# Patient Record
Sex: Male | Born: 1970
Health system: Southern US, Community
[De-identification: ages and names within clinical notes are randomized; demographics above are authoritative.]

## PROBLEM LIST (undated history)

## (undated) ENCOUNTER — Ambulatory Visit: Admission: RE | Discharge status: Absent without leave | Payer: BLUE CROSS/BLUE SHIELD | Source: Ambulatory Visit

## (undated) DIAGNOSIS — I10 Essential (primary) hypertension: Secondary | ICD-10-CM

## (undated) DIAGNOSIS — J189 Pneumonia, unspecified organism: Secondary | ICD-10-CM

## (undated) DIAGNOSIS — I219 Acute myocardial infarction, unspecified: Secondary | ICD-10-CM

## (undated) DIAGNOSIS — I2510 Atherosclerotic heart disease of native coronary artery without angina pectoris: Secondary | ICD-10-CM

## (undated) DIAGNOSIS — I639 Cerebral infarction, unspecified: Secondary | ICD-10-CM

## (undated) HISTORY — DX: Cerebral infarction, unspecified: I63.9

## (undated) HISTORY — DX: Atherosclerotic heart disease of native coronary artery without angina pectoris: I25.10

---

## 2006-01-30 ENCOUNTER — Emergency Department (HOSPITAL_COMMUNITY): Admission: EM | Admit: 2006-01-30 | Discharge: 2006-01-30 | Payer: Self-pay | Admitting: Emergency Medicine

## 2006-12-23 ENCOUNTER — Emergency Department (HOSPITAL_COMMUNITY): Admission: EM | Admit: 2006-12-23 | Discharge: 2006-12-23 | Payer: Self-pay | Admitting: Emergency Medicine

## 2007-04-04 ENCOUNTER — Emergency Department (HOSPITAL_COMMUNITY): Admission: EM | Admit: 2007-04-04 | Discharge: 2007-04-04 | Payer: Self-pay | Admitting: Emergency Medicine

## 2018-03-30 ENCOUNTER — Inpatient Hospital Stay (HOSPITAL_COMMUNITY)
Admission: EM | Admit: 2018-03-30 | Discharge: 2018-04-02 | DRG: 194 | Disposition: A | Discharge status: Home / Self Care | Payer: BLUE CROSS/BLUE SHIELD | Attending: Family Medicine | Admitting: Family Medicine

## 2018-03-30 ENCOUNTER — Encounter (HOSPITAL_COMMUNITY): Payer: Self-pay | Admitting: Emergency Medicine

## 2018-03-30 ENCOUNTER — Other Ambulatory Visit: Payer: Self-pay

## 2018-03-30 ENCOUNTER — Emergency Department (HOSPITAL_COMMUNITY): Payer: BLUE CROSS/BLUE SHIELD

## 2018-03-30 DIAGNOSIS — D649 Anemia, unspecified: Secondary | ICD-10-CM | POA: Diagnosis not present

## 2018-03-30 DIAGNOSIS — N183 Chronic kidney disease, stage 3 unspecified: Secondary | ICD-10-CM | POA: Diagnosis present

## 2018-03-30 DIAGNOSIS — E559 Vitamin D deficiency, unspecified: Secondary | ICD-10-CM | POA: Diagnosis not present

## 2018-03-30 DIAGNOSIS — N179 Acute kidney failure, unspecified: Secondary | ICD-10-CM | POA: Diagnosis present

## 2018-03-30 DIAGNOSIS — E872 Acidosis, unspecified: Secondary | ICD-10-CM | POA: Diagnosis present

## 2018-03-30 DIAGNOSIS — R809 Proteinuria, unspecified: Secondary | ICD-10-CM | POA: Diagnosis not present

## 2018-03-30 DIAGNOSIS — E860 Dehydration: Secondary | ICD-10-CM | POA: Diagnosis present

## 2018-03-30 DIAGNOSIS — R112 Nausea with vomiting, unspecified: Secondary | ICD-10-CM | POA: Diagnosis present

## 2018-03-30 DIAGNOSIS — I129 Hypertensive chronic kidney disease with stage 1 through stage 4 chronic kidney disease, or unspecified chronic kidney disease: Secondary | ICD-10-CM | POA: Diagnosis not present

## 2018-03-30 DIAGNOSIS — N184 Chronic kidney disease, stage 4 (severe): Secondary | ICD-10-CM | POA: Diagnosis present

## 2018-03-30 DIAGNOSIS — J189 Pneumonia, unspecified organism: Secondary | ICD-10-CM

## 2018-03-30 DIAGNOSIS — E871 Hypo-osmolality and hyponatremia: Secondary | ICD-10-CM | POA: Diagnosis not present

## 2018-03-30 DIAGNOSIS — J181 Lobar pneumonia, unspecified organism: Secondary | ICD-10-CM | POA: Diagnosis not present

## 2018-03-30 DIAGNOSIS — F17210 Nicotine dependence, cigarettes, uncomplicated: Secondary | ICD-10-CM | POA: Diagnosis present

## 2018-03-30 DIAGNOSIS — I10 Essential (primary) hypertension: Secondary | ICD-10-CM | POA: Diagnosis present

## 2018-03-30 LAB — COMPREHENSIVE METABOLIC PANEL
ALT: 51 U/L (ref 17–63)
ANION GAP: 18 — AB (ref 5–15)
AST: 75 U/L — ABNORMAL HIGH (ref 15–41)
Albumin: 3.5 g/dL (ref 3.5–5.0)
Alkaline Phosphatase: 81 U/L (ref 38–126)
BILIRUBIN TOTAL: 0.6 mg/dL (ref 0.3–1.2)
BUN: 66 mg/dL — ABNORMAL HIGH (ref 6–20)
CO2: 15 mmol/L — ABNORMAL LOW (ref 22–32)
Calcium: 9.2 mg/dL (ref 8.9–10.3)
Chloride: 97 mmol/L — ABNORMAL LOW (ref 101–111)
Creatinine, Ser: 5.87 mg/dL — ABNORMAL HIGH (ref 0.61–1.24)
GFR calc Af Amer: 12 mL/min — ABNORMAL LOW (ref >60)
GFR calc non Af Amer: 10 mL/min — ABNORMAL LOW (ref >60)
Glucose, Bld: 118 mg/dL — ABNORMAL HIGH (ref 65–99)
POTASSIUM: 3.7 mmol/L (ref 3.5–5.1)
SODIUM: 130 mmol/L — AB (ref 135–145)
TOTAL PROTEIN: 9.7 g/dL — AB (ref 6.5–8.1)

## 2018-03-30 LAB — LACTIC ACID, PLASMA
Lactic Acid, Venous: 1.6 mmol/L (ref 0.5–1.9)
Lactic Acid, Venous: 0.9 mmol/L (ref 0.5–1.9)

## 2018-03-30 LAB — CBC WITH DIFFERENTIAL/PLATELET
BASOS ABS: 0 10*3/uL (ref 0.0–0.1)
BASOS PCT: 0 %
EOS ABS: 0 10*3/uL (ref 0.0–0.7)
EOS PCT: 0 %
HEMATOCRIT: 43.7 % (ref 39.0–52.0)
Hemoglobin: 15.6 g/dL (ref 13.0–17.0)
Lymphocytes Relative: 16 %
Lymphs Abs: 2.6 10*3/uL (ref 0.7–4.0)
MCH: 32.5 pg (ref 26.0–34.0)
MCHC: 35.7 g/dL (ref 30.0–36.0)
MCV: 91 fL (ref 78.0–100.0)
Monocytes Absolute: 0.9 10*3/uL (ref 0.1–1.0)
Monocytes Relative: 5 %
Neutro Abs: 12.6 10*3/uL (ref 1.7–7.7)
Neutrophils Relative %: 79 %
PLATELETS: 133 10*3/uL — AB (ref 150–400)
RBC: 4.8 MIL/uL (ref 4.22–5.81)
RDW: 13.6 % (ref 11.5–15.5)
WBC: 16.1 10*3/uL — ABNORMAL HIGH (ref 4.0–10.5)

## 2018-03-30 LAB — BASIC METABOLIC PANEL
Anion gap: 14 (ref 5–15)
BUN: 68 mg/dL — ABNORMAL HIGH (ref 6–20)
CHLORIDE: 102 mmol/L (ref 101–111)
CO2: 14 mmol/L — ABNORMAL LOW (ref 22–32)
Calcium: 8.5 mg/dL — ABNORMAL LOW (ref 8.9–10.3)
Creatinine, Ser: 5.34 mg/dL — ABNORMAL HIGH (ref 0.61–1.24)
GFR calc Af Amer: 13 mL/min — ABNORMAL LOW (ref >60)
GFR calc non Af Amer: 12 mL/min — ABNORMAL LOW (ref >60)
GLUCOSE: 117 mg/dL — AB (ref 65–99)
POTASSIUM: 3.7 mmol/L (ref 3.5–5.1)
SODIUM: 130 mmol/L — AB (ref 135–145)

## 2018-03-30 LAB — BETA-HYDROXYBUTYRIC ACID: BETA-HYDROXYBUTYRIC ACID: 0.21 mmol/L (ref 0.05–0.27)

## 2018-03-30 MED ORDER — AZITHROMYCIN 250 MG PO TABS
500.0000 mg | ORAL_TABLET | Freq: Once | ORAL | Status: AC
  Administered 2018-03-30: 500 mg via ORAL
  Filled 2018-03-30: qty 2

## 2018-03-30 MED ORDER — ENOXAPARIN SODIUM 60 MG/0.6ML ~~LOC~~ SOLN
60.0000 mg | SUBCUTANEOUS | Status: DC
  Administered 2018-03-30: 60 mg via SUBCUTANEOUS
  Filled 2018-03-30: qty 0.6

## 2018-03-30 MED ORDER — METOPROLOL TARTRATE 25 MG PO TABS
25.0000 mg | ORAL_TABLET | Freq: Two times a day (BID) | ORAL | Status: DC
  Administered 2018-03-30 – 2018-03-31 (×3): 25 mg via ORAL
  Filled 2018-03-30 (×3): qty 1

## 2018-03-30 MED ORDER — PNEUMOCOCCAL VAC POLYVALENT 25 MCG/0.5ML IJ INJ
0.5000 mL | INJECTION | INTRAMUSCULAR | Status: DC
  Filled 2018-03-30: qty 0.5

## 2018-03-30 MED ORDER — SODIUM CHLORIDE 0.9 % IV BOLUS
1000.0000 mL | Freq: Once | INTRAVENOUS | Status: AC
  Administered 2018-03-30: 1000 mL via INTRAVENOUS

## 2018-03-30 MED ORDER — SODIUM CHLORIDE 0.9 % IV SOLN
1.0000 g | Freq: Once | INTRAVENOUS | Status: AC
  Administered 2018-03-30: 1 g via INTRAVENOUS
  Filled 2018-03-30: qty 10

## 2018-03-30 MED ORDER — SODIUM CHLORIDE 0.9 % IV SOLN
1.0000 g | INTRAVENOUS | Status: DC
  Administered 2018-03-31: 1 g via INTRAVENOUS
  Filled 2018-03-30 (×2): qty 10
  Filled 2018-03-30: qty 1

## 2018-03-30 MED ORDER — SODIUM CHLORIDE 0.9 % IV SOLN
500.0000 mg | INTRAVENOUS | Status: DC
  Administered 2018-03-31: 500 mg via INTRAVENOUS
  Filled 2018-03-30 (×3): qty 500

## 2018-03-30 MED ORDER — SODIUM CHLORIDE 0.9 % IV SOLN
INTRAVENOUS | Status: DC
  Administered 2018-03-30 – 2018-04-02 (×6): via INTRAVENOUS

## 2018-03-30 MED ORDER — METOPROLOL TARTRATE 25 MG PO TABS: 25.0000 mg | ORAL_TABLET | Freq: Two times a day (BID) | ORAL | Status: DC

## 2018-03-30 NOTE — H&P (Signed)
History and Physical  Zachary Mccann:[id number] DOB: August 20, 1971 DOA: 03/30/2018  Referring physician: Dr Aileen Pilot, ED physician PCP: Patient, No Pcp Per  Outpatient Specialists: none  Patient Coming From: home  Chief Complaint: cough, fever  HPI: Zachary Mccann is a 47 y.o. male with a history of hypertension, although he has not been to see a physician and has not been treated for this.  Patient presents with 4 days of worsening cough, fever, with intermittent nausea vomiting and diarrhea.  He has been able to keep very little down.  Eating has been exacerbating his nausea and vomiting.  He has been able to tolerate some sips of fluids earlier today.  No other palliating or provoking factors.  Symptoms are worsening.  Emergency Department Course: Patient received IV antibiotics: Ceftriaxone and azithromycin for pneumonia evident on chest x-ray: Left upper and left lower lobe pneumonias.  White count was 16,000.  Creatinine 5.87 bicarb 15 anion gap 18.  Review of Systems:   Pt denies any constipation, abdominal pain, shortness of breath, dyspnea on exertion, orthopnea, wheezing, palpitations, headache, vision changes, lightheadedness, dizziness, melena, rectal bleeding.  Review of systems are otherwise negative  History reviewed. No pertinent past medical history. History reviewed. No pertinent surgical history. Social History:  reports that he has been smoking cigarettes.  He has been smoking about 0.50 packs per day. He has never used smokeless tobacco. He reports that he drinks alcohol. He reports that he has current or past drug history. Patient lives at home  No Known Allergies  History reviewed. No pertinent family history.  Patient is unaware of any family history  Prior to Admission medications   Medication Sig Start Date End Date Taking? Authorizing Provider  ibuprofen (ADVIL,MOTRIN) 200 MG tablet Take 400 mg by mouth daily as needed for moderate pain.   Yes [provider]    Physical Exam: BP (!) 167/113   Pulse 100   Temp 99.5 F (37.5 C) (Oral)   Resp (!) 25   Ht 6' (1.829 m)   Wt 127 kg (280 lb)   SpO2 95%   BMI 37.97 kg/m   . General: Middle-aged black male. Awake and alert and oriented x3. No acute cardiopulmonary distress.  [name] HEENT: Normocephalic atraumatic.  Right and left ears normal in appearance.  Pupils equal, round, reactive to light. Extraocular muscles are intact. Sclerae anicteric and noninjected.  Moist mucosal membranes. No mucosal lesions.  . Neck: Neck supple without lymphadenopathy. No carotid bruits. No masses palpated.  . Cardiovascular: Regular rate with normal S1-S2 sounds. No murmurs, rubs, gallops auscultated. No JVD.  [name] Respiratory: Rales with egophony in the left upper and lower lobes.  No accessory muscle use. . Abdomen: Soft, nontender, nondistended. Active bowel sounds. No masses or hepatosplenomegaly  . Skin: No rashes, lesions, or ulcerations.  Dry, warm to touch. 2+ dorsalis pedis and radial pulses. . Musculoskeletal: No calf or leg pain. All major joints not erythematous nontender.  No upper or lower joint deformation.  Good ROM.  No contractures  . Psychiatric: Intact judgment and insight. Pleasant and cooperative. . Neurologic: No focal neurological deficits. Strength is 5/5 and symmetric in upper and lower extremities.  Cranial nerves II through XII are grossly intact.           Labs on Admission: I have personally reviewed following labs and imaging studies  CBC: Recent Labs  Lab 03/30/18 1346  WBC 16.1*  NEUTROABS 12.6  HGB 15.6  HCT 43.7  MCV 91.0  PLT 133*   Basic Metabolic Panel: Recent Labs  Lab 03/30/18 1346  NA 130*  K 3.7  CL 97*  CO2 15*  GLUCOSE 118*  BUN 66*  CREATININE 5.87*  CALCIUM 9.2   GFR: Estimated Creatinine Clearance: 21.4 mL/min (A) (by C-G formula based on SCr of 5.87 mg/dL (H)). Liver Function Tests: Recent Labs  Lab 03/30/18 1346  AST 75*  ALT 51   ALKPHOS 81  BILITOT 0.6  PROT 9.7*  ALBUMIN 3.5   No results for input(s): LIPASE, AMYLASE in the last 168 hours. No results for input(s): AMMONIA in the last 168 hours. Coagulation Profile: No results for input(s): INR, PROTIME in the last 168 hours. Cardiac Enzymes: No results for input(s): CKTOTAL, CKMB, CKMBINDEX, TROPONINI in the last 168 hours. BNP (last 3 results) No results for input(s): PROBNP in the last 8760 hours. HbA1C: No results for input(s): HGBA1C in the last 72 hours. CBG: No results for input(s): GLUCAP in the last 168 hours. Lipid Profile: No results for input(s): CHOL, HDL, LDLCALC, TRIG, CHOLHDL, LDLDIRECT in the last 72 hours. Thyroid Function Tests: No results for input(s): TSH, T4TOTAL, FREET4, T3FREE, THYROIDAB in the last 72 hours. Anemia Panel: No results for input(s): VITAMINB12, FOLATE, FERRITIN, TIBC, IRON, RETICCTPCT in the last 72 hours. Urine analysis: No results found for: COLORURINE, APPEARANCEUR, LABSPEC, PHURINE, GLUCOSEU, HGBUR, BILIRUBINUR, KETONESUR, PROTEINUR, UROBILINOGEN, NITRITE, LEUKOCYTESUR Sepsis Labs: @LABRCNTIP (procalcitonin:4,lacticidven:4) )No results found for this or any previous visit (from the past 240 hour(s)).   Radiological Exams on Admission: Dg Chest 2 View  Result Date: 03/30/2018 CLINICAL DATA:  Shortness of breath, generalized body aches, nausea, vomiting, diarrhea and RIGHT arm pain since Wednesday, syncopal episode this morning, smoker EXAM: CHEST - 2 VIEW COMPARISON:  None FINDINGS: Enlargement of cardiac silhouette. Mediastinal contours and pulmonary vascularity normal. LEFT upper lobe and LEFT lower lobe infiltrates consistent with pneumonia. RIGHT lung clear. No pleural effusion or pneumothorax. Osseous structures unremarkable. IMPRESSION: LEFT upper and LEFT lower lobe infiltrate consistent with pneumonia. Due to the focal dense nature of the LEFT infrahilar infiltrate, follow-up radiographs recommended in 2-3  weeks following treatment to ensure resolution and exclude underlying abnormalities including tumor. Electronically Signed   By: Ulyses Southward M.D.   On: 03/30/2018 13:24    EKG: Independently reviewed.  Sinus tachycardia.  Biatrial enlargement.  Left axis deviation.  Assessment/Plan: Principal Problem:   Pneumonia Active Problems:   Metabolic acidosis   AKI (acute kidney injury) (HCC)   N&V (nausea and vomiting)   Essential hypertension    This patient was discussed with the ED physician, including pertinent vitals, physical exam findings, labs, and imaging.  We also discussed care given by the ED provider.  1. Community-acquired pneumonia Antibiotics: Ceftriaxone and azithromycin Robitussin Blood cultures drawn in the emergency department Sputum cultures CBC tomorrow Strep antigen by urine 2. Metabolic acidosis (Gapped) a. Uncertain etiology at this time. b. Check lactic acid and beta hydroxybutyric acid c. Check urine for ketones d. IV hydration: We will give patient second IV bolus (patient received first bolus in the ED) e. Continue maintenance fluids 3. Acute kidney injury a. Question acute versus acute on chronic b. Recheck creatinine in the morning after hydration as above 4. Nausea and vomiting a. Antiemetics available 5. Hypertension a. Metoprolol  DVT prophylaxis: Lovenox Consultants: None Code Status: Full code Family Communication: Interview and exam done in presence of brother Disposition Plan: Patient to return home following improvement of acute kidney injury  and control of symptoms   Noralee Chars [facility] Pager [phone]  If 7PM-7AM, please contact night-coverage [email or web address] Password [email or web address]

## 2018-03-30 NOTE — ED Notes (Signed)
EDP at bedside  

## 2018-03-30 NOTE — ED Triage Notes (Signed)
Patient c/o shortness of breath, generalized body aches, nausea, vomiting, diarrhea, and pain in right arm. Patient states started Wednesday when he woke up. Patient also states that he had a syncopal episode this morning at 3 am while trying to have a BM.

## 2018-03-30 NOTE — ED Notes (Signed)
EDP at bedside updating patient and family. 

## 2018-03-30 NOTE — ED Provider Notes (Signed)
[facility] EMERGENCY DEPARTMENT Provider Note   CSN: [id number] Arrival date & time: 03/30/18  1253     History   Chief Complaint Chief Complaint  Patient presents with  . Shortness of Breath    HPI Zachary Mccann is a 47 y.o. male.  Patient complains of cough and shortness of breath.  This been going on for a few days.  The history is provided by the patient. No language interpreter was used.  Shortness of Breath  This is a new problem. The problem occurs continuously.The current episode started more than 2 days ago. The problem has not changed since onset.Associated symptoms include a fever. Pertinent negatives include no headaches, no cough, no chest pain, no abdominal pain and no rash. It is unknown what precipitated the problem. Risk factors: None.    History reviewed. No pertinent past medical history.  There are no active problems to display for this patient.   History reviewed. No pertinent surgical history.      Home Medications    Prior to Admission medications   Not on File    Family History History reviewed. No pertinent family history.  Social History Social History   Tobacco Use  . Smoking status: Current Every Day Smoker    Packs/day: 0.50    Types: Cigarettes  . Smokeless tobacco: Never Used  Substance Use Topics  . Alcohol use: Yes  . Drug use: Yes     Allergies   Patient has no known allergies.   Review of Systems Review of Systems  Constitutional: Positive for fever. Negative for appetite change and fatigue.  HENT: Negative for congestion, ear discharge and sinus pressure.   Eyes: Negative for discharge.  Respiratory: Positive for shortness of breath. Negative for cough.   Cardiovascular: Negative for chest pain.  Gastrointestinal: Negative for abdominal pain and diarrhea.  Genitourinary: Negative for frequency and hematuria.  Musculoskeletal: Negative for back pain.  Skin: Negative for rash.  Neurological: Negative for  seizures and headaches.  Psychiatric/Behavioral: Negative for hallucinations.     Physical Exam Updated Vital Signs BP (!) 167/113   Pulse 100   Temp 99.5 F (37.5 C) (Oral)   Resp (!) 25   Ht 6' (1.829 m)   Wt 127 kg (280 lb)   SpO2 95%   BMI 37.97 kg/m   Physical Exam  Constitutional: He is oriented to person, place, and time. He appears well-developed.  HENT:  Head: Normocephalic.  Eyes: Conjunctivae and EOM are normal. No scleral icterus.  Neck: Neck supple. No thyromegaly present.  Cardiovascular: Normal rate and regular rhythm. Exam reveals no gallop and no friction rub.  No murmur heard. Pulmonary/Chest: No stridor. He has no wheezes. He has no rales. He exhibits no tenderness.  Abdominal: He exhibits no distension. There is no tenderness. There is no rebound.  Musculoskeletal: Normal range of motion. He exhibits no edema.  Lymphadenopathy:    He has no cervical adenopathy.  Neurological: He is oriented to person, place, and time. He exhibits normal muscle tone. Coordination normal.  Skin: No rash noted. No erythema.  Psychiatric: He has a normal mood and affect. His behavior is normal.     ED Treatments / Results  Labs (all labs ordered are listed, but only abnormal results are displayed) Labs Reviewed  CBC WITH DIFFERENTIAL/PLATELET - Abnormal; Notable for the following components:      Result Value   WBC 16.1 (*)    Platelets 133 (*)    All other  components within normal limits  COMPREHENSIVE METABOLIC PANEL - Abnormal; Notable for the following components:   Sodium 130 (*)    Chloride 97 (*)    CO2 15 (*)    Glucose, Bld 118 (*)    BUN 66 (*)    Creatinine, Ser 5.87 (*)    Total Protein 9.7 (*)    AST 75 (*)    GFR calc non Af Amer 10 (*)    GFR calc Af Amer 12 (*)    Anion gap 18 (*)    All other components within normal limits  LACTIC ACID, PLASMA  LACTIC ACID, PLASMA  BETA-HYDROXYBUTYRIC ACID    EKG EKG  Interpretation  Date/Time:  Sunday March 30 2018 12:58:45 EDT Ventricular Rate:  102 PR Interval:  156 QRS Duration: 102 QT Interval:  346 QTC Calculation: 450 R Axis:   -35 Text Interpretation:  Sinus tachycardia Biatrial enlargement Left axis deviation Abnormal ECG Confirmed by Bethann Berkshire [phone]) on 03/30/2018 6:27:52 PM   Radiology Dg Chest 2 View  Result Date: 03/30/2018 CLINICAL DATA:  Shortness of breath, generalized body aches, nausea, vomiting, diarrhea and RIGHT arm pain since Wednesday, syncopal episode this morning, smoker EXAM: CHEST - 2 VIEW COMPARISON:  None FINDINGS: Enlargement of cardiac silhouette. Mediastinal contours and pulmonary vascularity normal. LEFT upper lobe and LEFT lower lobe infiltrates consistent with pneumonia. RIGHT lung clear. No pleural effusion or pneumothorax. Osseous structures unremarkable. IMPRESSION: LEFT upper and LEFT lower lobe infiltrate consistent with pneumonia. Due to the focal dense nature of the LEFT infrahilar infiltrate, follow-up radiographs recommended in 2-3 weeks following treatment to ensure resolution and exclude underlying abnormalities including tumor. Electronically Signed   By: Ulyses Southward M.D.   On: 03/30/2018 13:24    Procedures Procedures (including critical care time)  Medications Ordered in ED Medications  sodium chloride 0.9 % bolus 1,000 mL (has no administration in time range)  cefTRIAXone (ROCEPHIN) 1 g in sodium chloride 0.9 % 100 mL IVPB (0 g Intravenous Stopped 03/30/18 1442)  azithromycin (ZITHROMAX) tablet 500 mg (500 mg Oral Given 03/30/18 1350)  sodium chloride 0.9 % bolus 1,000 mL (0 mLs Intravenous Stopped 03/30/18 1536)     Initial Impression / Assessment and Plan / ED Course  I have reviewed the triage vital signs and the nursing notes.  Pertinent labs & imaging results that were available during my care of the patient were reviewed by me and considered in my medical decision making (see chart for  details).     Patient has pneumonia along with renal failure and high blood pressure.  He will be admitted to medicine  Final Clinical Impressions(s) / ED Diagnoses   Final diagnoses:  Community acquired pneumonia of left lower lobe of lung [facility])    ED Discharge Orders    None       Bethann Berkshire, MD 03/30/18 [phone]

## 2018-03-31 ENCOUNTER — Inpatient Hospital Stay (HOSPITAL_COMMUNITY): Payer: BLUE CROSS/BLUE SHIELD

## 2018-03-31 LAB — URINALYSIS, ROUTINE W REFLEX MICROSCOPIC
Bilirubin Urine: NEGATIVE
GLUCOSE, UA: NEGATIVE mg/dL
Ketones, ur: NEGATIVE mg/dL
Leukocytes, UA: NEGATIVE
NITRITE: NEGATIVE
Protein, ur: 30 mg/dL — AB
SPECIFIC GRAVITY, URINE: 1.01 (ref 1.005–1.030)
pH: 5 (ref 5.0–8.0)

## 2018-03-31 LAB — BASIC METABOLIC PANEL
Anion gap: 14 (ref 5–15)
BUN: 69 mg/dL — AB (ref 6–20)
CO2: 14 mmol/L — ABNORMAL LOW (ref 22–32)
Calcium: 8.4 mg/dL — ABNORMAL LOW (ref 8.9–10.3)
Chloride: 103 mmol/L (ref 101–111)
Creatinine, Ser: 5.23 mg/dL — ABNORMAL HIGH (ref 0.61–1.24)
GFR calc Af Amer: 14 mL/min — ABNORMAL LOW (ref >60)
GFR, EST NON AFRICAN AMERICAN: 12 mL/min — AB (ref >60)
GLUCOSE: 110 mg/dL — AB (ref 65–99)
Potassium: 3.7 mmol/L (ref 3.5–5.1)
Sodium: 131 mmol/L — ABNORMAL LOW (ref 135–145)

## 2018-03-31 LAB — CBC
HEMATOCRIT: 38.3 % — AB (ref 39.0–52.0)
Hemoglobin: 13.3 g/dL (ref 13.0–17.0)
MCH: 31.2 pg (ref 26.0–34.0)
MCHC: 34.7 g/dL (ref 30.0–36.0)
MCV: 89.9 fL (ref 78.0–100.0)
Platelets: 121 10*3/uL — ABNORMAL LOW (ref 150–400)
RBC: 4.26 MIL/uL (ref 4.22–5.81)
RDW: 13.9 % (ref 11.5–15.5)
WBC: 11.8 10*3/uL — ABNORMAL HIGH (ref 4.0–10.5)

## 2018-03-31 MED ORDER — GUAIFENESIN ER 600 MG PO TB12
1200.0000 mg | ORAL_TABLET | Freq: Two times a day (BID) | ORAL | Status: DC
  Administered 2018-03-31 – 2018-04-02 (×3): 1200 mg via ORAL
  Filled 2018-03-31 (×4): qty 2

## 2018-03-31 MED ORDER — ENOXAPARIN SODIUM 30 MG/0.3ML ~~LOC~~ SOLN
30.0000 mg | SUBCUTANEOUS | Status: DC
  Administered 2018-03-31: 30 mg via SUBCUTANEOUS
  Filled 2018-03-31: qty 0.3

## 2018-03-31 NOTE — Progress Notes (Signed)
PROGRESS NOTE    Zachary Mccann  JXB:[id number] DOB: 17-Sep-1971 DOA: 03/30/2018 PCP: Patient, No Pcp Per    Brief Narrative:  47 year old male who does not have any prior records in our system, presents to the hospital with cough and fever.  Found to have continued acquired pneumonia and elevated creatinine.  Unclear if he has chronic kidney disease.  Admitted to the hospital for treatment of pneumonia and renal failure.  Overall pneumonia is better with antibiotics.  Nephrology consulted to assist with management of renal failure.   Assessment & Plan:   Principal Problem:   Pneumonia Active Problems:   Metabolic acidosis   AKI (acute kidney injury) (HCC)   N&V (nausea and vomiting)   Essential hypertension   1. Community-acquired pneumonia.  On ceftriaxone and azithromycin.  Blood cultures are showing no growth today.  Overall respiratory status appears to be improving.  He is on room air without any shortness of breath.  Continue current treatments. 2. Acute kidney injury.  Unclear if he has an element of chronic kidney disease.  Renal ultrasound has been ordered.  He reports that he is not had any blood work done in several years.  Will consult nephrology.  He has had mild improvement of creatinine since admission with fluids.  Recheck labs in a.m. 3. Hypertension.  Continue on metoprolol. 4. Metabolic acidosis.  Possibly related to renal failure.  May need to start bicarb supplementation.  Continue with hydration for now.  Await nephrology input.   DVT prophylaxis: Lovenox Code Status: Full code Family Communication: No family present Disposition Plan: Discharge home once renal function is better.   Consultants:     Procedures:     Antimicrobials:   Ceftriaxone 4/14 >  Azithromycin 4/14 >   Subjective: Feeling better today.  Breathing is improving.  Minimal cough.  Objective: Vitals:   03/30/18 2036 03/31/18 0533 03/31/18 0933 03/31/18 1133  BP:  (!) 148/90  (!) 154/93   Pulse:  75    Resp:  20    Temp:  98.7 F (37.1 C)    TempSrc:  Oral    SpO2: 95% 100%  97%  Weight:      Height:        Intake/Output Summary (Last 24 hours) at 03/31/2018 1952 Last data filed at 03/31/2018 1700 Gross per 24 hour  Intake 1512.08 ml  Output 300 ml  Net 1212.08 ml   Filed Weights   03/30/18 1302 03/30/18 2026  Weight: 127 kg (280 lb) 120.1 kg (264 lb 12.4 oz)    Examination:  General exam: Appears calm and comfortable  Respiratory system: Clear to auscultation. Respiratory effort normal. Cardiovascular system: S1 & S2 heard, RRR. No JVD, murmurs, rubs, gallops or clicks. No pedal edema. Gastrointestinal system: Abdomen is nondistended, soft and nontender. No organomegaly or masses felt. Normal bowel sounds heard. Central nervous system: Alert and oriented. No focal neurological deficits. Extremities: Symmetric 5 x 5 power. Skin: No rashes, lesions or ulcers Psychiatry: Judgement and insight appear normal. Mood & affect appropriate.     Data Reviewed: I have personally reviewed following labs and imaging studies  CBC: Recent Labs  Lab 03/30/18 1346 03/31/18 0629  WBC 16.1* 11.8*  NEUTROABS 12.6  --   HGB 15.6 13.3  HCT 43.7 38.3*  MCV 91.0 89.9  PLT 133* 121*   Basic Metabolic Panel: Recent Labs  Lab 03/30/18 1346 03/30/18 2301 03/31/18 0629  NA 130* 130* 131*  K 3.7 3.7 3.7  CL 97* 102 103  CO2 15* 14* 14*  GLUCOSE 118* 117* 110*  BUN 66* 68* 69*  CREATININE 5.87* 5.34* 5.23*  CALCIUM 9.2 8.5* 8.4*   GFR: Estimated Creatinine Clearance: 23.4 mL/min (A) (by C-G formula based on SCr of 5.23 mg/dL (H)). Liver Function Tests: Recent Labs  Lab 03/30/18 1346  AST 75*  ALT 51  ALKPHOS 81  BILITOT 0.6  PROT 9.7*  ALBUMIN 3.5   No results for input(s): LIPASE, AMYLASE in the last 168 hours. No results for input(s): AMMONIA in the last 168 hours. Coagulation Profile: No results for input(s): INR, PROTIME in the last 168  hours. Cardiac Enzymes: No results for input(s): CKTOTAL, CKMB, CKMBINDEX, TROPONINI in the last 168 hours. BNP (last 3 results) No results for input(s): PROBNP in the last 8760 hours. HbA1C: No results for input(s): HGBA1C in the last 72 hours. CBG: No results for input(s): GLUCAP in the last 168 hours. Lipid Profile: No results for input(s): CHOL, HDL, LDLCALC, TRIG, CHOLHDL, LDLDIRECT in the last 72 hours. Thyroid Function Tests: No results for input(s): TSH, T4TOTAL, FREET4, T3FREE, THYROIDAB in the last 72 hours. Anemia Panel: No results for input(s): VITAMINB12, FOLATE, FERRITIN, TIBC, IRON, RETICCTPCT in the last 72 hours. Sepsis Labs: Recent Labs  Lab 03/30/18 1925 03/30/18 2301  LATICACIDVEN 1.6 0.9    Recent Results (from the past 240 hour(s))  Culture, blood (routine x 2) Call MD if unable to obtain prior to antibiotics being given     Status: None (Preliminary result)   Collection Time: 03/30/18 11:01 PM  Result Value Ref Range Status   Specimen Description BLOOD LEFT ARM  Final   Special Requests   Final    BOTTLES DRAWN AEROBIC AND ANAEROBIC Blood Culture adequate volume   Culture   Final    NO GROWTH < 12 HOURS Performed at [facility] [place], [address], [place], [place] [address]    Report Status PENDING  Incomplete  Culture, blood (routine x 2) Call MD if unable to obtain prior to antibiotics being given     Status: None (Preliminary result)   Collection Time: 03/30/18 11:03 PM  Result Value Ref Range Status   Specimen Description BLOOD RIGHT FOREARM  Final   Special Requests   Final    BOTTLES DRAWN AEROBIC AND ANAEROBIC Blood Culture adequate volume   Culture   Final    NO GROWTH < 12 HOURS Performed at [facility], [address] [place], [place] [address]    Report Status PENDING  Incomplete         Radiology Studies: Dg Chest 2 View  Result Date: 03/30/2018 CLINICAL DATA:  Shortness of breath, generalized body aches, nausea, vomiting,  diarrhea and RIGHT arm pain since Wednesday, syncopal episode this morning, smoker EXAM: CHEST - 2 VIEW COMPARISON:  None FINDINGS: Enlargement of cardiac silhouette. Mediastinal contours and pulmonary vascularity normal. LEFT upper lobe and LEFT lower lobe infiltrates consistent with pneumonia. RIGHT lung clear. No pleural effusion or pneumothorax. Osseous structures unremarkable. IMPRESSION: LEFT upper and LEFT lower lobe infiltrate consistent with pneumonia. Due to the focal dense nature of the LEFT infrahilar infiltrate, follow-up radiographs recommended in 2-3 weeks following treatment to ensure resolution and exclude underlying abnormalities including tumor. Electronically Signed   By: Ulyses Southward M.D.   On: 03/30/2018 13:24   US Renal  Result Date: 03/31/2018 CLINICAL DATA:  47 year old hypertensive male with acute kidney insufficiency. Initial encounter. EXAM: RENAL / URINARY TRACT ULTRASOUND COMPLETE COMPARISON:  None. FINDINGS: Right Kidney: Length: 11.5 cm. Minimal increased echogenicity. No mass or hydronephrosis visualized. Left Kidney: Length: 11.1 cm. Minimal increased echogenicity. No mass or hydronephrosis visualized. Bladder: Appears normal for degree of bladder distention. IMPRESSION: Minimal increased echogenicity of the kidneys possibly representing changes of medical renal disease. No hydronephrosis. Electronically Signed   By: Lacy Duverney M.D.   On: 03/31/2018 13:25        Scheduled Meds: . enoxaparin (LOVENOX) injection  30 mg Subcutaneous Q24H  . guaiFENesin  1,200 mg Oral BID  . metoprolol tartrate  25 mg Oral BID  . pneumococcal 23 valent vaccine  0.5 mL Intramuscular Tomorrow-1000   Continuous Infusions: . sodium chloride 125 mL/hr at 03/31/18 0929  . azithromycin Stopped (03/31/18 1643)  . cefTRIAXone (ROCEPHIN)  IV Stopped (03/31/18 1753)     LOS: 1 day    Time spent:    Erick Blinks, MD [facility] Pager [phone]  If 7PM-7AM,  please contact night-coverage [email or web address] Password [facility] 03/31/2018, 7:52 PM

## 2018-04-01 LAB — RENAL FUNCTION PANEL
ALBUMIN: 2.5 g/dL — AB (ref 3.5–5.0)
ANION GAP: 14 (ref 5–15)
BUN: 60 mg/dL — ABNORMAL HIGH (ref 6–20)
CALCIUM: 8.3 mg/dL — AB (ref 8.9–10.3)
CO2: 14 mmol/L — ABNORMAL LOW (ref 22–32)
Chloride: 105 mmol/L (ref 101–111)
Creatinine, Ser: 3.96 mg/dL — ABNORMAL HIGH (ref 0.61–1.24)
GFR calc non Af Amer: 17 mL/min — ABNORMAL LOW (ref >60)
GFR, EST AFRICAN AMERICAN: 19 mL/min — AB (ref >60)
Glucose, Bld: 110 mg/dL — ABNORMAL HIGH (ref 65–99)
PHOSPHORUS: 4.3 mg/dL (ref 2.5–4.6)
Potassium: 3.5 mmol/L (ref 3.5–5.1)
SODIUM: 133 mmol/L — AB (ref 135–145)

## 2018-04-01 LAB — CBC
HCT: 35.7 % — ABNORMAL LOW (ref 39.0–52.0)
Hemoglobin: 12.2 g/dL — ABNORMAL LOW (ref 13.0–17.0)
MCH: 31 pg (ref 26.0–34.0)
MCHC: 34.2 g/dL (ref 30.0–36.0)
MCV: 90.8 fL (ref 78.0–100.0)
Platelets: 127 K/uL — ABNORMAL LOW (ref 150–400)
RBC: 3.93 MIL/uL — ABNORMAL LOW (ref 4.22–5.81)
RDW: 13.9 % (ref 11.5–15.5)
WBC: 9.2 K/uL (ref 4.0–10.5)

## 2018-04-01 LAB — HEMOGLOBIN A1C
HEMOGLOBIN A1C: 5.5 % (ref 4.8–5.6)
MEAN PLASMA GLUCOSE: 111.15 mg/dL

## 2018-04-01 LAB — HIV ANTIBODY (ROUTINE TESTING W REFLEX): HIV Screen 4th Generation wRfx: NONREACTIVE

## 2018-04-01 LAB — TSH: TSH: 2.024 u[IU]/mL (ref 0.350–4.500)

## 2018-04-01 MED ORDER — PANTOPRAZOLE SODIUM 40 MG PO TBEC
40.0000 mg | DELAYED_RELEASE_TABLET | Freq: Every day | ORAL | Status: DC
  Administered 2018-04-01 – 2018-04-02 (×2): 40 mg via ORAL
  Filled 2018-04-01 (×2): qty 1

## 2018-04-01 MED ORDER — AMLODIPINE BESYLATE 5 MG PO TABS
5.0000 mg | ORAL_TABLET | Freq: Every day | ORAL | Status: DC
Start: 2018-04-01 — End: 2018-04-02
  Administered 2018-04-01 – 2018-04-02 (×2): 5 mg via ORAL
  Filled 2018-04-01 (×2): qty 1

## 2018-04-01 MED ORDER — METOPROLOL TARTRATE 50 MG PO TABS
50.0000 mg | ORAL_TABLET | Freq: Two times a day (BID) | ORAL | Status: DC
  Administered 2018-04-01 – 2018-04-02 (×3): 50 mg via ORAL
  Filled 2018-04-01 (×3): qty 1

## 2018-04-01 MED ORDER — HYDRALAZINE HCL 10 MG PO TABS
10.0000 mg | ORAL_TABLET | Freq: Three times a day (TID) | ORAL | Status: DC
  Administered 2018-04-01 – 2018-04-02 (×3): 10 mg via ORAL
  Filled 2018-04-01 (×3): qty 1

## 2018-04-01 MED ORDER — DOXYCYCLINE HYCLATE 100 MG PO TABS
100.0000 mg | ORAL_TABLET | Freq: Two times a day (BID) | ORAL | Status: DC
  Administered 2018-04-01 – 2018-04-02 (×3): 100 mg via ORAL
  Filled 2018-04-01 (×3): qty 1

## 2018-04-01 MED ORDER — ENOXAPARIN SODIUM 40 MG/0.4ML ~~LOC~~ SOLN
40.0000 mg | SUBCUTANEOUS | Status: DC
  Administered 2018-04-01: 40 mg via SUBCUTANEOUS
  Filled 2018-04-01: qty 0.4

## 2018-04-01 NOTE — Consult Note (Signed)
Reason for Consult: Renal failure Referring Physician: Dr. Beverley Fiedler is an 47 y.o. male.  HPI: He is a patient who has no significant past medical history presently came with history of cough, fever and some nausea.  When he was evaluated he was found to have elevated BUN and creatinine.  Presently patient denies any previous history of hypertension, renal failure, kidney stone, urinary tract infection.  Patient also denies taking any NSAIDS except occasionally 1 or 2 tablets a week.  He denies also any leg swelling.  Patient does not have any primary care physician and no recent blood work.  Presently he is feeling much better.  He denies any cough, fever, chills or sweating.  Denies also any nausea or vomiting.  History reviewed. No pertinent past medical history.  History reviewed. No pertinent surgical history.  History reviewed. No pertinent family history.  Social History:  reports that he has been smoking cigarettes.  He has been smoking about 0.50 packs per day. He has never used smokeless tobacco. He reports that he drinks alcohol. He reports that he has current or past drug history.  Allergies: No Known Allergies  Medications: I have reviewed the patient's current medications.  Results for orders placed or performed during the hospital encounter of 03/30/18 (from the past 48 hour(s))  CBC with Differential     Status: Abnormal   Collection Time: 03/30/18  1:46 PM  Result Value Ref Range   WBC 16.1 (H) 4.0 - 10.5 K/uL   RBC 4.80 4.22 - 5.81 MIL/uL   Hemoglobin 15.6 13.0 - 17.0 g/dL   HCT 43.7 39.0 - 52.0 %   MCV 91.0 78.0 - 100.0 fL   MCH 32.5 26.0 - 34.0 pg   MCHC 35.7 30.0 - 36.0 g/dL   RDW 13.6 11.5 - 15.5 %   Platelets 133 (L) 150 - 400 K/uL   Neutrophils Relative % 79 %   Neutro Abs 12.6 1.7 - 7.7 K/uL   Lymphocytes Relative 16 %   Lymphs Abs 2.6 0.7 - 4.0 K/uL   Monocytes Relative 5 %   Monocytes Absolute 0.9 0.1 - 1.0 K/uL   Eosinophils Relative 0  %   Eosinophils Absolute 0.0 0.0 - 0.7 K/uL   Basophils Relative 0 %   Basophils Absolute 0.0 0.0 - 0.1 K/uL   WBC Morphology ATYPICAL LYMPHOCYTES     Comment: Performed at [facility], [address], [place], [place] [address]  Comprehensive metabolic panel     Status: Abnormal   Collection Time: 03/30/18  1:46 PM  Result Value Ref Range   Sodium 130 (L) 135 - 145 mmol/L   Potassium 3.7 3.5 - 5.1 mmol/L   Chloride 97 (L) 101 - 111 mmol/L   CO2 15 (L) 22 - 32 mmol/L   Glucose, Bld 118 (H) 65 - 99 mg/dL   BUN 66 (H) 6 - 20 mg/dL   Creatinine, Ser 5.87 (H) 0.61 - 1.24 mg/dL   Calcium 9.2 8.9 - 10.3 mg/dL   Total Protein 9.7 (H) 6.5 - 8.1 g/dL   Albumin 3.5 3.5 - 5.0 g/dL   AST 75 (H) 15 - 41 U/L   ALT 51 17 - 63 U/L   Alkaline Phosphatase 81 38 - 126 U/L   Total Bilirubin 0.6 0.3 - 1.2 mg/dL   GFR calc non Af Amer 10 (L) >60 mL/min   GFR calc Af Amer 12 (L) >60 mL/min    Comment: (NOTE) The eGFR has  been calculated using the CKD EPI equation. This calculation has not been validated in all clinical situations. eGFR's persistently <60 mL/min signify possible Chronic Kidney Disease.    Anion gap 18 (H) 5 - 15    Comment: Performed at [facility], [address], [place], [place] [address]  Lactic acid, plasma     Status: None   Collection Time: 03/30/18  7:25 PM  Result Value Ref Range   Lactic Acid, Venous 1.6 0.5 - 1.9 mmol/L    Comment: Performed at [facility], [address], [place], [place] [address]  Beta-hydroxybutyric acid     Status: None   Collection Time: 03/30/18  7:25 PM  Result Value Ref Range   Beta-Hydroxybutyric Acid 0.21 0.05 - 0.27 mmol/L    Comment: Performed at [facility], [address], [place], [place] [address]  Lactic acid, plasma     Status: None   Collection Time: 03/30/18 11:01 PM  Result Value Ref Range   Lactic Acid, Venous 0.9 0.5 - 1.9 mmol/L    Comment: Performed at [facility], [address] [place], [place] [address]  Culture, blood  (routine x 2) Call MD if unable to obtain prior to antibiotics being given     Status: None (Preliminary result)   Collection Time: 03/30/18 11:01 PM  Result Value Ref Range   Specimen Description BLOOD LEFT ARM    Special Requests      BOTTLES DRAWN AEROBIC AND ANAEROBIC Blood Culture adequate volume   Culture      NO GROWTH 2 DAYS Performed at [facility], [address] [place], [place] [address]    Report Status PENDING   HIV antibody (Routine Screening)     Status: None   Collection Time: 03/30/18 11:01 PM  Result Value Ref Range   HIV Screen 4th Generation wRfx Non Reactive Non Reactive    Comment: (NOTE) Performed At: [facility] [place], [place] [id number] Rush Farmer MD [id number] Performed at [facility], [address] [place], [place] [address]   Basic metabolic panel     Status: Abnormal   Collection Time: 03/30/18 11:01 PM  Result Value Ref Range   Sodium 130 (L) 135 - 145 mmol/L   Potassium 3.7 3.5 - 5.1 mmol/L   Chloride 102 101 - 111 mmol/L   CO2 14 (L) 22 - 32 mmol/L   Glucose, Bld 117 (H) 65 - 99 mg/dL   BUN 68 (H) 6 - 20 mg/dL   Creatinine, Ser 5.34 (H) 0.61 - 1.24 mg/dL   Calcium 8.5 (L) 8.9 - 10.3 mg/dL   GFR calc non Af Amer 12 (L) >60 mL/min   GFR calc Af Amer 13 (L) >60 mL/min    Comment: (NOTE) The eGFR has been calculated using the CKD EPI equation. This calculation has not been validated in all clinical situations. eGFR's persistently <60 mL/min signify possible Chronic Kidney Disease.    Anion gap 14 5 - 15    Comment: Performed at [facility], [address], [place], [place] [address]  Culture, blood (routine x 2) Call MD if unable to obtain prior to antibiotics being given     Status: None (Preliminary result)   Collection Time: 03/30/18 11:03 PM  Result Value Ref Range   Specimen Description BLOOD RIGHT FOREARM    Special Requests      BOTTLES DRAWN AEROBIC AND ANAEROBIC Blood Culture adequate volume    Culture      NO GROWTH 2  DAYS Performed at [facility], [address], [place], [place] [address]    Report Status PENDING   Urinalysis, Routine w reflex microscopic     Status: Abnormal   Collection Time: 03/31/18  2:11 AM  Result Value Ref Range   Color, Urine YELLOW YELLOW   APPearance CLOUDY (A) CLEAR   Specific Gravity, Urine 1.010 1.005 - 1.030   pH 5.0 5.0 - 8.0   Glucose, UA NEGATIVE NEGATIVE mg/dL   Hgb urine dipstick MODERATE (A) NEGATIVE   Bilirubin Urine NEGATIVE NEGATIVE   Ketones, ur NEGATIVE NEGATIVE mg/dL   Protein, ur 30 (A) NEGATIVE mg/dL   Nitrite NEGATIVE NEGATIVE   Leukocytes, UA NEGATIVE NEGATIVE   RBC / HPF 6-30 0 - 5 RBC/hpf   WBC, UA 6-30 0 - 5 WBC/hpf   Bacteria, UA RARE (A) NONE SEEN   Squamous Epithelial / LPF 0-5 (A) NONE SEEN   Mucus PRESENT     Comment: Performed at [facility], [address] [place], [place] [address]  Basic metabolic panel     Status: Abnormal   Collection Time: 03/31/18  6:29 AM  Result Value Ref Range   Sodium 131 (L) 135 - 145 mmol/L   Potassium 3.7 3.5 - 5.1 mmol/L   Chloride 103 101 - 111 mmol/L   CO2 14 (L) 22 - 32 mmol/L   Glucose, Bld 110 (H) 65 - 99 mg/dL   BUN 69 (H) 6 - 20 mg/dL   Creatinine, Ser 5.23 (H) 0.61 - 1.24 mg/dL   Calcium 8.4 (L) 8.9 - 10.3 mg/dL   GFR calc non Af Amer 12 (L) >60 mL/min   GFR calc Af Amer 14 (L) >60 mL/min    Comment: (NOTE) The eGFR has been calculated using the CKD EPI equation. This calculation has not been validated in all clinical situations. eGFR's persistently <60 mL/min signify possible Chronic Kidney Disease.    Anion gap 14 5 - 15    Comment: Performed at [facility], [address] [place], [place] [address]  CBC     Status: Abnormal   Collection Time: 03/31/18  6:29 AM  Result Value Ref Range   WBC 11.8 (H) 4.0 - 10.5 K/uL   RBC 4.26 4.22 - 5.81 MIL/uL   Hemoglobin 13.3 13.0 - 17.0 g/dL   HCT 38.3 (L) 39.0 - 52.0 %   MCV 89.9 78.0 - 100.0 fL   MCH 31.2 26.0 -  34.0 pg   MCHC 34.7 30.0 - 36.0 g/dL   RDW 13.9 11.5 - 15.5 %   Platelets 121 (L) 150 - 400 K/uL    Comment: SPECIMEN CHECKED FOR CLOTS PLATELET COUNT CONFIRMED BY SMEAR Performed at [facility], [address] [place], [place] [address]   CBC     Status: Abnormal   Collection Time: 04/01/18  7:05 AM  Result Value Ref Range   WBC 9.2 4.0 - 10.5 K/uL   RBC 3.93 (L) 4.22 - 5.81 MIL/uL   Hemoglobin 12.2 (L) 13.0 - 17.0 g/dL   HCT 35.7 (L) 39.0 - 52.0 %   MCV 90.8 78.0 - 100.0 fL   MCH 31.0 26.0 - 34.0 pg   MCHC 34.2 30.0 - 36.0 g/dL   RDW 13.9 11.5 - 15.5 %   Platelets 127 (L) 150 - 400 K/uL    Comment: Performed at [facility], [address], [place], [place] [address]    Dg Chest 2 View  Result Date: 03/30/2018 CLINICAL DATA:  Shortness of  breath, generalized body aches, nausea, vomiting, diarrhea and RIGHT arm pain since Wednesday, syncopal episode this morning, smoker EXAM: CHEST - 2 VIEW COMPARISON:  None FINDINGS: Enlargement of cardiac silhouette. Mediastinal contours and pulmonary vascularity normal. LEFT upper lobe and LEFT lower lobe infiltrates consistent with pneumonia. RIGHT lung clear. No pleural effusion or pneumothorax. Osseous structures unremarkable. IMPRESSION: LEFT upper and LEFT lower lobe infiltrate consistent with pneumonia. Due to the focal dense nature of the LEFT infrahilar infiltrate, follow-up radiographs recommended in 2-3 weeks following treatment to ensure resolution and exclude underlying abnormalities including tumor. Electronically Signed   By: Lavonia Dana M.D.   On: 03/30/2018 13:24   US Renal  Result Date: 03/31/2018 CLINICAL DATA:  47 year old hypertensive male with acute kidney insufficiency. Initial encounter. EXAM: RENAL / URINARY TRACT ULTRASOUND COMPLETE COMPARISON:  None. FINDINGS: Right Kidney: Length: 11.5 cm. Minimal increased echogenicity. No mass or hydronephrosis visualized. Left Kidney: Length: 11.1 cm. Minimal increased echogenicity. No  mass or hydronephrosis visualized. Bladder: Appears normal for degree of bladder distention. IMPRESSION: Minimal increased echogenicity of the kidneys possibly representing changes of medical renal disease. No hydronephrosis. Electronically Signed   By: Genia Del M.D.   On: 03/31/2018 13:25    Review of Systems  Constitutional: Positive for fever. Negative for malaise/fatigue and weight loss.  Respiratory: Positive for cough. Negative for sputum production and shortness of breath.   Gastrointestinal: Positive for nausea. Negative for abdominal pain and vomiting.   Blood pressure (!) 153/84, pulse 69, temperature 98.4 F (36.9 C), temperature source Oral, resp. rate 16, height 6' (1.829 m), weight 120.1 kg (264 lb 12.4 oz), SpO2 99 %. Physical Exam  Constitutional: He is oriented to person, place, and time. No distress.  Eyes: No scleral icterus.  Neck: No JVD present.  Cardiovascular: Normal rate and regular rhythm.  No murmur heard. Respiratory: No respiratory distress. He has no wheezes. He has no rales.  GI: He exhibits no distension. There is no tenderness.  Musculoskeletal: He exhibits no edema.  Neurological: He is alert and oriented to person, place, and time.    Assessment/Plan: 1] renal failure: Acute on chronic versus chronic.  Presently his creatinine is improving hence he might have an acute component..  Etiology for his renal failure is not also clear.  We may be dealing with prerenal syndrome versus ATN.  Other primary and secondary cause of renal failure at this moment cannot be ruled out.  Ultrasound of the kidney showed right kidney to be 11.5 and left kidney 11.1.  Patient presently his creatinine seems to be coming down slightly and he is a symptomatic. 2] proteinuria: Seems to be non-nephrotic range 3] low CO2: Possibly metabolic acidosis 4] hypertension: Patient was not taking any antihypertensive medication before.  Presently his blood pressure is reasonably  controlled 5] pneumonia: Patient with left upper and left lower lobe infiltrate.  He is on antibiotics.  Is a febrile.  His white blood cell count is normal. 6] anemia Plan: 1] agree with hydration 2] we will check ANA, complement, hepatitis B surface antigen, hepatitis C antibody 3] since patient want to go home we will check urine protein to creatinine ratio 4] we will do 24-hour urine for protein and creatinine clearance it is going to stay 5] we will check his renal panel in the morning.  Grace Haggart S 04/01/2018, 7:45 AM

## 2018-04-01 NOTE — Progress Notes (Addendum)
PROGRESS NOTE    Zachary Mccann  WUJ:[id number] DOB: 02-19-1971 DOA: 03/30/2018 PCP: Patient, No Pcp Per    Brief Narrative:  47 year old male who does not have any prior records in our system, presents to the hospital with cough and fever.  Found to have continued acquired pneumonia and elevated creatinine.  Unclear if he has chronic kidney disease.  Admitted to the hospital for treatment of pneumonia and renal failure.  Overall pneumonia is better with antibiotics.  Nephrology consulted to assist with management of renal failure.  Assessment & Plan:   Principal Problem:   Pneumonia Active Problems:   Metabolic acidosis   AKI (acute kidney injury) (HCC)   N&V (nausea and vomiting)   Essential hypertension   1. Community-acquired pneumonia.  On ceftriaxone and azithromycin.  Blood cultures are showing no growth to date.  Overall respiratory status appears to be improving.  He is on room air without any shortness of breath.  Transition to oral medication today.  Doxycycline 100 mg BID.  2. Acute kidney injury.  Unclear if he has an element of chronic kidney disease.  Renal ultrasound suggesting medical renal disease.  He reports that he is not had any blood work done in several years.  Nephrology ordered work up including a 24 hour urine.  He has had  improvement of creatinine since admission with fluids.  Recheck labs in a.m. 3. Hypertension.  Continue on metoprolol. Titrate doses and add amlodipine.  Follow.  4. Metabolic acidosis.  Likely related to renal failure.  Nephrology following.   Continue with hydration for now. 5. Hyponatremia - likely from dehydration, treating with IVFs.    DVT prophylaxis: Lovenox Code Status: Full code Family Communication: No family present Disposition Plan: Discharge home once renal function is better.   Consultants:   nephrology  Procedures:     Antimicrobials:   Ceftriaxone 4/14 >  Azithromycin 4/14 >   Subjective: Feeling better  today.  He really wants to go home.  Objective: Vitals:   03/31/18 1133 03/31/18 2020 03/31/18 2137 04/01/18 0554  BP:  (!) 151/56 (!) 162/90 (!) 153/84  Pulse:  61 75 69  Resp:   18 16  Temp:   98.8 F (37.1 C) 98.4 F (36.9 C)  TempSrc:   Oral Oral  SpO2: 97%  94% 99%  Weight:      Height:        Intake/Output Summary (Last 24 hours) at 04/01/2018 0919 Last data filed at 04/01/2018 0500 Gross per 24 hour  Intake 2090 ml  Output -  Net 2090 ml   Filed Weights   03/30/18 1302 03/30/18 2026  Weight: 127 kg (280 lb) 120.1 kg (264 lb 12.4 oz)    Examination:  General exam: Appears calm and comfortable  Respiratory system: Clear to auscultation. Respiratory effort normal. Cardiovascular system: S1 & S2 heard, RRR. No JVD, murmurs, rubs, gallops or clicks. No pedal edema. Gastrointestinal system: Abdomen is nondistended, soft and nontender. No organomegaly or masses felt. Normal bowel sounds heard. Central nervous system: Alert and oriented. No focal neurological deficits. Extremities: Symmetric 5 x 5 power. Skin: No rashes, lesions or ulcers Psychiatry: Judgement and insight appear normal. Mood & affect appropriate.     Data Reviewed: I have personally reviewed following labs and imaging studies  CBC: Recent Labs  Lab 03/30/18 1346 03/31/18 0629 04/01/18 0705  WBC 16.1* 11.8* 9.2  NEUTROABS 12.6  --   --   HGB 15.6 13.3 12.2*  HCT  43.7 38.3* 35.7*  MCV 91.0 89.9 90.8  PLT 133* 121* 127*   Basic Metabolic Panel: Recent Labs  Lab 03/30/18 1346 03/30/18 2301 03/31/18 0629 04/01/18 0705  NA 130* 130* 131* 133*  K 3.7 3.7 3.7 3.5  CL 97* 102 103 105  CO2 15* 14* 14* 14*  GLUCOSE 118* 117* 110* 110*  BUN 66* 68* 69* 60*  CREATININE 5.87* 5.34* 5.23* 3.96*  CALCIUM 9.2 8.5* 8.4* 8.3*  PHOS  --   --   --  4.3   GFR: Estimated Creatinine Clearance: 30.9 mL/min (A) (by C-G formula based on SCr of 3.96 mg/dL (H)). Liver Function Tests: Recent Labs  Lab  03/30/18 1346 04/01/18 0705  AST 75*  --   ALT 51  --   ALKPHOS 81  --   BILITOT 0.6  --   PROT 9.7*  --   ALBUMIN 3.5 2.5*   No results for input(s): LIPASE, AMYLASE in the last 168 hours. No results for input(s): AMMONIA in the last 168 hours. Coagulation Profile: No results for input(s): INR, PROTIME in the last 168 hours. Cardiac Enzymes: No results for input(s): CKTOTAL, CKMB, CKMBINDEX, TROPONINI in the last 168 hours. BNP (last 3 results) No results for input(s): PROBNP in the last 8760 hours. HbA1C: No results for input(s): HGBA1C in the last 72 hours. CBG: No results for input(s): GLUCAP in the last 168 hours. Lipid Profile: No results for input(s): CHOL, HDL, LDLCALC, TRIG, CHOLHDL, LDLDIRECT in the last 72 hours. Thyroid Function Tests: No results for input(s): TSH, T4TOTAL, FREET4, T3FREE, THYROIDAB in the last 72 hours. Anemia Panel: No results for input(s): VITAMINB12, FOLATE, FERRITIN, TIBC, IRON, RETICCTPCT in the last 72 hours. Sepsis Labs: Recent Labs  Lab 03/30/18 1925 03/30/18 2301  LATICACIDVEN 1.6 0.9    Recent Results (from the past 240 hour(s))  Culture, blood (routine x 2) Call MD if unable to obtain prior to antibiotics being given     Status: None (Preliminary result)   Collection Time: 03/30/18 11:01 PM  Result Value Ref Range Status   Specimen Description BLOOD LEFT ARM  Final   Special Requests   Final    BOTTLES DRAWN AEROBIC AND ANAEROBIC Blood Culture adequate volume   Culture   Final    NO GROWTH 2 DAYS Performed at [facility], [address], [place], [place] [address]    Report Status PENDING  Incomplete  Culture, blood (routine x 2) Call MD if unable to obtain prior to antibiotics being given     Status: None (Preliminary result)   Collection Time: 03/30/18 11:03 PM  Result Value Ref Range Status   Specimen Description BLOOD RIGHT FOREARM  Final   Special Requests   Final    BOTTLES DRAWN AEROBIC AND ANAEROBIC Blood Culture  adequate volume   Culture   Final    NO GROWTH 2 DAYS Performed at [facility], [address], [place], [place] [address]    Report Status PENDING  Incomplete   Radiology Studies: Dg Chest 2 View  Result Date: 03/30/2018 CLINICAL DATA:  Shortness of breath, generalized body aches, nausea, vomiting, diarrhea and RIGHT arm pain since Wednesday, syncopal episode this morning, smoker EXAM: CHEST - 2 VIEW COMPARISON:  None FINDINGS: Enlargement of cardiac silhouette. Mediastinal contours and pulmonary vascularity normal. LEFT upper lobe and LEFT lower lobe infiltrates consistent with pneumonia. RIGHT lung clear. No pleural effusion or pneumothorax. Osseous structures unremarkable. IMPRESSION: LEFT upper and LEFT lower lobe infiltrate consistent with  pneumonia. Due to the focal dense nature of the LEFT infrahilar infiltrate, follow-up radiographs recommended in 2-3 weeks following treatment to ensure resolution and exclude underlying abnormalities including tumor. Electronically Signed   By: Ulyses Southward M.D.   On: 03/30/2018 13:24   US Renal  Result Date: 03/31/2018 CLINICAL DATA:  47 year old hypertensive male with acute kidney insufficiency. Initial encounter. EXAM: RENAL / URINARY TRACT ULTRASOUND COMPLETE COMPARISON:  None. FINDINGS: Right Kidney: Length: 11.5 cm. Minimal increased echogenicity. No mass or hydronephrosis visualized. Left Kidney: Length: 11.1 cm. Minimal increased echogenicity. No mass or hydronephrosis visualized. Bladder: Appears normal for degree of bladder distention. IMPRESSION: Minimal increased echogenicity of the kidneys possibly representing changes of medical renal disease. No hydronephrosis. Electronically Signed   By: Lacy Duverney M.D.   On: 03/31/2018 13:25   Scheduled Meds: . amLODipine  5 mg Oral Daily  . enoxaparin (LOVENOX) injection  40 mg Subcutaneous Q24H  . guaiFENesin  1,200 mg Oral BID  . metoprolol tartrate  50 mg Oral BID  . pneumococcal 23 valent vaccine   0.5 mL Intramuscular Tomorrow-1000   Continuous Infusions: . sodium chloride 100 mL/hr at 04/01/18 0800  . azithromycin Stopped (03/31/18 1643)  . cefTRIAXone (ROCEPHIN)  IV Stopped (03/31/18 1753)     LOS: 2 days    Time spent: 25 mins  Standley Dakins, MD [facility] Pager [phone]  If 7PM-7AM, please contact night-coverage [email or web address] Password [facility] 04/01/2018, 9:19 AM

## 2018-04-02 ENCOUNTER — Encounter (HOSPITAL_COMMUNITY): Payer: Self-pay

## 2018-04-02 DIAGNOSIS — N183 Chronic kidney disease, stage 3 unspecified: Secondary | ICD-10-CM | POA: Diagnosis present

## 2018-04-02 DIAGNOSIS — J189 Pneumonia, unspecified organism: Secondary | ICD-10-CM | POA: Diagnosis present

## 2018-04-02 DIAGNOSIS — E559 Vitamin D deficiency, unspecified: Secondary | ICD-10-CM | POA: Diagnosis present

## 2018-04-02 DIAGNOSIS — J181 Lobar pneumonia, unspecified organism: Secondary | ICD-10-CM

## 2018-04-02 DIAGNOSIS — I129 Hypertensive chronic kidney disease with stage 1 through stage 4 chronic kidney disease, or unspecified chronic kidney disease: Secondary | ICD-10-CM

## 2018-04-02 DIAGNOSIS — N184 Chronic kidney disease, stage 4 (severe): Secondary | ICD-10-CM

## 2018-04-02 LAB — CBC
HCT: 34.9 % — ABNORMAL LOW (ref 39.0–52.0)
Hemoglobin: 12.1 g/dL — ABNORMAL LOW (ref 13.0–17.0)
MCH: 31.6 pg (ref 26.0–34.0)
MCHC: 34.7 g/dL (ref 30.0–36.0)
MCV: 91.1 fL (ref 78.0–100.0)
PLATELETS: 176 10*3/uL (ref 150–400)
RBC: 3.83 MIL/uL — AB (ref 4.22–5.81)
RDW: 14.3 % (ref 11.5–15.5)
WBC: 11.1 10*3/uL — ABNORMAL HIGH (ref 4.0–10.5)

## 2018-04-02 LAB — RENAL FUNCTION PANEL
ALBUMIN: 2.5 g/dL — AB (ref 3.5–5.0)
ANION GAP: 12 (ref 5–15)
BUN: 45 mg/dL — AB (ref 6–20)
CALCIUM: 8.4 mg/dL — AB (ref 8.9–10.3)
CO2: 16 mmol/L — ABNORMAL LOW (ref 22–32)
CREATININE: 3.09 mg/dL — AB (ref 0.61–1.24)
Chloride: 107 mmol/L (ref 101–111)
GFR calc Af Amer: 26 mL/min — ABNORMAL LOW (ref >60)
GFR calc non Af Amer: 22 mL/min — ABNORMAL LOW (ref >60)
GLUCOSE: 105 mg/dL — AB (ref 65–99)
PHOSPHORUS: 3.3 mg/dL (ref 2.5–4.6)
Potassium: 3.3 mmol/L — ABNORMAL LOW (ref 3.5–5.1)
SODIUM: 135 mmol/L (ref 135–145)

## 2018-04-02 LAB — HEPATITIS B SURFACE ANTIGEN: Hepatitis B Surface Ag: NEGATIVE

## 2018-04-02 LAB — HEPATITIS C ANTIBODY

## 2018-04-02 LAB — LIPID PANEL
Cholesterol: 144 mg/dL (ref 0–200)
HDL: <10 mg/dL — ABNORMAL LOW (ref >40)
Triglycerides: 334 mg/dL — ABNORMAL HIGH (ref <150)
VLDL: 67 mg/dL — ABNORMAL HIGH (ref 0–40)

## 2018-04-02 LAB — C3 COMPLEMENT: C3 COMPLEMENT: 149 mg/dL (ref 82–167)

## 2018-04-02 LAB — COMPLEMENT, TOTAL: Compl, Total (CH50): >60 U/mL (ref >41)

## 2018-04-02 LAB — ANTINUCLEAR ANTIBODIES, IFA: ANTINUCLEAR ANTIBODIES, IFA: NEGATIVE

## 2018-04-02 LAB — C4 COMPLEMENT: Complement C4, Body Fluid: 37 mg/dL (ref 14–44)

## 2018-04-02 LAB — VITAMIN D 25 HYDROXY (VIT D DEFICIENCY, FRACTURES): Vit D, 25-Hydroxy: 5.5 ng/mL — ABNORMAL LOW (ref 30.0–100.0)

## 2018-04-02 LAB — ANTISTREPTOLYSIN O TITER: ASO: 26 [IU]/mL (ref 0.0–200.0)

## 2018-04-02 MED ORDER — HYDRALAZINE HCL 10 MG PO TABS: 10.0000 mg | ORAL_TABLET | Freq: Three times a day (TID) | ORAL | 0 refills | Status: DC

## 2018-04-02 MED ORDER — VITAMIN D (ERGOCALCIFEROL) 1.25 MG (50000 UNIT) PO CAPS: 50000.0000 [IU] | ORAL_CAPSULE | ORAL | 0 refills | Status: AC

## 2018-04-02 MED ORDER — VITAMIN D (ERGOCALCIFEROL) 1.25 MG (50000 UNIT) PO CAPS
50000.0000 [IU] | ORAL_CAPSULE | ORAL | Status: DC
  Administered 2018-04-02: 50000 [IU] via ORAL
  Filled 2018-04-02: qty 1

## 2018-04-02 MED ORDER — POTASSIUM CHLORIDE CRYS ER 20 MEQ PO TBCR
40.0000 meq | EXTENDED_RELEASE_TABLET | Freq: Once | ORAL | Status: AC
  Administered 2018-04-02: 40 meq via ORAL
  Filled 2018-04-02: qty 2

## 2018-04-02 MED ORDER — METOPROLOL TARTRATE 50 MG PO TABS: 50.0000 mg | ORAL_TABLET | Freq: Two times a day (BID) | ORAL | 0 refills | Status: DC

## 2018-04-02 MED ORDER — DOXYCYCLINE HYCLATE 100 MG PO TABS: 100.0000 mg | ORAL_TABLET | Freq: Two times a day (BID) | ORAL | 0 refills | Status: AC

## 2018-04-02 MED ORDER — AMLODIPINE BESYLATE 5 MG PO TABS: 5.0000 mg | ORAL_TABLET | Freq: Every day | ORAL | 0 refills | Status: DC

## 2018-04-02 NOTE — Discharge Instructions (Signed)
PLEASE AVOID ALL NSAIDS MEDICATIONS BECAUSE THEY WILL DAMAGE YOUR KIDNEYS!  PLEASE MONITOR YOUR BLOOD PRESSURE AND TAKE YOUR BLOOD PRESSURE MEDICATIONS.  PLEASE SEE A PRIMARY CARE PHYSICIAN AND PLEASE FOLLOW UP WITH THE NEPHROLOGIST AS SOON AS POSSIBLE.     Follow with Primary MD  Patient, No Pcp Per  and other consultants as instructed your Hospitalist MD  Please get a complete blood count and chemistry panel checked by your Primary MD at your next visit, and again as instructed by your Primary MD.  Get Medicines reviewed and adjusted: Please take all your medications with you for your next visit with your Primary MD  Laboratory/radiological data: Please request your Primary MD to go over all hospital tests and procedure/radiological results at the follow up, please ask your Primary MD to get all Hospital records sent to his/her office.  In some cases, they will be blood work, cultures and biopsy results pending at the time of your discharge. Please request that your primary care M.D. follows up on these results.  Also Note the following: If you experience worsening of your admission symptoms, develop shortness of breath, life threatening emergency, suicidal or homicidal thoughts you must seek medical attention immediately by calling 911 or calling your MD immediately  if symptoms less severe.  You must read complete instructions/literature along with all the possible adverse reactions/side effects for all the Medicines you take and that have been prescribed to you. Take any new Medicines after you have completely understood and accpet all the possible adverse reactions/side effects.   Do not drive when taking Pain medications or sleeping medications (Benzodaizepines)  Do not take more than prescribed Pain, Sleep and Anxiety Medications. It is not advisable to combine anxiety,sleep and pain medications without talking with your primary care practitioner  Special Instructions: If you have  smoked or chewed Tobacco  in the last 2 yrs please stop smoking, stop any regular Alcohol  and or any Recreational drug use.  Wear Seat belts while driving.  Please note: You were cared for by a hospitalist during your hospital stay. Once you are discharged, your primary care physician will handle any further medical issues. Please note that NO REFILLS for any discharge medications will be authorized once you are discharged, as it is imperative that you return to your primary care physician (or establish a relationship with a primary care physician if you do not have one) for your post hospital discharge needs so that they can reassess your need for medications and monitor your lab values.      Chronic Kidney Disease, Adult Chronic kidney disease (CKD) happens when the kidneys are damaged during a time of 3 or more months. The kidneys are two organs that do many important jobs in the body. These jobs include:  Removing wastes and extra fluids from the blood.  Making hormones that maintain the amount of fluid in your tissues and blood vessels.  Making sure that the body has the right amount of fluids and chemicals.  Most of the time, this condition does not go away, but it can usually be controlled. Steps must be taken to slow down the kidney damage or stop it from getting worse. Otherwise, the kidneys may stop working. Follow these instructions at home:  Follow your diet as told by your doctor. You may need to avoid alcohol, salty foods (sodium), and foods that are high in potassium, calcium, and protein.  Take over-the-counter and prescription medicines only as told by your doctor.  Do not take any new medicines unless your doctor says you can do that. These include vitamins and minerals. ? Medicines and nutritional supplements can make kidney damage worse. ? Your doctor may need to change how much medicine you take.  Do not use any tobacco products. These include cigarettes, chewing  tobacco, and e-cigarettes. If you need help quitting, ask your doctor.  Keep all follow-up visits as told by your doctor. This is important.  Check your blood pressure. Tell your doctor if there are changes to your blood pressure.  Get to a healthy weight. Stay at that weight. If you need help with this, ask your doctor.  Start or continue an exercise plan. Try to exercise at least 30 minutes a day, 5 days a week.  Stay up-to-date with your shots (immunizations) as told by your doctor. Contact a doctor if:  Your symptoms get worse.  You have new symptoms. Get help right away if:  You have symptoms of end-stage kidney disease. These include: ? Headaches. ? Skin that is darker or lighter than normal. ? Numbness in your hands or feet. ? Easy bruising. ? Having hiccups often. ? Chest pain. ? Shortness of breath. ? Stopping of menstrual periods in women.  You have a fever.  You are making very little pee (urine).  You have pain or bleeding when you pee (urinate). This information is not intended to replace advice given to you by your health care provider. Make sure you discuss any questions you have with your health care provider. Document Released: 02/27/2010 Document Revised: 05/10/2016 Document Reviewed: 08/01/2012 Elsevier Interactive Patient Education  2017 [employer]   Community-Acquired Pneumonia, Adult Pneumonia is an infection of the lungs. There are different types of pneumonia. One type can develop while a person is in a hospital. A different type, called community-acquired pneumonia, develops in people who are not, or have not recently been, in the hospital or other health care facility. What are the causes? Pneumonia may be caused by bacteria, viruses, or funguses. Community-acquired pneumonia is often caused by Streptococcus pneumonia bacteria. These bacteria are often passed from one person to another by breathing in droplets from the cough or sneeze of an  infected person. What increases the risk? The condition is more likely to develop in:  People who havechronic diseases, such as chronic obstructive pulmonary disease (COPD), asthma, congestive heart failure, cystic fibrosis, diabetes, or kidney disease.  People who haveearly-stage or late-stage HIV.  People who havesickle cell disease.  People who havehad their spleen removed (splenectomy).  People who havepoor Administrator.  People who havemedical conditions that increase the risk of breathing in (aspirating) secretions their own mouth and nose.  People who havea weakened immune system (immunocompromised).  People who smoke.  People whotravel to areas where pneumonia-causing germs commonly exist.  People whoare around animal habitats or animals that have pneumonia-causing germs, including birds, bats, rabbits, cats, and farm animals.  What are the signs or symptoms? Symptoms of this condition include:  Adry cough.  A wet (productive) cough.  Fever.  Sweating.  Chest pain, especially when breathing deeply or coughing.  Rapid breathing or difficulty breathing.  Shortness of breath.  Shaking chills.  Fatigue.  Muscle aches.  How is this diagnosed? Your health care provider will take a medical history and perform a physical exam. You may also have other tests, including:  Imaging studies of your chest, including X-rays.  Tests to check your blood oxygen level and other blood gases.  Other tests on blood, mucus (sputum), fluid around your lungs (pleural fluid), and urine.  If your pneumonia is severe, other tests may be done to identify the specific cause of your illness. How is this treated? The type of treatment that you receive depends on many factors, such as the cause of your pneumonia, the medicines you take, and other medical conditions that you have. For most adults, treatment and recovery from pneumonia may occur at home. In some cases,  treatment must happen in a hospital. Treatment may include:  Antibiotic medicines, if the pneumonia was caused by bacteria.  Antiviral medicines, if the pneumonia was caused by a virus.  Medicines that are given by mouth or through an IV tube.  Oxygen.  Respiratory therapy.  Although rare, treating severe pneumonia may include:  Mechanical ventilation. This is done if you are not breathing well on your own and you cannot maintain a safe blood oxygen level.  Thoracentesis. This procedureremoves fluid around one lung or both lungs to help you breathe better.  Follow these instructions at home:  Take over-the-counter and prescription medicines only as told by your health care provider. ? Only takecough medicine if you are losing sleep. Understand that cough medicine can prevent your bodys natural ability to remove mucus from your lungs. ? If you were prescribed an antibiotic medicine, take it as told by your health care provider. Do not stop taking the antibiotic even if you start to feel better.  Sleep in a semi-upright position at night. Try sleeping in a reclining chair, or place a few pillows under your head.  Do not use tobacco products, including cigarettes, chewing tobacco, and e-cigarettes. If you need help quitting, ask your health care provider.  Drink enough water to keep your urine clear or pale yellow. This will help to thin out mucus secretions in your lungs. How is this prevented? There are ways that you can decrease your risk of developing community-acquired pneumonia. Consider getting a pneumococcal vaccine if:  You are older than 47 years of age.  You are older than 47 years of age and are undergoing cancer treatment, have chronic lung disease, or have other medical conditions that affect your immune system. Ask your health care provider if this applies to you.  There are different types and schedules of pneumococcal vaccines. Ask your health care provider which  vaccination option is best for you. You may also prevent community-acquired pneumonia if you take these actions:  Get an influenza vaccine every year. Ask your health care provider which type of influenza vaccine is best for you.  Go to the dentist on a regular basis.  Wash your hands often. Use hand sanitizer if soap and water are not available.  Contact a health care provider if:  You have a fever.  You are losing sleep because you cannot control your cough with cough medicine. Get help right away if:  You have worsening shortness of breath.  You have increased chest pain.  Your sickness becomes worse, especially if you are an older adult or have a weakened immune system.  You cough up blood. This information is not intended to replace advice given to you by your health care provider. Make sure you discuss any questions you have with your health care provider. Document Released: 12/03/2005 Document Revised: 04/12/2016 Document Reviewed: 03/30/2015 Elsevier Interactive Patient Education  2018 [employer].  Food Basics for Chronic Kidney Disease When your kidneys are not working well, they cannot remove waste and  excess substances from your blood as effectively as they did before. This can lead to a buildup and imbalance of these substances, which can worsen kidney damage and affect how your body functions. Certain foods lead to a buildup of these substances in the body. By changing your diet as recommended by your diet and nutrition specialist (dietitian) or health care provider, you could help prevent further kidney damage and delay or prevent the need for dialysis. What are tips for following this plan? General instructions  Work with your health care provider and dietitian to develop a meal plan that is right for you. Foods you can eat, limit, or avoid will be different for each person depending on the stage of kidney disease and any other existing health conditions.  Talk  with your health care provider about whether you should take a vitamin and mineral supplement.  Use standard measuring cups and spoons to measure servings of foods. Use a kitchen scale to measure portions of protein foods.  If directed by your health care provider, avoid drinking too much fluid. Measure and count all liquids, including water, ice, soups, flavored gelatin, and frozen desserts such as popsicles or ice cream. Reading food labels  Check the amount of sodium in foods. Choose foods that have less than 300 milligrams (mg) per serving.  Check the ingredient list for phosphorus or potassium-based additives or preservatives.  Check the amount of saturated and trans fat. Limit or avoid these fats as told by your dietitian. Shopping  Avoid buying foods that are: ? Processed, frozen, or prepackaged. ? Calcium-enriched or fortified.  Do not buy foods that have salt or sodium listed among the first five ingredients.  Do not buy canned vegetables. Cooking  Replace animal proteins, such as meat, fish, eggs, or dairy, with plant proteins from beans, nuts, and soy. ? Use soy milk instead of cow's milk. ? Add beans or tofu to soups, casseroles, or pasta dishes instead of meat.  Soak vegetables, such as potatoes, before cooking to reduce potassium. To do this: ? Peel and cut into small pieces. ? Soak in warm water for at least 2 hours. For every 1 cup of vegetables, use 10 cups of water. ? Drain and rinse with warm water. ? Boil for at least 5 minutes. Meal planning  Limit the amount of protein from plant and animal sources you eat each day.  Do not add salt to food when cooking or before eating.  Eat meals and snacks at around the same time each day. If you have diabetes:  If you have diabetes (diabetes mellitus) and chronic kidney disease, it is important to keep your blood glucose in the target range recommended by your health care provider. Follow your diabetes management  plan. This may include: ? Checking your blood glucose regularly. ? Taking oral medicines, insulin, or both. ? Exercising for at least 30 minutes on 5 or more days each week, or as told by your health care provider. ? Tracking how many servings of carbohydrates you eat at each meal.  You may be given specific guidelines on how much of certain foods and nutrients you may eat, depending on your stage of kidney disease and whether you have high blood pressure (hypertension). Follow your meal plan as told by your dietitian. What nutrients should be limited? The items listed are not a complete list. Talk with your dietitian about what dietary choices are best for you. Potassium Potassium affects how steadily your heart beats. If too  much potassium builds up in your blood, it can cause an irregular heartbeat or even a heart attack. You may need to eat less potassium, depending on your blood potassium levels and the stage of kidney disease. Talk to your dietitian about how much potassium you may have each day. You may need to limit or avoid foods that are high in potassium, such as:  Milk and soy milk.  Fruits, such as bananas, papaya, apricots, nectarines, melon, prunes, raisins, kiwi, and oranges.  Vegetables, such as potatoes, sweet potatoes, yams, tomatoes, leafy greens, beets, okra, avocado, pumpkin, and winter squash.  White and lima beans.  Phosphorus Phosphorus is a mineral found in your bones. A balance between calcium and phosphorous is needed to build and maintain healthy bones. Too much phosphorus pulls calcium from your bones. This can make your bones weak and more likely to break. Too much phosphorus can also make your skin itch. You may need to eat less phosphorus depending on your blood phosphorus levels and the stage of kidney disease. Talk to your dietitian about how much potassium you may have each day. You may need to take medicine to lower your blood phosphorus levels if diet  changes do not help. You may need to limit or avoid foods that are high in phosphorus, such as:  Milk and dairy products.  Dried beans and peas.  Tofu, soy milk, and other soy-based meat replacements.  Colas.  Nuts and peanut butter.  Meat, poultry, and fish.  Bran cereals and oatmeals.  Protein Protein helps you to make and keep muscle. It also helps in the repair of your bodys cells and tissues. One of the natural breakdown products of protein is a waste product called urea. When your kidneys are not working properly, they cannot remove wastes, such as urea, like they did before you developed chronic kidney disease. Reducing how much protein you eat can help prevent a buildup of urea in your blood. Depending on your stage of kidney disease, you may need to limit foods that are high in protein. Sources of animal protein include:  Meat (all types).  Fish and seafood.  Poultry.  Eggs.  Dairy.  Other protein foods include:  Beans and legumes.  Nuts and nut butter.  Soy and tofu.  Sodium Sodium, which is found in salt, helps maintain a healthy balance of fluids in your body. Too much sodium can increase your blood pressure and have a negative effect on the function of your heart and lungs. Too much sodium can also cause your body to retain too much fluid, making your kidneys work harder. Most people should have less than 2,300 milligrams (mg) of sodium each day. If you have hypertension, you may need to limit your sodium to 1,500 mg each day. Talk to your dietitian about how much sodium you may have each day. You may need to limit or avoid foods that are high in sodium, such as:  Salt seasonings.  Soy sauce.  Cured and processed meats.  Salted crackers and snack foods.  Fast food.  Canned soups and most canned foods.  Pickled foods.  Vegetable juice.  Boxed mixes or ready-to-eat boxed meals and side dishes.  Bottled dressings, sauces, and  marinades.  Summary  Chronic kidney disease can lead to a buildup and imbalance of waste and excess substances in the body. Certain foods lead to a buildup of these substances. By adjusting your intake of these foods, you could help prevent more kidney damage and  delay or prevent the need for dialysis.  Food adjustments are different for each person with chronic kidney disease. Work with a dietitian to set up nutrient goals and a meal plan that is right for you.  If you have diabetes and chronic kidney disease, it is important to keep your blood glucose in the target range recommended by your health care provider.  This information is not intended to replace advice given to you by your health care provider. Make sure you discuss any questions you have with your health care provider.  Document Released: 02/23/2003 Document Revised: 11/28/2016 Document Reviewed: 11/28/2016 Elsevier Interactive Patient Education  [employer].

## 2018-04-02 NOTE — Discharge Summary (Signed)
Physician Discharge Summary  Zachary Mccann JWJ:[id number] DOB: 12-22-1970 DOA: 03/30/2018  PCP: Patient, No Pcp Per Nephrologist: Dr. Gerilyn Pilgrim  Admit date: 03/30/2018 Discharge date: 04/02/2018  Admitted From: Home  Disposition: Home  Recommendations for Outpatient Follow-up:  1. Follow up with PCP in 1 weeks 2. Follow up with nephrologist in 2 weeks 3. Please obtain BMP/CBC in one week 4. Please follow up on the following pending results: 24 hour urine studies   Discharge Condition: STABLE   CODE STATUS: FULL    Brief Hospitalization Summary: Please see all hospital notes, images, labs for full details of the hospitalization. From HPI: Zachary Mccann is a 47 y.o. male with a history of hypertension, although he has not been to see a physician and has not been treated for this.  Patient presents with 4 days of worsening cough, fever, with intermittent nausea vomiting and diarrhea.  He has been able to keep very little down.  Eating has been exacerbating his nausea and vomiting.  He has been able to tolerate some sips of fluids earlier today.  No other palliating or provoking factors.  Symptoms are worsening.  Emergency Department Course: Patient received IV antibiotics: Ceftriaxone and azithromycin for pneumonia evident on chest x-ray: Left upper and left lower lobe pneumonias.  White count was 16,000.  Creatinine 5.87 bicarb 15 anion gap 18.  Brief Narrative:  47 year old male who does not have any prior records in our system, presents to the hospital with cough and fever.  Found to have continued acquired pneumonia and elevated creatinine.  Unclear if he has chronic kidney disease.  Admitted to the hospital for treatment of pneumonia and renal failure.  Overall pneumonia is better with antibiotics.  Nephrology consulted to assist with management of renal failure.  Assessment & Plan:   Principal Problem:   Pneumonia Active Problems:   Metabolic acidosis   AKI (acute kidney  injury)   N&V (nausea and vomiting)   Essential hypertension  1. Community-acquired pneumonia.  Initially started on ceftriaxone and azithromycin.  Blood cultures are showing no growth to date.  Overall respiratory status appears to be improving.  He is on room air without any shortness of breath.  Transitioned to oral Doxycycline 100 mg BID.  2. Acute kidney injury on CKD stage 4.  Renal ultrasound suggesting medical renal disease.  He reports that he is not had any blood work done in several years.  Nephrology ordered work up including a 24 hour urine.  He has had  improvement of creatinine since admission with fluids.  He will follow up with nephrology outpatient to follow up on test results and to receive further evaluation.  3. Essential poorly controlled Hypertension.  Continue on metoprolol. Added amlodipine and hydralazine.  BPs are much better in the 140/90 range.  Follow up with PCP and nephrologist outpatient.   4. Metabolic acidosis.  resolved now.  Likely related to renal failure and dehydration.  Follow up with Nephrology outpatient and PCP.  He feels better and wants to go home.   5. Hyponatremia - likely from dehydration, treating with IVFs.  Resolved now.   DVT prophylaxis: Lovenox Code Status: Full code Family Communication: No family present Disposition Plan: Discharge home    Consultants:   nephrology   Antimicrobials:   Ceftriaxone 4/14 >  Azithromycin 4/14 >  Discharge Diagnoses:  Principal Problem:   Pneumonia Active Problems:   Metabolic acidosis   AKI (acute kidney injury) (HCC)   N&V (nausea and vomiting)  Essential hypertension   Severe Vitamin D deficiency   Community acquired pneumonia of left lower lobe of lung (HCC)   CKD stage 4 secondary to hypertension [facility])  Discharge Instructions: Discharge Instructions    Call MD for:  difficulty breathing, headache or visual disturbances   Complete by:  As directed    Call MD for:  extreme  fatigue   Complete by:  As directed    Call MD for:  persistant dizziness or light-headedness   Complete by:  As directed    Call MD for:  persistant nausea and vomiting   Complete by:  As directed    Diet - low sodium heart healthy   Complete by:  As directed    Increase activity slowly   Complete by:  As directed      Allergies as of 04/02/2018   No Known Allergies     Medication List    STOP taking these medications   ibuprofen 200 MG tablet Commonly known as:  ADVIL,MOTRIN     TAKE these medications   amLODipine 5 MG tablet Commonly known as:  NORVASC Take 1 tablet (5 mg total) by mouth daily. Start taking on:  04/03/2018   doxycycline 100 MG tablet Commonly known as:  VIBRA-TABS Take 1 tablet (100 mg total) by mouth every 12 (twelve) hours for 5 days.   hydrALAZINE 10 MG tablet Commonly known as:  APRESOLINE Take 1 tablet (10 mg total) by mouth every 8 (eight) hours.   metoprolol tartrate 50 MG tablet Commonly known as:  LOPRESSOR Take 1 tablet (50 mg total) by mouth 2 (two) times daily.   Vitamin D (Ergocalciferol) 50000 units Caps capsule Commonly known as:  DRISDOL Take 1 capsule (50,000 Units total) by mouth 2 (two) times a week. Start taking on:  04/03/2018      Follow-up Information    Salomon Mast, MD. Schedule an appointment as soon as possible for a visit in 2 week(s).   Specialty:  Nephrology Why:  Hospital Follow Up  Contact information: [address] [place] [address] [phone]        Aliene Beams, MD. Schedule an appointment as soon as possible for a visit in 1 week(s).   Specialty:  Family Medicine Why:  Hospital Follow Up: Establish Care New Patient Contact information: [address] [place] [address] [place] [address] [phone]          No Known Allergies Allergies as of 04/02/2018   No Known Allergies     Medication List    STOP taking these medications   ibuprofen 200 MG tablet Commonly known as:   ADVIL,MOTRIN     TAKE these medications   amLODipine 5 MG tablet Commonly known as:  NORVASC Take 1 tablet (5 mg total) by mouth daily. Start taking on:  04/03/2018   doxycycline 100 MG tablet Commonly known as:  VIBRA-TABS Take 1 tablet (100 mg total) by mouth every 12 (twelve) hours for 5 days.   hydrALAZINE 10 MG tablet Commonly known as:  APRESOLINE Take 1 tablet (10 mg total) by mouth every 8 (eight) hours.   metoprolol tartrate 50 MG tablet Commonly known as:  LOPRESSOR Take 1 tablet (50 mg total) by mouth 2 (two) times daily.   Vitamin D (Ergocalciferol) 50000 units Caps capsule Commonly known as:  DRISDOL Take 1 capsule (50,000 Units total) by mouth 2 (two) times a week. Start taking on:  04/03/2018       Procedures/Studies: Dg Chest  2 View  Result Date: 03/30/2018 CLINICAL DATA:  Shortness of breath, generalized body aches, nausea, vomiting, diarrhea and RIGHT arm pain since Wednesday, syncopal episode this morning, smoker EXAM: CHEST - 2 VIEW COMPARISON:  None FINDINGS: Enlargement of cardiac silhouette. Mediastinal contours and pulmonary vascularity normal. LEFT upper lobe and LEFT lower lobe infiltrates consistent with pneumonia. RIGHT lung clear. No pleural effusion or pneumothorax. Osseous structures unremarkable. IMPRESSION: LEFT upper and LEFT lower lobe infiltrate consistent with pneumonia. Due to the focal dense nature of the LEFT infrahilar infiltrate, follow-up radiographs recommended in 2-3 weeks following treatment to ensure resolution and exclude underlying abnormalities including tumor. Electronically Signed   By: Ulyses Southward M.D.   On: 03/30/2018 13:24   US Renal  Result Date: 03/31/2018 CLINICAL DATA:  47 year old hypertensive male with acute kidney insufficiency. Initial encounter. EXAM: RENAL / URINARY TRACT ULTRASOUND COMPLETE COMPARISON:  None. FINDINGS: Right Kidney: Length: 11.5 cm. Minimal increased echogenicity. No mass or hydronephrosis visualized.  Left Kidney: Length: 11.1 cm. Minimal increased echogenicity. No mass or hydronephrosis visualized. Bladder: Appears normal for degree of bladder distention. IMPRESSION: Minimal increased echogenicity of the kidneys possibly representing changes of medical renal disease. No hydronephrosis. Electronically Signed   By: Lacy Duverney M.D.   On: 03/31/2018 13:25      Subjective: Pt says he feels much better and wants to go home.    Discharge Exam: Vitals:   04/02/18 0549 04/02/18 0558  BP: (!) 146/82 (!) 146/82  Pulse: 66   Resp: 18   Temp: 98.4 F (36.9 C)   SpO2: 99%    Vitals:   04/01/18 2100 04/01/18 2124 04/02/18 0549 04/02/18 0558  BP: (!) 167/92 (!) 167/92 (!) 146/82 (!) 146/82  Pulse: 69 69 66   Resp: 18  18   Temp: 98.7 F (37.1 C)  98.4 F (36.9 C)   TempSrc: Oral  Oral   SpO2: 100%  99%   Weight:      Height:       General: Pt is alert, awake, not in acute distress Cardiovascular: RRR, S1/S2 +, no rubs, no gallops Respiratory: CTA bilaterally, no wheezing, no rhonchi Abdominal: Soft, NT, ND, bowel sounds + Extremities: no edema, no cyanosis   The results of significant diagnostics from this hospitalization (including imaging, microbiology, ancillary and laboratory) are listed below for reference.     Microbiology: Recent Results (from the past 240 hour(s))  Culture, blood (routine x 2) Call MD if unable to obtain prior to antibiotics being given     Status: None (Preliminary result)   Collection Time: 03/30/18 11:01 PM  Result Value Ref Range Status   Specimen Description BLOOD LEFT ARM  Final   Special Requests   Final    BOTTLES DRAWN AEROBIC AND ANAEROBIC Blood Culture adequate volume   Culture   Final    NO GROWTH 3 DAYS Performed at [facility], [address], [place], [place] [address]    Report Status PENDING  Incomplete  Culture, blood (routine x 2) Call MD if unable to obtain prior to antibiotics being given     Status: None (Preliminary result)    Collection Time: 03/30/18 11:03 PM  Result Value Ref Range Status   Specimen Description BLOOD RIGHT FOREARM  Final   Special Requests   Final    BOTTLES DRAWN AEROBIC AND ANAEROBIC Blood Culture adequate volume   Culture   Final    NO GROWTH 3 DAYS Performed at [facility], [address] [place], [place] [address]    Report Status PENDING  Incomplete     Labs: BNP (last 3 results) No results for input(s): BNP in the last 8760 hours. Basic Metabolic Panel: Recent Labs  Lab 03/30/18 1346 03/30/18 2301 03/31/18 0629 04/01/18 0705 04/02/18 0516  NA 130* 130* 131* 133* 135  K 3.7 3.7 3.7 3.5 3.3*  CL 97* 102 103 105 107  CO2 15* 14* 14* 14* 16*  GLUCOSE 118* 117* 110* 110* 105*  BUN 66* 68* 69* 60* 45*  CREATININE 5.87* 5.34* 5.23* 3.96* 3.09*  CALCIUM 9.2 8.5* 8.4* 8.3* 8.4*  PHOS  --   --   --  4.3 3.3   Liver Function Tests: Recent Labs  Lab 03/30/18 1346 04/01/18 0705 04/02/18 0516  AST 75*  --   --   ALT 51  --   --   ALKPHOS 81  --   --   BILITOT 0.6  --   --   PROT 9.7*  --   --   ALBUMIN 3.5 2.5* 2.5*   No results for input(s): LIPASE, AMYLASE in the last 168 hours. No results for input(s): AMMONIA in the last 168 hours. CBC: Recent Labs  Lab 03/30/18 1346 03/31/18 0629 04/01/18 0705 04/02/18 0516  WBC 16.1* 11.8* 9.2 11.1*  NEUTROABS 12.6  --   --   --   HGB 15.6 13.3 12.2* 12.1*  HCT 43.7 38.3* 35.7* 34.9*  MCV 91.0 89.9 90.8 91.1  PLT 133* 121* 127* 176   Cardiac Enzymes: No results for input(s): CKTOTAL, CKMB, CKMBINDEX, TROPONINI in the last 168 hours. BNP: Invalid input(s): POCBNP CBG: No results for input(s): GLUCAP in the last 168 hours. D-Dimer No results for input(s): DDIMER in the last 72 hours. Hgb A1c Recent Labs    04/01/18 0933  HGBA1C 5.5   Lipid Profile Recent Labs    04/02/18 0516  CHOL 144  HDL <10*  LDLCALC NOT CALCULATED  TRIG 334*  CHOLHDL NOT CALCULATED   Thyroid function studies Recent Labs     04/01/18 0933  TSH 2.024   Anemia work up No results for input(s): VITAMINB12, FOLATE, FERRITIN, TIBC, IRON, RETICCTPCT in the last 72 hours. Urinalysis    Component Value Date/Time   COLORURINE YELLOW 03/31/2018 0211   APPEARANCEUR CLOUDY (A) 03/31/2018 0211   LABSPEC 1.010 03/31/2018 0211   PHURINE 5.0 03/31/2018 0211   GLUCOSEU NEGATIVE 03/31/2018 0211   HGBUR MODERATE (A) 03/31/2018 0211   BILIRUBINUR NEGATIVE 03/31/2018 0211   KETONESUR NEGATIVE 03/31/2018 0211   PROTEINUR 30 (A) 03/31/2018 0211   NITRITE NEGATIVE 03/31/2018 0211   LEUKOCYTESUR NEGATIVE 03/31/2018 0211   Sepsis Labs Invalid input(s): PROCALCITONIN,  WBC,  LACTICIDVEN Microbiology Recent Results (from the past 240 hour(s))  Culture, blood (routine x 2) Call MD if unable to obtain prior to antibiotics being given     Status: None (Preliminary result)   Collection Time: 03/30/18 11:01 PM  Result Value Ref Range Status   Specimen Description BLOOD LEFT ARM  Final   Special Requests   Final    BOTTLES DRAWN AEROBIC AND ANAEROBIC Blood Culture adequate volume   Culture   Final    NO GROWTH 3 DAYS Performed at [facility], [address], [place], [place] [address]    Report Status PENDING  Incomplete  Culture, blood (routine x 2) Call MD if unable to obtain prior to antibiotics being given     Status: None (Preliminary result)   Collection  Time: 03/30/18 11:03 PM  Result Value Ref Range Status   Specimen Description BLOOD RIGHT FOREARM  Final   Special Requests   Final    BOTTLES DRAWN AEROBIC AND ANAEROBIC Blood Culture adequate volume   Culture   Final    NO GROWTH 3 DAYS Performed at [facility], [address] [place], [place] [address]    Report Status PENDING  Incomplete   Time coordinating discharge: 33 minutes  SIGNED:  Standley Dakins, MD  [facility] 04/02/2018, 11:07 AM Pager [phone]  If 7PM-7AM, please contact night-coverage [email or web address] Password [email or web address]

## 2018-04-02 NOTE — Care Management Note (Signed)
Case Management Note  Patient Details  Name: Zachary Mccann MRN: [id number] Date of Birth: 29-Mar-1971  Subjective/Objective:   Adm with Pneumonia/AKI. From home, works FT. Reports he has BCBS but we are unable to verify. Patient discharging today and plans to follow up on insurance information. Gave a list of PCP's for patient to establish care. He will be following up with Nephrology as well.                  Action/Plan: DC home with self care. Stable on room air.  Expected Discharge Date:  04/02/18               Expected Discharge Plan:  Home/Self Care  In-House Referral:     Discharge planning Services  CM Consult  Post Acute Care Choice:  NA Choice offered to:  NA  DME Arranged:    DME Agency:     HH Arranged:    HH Agency:     Status of Service:  Completed, signed off  If discussed at [employer] of Stay Meetings, dates discussed:    Additional Comments:  Solace Manwarren, Chrystine Oiler, RN 04/02/2018, 11:44 AM

## 2018-04-02 NOTE — Progress Notes (Signed)
Zachary Mccann  MRN: [id number]  DOB/AGE: Dec 29, 1970 47 y.o.  Primary Care Physician:Patient, No Pcp Per  Admit date: 03/30/2018  Chief Complaint:  Chief Complaint  Patient presents with  . Shortness of Breath    S-Pt presented on  03/30/2018 with  Chief Complaint  Patient presents with  . Shortness of Breath  .    Pt today feels better.    Meds . amLODipine  5 mg Oral Daily  . doxycycline  100 mg Oral Q12H  . enoxaparin (LOVENOX) injection  40 mg Subcutaneous Q24H  . guaiFENesin  1,200 mg Oral BID  . hydrALAZINE  10 mg Oral Q8H  . metoprolol tartrate  50 mg Oral BID  . pantoprazole  40 mg Oral Q0600  . pneumococcal 23 valent vaccine  0.5 mL Intramuscular Tomorrow-1000  . potassium chloride  40 mEq Oral Once  . Vitamin D (Ergocalciferol)  50,000 Units Oral Q7 days      Physical Exam: Vital signs in last 24 hours: Temp:  [98.3 F (36.8 C)-98.7 F (37.1 C)] 98.4 F (36.9 C) (04/17 0549) Pulse Rate:  [65-69] 66 (04/17 0549) Resp:  [16-18] 18 (04/17 0549) BP: (146-167)/(82-92) 146/82 (04/17 0558) SpO2:  [99 %-100 %] 99 % (04/17 0549) Weight change:  Last BM Date: 03/30/18  Intake/Output from previous day: 04/16 0701 - 04/17 0700 In: 4255 [P.O.:1680; I.V.:2575] Out: 1500 [Urine:1500] No intake/output data recorded.   Physical Exam: General- pt is awake,alert, oriented to time place and person Resp- No acute REsp distress, CTA B/L NO Rhonchi CVS- S1S2 regular ij rate and rhythm GIT- BS+, soft, NT, ND EXT- NO LE Edema, Cyanosis   Lab Results: CBC Recent Labs    04/01/18 0705 04/02/18 0516  WBC 9.2 11.1*  HGB 12.2* 12.1*  HCT 35.7* 34.9*  PLT 127* 176    BMET Recent Labs    04/01/18 0705 04/02/18 0516  NA 133* 135  K 3.5 3.3*  CL 105 107  CO2 14* 16*  GLUCOSE 110* 105*  BUN 60* 45*  CREATININE 3.96* 3.09*  CALCIUM 8.3* 8.4*   Creat trend 2019  5.8=> 3.96=>3.0  MICRO Recent Results (from the past 240 hour(s))  Culture, blood  (routine x 2) Call MD if unable to obtain prior to antibiotics being given     Status: None (Preliminary result)   Collection Time: 03/30/18 11:01 PM  Result Value Ref Range Status   Specimen Description BLOOD LEFT ARM  Final   Special Requests   Final    BOTTLES DRAWN AEROBIC AND ANAEROBIC Blood Culture adequate volume   Culture   Final    NO GROWTH 3 DAYS Performed at [facility], [address], [place], [place] [address]    Report Status PENDING  Incomplete  Culture, blood (routine x 2) Call MD if unable to obtain prior to antibiotics being given     Status: None (Preliminary result)   Collection Time: 03/30/18 11:03 PM  Result Value Ref Range Status   Specimen Description BLOOD RIGHT FOREARM  Final   Special Requests   Final    BOTTLES DRAWN AEROBIC AND ANAEROBIC Blood Culture adequate volume   Culture   Final    NO GROWTH 3 DAYS Performed at [facility], [address], [place], [place] [address]    Report Status PENDING  Incomplete      Lab Results  Component Value Date   CALCIUM 8.4 (L) 04/02/2018   PHOS 3.3 04/02/2018  Impression: 1)Renal  AKI secondary to Prerenal/ATN               AKI on CKD?               There is lack of data to know whne his kidney function strted deteriorating as pt has no previous labs.               CKD stage ? .               CKD since ?               CKD secondary to HTN?                Progression of CKD marked with AKI                Proteinura will check.                 Nephrolithiasis Hx Absent   2)HTN  Medication- On Calcium Channel Blockers On Beta blockers On Vasodilators-  3)Anemia HGb stable   4)CKD Mineral-Bone Disorder Phosphorus at goal. Calcium when corrected for low albumin is at goal.  5)ID-admitted with pneumonia PMD following  6)Electrolytes  hypokalemic  being replte   Hypoonatremic Now better   7)Acid base Co2 at goal     Plan:  Agree with KCL replacement. Will  continue current care. I educated pt about his kidney related issues and need to see nephrologist.      Randa Lynn 04/02/2018, 9:26 AM

## 2018-04-02 NOTE — Progress Notes (Signed)
Removed IV-clean, dry, intact. Reviewed d/c paperwork with pt. Reviewed new medications and where to pick up. Answered all questions. Stable pt walked to main entrance where he was picked up by his friend.

## 2018-04-02 NOTE — Plan of Care (Signed)
progressing 

## 2018-04-04 LAB — CULTURE, BLOOD (ROUTINE X 2)
Culture: NO GROWTH
Culture: NO GROWTH
Special Requests: ADEQUATE
Special Requests: ADEQUATE

## 2018-05-05 ENCOUNTER — Inpatient Hospital Stay (HOSPITAL_COMMUNITY)
Admission: EM | Admit: 2018-05-05 | Discharge: 2018-05-07 | DRG: 247 | Disposition: A | Discharge status: Home / Self Care | Payer: BLUE CROSS/BLUE SHIELD | Attending: Cardiology | Admitting: Cardiology

## 2018-05-05 ENCOUNTER — Emergency Department (HOSPITAL_COMMUNITY): Payer: BLUE CROSS/BLUE SHIELD

## 2018-05-05 ENCOUNTER — Inpatient Hospital Stay (HOSPITAL_COMMUNITY)
Admission: EM | Disposition: A | Discharge status: Home / Self Care | Payer: Self-pay | Source: Home / Self Care | Attending: Cardiology

## 2018-05-05 ENCOUNTER — Other Ambulatory Visit: Payer: Self-pay

## 2018-05-05 ENCOUNTER — Encounter (HOSPITAL_COMMUNITY): Payer: Self-pay

## 2018-05-05 DIAGNOSIS — I214 Non-ST elevation (NSTEMI) myocardial infarction: Secondary | ICD-10-CM | POA: Diagnosis not present

## 2018-05-05 DIAGNOSIS — I130 Hypertensive heart and chronic kidney disease with heart failure and stage 1 through stage 4 chronic kidney disease, or unspecified chronic kidney disease: Secondary | ICD-10-CM | POA: Diagnosis present

## 2018-05-05 DIAGNOSIS — I255 Ischemic cardiomyopathy: Secondary | ICD-10-CM | POA: Diagnosis not present

## 2018-05-05 DIAGNOSIS — Z955 Presence of coronary angioplasty implant and graft: Secondary | ICD-10-CM

## 2018-05-05 DIAGNOSIS — I10 Essential (primary) hypertension: Secondary | ICD-10-CM

## 2018-05-05 DIAGNOSIS — F17210 Nicotine dependence, cigarettes, uncomplicated: Secondary | ICD-10-CM | POA: Diagnosis not present

## 2018-05-05 DIAGNOSIS — Z886 Allergy status to analgesic agent status: Secondary | ICD-10-CM | POA: Diagnosis not present

## 2018-05-05 DIAGNOSIS — I5032 Chronic diastolic (congestive) heart failure: Secondary | ICD-10-CM | POA: Diagnosis present

## 2018-05-05 DIAGNOSIS — N183 Chronic kidney disease, stage 3 unspecified: Secondary | ICD-10-CM | POA: Diagnosis present

## 2018-05-05 DIAGNOSIS — I2510 Atherosclerotic heart disease of native coronary artery without angina pectoris: Secondary | ICD-10-CM | POA: Diagnosis present

## 2018-05-05 DIAGNOSIS — I252 Old myocardial infarction: Secondary | ICD-10-CM | POA: Diagnosis present

## 2018-05-05 DIAGNOSIS — R509 Fever, unspecified: Secondary | ICD-10-CM

## 2018-05-05 HISTORY — PX: CORONARY STENT INTERVENTION: CATH118234

## 2018-05-05 HISTORY — PX: LEFT HEART CATH AND CORONARY ANGIOGRAPHY: CATH118249

## 2018-05-05 HISTORY — DX: Pneumonia, unspecified organism: J18.9

## 2018-05-05 HISTORY — DX: Essential (primary) hypertension: I10

## 2018-05-05 LAB — CBC WITH DIFFERENTIAL/PLATELET
BASOS ABS: 0 10*3/uL (ref 0.0–0.1)
Basophils Relative: 0 %
Eosinophils Absolute: 0 10*3/uL (ref 0.0–0.7)
Eosinophils Relative: 0 %
HEMATOCRIT: 42.7 % (ref 39.0–52.0)
Hemoglobin: 14.7 g/dL (ref 13.0–17.0)
Lymphocytes Relative: 4 %
Lymphs Abs: 0.6 10*3/uL — ABNORMAL LOW (ref 0.7–4.0)
MCH: 32.5 pg (ref 26.0–34.0)
MCHC: 34.4 g/dL (ref 30.0–36.0)
MCV: 94.5 fL (ref 78.0–100.0)
MONO ABS: 1.1 10*3/uL — AB (ref 0.1–1.0)
Monocytes Relative: 7 %
Neutro Abs: 15.3 10*3/uL — ABNORMAL HIGH (ref 1.7–7.7)
Neutrophils Relative %: 90 %
Platelets: 227 10*3/uL (ref 150–400)
RBC: 4.52 MIL/uL (ref 4.22–5.81)
RDW: 13.4 % (ref 11.5–15.5)
WBC: 17 10*3/uL — ABNORMAL HIGH (ref 4.0–10.5)

## 2018-05-05 LAB — MRSA PCR SCREENING: MRSA BY PCR: NEGATIVE

## 2018-05-05 LAB — LACTIC ACID, PLASMA: LACTIC ACID, VENOUS: 1.6 mmol/L (ref 0.5–1.9)

## 2018-05-05 LAB — POCT ACTIVATED CLOTTING TIME: ACTIVATED CLOTTING TIME: 301 s

## 2018-05-05 LAB — COMPREHENSIVE METABOLIC PANEL
ALK PHOS: 84 U/L (ref 38–126)
ALT: 52 U/L (ref 17–63)
AST: 287 U/L — AB (ref 15–41)
Albumin: 3.8 g/dL (ref 3.5–5.0)
Anion gap: 11 (ref 5–15)
BUN: 20 mg/dL (ref 6–20)
CALCIUM: 9.2 mg/dL (ref 8.9–10.3)
CO2: 24 mmol/L (ref 22–32)
CREATININE: 1.77 mg/dL — AB (ref 0.61–1.24)
Chloride: 99 mmol/L — ABNORMAL LOW (ref 101–111)
GFR calc Af Amer: 51 mL/min — ABNORMAL LOW (ref >60)
GFR, EST NON AFRICAN AMERICAN: 44 mL/min — AB (ref >60)
Glucose, Bld: 124 mg/dL — ABNORMAL HIGH (ref 65–99)
Potassium: 3.7 mmol/L (ref 3.5–5.1)
Sodium: 134 mmol/L — ABNORMAL LOW (ref 135–145)
TOTAL PROTEIN: 8.6 g/dL — AB (ref 6.5–8.1)
Total Bilirubin: 1.2 mg/dL (ref 0.3–1.2)

## 2018-05-05 LAB — INFLUENZA PANEL BY PCR (TYPE A & B)
Influenza A By PCR: NEGATIVE
Influenza B By PCR: NEGATIVE

## 2018-05-05 LAB — D-DIMER, QUANTITATIVE (NOT AT ARMC): D DIMER QUANT: 0.36 ug{FEU}/mL (ref 0.00–0.50)

## 2018-05-05 LAB — I-STAT TROPONIN, ED: Troponin i, poc: >30 ng/mL (ref 0.00–0.08)

## 2018-05-05 LAB — TROPONIN I: Troponin I: 80.76 ng/mL (ref <0.03)

## 2018-05-05 SURGERY — LEFT HEART CATH AND CORONARY ANGIOGRAPHY
Anesthesia: LOCAL

## 2018-05-05 MED ORDER — NITROGLYCERIN 1 MG/10 ML FOR IR/CATH LAB
INTRA_ARTERIAL | Status: AC
  Filled 2018-05-05: qty 10

## 2018-05-05 MED ORDER — SODIUM CHLORIDE 0.9 % IV BOLUS
1000.0000 mL | Freq: Once | INTRAVENOUS | Status: AC
  Administered 2018-05-05: 1000 mL via INTRAVENOUS

## 2018-05-05 MED ORDER — HEPARIN BOLUS VIA INFUSION
4000.0000 [IU] | Freq: Once | INTRAVENOUS | Status: AC
  Administered 2018-05-05: 4000 [IU] via INTRAVENOUS

## 2018-05-05 MED ORDER — LABETALOL HCL 5 MG/ML IV SOLN
10.0000 mg | INTRAVENOUS | Status: AC | PRN
  Administered 2018-05-05: 10 mg via INTRAVENOUS
  Filled 2018-05-05: qty 4

## 2018-05-05 MED ORDER — FENTANYL CITRATE (PF) 100 MCG/2ML IJ SOLN
INTRAMUSCULAR | Status: DC | PRN
  Administered 2018-05-05 (×2): 25 ug via INTRAVENOUS

## 2018-05-05 MED ORDER — HYDRALAZINE HCL 20 MG/ML IJ SOLN
INTRAMUSCULAR | Status: DC | PRN
  Administered 2018-05-05: 10 mg via INTRAVENOUS

## 2018-05-05 MED ORDER — IOPAMIDOL (ISOVUE-370) INJECTION 76%
INTRAVENOUS | Status: AC
  Filled 2018-05-05: qty 100

## 2018-05-05 MED ORDER — ACETAMINOPHEN 325 MG PO TABS
650.0000 mg | ORAL_TABLET | ORAL | Status: DC | PRN
  Administered 2018-05-05: 650 mg via ORAL
  Filled 2018-05-05: qty 2

## 2018-05-05 MED ORDER — LABETALOL HCL 5 MG/ML IV SOLN
INTRAVENOUS | Status: DC | PRN
  Administered 2018-05-05: 10 mg via INTRAVENOUS

## 2018-05-05 MED ORDER — ACETAMINOPHEN 500 MG PO TABS
1000.0000 mg | ORAL_TABLET | Freq: Once | ORAL | Status: AC
Start: 2018-05-05 — End: 2018-05-05
  Administered 2018-05-05: 1000 mg via ORAL
  Filled 2018-05-05: qty 2

## 2018-05-05 MED ORDER — VERAPAMIL HCL 2.5 MG/ML IV SOLN
INTRAVENOUS | Status: AC
  Filled 2018-05-05: qty 2

## 2018-05-05 MED ORDER — NITROGLYCERIN IN D5W 200-5 MCG/ML-% IV SOLN
0.0000 ug/min | INTRAVENOUS | Status: DC
  Administered 2018-05-05: 5 ug/min via INTRAVENOUS
  Filled 2018-05-05: qty 250

## 2018-05-05 MED ORDER — HEPARIN (PORCINE) IN NACL 100-0.45 UNIT/ML-% IJ SOLN
1400.0000 [IU]/h | INTRAMUSCULAR | Status: DC
  Administered 2018-05-05: 1400 [IU]/h via INTRAVENOUS
  Filled 2018-05-05: qty 250

## 2018-05-05 MED ORDER — MIDAZOLAM HCL 2 MG/2ML IJ SOLN
INTRAMUSCULAR | Status: AC
  Filled 2018-05-05: qty 2

## 2018-05-05 MED ORDER — IOPAMIDOL (ISOVUE-370) INJECTION 76%
INTRAVENOUS | Status: DC | PRN
  Administered 2018-05-05: 160 mL via INTRA_ARTERIAL

## 2018-05-05 MED ORDER — SODIUM CHLORIDE 0.9 % IV SOLN
Freq: Once | INTRAVENOUS | Status: AC
  Administered 2018-05-05: 09:00:00 via INTRAVENOUS

## 2018-05-05 MED ORDER — LABETALOL HCL 5 MG/ML IV SOLN
INTRAVENOUS | Status: AC
  Filled 2018-05-05: qty 4

## 2018-05-05 MED ORDER — HEPARIN SODIUM (PORCINE) 1000 UNIT/ML IJ SOLN
INTRAMUSCULAR | Status: DC | PRN
  Administered 2018-05-05: 4000 [IU] via INTRAVENOUS
  Administered 2018-05-05: 6000 [IU] via INTRAVENOUS

## 2018-05-05 MED ORDER — SODIUM CHLORIDE 0.9% FLUSH: 3.0000 mL | INTRAVENOUS | Status: DC | PRN

## 2018-05-05 MED ORDER — NITROGLYCERIN IN D5W 200-5 MCG/ML-% IV SOLN: 0.0000 ug/min | INTRAVENOUS | Status: DC

## 2018-05-05 MED ORDER — LIDOCAINE HCL (PF) 1 % IJ SOLN
INTRAMUSCULAR | Status: AC
  Filled 2018-05-05: qty 30

## 2018-05-05 MED ORDER — NITROGLYCERIN 1 MG/10 ML FOR IR/CATH LAB
INTRA_ARTERIAL | Status: DC | PRN
  Administered 2018-05-05: 200 ug via INTRACORONARY

## 2018-05-05 MED ORDER — ONDANSETRON HCL 4 MG/2ML IJ SOLN
4.0000 mg | Freq: Once | INTRAMUSCULAR | Status: AC
  Administered 2018-05-05: 4 mg via INTRAVENOUS
  Filled 2018-05-05: qty 2

## 2018-05-05 MED ORDER — CLOPIDOGREL BISULFATE 75 MG PO TABS
300.0000 mg | ORAL_TABLET | Freq: Once | ORAL | Status: AC
  Administered 2018-05-05: 300 mg via ORAL
  Filled 2018-05-05: qty 4

## 2018-05-05 MED ORDER — SODIUM CHLORIDE 0.9 % IV SOLN
250.0000 mL | INTRAVENOUS | Status: DC | PRN
  Administered 2018-05-05: 250 mL via INTRAVENOUS

## 2018-05-05 MED ORDER — ATORVASTATIN CALCIUM 80 MG PO TABS
80.0000 mg | ORAL_TABLET | Freq: Every day | ORAL | Status: DC
  Administered 2018-05-05 – 2018-05-07 (×3): 80 mg via ORAL
  Filled 2018-05-05 (×3): qty 1

## 2018-05-05 MED ORDER — AMLODIPINE BESYLATE 5 MG PO TABS
5.0000 mg | ORAL_TABLET | Freq: Every day | ORAL | Status: DC
  Administered 2018-05-05: 5 mg via ORAL
  Filled 2018-05-05 (×2): qty 1

## 2018-05-05 MED ORDER — CARVEDILOL 6.25 MG PO TABS
6.2500 mg | ORAL_TABLET | Freq: Two times a day (BID) | ORAL | Status: DC
  Administered 2018-05-05 – 2018-05-07 (×5): 6.25 mg via ORAL
  Filled 2018-05-05 (×5): qty 1

## 2018-05-05 MED ORDER — HYDRALAZINE HCL 20 MG/ML IJ SOLN: 5.0000 mg | INTRAMUSCULAR | Status: AC | PRN

## 2018-05-05 MED ORDER — MIDAZOLAM HCL 2 MG/2ML IJ SOLN
INTRAMUSCULAR | Status: DC | PRN
  Administered 2018-05-05: 2 mg via INTRAVENOUS
  Administered 2018-05-05: 1 mg via INTRAVENOUS

## 2018-05-05 MED ORDER — TIROFIBAN HCL IN NACL 5-0.9 MG/100ML-% IV SOLN
INTRAVENOUS | Status: AC
  Filled 2018-05-05: qty 100

## 2018-05-05 MED ORDER — HEPARIN (PORCINE) IN NACL 1000-0.9 UT/500ML-% IV SOLN
INTRAVENOUS | Status: AC
  Filled 2018-05-05: qty 1000

## 2018-05-05 MED ORDER — TIROFIBAN (AGGRASTAT) BOLUS VIA INFUSION
INTRAVENOUS | Status: DC | PRN
  Administered 2018-05-05: 3175 ug via INTRAVENOUS

## 2018-05-05 MED ORDER — ONDANSETRON HCL 4 MG/2ML IJ SOLN: 4.0000 mg | Freq: Four times a day (QID) | INTRAMUSCULAR | Status: DC | PRN

## 2018-05-05 MED ORDER — FUROSEMIDE 10 MG/ML IJ SOLN
40.0000 mg | Freq: Once | INTRAMUSCULAR | Status: AC
  Administered 2018-05-05: 40 mg via INTRAMUSCULAR
  Filled 2018-05-05: qty 4

## 2018-05-05 MED ORDER — HYDRALAZINE HCL 20 MG/ML IJ SOLN
INTRAMUSCULAR | Status: AC
  Filled 2018-05-05: qty 1

## 2018-05-05 MED ORDER — SODIUM CHLORIDE 0.9% FLUSH
3.0000 mL | Freq: Two times a day (BID) | INTRAVENOUS | Status: DC
  Administered 2018-05-05 – 2018-05-07 (×4): 3 mL via INTRAVENOUS

## 2018-05-05 MED ORDER — MORPHINE SULFATE (PF) 4 MG/ML IV SOLN
4.0000 mg | INTRAVENOUS | Status: DC | PRN
  Filled 2018-05-05: qty 1

## 2018-05-05 MED ORDER — HEPARIN (PORCINE) IN NACL 2-0.9 UNITS/ML
INTRAMUSCULAR | Status: AC | PRN
  Administered 2018-05-05 (×2): 500 mL

## 2018-05-05 MED ORDER — HEPARIN SODIUM (PORCINE) 1000 UNIT/ML IJ SOLN
INTRAMUSCULAR | Status: AC
  Filled 2018-05-05: qty 1

## 2018-05-05 MED ORDER — LIDOCAINE HCL (PF) 1 % IJ SOLN
INTRAMUSCULAR | Status: DC | PRN
  Administered 2018-05-05: 2 mL via SUBCUTANEOUS

## 2018-05-05 MED ORDER — TICAGRELOR 90 MG PO TABS
90.0000 mg | ORAL_TABLET | Freq: Two times a day (BID) | ORAL | Status: DC
  Administered 2018-05-05 – 2018-05-07 (×4): 90 mg via ORAL
  Filled 2018-05-05 (×4): qty 1

## 2018-05-05 MED ORDER — FENTANYL CITRATE (PF) 100 MCG/2ML IJ SOLN
INTRAMUSCULAR | Status: AC
  Filled 2018-05-05: qty 2

## 2018-05-05 MED ORDER — TICAGRELOR 90 MG PO TABS
ORAL_TABLET | ORAL | Status: DC | PRN
  Administered 2018-05-05: 180 mg via ORAL

## 2018-05-05 MED ORDER — TICAGRELOR 90 MG PO TABS
ORAL_TABLET | ORAL | Status: AC
  Filled 2018-05-05: qty 2

## 2018-05-05 MED ORDER — HEPARIN (PORCINE) IN NACL 2-0.9 UNITS/ML
INTRAMUSCULAR | Status: DC | PRN
  Administered 2018-05-05: 10 mL via INTRA_ARTERIAL

## 2018-05-05 MED ORDER — SODIUM CHLORIDE 0.9 % IV SOLN
INTRAVENOUS | Status: AC
  Administered 2018-05-05: 15:00:00 via INTRAVENOUS

## 2018-05-05 SURGICAL SUPPLY — 20 items
BALLN SAPPHIRE 2.5X15 (BALLOONS) ×2
BALLN SAPPHIRE ~~LOC~~ 3.25X15 (BALLOONS) ×1 IMPLANT
BALLOON SAPPHIRE 2.5X15 (BALLOONS) IMPLANT
CATH INFINITI 5FR ANG PIGTAIL (CATHETERS) ×1 IMPLANT
CATH INFINITI JR4 5F (CATHETERS) ×1 IMPLANT
CATH OPTITORQUE TIG 4.0 5F (CATHETERS) ×2 IMPLANT
CATH VISTA GUIDE 6FR XB3.5 (CATHETERS) ×1 IMPLANT
DEVICE RAD COMP TR BAND LRG (VASCULAR PRODUCTS) ×1 IMPLANT
ELECT DEFIB PAD ADLT CADENCE (PAD) ×1 IMPLANT
GUIDEWIRE INQWIRE 1.5J.035X260 (WIRE) IMPLANT
INQWIRE 1.5J .035X260CM (WIRE) ×2
KIT ENCORE 26 ADVANTAGE (KITS) ×1 IMPLANT
KIT HEART LEFT (KITS) ×2 IMPLANT
NDL PERC 21GX4CM (NEEDLE) IMPLANT
NEEDLE PERC 21GX4CM (NEEDLE) ×2 IMPLANT
PACK CARDIAC CATHETERIZATION (CUSTOM PROCEDURE TRAY) ×2 IMPLANT
SHEATH RAIN RADIAL 21G 6FR (SHEATH) ×2 IMPLANT
STENT SYNERGY DES 3X24 (Permanent Stent) ×1 IMPLANT
TRANSDUCER W/STOPCOCK (MISCELLANEOUS) ×2 IMPLANT
TUBING CIL FLEX 10 FLL-RA (TUBING) ×2 IMPLANT

## 2018-05-05 NOTE — Progress Notes (Signed)
ANTICOAGULATION CONSULT NOTE - Initial Consult  Pharmacy Consult for heparin Indication: chest pain/ACS/STEMI  Allergies  Allergen Reactions  . Aspirin     Swelling     Patient Measurements: Height: 6' (182.9 cm) Weight: 280 lb (127 kg) IBW/kg (Calculated) : 77.6 Heparin Dosing Weight: 106 kg  Vital Signs: Temp: 100.9 F (38.3 C) (05/20 0800) Temp Source: Oral (05/20 0800) BP: 178/130 (05/20 0800) Pulse Rate: 121 (05/20 0800)  Labs: Recent Labs    05/05/18 0903  HGB 14.7  HCT 42.7  PLT 227  CREATININE 1.77*  TROPONINI 80.76*    Estimated Creatinine Clearance: 71.1 mL/min (A) (by C-G formula based on SCr of 1.77 mg/dL (H)).   Medical History: Past Medical History:  Diagnosis Date  . Hypertension   . Pneumonia     Medications:   See patient home meds  Assessment: Pharmacy consulted to dose heparin in patient with ACS/STEMI. Patient troponin level listed at 80.76.  Goal of Therapy:  Heparin level 0.3-0.7 units/ml Monitor platelets by anticoagulation protocol: Yes   Plan:  Give 4000 units bolus x 1 Start heparin infusion at 1400 units/hr Check anti-Xa level in 6 hours and daily while on heparin Continue to monitor H&H and platelets  Viviann Spare C Yandel Zeiner 05/05/2018,10:54 AM

## 2018-05-05 NOTE — ED Provider Notes (Signed)
[facility] CARDIAC CATH LAB Provider Note   CSN: [id number] Arrival date & time: 05/05/18  3295     History   Chief Complaint Chief Complaint  Patient presents with  . Cough  . Dizziness    HPI Zachary Mccann is a 47 y.o. male.  Chief complaint is chest pain, dizziness, chills.  HPI: 47 year old male.  History of hypertension and a recent pneumonia.  Today is Monday.  He reports that on Thursday evening at work he started feeling discomfort in his anterior and posterior right chest.  Is described as "like thousand needles were poking me at the same time".  This is persisted since that time.  Does not have any anterior, left-sided, or neck back or jaw pressure.  No diaphoresis.  He had chills today.  He felt dizzy and short of breath and presents here.  Stated that he was concerned that this might be other pneumonia.  He was treated with antibiotics for a upper lobe pneumonia approximately 5 weeks ago.  He recovered and had several weeks of feeling well before his symptoms started a few nights ago.  No personal, or family history of heart disease.  He does smoke.  He has history of hypertension.  Past Medical History:  Diagnosis Date  . Hypertension   . Pneumonia     Patient Active Problem List   Diagnosis Date Noted  . Non-ST elevation (NSTEMI) myocardial infarction (HCC) 05/05/2018  . Severe Vitamin D deficiency 04/02/2018  . Community acquired pneumonia of left lower lobe of lung (HCC) 04/02/2018  . CKD (chronic kidney disease), stage III (HCC) = Secondary to Hypertension 04/02/2018  . Pneumonia 03/30/2018  . Metabolic acidosis 03/30/2018  . AKI (acute kidney injury) (HCC) 03/30/2018  . N&V (nausea and vomiting) 03/30/2018  . Essential hypertension 03/30/2018    History reviewed. No pertinent surgical history.      Home Medications    Prior to Admission medications   Medication Sig Start Date End Date Taking? Authorizing Provider    amLODipine (NORVASC) 5 MG tablet Take 1 tablet (5 mg total) by mouth daily. 04/03/18 05/03/18  Johnson, Clanford L, MD  hydrALAZINE (APRESOLINE) 10 MG tablet Take 1 tablet (10 mg total) by mouth every 8 (eight) hours. 04/02/18 05/02/18  Johnson, Clanford L, MD  metoprolol tartrate (LOPRESSOR) 50 MG tablet Take 1 tablet (50 mg total) by mouth 2 (two) times daily. 04/02/18 05/02/18  Cleora Fleet, MD    Family History No family history on file.  Social History Social History   Tobacco Use  . Smoking status: Current Every Day Smoker    Packs/day: 0.50    Types: Cigarettes  . Smokeless tobacco: Never Used  Substance Use Topics  . Alcohol use: Yes    Comment: occ  . Drug use: Yes    Types: Marijuana    Comment: occ     Allergies   Aspirin   Review of Systems Review of Systems  Constitutional: Positive for chills and fatigue. Negative for appetite change, diaphoresis and fever.  HENT: Negative for mouth sores, sore throat and trouble swallowing.   Eyes: Negative for visual disturbance.  Respiratory: Positive for shortness of breath. Negative for cough, chest tightness and wheezing.   Cardiovascular: Positive for chest pain.  Gastrointestinal: Negative for abdominal distention, abdominal pain, diarrhea, nausea and vomiting.  Endocrine: Negative for polydipsia, polyphagia and polyuria.  Genitourinary: Negative for dysuria, frequency and hematuria.  Musculoskeletal: Negative for gait problem.  Skin: Negative  for color change, pallor and rash.  Neurological: Positive for dizziness. Negative for syncope, light-headedness and headaches.  Hematological: Does not bruise/bleed easily.  Psychiatric/Behavioral: Negative for behavioral problems and confusion.     Physical Exam Updated Vital Signs BP (!) 157/112   Pulse (!) 102   Temp (!) 100.9 F (38.3 C) (Oral)   Resp 14   Ht 6' (1.829 m)   Wt 127 kg (280 lb)   SpO2 100%   BMI 37.97 kg/m   Physical Exam  Constitutional:  He is oriented to person, place, and time. He appears well-developed and well-nourished. No distress.  Awake and alert.  No distress.  Conversant. Oral temp 101.4.  HENT:  Head: Normocephalic.  Eyes: Pupils are equal, round, and reactive to light. Conjunctivae are normal. No scleral icterus.  Neck: Normal range of motion. Neck supple. No thyromegaly present.  Cardiovascular: Normal rate and regular rhythm. Exam reveals no gallop and no friction rub.  No murmur heard. Sinus tachycardia.  Pulmonary/Chest: Effort normal and breath sounds normal. No respiratory distress. He has no wheezes. He has no rales.  Clear breath sounds.  No wheezing, rales, rhonchi, or prolongation.  He is not hypoxemic.  Abdominal: Soft. Bowel sounds are normal. He exhibits no distension. There is no tenderness. There is no rebound.  Musculoskeletal: Normal range of motion.  No swelling or cording of his extremities.  Neurological: He is alert and oriented to person, place, and time.  Skin: Skin is warm and dry. No rash noted.  Psychiatric: He has a normal mood and affect. His behavior is normal.     ED Treatments / Results  Labs (all labs ordered are listed, but only abnormal results are displayed) Labs Reviewed  CBC WITH DIFFERENTIAL/PLATELET - Abnormal; Notable for the following components:      Result Value   WBC 17.0 (*)    Neutro Abs 15.3 (*)    Lymphs Abs 0.6 (*)    Monocytes Absolute 1.1 (*)    All other components within normal limits  COMPREHENSIVE METABOLIC PANEL - Abnormal; Notable for the following components:   Sodium 134 (*)    Chloride 99 (*)    Glucose, Bld 124 (*)    Creatinine, Ser 1.77 (*)    Total Protein 8.6 (*)    AST 287 (*)    GFR calc non Af Amer 44 (*)    GFR calc Af Amer 51 (*)    All other components within normal limits  TROPONIN I - Abnormal; Notable for the following components:   Troponin I 80.76 (*)    All other components within normal limits  I-STAT TROPONIN, ED -  Abnormal; Notable for the following components:   Troponin i, poc >30.00 (*)    All other components within normal limits  LACTIC ACID, PLASMA  INFLUENZA PANEL BY PCR (TYPE A & B)  D-DIMER, QUANTITATIVE (NOT AT [facility])  HEPARIN LEVEL (UNFRACTIONATED)    EKG EKG Interpretation  Date/Time:  Monday May 05 2018 10:33:32 EDT Ventricular Rate:  111 PR Interval:    QRS Duration: 117 QT Interval:  308 QTC Calculation: 419 R Axis:   39 Text Interpretation:  Sinus tachycardia Biatrial enlargement Nonspecific intraventricular conduction delay Nonspecific T abnormalities, inferior leads Subtle ST changes Inferior leads Baseline wander in lead(s) II Confirmed by Rolland Porter ([id number]) on 05/05/2018 12:41:27 PM   Radiology Dg Chest 2 View  Result Date: 05/05/2018 CLINICAL DATA:  Cough and chest pain EXAM: CHEST - 2 VIEW COMPARISON:  03/30/2018 FINDINGS: The heart size and mediastinal contours are within normal limits. Both lungs are clear. The visualized skeletal structures are unremarkable. IMPRESSION: No active cardiopulmonary disease. Electronically Signed   By: Alcide Clever M.D.   On: 05/05/2018 08:47    Procedures Procedures (including critical care time)  Medications Ordered in ED Medications  nitroGLYCERIN 50 mg in dextrose 5 % 250 mL (0.2 mg/mL) infusion ( Intravenous MAR Hold 05/05/18 1153)  morphine 4 MG/ML injection 4 mg ( Intravenous MAR Hold 05/05/18 1153)  heparin ADULT infusion 100 units/mL (25000 units/218mL sodium chloride 0.45%) (1,400 Units/hr Intravenous New Bag/Given 05/05/18 1114)  heparin infusion 2 units/mL in 0.9 % sodium chloride (500 mLs Other New Bag/Given 05/05/18 1209)  fentaNYL (SUBLIMAZE) injection (25 mcg Intravenous Given 05/05/18 1217)  midazolam (VERSED) injection (2 mg Intravenous Given 05/05/18 1217)  lidocaine (PF) (XYLOCAINE) 1 % injection (2 mLs Subcutaneous Given 05/05/18 1222)  Radial Cocktail/Verapamil only (10 mLs Intra-arterial Given 05/05/18 1222)  heparin  injection (4,000 Units Intravenous Given 05/05/18 1238)  labetalol (NORMODYNE,TRANDATE) injection (10 mg Intravenous Given 05/05/18 1230)  labetalol (NORMODYNE,TRANDATE) injection (10 mg Intravenous Given 05/05/18 1248)  sodium chloride 0.9 % bolus 1,000 mL (0 mLs Intravenous Stopped 05/05/18 0945)  0.9 %  sodium chloride infusion ( Intravenous New Bag/Given 05/05/18 0917)  acetaminophen (TYLENOL) tablet 1,000 mg (1,000 mg Oral Given 05/05/18 0914)  ondansetron (ZOFRAN) injection 4 mg (4 mg Intravenous Given 05/05/18 1104)  heparin bolus via infusion 4,000 Units (4,000 Units Intravenous Bolus from Bag 05/05/18 1122)  clopidogrel (PLAVIX) tablet 300 mg (300 mg Oral Given 05/05/18 1113)     Initial Impression / Assessment and Plan / ED Course  I have reviewed the triage vital signs and the nursing notes.  Pertinent labs & imaging results that were available during my care of the patient were reviewed by me and considered in my medical decision making (see chart for details).  Clinical Course as of May 06 1255  Mon May 05, 2018  1039 DG Chest 2 View [MJ]    Clinical Course User Index [MJ] Rolland Porter, MD     EKG Interpretation  Date/Time:  Monday May 05 2018 10:33:32 EDT Ventricular Rate:  111 PR Interval:    QRS Duration: 117 QT Interval:  308 QTC Calculation: 419 R Axis:   39 Text Interpretation:  Sinus tachycardia Biatrial enlargement Nonspecific intraventricular conduction delay Nonspecific T abnormalities, inferior leads Subtle ST changes Inferior leads Baseline wander in lead(s) II Confirmed by Rolland Porter ([id number]) on 05/05/2018 12:41:27 PM  Quite atypical for cardiac disease or ACS.  Febrile.  Tachycardic.  Right-sided sharp pain.  No positional change to his pain.  He does not have murmurs or rubs that would suggest pericarditis or myocarditis.  Chest x-ray shows no infiltrates or effusions.  Consideration given for possible PE or radiographically occult pneumonia.  However EKG shows  inverted T waves.  No ST elevations or obvious changes noted.  Troponin returns quite elevated.  This is repeated and confirmed.  Started on heparin bolus and infusion.  Started on nitroglycerin infusion.  He is allergic to aspirin with mild swelling and was given 300 of p.o. Plavix.  I discussed the case with cardiology, Dr.Koneswaren, and Dr. Chilton Si at [facility].   Ranges made for immediate emergent transfer to [facility] for catheterization as patient has ongoing pain.  Repeat EKG shows subtle/minimal ST elevation in inferior leads one lead only.  No reciprocal changes.  Ongoing pain.  CRITICAL CARE Performed  by: Claudean Kinds   Total critical care time: 30 minutes  Critical care time was exclusive of separately billable procedures and treating other patients.  Critical care was necessary to treat or prevent imminent or life-threatening deterioration.  Critical care was time spent personally by me on the following activities: development of treatment plan with patient and/or surrogate as well as nursing, discussions with consultants, evaluation of patient's response to treatment, examination of patient, obtaining history from patient or surrogate, ordering and performing treatments and interventions, ordering and review of laboratory studies, ordering and review of radiographic studies, pulse oximetry and re-evaluation of patient's condition.    Final Clinical Impressions(s) / ED Diagnoses   Final diagnoses:  NSTEMI (non-ST elevated myocardial infarction) (HCC)  Fever, unspecified    ED Discharge Orders    None       Rolland Porter, MD 05/05/18 1256

## 2018-05-05 NOTE — ED Notes (Addendum)
Called [facility] about transfer of Pt to Cath Lab.

## 2018-05-05 NOTE — Progress Notes (Signed)
Addendum to H&P  ROS: Review of Systems  Constitutional: Positive for diaphoresis, fever and malaise/fatigue.  HENT: Negative.   Eyes: Negative.   Cardiovascular: Positive for chest pain. Negative for palpitations, orthopnea, leg swelling and PND.  Gastrointestinal: Positive for heartburn.  Genitourinary: Negative.   Musculoskeletal: Negative.   Skin: Negative.   Neurological: Positive for dizziness.  Endo/Heme/Allergies: Negative.   Psychiatric/Behavioral: Negative.    Family History:  Mother and father both deceased, no FHx of CAD, mother died of cancer, both parents had HTN  Social History:  Smoking 1/2 ppd for the past 10-20 years Occasional marijuana, occasional alcohol  Surgical History:  Per patient, other than a few stitches when he was young, otherwise no prior surgical history prior to today's cath and stent   Physical exam: General: A&O x3, no acute distress Cardiac: RRR, mildly tachycardic, no murmur Pulm: no wheezing, rhonchi or crackle, clear to auscultate bilaterally Psych: normal Abdomen: soft, nontender, nondistended Extremities: no swelling, normal bone formation HEENT: normal Neurology: normal    Ramond Dial PA Pager: [phone]

## 2018-05-05 NOTE — Progress Notes (Signed)
   05/05/18 1800  Note  Observations pt TR band is off. Swolleness has significantly gone down compared to when patient initailly arrived. Pt BP still a bit on the elevated side. HR in the 100's but stable. Pt is currently in bed, appears asleep. Nitro and NS infusing as ordered. Pt hand still elevated on pillows and radial site has a transparent occlusive dressing. no new changes or complaints at this time. Will continue to monitor.

## 2018-05-05 NOTE — ED Notes (Signed)
Dr. Fayrene Fearing notified of results. Repeat troponin ordered.

## 2018-05-05 NOTE — ED Notes (Signed)
Asked to call [facility] for [facility] to transfer Pt to [facility] Cath Lab emergency traffic.

## 2018-05-05 NOTE — Plan of Care (Signed)
Patient BP improving, titrating Nitroglycerin gtt as charted.  Given first dose of Brilinta as charted.  Radial site continues to be a grade 0, no signs of bleeding noted.  Monitoring.

## 2018-05-05 NOTE — H&P (Addendum)
Cardiology Admission History and Physical:   Patient ID: Zachary Mccann; MRN: [id number]; DOB: 05-07-1971   Admission date: 05/05/2018  Primary Care Provider: Patient, No Pcp Per Primary Cardiologist: No primary care provider on file.  Reportedly seen by Dr. Purvis Sheffield Primary Electrophysiologist: None  Chief Complaint:  Chest pain with shortness of breath, non-STEMI  Patient Profile:   Zachary Mccann is a 47 y.o. male with a history of difficult to control hypertension with hypertensive kidney disease, baseline creatinine roughly 1.5-2.0. -Transferred from [facility] emergency room for urgent cardiac catheterization with a diagnosis of non-STEMI.  History of Present Illness:   [name] recently was in the hospital for pneumonia (roughly 3 weeks ago).  He was recovering from this pneumonia when this past Thursday he started noticing feeling dizzy and short of breath with significant chest pain.  The symptoms became worse over the weekend and this morning he presented to any pain emergency room with significant chest pain.  He is noted to have an abnormal EKG and elevated troponin levels of roughly 80.76.  He is now brought urgently to [facility] cardiac catheterization lab for urgent cardiac catheterization with non-STEMI.  At the independent emergency room he was given 4000 units of IV heparin and placed on heparin drip.  He was also given 300 mg Plavix (no aspirin due to presumed allergy  Past Medical History:  Diagnosis Date  . Hypertension   . Pneumonia     History reviewed. No pertinent surgical history.  Per patient, other than a few stitches when he was young, otherwise no prior surgical history prior to today's cath and stent  Medications Prior to Admission: Prior to Admission medications   Medication Sig Start Date End Date Taking? Authorizing Provider  amLODipine (NORVASC) 5 MG tablet Take 1 tablet (5 mg total) by mouth daily. 04/03/18 05/03/18  Johnson, Clanford L, MD   hydrALAZINE (APRESOLINE) 10 MG tablet Take 1 tablet (10 mg total) by mouth every 8 (eight) hours. 04/02/18 05/02/18  Johnson, Clanford L, MD  metoprolol tartrate (LOPRESSOR) 50 MG tablet Take 1 tablet (50 mg total) by mouth 2 (two) times daily. 04/02/18 05/02/18  Cleora Fleet, MD     Allergies:    Allergies  Allergen Reactions  . Aspirin     Swelling     Social History:   Social History   Tobacco Use  . Smoking status: Current Every Day Smoker    Packs/day: 0.50    Types: Cigarettes  . Smokeless tobacco: Never Used  Substance Use Topics  . Alcohol use: Yes    Comment: occ  . Drug use: Yes    Types: Marijuana    Comment: occ   Social History   Social History Narrative  . Not on file  Smoking 1/2 ppd for the past 10-20 years  Occasional marijuana, occasional alcohol   Family History:    Mother and father both deceased, no FHx of CAD, mother died of cancer, both parents had HTN   ROS:  Constitutional: Positive for diaphoresis, fever and malaise/fatigue.  HENT: Negative.  Eyes: Negative.  Cardiovascular: Positive for chest pain. Negative for palpitations, orthopnea, leg swelling and PND.  Gastrointestinal: Positive for heartburn.  Genitourinary: Negative.  Musculoskeletal: Negative.  Skin: Negative.  Neurological: Positive for dizziness.  Endo/Heme/Allergies: Negative.  Psychiatric/Behavioral: Negative.   Please see the history of present illness.  All other ROS reviewed and negative.     Physical Exam/Data:   Vitals:   05/05/18 0801 05/05/18  1034 05/05/18 1045 05/05/18 1100  BP:  (!) 181/119  (!) 157/112  Pulse:  (!) 109 (!) 111 (!) 102  Resp:  (!) 34 (!) 23 14  Temp:      TempSrc:      SpO2:  94% 98% 98%  Weight: 280 lb (127 kg)     Height: 6' (1.829 m)       Intake/Output Summary (Last 24 hours) at 05/05/2018 1206 Last data filed at 05/05/2018 0945 Gross per 24 hour  Intake 999.75 ml  Output -  Net 999.75 ml   Filed Weights   05/05/18 0801   Weight: 280 lb (127 kg)   Body mass index is 37.97 kg/m.  Physical exam:  General: A&O x3, no acute distress  Cardiac: RRR, mildly tachycardic, no murmur  Pulm: no wheezing, rhonchi or crackle, clear to auscultate bilaterally  Psych: normal  Abdomen: soft, nontender, nondistended  Extremities: no swelling, normal bone formation  HEENT: normal  Neurology: normal     EKG:  The ECG that was done at any of him emergency room was personally reviewed and demonstrates sinus rhythm, rate 109 bpm.  Borderline and complete right bundle branch block noted.  T wave inversions in inferior and lateral leads with a subtle ST changes concerning for possible evolution of MI.  Relevant CV Studies: None Non-STEMI: Taken emergently to cardiac catheterization lab Laboratory Data:  Chemistry Recent Labs  Lab 05/05/18 0903  NA 134*  K 3.7  CL 99*  CO2 24  GLUCOSE 124*  BUN 20  CREATININE 1.77*  CALCIUM 9.2  GFRNONAA 44*  GFRAA 51*  ANIONGAP 11    Recent Labs  Lab 05/05/18 0903  PROT 8.6*  ALBUMIN 3.8  AST 287*  ALT 52  ALKPHOS 84  BILITOT 1.2   Hematology Recent Labs  Lab 05/05/18 0903  WBC 17.0*  RBC 4.52  HGB 14.7  HCT 42.7  MCV 94.5  MCH 32.5  MCHC 34.4  RDW 13.4  PLT 227   Cardiac Enzymes Recent Labs  Lab 05/05/18 0903  TROPONINI 80.76*    Recent Labs  Lab 05/05/18 0922  TROPIPOC >30.00*    BNPNo results for input(s): BNP, PROBNP in the last 168 hours.  DDimer  Recent Labs  Lab 05/05/18 1040  DDIMER 0.36    Radiology/Studies:  Dg Chest 2 View  Result Date: 05/05/2018 CLINICAL DATA:  Cough and chest pain EXAM: CHEST - 2 VIEW COMPARISON:  03/30/2018 FINDINGS: The heart size and mediastinal contours are within normal limits. Both lungs are clear. The visualized skeletal structures are unremarkable. IMPRESSION: No active cardiopulmonary disease. Electronically Signed   By: Alcide Clever M.D.   On: 05/05/2018 08:47    Assessment and Plan:     Principal Problem:   Non-ST elevation (NSTEMI) myocardial infarction [facility]) Active Problems:   Essential hypertension   CKD (chronic kidney disease), stage III (HCC) = Secondary to Hypertension  He was transferred urgently to [facility] for urgent cardiac catheterization and possible PCI.  He will need to aggressive risk factor medication with blood pressure control and lipid/glycemic control.  Further plans per cardiac cath note  Severity of Illness: The appropriate patient status for this patient is OBSERVATION. Observation status is judged to be reasonable and necessary in order to provide the required intensity of service to ensure the patient's safety. The patient's presenting symptoms, physical exam findings, and initial radiographic and laboratory data in the context of their medical condition is felt to place them  at decreased risk for further clinical deterioration. Furthermore, it is anticipated that the patient will be medically stable for discharge from the hospital within 2 midnights of admission. The following factors support the patient status of observation.   " The patient's presenting symptoms include significant chest pain and dyspnea. " The physical exam findings include tachycardia. " The initial radiographic and laboratory data are concerning EKG changes for ischemia as well as elevated troponin level.     For questions or updates, please contact [facility] Please consult [email or web address] for contact info under Cardiology/STEMI.    Signed, Bryan Lemma, MD  05/05/2018 12:06 PM

## 2018-05-05 NOTE — ED Notes (Signed)
Date and time results received: 05/05/18 10:32 AM  (use smartphrase ".now" to insert current time)  Test: Troponin Critical Value: 80.76  Name of Provider Notified: Fayrene Fearing  Orders Received? Or Actions Taken?: Orders Received - See Orders for details

## 2018-05-05 NOTE — ED Notes (Signed)
Pt returned from xray

## 2018-05-05 NOTE — ED Triage Notes (Signed)
Pt reports recently was admitted with pneumonia approx 3 weeks ago.  Reports since Friday has been feeling dizzy, coughing, chest pain, sob, and r arm pain.

## 2018-05-05 NOTE — ED Notes (Signed)
Patient transported to X-ray 

## 2018-05-06 ENCOUNTER — Inpatient Hospital Stay (HOSPITAL_COMMUNITY): Payer: BLUE CROSS/BLUE SHIELD

## 2018-05-06 DIAGNOSIS — I2510 Atherosclerotic heart disease of native coronary artery without angina pectoris: Secondary | ICD-10-CM

## 2018-05-06 LAB — CBC
HEMATOCRIT: 43.2 % (ref 39.0–52.0)
HEMOGLOBIN: 14.4 g/dL (ref 13.0–17.0)
MCH: 32 pg (ref 26.0–34.0)
MCHC: 33.3 g/dL (ref 30.0–36.0)
MCV: 96 fL (ref 78.0–100.0)
Platelets: 198 10*3/uL (ref 150–400)
RBC: 4.5 MIL/uL (ref 4.22–5.81)
RDW: 13.6 % (ref 11.5–15.5)
WBC: 17.6 10*3/uL — AB (ref 4.0–10.5)

## 2018-05-06 LAB — BASIC METABOLIC PANEL
ANION GAP: 10 (ref 5–15)
BUN: 21 mg/dL — ABNORMAL HIGH (ref 6–20)
CALCIUM: 8.4 mg/dL — AB (ref 8.9–10.3)
CO2: 23 mmol/L (ref 22–32)
Chloride: 100 mmol/L — ABNORMAL LOW (ref 101–111)
Creatinine, Ser: 2.17 mg/dL — ABNORMAL HIGH (ref 0.61–1.24)
GFR, EST AFRICAN AMERICAN: 40 mL/min — AB (ref >60)
GFR, EST NON AFRICAN AMERICAN: 34 mL/min — AB (ref >60)
GLUCOSE: 121 mg/dL — AB (ref 65–99)
Potassium: 3.9 mmol/L (ref 3.5–5.1)
SODIUM: 133 mmol/L — AB (ref 135–145)

## 2018-05-06 LAB — ECHOCARDIOGRAM COMPLETE
HEIGHTINCHES: 72 in
WEIGHTICAEL: 4480 [oz_av]

## 2018-05-06 MED ORDER — ISOSORB DINITRATE-HYDRALAZINE 20-37.5 MG PO TABS
1.0000 | ORAL_TABLET | Freq: Three times a day (TID) | ORAL | Status: DC
  Administered 2018-05-06 – 2018-05-07 (×5): 1 via ORAL
  Filled 2018-05-06 (×6): qty 1

## 2018-05-06 MED FILL — Heparin Sod (Porcine)-NaCl IV Soln 1000 Unit/500ML-0.9%: INTRAVENOUS | Qty: 1000 | Status: AC

## 2018-05-06 MED FILL — Tirofiban HCl in NaCl 0.9% IV Soln 5 MG/100ML (Base Equiv): INTRAVENOUS | Qty: 100 | Status: AC

## 2018-05-06 NOTE — Progress Notes (Signed)
Progress Note  Patient Name: Zachary Mccann Date of Encounter: 05/06/2018  Primary Cardiologist:Harding  Subjective   47 year old African-American male who lives in [place] and has no primary care doctor postop day 1 circumflex STEMI.  He currently is pain-free.  Inpatient Medications    Scheduled Meds: . atorvastatin  80 mg Oral q1800  . carvedilol  6.25 mg Oral BID WC  . isosorbide-hydrALAZINE  1 tablet Oral TID  . sodium chloride flush  3 mL Intravenous Q12H  . ticagrelor  90 mg Oral BID   Continuous Infusions: . sodium chloride 10 mL/hr at 05/06/18 0700   PRN Meds: sodium chloride, acetaminophen, morphine injection, ondansetron (ZOFRAN) IV, sodium chloride flush   Vital Signs    Vitals:   05/06/18 0500 05/06/18 0600 05/06/18 0700 05/06/18 0802  BP: 121/89 (!) 130/94 (!) 133/104   Pulse: 88 83 86   Resp: 20 20 17    Temp:    98.2 F (36.8 C)  TempSrc:    Oral  SpO2: (!) 88% 95% 93%   Weight:      Height:        Intake/Output Summary (Last 24 hours) at 05/06/2018 0918 Last data filed at 05/06/2018 0700 Gross per 24 hour  Intake 1665.04 ml  Output 2540 ml  Net -874.96 ml   Filed Weights   05/05/18 0801  Weight: 280 lb (127 kg)    Telemetry    Sinus rhythm- Personally Reviewed  ECG    Normal sinus rhythm at 78 with inferolateral T wave inversion.- Personally Reviewed  Physical Exam   GEN: No acute distress.   Neck: No JVD Cardiac: RRR, no murmurs, rubs, or gallops.  Respiratory: Clear to auscultation bilaterally. GI: Soft, nontender, non-distended  MS: No edema; No deformity. Neuro:  Nonfocal  Psych: Normal affect  Extremity: Right radial puncture site intact  Labs    Chemistry Recent Labs  Lab 05/05/18 0903 05/06/18 0323  NA 134* 133*  K 3.7 3.9  CL 99* 100*  CO2 24 23  GLUCOSE 124* 121*  BUN 20 21*  CREATININE 1.77* 2.17*  CALCIUM 9.2 8.4*  PROT 8.6*  --   ALBUMIN 3.8  --   AST 287*  --   ALT 52  --   ALKPHOS 84  --     BILITOT 1.2  --   GFRNONAA 44* 34*  GFRAA 51* 40*  ANIONGAP 11 10     Hematology Recent Labs  Lab 05/05/18 0903 05/06/18 0323  WBC 17.0* 17.6*  RBC 4.52 4.50  HGB 14.7 14.4  HCT 42.7 43.2  MCV 94.5 96.0  MCH 32.5 32.0  MCHC 34.4 33.3  RDW 13.4 13.6  PLT 227 198    Cardiac Enzymes Recent Labs  Lab 05/05/18 0903  TROPONINI 80.76*    Recent Labs  Lab 05/05/18 0922  TROPIPOC >30.00*     BNPNo results for input(s): BNP, PROBNP in the last 168 hours.   DDimer  Recent Labs  Lab 05/05/18 1040  DDIMER 0.36     Radiology    Dg Chest 2 View  Result Date: 05/05/2018 CLINICAL DATA:  Cough and chest pain EXAM: CHEST - 2 VIEW COMPARISON:  03/30/2018 FINDINGS: The heart size and mediastinal contours are within normal limits. Both lungs are clear. The visualized skeletal structures are unremarkable. IMPRESSION: No active cardiopulmonary disease. Electronically Signed   By: Alcide Clever M.D.   On: 05/05/2018 08:47    Cardiac Studies   Cardiac catheterization (05/05/2018)  Conclusion  Mid Cx to Dist Cx lesion is 100% stenosed.  A drug-eluting stent was successfully placed using a STENT SYNERGY DES 3X24. -Postdilated to 3.3 mm  Post intervention, there is a 0% residual stenosis.  LAV Groove lesion is 65% stenosed - In a small branch beyond the stent.  There is moderate left ventricular systolic dysfunction. The left ventricular ejection fraction is 35-45% by visual estimate.   Severe single-vessel disease with occlusion of the distal Cx  treated with single DES stent. Moderately reduced LVEF with moderately elevated LVEDP.     Plan: Admit to CCU.  Continue IV nitroglycerin overnight.  With aspirin allergy, have changed from Plavix to Brilinta for antiplatelet agent.  Elevated LVEDP, but CKD-3, will hydrate and give 1 dose of IV Lasix. -Consider diuretic for part of antihypertensive therapy (consider chlorthalidone).  Aggressive blood pressure control,  have converted from metoprolol to carvedilol along with amlodipine.-Hold off on ARB/ACE inhibitor until renal function stable post cath  High-dose statin   Check 2D echocardiogram to better assess EF      Patient Profile     48 y.o. male without prior cardiac history on no medications postop day 1 inferolateral STEMI treated with PCI and drug-eluting stenting of the mid to distal AV groove circumflex.  There is no residual CAD in his remaining vessels.  His LV appeared with reduced on LV gram and there was elevated LV EDP.  He currently is pain-free.  Assessment & Plan    1: Coronary artery disease- postop day 1 inferior STEMI.  The patient has a true aspirin allergy and is on Brilinta.  His troponin was greater than 80.  He had no continued chest pain and his EKG shows inferolateral T wave inversion.  Continue guideline directed optimal post MI pharmacology including antiplatelet agents and high-dose statin therapy.  2: Hypertension- his blood pressure diastolic is 100.  He was on amlodipine apparently as an outpatient although he is not taking his medications as directed.  He has chronic renal insufficiency with serum creatinine now of 2.17 although it is been higher recently.  He is not a candidate be on an ACE and/or ARB .  We will begin him on BiDil and carvedilol.  3: Ischemic cardiomyopathy- EF was moderately depressed at the time of cath.  2D echo is pending.  He is on carvedilol.  Normal progression, transfer to telemetry, cardiac rehab.  Anticipate discharge 48 hours   For questions or updates, please contact [facility] Please consult [email or web address] for contact info under Cardiology/STEMI.      Signed, Nanetta Batty, MD  05/06/2018, 9:18 AM

## 2018-05-06 NOTE — Progress Notes (Signed)
Per insurance check on Brilinta and Bidil  # 10.  S/W [facility] @ [employer] # [phone]    1. BRILINTA  90 MG BID  COVER- YES  CO-PAY- $ 80.00  TIER- NO  PRIOR APPROVAL- NO     2. BIDIL 20-37.5  TID  COVER- YES  CO-PAY- $ 23.20  TIER- NO  PRIOR APPROVAL- NO   NO DEDUCTIBLE   PREFERRED PHARMACY : [facility]

## 2018-05-06 NOTE — Progress Notes (Signed)
CARDIAC REHAB PHASE I   Pt walked from 2H to 6E with RN, no problems, sts he felt much better than PTA. Sts he has been SOB with exertion and having reflux sx since January. Now he is not feeling those sx. Began ed including Brilinta, MI, stent, restrictions, and smoking cessation. Pt is contemplating quitting smoking. He took the fake cigarette but admitted that it made him want a real one. Encouraged him to think through a cessation plan. Also left diet sheets. Encouraged more walking.  Will f/u tomorrow. [phone] Harriet Masson CES, ACSM 05/06/2018 2:39 PM

## 2018-05-06 NOTE — Care Management (Addendum)
1017 05-07-18 Tomi Bamberger, RN, BSN [phone] CM did speak with pt in regards to Brilinta/ Bidil [employer] pay. Pt has discount card for Brilinta. Pt wanted to utilize [employer]- (medication not in stock). CM did call [facility] and medication in stock. Please send pt with paper Rx for Brilinta. CM did make pt aware to call the [place] number on the back of the savings card. No further needs from CM at this time.     1523 05-06-18 CM received referral to check the cost of Bidil: Benefits Check completed:    S/[facility]  @ [employer] # [phone]    ISOSORBIDE - HYDRALAZINE ( BIDIL ) 20-37.5 MG TAB TID  COVER- YES  CO-PAY- $ 23.20  TIER- NO  PRIOR APPROVAL- NO  NO DEDUCTIBLE   PREFERRED PHARMACY : YES- [facility]

## 2018-05-06 NOTE — Progress Notes (Signed)
*  PRELIMINARY RESULTS* Echocardiogram 2D Echocardiogram has been performed.  Jeryl Columbia 05/06/2018, 3:11 PM

## 2018-05-06 NOTE — Progress Notes (Signed)
CARDIAC REHAB PHASE I   PRE:  Rate/Rhythm: 90 SR with PVCs    BP: sitting 104/66    SaO2: 91-94 1L  MODE:  Ambulation: 470 ft   POST:  Rate/Rhythm: 110 ST    BP: sitting 132/58     SaO2: 96-97 4L  Pt able to stand and walk with RW, 4L. Tolerated well, steady. Rest x1 for SOB but sAO2 96 4L. Returned to 1L in room. Pt practiced IS but only 500 and it makes him cough. Encouraged more use and heart pillow. Encouraged another walk this pm. [phone]   Harriet Masson CES, ACSM 05/06/2018 3:28 PM

## 2018-05-07 LAB — LIPID PANEL
CHOL/HDL RATIO: 4.3 ratio
Cholesterol: 115 mg/dL (ref 0–200)
HDL: 27 mg/dL — AB (ref >40)
LDL Cholesterol: 56 mg/dL (ref 0–99)
Triglycerides: 158 mg/dL — ABNORMAL HIGH (ref <150)
VLDL: 32 mg/dL (ref 0–40)

## 2018-05-07 LAB — BASIC METABOLIC PANEL
Anion gap: 11 (ref 5–15)
BUN: 29 mg/dL — AB (ref 6–20)
CALCIUM: 8.4 mg/dL — AB (ref 8.9–10.3)
CO2: 22 mmol/L (ref 22–32)
CREATININE: 1.94 mg/dL — AB (ref 0.61–1.24)
Chloride: 101 mmol/L (ref 101–111)
GFR calc non Af Amer: 39 mL/min — ABNORMAL LOW (ref >60)
GFR, EST AFRICAN AMERICAN: 46 mL/min — AB (ref >60)
Glucose, Bld: 124 mg/dL — ABNORMAL HIGH (ref 65–99)
Potassium: 3.4 mmol/L — ABNORMAL LOW (ref 3.5–5.1)
SODIUM: 134 mmol/L — AB (ref 135–145)

## 2018-05-07 MED ORDER — CARVEDILOL 6.25 MG PO TABS: 6.2500 mg | ORAL_TABLET | Freq: Two times a day (BID) | ORAL | 3 refills | Status: DC

## 2018-05-07 MED ORDER — TICAGRELOR 90 MG PO TABS: 90.0000 mg | ORAL_TABLET | Freq: Two times a day (BID) | ORAL | 3 refills | Status: DC

## 2018-05-07 MED ORDER — ISOSORB DINITRATE-HYDRALAZINE 20-37.5 MG PO TABS: 1.0000 | ORAL_TABLET | Freq: Three times a day (TID) | ORAL | 3 refills | Status: DC

## 2018-05-07 MED ORDER — ATORVASTATIN CALCIUM 80 MG PO TABS: 80.0000 mg | ORAL_TABLET | Freq: Every day | ORAL | 3 refills | Status: DC

## 2018-05-07 MED ORDER — TICAGRELOR 90 MG PO TABS: 90.0000 mg | ORAL_TABLET | Freq: Two times a day (BID) | ORAL | 0 refills | Status: DC

## 2018-05-07 NOTE — Progress Notes (Signed)
Nutrition Education Note  RD consulted by RN for nutrition education regarding a Heart Healthy diet.   Lipid Panel     Component Value Date/Time   CHOL 115 05/07/2018 0422   TRIG 158 (H) 05/07/2018 0422   HDL 27 (L) 05/07/2018 0422   CHOLHDL 4.3 05/07/2018 0422   VLDL 32 05/07/2018 0422   LDLCALC 56 05/07/2018 0422    RD provided "Heart Healthy Low Sodium Nutrition Therapy" handout from the Academy of Nutrition and Dietetics. Reviewed patient's dietary recall. Provided examples on ways to decrease sodium and fat intake in diet. Discouraged intake of processed foods and use of salt shaker. Encouraged fresh fruits and vegetables as well as whole grain sources of carbohydrates to maximize fiber intake. Teach back method used.  Expect fair compliance.  Body mass index is 38.38 kg/m. Pt meets criteria for obesity based on current BMI.  Current diet order is heart healthy, patient is consuming approximately 100% of meals at this time. Labs and medications reviewed. No further nutrition interventions warranted at this time. RD contact information provided. If additional nutrition issues arise, please re-consult RD.   Zachary Mccann, RD, LDN, CNSC Pager [phone] After Hours Pager [phone]

## 2018-05-07 NOTE — Progress Notes (Signed)
CARDIAC REHAB PHASE I   PRE:  Rate/Rhythm: 91 SR    BP: sitting 139/80    SaO2:   MODE:  Ambulation: 470 ft   POST:  Rate/Rhythm: 102 ST    BP: sitting 144/90     SaO2:   Pt c/o dizziness going to BR and HA. Did ok walking with slow pace. Ed completed/reviewed. He understands he needs change with his meds, smoking cessation, diet change and ex. Will refer to [facility].  [phone]   Harriet Masson CES, ACSM 05/07/2018 12:29 PM

## 2018-05-07 NOTE — Discharge Summary (Addendum)
Discharge Summary    Patient ID: CARLE LAQUE,  MRN: [id number], DOB/AGE: 1971-02-25 47 y.o.  Admit date: 2018-05-13 Discharge date: 05/07/2018  Primary Care Provider: Patient, No Pcp Per Primary Cardiologist: new to Dr. Herbie Baltimore in the hospital, but patient lives in [place], reportedly has seen Dr. Purvis Sheffield   Discharge Diagnoses    Principal Problem:   Non-ST elevation (NSTEMI) myocardial infarction [facility]) Active Problems:   Essential hypertension   CKD (chronic kidney disease), stage III (HCC) = Secondary to Hypertension   NSTEMI (non-ST elevated myocardial infarction) (HCC)   Allergies Allergies  Allergen Reactions  . Aspirin     Swelling     Diagnostic Studies/Procedures    Heart cath 2018-05-13  Mid Cx to Dist Cx lesion is 100% stenosed.  A drug-eluting stent was successfully placed using a STENT SYNERGY DES 3X24. -Postdilated to 3.3 mm  Post intervention, there is a 0% residual stenosis.  LAV Groove lesion is 65% stenosed - In a small branch beyond the stent.  There is moderate left ventricular systolic dysfunction. The left ventricular ejection fraction is 35-45% by visual estimate.   Severe single-vessel disease with occlusion of the distal Cx  treated with single DES stent. Moderately reduced LVEF with moderately elevated LVEDP.     Plan: Admit to CCU.  Continue IV nitroglycerin overnight.  With aspirin allergy, have changed from Plavix to Brilinta for antiplatelet agent.  Elevated LVEDP, but CKD-3, will hydrate and give 1 dose of IV Lasix. -Consider diuretic for part of antihypertensive therapy (consider chlorthalidone).  Aggressive blood pressure control, have converted from metoprolol to carvedilol along with amlodipine.-Hold off on ARB/ACE inhibitor until renal function stable post cath  High-dose statin   Check 2D echocardiogram to better assess EF   Echo 05/06/18: Study Conclusions - Left ventricle: The cavity size was normal. There  was moderate   concentric hypertrophy. Systolic function was normal. The   estimated ejection fraction was in the range of 50% to 55%. Wall   motion was normal; there were no regional wall motion   abnormalities. Doppler parameters are consistent with abnormal   left ventricular relaxation (grade 1 diastolic dysfunction). - Pericardium, extracardiac: A trivial pericardial effusion was   identified.  History of Present Illness     Alam Bota Wesselmann is a 47 y.o. male with a history of difficult to control hypertension with hypertensive kidney disease, baseline creatinine roughly 1.5-2.0. -Transferred from [facility] emergency room for urgent cardiac catheterization with a diagnosis of non-STEMI.  Mr. Goldenstein recently was in the hospital for pneumonia (roughly 3 weeks ago).  He was recovering from this pneumonia when this past Thursday he started feeling dizzy and short of breath with significant chest pain.  The symptoms became worse over the weekend and this morning he presented to [facility] emergency room with significant chest pain.  He was noted to have an abnormal EKG and elevated troponin levels of roughly 80.76.  He is now brought urgently to [facility] cardiac catheterization lab for urgent cardiac catheterization with non-STEMI.  At the independent emergency room he was given 4000 units of IV heparin and placed on heparin drip.  He was also given 300 mg Plavix (no aspirin due to presumed allergy).   Hospital Course     Consultants: none  NSTEMI Heart catheterization revealed 100% stenoses in the mid-distal Cx, treated with a DES. Noted LAV groove lesion is 65% stenosed. LVEF was noted to be 35-45% by visual estimate. He  tolerated the procedure well. He has ambulated in the hall on room air. Right radial site C/D/I. He is chest pain free. He is discharged on brilinta, but no ASA for allergy.  Chronic diastolic heart failure Echocardiogram with preserved EF of 50-55%, but grade 1 DD.  Encouraged low salt diet. Will continue coreg and bidil.  HTN Medications as above. Encouraged BP log at home.   Medication noncompliance Compliance may be an issue. Stressed importance of medication adherence.     Pt is stable for discharge.  _____________  Discharge Vitals Blood pressure 134/85, pulse 86, temperature 98.7 F (37.1 C), temperature source Oral, resp. rate 17, height 6' (1.829 m), weight 283 lb (128.4 kg), SpO2 98 %.  Filed Weights   05/05/18 0801 05/07/18 0528  Weight: 280 lb (127 kg) 283 lb (128.4 kg)    Labs & Radiologic Studies    CBC Recent Labs    05/05/18 0903 05/06/18 0323  WBC 17.0* 17.6*  NEUTROABS 15.3*  --   HGB 14.7 14.4  HCT 42.7 43.2  MCV 94.5 96.0  PLT 227 198   Basic Metabolic Panel Recent Labs    44/01/02 0323 05/07/18 0422  NA 133* 134*  K 3.9 3.4*  CL 100* 101  CO2 23 22  GLUCOSE 121* 124*  BUN 21* 29*  CREATININE 2.17* 1.94*  CALCIUM 8.4* 8.4*   Liver Function Tests Recent Labs    05/05/18 0903  AST 287*  ALT 52  ALKPHOS 84  BILITOT 1.2  PROT 8.6*  ALBUMIN 3.8   No results for input(s): LIPASE, AMYLASE in the last 72 hours. Cardiac Enzymes Recent Labs    05/05/18 0903  TROPONINI 80.76*   BNP Invalid input(s): POCBNP D-Dimer Recent Labs    05/05/18 1040  DDIMER 0.36   Hemoglobin A1C No results for input(s): HGBA1C in the last 72 hours. Fasting Lipid Panel Recent Labs    05/07/18 0422  CHOL 115  HDL 27*  LDLCALC 56  TRIG 725*  CHOLHDL 4.3   Thyroid Function Tests No results for input(s): TSH, T4TOTAL, T3FREE, THYROIDAB in the last 72 hours.  Invalid input(s): FREET3 _____________  Dg Chest 2 View  Result Date: 05/05/2018 CLINICAL DATA:  Cough and chest pain EXAM: CHEST - 2 VIEW COMPARISON:  03/30/2018 FINDINGS: The heart size and mediastinal contours are within normal limits. Both lungs are clear. The visualized skeletal structures are unremarkable. IMPRESSION: No active cardiopulmonary  disease. Electronically Signed   By: Alcide Clever M.D.   On: 05/05/2018 08:47   Disposition   Pt is being discharged home today in good condition.  Follow-up Plans & Appointments    Follow-up Information    Ellsworth Lennox, PA-C Follow up on 05/28/2018.   Specialties:  Advice worker, Cardiology Why:  2:30 pm for hospital follow Contact information: [address] [place] [address] [phone]          Discharge Instructions    Amb Referral to Cardiac Rehabilitation   Complete by:  As directed    Diagnosis:   Coronary Stents PTCA STEMI        Discharge Medications   Allergies as of 05/07/2018      Reactions   Aspirin    Swelling      Medication List    STOP taking these medications   amLODipine 5 MG tablet Commonly known as:  NORVASC   ibuprofen 200 MG tablet Commonly known as:  ADVIL,MOTRIN   metoprolol tartrate 50 MG tablet Commonly  known as:  LOPRESSOR Replaced by:  carvedilol 6.25 MG tablet     TAKE these medications   atorvastatin 80 MG tablet Commonly known as:  LIPITOR Take 1 tablet (80 mg total) by mouth daily at 6 PM.   carvedilol 6.25 MG tablet Commonly known as:  COREG Take 1 tablet (6.25 mg total) by mouth 2 (two) times daily with a meal. Replaces:  metoprolol tartrate 50 MG tablet   isosorbide-hydrALAZINE 20-37.5 MG tablet Commonly known as:  BIDIL Take 1 tablet by mouth 3 (three) times daily.   ticagrelor 90 MG Tabs tablet Commonly known as:  BRILINTA Take 1 tablet (90 mg total) by mouth 2 (two) times daily.        Aspirin prescribed at discharge?  No: allergy High Intensity Statin Prescribed? (Lipitor 40-80mg  or Crestor 20-40mg ): Yes Beta Blocker Prescribed? Yes For EF <40%, was ACEI/ARB Prescribed? No: normal ef ADP Receptor Inhibitor Prescribed? (i.e. Plavix etc.-Includes Medically Managed Patients): Yes For EF <40%, Aldosterone Inhibitor Prescribed? No: normal ef Was EF assessed during THIS hospitalization?  Yes Was Cardiac Rehab II ordered? (Included Medically managed Patients): Yes   Outstanding Labs/Studies   Medication compliance, HTN  Duration of Discharge Encounter   Greater than 30 minutes including physician time.  Signed, Roe Rutherford [address] [place] 05/07/2018, 12:46 PM  Agree with note by Princella Pellegrini for discharge this morning.  He is postop day 2 circumflex stenting in the setting of an acute inferolateral myocardial infarction.  His EF was preserved by 2D echo although his troponins were high.  His blood pressure is better controlled on his current medications.  Talked about the importance of smoking cessation, dietary modification and medication compliance appears exam is benign.  He will follow-up as an outpatient.  Runell Gess, M.D., FACP, [facility], [facility] [address] [place], [place]  [address]  [phone] 05/07/2018 12:52 PM

## 2018-05-07 NOTE — Progress Notes (Signed)
Progress Note  Patient Name: Zachary Mccann Date of Encounter: 05/07/2018  Primary Cardiologist:Harding  Subjective   47 year old African-American male who lives in [place] and has no primary care doctor postop day 2 circumflex STEMI.  He currently is pain-free.  Inpatient Medications    Scheduled Meds: . atorvastatin  80 mg Oral q1800  . carvedilol  6.25 mg Oral BID WC  . isosorbide-hydrALAZINE  1 tablet Oral TID  . sodium chloride flush  3 mL Intravenous Q12H  . ticagrelor  90 mg Oral BID   Continuous Infusions: . sodium chloride Stopped (05/06/18 0900)   PRN Meds: sodium chloride, acetaminophen, morphine injection, ondansetron (ZOFRAN) IV, sodium chloride flush   Vital Signs    Vitals:   05/06/18 0802 05/06/18 0900 05/06/18 2105 05/07/18 0528  BP:  (!) 134/100 139/86 134/85  Pulse:  84 94 86  Resp:  15 18 17   Temp: 98.2 F (36.8 C)  99.4 F (37.4 C) 98.7 F (37.1 C)  TempSrc: Oral  Oral Oral  SpO2:  94% 100% 98%  Weight:    283 lb (128.4 kg)  Height:        Intake/Output Summary (Last 24 hours) at 05/07/2018 1116 Last data filed at 05/07/2018 0707 Gross per 24 hour  Intake 702 ml  Output 400 ml  Net 302 ml   Filed Weights   05/05/18 0801 05/07/18 0528  Weight: 280 lb (127 kg) 283 lb (128.4 kg)    Telemetry    Sinus rhythm- Personally Reviewed  ECG    Normal sinus rhythm at 78 with inferolateral T wave inversion.- Personally Reviewed  Physical Exam   GEN: No acute distress.   Neck: No JVD Cardiac: RRR, no murmurs, rubs, or gallops.  Respiratory: Clear to auscultation bilaterally. GI: Soft, nontender, non-distended  MS: No edema; No deformity. Neuro:  Nonfocal  Psych: Normal affect  Extremity: Right radial puncture site intact  No change in exam since yesterday  Labs    Chemistry Recent Labs  Lab 05/05/18 0903 05/06/18 0323 05/07/18 0422  NA 134* 133* 134*  K 3.7 3.9 3.4*  CL 99* 100* 101  CO2 24 23 22   GLUCOSE 124* 121*  124*  BUN 20 21* 29*  CREATININE 1.77* 2.17* 1.94*  CALCIUM 9.2 8.4* 8.4*  PROT 8.6*  --   --   ALBUMIN 3.8  --   --   AST 287*  --   --   ALT 52  --   --   ALKPHOS 84  --   --   BILITOT 1.2  --   --   GFRNONAA 44* 34* 39*  GFRAA 51* 40* 46*  ANIONGAP 11 10 11      Hematology Recent Labs  Lab 05/05/18 0903 05/06/18 0323  WBC 17.0* 17.6*  RBC 4.52 4.50  HGB 14.7 14.4  HCT 42.7 43.2  MCV 94.5 96.0  MCH 32.5 32.0  MCHC 34.4 33.3  RDW 13.4 13.6  PLT 227 198    Cardiac Enzymes Recent Labs  Lab 05/05/18 0903  TROPONINI 80.76*    Recent Labs  Lab 05/05/18 0922  TROPIPOC >30.00*     BNPNo results for input(s): BNP, PROBNP in the last 168 hours.   DDimer  Recent Labs  Lab 05/05/18 1040  DDIMER 0.36     Radiology    No results found.  Cardiac Studies   Cardiac catheterization (05/05/2018)  Conclusion     Mid Cx to Dist Cx lesion is 100% stenosed.  A drug-eluting stent was successfully placed using a STENT SYNERGY DES 3X24. -Postdilated to 3.3 mm  Post intervention, there is a 0% residual stenosis.  LAV Groove lesion is 65% stenosed - In a small branch beyond the stent.  There is moderate left ventricular systolic dysfunction. The left ventricular ejection fraction is 35-45% by visual estimate.   Severe single-vessel disease with occlusion of the distal Cx  treated with single DES stent. Moderately reduced LVEF with moderately elevated LVEDP.     Plan: Admit to CCU.  Continue IV nitroglycerin overnight.  With aspirin allergy, have changed from Plavix to Brilinta for antiplatelet agent.  Elevated LVEDP, but CKD-3, will hydrate and give 1 dose of IV Lasix. -Consider diuretic for part of antihypertensive therapy (consider chlorthalidone).  Aggressive blood pressure control, have converted from metoprolol to carvedilol along with amlodipine.-Hold off on ARB/ACE inhibitor until renal function stable post cath  High-dose statin   Check 2D  echocardiogram to better assess EF      Patient Profile     47 y.o. male without prior cardiac history on no medications postop day 2  inferolateral STEMI treated with PCI and drug-eluting stenting of the mid to distal AV groove circumflex.  There is no residual CAD in his remaining vessels.  His LV appeared with reduced on LV gram and there was elevated LV EDP.  His 2D echo however performed yesterday showed fairly preserved ejection fraction.  He currently is pain-free.  Assessment & Plan    1: Coronary artery disease- postop day 1 inferior STEMI.  The patient has a true aspirin allergy and is on Brilinta.  His troponin was greater than 80.  He had no continued chest pain and his EKG shows inferolateral T wave inversion.  Continue guideline directed optimal post MI pharmacology including antiplatelet agents and high-dose statin therapy.  2: Hypertension- his blood pressure diastolic is 100.  He was on amlodipine apparently as an outpatient although he is not taking his medications as directed.  He has chronic renal insufficiency with serum creatinine now of 2.17 although it is been higher recently.  He is not a candidate be on an ACE and/or ARB .  We will begin him on BiDil and carvedilol.  3: Ischemic cardiomyopathy- EF was moderately depressed at the time of cath.  2D echo revealed EF of 50 to 55%.  He is on carvedilol and BiDil.  Normal progression,  cardiac rehab.  Medication compliance will be an issue.  We talked about smoking cessation.  We have also talked about obtaining a primary care physician in [employer].  He stable for discharge this morning.  Follow-up with Dr. Herbie Baltimore.   For questions or updates, please contact [facility] Please consult [email or web address] for contact info under Cardiology/STEMI.      Signed, Nanetta Batty, MD  05/07/2018, 11:16 AM

## 2018-05-13 ENCOUNTER — Emergency Department (HOSPITAL_COMMUNITY): Payer: BLUE CROSS/BLUE SHIELD

## 2018-05-13 ENCOUNTER — Other Ambulatory Visit: Payer: Self-pay

## 2018-05-13 ENCOUNTER — Encounter (HOSPITAL_COMMUNITY): Payer: Self-pay | Admitting: Emergency Medicine

## 2018-05-13 ENCOUNTER — Emergency Department (HOSPITAL_COMMUNITY)
Admission: EM | Admit: 2018-05-13 | Discharge: 2018-05-13 | Disposition: A | Discharge status: Home / Self Care | Payer: BLUE CROSS/BLUE SHIELD | Attending: Emergency Medicine | Admitting: Emergency Medicine

## 2018-05-13 DIAGNOSIS — F17210 Nicotine dependence, cigarettes, uncomplicated: Secondary | ICD-10-CM | POA: Insufficient documentation

## 2018-05-13 DIAGNOSIS — Z7902 Long term (current) use of antithrombotics/antiplatelets: Secondary | ICD-10-CM | POA: Insufficient documentation

## 2018-05-13 DIAGNOSIS — Z955 Presence of coronary angioplasty implant and graft: Secondary | ICD-10-CM | POA: Diagnosis not present

## 2018-05-13 DIAGNOSIS — I214 Non-ST elevation (NSTEMI) myocardial infarction: Secondary | ICD-10-CM | POA: Insufficient documentation

## 2018-05-13 DIAGNOSIS — Z79899 Other long term (current) drug therapy: Secondary | ICD-10-CM | POA: Diagnosis not present

## 2018-05-13 DIAGNOSIS — M25531 Pain in right wrist: Secondary | ICD-10-CM

## 2018-05-13 DIAGNOSIS — G8918 Other acute postprocedural pain: Secondary | ICD-10-CM | POA: Insufficient documentation

## 2018-05-13 DIAGNOSIS — N183 Chronic kidney disease, stage 3 (moderate): Secondary | ICD-10-CM | POA: Diagnosis not present

## 2018-05-13 DIAGNOSIS — I129 Hypertensive chronic kidney disease with stage 1 through stage 4 chronic kidney disease, or unspecified chronic kidney disease: Secondary | ICD-10-CM | POA: Insufficient documentation

## 2018-05-13 DIAGNOSIS — R2231 Localized swelling, mass and lump, right upper limb: Secondary | ICD-10-CM | POA: Diagnosis present

## 2018-05-13 HISTORY — DX: Acute myocardial infarction, unspecified: I21.9

## 2018-05-13 MED ORDER — OXYCODONE-ACETAMINOPHEN 5-325 MG PO TABS
2.0000 | ORAL_TABLET | Freq: Once | ORAL | Status: AC
  Administered 2018-05-13: 2 via ORAL
  Filled 2018-05-13: qty 2

## 2018-05-13 MED ORDER — HYDROCODONE-ACETAMINOPHEN 5-325 MG PO TABS: ORAL_TABLET | ORAL | 0 refills | Status: DC

## 2018-05-13 NOTE — Discharge Instructions (Signed)
Take the prescription as directed. Elevate your forearm/wrist as much as possible.  Call your regular medical doctor and the Cardiologist today to schedule a follow up appointment within the next week.  Return to the Emergency Department immediately if worsening.

## 2018-05-13 NOTE — ED Triage Notes (Signed)
Pt had MI and cardiac cath with stents one week ago. States right hand/arm swelling since yesterday from cath insertion. Old bruising noted. Moderate swelling noted from fingers up to elbow. Pt c/o pain from wrist to shoulder. Radial pulses present. No warmth noted.

## 2018-05-13 NOTE — ED Provider Notes (Signed)
[facility] EMERGENCY DEPARTMENT Provider Note   CSN: [id number] Arrival date & time: 05/13/18  0944     History   Chief Complaint Chief Complaint  Patient presents with  . Arm Swelling    HPI Zachary Mccann is a 47 y.o. male.  HPI  Pt was seen at 1030. Per pt, c/o gradual onset and persistence of constant right wrist "pain" that began yesterday. Pain is located in his right wrist area, and radiates up his arm to his right shoulder. Pt s/p cardiac cath via right radial artery on 05/05/2018. Pt states his wrist area was swollen, "got a little better then swollen up again." Has been associated with bruising to his right wrist and hand areas. Pt is left handed. Denies neck or back pain, no CP/palpitations, no cough/SOB, no injury, no focal motor weakness, no tingling/numbness in extremities.     Past Medical History:  Diagnosis Date  . Hypertension   . Myocardial infarction (HCC)   . Pneumonia     Patient Active Problem List   Diagnosis Date Noted  . Non-ST elevation (NSTEMI) myocardial infarction (HCC) 05/05/2018  . NSTEMI (non-ST elevated myocardial infarction) (HCC) 05/05/2018  . Severe Vitamin D deficiency 04/02/2018  . Community acquired pneumonia of left lower lobe of lung (HCC) 04/02/2018  . CKD (chronic kidney disease), stage III (HCC) = Secondary to Hypertension 04/02/2018  . Pneumonia 03/30/2018  . Metabolic acidosis 03/30/2018  . AKI (acute kidney injury) (HCC) 03/30/2018  . N&V (nausea and vomiting) 03/30/2018  . Essential hypertension 03/30/2018    Past Surgical History:  Procedure Laterality Date  . CORONARY STENT INTERVENTION N/A 05/05/2018   Procedure: CORONARY STENT INTERVENTION;  Surgeon: Marykay Lex, MD;  Location: [facility] INVASIVE CV LAB;  Service: Cardiovascular;  Laterality: N/A;  . LEFT HEART CATH AND CORONARY ANGIOGRAPHY N/A 05/05/2018   Procedure: LEFT HEART CATH AND CORONARY ANGIOGRAPHY;  Surgeon: Marykay Lex, MD;  Location: [facility] INVASIVE CV  LAB;  Service: Cardiovascular;  Laterality: N/A;        Home Medications    Prior to Admission medications   Medication Sig Start Date End Date Taking? Authorizing Provider  atorvastatin (LIPITOR) 80 MG tablet Take 1 tablet (80 mg total) by mouth daily at 6 PM. 05/07/18  Yes Duke, Roe Rutherford, PA  carvedilol (COREG) 6.25 MG tablet Take 1 tablet (6.25 mg total) by mouth 2 (two) times daily with a meal. 05/07/18  Yes Duke, Roe Rutherford, PA  isosorbide-hydrALAZINE (BIDIL) 20-37.5 MG tablet Take 1 tablet by mouth 3 (three) times daily. 05/07/18  Yes Duke, Roe Rutherford, PA  ticagrelor (BRILINTA) 90 MG TABS tablet Take 1 tablet (90 mg total) by mouth 2 (two) times daily. 05/07/18  Yes Duke, Roe Rutherford, PA    Family History Family History  Problem Relation Age of Onset  . Hypertension Mother   . Cancer Mother   . Hypertension Father     Social History Social History   Tobacco Use  . Smoking status: Current Every Day Smoker    Packs/day: 0.50    Types: Cigarettes  . Smokeless tobacco: Never Used  . Tobacco comment: smoked for 10-20 years at 1/2 ppd as of 2019  Substance Use Topics  . Alcohol use: Yes    Comment: occ  . Drug use: Yes    Types: Marijuana    Comment: occ     Allergies   Aspirin   Review of Systems Review of Systems ROS: Statement: All systems negative except  as marked or noted in the HPI; Constitutional: Negative for fever and chills. ; ; Eyes: Negative for eye pain, redness and discharge. ; ; ENMT: Negative for ear pain, hoarseness, nasal congestion, sinus pressure and sore throat. ; ; Cardiovascular: Negative for chest pain, palpitations, diaphoresis, dyspnea and peripheral edema. ; ; Respiratory: Negative for cough, wheezing and stridor. ; ; Gastrointestinal: Negative for nausea, vomiting, diarrhea, abdominal pain, blood in stool, hematemesis, jaundice and rectal bleeding. . ; ; Genitourinary: Negative for dysuria, flank pain and hematuria. ; ;  Musculoskeletal: +right wrist pain. Negative for back pain and neck pain. Negative for deformity and trauma.; ; Skin: +bruising. Negative for pruritus, rash, abrasions, blisters, and skin lesion.; ; Neuro: Negative for headache, lightheadedness and neck stiffness. Negative for weakness, altered level of consciousness, altered mental status, extremity weakness, paresthesias, involuntary movement, seizure and syncope.       Physical Exam Updated Vital Signs BP 115/71 (BP Location: Left Arm)   Pulse 83   Temp 98.6 F (37 C) (Oral)   Resp 18   SpO2 100%   Physical Exam 1035: Physical examination:  Nursing notes reviewed; Vital signs and O2 SAT reviewed;  Constitutional: Well developed, Well nourished, Well hydrated, In no acute distress; Head:  Normocephalic, atraumatic; Eyes: EOMI, PERRL, No scleral icterus; ENMT: Mouth and pharynx normal, Mucous membranes moist; Neck: Supple, Full range of motion, No lymphadenopathy; Cardiovascular: Regular rate and rhythm, No gallop; Respiratory: Breath sounds clear & equal bilaterally, No wheezes.  Speaking full sentences with ease, Normal respiratory effort/excursion; Chest: Nontender, Movement normal; Abdomen: Soft, Nontender, Nondistended, Normal bowel sounds; Genitourinary: No CVA tenderness; Spine:  No midline CS, TS, LS tenderness.;; Extremities: Peripheral pulses normal, No deformity. No edema, NT right shoulder/elbow/hand. +generalized TTP right wrist with localized edema and fading ecchymosis from distal forearm to hand. No open wounds. No soft tissue crepitus. No erythema or warmth. Forearm compartments soft. Strong palp right brachial and radial pulse, no palp thrill. NMS intact right hand..; Neuro: AA&Ox3, Major CN grossly intact.  Speech clear. No gross focal motor or sensory deficits in extremities.; Skin: Color normal, Warm, Dry.   ED Treatments / Results  Labs (all labs ordered are listed, but only abnormal results are  displayed)   EKG None  Radiology   Procedures Procedures (including critical care time)  Medications Ordered in ED Medications  oxyCODONE-acetaminophen (PERCOCET/ROXICET) 5-325 MG per tablet 2 tablet (2 tablets Oral Given 05/13/18 1052)     Initial Impression / Assessment and Plan / ED Course  I have reviewed the triage vital signs and the nursing notes.  Pertinent labs & imaging results that were available during my care of the patient were reviewed by me and considered in my medical decision making (see chart for details).  MDM Reviewed: previous chart, nursing note and vitals Interpretation: ultrasound    Dg Chest 2 View Result Date: 05/05/2018 CLINICAL DATA:  Cough and chest pain EXAM: CHEST - 2 VIEW COMPARISON:  03/30/2018 FINDINGS: The heart size and mediastinal contours are within normal limits. Both lungs are clear. The visualized skeletal structures are unremarkable. IMPRESSION: No active cardiopulmonary disease. Electronically Signed   By: Alcide Clever M.D.   On: 05/05/2018 08:47   Dg Shoulder Right Result Date: 05/13/2018 CLINICAL DATA:  Right arm pain EXAM: RIGHT SHOULDER - 2+ VIEW COMPARISON:  None. FINDINGS: There is no evidence of fracture or dislocation. There is no evidence of arthropathy or other focal bone abnormality. Soft tissues are unremarkable. IMPRESSION: Negative. Electronically  Signed   By: Charlett Nose M.D.   On: 05/13/2018 11:19   Dg Elbow Complete Right Result Date: 05/13/2018 CLINICAL DATA:  Right arm pain EXAM: RIGHT ELBOW - COMPLETE 3+ VIEW COMPARISON:  None. FINDINGS: There is no evidence of fracture, dislocation, or joint effusion. There is no evidence of arthropathy or other focal bone abnormality. Soft tissues are unremarkable. IMPRESSION: Negative. Electronically Signed   By: Charlett Nose M.D.   On: 05/13/2018 11:20   Dg Wrist Complete Right Result Date: 05/13/2018 CLINICAL DATA:  47 year old male with pain, heaviness and swelling of the  right arm. EXAM: RIGHT WRIST - COMPLETE 3+ VIEW COMPARISON:  None. FINDINGS: No evidence of fracture, malalignment or osseous lesion. There is diffuse reticulation of the subcutaneous fat of the visualized distal forearm and wrist consistent with the clinical history of edema. No subcutaneous gas or radiopaque foreign body. IMPRESSION: 1. Soft tissue edema versus cellulitis involving the visualized distal forearm and wrist. 2. No evidence of osseous abnormality. Electronically Signed   By: Malachy Moan M.D.   On: 05/13/2018 11:22   US Venous Img Upper Right (dvt Study) Result Date: 05/13/2018 CLINICAL DATA:  Right upper extremity pain and edema for the past week. Evaluate for DVT. EXAM: RIGHT UPPER EXTREMITY VENOUS DOPPLER ULTRASOUND TECHNIQUE: Gray-scale sonography with graded compression, as well as color Doppler and duplex ultrasound were performed to evaluate the upper extremity deep venous system from the level of the subclavian vein and including the jugular, axillary, basilic, radial, ulnar and upper cephalic vein. Spectral Doppler was utilized to evaluate flow at rest and with distal augmentation maneuvers. COMPARISON:  Right shoulder, elbow and wrist radiographs - earlier same day FINDINGS: Contralateral Subclavian Vein: Respiratory phasicity is normal and symmetric with the symptomatic side. No evidence of thrombus. Normal compressibility. Internal Jugular Vein: No evidence of thrombus. Normal compressibility, respiratory phasicity and response to augmentation. Subclavian Vein: No evidence of thrombus. Normal compressibility, respiratory phasicity and response to augmentation. Axillary Vein: No evidence of thrombus. Normal compressibility, respiratory phasicity and response to augmentation. Cephalic Vein: No evidence of thrombus. Normal compressibility, respiratory phasicity and response to augmentation. Basilic Vein: No evidence of thrombus. Normal compressibility, respiratory phasicity and  response to augmentation. Brachial Veins: No evidence of thrombus. Normal compressibility, respiratory phasicity and response to augmentation. Radial Veins: No evidence of thrombus. Normal compressibility, respiratory phasicity and response to augmentation. Ulnar Veins: No evidence of thrombus. Normal compressibility, respiratory phasicity and response to augmentation. Venous Reflux:  None visualized. Other Findings: A minimal amount of subcutaneous edema is noted at the level of the right forearm and wrist. IMPRESSION: No evidence of DVT within the right upper extremity. Electronically Signed   By: Simonne Come M.D.   On: 05/13/2018 12:23   [place] Upper Ext Art Right Ltd Result Date: 05/13/2018 CLINICAL DATA:  47 year old male with right wrist pain and swelling status post heart catheterization. EXAM: RIGHT UPPER EXTREMITY ARTERIAL DUPLEX SCAN TECHNIQUE: Gray-scale sonography as well as color Doppler and duplex ultrasound was performed to evaluate the arteries of the upper extremity. COMPARISON:  None. FINDINGS: The radial artery is patent and unremarkable in appearance. Normal triphasic arterial waveforms. No evidence of thrombosis or dissection. The radial vein is also within normal limits. There is mild hypoechogenicity interdigitating between the adipose tissue consistent with edema. No evidence of hematoma. IMPRESSION: 1. No evidence of radial artery occlusion, dissection or pseudoaneurysm. 2. The radial veins are patent at the wrist. 3. Superficial subcutaneous edema is present. Signed,  Sterling Big, MD [facility] Radiology Electronically Signed   By: Malachy Moan M.D.   On: 05/13/2018 14:06    1415:  XR and [place] reassuring. No erythema or warmth to suggest cellulitis. Muscles compartments remain soft. Pt feels better after pain meds. Continues to deny CP. T/C returned from Cards Dr. Diona Browner, case discussed, including:  HPI, pertinent PM/SHx,  VS/PE, dx testing, ED course and treatment:  Agrees with ED workup, recommends to elevate extremity, f/u office. Dx and testing, as well as d/w Cards MD, d/w pt.  Questions answered.  Verb understanding, agreeable to d/c home with outpt f/u.      Final Clinical Impressions(s) / ED Diagnoses   Final diagnoses:  None    ED Discharge Orders    None       Samuel Jester, DO 05/17/18 1428

## 2018-05-28 ENCOUNTER — Other Ambulatory Visit: Payer: Self-pay

## 2018-05-28 ENCOUNTER — Encounter: Payer: Self-pay | Admitting: Student

## 2018-05-28 ENCOUNTER — Ambulatory Visit (INDEPENDENT_AMBULATORY_CARE_PROVIDER_SITE_OTHER): Payer: BLUE CROSS/BLUE SHIELD | Admitting: Student

## 2018-05-28 VITALS — BP 148/84 | HR 73 | Ht 72.0 in | Wt 295.4 lb

## 2018-05-28 DIAGNOSIS — N183 Chronic kidney disease, stage 3 unspecified: Secondary | ICD-10-CM

## 2018-05-28 DIAGNOSIS — E785 Hyperlipidemia, unspecified: Secondary | ICD-10-CM

## 2018-05-28 DIAGNOSIS — I2510 Atherosclerotic heart disease of native coronary artery without angina pectoris: Secondary | ICD-10-CM

## 2018-05-28 DIAGNOSIS — Z720 Tobacco use: Secondary | ICD-10-CM

## 2018-05-28 DIAGNOSIS — Z79899 Other long term (current) drug therapy: Secondary | ICD-10-CM

## 2018-05-28 DIAGNOSIS — I10 Essential (primary) hypertension: Secondary | ICD-10-CM

## 2018-05-28 DIAGNOSIS — I214 Non-ST elevation (NSTEMI) myocardial infarction: Secondary | ICD-10-CM | POA: Diagnosis not present

## 2018-05-28 MED ORDER — CARVEDILOL 3.125 MG PO TABS: 3.1250 mg | ORAL_TABLET | Freq: Two times a day (BID) | ORAL | 3 refills | Status: DC

## 2018-05-28 NOTE — Patient Instructions (Addendum)
Medication Instructions:  Your physician has recommended you make the following change in your medication:  1. DECREASE Carvedilol to 3.125 mg twice daily  Labwork: Your physician recommends that you return for lab work in: 2 months for Lipid profile and LFTs.  You will need to be fasting for this blood work.  Testing/Procedures: None ordered  Follow-Up: Your physician recommends that you schedule a new patient appointment in: 3 months with Dr. Wyline Mood.   * If you need a refill on your cardiac medications before your next appointment, please call your pharmacy.   *Please note that any paperwork needing to be filled out by the provider will need to be addressed at the front desk prior to seeing the provider. Please note that any FMLA, disability or other documents regarding health condition is subject to a $25.00 charge that must be received prior to completion of paperwork in the form of a money order or check.  Thank you for choosing [facility]!!

## 2018-05-28 NOTE — Progress Notes (Signed)
Cardiology Office Note    Date:  05/28/2018   ID:  [name], DOB [birth year], MRN [id number]  PCP:  Patient, No Pcp Per  Cardiologist: New to  --> will follow-up with Dr. Wyline Mood  Chief Complaint  Patient presents with  . Follow-up    follow up after cath    History of Present Illness:    Zachary Mccann is a 47 y.o. male with past medical history of HTN, Stage 3 CKD, and tobacco use who presents to the office today for hospital follow-up.  He had previously been admitted for pneumonia in 03/2018 numbers presented back to [facility] on 05/05/2018 for evaluation of new onset dyspnea with associated chest discomfort. His initial troponin value was found to be elevated to 80.76, therefore he was transferred to [facility] for a cardiac catheterization. His procedure showed 100% stenosis of the mid to distal LCx which was treated with placement of a DES. He did have a residual LV groove 65% stenosis beyond the stent which was overall small in diameter and medical therapy was recommended. EF was found to be reduced to 35 to 45% by catheterization but echo imaging the following day reported his EF was at 50 to 55%. He was started on Brilinta during admission but was not placed on ASA given his reported allergy. Lopressor was also switched to Coreg 6.25mg  BID and he was started on BiDil 20-37.5mg  TID.   In the interim, he presented to the ED on 05/13/2018 for evaluation of swelling along his right radial cath site. An arterial US was performed which showed no evidence of an occlusion or pseudoaneurysm and he was discharged home.   In talking with the patient today, he reports overall doing well from a cardiac perspective since his recent hospitalization. Denies any repeat episodes of exertional chest discomfort or dyspnea on exertion. No recent orthopnea, PND, or lower extremity edema. The swelling along his radial cath site has resolved by his report.  His main concern is that he  has been experiencing episodes of erectile dysfunction since being started on several medications during his hospitalization.  He has reduced his smoking use from 1 pack/day to less than 2 to 3 cigarettes/day.   Past Medical History:  Diagnosis Date  . CAD (coronary artery disease)    a. s/p NSTEMI in 04/2018 with DES to LCx.   [name] Hypertension   . Myocardial infarction (HCC)   . Pneumonia     Past Surgical History:  Procedure Laterality Date  . CORONARY STENT INTERVENTION N/A 05/05/2018   Procedure: CORONARY STENT INTERVENTION;  Surgeon: Marykay Lex, MD;  Location: [facility] INVASIVE CV LAB;  Service: Cardiovascular;  Laterality: N/A;  . LEFT HEART CATH AND CORONARY ANGIOGRAPHY N/A 05/05/2018   Procedure: LEFT HEART CATH AND CORONARY ANGIOGRAPHY;  Surgeon: Marykay Lex, MD;  Location: [facility] INVASIVE CV LAB;  Service: Cardiovascular;  Laterality: N/A;    Current Medications: Outpatient Medications Prior to Visit  Medication Sig Dispense Refill  . atorvastatin (LIPITOR) 80 MG tablet Take 1 tablet (80 mg total) by mouth daily at 6 PM. 90 tablet 3  . HYDROcodone-acetaminophen (NORCO/VICODIN) 5-325 MG tablet 1 or 2 tabs PO q6 hours prn pain 15 tablet 0  . isosorbide-hydrALAZINE (BIDIL) 20-37.5 MG tablet Take 1 tablet by mouth 3 (three) times daily. 270 tablet 3  . ticagrelor (BRILINTA) 90 MG TABS tablet Take 1 tablet (90 mg total) by mouth 2 (two) times daily. 180 tablet 3  .  carvedilol (COREG) 6.25 MG tablet Take 1 tablet (6.25 mg total) by mouth 2 (two) times daily with a meal. 180 tablet 3   No facility-administered medications prior to visit.      Allergies:   Aspirin   Social History   Socioeconomic History  . Marital status: Single    Spouse name: Not on file  . Number of children: Not on file  . Years of education: Not on file  . Highest education level: Not on file  Occupational History  . Not on file  Social Needs  . Financial resource strain: Not on file  . Food  insecurity:    Worry: Not on file    Inability: Not on file  . Transportation needs:    Medical: Not on file    Non-medical: Not on file  Tobacco Use  . Smoking status: Current Every Day Smoker    Packs/day: 0.50    Types: Cigarettes  . Smokeless tobacco: Never Used  . Tobacco comment: smoked for 10-20 years at 1/2 ppd as of 2019  Substance and Sexual Activity  . Alcohol use: Yes    Comment: occ  . Drug use: Yes    Types: Marijuana    Comment: occ  . Sexual activity: Not on file  Lifestyle  . Physical activity:    Days per week: Not on file    Minutes per session: Not on file  . Stress: Not on file  Relationships  . Social connections:    Talks on phone: Not on file    Gets together: Not on file    Attends religious service: Not on file    Active member of club or organization: Not on file    Attends meetings of clubs or organizations: Not on file    Relationship status: Not on file  Other Topics Concern  . Not on file  Social History Narrative  . Not on file     Family History:  The patient's family history includes Cancer in his mother; Hypertension in his father and mother.   Review of Systems:   Please see the history of present illness.     General:  No chills, fever, night sweats or weight changes.  Cardiovascular:  No chest pain, dyspnea on exertion, edema, orthopnea, palpitations, paroxysmal nocturnal dyspnea. Positive for erectile dysfunction.  Dermatological: No rash, lesions/masses Respiratory: No cough, dyspnea Urologic: No hematuria, dysuria Abdominal:   No nausea, vomiting, diarrhea, bright red blood per rectum, melena, or hematemesis Neurologic:  No visual changes, wkns, changes in mental status. All other systems reviewed and are otherwise negative except as noted above.   Physical Exam:    VS:  BP (!) 148/84   Pulse 73   Ht 6' (1.829 m)   Wt 295 lb 6.4 oz (134 kg)   BMI 40.06 kg/m    General: Well developed, well nourished [place]  male appearing in no acute distress. Head: Normocephalic, atraumatic, sclera non-icteric, no xanthomas, nares are without discharge.  Neck: No carotid bruits. JVD not elevated.  Lungs: Respirations regular and unlabored, without wheezes or rales.  Heart: Regular rate and rhythm. No S3 or S4.  No murmur, no rubs, or gallops appreciated. Abdomen: Soft, non-tender, non-distended with normoactive bowel sounds. No hepatomegaly. No rebound/guarding. No obvious abdominal masses. Msk:  Strength and tone appear normal for age. No joint deformities or effusions. Extremities: No clubbing or cyanosis. No edema.  Distal pedal pulses are 2+ bilaterally. Radial site stable without ecchymosis or  evidence of a hematoma.  Neuro: Alert and oriented X 3. Moves all extremities spontaneously. No focal deficits noted. Psych:  Responds to questions appropriately with a normal affect. Skin: No rashes or lesions noted  Wt Readings from Last 3 Encounters:  05/28/18 295 lb 6.4 oz (134 kg)  05/07/18 283 lb (128.4 kg)  03/30/18 264 lb 12.4 oz (120.1 kg)     Studies/Labs Reviewed:   EKG:  EKG is not ordered today.   Recent Labs: 04/01/2018: TSH 2.024 05/05/2018: ALT 52 05/06/2018: Hemoglobin 14.4; Platelets 198 05/07/2018: BUN 29; Creatinine, Ser 1.94; Potassium 3.4; Sodium 134   Lipid Panel    Component Value Date/Time   CHOL 115 05/07/2018 0422   TRIG 158 (H) 05/07/2018 0422   HDL 27 (L) 05/07/2018 0422   CHOLHDL 4.3 05/07/2018 0422   VLDL 32 05/07/2018 0422   LDLCALC 56 05/07/2018 0422    Additional studies/ records that were reviewed today include:   Cardiac Catheterization: 05/05/2018  Mid Cx to Dist Cx lesion is 100% stenosed.  A drug-eluting stent was successfully placed using a STENT SYNERGY DES 3X24. -Postdilated to 3.3 mm  Post intervention, there is a 0% residual stenosis.  LAV Groove lesion is 65% stenosed - In a small branch beyond the stent.  There is moderate left ventricular systolic  dysfunction. The left ventricular ejection fraction is 35-45% by visual estimate.   Severe single-vessel disease with occlusion of the distal Cx  treated with single DES stent. Moderately reduced LVEF with moderately elevated LVEDP.     Plan: Admit to CCU.  Continue IV nitroglycerin overnight.  With aspirin allergy, have changed from Plavix to Brilinta for antiplatelet agent.  Elevated LVEDP, but CKD-3, will hydrate and give 1 dose of IV Lasix. -Consider diuretic for part of antihypertensive therapy (consider chlorthalidone).  Aggressive blood pressure control, have converted from metoprolol to carvedilol along with amlodipine.-Hold off on ARB/ACE inhibitor until renal function stable post cath  High-dose statin   Check 2D echocardiogram to better assess EF  Echocardiogram: 05/06/2018 Study Conclusions  - Left ventricle: The cavity size was normal. There was moderate   concentric hypertrophy. Systolic function was normal. The   estimated ejection fraction was in the range of 50% to 55%. Wall   motion was normal; there were no regional wall motion   abnormalities. Doppler parameters are consistent with abnormal   left ventricular relaxation (grade 1 diastolic dysfunction). - Pericardium, extracardiac: A trivial pericardial effusion was   identified.   Upper Extremity Arterial US: 05/13/2018 IMPRESSION: 1. No evidence of radial artery occlusion, dissection or pseudoaneurysm. 2. The radial veins are patent at the wrist. 3. Superficial subcutaneous edema is present.  Assessment:    1. Coronary artery disease involving native coronary artery of native heart without angina pectoris   2. Non-ST elevation myocardial infarction (NSTEMI), subsequent episode of care (HCC)   3. Essential hypertension   4. Hyperlipidemia LDL goal <70   5. Medication management   6. CKD (chronic kidney disease) stage 3, GFR 30-59 ml/min (HCC)   7. Tobacco use      Plan:   In order of problems  listed above:  1. CAD/ Subsequent Episode of Care for NSTEMI - recently admitted for an NSTEMI with peak troponin of 80.76. Catheterization showed 100% stenosis of the mid to distal LCx which was treated with placement of a DES. EF was reduced to 35 to 45% by catheterization but echo imaging the following day reported his EF was at  50 to 55%.  - He denies any episodes of exertional chest discomfort since hospital discharge. Does report some tenderness along his left pectoral region which occurs when sleeping on his chest at night.   - He reports good compliance with Brilinta and denies any evidence of active bleeding. Will continue on Brilinta at current dosing (allergic to ASA). He has experienced episodes of erectile dysfunction which is new since his recent hospitalization and is likely due to the initiation of beta-blocker therapy. Will reduce Coreg to 3.125mg  BID. If symptoms do not improve, could consider switching to Bystolic. If intolerant to all BB's, may need to switch to Amlodipine for additional BP reduction.   2. HTN - BP is slightly elevated at 148/84 during today's visit. I have asked him to follow this in the ambulatory setting with the reduction of Coreg as outlined above. Continue BiDil at current dosing.  3. HLD - FLP in 04/2018 showed total cholesterol 115, triglycerides 158, HDL 27, and LDL 56.  At goal of LDL less than 70. Continue Atorvastatin 80mg  daily. Will recheck FLP and LFT's in 8 weeks.   4. Stage 3 CKD - Creatinine was stable at 1.94 when checked on 05/07/2018.  Was evaluated by Dr. Kristian Covey while admitted in 03/2018. Has not yet established outpatient follow-up.   5. Tobacco Use - he was previously smoking 1 ppd but has reduced this to 2-3 cigarettes per day. Congratulated on this with complete cessation advised.    Medication Adjustments/Labs and Tests Ordered: Current medicines are reviewed at length with the patient today.  Concerns regarding medicines are  outlined above.  Medication changes, Labs and Tests ordered today are listed in the Patient Instructions below. Patient Instructions  Medication Instructions:  Your physician has recommended you make the following change in your medication:  1. DECREASE Carvedilol to 3.125 mg twice daily  Labwork: Your physician recommends that you return for lab work in: 2 months for Lipid profile and LFTs.  You will need to be fasting for this blood work.  Testing/Procedures: None ordered  Follow-Up: Your physician recommends that you schedule a new patient appointment in: 3 months with Dr. Wyline Mood.   * If you need a refill on your cardiac medications before your next appointment, please call your pharmacy.   *Please note that any paperwork needing to be filled out by the provider will need to be addressed at the front desk prior to seeing the provider. Please note that any FMLA, disability or other documents regarding health condition is subject to a $25.00 charge that must be received prior to completion of paperwork in the form of a money order or check.  Thank you for choosing [facility]!!      Signed, Ellsworth Lennox, PA-C  05/28/2018 5:17 PM    [facility] [address] [place], [place] [address] Phone: [phone]

## 2018-09-03 ENCOUNTER — Ambulatory Visit (INDEPENDENT_AMBULATORY_CARE_PROVIDER_SITE_OTHER): Payer: BLUE CROSS/BLUE SHIELD | Admitting: Cardiology

## 2018-09-03 ENCOUNTER — Encounter: Payer: Self-pay | Admitting: Cardiology

## 2018-09-03 VITALS — BP 150/86 | HR 87 | Ht 72.0 in | Wt 291.0 lb

## 2018-09-03 DIAGNOSIS — N183 Chronic kidney disease, stage 3 unspecified: Secondary | ICD-10-CM

## 2018-09-03 DIAGNOSIS — Z79899 Other long term (current) drug therapy: Secondary | ICD-10-CM | POA: Diagnosis not present

## 2018-09-03 DIAGNOSIS — E782 Mixed hyperlipidemia: Secondary | ICD-10-CM

## 2018-09-03 DIAGNOSIS — I2510 Atherosclerotic heart disease of native coronary artery without angina pectoris: Secondary | ICD-10-CM

## 2018-09-03 DIAGNOSIS — I10 Essential (primary) hypertension: Secondary | ICD-10-CM

## 2018-09-03 MED ORDER — PRASUGREL HCL 10 MG PO TABS: ORAL_TABLET | ORAL | 3 refills | Status: DC

## 2018-09-03 MED ORDER — AMLODIPINE BESYLATE 5 MG PO TABS: 5.0000 mg | ORAL_TABLET | Freq: Every day | ORAL | 3 refills | Status: DC

## 2018-09-03 NOTE — Progress Notes (Signed)
Clinical Summary Zachary Mccann is a 47 y.o.male seen today for follow up of the following medical problems.    1. CAD - NSTEMI 04/2018, cath showed occluded LCX. Received DES. LVgram 35-45% - 04/2018 echo LVEF 50-55%, no WMAs, grade I diastolic dysfunction - ASA allergy, has been on brillinta alone. - coreg lowered due to erectile dysfunction. No improvement since lowering meds. Also significant fatigue that is ongoing.   - no recent chest pain. Has had some wheezing, +tobacco history x 20 years. Dry cough.  -  Can have some DOE.   2. HTN - mixed compliance with meds  3. Hyperlipidemia - mixed compliance with statin.    4. CKD 3 - has not established with pcp   5. Tobacco abuse - not ready to quit   -  Past Medical History:  Diagnosis Date  . CAD (coronary artery disease)    a. s/p NSTEMI in 04/2018 with DES to LCx.   [name] Hypertension   . Myocardial infarction (HCC)   . Pneumonia      Allergies  Allergen Reactions  . Aspirin     Swelling      Current Outpatient Medications  Medication Sig Dispense Refill  . atorvastatin (LIPITOR) 80 MG tablet Take 1 tablet (80 mg total) by mouth daily at 6 PM. 90 tablet 3  . carvedilol (COREG) 3.125 MG tablet Take 1 tablet (3.125 mg total) by mouth 2 (two) times daily. 180 tablet 3  . HYDROcodone-acetaminophen (NORCO/VICODIN) 5-325 MG tablet 1 or 2 tabs PO q6 hours prn pain 15 tablet 0  . isosorbide-hydrALAZINE (BIDIL) 20-37.5 MG tablet Take 1 tablet by mouth 3 (three) times daily. 270 tablet 3  . ticagrelor (BRILINTA) 90 MG TABS tablet Take 1 tablet (90 mg total) by mouth 2 (two) times daily. 180 tablet 3   No current facility-administered medications for this visit.      Past Surgical History:  Procedure Laterality Date  . CORONARY STENT INTERVENTION N/A 05/05/2018   Procedure: CORONARY STENT INTERVENTION;  Surgeon: Marykay Lex, MD;  Location: [facility] INVASIVE CV LAB;  Service: Cardiovascular;  Laterality: N/A;  .  LEFT HEART CATH AND CORONARY ANGIOGRAPHY N/A 05/05/2018   Procedure: LEFT HEART CATH AND CORONARY ANGIOGRAPHY;  Surgeon: Marykay Lex, MD;  Location: [facility] INVASIVE CV LAB;  Service: Cardiovascular;  Laterality: N/A;     Allergies  Allergen Reactions  . Aspirin     Swelling       Family History  Problem Relation Age of Onset  . Hypertension Mother   . Cancer Mother   . Hypertension Father      Social History Mr. Massena reports that he has been smoking cigarettes. He has been smoking about 0.50 packs per day. He has never used smokeless tobacco. Mr. Garica reports that he drinks alcohol.   Review of Systems CONSTITUTIONAL: +fatigue  HEENT: Eyes: No visual loss, blurred vision, double vision or yellow sclerae.No hearing loss, sneezing, congestion, runny nose or sore throat.  SKIN: No rash or itching.  CARDIOVASCULAR: per hpi RESPIRATORY: per hpi GASTROINTESTINAL: No anorexia, nausea, vomiting or diarrhea. No abdominal pain or blood.  GENITOURINARY: No burning on urination, no polyuria NEUROLOGICAL: No headache, dizziness, syncope, paralysis, ataxia, numbness or tingling in the extremities. No change in bowel or bladder control.  MUSCULOSKELETAL: No muscle, back pain, joint pain or stiffness.  LYMPHATICS: No enlarged nodes. No history of splenectomy.  PSYCHIATRIC: No history of depression or anxiety.  ENDOCRINOLOGIC: No reports  of sweating, cold or heat intolerance. No polyuria or polydipsia.  [name]   Physical Examination Vitals:   09/03/18 1310  BP: (!) 150/86  Pulse: 87  SpO2: 97%   Vitals:   09/03/18 1310  Weight: 291 lb (132 kg)  Height: 6' (1.829 m)    Gen: resting comfortably, no acute distress HEENT: no scleral icterus, pupils equal round and reactive, no palptable cervical adenopathy,  CV: RRR, no m/r/g, no jvd Resp: Clear to auscultation bilaterally GI: abdomen is soft, non-tender, non-distended, normal bowel sounds, no hepatosplenomegaly MSK:  extremities are warm, no edema.  Skin: warm, no rash Neuro:  no focal deficits Psych: appropriate affect   Diagnostic Studies  04/2018 cath  Mid Cx to Dist Cx lesion is 100% stenosed.  A drug-eluting stent was successfully placed using a STENT SYNERGY DES 3X24. -Postdilated to 3.3 mm  Post intervention, there is a 0% residual stenosis.  LAV Groove lesion is 65% stenosed - In a small Marasia Newhall beyond the stent.  There is moderate left ventricular systolic dysfunction. The left ventricular ejection fraction is 35-45% by visual estimate.   Severe single-vessel disease with occlusion of the distal Cx  treated with single DES stent. Moderately reduced LVEF with moderately elevated LVEDP.    POST CATH PLAN Plan: Admit to CCU.  Continue IV nitroglycerin overnight.  With aspirin allergy, have changed from Plavix to Brilinta for antiplatelet agent.  Elevated LVEDP, but CKD-3, will hydrate and give 1 dose of IV Lasix. -Consider diuretic for part of antihypertensive therapy (consider chlorthalidone).  Aggressive blood pressure control, have converted from metoprolol to carvedilol along with amlodipine.-Hold off on ARB/ACE inhibitor until renal function stable post cath  High-dose statin   Check 2D echocardiogram to better assess EF   04/2018 echo Study Conclusions  - Left ventricle: The cavity size was normal. There was moderate   concentric hypertrophy. Systolic function was normal. The   estimated ejection fraction was in the range of 50% to 55%. Wall   motion was normal; there were no regional wall motion   abnormalities. Doppler parameters are consistent with abnormal   left ventricular relaxation (grade 1 diastolic dysfunction). - Pericardium, extracardiac: A trivial pericardial effusion was   identified.   Assessment and Plan  1. CAD - recent stenting as reported above.  - management complicated by medication side effects. Appears to have fatigue and erectile dysfunction  on even low dose beta blocker. We will d/c. Have not been able to use ACE-I due to poor renal function - suspect SOB/wheezing due to brillinta. We will d/c, start effient 60mg  x1 then 10mg  daily. He has aspirin allergy and has only been on single agent since discharge  2. Hyperlipidemia - continue statin, repeat lipid panel  3. HTN - above goal,stop coreg due to side effects. Start norvasc 5mg  daily  4. CKD 3 - repeat labs, limit neprhotoxic meds.    F/u 6 weeks. Given list of local pcps     Antoine Poche, M.D.

## 2018-09-03 NOTE — Patient Instructions (Signed)
,  Medication Instructions:  STOP BRILINTA STOP COREG  START NORVASC 5 MG DAILY  START EFFIENT 10 MG DAILY ( ON DAY 1- TAKE 60 MG )   Labwork: CMET FASTING LIPID   Testing/Procedures: NONE  Follow-Up: Your physician recommends that you schedule a follow-up appointment in: 3 MONTHS   Any Other Special Instructions Will Be Listed Below (If Applicable).     If you need a refill on your cardiac medications before your next appointment, please call your pharmacy.

## 2018-09-06 ENCOUNTER — Encounter (HOSPITAL_COMMUNITY): Payer: Self-pay | Admitting: *Deleted

## 2018-09-06 ENCOUNTER — Emergency Department (HOSPITAL_COMMUNITY): Payer: BLUE CROSS/BLUE SHIELD

## 2018-09-06 ENCOUNTER — Inpatient Hospital Stay (HOSPITAL_COMMUNITY): Payer: BLUE CROSS/BLUE SHIELD

## 2018-09-06 ENCOUNTER — Inpatient Hospital Stay (HOSPITAL_COMMUNITY)
Admission: EM | Admit: 2018-09-06 | Discharge: 2018-09-08 | DRG: 065 | Disposition: A | Discharge status: Home / Self Care | Payer: BLUE CROSS/BLUE SHIELD | Attending: Family Medicine | Admitting: Family Medicine

## 2018-09-06 ENCOUNTER — Other Ambulatory Visit: Payer: Self-pay

## 2018-09-06 DIAGNOSIS — Z720 Tobacco use: Secondary | ICD-10-CM

## 2018-09-06 DIAGNOSIS — F17210 Nicotine dependence, cigarettes, uncomplicated: Secondary | ICD-10-CM | POA: Diagnosis present

## 2018-09-06 DIAGNOSIS — Z9114 Patient's other noncompliance with medication regimen: Secondary | ICD-10-CM

## 2018-09-06 DIAGNOSIS — I252 Old myocardial infarction: Secondary | ICD-10-CM

## 2018-09-06 DIAGNOSIS — I10 Essential (primary) hypertension: Secondary | ICD-10-CM | POA: Diagnosis not present

## 2018-09-06 DIAGNOSIS — R200 Anesthesia of skin: Secondary | ICD-10-CM | POA: Diagnosis not present

## 2018-09-06 DIAGNOSIS — Z7902 Long term (current) use of antithrombotics/antiplatelets: Secondary | ICD-10-CM | POA: Diagnosis not present

## 2018-09-06 DIAGNOSIS — Z955 Presence of coronary angioplasty implant and graft: Secondary | ICD-10-CM

## 2018-09-06 DIAGNOSIS — I6389 Other cerebral infarction: Secondary | ICD-10-CM | POA: Diagnosis not present

## 2018-09-06 DIAGNOSIS — R42 Dizziness and giddiness: Secondary | ICD-10-CM | POA: Diagnosis present

## 2018-09-06 DIAGNOSIS — R29704 NIHSS score 4: Secondary | ICD-10-CM | POA: Diagnosis present

## 2018-09-06 DIAGNOSIS — I5022 Chronic systolic (congestive) heart failure: Secondary | ICD-10-CM | POA: Diagnosis not present

## 2018-09-06 DIAGNOSIS — R471 Dysarthria and anarthria: Secondary | ICD-10-CM | POA: Diagnosis present

## 2018-09-06 DIAGNOSIS — Z886 Allergy status to analgesic agent status: Secondary | ICD-10-CM | POA: Diagnosis not present

## 2018-09-06 DIAGNOSIS — I161 Hypertensive emergency: Secondary | ICD-10-CM | POA: Diagnosis present

## 2018-09-06 DIAGNOSIS — Z8249 Family history of ischemic heart disease and other diseases of the circulatory system: Secondary | ICD-10-CM | POA: Diagnosis not present

## 2018-09-06 DIAGNOSIS — N183 Chronic kidney disease, stage 3 unspecified: Secondary | ICD-10-CM | POA: Diagnosis present

## 2018-09-06 DIAGNOSIS — I214 Non-ST elevation (NSTEMI) myocardial infarction: Secondary | ICD-10-CM | POA: Diagnosis not present

## 2018-09-06 DIAGNOSIS — I2510 Atherosclerotic heart disease of native coronary artery without angina pectoris: Secondary | ICD-10-CM | POA: Diagnosis present

## 2018-09-06 DIAGNOSIS — I081 Rheumatic disorders of both mitral and tricuspid valves: Secondary | ICD-10-CM | POA: Diagnosis not present

## 2018-09-06 DIAGNOSIS — I639 Cerebral infarction, unspecified: Secondary | ICD-10-CM | POA: Diagnosis not present

## 2018-09-06 DIAGNOSIS — I6340 Cerebral infarction due to embolism of unspecified cerebral artery: Secondary | ICD-10-CM | POA: Diagnosis not present

## 2018-09-06 DIAGNOSIS — R278 Other lack of coordination: Secondary | ICD-10-CM | POA: Diagnosis not present

## 2018-09-06 DIAGNOSIS — R531 Weakness: Secondary | ICD-10-CM | POA: Diagnosis not present

## 2018-09-06 DIAGNOSIS — Z8673 Personal history of transient ischemic attack (TIA), and cerebral infarction without residual deficits: Secondary | ICD-10-CM | POA: Diagnosis present

## 2018-09-06 DIAGNOSIS — I130 Hypertensive heart and chronic kidney disease with heart failure and stage 1 through stage 4 chronic kidney disease, or unspecified chronic kidney disease: Secondary | ICD-10-CM | POA: Diagnosis present

## 2018-09-06 DIAGNOSIS — I63512 Cerebral infarction due to unspecified occlusion or stenosis of left middle cerebral artery: Secondary | ICD-10-CM | POA: Diagnosis not present

## 2018-09-06 LAB — I-STAT CHEM 8, ED
BUN: 19 mg/dL (ref 6–20)
Calcium, Ion: 1.13 mmol/L — ABNORMAL LOW (ref 1.15–1.40)
Chloride: 107 mmol/L (ref 98–111)
Creatinine, Ser: 1.9 mg/dL — ABNORMAL HIGH (ref 0.61–1.24)
Glucose, Bld: 101 mg/dL — ABNORMAL HIGH (ref 70–99)
HEMATOCRIT: 44 % (ref 39.0–52.0)
HEMOGLOBIN: 15 g/dL (ref 13.0–17.0)
POTASSIUM: 3.5 mmol/L (ref 3.5–5.1)
SODIUM: 141 mmol/L (ref 135–145)
TCO2: 23 mmol/L (ref 22–32)

## 2018-09-06 LAB — DIFFERENTIAL
Basophils Absolute: 0 10*3/uL (ref 0.0–0.1)
Basophils Relative: 0 %
EOS ABS: 0.1 10*3/uL (ref 0.0–0.7)
EOS PCT: 1 %
LYMPHS PCT: 24 %
Lymphs Abs: 1.6 10*3/uL (ref 0.7–4.0)
MONO ABS: 0.4 10*3/uL (ref 0.1–1.0)
Monocytes Relative: 6 %
NEUTROS PCT: 69 %
Neutro Abs: 4.4 10*3/uL (ref 1.7–7.7)

## 2018-09-06 LAB — CBC
HCT: 42.2 % (ref 39.0–52.0)
Hemoglobin: 14.4 g/dL (ref 13.0–17.0)
MCH: 32.1 pg (ref 26.0–34.0)
MCHC: 34.1 g/dL (ref 30.0–36.0)
MCV: 94.2 fL (ref 78.0–100.0)
PLATELETS: 213 10*3/uL (ref 150–400)
RBC: 4.48 MIL/uL (ref 4.22–5.81)
RDW: 13.2 % (ref 11.5–15.5)
WBC: 6.5 10*3/uL (ref 4.0–10.5)

## 2018-09-06 LAB — URINALYSIS, ROUTINE W REFLEX MICROSCOPIC
BACTERIA UA: NONE SEEN
BILIRUBIN URINE: NEGATIVE
Glucose, UA: NEGATIVE mg/dL
KETONES UR: NEGATIVE mg/dL
LEUKOCYTES UA: NEGATIVE
Nitrite: NEGATIVE
Protein, ur: NEGATIVE mg/dL
Specific Gravity, Urine: 1.008 (ref 1.005–1.030)
pH: 6 (ref 5.0–8.0)

## 2018-09-06 LAB — COMPREHENSIVE METABOLIC PANEL
ALBUMIN: 4 g/dL (ref 3.5–5.0)
ALT: 32 U/L (ref 0–44)
ANION GAP: 9 (ref 5–15)
AST: 23 U/L (ref 15–41)
Alkaline Phosphatase: 118 U/L (ref 38–126)
BUN: 19 mg/dL (ref 6–20)
CO2: 24 mmol/L (ref 22–32)
CREATININE: 1.78 mg/dL — AB (ref 0.61–1.24)
Calcium: 9.3 mg/dL (ref 8.9–10.3)
Chloride: 107 mmol/L (ref 98–111)
GFR calc non Af Amer: 44 mL/min — ABNORMAL LOW (ref >60)
GFR, EST AFRICAN AMERICAN: 51 mL/min — AB (ref >60)
Glucose, Bld: 105 mg/dL — ABNORMAL HIGH (ref 70–99)
POTASSIUM: 3.6 mmol/L (ref 3.5–5.1)
SODIUM: 140 mmol/L (ref 135–145)
Total Bilirubin: 1.1 mg/dL (ref 0.3–1.2)
Total Protein: 8.2 g/dL — ABNORMAL HIGH (ref 6.5–8.1)

## 2018-09-06 LAB — PROTIME-INR
INR: 1.05
Prothrombin Time: 13.6 seconds (ref 11.4–15.2)

## 2018-09-06 LAB — RAPID URINE DRUG SCREEN, HOSP PERFORMED
Amphetamines: NOT DETECTED
BENZODIAZEPINES: NOT DETECTED
Barbiturates: NOT DETECTED
COCAINE: NOT DETECTED
Opiates: NOT DETECTED
Tetrahydrocannabinol: POSITIVE — AB

## 2018-09-06 LAB — APTT: aPTT: 31 seconds (ref 24–36)

## 2018-09-06 LAB — I-STAT TROPONIN, ED: Troponin i, poc: 0.01 ng/mL (ref 0.00–0.08)

## 2018-09-06 LAB — ETHANOL

## 2018-09-06 LAB — CBG MONITORING, ED: GLUCOSE-CAPILLARY: 107 mg/dL — AB (ref 70–99)

## 2018-09-06 MED ORDER — LABETALOL HCL 5 MG/ML IV SOLN
10.0000 mg | Freq: Once | INTRAVENOUS | Status: AC
  Administered 2018-09-06: 10 mg via INTRAVENOUS

## 2018-09-06 MED ORDER — LORAZEPAM 2 MG/ML IJ SOLN: 0.5000 mg | Freq: Once | INTRAMUSCULAR | Status: DC | PRN

## 2018-09-06 MED ORDER — HYDRALAZINE HCL 20 MG/ML IJ SOLN
5.0000 mg | INTRAMUSCULAR | Status: DC | PRN
  Administered 2018-09-06 – 2018-09-07 (×2): 5 mg via INTRAVENOUS
  Administered 2018-09-08: 20 mg via INTRAVENOUS
  Filled 2018-09-06 (×2): qty 1

## 2018-09-06 MED ORDER — HYDROCODONE-ACETAMINOPHEN 5-325 MG PO TABS: 1.0000 | ORAL_TABLET | Freq: Four times a day (QID) | ORAL | Status: DC | PRN

## 2018-09-06 MED ORDER — PRASUGREL HCL 10 MG PO TABS
10.0000 mg | ORAL_TABLET | Freq: Every day | ORAL | Status: DC
  Filled 2018-09-06: qty 1

## 2018-09-06 MED ORDER — ATORVASTATIN CALCIUM 80 MG PO TABS
80.0000 mg | ORAL_TABLET | Freq: Every day | ORAL | Status: DC
  Administered 2018-09-06 – 2018-09-07 (×2): 80 mg via ORAL
  Filled 2018-09-06 (×2): qty 2

## 2018-09-06 MED ORDER — ONDANSETRON HCL 4 MG PO TABS: 4.0000 mg | ORAL_TABLET | Freq: Four times a day (QID) | ORAL | Status: DC | PRN

## 2018-09-06 MED ORDER — SODIUM CHLORIDE 0.45 % IV SOLN
INTRAVENOUS | Status: DC
  Administered 2018-09-06 – 2018-09-07 (×2): via INTRAVENOUS

## 2018-09-06 MED ORDER — STROKE: EARLY STAGES OF RECOVERY BOOK
Freq: Once | Status: AC
  Administered 2018-09-06: 20:00:00
  Filled 2018-09-06: qty 1

## 2018-09-06 MED ORDER — LABETALOL HCL 5 MG/ML IV SOLN
INTRAVENOUS | Status: AC
  Filled 2018-09-06: qty 4

## 2018-09-06 MED ORDER — ONDANSETRON HCL 4 MG/2ML IJ SOLN
4.0000 mg | Freq: Four times a day (QID) | INTRAMUSCULAR | Status: DC | PRN
  Administered 2018-09-08: 4 mg via INTRAVENOUS

## 2018-09-06 MED ORDER — NICOTINE 14 MG/24HR TD PT24
14.0000 mg | MEDICATED_PATCH | Freq: Every day | TRANSDERMAL | Status: DC
  Administered 2018-09-06 – 2018-09-08 (×3): 14 mg via TRANSDERMAL
  Filled 2018-09-06 (×3): qty 1

## 2018-09-06 MED ORDER — HEPARIN SODIUM (PORCINE) 5000 UNIT/ML IJ SOLN
5000.0000 [IU] | Freq: Three times a day (TID) | INTRAMUSCULAR | Status: DC
  Administered 2018-09-06 – 2018-09-08 (×6): 5000 [IU] via SUBCUTANEOUS
  Filled 2018-09-06 (×6): qty 1

## 2018-09-06 NOTE — ED Triage Notes (Signed)
Pt with left sided weakness and slurred speech since 1630 yesterday, pt states that he didn't come then was because he thought he was tired.

## 2018-09-06 NOTE — H&P (Addendum)
History and Physical    Zachary Mccann WJX:[id number] DOB: 21-Jul-1971 DOA: 09/06/2018  PCP: Patient, No Pcp Per Patient coming from: home   Chief Complaint: Gait instability and weakness  HPI: Zachary Mccann is a 47 y.o. male with medical history significant of CAD/MI s/p NSTEMI w/ DES placed on 04/2018, HTN, tobacco use.   After acute onset neurological deficit symptoms at approximately 17:30 on 09/05/2018. Initial sx of  Difficulty getting out of his car. Symptoms were described as left sided weakness and feeling off balance. Patient endorses several similar episodes over the course of the night when he has to get up to urinate. At times felt better buthas consistently had symptoms of a foggy headed feeling and worsening headache. Currently symptoms are constant and getting worse.family members were with patient also endorses slurred speech intermittently. Denies any loss of consciousness, blurred vision.  Of note patient had an and STEMI on 05/05/2018 and underwent cardia catheterization and placement of DES. Patient endorses intermittent compliancewith his medical regimen since that time including stopping his Derinda Late to on his own secondary to erectile dysfunction symptoms.   ED Course: objective findings outlined below. Patient given single dose of Lopressor secondary to markedly elevated blood pressure. Stroke protocol initiated. Neurology team consult.  Review of Systems: As per HPI otherwise all other systems reviewed and are negative  Ambulatory Status:no restrictions  Past Medical History:  Diagnosis Date  . CAD (coronary artery disease)    a. s/p NSTEMI in 04/2018 with DES to LCx.   [name] Hypertension   . Myocardial infarction (HCC)   . Pneumonia     Past Surgical History:  Procedure Laterality Date  . CORONARY STENT INTERVENTION N/A 05/05/2018   Procedure: CORONARY STENT INTERVENTION;  Surgeon: Marykay Lex, MD;  Location: [facility] INVASIVE CV LAB;  Service: Cardiovascular;   Laterality: N/A;  . LEFT HEART CATH AND CORONARY ANGIOGRAPHY N/A 05/05/2018   Procedure: LEFT HEART CATH AND CORONARY ANGIOGRAPHY;  Surgeon: Marykay Lex, MD;  Location: [facility] INVASIVE CV LAB;  Service: Cardiovascular;  Laterality: N/A;    Social History   Socioeconomic History  . Marital status: Single    Spouse name: Not on file  . Number of children: Not on file  . Years of education: Not on file  . Highest education level: Not on file  Occupational History  . Not on file  Social Needs  . Financial resource strain: Not on file  . Food insecurity:    Worry: Not on file    Inability: Not on file  . Transportation needs:    Medical: Not on file    Non-medical: Not on file  Tobacco Use  . Smoking status: Current Every Day Smoker    Packs/day: 0.50    Types: Cigarettes  . Smokeless tobacco: Never Used  . Tobacco comment: smoked for 10-20 years at 1/2 ppd as of 2019  Substance and Sexual Activity  . Alcohol use: Yes    Comment: occ  . Drug use: Yes    Types: Marijuana    Comment: occ  . Sexual activity: Not on file  Lifestyle  . Physical activity:    Days per week: Not on file    Minutes per session: Not on file  . Stress: Not on file  Relationships  . Social connections:    Talks on phone: Not on file    Gets together: Not on file    Attends religious service: Not on file    Active  member of club or organization: Not on file    Attends meetings of clubs or organizations: Not on file    Relationship status: Not on file  . Intimate partner violence:    Fear of current or ex partner: Not on file    Emotionally abused: Not on file    Physically abused: Not on file    Forced sexual activity: Not on file  Other Topics Concern  . Not on file  Social History Narrative  . Not on file    Allergies  Allergen Reactions  . Aspirin     Swelling     Family History  Problem Relation Age of Onset  . Hypertension Mother   . Cancer Mother   . Hypertension Father        Prior to Admission medications   Medication Sig Start Date End Date Taking? Authorizing Provider  acetaminophen (TYLENOL) 325 MG tablet Take 650 mg by mouth every 6 (six) hours as needed for mild pain.   Yes [provider]  amLODipine (NORVASC) 5 MG tablet Take 1 tablet (5 mg total) by mouth daily. 09/03/18 12/02/18 Yes BranchDorothe Pea, MD  atorvastatin (LIPITOR) 80 MG tablet Take 1 tablet (80 mg total) by mouth daily at 6 PM. 05/07/18  Yes Duke, Roe Rutherford, PA  prasugrel (EFFIENT) 10 MG TABS tablet On day 1 take 60 mg (6 tablets) , then take 10 mg daily. 09/03/18  Yes BranchDorothe Pea, MD  HYDROcodone-acetaminophen (NORCO/VICODIN) 5-325 MG tablet 1 or 2 tabs PO q6 hours prn pain 05/13/18   Samuel Jester, DO  isosorbide-hydrALAZINE (BIDIL) 20-37.5 MG tablet Take 1 tablet by mouth 3 (three) times daily. 05/07/18   Marcelino Duster, PA    Physical Exam: Vitals:   09/06/18 1519 09/06/18 1523 09/06/18 1532 09/06/18 1552  BP: (!) 180/114  (!) 179/118 (!) 176/11  Pulse:   76 75  Resp:   12 18  Temp:  97.6 F (36.4 C)    TempSrc:      SpO2:   99% 99%  Weight:      Height:         General:  Appears calm and comfortable Eyes:  PERRL, EOMI, normal lids, iris ENT:  grossly normal hearing, lips & tongue, mmm Neck:  no LAD, masses or thyromegaly Cardiovascular:  RRR, no m/r/g. No LE edema.  Respiratory:  CTA bilaterally, no w/r/r. Normal respiratory effort. Abdomen:  soft, ntnd, NABS Skin:  no rash or induration seen on limited exam Musculoskeletal:  grossly normal tone BUE/BLE, good ROM, no bony abnormality Psychiatric:  grossly normal mood and affect, speech fluent and appropriate, AOx3 Neurologic: cranial nerves II through XII grossly intact, significant dysmetria on the left, left leg raise 4 out of 5 with right leg raise 5 out of 5, left grip strength 4 out of 5 and right grip strength 5 out of 5.  I did not have pt stand but per EDP report pt had difficult  standing due to balance w/ ataxia on ambulation.  Labs on Admission: I have personally reviewed following labs and imaging studies  CBC: Recent Labs  Lab 09/06/18 1511 09/06/18 1526  WBC 6.5  --   NEUTROABS 4.4  --   HGB 14.4 15.0  HCT 42.2 44.0  MCV 94.2  --   PLT 213  --    Basic Metabolic Panel: Recent Labs  Lab 09/06/18 1511 09/06/18 1526  NA 140 141  K 3.6 3.5  CL 107 107  CO2 24  --   GLUCOSE 105* 101*  BUN 19 19  CREATININE 1.78* 1.90*  CALCIUM 9.3  --    GFR: Estimated Creatinine Clearance: 66.4 mL/min (A) (by C-G formula based on SCr of 1.9 mg/dL (H)). Liver Function Tests: Recent Labs  Lab 09/06/18 1511  AST 23  ALT 32  ALKPHOS 118  BILITOT 1.1  PROT 8.2*  ALBUMIN 4.0   No results for input(s): LIPASE, AMYLASE in the last 168 hours. No results for input(s): AMMONIA in the last 168 hours. Coagulation Profile: Recent Labs  Lab 09/06/18 1511  INR 1.05   Cardiac Enzymes: No results for input(s): CKTOTAL, CKMB, CKMBINDEX, TROPONINI in the last 168 hours. BNP (last 3 results) No results for input(s): PROBNP in the last 8760 hours. HbA1C: No results for input(s): HGBA1C in the last 72 hours. CBG: Recent Labs  Lab 09/06/18 1528  GLUCAP 107*   Lipid Profile: No results for input(s): CHOL, HDL, LDLCALC, TRIG, CHOLHDL, LDLDIRECT in the last 72 hours. Thyroid Function Tests: No results for input(s): TSH, T4TOTAL, FREET4, T3FREE, THYROIDAB in the last 72 hours. Anemia Panel: No results for input(s): VITAMINB12, FOLATE, FERRITIN, TIBC, IRON, RETICCTPCT in the last 72 hours. Urine analysis:    Component Value Date/Time   COLORURINE YELLOW 03/31/2018 0211   APPEARANCEUR CLOUDY (A) 03/31/2018 0211   LABSPEC 1.010 03/31/2018 0211   PHURINE 5.0 03/31/2018 0211   GLUCOSEU NEGATIVE 03/31/2018 0211   HGBUR MODERATE (A) 03/31/2018 0211   BILIRUBINUR NEGATIVE 03/31/2018 0211   KETONESUR NEGATIVE 03/31/2018 0211   PROTEINUR 30 (A) 03/31/2018 0211    NITRITE NEGATIVE 03/31/2018 0211   LEUKOCYTESUR NEGATIVE 03/31/2018 0211    Creatinine Clearance: Estimated Creatinine Clearance: 66.4 mL/min (A) (by C-G formula based on SCr of 1.9 mg/dL (H)).  Sepsis Labs: @LABRCNTIP (procalcitonin:4,lacticidven:4) )No results found for this or any previous visit (from the past 240 hour(s)).   Radiological Exams on Admission: Ct Head Code Stroke Wo Contrast  Result Date: 09/06/2018 CLINICAL DATA:  Code stroke. 47 year old male with left side weakness since 1730 hours yesterday. EXAM: CT HEAD WITHOUT CONTRAST TECHNIQUE: Contiguous axial images were obtained from the base of the skull through the vertex without intravenous contrast. COMPARISON:  None. FINDINGS: Brain: Partially empty sella. Curvilinear hypodensity in the right basal ganglia best demonstrated on coronal image 34 most resembles a chronic lacunar infarct. Minimal cerebral white matter hypodensity elsewhere. Cerebral cortical gray matter appears normal throughout. However, there is a 2 centimeter wedge-shaped area of hypodensity in the left superior cerebellum (series 2, image 11 and sagittal image 39). Mild regional mass effect but no hemorrhagic transformation. Basilar cisterns in the 4th ventricle remain patent. Other posterior fossa gray-white matter differentiation is normal. No midline shift, ventriculomegaly, mass effect, evidence of mass lesion, intracranial hemorrhage. Vascular: No suspicious intracranial vascular hyperdensity. Skull: Negative. Sinuses/Orbits: Left maxillary mucoperiosteal thickening. Other visualized paranasal sinuses and mastoids are well pneumatized. Other: Visualized orbits and scalp soft tissues are within normal limits. ASPECTS [facility] Stroke Program Early CT Score) Total score (0-10 with 10 being normal): 10, although there is cytotoxic edema in the left superior cerebellar artery territory. IMPRESSION: 1. Acute to subacute left cerebellar, SCA territory infarct. Mild mass  effect, no associated hemorrhage. 2. Chronic appearing right basal ganglia lacunar infarct. No other No acute intracranial abnormality. 3. ASPECTS is 10, although there is cytotoxic edema in the left superior cerebellar artery territory. 4. Study discussed by telephone with Dr. Eber Hong on 09/06/2018 at 15:11 . Electronically  Signed   By: Odessa Fleming M.D.   On: 09/06/2018 15:12    EKG: Independently reviewed. Sinus. No ACS  Assessment/Plan Active Problems:   Essential hypertension   CKD (chronic kidney disease), stage III (HCC) = Secondary to Hypertension   Non-ST elevation (NSTEMI) myocardial infarction (HCC)   Cerebellar stroke (HCC)   Tobacco use   Cerebellar stroke: onset 17:30 09/05/18. Presented to ED on 09/06/18 w/ progressive sx. CT as above. Neuro consulted and appreciate their assistance once [facility] arrives [facility].  - Stroke order set - Permissive HTN - Notify Neuro when pt arrives at [facility] - Continue statin,  - No Effient as below. (allergic to Aspirin)  Recent MI: s/p DES placement on 04/2018. Pt has been non-compliant w/ regimen secondary to ED complaints. Pt unsure of which medications he is taking. See Dr Verna Czech clinic note from 09/03/18. - Pt education - Hold Effient due to increase bleed risk in stroke pts and since pt wasn't even taking it at time of stroke. Pt refusing Aspirin due to allergy and Brillinta due to ED and making him feel "funny".  - Cards consult in am  HTN: pt non-compliant w/ home BP regimen - As above - Pt education prior to DC to ensure compliance  CKD3: Cr 1.8. At baseline. Suspect secondary to HTN - BMP in am  Tobacco: - nicotine patch  DVT prophylaxis: Hep  Code Status: FUll  Family Communication: sister   Disposition Plan: pending transfer to [facility]  Consults called: Neuro  Admission status: Inpt    Ozella Rocks MD [facility]  If 7PM-7AM, please contact night-coverage [email or web address] Password [facility]  09/06/2018, 5:41 PM

## 2018-09-06 NOTE — Progress Notes (Signed)
D/w Dr. Konrad Dolores about the effient since it's contraindicated in CVA patients due to increase risk of bleeding. He will further discuss with cards and neurology for alternative.   Ulyses Southward, PharmD, [place], AAHIVP, CPP Infectious Disease Pharmacist Pager: [phone] 09/06/2018 7:07 PM

## 2018-09-06 NOTE — ED Notes (Signed)
Report given to [facility]  

## 2018-09-06 NOTE — ED Provider Notes (Signed)
[facility] EMERGENCY DEPARTMENT Provider Note   CSN: [id number] Arrival date & time: 09/06/18  1439     History   Chief Complaint Chief Complaint  Patient presents with  . Cerebrovascular Accident    HPI LADARRYL WRAGE is a 47 y.o. male.  HPI  The patient is a 47 year old male, he has a known history of coronary disease status post stenting, he has high blood pressure, he is admitting to using marijuana but denies any other drug use.  He states he has not had his medications yet today but does take them regularly including his blood pressure medications.   According to the medical record which I just reviewed, he was recently seen by cardiology, referring to Dr. Verna Czech notes he had a history of a non-ST elevation MI in May 2019, had an occluded left circumflex and had a drug-eluting stent.  His ejection fraction by echocardiogram was 50 to 55% and grade 1 diastolic dysfunction.  He has had an aspirin allergy and has been on Brilinta alone.  Effient was started in price of the Brilinta given side effects.  Beta-blocker was stopped because of erectile dysfunction and fatigue, Norvasc was started at 5 mg daily.  The patient states that yesterday he got into the car to go to the restaurant, when he tried to get out of the car at 530 a short time later he was unable to walk because of significant left-sided weakness, he reports that he felt like he was totally off balance and since that time he has been unable to walk feeling like he has to hold onto things.  He has no changes in his vision, he does not have any vertigo, he feels like his left arm and left leg are not working right.  He also felt like he possibly had some speech issues, he feels like symptoms are worsened today.  He does not have a headache, he does not have any blurred vision, he feels like he might be seeing some sparkles in his vision occasionally.  He denies injury.  Symptoms are persistent, gradually worsening, nothing  makes this better or worse.  Past Medical History:  Diagnosis Date  . CAD (coronary artery disease)    a. s/p NSTEMI in 04/2018 with DES to LCx.   [name] Hypertension   . Myocardial infarction (HCC)   . Pneumonia     Patient Active Problem List   Diagnosis Date Noted  . Non-ST elevation (NSTEMI) myocardial infarction (HCC) 05/05/2018  . NSTEMI (non-ST elevated myocardial infarction) (HCC) 05/05/2018  . Severe Vitamin D deficiency 04/02/2018  . Community acquired pneumonia of left lower lobe of lung (HCC) 04/02/2018  . CKD (chronic kidney disease), stage III (HCC) = Secondary to Hypertension 04/02/2018  . Pneumonia 03/30/2018  . Metabolic acidosis 03/30/2018  . AKI (acute kidney injury) (HCC) 03/30/2018  . N&V (nausea and vomiting) 03/30/2018  . Essential hypertension 03/30/2018    Past Surgical History:  Procedure Laterality Date  . CORONARY STENT INTERVENTION N/A 05/05/2018   Procedure: CORONARY STENT INTERVENTION;  Surgeon: Marykay Lex, MD;  Location: [facility] INVASIVE CV LAB;  Service: Cardiovascular;  Laterality: N/A;  . LEFT HEART CATH AND CORONARY ANGIOGRAPHY N/A 05/05/2018   Procedure: LEFT HEART CATH AND CORONARY ANGIOGRAPHY;  Surgeon: Marykay Lex, MD;  Location: [facility] INVASIVE CV LAB;  Service: Cardiovascular;  Laterality: N/A;        Home Medications    Prior to Admission medications   Medication Sig Start Date End  Date Taking? Authorizing Provider  acetaminophen (TYLENOL) 325 MG tablet Take 650 mg by mouth every 6 (six) hours as needed for mild pain.   Yes [provider]  amLODipine (NORVASC) 5 MG tablet Take 1 tablet (5 mg total) by mouth daily. 09/03/18 12/02/18 Yes BranchDorothe Pea, MD  atorvastatin (LIPITOR) 80 MG tablet Take 1 tablet (80 mg total) by mouth daily at 6 PM. 05/07/18  Yes Duke, Roe Rutherford, PA  prasugrel (EFFIENT) 10 MG TABS tablet On day 1 take 60 mg (6 tablets) , then take 10 mg daily. 09/03/18  Yes BranchDorothe Pea, MD    HYDROcodone-acetaminophen (NORCO/VICODIN) 5-325 MG tablet 1 or 2 tabs PO q6 hours prn pain 05/13/18   Samuel Jester, DO  isosorbide-hydrALAZINE (BIDIL) 20-37.5 MG tablet Take 1 tablet by mouth 3 (three) times daily. 05/07/18   Duke, Roe Rutherford, PA    Family History Family History  Problem Relation Age of Onset  . Hypertension Mother   . Cancer Mother   . Hypertension Father     Social History Social History   Tobacco Use  . Smoking status: Current Every Day Smoker    Packs/day: 0.50    Types: Cigarettes  . Smokeless tobacco: Never Used  . Tobacco comment: smoked for 10-20 years at 1/2 ppd as of 2019  Substance Use Topics  . Alcohol use: Yes    Comment: occ  . Drug use: Yes    Types: Marijuana    Comment: occ     Allergies   Aspirin   Review of Systems Review of Systems  All other systems reviewed and are negative.    Physical Exam Updated Vital Signs BP (!) 179/118 (BP Location: Right Arm)   Pulse 76   Temp 97.6 F (36.4 C)   Resp 12   Ht 1.829 m (6')   Wt 127.5 kg   SpO2 99%   BMI 38.11 kg/m   Physical Exam  Constitutional: He appears well-developed and well-nourished. No distress.  HENT:  Head: Normocephalic and atraumatic.  Mouth/Throat: Oropharynx is clear and moist. No oropharyngeal exudate.  Eyes: Pupils are equal, round, and reactive to light. Conjunctivae and EOM are normal. Right eye exhibits no discharge. Left eye exhibits no discharge. No scleral icterus.  Neck: Normal range of motion. Neck supple. No JVD present. No thyromegaly present.  Cardiovascular: Normal rate, regular rhythm, normal heart sounds and intact distal pulses. Exam reveals no gallop and no friction rub.  No murmur heard. Pulmonary/Chest: Effort normal and breath sounds normal. No respiratory distress. He has no wheezes. He has no rales.  Abdominal: Soft. Bowel sounds are normal. He exhibits no distension and no mass. There is no tenderness.  Musculoskeletal: Normal  range of motion. He exhibits no edema or tenderness.  Lymphadenopathy:    He has no cervical adenopathy.  Neurological: He is alert. Coordination normal.  Normal strength in all 4 extremities, he is able to straight leg raise bilaterally, he has normal grips bilaterally, he has no pronator drift however he has dysmetria of the left hand and is unable to perform finger-nose-finger secondary to significant dysmetria.  There is a possible very subtle flattening of the left nasolabial fold.  There is difficulty performing heel shin on the left as well, when I stand the patient up he feels off balance and ataxic and has a difficult time walking.  Peripheral visual fields are normal, his extraocular movements are normal, his gross visual acuity is normal, cranial nerves III through  XII appear normal except for the slight flattening of the left nasolabial fold.  Skin: Skin is warm and dry. No rash noted. No erythema.  Psychiatric: He has a normal mood and affect. His behavior is normal.  Nursing note and vitals reviewed.    ED Treatments / Results  Labs (all labs ordered are listed, but only abnormal results are displayed) Labs Reviewed  COMPREHENSIVE METABOLIC PANEL - Abnormal; Notable for the following components:      Result Value   Glucose, Bld 105 (*)    Creatinine, Ser 1.78 (*)    Total Protein 8.2 (*)    GFR calc non Af Amer 44 (*)    GFR calc Af Amer 51 (*)    All other components within normal limits  CBG MONITORING, ED - Abnormal; Notable for the following components:   Glucose-Capillary 107 (*)    All other components within normal limits  I-STAT CHEM 8, ED - Abnormal; Notable for the following components:   Creatinine, Ser 1.90 (*)    Glucose, Bld 101 (*)    Calcium, Ion 1.13 (*)    All other components within normal limits  PROTIME-INR  APTT  CBC  DIFFERENTIAL  ETHANOL  RAPID URINE DRUG SCREEN, HOSP PERFORMED  URINALYSIS, ROUTINE W REFLEX MICROSCOPIC  I-STAT TROPONIN, ED    I-STAT CHEM 8, ED  I-STAT TROPONIN, ED    EKG EKG Interpretation  Date/Time:  Saturday September 06 2018 14:55:21 EDT Ventricular Rate:  99 PR Interval:    QRS Duration: 114 QT Interval:  370 QTC Calculation: 475 R Axis:   79 Text Interpretation:  Sinus rhythm Biatrial enlargement Borderline intraventricular conduction delay Low voltage with right axis deviation Confirmed by Eber Hong ([id number]) on 09/06/2018 2:58:38 PM   Radiology Ct Head Code Stroke Wo Contrast  Result Date: 09/06/2018 CLINICAL DATA:  Code stroke. 47 year old male with left side weakness since 1730 hours yesterday. EXAM: CT HEAD WITHOUT CONTRAST TECHNIQUE: Contiguous axial images were obtained from the base of the skull through the vertex without intravenous contrast. COMPARISON:  None. FINDINGS: Brain: Partially empty sella. Curvilinear hypodensity in the right basal ganglia best demonstrated on coronal image 34 most resembles a chronic lacunar infarct. Minimal cerebral white matter hypodensity elsewhere. Cerebral cortical gray matter appears normal throughout. However, there is a 2 centimeter wedge-shaped area of hypodensity in the left superior cerebellum (series 2, image 11 and sagittal image 39). Mild regional mass effect but no hemorrhagic transformation. Basilar cisterns in the 4th ventricle remain patent. Other posterior fossa gray-white matter differentiation is normal. No midline shift, ventriculomegaly, mass effect, evidence of mass lesion, intracranial hemorrhage. Vascular: No suspicious intracranial vascular hyperdensity. Skull: Negative. Sinuses/Orbits: Left maxillary mucoperiosteal thickening. Other visualized paranasal sinuses and mastoids are well pneumatized. Other: Visualized orbits and scalp soft tissues are within normal limits. ASPECTS [facility] Stroke Program Early CT Score) Total score (0-10 with 10 being normal): 10, although there is cytotoxic edema in the left superior cerebellar artery territory.  IMPRESSION: 1. Acute to subacute left cerebellar, SCA territory infarct. Mild mass effect, no associated hemorrhage. 2. Chronic appearing right basal ganglia lacunar infarct. No other No acute intracranial abnormality. 3. ASPECTS is 10, although there is cytotoxic edema in the left superior cerebellar artery territory. 4. Study discussed by telephone with Dr. Eber Hong on 09/06/2018 at 15:11 . Electronically Signed   By: Odessa Fleming M.D.   On: 09/06/2018 15:12    Procedures .Critical Care Performed by: Eber Hong, MD Authorized by: Hyacinth Meeker,  Arlys John, MD   Critical care provider statement:    Critical care time (minutes):  35   Critical care time was exclusive of:  Separately billable procedures and treating other patients and teaching time   Critical care was necessary to treat or prevent imminent or life-threatening deterioration of the following conditions:  CNS failure or compromise   Critical care was time spent personally by me on the following activities:  Blood draw for specimens, development of treatment plan with patient or surrogate, discussions with consultants, evaluation of patient's response to treatment, examination of patient, obtaining history from patient or surrogate, ordering and performing treatments and interventions, ordering and review of laboratory studies, ordering and review of radiographic studies, pulse oximetry, re-evaluation of patient's condition and review of old charts   (including critical care time)  Medications Ordered in ED Medications  labetalol (NORMODYNE,TRANDATE) injection 10 mg (10 mg Intravenous Given 09/06/18 1514)     Initial Impression / Assessment and Plan / ED Course  I have reviewed the triage vital signs and the nursing notes.  Pertinent labs & imaging results that were available during my care of the patient were reviewed by me and considered in my medical decision making (see chart for details).     On my exam the patient deftly has focal  abnormalities, I spoke with the neuro hospitalist Dr. Laurence Slate at 3:05 PM, he recommends local admission, blood pressure control to maintain a blood pressure around 220/120 but to try to not to go much lower than that however his blood pressure is currently 220/153, a single dose of antihypertensive has been given and the patient will go to CT scan.  He recommends against CT angiogram at this time given his chronic kidney dysfunction and the length of time since onset.  I discussed care with the admitting doctor, Dr. Margot Ables, he agrees that the patient likely needs to go to [facility] for further evaluation.  The patient's blood pressure which was severely elevated has improved with a single dose of labetalol.  The patient is critically ill with an acute cerebellar ischemic infarct.  He has been treated for his hypertensive emergency.  Final Clinical Impressions(s) / ED Diagnoses   Final diagnoses:  Acute ischemic stroke [facility])      Eber Hong, MD 09/06/18 [phone]

## 2018-09-06 NOTE — Progress Notes (Signed)
CODE STROKE 1451 CALL TIME 1451 BEEPER 1454 EXAM STARTED 1456 EXAM FINISHED 1456 IMAGES SENT TO SOC 1459 IMAGES COMPLETED IN EPIC 1500 [facility] CALLED.

## 2018-09-07 DIAGNOSIS — I6340 Cerebral infarction due to embolism of unspecified cerebral artery: Secondary | ICD-10-CM

## 2018-09-07 NOTE — Evaluation (Signed)
Physical Therapy Evaluation Patient Details Name: Zachary Mccann MRN: [id number] DOB: 10/01/1971 Today's Date: 09/07/2018   History of Present Illness  Zachary Mccann is a 47 y.o. male with medical history significant of CAD/MI s/p NSTEMI w/ DES placed on 04/2018, HTN, tobacco use who presents with gait instability and weakness; MRI showing Left SCA distribution acute/early subacute infarction.  Clinical Impression  Pt admitted with above diagnosis. Pt currently with functional limitations due to the deficits listed below (see PT Problem List). Independent prior to this stroke; Presents with  dominant L sided weakness, impaired coordination, decreased activity tolerance, impaired balance, decreased safety awareness, and impaired cognition (difficulty word finding, decreased problem solving).  Difficulty with x1 VOR exercise as well; Pt will benefit from skilled PT to increase their independence and safety with mobility to allow discharge to the venue listed below.       Follow Up Recommendations Outpatient PT    Equipment Recommendations  Rolling walker with 5" wheels    Recommendations for Other Services OT consult(as ordered)     Precautions / Restrictions Precautions Precautions: Fall Precaution Comments: watch BP Restrictions Weight Bearing Restrictions: No      Mobility  Bed Mobility Overal bed mobility: Needs Assistance Bed Mobility: Supine to Sit     Supine to sit: Supervision     General bed mobility comments: supervision for safety, no assist required   Transfers Overall transfer level: Needs assistance Equipment used: Rolling walker (2 wheeled) Transfers: Sit to/from Stand Sit to Stand: Min assist;+2 safety/equipment         General transfer comment: cueing for hand placement and safety, min assist for safety and balance  Ambulation/Gait Ambulation/Gait assistance: Min guard;+2 safety/equipment Gait Distance (Feet): 65 Feet Assistive device: Rolling  walker (2 wheeled) Gait Pattern/deviations: Step-through pattern     General Gait Details: Decr cadence and noted dependence on RW for UE support  Stairs            Wheelchair Mobility    Modified Rankin (Stroke Patients Only)       Balance Overall balance assessment: Needs assistance Sitting-balance support: No upper extremity supported;Feet supported Sitting balance-Leahy Scale: Fair     Standing balance support: No upper extremity supported;During functional activity Standing balance-Leahy Scale: Fair Standing balance comment: min guard for safety during grooming at sink, 0 hand support; preference for hand support                             Pertinent Vitals/Pain Pain Assessment: 0-10 Pain Score: 6  Pain Location: right side of head Pain Descriptors / Indicators: Aching;Headache Pain Intervention(s): Monitored during session    Home Living Family/patient expects to be discharged to:: Private residence Living Arrangements: Spouse/significant other(fiancee) Available Help at Discharge: Available 24 hours/day(fiancee) Type of Home: House Home Access: Level entry     Home Layout: One level Home Equipment: Production assistant, radio - single point      Prior Function Level of Independence: Independent         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   Dominant Hand: Left    Extremity/Trunk Assessment   Upper Extremity Assessment Upper Extremity Assessment: Defer to OT evaluation LUE Deficits / Details: grossly 4/5 MMT, functional use but noted dysmetria  LUE Sensation: WNL LUE Coordination: decreased gross motor    Lower Extremity Assessment Lower Extremity Assessment: LLE deficits/detail LLE Deficits / Details: Decr strength in comparison  to RLE, grossly 4/5; dysmetria with heel to shin task    Cervical / Trunk Assessment Cervical / Trunk Assessment: Normal  Communication   Communication: No difficulties   Cognition Arousal/Alertness: Awake/alert Behavior During Therapy: WFL for tasks assessed/performed Overall Cognitive Status: Impaired/Different from baseline Area of Impairment: Problem solving                             Problem Solving: Slow processing;Requires verbal cues General Comments: pt requires increased time for sequencing and problem sovling, reports word finding challenges       General Comments General comments (skin integrity, edema, etc.): watch BP, elevated to 175/134 after mobility; RN notifed and aware     Exercises     Assessment/Plan    PT Assessment Patient needs continued PT services  PT Problem List Decreased strength;Decreased range of motion;Decreased activity tolerance;Decreased mobility;Decreased balance;Decreased coordination;Decreased cognition;Decreased knowledge of use of DME;Decreased safety awareness;Decreased knowledge of precautions       PT Treatment Interventions DME instruction;Gait training;Stair training;Functional mobility training;Therapeutic activities;Therapeutic exercise;Balance training;Neuromuscular re-education;Cognitive remediation;Patient/family education    PT Goals (Current goals can be found in the Care Plan section)  Acute Rehab PT Goals Patient Stated Goal: to get home  PT Goal Formulation: With patient Time For Goal Achievement: 09/21/18 Potential to Achieve Goals: Good    Frequency Min 4X/week   Barriers to discharge        Co-evaluation PT/OT/SLP Co-Evaluation/Treatment: Yes Reason for Co-Treatment: For patient/therapist safety   OT goals addressed during session: ADL's and self-care;Other (comment)(transfers/mobility)       AM-PAC PT "6 Clicks" Daily Activity  Outcome Measure Difficulty turning over in bed (including adjusting bedclothes, sheets and blankets)?: A Little Difficulty moving from lying on back to sitting on the side of the bed? : A Little Difficulty sitting down on and standing up  from a chair with arms (e.g., wheelchair, bedside commode, etc,.)?: A Lot Help needed moving to and from a bed to chair (including a wheelchair)?: A Lot Help needed walking in hospital room?: A Little Help needed climbing 3-5 steps with a railing? : A Lot 6 Click Score: 15    End of Session Equipment Utilized During Treatment: Gait belt Activity Tolerance: Patient tolerated treatment well Patient left: in chair;with call bell/phone within reach;with chair alarm set Nurse Communication: Mobility status PT Visit Diagnosis: Unsteadiness on feet (R26.81);Other abnormalities of gait and mobility (R26.89);Hemiplegia and hemiparesis Hemiplegia - Right/Left: Left Hemiplegia - dominant/non-dominant: Dominant Hemiplegia - caused by: Cerebral infarction    Time: 8616-8372 PT Time Calculation (min) (ACUTE ONLY): 34 min   Charges:   PT Evaluation $PT Eval Moderate Complexity: 1 Mod          Van Clines, PT  Acute Rehabilitation Services Pager [phone] Office [phone]   [name] 09/07/2018, 4:20 PM

## 2018-09-07 NOTE — Progress Notes (Signed)
PROGRESS NOTE  Zachary Mccann  MRN:[id number] DOB: 06/10/1971 DOA: 09/06/2018 PCP: Patient, No Pcp Per   Brief Narrative: Zachary Mccann is a 47 y.o. male with a history of CAD s/p NSTEMI with DES to LCx May 2019, HTN, tobacco use, and medication nonadherence who presented to [facility] ED on 9/21 with dizziness, gait instability, and diffuse L > R weakness that began the previous evening. He was found to have an acute-subacute left cerebellar SCE territory infarct with mild mass effect in addition to old right basal ganglia lacunar infarct. He was given labetalol for hypertensive emergency and transferred to [facility].   Assessment & Plan: Active Problems:   Essential hypertension   CKD (chronic kidney disease), stage III (HCC) = Secondary to Hypertension   Non-ST elevation (NSTEMI) myocardial infarction (HCC)   Cerebellar stroke (HCC)   Tobacco use  Left cerebellar SCA territory CVA, acute-subacute: With persistent dysmetria, dizziness, gait instability.  - Not currently on antiplatelet due to allergy to aspirin, intolerance to brilinta, and contraindication to effient. Defer to neurology.  - Further neuroimaging per neurology - Neurology planning TEE 9/23. - HbA1c (previous was 5.5%), lipid panel (last LDL was 56). Continue high dose statin.  - PT/OT/SLP evaluation. Up w/assistance.  CAD s/p NSTEMI with DES to Mid-distal LCx 100% stenosis May 2019: No chest pain, dyspnea.  - Has had intolerance to antiplatelets and beta blockers.  - Continue statin  ICM, chronic HFrEF: EF 35-45% on LHC angiography in May.  - Monitor I/O, daily weights, DC IVF's  HTN with hypertensive emergency: Given some labetalol in ED.  - Continue permissive HTN pending neurology evaluation. Hydralazine prn to maintain BP < 220/120. - Holding home bidil and norvasc  Stage II CKD: Cr at baseline with CrCl 66ml/min.  - Monitor  Tobacco use:  - Cessation counseling provided - Nicotine patch  DVT prophylaxis:  Heparin Code Status: Full Family Communication: None at bedside Disposition Plan: Therapy evaluations pending  Consultants:   Neurology  Procedures:   None  Antimicrobials:  None   Subjective: Feels well, didn't sleep well. Hungry. Denies trouble speaking or swallowing, still feels very dizzy and clumsy with the left arm/hand.   Objective: Vitals:   09/07/18 0200 09/07/18 0400 09/07/18 0844 09/07/18 1202  BP: (!) 183/110 (!) 161/95 (!) 188/115 (!) 173/114  Pulse: 80 83 92 88  Resp: 16 18 20 18  Temp: 98.7 F (37.1 C) 98.3 F (36.8 C)  98.5 F (36.9 C)  TempSrc: Oral Oral  Oral  SpO2: 100% 98%  98%  Weight:      Height:        Intake/Output Summary (Last 24 hours) at 09/07/2018 1300 Last data filed at 09/07/2018 0413 Gross per 24 hour  Intake 947.8 ml  Output 1100 ml  Net -152.2 ml   Filed Weights   09/06/18 1452  Weight: 127.5 kg    Gen: 47 y.o. male in no distress Pulm: Non-labored breathing. Clear to auscultation bilaterally.  CV: Regular rate and rhythm. No murmur, rub, or gallop. No JVD, no pedal edema. GI: Abdomen soft, non-tender, non-distended, with normoactive bowel sounds. No organomegaly or masses felt. Ext: Warm, no deformities Skin: No rashes, lesions or ulcers Neuro: Alert and oriented. CNs intact grossly with no nystagmus, smooth pursuit EOMI. Left arm/hand strength 5/5 but significant dysmetria on FTN, mild clumsiness with heel to shin, though flexes hip off bed against resistance and no sensory deficits. Psych: Judgement and insight appear normal. Mood & affect appropriate.     Data Reviewed: I have personally reviewed following labs and imaging studies  CBC: Recent Labs  Lab 09/06/18 1511 09/06/18 1526  WBC 6.5  --   NEUTROABS 4.4  --   HGB 14.4 15.0  HCT 42.2 44.0  MCV 94.2  --   PLT 213  --    Basic Metabolic Panel: Recent Labs  Lab 09/06/18 1511 09/06/18 1526  NA 140 141  K 3.6 3.5  CL 107 107  CO2 24  --   GLUCOSE 105*  101*  BUN 19 19  CREATININE 1.78* 1.90*  CALCIUM 9.3  --    GFR: Estimated Creatinine Clearance: 66.4 mL/min (A) (by C-G formula based on SCr of 1.9 mg/dL (H)). Liver Function Tests: Recent Labs  Lab 09/06/18 1511  AST 23  ALT 32  ALKPHOS 118  BILITOT 1.1  PROT 8.2*  ALBUMIN 4.0   No results for input(s): LIPASE, AMYLASE in the last 168 hours. No results for input(s): AMMONIA in the last 168 hours. Coagulation Profile: Recent Labs  Lab 09/06/18 1511  INR 1.05   Cardiac Enzymes: No results for input(s): CKTOTAL, CKMB, CKMBINDEX, TROPONINI in the last 168 hours. BNP (last 3 results) No results for input(s): PROBNP in the last 8760 hours. HbA1C: No results for input(s): HGBA1C in the last 72 hours. CBG: Recent Labs  Lab 09/06/18 1528  GLUCAP 107*   Lipid Profile: No results for input(s): CHOL, HDL, LDLCALC, TRIG, CHOLHDL, LDLDIRECT in the last 72 hours. Thyroid Function Tests: No results for input(s): TSH, T4TOTAL, FREET4, T3FREE, THYROIDAB in the last 72 hours. Anemia Panel: No results for input(s): VITAMINB12, FOLATE, FERRITIN, TIBC, IRON, RETICCTPCT in the last 72 hours. Urine analysis:    Component Value Date/Time   COLORURINE STRAW (A) 09/06/2018 2222   APPEARANCEUR CLEAR 09/06/2018 2222   LABSPEC 1.008 09/06/2018 2222   PHURINE 6.0 09/06/2018 2222   GLUCOSEU NEGATIVE 09/06/2018 2222   HGBUR SMALL (A) 09/06/2018 2222   BILIRUBINUR NEGATIVE 09/06/2018 2222   KETONESUR NEGATIVE 09/06/2018 2222   PROTEINUR NEGATIVE 09/06/2018 2222   NITRITE NEGATIVE 09/06/2018 2222   LEUKOCYTESUR NEGATIVE 09/06/2018 2222   No results found for this or any previous visit (from the past 240 hour(s)).    Radiology Studies: [name]  Result Date: 09/07/2018 CLINICAL DATA:  47 y/o M; left-sided weakness. Stroke for follow-up. EXAM: MRI HEAD WITHOUT CONTRAST MRA HEAD WITHOUT CONTRAST TECHNIQUE: Multiplanar, multiecho pulse sequences of the brain and surrounding  structures were obtained without intravenous contrast. Angiographic images of the head were obtained using MRA technique without contrast. COMPARISON:  None. FINDINGS: MRI HEAD FINDINGS Brain: Left SCA distribution reduced diffusion compatible with acute/early subacute infarction. Small chronic infarction involving the right lentiform nucleus extending into the corona radiata. Very small chronic infarctions of the left caudate head and left pons. No associated hemorrhage or mass effect. Several nonspecific T2 FLAIR hyperintensities in subcortical and periventricular white matter are compatible with moderate chronic microvascular ischemic changes for age. Mild volume loss of the brain. Vascular: As below. Skull and upper cervical spine: Normal marrow signal. Sinuses/Orbits: Mild ethmoid sinus mucosal thickening and partial opacification of the left maxillary sinus. No abnormal signal of the mastoid air cells. Orbits are unremarkable. Other: None. MRA HEAD FINDINGS Internal carotid arteries:  Patent. Anterior cerebral arteries:  Patent. Middle cerebral arteries: Patent. Posterior cerebral arteries:  Patent. Basilar artery:  Patent. Vertebral arteries:  Patent. Extensive motion artifact, suboptimal assessment for stenosis or small aneurysm. IMPRESSION: MRI head: 1.   Left SCA distribution acute/early subacute infarction is stable in distribution. No associated hemorrhage or mass effect. 2. Small chronic infarction involving the right lentiform nucleus extending into the corona radiata. Very small chronic infarcts of the left caudate head and left pons. 3. Moderate for age chronic microvascular ischemic changes and mild volume loss of the brain. MRA head: 1. Extensive motion artifact, suboptimal assessment for stenosis or small aneurysm. 2. No large vessel intracranial occlusion identified. Electronically Signed   By: Lance  Furusawa-Stratton M.D.   On: 09/07/2018 00:09   Mr Mra Head Wo Contrast  Result Date:  09/07/2018 CLINICAL DATA:  47 y/o M; left-sided weakness. Stroke for follow-up. EXAM: MRI HEAD WITHOUT CONTRAST MRA HEAD WITHOUT CONTRAST TECHNIQUE: Multiplanar, multiecho pulse sequences of the brain and surrounding structures were obtained without intravenous contrast. Angiographic images of the head were obtained using MRA technique without contrast. COMPARISON:  None. FINDINGS: MRI HEAD FINDINGS Brain: Left SCA distribution reduced diffusion compatible with acute/early subacute infarction. Small chronic infarction involving the right lentiform nucleus extending into the corona radiata. Very small chronic infarctions of the left caudate head and left pons. No associated hemorrhage or mass effect. Several nonspecific T2 FLAIR hyperintensities in subcortical and periventricular white matter are compatible with moderate chronic microvascular ischemic changes for age. Mild volume loss of the brain. Vascular: As below. Skull and upper cervical spine: Normal marrow signal. Sinuses/Orbits: Mild ethmoid sinus mucosal thickening and partial opacification of the left maxillary sinus. No abnormal signal of the mastoid air cells. Orbits are unremarkable. Other: None. MRA HEAD FINDINGS Internal carotid arteries:  Patent. Anterior cerebral arteries:  Patent. Middle cerebral arteries: Patent. Posterior cerebral arteries:  Patent. Basilar artery:  Patent. Vertebral arteries:  Patent. Extensive motion artifact, suboptimal assessment for stenosis or small aneurysm. IMPRESSION: MRI head: 1. Left SCA distribution acute/early subacute infarction is stable in distribution. No associated hemorrhage or mass effect. 2. Small chronic infarction involving the right lentiform nucleus extending into the corona radiata. Very small chronic infarcts of the left caudate head and left pons. 3. Moderate for age chronic microvascular ischemic changes and mild volume loss of the brain. MRA head: 1. Extensive motion artifact, suboptimal assessment  for stenosis or small aneurysm. 2. No large vessel intracranial occlusion identified. Electronically Signed   By: Lance  Furusawa-Stratton M.D.   On: 09/07/2018 00:09   Ct Head Code Stroke Wo Contrast  Result Date: 09/06/2018 CLINICAL DATA:  Code stroke. 47-year-old male with left side weakness since 1730 hours yesterday. EXAM: CT HEAD WITHOUT CONTRAST TECHNIQUE: Contiguous axial images were obtained from the base of the skull through the vertex without intravenous contrast. COMPARISON:  None. FINDINGS: Brain: Partially empty sella. Curvilinear hypodensity in the right basal ganglia best demonstrated on coronal image 34 most resembles a chronic lacunar infarct. Minimal cerebral white matter hypodensity elsewhere. Cerebral cortical gray matter appears normal throughout. However, there is a 2 centimeter wedge-shaped area of hypodensity in the left superior cerebellum (series 2, image 11 and sagittal image 39). Mild regional mass effect but no hemorrhagic transformation. Basilar cisterns in the 4th ventricle remain patent. Other posterior fossa gray-white matter differentiation is normal. No midline shift, ventriculomegaly, mass effect, evidence of mass lesion, intracranial hemorrhage. Vascular: No suspicious intracranial vascular hyperdensity. Skull: Negative. Sinuses/Orbits: Left maxillary mucoperiosteal thickening. Other visualized paranasal sinuses and mastoids are well pneumatized. Other: Visualized orbits and scalp soft tissues are within normal limits. ASPECTS (Alberta Stroke Program Early CT Score) Total score (0-10 with 10 being normal): 10, although there   is cytotoxic edema in the left superior cerebellar artery territory. IMPRESSION: 1. Acute to subacute left cerebellar, SCA territory infarct. Mild mass effect, no associated hemorrhage. 2. Chronic appearing right basal ganglia lacunar infarct. No other No acute intracranial abnormality. 3. ASPECTS is 10, although there is cytotoxic edema in the left  superior cerebellar artery territory. 4. Study discussed by telephone with Dr. BRIAN MILLER on 09/06/2018 at 15:11 . Electronically Signed   By: H  Hall M.D.   On: 09/06/2018 15:12    Scheduled Meds: . atorvastatin  80 mg Oral q1800  . heparin  5,000 Units Subcutaneous Q8H  . nicotine  14 mg Transdermal Daily   Continuous Infusions: . sodium chloride 75 mL/hr at 09/07/18 1004     LOS: 1 day   Time spent: 25 minutes.  Ardith Lewman B Jazzlene Huot, MD [facility] [email or web address] Password [email or web address] 09/07/2018, 1:00 PM  

## 2018-09-07 NOTE — Evaluation (Addendum)
Occupational Therapy Evaluation Patient Details Name: Zachary Mccann MRN: [id number] DOB: 1971/03/16 Today's Date: 09/07/2018    History of Present Illness Zachary PLASKETT is a 47 y.o. male with medical history significant of CAD/MI s/p NSTEMI w/ DES placed on 04/2018, HTN, tobacco use who presents with gait instability and weakness; MRI showing Left SCA distribution acute/early subacute infarction.   Clinical Impression   PTA patient reports independent and working. He was admitted for above and limited by dominant L sided weakness, impaired coordination, decreased activity tolerance, impaired balance, decreased safety awareness, and impaired cognition (difficulty word finding, decreased problem solving).  He currently requires min assist for UB ADL, mod assist for LB ADLs, min guard for standing grooming, min A +2 safety for toilet transfers and supervision for bed mobility.  He will have support from fiancee at home, 24/7. He will benefit from continued OT services while admitted in order to maximize safety and independence with ADLs while admitted, and recommend continued Outpatient OT services after dc.  Will continue to follow and updated recommendations as needed.     Follow Up Recommendations  Outpatient OT;Supervision/Assistance - 24 hour    Equipment Recommendations  3 in 1 bedside commode    Recommendations for Other Services       Precautions / Restrictions Precautions Precautions: Fall Precaution Comments: watch BP Restrictions Weight Bearing Restrictions: No      Mobility Bed Mobility Overal bed mobility: Needs Assistance Bed Mobility: Supine to Sit     Supine to sit: Supervision     General bed mobility comments: supervision for safety, no assist required   Transfers Overall transfer level: Needs assistance Equipment used: Rolling walker (2 wheeled) Transfers: Sit to/from Stand Sit to Stand: Min assist;+2 safety/equipment         General transfer  comment: cueing for hand placement and safety, min assist for safety and balance    Balance Overall balance assessment: Needs assistance Sitting-balance support: No upper extremity supported;Feet supported Sitting balance-Leahy Scale: Fair     Standing balance support: No upper extremity supported;During functional activity Standing balance-Leahy Scale: Fair Standing balance comment: min guard for safety during grooming at sink, 0 hand support; preference for hand support                           ADL either performed or assessed with clinical judgement   ADL Overall ADL's : Needs assistance/impaired Eating/Feeding: Supervision/ safety;Sitting   Grooming: Min guard;Standing   Upper Body Bathing: Sitting;Minimal assistance   Lower Body Bathing: Sit to/from stand;Moderate assistance   Upper Body Dressing : Minimal assistance;Sitting   Lower Body Dressing: Sit to/from stand;Moderate assistance   Toilet Transfer: Minimal assistance;+2 for safety/equipment;Ambulation;RW(simulated to recliner )   Toileting- Clothing Manipulation and Hygiene: Minimal assistance;Sit to/from stand       Functional mobility during ADLs: Minimal assistance;+2 for safety/equipment;Rolling walker General ADL Comments: Patient with decreased balance, dysmetric dominant L UE, and decreased activity tolerance.     Vision Baseline Vision/History: No visual deficits Patient Visual Report: Blurring of vision Vision Assessment?: Yes Eye Alignment: Within Functional Limits Ocular Range of Motion: Within Functional Limits Alignment/Gaze Preference: Within Defined Limits Tracking/Visual Pursuits: (decreased smoothness to track towards L side ) Saccades: Within functional limits Convergence: Within functional limits Visual Fields: No apparent deficits Depth Perception: Undershoots     Perception     Praxis      Pertinent Vitals/Pain Pain Assessment: 0-10 Pain Score: 6  Pain Location:  right side of head Pain Descriptors / Indicators: (worse when he closes his eyes) Pain Intervention(s): Monitored during session(Pt not requesting any pain meds  )     Hand Dominance Left   Extremity/Trunk Assessment Upper Extremity Assessment Upper Extremity Assessment: LUE deficits/detail LUE Deficits / Details: grossly 4/5 MMT, functional use but noted dysmetria  LUE Sensation: WNL LUE Coordination: decreased gross motor   Lower Extremity Assessment Lower Extremity Assessment: Defer to PT evaluation   Cervical / Trunk Assessment Cervical / Trunk Assessment: Normal   Communication Communication Communication: No difficulties   Cognition Arousal/Alertness: Awake/alert Behavior During Therapy: WFL for tasks assessed/performed Overall Cognitive Status: Impaired/Different from baseline Area of Impairment: Problem solving                             Problem Solving: Slow processing;Requires verbal cues General Comments: pt requires increased time for sequencing and problem sovling, reports word finding challenges    General Comments  watch BP, elevated to 175/134 after mobility; RN notifed and aware     Exercises Exercises: (encouraged patient to utilize L UE for ADL activities )   Shoulder Instructions      Home Living Family/patient expects to be discharged to:: Private residence Living Arrangements: Spouse/significant other(fiancee) Available Help at Discharge: Available 24 hours/day(fiancee) Type of Home: House Home Access: Level entry     Home Layout: One level     Bathroom Shower/Tub: Chief Strategy Officer: Standard     Home Equipment: Production assistant, radio - single point          Prior Functioning/Environment Level of Independence: Independent        Comments: driving and working Physiological scientist)         OT Problem List: Decreased strength;Decreased activity tolerance;Impaired balance (sitting and/or standing);Impaired  vision/perception;Decreased coordination;Decreased safety awareness;Decreased knowledge of use of DME or AE;Decreased knowledge of precautions;Impaired UE functional use      OT Treatment/Interventions: Self-care/ADL training;Therapeutic exercise;Neuromuscular education;Energy conservation;DME and/or AE instruction;Therapeutic activities;Visual/perceptual remediation/compensation;Patient/family education;Balance training;Cognitive remediation/compensation    OT Goals(Current goals can be found in the care plan section) Acute Rehab OT Goals Patient Stated Goal: to get home  OT Goal Formulation: With patient Time For Goal Achievement: 09/21/18 Potential to Achieve Goals: Good  OT Frequency: Min 2X/week   Barriers to D/C:            Co-evaluation PT/OT/SLP Co-Evaluation/Treatment: Yes Reason for Co-Treatment: For patient/therapist safety   OT goals addressed during session: ADL's and self-care;Other (comment)(transfers/mobility)      AM-PAC PT "6 Clicks" Daily Activity     Outcome Measure Help from another person eating meals?: None Help from another person taking care of personal grooming?: A Little Help from another person toileting, which includes using toliet, bedpan, or urinal?: A Little Help from another person bathing (including washing, rinsing, drying)?: A Little Help from another person to put on and taking off regular upper body clothing?: A Little Help from another person to put on and taking off regular lower body clothing?: A Little 6 Click Score: 19   End of Session Equipment Utilized During Treatment: Gait belt;Rolling walker Nurse Communication: Mobility status;Precautions;Other (comment)(BP)  Activity Tolerance: Patient tolerated treatment well Patient left: in chair;with call bell/phone within reach;with chair alarm set  OT Visit Diagnosis: Other abnormalities of gait and mobility (R26.89);Muscle weakness (generalized) (M62.81);Other symptoms and signs  involving the nervous system (R29.898);Hemiplegia and hemiparesis Hemiplegia - Right/Left: Left  Hemiplegia - dominant/non-dominant: Dominant Hemiplegia - caused by: Cerebral infarction                Time: 8144-8185 OT Time Calculation (min): 34 min Charges:  OT General Charges $OT Visit: 1 Visit OT Evaluation $OT Eval Moderate Complexity: 1 Mod  Chancy Milroy, OT Acute Rehabilitation Services Pager [phone] Office [phone]   Chancy Milroy 09/07/2018, 2:44 PM

## 2018-09-07 NOTE — Evaluation (Signed)
Speech Language Pathology Evaluation Patient Details Name: [facility] MRN: [id number] DOB: 30-Jul-1971 Today's Date: 09/07/2018 Time: 5809-9833 SLP Time Calculation (min) (ACUTE ONLY): 22 min  Problem List:  Patient Active Problem List   Diagnosis Date Noted  . Cerebellar stroke (HCC) 09/06/2018  . Tobacco use 09/06/2018  . Non-ST elevation (NSTEMI) myocardial infarction (HCC) 05/05/2018  . NSTEMI (non-ST elevated myocardial infarction) (HCC) 05/05/2018  . Severe Vitamin D deficiency 04/02/2018  . Community acquired pneumonia of left lower lobe of lung (HCC) 04/02/2018  . CKD (chronic kidney disease), stage III (HCC) = Secondary to Hypertension 04/02/2018  . Pneumonia 03/30/2018  . Metabolic acidosis 03/30/2018  . AKI (acute kidney injury) (HCC) 03/30/2018  . N&V (nausea and vomiting) 03/30/2018  . Essential hypertension 03/30/2018   Past Medical History:  Past Medical History:  Diagnosis Date  . CAD (coronary artery disease)    a. s/p NSTEMI in 04/2018 with DES to LCx.   [name] Hypertension   . Myocardial infarction (HCC)   . Pneumonia    Past Surgical History:  Past Surgical History:  Procedure Laterality Date  . CORONARY STENT INTERVENTION N/A 05/05/2018   Procedure: CORONARY STENT INTERVENTION;  Surgeon: Marykay Lex, MD;  Location: [facility] INVASIVE CV LAB;  Service: Cardiovascular;  Laterality: N/A;  . LEFT HEART CATH AND CORONARY ANGIOGRAPHY N/A 05/05/2018   Procedure: LEFT HEART CATH AND CORONARY ANGIOGRAPHY;  Surgeon: Marykay Lex, MD;  Location: [facility] INVASIVE CV LAB;  Service: Cardiovascular;  Laterality: N/A;   HPI:  Zachary Mccann is a 47 y.o. male with medical history significant of CAD/MI s/p NSTEMI w/ DES placed on 04/2018, HTN, tobacco use.  After acute onset neurological deficit symptoms at approximately 17:30 on 09/05/2018. Initial sx of  Difficulty getting out of his car. Symptoms were described as left sided weakness and feeling off balance. Patient endorses  several similar episodes over the course of the night when he has to get up to urinate. At times felt better buthas consistently had symptoms of a foggy headed feeling and worsening headache. Currently symptoms are constant and getting worse.family members were with patient also endorses slurred speech intermittently. Denies any loss of consciousness, blurred vision.  Of note patient had an and STEMI on 05/05/2018 and underwent cardia catheterization and placement of DES. Patient endorses intermittent compliancewith his medical regimen since that time including stopping his Derinda Late to on his own secondary to erectile dysfunction symptoms.  MRI is showing left SCA distribution acute/early subacute infarction is stable in distribution. No associated hemorrhage or mass effect as well as small chronic infarction involving the right lentiform nucleus extending into the corona radiata and very small chronic infarcts of   Assessment / Plan / Recommendation Clinical Impression  Cognitive/linguistic and motor speech screen was completed..  Oral mechanism exam was completed and unremarkable.  Lingual/labial and facial range of motion and strength appeared to be adequate.  In addition, sensation appeared to be intact.  Speech was clear and easy to understand.  However, the patient did complain of issues saying certain words.  He felt that at times there were words that were slightly slurred.  Cognitive/linguistic skills appeared to be funcitonal.  He achieved a score of 29/30 on the Mini Mental State Exam.  He was oriented to person, place, time and situation.  He had great immediate recall and his attention to task was good.  He was able to name objects, repeat a short sentences, follow a 3 step command, read/comprehend a  short sentence, write a sentence (ie although legibility was fair due to motoric issues) and copy a design.  Mild issues noted with delayed recall. He was only able to recall 2/3 independently and  required a choice of two to name third novel word.  He was able to partially complete a clock drawing task.  He was able to draw the clock face and place all the numbers.  He set the time to 10:10 instead of the requested time of 11:10.  He was aware of his deficits and that he will require therapy to address.  Given results of this assessment acute ST needs are not identified. He was encouraged to follow up with his primary care MD if he continued to feel that his speech was slurred and desired therapy to address.      SLP Assessment  SLP Recommendation/Assessment: Patient does not need any further Speech Lanaguage Pathology Services SLP Visit Diagnosis: Cognitive communication deficit (R41.841)    Follow Up Recommendations  None          SLP Evaluation Cognition  Overall Cognitive Status: Within Functional Limits for tasks assessed Arousal/Alertness: Awake/alert Orientation Level: Oriented X4 Attention: Sustained Sustained Attention: Appears intact Memory: Appears intact Awareness: Appears intact Safety/Judgment: Appears intact       Comprehension  Auditory Comprehension Overall Auditory Comprehension: Appears within functional limits for tasks assessed Commands: Within Functional Limits Conversation: Simple Reading Comprehension Reading Status: Within funtional limits    Expression Expression Primary Mode of Expression: Verbal Verbal Expression Overall Verbal Expression: Appears within functional limits for tasks assessed Initiation: No impairment Automatic Speech: Name;Social Response Level of Generative/Spontaneous Verbalization: Conversation Repetition: No impairment Naming: No impairment Pragmatics: No impairment Non-Verbal Means of Communication: Not applicable Written Expression Dominant Hand: Left Written Expression: Within Functional Limits   Oral / Motor  Oral Motor/Sensory Function Overall Oral Motor/Sensory Function: Within functional limits Motor  Speech Overall Motor Speech: Appears within functional limits for tasks assessed Respiration: Within functional limits Phonation: Normal Resonance: Within functional limits Articulation: Within functional limitis Intelligibility: Intelligible Motor Planning: Witnin functional limits Motor Speech Errors: Not applicable   GO                   Dimas Aguas, MA, CCC-SLP Acute Rehab SLP [phone] [place] 09/07/2018, 2:30 PM

## 2018-09-07 NOTE — H&P (View-Only) (Signed)
PROGRESS NOTE  Z[facility]  JYN:[id number] DOB: 10/05/1971 DOA: 09/06/2018 PCP: Patient, No Pcp Per   Brief Narrative: [facility]ary Mccann is a 47 y.o. male with a history of CAD s/p NSTEMI with DES to LCx May 2019, HTN, tobacco use, and medication nonadherence who presented to [facility] on 9/21 with dizziness, gait instability, and diffuse L > R weakness that began the previous evening. He was found to have an acute-subacute left cerebellar SCE territory infarct with mild mass effect in addition to old right basal ganglia lacunar infarct. He was given labetalol for hypertensive emergency and transferred to [facility]   Assessment & Plan: Active Problems:   Essential hypertension   CKD (chronic kidney disease), stage III (HCC) = Secondary to Hypertension   Non-ST elevation (NSTEMI) myocardial infarction (HCC)   Cerebellar stroke (HCC)   Tobacco use  Left cerebellar SCA territory CVA, acute-subacute: With persistent dysmetria, dizziness, gait instability.  - Not currently on antiplatelet due to allergy to aspirin, intolerance to brilinta, and contraindication to effient. Defer to neurology.  - Further neuroimaging per neurology - Neurology planning TEE 9/23. - HbA1c (previous was 5.5%), lipid panel (last LDL was 56). Continue high dose statin.  - PT/OT/SLP evaluation. Up w/assistance.  CAD s/p NSTEMI with DES to Mid-distal LCx 100% stenosis May 2019: No chest pain, dyspnea.  - Has had intolerance to antiplatelets and beta blockers.  - Continue statin  ICM, chronic HFrEF: EF 35-45% on LHC angiography in May.  - Monitor I/O, daily weights, DC IVF's  HTN with hypertensive emergency: Given some labetalol in ED.  - Continue permissive HTN pending neurology evaluation. Hydralazine prn to maintain BP < 220/120. - Holding home bidil and norvasc  Stage II CKD: Cr at baseline with CrCl 55ml/min.  - Monitor  Tobacco use:  - Cessation counseling provided - Nicotine patch  DVT prophylaxis:  Heparin Code Status: Full Family Communication: None at bedside Disposition Plan: Therapy evaluations pending  Consultants:   Neurology  Procedures:   None  Antimicrobials:  None   Subjective: Feels well, didn't sleep well. Hungry. Denies trouble speaking or swallowing, still feels very dizzy and clumsy with the left arm/hand.   Objective: Vitals:   09/07/18 0200 09/07/18 0400 09/07/18 0844 09/07/18 1202  BP: (!) 183/110 (!) 161/95 (!) 188/115 (!) 173/114  Pulse: 80 83 92 88  Resp: 16 18 20 18   Temp: 98.7 F (37.1 C) 98.3 F (36.8 C)  98.5 F (36.9 C)  TempSrc: Oral Oral  Oral  SpO2: 100% 98%  98%  Weight:      Height:        Intake/Output Summary (Last 24 hours) at 09/07/2018 1300 Last data filed at 09/07/2018 0413 Gross per 24 hour  Intake 947.8 ml  Output 1100 ml  Net -152.2 ml   Filed Weights   09/06/18 1452  Weight: 127.5 kg    Gen: 47 y.o. male in no distress Pulm: Non-labored breathing. Clear to auscultation bilaterally.  CV: Regular rate and rhythm. No murmur, rub, or gallop. No JVD, no pedal edema. GI: Abdomen soft, non-tender, non-distended, with normoactive bowel sounds. No organomegaly or masses felt. Ext: Warm, no deformities Skin: No rashes, lesions or ulcers Neuro: Alert and oriented. CNs intact grossly with no nystagmus, smooth pursuit EOMI. Left arm/hand strength 5/5 but significant dysmetria on FTN, mild clumsiness with heel to shin, though flexes hip off bed against resistance and no sensory deficits. Psych: Judgement and insight appear normal. Mood & affect appropriate.  Data Reviewed: I have personally reviewed following labs and imaging studies  CBC: Recent Labs  Lab 09/06/18 1511 09/06/18 1526  WBC 6.5  --   NEUTROABS 4.4  --   HGB 14.4 15.0  HCT 42.2 44.0  MCV 94.2  --   PLT 213  --    Basic Metabolic Panel: Recent Labs  Lab 09/06/18 1511 09/06/18 1526  NA 140 141  K 3.6 3.5  CL 107 107  CO2 24  --   GLUCOSE 105*  101*  BUN 19 19  CREATININE 1.78* 1.90*  CALCIUM 9.3  --    GFR: Estimated Creatinine Clearance: 66.4 mL/min (A) (by C-G formula based on SCr of 1.9 mg/dL (H)). Liver Function Tests: Recent Labs  Lab 09/06/18 1511  AST 23  ALT 32  ALKPHOS 118  BILITOT 1.1  PROT 8.2*  ALBUMIN 4.0   No results for input(s): LIPASE, AMYLASE in the last 168 hours. No results for input(s): AMMONIA in the last 168 hours. Coagulation Profile: Recent Labs  Lab 09/06/18 1511  INR 1.05   Cardiac Enzymes: No results for input(s): CKTOTAL, CKMB, CKMBINDEX, TROPONINI in the last 168 hours. BNP (last 3 results) No results for input(s): PROBNP in the last 8760 hours. HbA1C: No results for input(s): HGBA1C in the last 72 hours. CBG: Recent Labs  Lab 09/06/18 1528  GLUCAP 107*   Lipid Profile: No results for input(s): CHOL, HDL, LDLCALC, TRIG, CHOLHDL, LDLDIRECT in the last 72 hours. Thyroid Function Tests: No results for input(s): TSH, T4TOTAL, FREET4, T3FREE, THYROIDAB in the last 72 hours. Anemia Panel: No results for input(s): VITAMINB12, FOLATE, FERRITIN, TIBC, IRON, RETICCTPCT in the last 72 hours. Urine analysis:    Component Value Date/Time   COLORURINE STRAW (A) 09/06/2018 2222   APPEARANCEUR CLEAR 09/06/2018 2222   LABSPEC 1.008 09/06/2018 2222   PHURINE 6.0 09/06/2018 2222   GLUCOSEU NEGATIVE 09/06/2018 2222   HGBUR SMALL (A) 09/06/2018 2222   BILIRUBINUR NEGATIVE 09/06/2018 2222   KETONESUR NEGATIVE 09/06/2018 2222   PROTEINUR NEGATIVE 09/06/2018 2222   NITRITE NEGATIVE 09/06/2018 2222   LEUKOCYTESUR NEGATIVE 09/06/2018 2222   No results found for this or any previous visit (from the past 240 hour(s)).    Radiology Studies: Mr Brain 92 Contrast  Result Date: 09/07/2018 CLINICAL DATA:  47 y/o M; left-sided weakness. Stroke for follow-up. EXAM: MRI HEAD WITHOUT CONTRAST MRA HEAD WITHOUT CONTRAST TECHNIQUE: Multiplanar, multiecho pulse sequences of the brain and surrounding  structures were obtained without intravenous contrast. Angiographic images of the head were obtained using MRA technique without contrast. COMPARISON:  None. FINDINGS: MRI HEAD FINDINGS Brain: Left SCA distribution reduced diffusion compatible with acute/early subacute infarction. Small chronic infarction involving the right lentiform nucleus extending into the corona radiata. Very small chronic infarctions of the left caudate head and left pons. No associated hemorrhage or mass effect. Several nonspecific T2 FLAIR hyperintensities in subcortical and periventricular white matter are compatible with moderate chronic microvascular ischemic changes for age. Mild volume loss of the brain. Vascular: As below. Skull and upper cervical spine: Normal marrow signal. Sinuses/Orbits: Mild ethmoid sinus mucosal thickening and partial opacification of the left maxillary sinus. No abnormal signal of the mastoid air cells. Orbits are unremarkable. Other: None. MRA HEAD FINDINGS Internal carotid arteries:  Patent. Anterior cerebral arteries:  Patent. Middle cerebral arteries: Patent. Posterior cerebral arteries:  Patent. Basilar artery:  Patent. Vertebral arteries:  Patent. Extensive motion artifact, suboptimal assessment for stenosis or small aneurysm. IMPRESSION: MRI head: 1.  Left SCA distribution acute/early subacute infarction is stable in distribution. No associated hemorrhage or mass effect. 2. Small chronic infarction involving the right lentiform nucleus extending into the corona radiata. Very small chronic infarcts of the left caudate head and left pons. 3. Moderate for age chronic microvascular ischemic changes and mild volume loss of the brain. MRA head: 1. Extensive motion artifact, suboptimal assessment for stenosis or small aneurysm. 2. No large vessel intracranial occlusion identified. Electronically Signed   By: Mitzi Hansen M.D.   On: 09/07/2018 00:09   Mr Maxine Glenn Head Wo Contrast  Result Date:  09/07/2018 CLINICAL DATA:  47 y/o M; left-sided weakness. Stroke for follow-up. EXAM: MRI HEAD WITHOUT CONTRAST MRA HEAD WITHOUT CONTRAST TECHNIQUE: Multiplanar, multiecho pulse sequences of the brain and surrounding structures were obtained without intravenous contrast. Angiographic images of the head were obtained using MRA technique without contrast. COMPARISON:  None. FINDINGS: MRI HEAD FINDINGS Brain: Left SCA distribution reduced diffusion compatible with acute/early subacute infarction. Small chronic infarction involving the right lentiform nucleus extending into the corona radiata. Very small chronic infarctions of the left caudate head and left pons. No associated hemorrhage or mass effect. Several nonspecific T2 FLAIR hyperintensities in subcortical and periventricular white matter are compatible with moderate chronic microvascular ischemic changes for age. Mild volume loss of the brain. Vascular: As below. Skull and upper cervical spine: Normal marrow signal. Sinuses/Orbits: Mild ethmoid sinus mucosal thickening and partial opacification of the left maxillary sinus. No abnormal signal of the mastoid air cells. Orbits are unremarkable. Other: None. MRA HEAD FINDINGS Internal carotid arteries:  Patent. Anterior cerebral arteries:  Patent. Middle cerebral arteries: Patent. Posterior cerebral arteries:  Patent. Basilar artery:  Patent. Vertebral arteries:  Patent. Extensive motion artifact, suboptimal assessment for stenosis or small aneurysm. IMPRESSION: MRI head: 1. Left SCA distribution acute/early subacute infarction is stable in distribution. No associated hemorrhage or mass effect. 2. Small chronic infarction involving the right lentiform nucleus extending into the corona radiata. Very small chronic infarcts of the left caudate head and left pons. 3. Moderate for age chronic microvascular ischemic changes and mild volume loss of the brain. MRA head: 1. Extensive motion artifact, suboptimal assessment  for stenosis or small aneurysm. 2. No large vessel intracranial occlusion identified. Electronically Signed   By: Mitzi Hansen M.D.   On: 09/07/2018 00:09   Ct Head Code Stroke Wo Contrast  Result Date: 09/06/2018 CLINICAL DATA:  Code stroke. 47 year old male with left side weakness since 1730 hours yesterday. EXAM: CT HEAD WITHOUT CONTRAST TECHNIQUE: Contiguous axial images were obtained from the base of the skull through the vertex without intravenous contrast. COMPARISON:  None. FINDINGS: Brain: Partially empty sella. Curvilinear hypodensity in the right basal ganglia best demonstrated on coronal image 34 most resembles a chronic lacunar infarct. Minimal cerebral white matter hypodensity elsewhere. Cerebral cortical gray matter appears normal throughout. However, there is a 2 centimeter wedge-shaped area of hypodensity in the left superior cerebellum (series 2, image 11 and sagittal image 39). Mild regional mass effect but no hemorrhagic transformation. Basilar cisterns in the 4th ventricle remain patent. Other posterior fossa gray-white matter differentiation is normal. No midline shift, ventriculomegaly, mass effect, evidence of mass lesion, intracranial hemorrhage. Vascular: No suspicious intracranial vascular hyperdensity. Skull: Negative. Sinuses/Orbits: Left maxillary mucoperiosteal thickening. Other visualized paranasal sinuses and mastoids are well pneumatized. Other: Visualized orbits and scalp soft tissues are within normal limits. ASPECTS [facility] Stroke Program Early CT Score) Total score (0-10 with 10 being normal): 10, although there  is cytotoxic edema in the left superior cerebellar artery territory. IMPRESSION: 1. Acute to subacute left cerebellar, SCA territory infarct. Mild mass effect, no associated hemorrhage. 2. Chronic appearing right basal ganglia lacunar infarct. No other No acute intracranial abnormality. 3. ASPECTS is 10, although there is cytotoxic edema in the left  superior cerebellar artery territory. 4. Study discussed by telephone with Dr. Eber Hong on 09/06/2018 at 15:11 . Electronically Signed   By: Odessa Fleming M.D.   On: 09/06/2018 15:12    Scheduled Meds: . atorvastatin  80 mg Oral q1800  . heparin  5,000 Units Subcutaneous Q8H  . nicotine  14 mg Transdermal Daily   Continuous Infusions: . sodium chloride 75 mL/hr at 09/07/18 1004     LOS: 1 day   Time spent: 25 minutes.  Tyrone Nine, MD [facility] [email or web address] Password [email or web address] 09/07/2018, 1:00 PM

## 2018-09-07 NOTE — Progress Notes (Signed)
OT Cancellation Note  Patient Details Name: [facility] MRN: [id number] DOB: [birth year]   Cancelled Treatment:    Reason Eval/Treat Not Completed: Active bedrest order. Will follow and initiate OT evaluation once bedrest is lifted.   Chancy Milroy, OT Acute Rehabilitation Services Pager [phone] Office [phone]  Chancy Milroy 09/07/2018, 10:44 AM

## 2018-09-07 NOTE — Consult Note (Addendum)
NEURO HOSPITALIST  CONSULT     Requesting Physician: Dr. Jarvis Newcomer    Chief Complaint: gait instability/ weakness  History obtained from:  Patient    HPI:                                                                                                                                         Zachary Mccann is an 47 y.o. male  With PMH HTN, NSTEMI w/ DES( placed on 5/19) CAD presented to [facility] ED with a 1 day history of left side weakness and unsteady gait. Patient was transferred to [facility] and neurology consulted for cerebellar stroke seen on MRI.   Patient stated that on 9/20 he was at work about 1330 and had a sudden onset of not feeling right and that he could not control his left leg. He stopped took a pause and proceeded to go to the smoking area for break. When break was over ( after he smoked) he still did not feel right so he let everyone else go back in, and then he proceeded back in. ( he states he didn't want his co - workers to see him fall). At the end of the day about 1500 he still was not feeling right, but at this point he noticed the room began to spin and he had some "fuzzy vision". Then about 1730 while going to a restaurant and when he tried to get out of the car about 51700 he was unable to walk because of left-sided weakness. He felt unbalanced, and since that time has been unable to walk without holding onto things. 09/06/18 he woke up about 2 am and his fiancee noticed some slurred speech, and he proceeded to hold onto walls for assistance with ambulation to and from the bathroom. He thought he was just tired from working a lot of overtime at work, plus he felt better after sleeping. So he went back to bed. When he woke up later that morning and he still had weakness and numbness in his arm and leg he got dressed and went to [employer]. Denies any diplopia, CP, SOB, falls, LOC, head trauma. He did endorse that at home he never  fell, but he did lose his balance and hit his head against that wall. He also endorses drinking 1 pint of liquor every Saturday, marijuana usage, not taking brilinta 3 times per day as prescribed, and he has also been with out anti-hypertensive medication since wednesday 09-03-18 ( has not had a chance to pick-up the new prescription). Does not take a daily ASA. Does not have a PCP. He smokes about  1 pack of cigarettes per week since his MI, but has a > 10 year smoking history.   Hospital course:  CT Head, MRI and MRA performed BP: 188/115 BG:107 UDS: + for THC   No prior stroke history noted.    Date last known well: Date: 09/05/2018 Time last known well: Time: 13:30 tPA Given: No: outside of window  Modified Rankin: Rankin Score=0  NIHSS:4 1a Level of Conscious:0 1b LOC Questions: 0 1c LOC Commands: 0 2 Best Gaze: 0 3 Visual: 0 4 Facial Palsy: 1 5a Motor Arm - left: 1 5b Motor Arm - Right: 0 6a Motor Leg - Left: 0 6b Motor Leg - Right:0 7 Limb Ataxia: 0 8 Sensory: 1 9 Best Language: 0 10 Dysarthria:1 11 Extinct. and Inattention:0 TOTAL: 4      Past Medical History:  Diagnosis Date  . CAD (coronary artery disease)    a. s/p NSTEMI in 04/2018 with DES to LCx.   [name] Hypertension   . Myocardial infarction (HCC)   . Pneumonia     Past Surgical History:  Procedure Laterality Date  . CORONARY STENT INTERVENTION N/A 05/05/2018   Procedure: CORONARY STENT INTERVENTION;  Surgeon: Marykay Lex, MD;  Location: [facility] INVASIVE CV LAB;  Service: Cardiovascular;  Laterality: N/A;  . LEFT HEART CATH AND CORONARY ANGIOGRAPHY N/A 05/05/2018   Procedure: LEFT HEART CATH AND CORONARY ANGIOGRAPHY;  Surgeon: Marykay Lex, MD;  Location: [facility] INVASIVE CV LAB;  Service: Cardiovascular;  Laterality: N/A;    Family History  Problem Relation Age of Onset  . Hypertension Mother   . Cancer Mother   . Hypertension Father          Social History:  reports that he has been smoking  cigarettes. He has been smoking about 0.50 packs per day. He has never used smokeless tobacco. He reports that he drinks alcohol. He reports that he has current or past drug history. Drug: Marijuana.  Allergies:  Allergies  Allergen Reactions  . Aspirin     Swelling     Medications:                                                                                                                           Scheduled: . atorvastatin  80 mg Oral q1800  . heparin  5,000 Units Subcutaneous Q8H  . nicotine  14 mg Transdermal Daily   Continuous: . sodium chloride 75 mL/hr at 09/07/18 1004   ZOX:WRUEAVWUJWJ, HYDROcodone-acetaminophen, LORazepam, ondansetron **OR** ondansetron (ZOFRAN) IV   ROS:  ROS was performed and is negative except as noted in HPI    General Examination:                                                                                                      Blood pressure (!) 188/115, pulse 92, temperature 98.3 F (36.8 C), temperature source Oral, resp. rate 20, height 6' (1.829 m), weight 127.5 kg, SpO2 98 %.  HEENT-  Normocephalic, no lesions, without obvious abnormality.  Normal external eye and conjunctiva.  Cardiovascular- S1-S2 audible, pulses palpable throughout   Lungs-no rhonchi or wheezing noted, no excessive working breathing.  Saturations within normal limits on RA Abdomen- BS present x 4 quadrants Extremities- Warm, dry and intact Musculoskeletal-no joint tenderness, deformity or swelling Skin-warm and dry, no hyperpigmentation, vitiligo, or suspicious lesions  Neurological Examination Mental Status: Alert, oriented,person/place/month/city/year/president thought content appropriate.  Speech fluent without evidence of aphasia. Slight dysarthria noted. Able to follow  commands without difficulty. Cranial Nerves: II:  Visual  fields grossly normal,  III,IV, VI: ptosis not present, extra-ocular motions intact bilaterally, pupils equal, round, reactive to light  V,VII: smile asymmetric, left facial droop noted, facial light touch sensation< left side VIII: hearing normal bilaterally IX,X: uvula rises symmetrically XI: bilateral shoulder shrug XII: midline tongue extension Motor: Right : Upper extremity   5/5  Left:     Upper extremity   5/5  Lower extremity   5/5   Lower extremity   4/5 Tone and bulk:normal tone throughout; no atrophy noted Sensory: Pinprick and light touch intact throughout, bilaterally Deep Tendon Reflexes: 2+ and symmetric biceps and patella Plantars: Right: downgoing   Left: downgoing Cerebellar: normal finger-to-nose right side, dysmetria noted on left FNF left rapid alternating movements is slower than right,  and normal heel-to-shin test Gait: deferred   Lab Results: Basic Metabolic Panel: Recent Labs  Lab 09/06/18 1511 09/06/18 1526  NA 140 141  K 3.6 3.5  CL 107 107  CO2 24  --   GLUCOSE 105* 101*  BUN 19 19  CREATININE 1.78* 1.90*  CALCIUM 9.3  --     CBC: Recent Labs  Lab 09/06/18 1511 09/06/18 1526  WBC 6.5  --   NEUTROABS 4.4  --   HGB 14.4 15.0  HCT 42.2 44.0  MCV 94.2  --   PLT 213  --     Lipid Panel: No results for input(s): CHOL, TRIG, HDL, CHOLHDL, VLDL, LDLCALC in the last 168 hours.  CBG: Recent Labs  Lab 09/06/18 1528  GLUCAP 107*    Imaging: [name]  Result Date: 09/07/2018 CLINICAL DATA:  47 y/o M; left-sided weakness. Stroke for follow-up. EXAM: MRI HEAD WITHOUT CONTRAST MRA HEAD WITHOUT CONTRAST TECHNIQUE: Multiplanar, multiecho pulse sequences of the brain and surrounding structures were obtained without intravenous contrast. Angiographic images of the head were obtained using MRA technique without contrast. COMPARISON:  None. FINDINGS: MRI HEAD FINDINGS Brain: Left SCA distribution reduced diffusion compatible with  acute/early subacute infarction. Small chronic infarction involving the right lentiform nucleus extending into the corona radiata. Very  small chronic infarctions of the left caudate head and left pons. No associated hemorrhage or mass effect. Several nonspecific T2 FLAIR hyperintensities in subcortical and periventricular white matter are compatible with moderate chronic microvascular ischemic changes for age. Mild volume loss of the brain. Vascular: As below. Skull and upper cervical spine: Normal marrow signal. Sinuses/Orbits: Mild ethmoid sinus mucosal thickening and partial opacification of the left maxillary sinus. No abnormal signal of the mastoid air cells. Orbits are unremarkable. Other: None. MRA HEAD FINDINGS Internal carotid arteries:  Patent. Anterior cerebral arteries:  Patent. Middle cerebral arteries: Patent. Posterior cerebral arteries:  Patent. Basilar artery:  Patent. Vertebral arteries:  Patent. Extensive motion artifact, suboptimal assessment for stenosis or small aneurysm. IMPRESSION: MRI head: 1. Left SCA distribution acute/early subacute infarction is stable in distribution. No associated hemorrhage or mass effect. 2. Small chronic infarction involving the right lentiform nucleus extending into the corona radiata. Very small chronic infarcts of the left caudate head and left pons. 3. Moderate for age chronic microvascular ischemic changes and mild volume loss of the brain. MRA head: 1. Extensive motion artifact, suboptimal assessment for stenosis or small aneurysm. 2. No large vessel intracranial occlusion identified. Electronically Signed   By: Mitzi Hansen M.D.   On: 09/07/2018 00:09   Mr Maxine Glenn Head Wo Contrast  Result Date: 09/07/2018 CLINICAL DATA:  47 y/o M; left-sided weakness. Stroke for follow-up. EXAM: MRI HEAD WITHOUT CONTRAST MRA HEAD WITHOUT CONTRAST TECHNIQUE: Multiplanar, multiecho pulse sequences of the brain and surrounding structures were obtained without  intravenous contrast. Angiographic images of the head were obtained using MRA technique without contrast. COMPARISON:  None. FINDINGS: MRI HEAD FINDINGS Brain: Left SCA distribution reduced diffusion compatible with acute/early subacute infarction. Small chronic infarction involving the right lentiform nucleus extending into the corona radiata. Very small chronic infarctions of the left caudate head and left pons. No associated hemorrhage or mass effect. Several nonspecific T2 FLAIR hyperintensities in subcortical and periventricular white matter are compatible with moderate chronic microvascular ischemic changes for age. Mild volume loss of the brain. Vascular: As below. Skull and upper cervical spine: Normal marrow signal. Sinuses/Orbits: Mild ethmoid sinus mucosal thickening and partial opacification of the left maxillary sinus. No abnormal signal of the mastoid air cells. Orbits are unremarkable. Other: None. MRA HEAD FINDINGS Internal carotid arteries:  Patent. Anterior cerebral arteries:  Patent. Middle cerebral arteries: Patent. Posterior cerebral arteries:  Patent. Basilar artery:  Patent. Vertebral arteries:  Patent. Extensive motion artifact, suboptimal assessment for stenosis or small aneurysm. IMPRESSION: MRI head: 1. Left SCA distribution acute/early subacute infarction is stable in distribution. No associated hemorrhage or mass effect. 2. Small chronic infarction involving the right lentiform nucleus extending into the corona radiata. Very small chronic infarcts of the left caudate head and left pons. 3. Moderate for age chronic microvascular ischemic changes and mild volume loss of the brain. MRA head: 1. Extensive motion artifact, suboptimal assessment for stenosis or small aneurysm. 2. No large vessel intracranial occlusion identified. Electronically Signed   By: Mitzi Hansen M.D.   On: 09/07/2018 00:09   Ct Head Code Stroke Wo Contrast  Result Date: 09/06/2018 CLINICAL DATA:  Code  stroke. 47 year old male with left side weakness since 1730 hours yesterday. EXAM: CT HEAD WITHOUT CONTRAST TECHNIQUE: Contiguous axial images were obtained from the base of the skull through the vertex without intravenous contrast. COMPARISON:  None. FINDINGS: Brain: Partially empty sella. Curvilinear hypodensity in the right basal ganglia best demonstrated on coronal image 34 most resembles  a chronic lacunar infarct. Minimal cerebral white matter hypodensity elsewhere. Cerebral cortical gray matter appears normal throughout. However, there is a 2 centimeter wedge-shaped area of hypodensity in the left superior cerebellum (series 2, image 11 and sagittal image 39). Mild regional mass effect but no hemorrhagic transformation. Basilar cisterns in the 4th ventricle remain patent. Other posterior fossa gray-white matter differentiation is normal. No midline shift, ventriculomegaly, mass effect, evidence of mass lesion, intracranial hemorrhage. Vascular: No suspicious intracranial vascular hyperdensity. Skull: Negative. Sinuses/Orbits: Left maxillary mucoperiosteal thickening. Other visualized paranasal sinuses and mastoids are well pneumatized. Other: Visualized orbits and scalp soft tissues are within normal limits. ASPECTS [facility] Stroke Program Early CT Score) Total score (0-10 with 10 being normal): 10, although there is cytotoxic edema in the left superior cerebellar artery territory. IMPRESSION: 1. Acute to subacute left cerebellar, SCA territory infarct. Mild mass effect, no associated hemorrhage. 2. Chronic appearing right basal ganglia lacunar infarct. No other No acute intracranial abnormality. 3. ASPECTS is 10, although there is cytotoxic edema in the left superior cerebellar artery territory. 4. Study discussed by telephone with Dr. Eber Hong on 09/06/2018 at 15:11 . Electronically Signed   By: Odessa Fleming M.D.   On: 09/06/2018 15:12    Valentina Lucks, MSN, NP-C [facility] [phone]   09/07/2018, 9:46 AM   Attending physician note to follow with Assessment and plan .   Assessment: 47 y.o. male With PMH HTN, NSTEMI w/ DES( placed on 5/19) CAD presented to [facility] ED with a 1 day history of left side weakness and unsteady gait. Patient was transferred to [facility] and neurology consulted for L SCA infarction seen on MRI.   Impression: Stroke Risk Factors - hypertension # cerebellar stroke- further stroke work-up needed   Recommendations: -- BP goal : Permissive HTN upto 220/110 mmHg   --MRI Brain  --MRA of the head w/o and neck with contrast  --Echocardiogram -- Plavix, patient allergic to ASA  --smoking cessation -- High intensity Statin if LDL > 70 -- HgbA1c, fasting lipid panel -- PT consult, OT consult, Speech consult --Telemetry monitoring --Frequent neuro checks --Stroke swallow screen   --please page stroke NP  Or  PA  Or MD from 8am -4 pm  as this patient from this time will be  followed by the stroke.   You can look them up on [email or web address]  Password [email or web address]   NEUROHOSPITALIST ADDENDUM Performed a face to face diagnostic evaluation.   I have reviewed the contents of history and physical exam as documented by PA/ARNP/Resident and agree with above documentation.  I have discussed and formulated the above plan as documented. Edits to the note have been made as needed.  Patient transferred from [facility] to [facility] for work-up of cerebellar infarct.  No mass-effect is seen I think the patient will do fairly well.  Etiology of stroke is unclear at this point.  So far work-up has been negative including MRA and echo.  I think it is reasonable to do a TEE given his young age.  He is a vasculopath and noncompliant on medications.  Stroke team will continue to follow .    Georgiana Spinner Aroor MD [facility] [phone]   If 7pm to 7am, please call on call as listed on AMION.

## 2018-09-07 NOTE — Progress Notes (Signed)
PT Cancellation Note  Patient Details Name: Zachary Mccann MRN: [id number] DOB: Feb 03, 1971   Cancelled Treatment:    Reason Eval/Treat Not Completed: Active bedrest order   Will follow for PT evaluation once Bedrest activity order is lifted;   Van Clines, PT  Acute Rehabilitation Services Pager [phone] Office [phone]    [name] 09/07/2018, 7:54 AM

## 2018-09-08 ENCOUNTER — Encounter (HOSPITAL_COMMUNITY)
Admission: EM | Disposition: A | Discharge status: Home / Self Care | Payer: Self-pay | Source: Home / Self Care | Attending: Family Medicine

## 2018-09-08 ENCOUNTER — Inpatient Hospital Stay (HOSPITAL_COMMUNITY): Payer: BLUE CROSS/BLUE SHIELD

## 2018-09-08 ENCOUNTER — Inpatient Hospital Stay (HOSPITAL_COMMUNITY): Payer: BLUE CROSS/BLUE SHIELD | Admitting: Anesthesiology

## 2018-09-08 ENCOUNTER — Encounter (HOSPITAL_COMMUNITY): Payer: Self-pay

## 2018-09-08 DIAGNOSIS — I214 Non-ST elevation (NSTEMI) myocardial infarction: Secondary | ICD-10-CM

## 2018-09-08 DIAGNOSIS — R200 Anesthesia of skin: Secondary | ICD-10-CM

## 2018-09-08 DIAGNOSIS — I10 Essential (primary) hypertension: Secondary | ICD-10-CM

## 2018-09-08 DIAGNOSIS — R531 Weakness: Secondary | ICD-10-CM

## 2018-09-08 DIAGNOSIS — I639 Cerebral infarction, unspecified: Principal | ICD-10-CM

## 2018-09-08 DIAGNOSIS — N183 Chronic kidney disease, stage 3 (moderate): Secondary | ICD-10-CM

## 2018-09-08 DIAGNOSIS — Z720 Tobacco use: Secondary | ICD-10-CM

## 2018-09-08 DIAGNOSIS — I63342 Cerebral infarction due to thrombosis of left cerebellar artery: Secondary | ICD-10-CM

## 2018-09-08 DIAGNOSIS — I6389 Other cerebral infarction: Secondary | ICD-10-CM

## 2018-09-08 HISTORY — PX: TEE WITHOUT CARDIOVERSION: SHX5443

## 2018-09-08 LAB — BASIC METABOLIC PANEL
ANION GAP: 12 (ref 5–15)
BUN: 18 mg/dL (ref 6–20)
CALCIUM: 8.8 mg/dL — AB (ref 8.9–10.3)
CO2: 21 mmol/L — ABNORMAL LOW (ref 22–32)
Chloride: 104 mmol/L (ref 98–111)
Creatinine, Ser: 1.82 mg/dL — ABNORMAL HIGH (ref 0.61–1.24)
GFR, EST AFRICAN AMERICAN: 49 mL/min — AB (ref >60)
GFR, EST NON AFRICAN AMERICAN: 43 mL/min — AB (ref >60)
Glucose, Bld: 98 mg/dL (ref 70–99)
POTASSIUM: 3.8 mmol/L (ref 3.5–5.1)
SODIUM: 137 mmol/L (ref 135–145)

## 2018-09-08 LAB — LIPID PANEL
CHOL/HDL RATIO: 3.7 ratio
Cholesterol: 128 mg/dL (ref 0–200)
HDL: 35 mg/dL — AB (ref >40)
LDL Cholesterol: 81 mg/dL (ref 0–99)
TRIGLYCERIDES: 58 mg/dL (ref <150)
VLDL: 12 mg/dL (ref 0–40)

## 2018-09-08 LAB — CBC
HCT: 41.9 % (ref 39.0–52.0)
Hemoglobin: 13.9 g/dL (ref 13.0–17.0)
MCH: 31.4 pg (ref 26.0–34.0)
MCHC: 33.2 g/dL (ref 30.0–36.0)
MCV: 94.6 fL (ref 78.0–100.0)
PLATELETS: 187 10*3/uL (ref 150–400)
RBC: 4.43 MIL/uL (ref 4.22–5.81)
RDW: 12.9 % (ref 11.5–15.5)
WBC: 5.5 10*3/uL (ref 4.0–10.5)

## 2018-09-08 LAB — VITAMIN B12: VITAMIN B 12: 389 pg/mL (ref 180–914)

## 2018-09-08 LAB — TSH: TSH: 1.812 u[IU]/mL (ref 0.350–4.500)

## 2018-09-08 LAB — ECHOCARDIOGRAM LIMITED
HEIGHTINCHES: 72 in
Weight: 4496 oz

## 2018-09-08 LAB — HEMOGLOBIN A1C
Hgb A1c MFr Bld: 5.4 % (ref 4.8–5.6)
Mean Plasma Glucose: 108.28 mg/dL

## 2018-09-08 SURGERY — ECHOCARDIOGRAM, TRANSESOPHAGEAL
Anesthesia: Monitor Anesthesia Care

## 2018-09-08 MED ORDER — HYDRALAZINE HCL 20 MG/ML IJ SOLN
INTRAMUSCULAR | Status: AC
  Filled 2018-09-08: qty 1

## 2018-09-08 MED ORDER — AMLODIPINE BESYLATE 5 MG PO TABS
5.0000 mg | ORAL_TABLET | Freq: Every day | ORAL | Status: DC
  Administered 2018-09-08: 5 mg via ORAL
  Filled 2018-09-08: qty 1

## 2018-09-08 MED ORDER — HYDRALAZINE HCL 20 MG/ML IJ SOLN
20.0000 mg | Freq: Once | INTRAMUSCULAR | Status: AC
  Administered 2018-09-08: 20 mg via INTRAVENOUS

## 2018-09-08 MED ORDER — PROPOFOL 10 MG/ML IV BOLUS
INTRAVENOUS | Status: DC | PRN
  Administered 2018-09-08: 15 mg via INTRAVENOUS
  Administered 2018-09-08: 10 mg via INTRAVENOUS
  Administered 2018-09-08: 15 mg via INTRAVENOUS

## 2018-09-08 MED ORDER — ISOSORB DINITRATE-HYDRALAZINE 20-37.5 MG PO TABS
1.0000 | ORAL_TABLET | Freq: Three times a day (TID) | ORAL | Status: DC
  Administered 2018-09-08: 1 via ORAL
  Filled 2018-09-08: qty 1

## 2018-09-08 MED ORDER — PROPOFOL 500 MG/50ML IV EMUL
INTRAVENOUS | Status: DC | PRN
  Administered 2018-09-08: 150 ug/kg/min via INTRAVENOUS

## 2018-09-08 MED ORDER — IOPAMIDOL (ISOVUE-370) INJECTION 76%
INTRAVENOUS | Status: AC
  Filled 2018-09-08: qty 50

## 2018-09-08 MED ORDER — BUTAMBEN-TETRACAINE-BENZOCAINE 2-2-14 % EX AERO
INHALATION_SPRAY | CUTANEOUS | Status: DC | PRN
  Administered 2018-09-08: 2 via TOPICAL

## 2018-09-08 MED ORDER — CLOPIDOGREL BISULFATE 75 MG PO TABS
75.0000 mg | ORAL_TABLET | Freq: Every day | ORAL | Status: DC
  Administered 2018-09-08: 75 mg via ORAL
  Filled 2018-09-08: qty 1

## 2018-09-08 MED ORDER — CLOPIDOGREL BISULFATE 75 MG PO TABS: 75.0000 mg | ORAL_TABLET | Freq: Every day | ORAL | 0 refills | Status: DC

## 2018-09-08 MED ORDER — SODIUM CHLORIDE 0.9 % IV SOLN
INTRAVENOUS | Status: DC
  Administered 2018-09-08: 11:00:00 via INTRAVENOUS

## 2018-09-08 MED ORDER — IOPAMIDOL (ISOVUE-370) INJECTION 76%
100.0000 mL | Freq: Once | INTRAVENOUS | Status: AC | PRN
  Administered 2018-09-08: 50 mL via INTRAVENOUS

## 2018-09-08 NOTE — Anesthesia Preprocedure Evaluation (Signed)
Anesthesia Evaluation  Patient identified by MRN, date of birth, ID band Patient awake    Reviewed: Allergy & Precautions, H&P , NPO status , Patient's Chart, lab work & pertinent test results, reviewed documented beta blocker date and time   Airway Mallampati: III  TM Distance: >3 FB Neck ROM: Full    Dental no notable dental hx. (+) Teeth Intact, Dental Advisory Given   Pulmonary Current Smoker,    Pulmonary exam normal breath sounds clear to auscultation       Cardiovascular hypertension, Pt. on medications + CAD, + Past MI, + Cardiac Stents and + Peripheral Vascular Disease   Rhythm:Regular Rate:Normal     Neuro/Psych CVA, Residual Symptoms negative psych ROS   GI/Hepatic negative GI ROS, Neg liver ROS,   Endo/Other  negative endocrine ROS  Renal/GU negative Renal ROS  negative genitourinary   Musculoskeletal   Abdominal   Peds  Hematology negative hematology ROS (+)   Anesthesia Other Findings   Reproductive/Obstetrics negative OB ROS                             Anesthesia Physical Anesthesia Plan  ASA: III  Anesthesia Plan: MAC   Post-op Pain Management:    Induction: Intravenous  PONV Risk Score and Plan: 0 and Propofol infusion  Airway Management Planned: Nasal Cannula  Additional Equipment:   Intra-op Plan:   Post-operative Plan:   Informed Consent: I have reviewed the patients History and Physical, chart, labs and discussed the procedure including the risks, benefits and alternatives for the proposed anesthesia with the patient or authorized representative who has indicated his/her understanding and acceptance.   Dental advisory given  Plan Discussed with: CRNA  Anesthesia Plan Comments:         Anesthesia Quick Evaluation

## 2018-09-08 NOTE — Anesthesia Procedure Notes (Signed)
Procedure Name: MAC Date/Time: 09/08/2018 11:24 AM Performed by: White, Amedeo Plenty, CRNA Pre-anesthesia Checklist: Patient identified, Emergency Drugs available, Suction available and Patient being monitored Patient Re-evaluated:Patient Re-evaluated prior to induction Oxygen Delivery Method: Nasal cannula

## 2018-09-08 NOTE — Progress Notes (Signed)
Patient discharged in stable condition with all belongings and fiance at bedside. He verbalized understanding of all discharge instructions and the importance of follow up visits.

## 2018-09-08 NOTE — Progress Notes (Signed)
Patient admitted to endoscopy.  BP 196/143, anesthesia and cardiologist contacted and assessed patient.   Received vo from Dr. Shirlee Latch for Hydralazine 20 mg IV once may repeat x 1 if needed for elevated BP pre procedure (TEE)

## 2018-09-08 NOTE — Progress Notes (Signed)
  Echocardiogram 2D Echocardiogram has been performed.  Zachary Mccann 09/08/2018, 3:06 PM

## 2018-09-08 NOTE — Progress Notes (Signed)
Notified Dr. Jarvis Newcomer and Dr. Roda Shutters of patient BP 185/115 home medication restarted.

## 2018-09-08 NOTE — Progress Notes (Signed)
OT Cancellation Note  Patient Details Name: Zachary Mccann MRN: [id number] DOB: April 16, 1971   Cancelled Treatment:    Reason Eval/Treat Not Completed: Patient at procedure or test/ unavailable(Endo). OT will check back for treatment as schedule allows  Emelda Fear 09/08/2018, 10:19 AM   Sherryl Manges OTR/L Acute Rehabilitation Services Pager: [phone] Office: [phone]

## 2018-09-08 NOTE — CV Procedure (Signed)
Procedure: TEE  Indication: CVA  Sedation: Per anesthesiology.   Findings: Please see echo section for full report.  Normal LV size with mild LV hypertrophy.  EF 45-50%, lateral hypokinesis.  Normal RV size and systolic function. There was trivial TR and trivial MR.  Trileaflet aortic valve with no stenosis or regurgitation. Normal left atrial size with no LA appendage thrombus.  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 Normal right atrial size. Negative bubble study, no evidence for PFO/ASD. Trileaflet aortic valve with no stenosis or regurgitation.  Normal caliber aorta with minimal plaque.   Impression: No source for embolism noted.   Zachary Mccann 09/08/2018 11:45 AM

## 2018-09-08 NOTE — Care Management Note (Signed)
Case Management Note  Patient Details  Name: Zachary Mccann MRN: [id number] Date of Birth: 1971-03-14  Subjective/Objective:   Pt admitted with a stroke. He is from home with his girlfriend. Pt without a PCP. Was IADL.   Denies issues with obtaining medications except for Brilinta and denies transportation issues.              Action/Plan: CM consulted for outpatient therapy. Pt interested in attending at [facility] Outpatient therapy. CM entered orders in Epic and information on the AVS. Pt requesting assistance in finding a PCP in [place]. CM was able to speak with Dr Balinda Quails office and they will call the patient to arrange an appointment.  Pt with orders for a walker. James with [facility] DME notified and will deliver the equipment to the room. Pt states he has transportation home today.  Expected Discharge Date:                  Expected Discharge Plan:  OP Rehab  In-House Referral:     Discharge planning Services  CM Consult, Other - See comment(PCP, outpatient therapy)  Post Acute Care Choice:  Durable Medical Equipment Choice offered to:  Patient  DME Arranged:  Walker rolling DME Agency:  [facility]  HH Arranged:    HH Agency:     Status of Service:  Completed, signed off  If discussed at Long Length of Stay Meetings, dates discussed:    Additional Comments:  Kermit Balo, RN 09/08/2018, 3:29 PM

## 2018-09-08 NOTE — Interval H&P Note (Signed)
History and Physical Interval Note:  09/08/2018 11:30 AM  Wing T Vandezande  has presented today for surgery, with the diagnosis of stroke  The various methods of treatment have been discussed with the patient and family. After consideration of risks, benefits and other options for treatment, the patient has consented to  Procedure(s): TRANSESOPHAGEAL ECHOCARDIOGRAM (TEE) (N/A) as a surgical intervention .  The patient's history has been reviewed, patient examined, no change in status, stable for surgery.  I have reviewed the patient's chart and labs.  Questions were answered to the patient's satisfaction.     Laina Guerrieri [employer]

## 2018-09-08 NOTE — Anesthesia Postprocedure Evaluation (Signed)
Anesthesia Post Note  Patient: Zachary Mccann  Procedure(s) Performed: TRANSESOPHAGEAL ECHOCARDIOGRAM (TEE) (N/A )     Anesthesia Post Evaluation  Last Vitals:  Vitals:   09/08/18 1146 09/08/18 1155  BP: (!) 152/102 (!) 162/94  Pulse: 100 90  Resp: (!) 23 20  Temp: 36.6 C   SpO2: 97% 97%    Last Pain:  Vitals:   09/08/18 1155  TempSrc:   PainSc: 0-No pain                 Andy Allende,W. EDMOND

## 2018-09-08 NOTE — Progress Notes (Signed)
PT Cancellation Note  Patient Details Name: Zachary Mccann MRN: [id number] DOB: 1971-07-29   Cancelled Treatment:    Reason Eval/Treat Not Completed: Medical issues which prohibited therapy(Pt in tests all day.  )On last attempt, nurse asked if pt can d/c from PT standpoint.  Verbalized to nurse that last PT recommend Outpt PT for vestibular rehab.  Pt is also using RW.  Verbalized to nurse that if pt and wife feel comfortable going home with pt using RW and getting Outpt PT f/u, that should be fine.  If they have hesitation and feel that another session of PT is warranted, then PT will return in am.  Thanks.   Zachary Mccann 09/08/2018, 4:15 PM Zachary Mccann,PT Acute Rehabilitation Services Pager:  [phone]  Office:  [phone]

## 2018-09-08 NOTE — Discharge Summary (Signed)
Physician Discharge Summary  Zachary Mccann ZOX:[id number] DOB: May 16, 1971 DOA: 09/06/2018  PCP: Patient, No Pcp Per  Admit date: 09/06/2018 Discharge date: 09/08/2018  Admitted From: Home Disposition: Home   Recommendations for Outpatient Follow-up:  1. Follow up with PCP in 1-2 weeks. Dr. Patty Sermons office to call for establish patient appointment.  2. Follow up with cardiology as scheduled 3. Follow up with neurology in 6 weeks  Home Health: Outpatient PT/OT arranged per CM Equipment/Devices: 3 in 1, rolling walker Discharge Condition: Stable CODE STATUS: Full Diet recommendation: Heart healthy  Brief/Interim Summary: Zachary Mccann is a 47 y.o. male with a history of CAD s/p NSTEMI with DES to LCx May 2019, HTN, tobacco use, and medication nonadherence who presented to [facility] ED on 9/21 with dizziness, gait instability, and diffuse L > R weakness that began the previous evening. He was found to have an acute-early subacute left cerebellar SCE territory infarct with mild mass effect in addition to old right basal ganglia lacunar infarct. He was given labetalol for hypertensive emergency and transferred to [facility]. Work up including transesophageal echocardiogram, CT angiography of the head and neck, and telemetry have found no cardioembolic source, large vessel occlusion or stenosis, or atrial fibrillation. Since the patient has had allergy to aspirin, shortness of breath suspected to be due to brilinta, and now has had a stroke (a black box warning for effient which was planned to be started per cardiology) neurology recommends plavix at discharge in addition to statin, smoking cessation, and 30 day cardiac monitoring. He is stable for discharge.  Discharge Diagnoses:  Active Problems:   Essential hypertension   CKD (chronic kidney disease), stage III (HCC) = Secondary to Hypertension   Non-ST elevation (NSTEMI) myocardial infarction (HCC)   Cerebellar stroke (HCC)   Tobacco use  Left  cerebellar SCA territory CVA, acute-subacute: With persistent dysmetria, dizziness, gait instability. No arrhythmia on telemetry, no CES on TEE. CTA head and neck unremarkable.  - Due to cardiac history and cryptogenic stroke, neurology recommends 30-day monitoring. Contacted cardiology to get precertification after which the patient will be contacted.  - Allergy to aspirin, intolerance to brilinta, and contraindication to effient. Discussed at length with neurology and will plan on discharging on plavix. - HbA1c (5.4%), LDL (81). Continue high dose statin.   - Continue vestibular rehab/outpatient PT/OT, and DME arranged per CM.  CAD s/p NSTEMI with DES to Mid-distal LCx 100% stenosis May 2019: No chest pain, dyspnea.  - Has had intolerance to antiplatelets and beta blockers.  - Continue statin  ICM, chronic HFrEF: EF 35-45% on LHC angiography in May.  - Euvolemic. Continue home medications  HTN with hypertensive emergency: Given labetalol in ED.  - Ok to restart home medications bidil and norvasc  Stage II CKD: Cr at baseline with CrCl 32ml/min.  - Monitor  Tobacco use:  - Cessation counseling provided - Nicotine patch  Marijuana use:  - Cessation counseling provided  Discharge Instructions Discharge Instructions    Ambulatory referral to Occupational Therapy   Complete by:  As directed    Ambulatory referral to Physical Therapy   Complete by:  As directed    Diet - low sodium heart healthy   Complete by:  As directed    Discharge instructions   Complete by:  As directed    You were admitted for a stroke with a work up that did not reveal a definite cause. You are stable for discharge, but will need to change medications and  follow up as below:  - Do NOT take effient because patients who have had a stroke are at increased risk of bleeding with this medication.  - START taking plavix. This is very important to protect your heart stent AND to lower your risk of stroke. -  Continue taking your lipitor and blood pressure medications. - You will be contacted by the cardiology office to arrange longer term heart monitoring, which will look for an abnormal heart rhythm that could have caused the stroke.  - Follow up with Dr. Wyline Mood to discuss your medications soon - Follow up with neurology as below in the next 6 weeks - You will be contacted to schedule an appointment with a new primary doctor - If your symptoms WORSEN seek medical attention right away.   Increase activity slowly   Complete by:  As directed      Allergies as of 09/08/2018      Reactions   Aspirin    Swelling      Medication List    STOP taking these medications   prasugrel 10 MG Tabs tablet Commonly known as:  EFFIENT     TAKE these medications   acetaminophen 325 MG tablet Commonly known as:  TYLENOL Take 650 mg by mouth every 6 (six) hours as needed for mild pain.   amLODipine 5 MG tablet Commonly known as:  NORVASC Take 1 tablet (5 mg total) by mouth daily.   atorvastatin 80 MG tablet Commonly known as:  LIPITOR Take 1 tablet (80 mg total) by mouth daily at 6 PM.   clopidogrel 75 MG tablet Commonly known as:  PLAVIX Take 1 tablet (75 mg total) by mouth daily. Start taking on:  09/09/2018   HYDROcodone-acetaminophen 5-325 MG tablet Commonly known as:  NORCO/VICODIN 1 or 2 tabs PO q6 hours prn pain   isosorbide-hydrALAZINE 20-37.5 MG tablet Commonly known as:  BIDIL Take 1 tablet by mouth 3 (three) times daily.            Durable Medical Equipment  (From admission, onward)         Start     Ordered   09/08/18 1358  For home use only DME Walker rolling  Once    Question:  Patient needs a walker to treat with the following condition  Answer:  Stroke [facility])   09/08/18 1357         Follow-up Information    [facility] Follow up.   Specialty:  Rehabilitation Why:  They will contact you for the first appointment. Contact  information: [address] [id number] [place] [address] [phone]       Wilson Singer, MD Follow up.   Specialty:  Internal Medicine Why:  they will contact you to schedule the first appointment. Contact information: [address] [place] [address] [phone]        Antoine Poche, MD. Schedule an appointment as soon as possible for a visit in 1 week(s).   Specialty:  Cardiology Contact information: [address] [place] [address] [phone]        [facility]. Schedule an appointment as soon as possible for a visit in 6 week(s).   Specialty:  Neurology Contact information: [address] [place] [address] [phone]         Allergies  Allergen Reactions  . Aspirin     Swelling     Consultations:  Neurology  Procedures/Studies: Ct Angio Head W Or Wo Contrast  Result Date: 09/08/2018 CLINICAL DATA:  Left-sided weakness. EXAM: CT ANGIOGRAPHY HEAD AND NECK TECHNIQUE: Multidetector CT imaging of the head and neck was performed using the standard protocol during bolus administration of intravenous contrast. Multiplanar CT image reconstructions and MIPs were obtained to evaluate the vascular anatomy. Carotid stenosis measurements (when applicable) are obtained utilizing NASCET criteria, using the distal internal carotid diameter as the denominator. CONTRAST:  50mL ISOVUE-370 IOPAMIDOL (ISOVUE-370) INJECTION 76% COMPARISON:  None. Head CT 09/06/2018 FINDINGS: CT HEAD FINDINGS Brain: There is no mass, hemorrhage or extra-axial collection. The size and configuration of the ventricles and extra-axial CSF spaces are normal. Unchanged area of hypoattenuation in the superior left cerebellum. Small focus of hypoattenuation in the right basal ganglia is also unchanged. The brain parenchyma is normal. Skull: The visualized skull base, calvarium and extracranial soft tissues are  normal. Sinuses/Orbits: No fluid levels or advanced mucosal thickening of the visualized paranasal sinuses. No mastoid or middle ear effusion. The orbits are normal. CTA NECK FINDINGS AORTIC ARCH: There is no calcific atherosclerosis of the aortic arch. There is no aneurysm, dissection or hemodynamically significant stenosis of the visualized ascending aorta and aortic arch. Normal variant aortic arch branching pattern with the left vertebral artery arising independently from the aortic arch. The visualized proximal subclavian arteries are widely patent. RIGHT CAROTID SYSTEM: --Common carotid artery: Widely patent origin without common carotid artery dissection or aneurysm. --Internal carotid artery: No dissection, occlusion or aneurysm. No hemodynamically significant stenosis. --External carotid artery: No acute abnormality. LEFT CAROTID SYSTEM: --Common carotid artery: Widely patent origin without common carotid artery dissection or aneurysm. --Internal carotid artery:No dissection, occlusion or aneurysm. No hemodynamically significant stenosis. --External carotid artery: No acute abnormality. VERTEBRAL ARTERIES: Codominant configuration. Both origins are normal. No dissection, occlusion or flow-limiting stenosis to the vertebrobasilar confluence. SKELETON: There is no bony spinal canal stenosis. No lytic or blastic lesion. OTHER NECK: Normal pharynx, larynx and major salivary glands. No cervical lymphadenopathy. Unremarkable thyroid gland. UPPER CHEST: No pneumothorax or pleural effusion. No nodules or masses. CTA HEAD FINDINGS ANTERIOR CIRCULATION: --Intracranial internal carotid arteries: Normal. --Anterior cerebral arteries: Normal. Both A1 segments are present. Patent anterior communicating artery. --Middle cerebral arteries: Normal. --Posterior communicating arteries: Present bilaterally. POSTERIOR CIRCULATION: --Basilar artery: Normal. --Posterior cerebral arteries: Normal. --Superior cerebellar arteries:  Normal. --Inferior cerebellar arteries: Normal anterior and posterior inferior cerebellar arteries. VENOUS SINUSES: As permitted by contrast timing, patent. ANATOMIC VARIANTS: None DELAYED PHASE: No parenchymal contrast enhancement. Review of the MIP images confirms the above findings. IMPRESSION: None 1. Unchanged appearance of left cerebellar acute or early subacute infarct. Unchanged appearance of right basal ganglia infarct. 2. No acute hemorrhage. 3. No emergent large vessel occlusion or hemodynamically significant stenosis of the head or neck. Electronically Signed   By: Deatra Robinson M.D.   On: 09/08/2018 15:59   Ct Angio Neck W Or Wo Contrast  Result Date: 09/08/2018 CLINICAL DATA:  Left-sided weakness. EXAM: CT ANGIOGRAPHY HEAD AND NECK TECHNIQUE: Multidetector CT imaging of the head and neck was performed using the standard protocol during bolus administration of intravenous contrast. Multiplanar CT image reconstructions and MIPs were obtained to evaluate the vascular anatomy. Carotid stenosis measurements (when applicable) are obtained utilizing NASCET criteria, using the distal internal carotid diameter as the denominator. CONTRAST:  50mL ISOVUE-370 IOPAMIDOL (ISOVUE-370) INJECTION 76% COMPARISON:  None. Head CT 09/06/2018 FINDINGS: CT HEAD FINDINGS Brain: There is no mass, hemorrhage or extra-axial collection. The size and  configuration of the ventricles and extra-axial CSF spaces are normal. Unchanged area of hypoattenuation in the superior left cerebellum. Small focus of hypoattenuation in the right basal ganglia is also unchanged. The brain parenchyma is normal. Skull: The visualized skull base, calvarium and extracranial soft tissues are normal. Sinuses/Orbits: No fluid levels or advanced mucosal thickening of the visualized paranasal sinuses. No mastoid or middle ear effusion. The orbits are normal. CTA NECK FINDINGS AORTIC ARCH: There is no calcific atherosclerosis of the aortic arch. There is  no aneurysm, dissection or hemodynamically significant stenosis of the visualized ascending aorta and aortic arch. Normal variant aortic arch branching pattern with the left vertebral artery arising independently from the aortic arch. The visualized proximal subclavian arteries are widely patent. RIGHT CAROTID SYSTEM: --Common carotid artery: Widely patent origin without common carotid artery dissection or aneurysm. --Internal carotid artery: No dissection, occlusion or aneurysm. No hemodynamically significant stenosis. --External carotid artery: No acute abnormality. LEFT CAROTID SYSTEM: --Common carotid artery: Widely patent origin without common carotid artery dissection or aneurysm. --Internal carotid artery:No dissection, occlusion or aneurysm. No hemodynamically significant stenosis. --External carotid artery: No acute abnormality. VERTEBRAL ARTERIES: Codominant configuration. Both origins are normal. No dissection, occlusion or flow-limiting stenosis to the vertebrobasilar confluence. SKELETON: There is no bony spinal canal stenosis. No lytic or blastic lesion. OTHER NECK: Normal pharynx, larynx and major salivary glands. No cervical lymphadenopathy. Unremarkable thyroid gland. UPPER CHEST: No pneumothorax or pleural effusion. No nodules or masses. CTA HEAD FINDINGS ANTERIOR CIRCULATION: --Intracranial internal carotid arteries: Normal. --Anterior cerebral arteries: Normal. Both A1 segments are present. Patent anterior communicating artery. --Middle cerebral arteries: Normal. --Posterior communicating arteries: Present bilaterally. POSTERIOR CIRCULATION: --Basilar artery: Normal. --Posterior cerebral arteries: Normal. --Superior cerebellar arteries: Normal. --Inferior cerebellar arteries: Normal anterior and posterior inferior cerebellar arteries. VENOUS SINUSES: As permitted by contrast timing, patent. ANATOMIC VARIANTS: None DELAYED PHASE: No parenchymal contrast enhancement. Review of the MIP images  confirms the above findings. IMPRESSION: None 1. Unchanged appearance of left cerebellar acute or early subacute infarct. Unchanged appearance of right basal ganglia infarct. 2. No acute hemorrhage. 3. No emergent large vessel occlusion or hemodynamically significant stenosis of the head or neck. Electronically Signed   By: Deatra Robinson M.D.   On: 09/08/2018 15:59   [name]  Result Date: 09/07/2018 CLINICAL DATA:  47 y/o M; left-sided weakness. Stroke for follow-up. EXAM: MRI HEAD WITHOUT CONTRAST MRA HEAD WITHOUT CONTRAST TECHNIQUE: Multiplanar, multiecho pulse sequences of the brain and surrounding structures were obtained without intravenous contrast. Angiographic images of the head were obtained using MRA technique without contrast. COMPARISON:  None. FINDINGS: MRI HEAD FINDINGS Brain: Left SCA distribution reduced diffusion compatible with acute/early subacute infarction. Small chronic infarction involving the right lentiform nucleus extending into the corona radiata. Very small chronic infarctions of the left caudate head and left pons. No associated hemorrhage or mass effect. Several nonspecific T2 FLAIR hyperintensities in subcortical and periventricular white matter are compatible with moderate chronic microvascular ischemic changes for age. Mild volume loss of the brain. Vascular: As below. Skull and upper cervical spine: Normal marrow signal. Sinuses/Orbits: Mild ethmoid sinus mucosal thickening and partial opacification of the left maxillary sinus. No abnormal signal of the mastoid air cells. Orbits are unremarkable. Other: None. MRA HEAD FINDINGS Internal carotid arteries:  Patent. Anterior cerebral arteries:  Patent. Middle cerebral arteries: Patent. Posterior cerebral arteries:  Patent. Basilar artery:  Patent. Vertebral arteries:  Patent. Extensive motion artifact, suboptimal assessment for stenosis or small aneurysm. IMPRESSION:  MRI head: 1. Left SCA distribution acute/early  subacute infarction is stable in distribution. No associated hemorrhage or mass effect. 2. Small chronic infarction involving the right lentiform nucleus extending into the corona radiata. Very small chronic infarcts of the left caudate head and left pons. 3. Moderate for age chronic microvascular ischemic changes and mild volume loss of the brain. MRA head: 1. Extensive motion artifact, suboptimal assessment for stenosis or small aneurysm. 2. No large vessel intracranial occlusion identified. Electronically Signed   By: Mitzi Hansen M.D.   On: 09/07/2018 00:09   Mr Maxine Glenn Head Wo Contrast  Result Date: 09/07/2018 CLINICAL DATA:  47 y/o M; left-sided weakness. Stroke for follow-up. EXAM: MRI HEAD WITHOUT CONTRAST MRA HEAD WITHOUT CONTRAST TECHNIQUE: Multiplanar, multiecho pulse sequences of the brain and surrounding structures were obtained without intravenous contrast. Angiographic images of the head were obtained using MRA technique without contrast. COMPARISON:  None. FINDINGS: MRI HEAD FINDINGS Brain: Left SCA distribution reduced diffusion compatible with acute/early subacute infarction. Small chronic infarction involving the right lentiform nucleus extending into the corona radiata. Very small chronic infarctions of the left caudate head and left pons. No associated hemorrhage or mass effect. Several nonspecific T2 FLAIR hyperintensities in subcortical and periventricular white matter are compatible with moderate chronic microvascular ischemic changes for age. Mild volume loss of the brain. Vascular: As below. Skull and upper cervical spine: Normal marrow signal. Sinuses/Orbits: Mild ethmoid sinus mucosal thickening and partial opacification of the left maxillary sinus. No abnormal signal of the mastoid air cells. Orbits are unremarkable. Other: None. MRA HEAD FINDINGS Internal carotid arteries:  Patent. Anterior cerebral arteries:  Patent. Middle cerebral arteries: Patent. Posterior cerebral  arteries:  Patent. Basilar artery:  Patent. Vertebral arteries:  Patent. Extensive motion artifact, suboptimal assessment for stenosis or small aneurysm. IMPRESSION: MRI head: 1. Left SCA distribution acute/early subacute infarction is stable in distribution. No associated hemorrhage or mass effect. 2. Small chronic infarction involving the right lentiform nucleus extending into the corona radiata. Very small chronic infarcts of the left caudate head and left pons. 3. Moderate for age chronic microvascular ischemic changes and mild volume loss of the brain. MRA head: 1. Extensive motion artifact, suboptimal assessment for stenosis or small aneurysm. 2. No large vessel intracranial occlusion identified. Electronically Signed   By: Mitzi Hansen M.D.   On: 09/07/2018 00:09   Ct Head Code Stroke Wo Contrast  Result Date: 09/06/2018 CLINICAL DATA:  Code stroke. 47 year old male with left side weakness since 1730 hours yesterday. EXAM: CT HEAD WITHOUT CONTRAST TECHNIQUE: Contiguous axial images were obtained from the base of the skull through the vertex without intravenous contrast. COMPARISON:  None. FINDINGS: Brain: Partially empty sella. Curvilinear hypodensity in the right basal ganglia best demonstrated on coronal image 34 most resembles a chronic lacunar infarct. Minimal cerebral white matter hypodensity elsewhere. Cerebral cortical gray matter appears normal throughout. However, there is a 2 centimeter wedge-shaped area of hypodensity in the left superior cerebellum (series 2, image 11 and sagittal image 39). Mild regional mass effect but no hemorrhagic transformation. Basilar cisterns in the 4th ventricle remain patent. Other posterior fossa gray-white matter differentiation is normal. No midline shift, ventriculomegaly, mass effect, evidence of mass lesion, intracranial hemorrhage. Vascular: No suspicious intracranial vascular hyperdensity. Skull: Negative. Sinuses/Orbits: Left maxillary  mucoperiosteal thickening. Other visualized paranasal sinuses and mastoids are well pneumatized. Other: Visualized orbits and scalp soft tissues are within normal limits. ASPECTS [facility] Stroke Program Early CT Score) Total score (0-10 with 10 being  normal): 10, although there is cytotoxic edema in the left superior cerebellar artery territory. IMPRESSION: 1. Acute to subacute left cerebellar, SCA territory infarct. Mild mass effect, no associated hemorrhage. 2. Chronic appearing right basal ganglia lacunar infarct. No other No acute intracranial abnormality. 3. ASPECTS is 10, although there is cytotoxic edema in the left superior cerebellar artery territory. 4. Study discussed by telephone with Dr. Eber Hong on 09/06/2018 at 15:11 . Electronically Signed   By: Odessa Fleming M.D.   On: 09/06/2018 15:12    Procedure: TEE  Indication: CVA  Sedation: Per anesthesiology.   Findings: Please see echo section for full report.  Normal LV size with mild LV hypertrophy.  EF 45-50%, lateral hypokinesis.  Normal RV size and systolic function. There was trivial TR and trivial MR.  Trileaflet aortic valve with no stenosis or regurgitation. Normal left atrial size with no LA appendage thrombus.  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 Normal right atrial size. Negative bubble study, no evidence for PFO/ASD. Trileaflet aortic valve with no stenosis or regurgitation.  Normal caliber aorta with minimal plaque.   Impression: No source for embolism noted.   Marca Ancona 09/08/2018 11:45 AM  Subjective: Wants to go home. Left hand clumsiness is stable, no changes in deficits or new deficits. Getting around with a walker safely. No chest pain, dyspnea, palpitations, bleeding.  Discharge Exam: Vitals:   09/08/18 1155 09/08/18 1242  BP: (!) 162/94 (!) 185/115  Pulse: 90 91  Resp: 20 20  Temp:  (!) 97.5 F (36.4  C)  SpO2: 97% 100%   General: Pt is alert, awake, not in acute distress Cardiovascular: RRR, S1/S2 +, no rubs, no gallops Respiratory: CTA bilaterally, no wheezing, no rhonchi Abdominal: Soft, NT, ND, bowel sounds + Extremities: No edema, no cyanosis Neuro: Alert, oriented. finger to nose on left (dominant hand) is slow and ataxic. rapid alternating movements significantly slowed on left. Both normal on right. No weakness or sensory deficit.   Labs: BNP (last 3 results) No results for input(s): BNP in the last 8760 hours. Basic Metabolic Panel: Recent Labs  Lab 09/06/18 1511 09/06/18 1526 09/08/18 0646  NA 140 141 137  K 3.6 3.5 3.8  CL 107 107 104  CO2 24  --  21*  GLUCOSE 105* 101* 98  BUN 19 19 18   CREATININE 1.78* 1.90* 1.82*  CALCIUM 9.3  --  8.8*   Liver Function Tests: Recent Labs  Lab 09/06/18 1511  AST 23  ALT 32  ALKPHOS 118  BILITOT 1.1  PROT 8.2*  ALBUMIN 4.0   No results for input(s): LIPASE, AMYLASE in the last 168 hours. No results for input(s): AMMONIA in the last 168 hours. CBC: Recent Labs  Lab 09/06/18 1511 09/06/18 1526 09/08/18 0646  WBC 6.5  --  5.5  NEUTROABS 4.4  --   --   HGB 14.4 15.0 13.9  HCT 42.2 44.0 41.9  MCV 94.2  --  94.6  PLT 213  --  187   Cardiac Enzymes: No results for input(s): CKTOTAL, CKMB, CKMBINDEX, TROPONINI in the last 168 hours. BNP: Invalid input(s): POCBNP CBG: Recent Labs  Lab 09/06/18 1528  GLUCAP 107*   D-Dimer No results for input(s): DDIMER in the last 72 hours. Hgb A1c Recent Labs    09/08/18 0646  HGBA1C 5.4   Lipid Profile Recent Labs    09/08/18 0646  CHOL  128  HDL 35*  LDLCALC 81  TRIG 58  CHOLHDL 3.7   Thyroid function studies Recent Labs    09/08/18 0646  TSH 1.812   Anemia work up Recent Labs    09/08/18 0646  VITAMINB12 389   Urinalysis    Component Value Date/Time   COLORURINE STRAW (A) 09/06/2018 2222   APPEARANCEUR CLEAR 09/06/2018 2222   LABSPEC 1.008  09/06/2018 2222   PHURINE 6.0 09/06/2018 2222   GLUCOSEU NEGATIVE 09/06/2018 2222   HGBUR SMALL (A) 09/06/2018 2222   BILIRUBINUR NEGATIVE 09/06/2018 2222   KETONESUR NEGATIVE 09/06/2018 2222   PROTEINUR NEGATIVE 09/06/2018 2222   NITRITE NEGATIVE 09/06/2018 2222   LEUKOCYTESUR NEGATIVE 09/06/2018 2222    Microbiology No results found for this or any previous visit (from the past 240 hour(s)).  Time coordinating discharge: Approximately 40 minutes  Tyrone Nine, MD  [facility] 09/08/2018, 4:41 PM Pager [phone]

## 2018-09-08 NOTE — Progress Notes (Signed)
    [facility] has been requested to perform a transesophageal echocardiogram on 09/23 for stroke.  After careful review of history and examination, the risks and benefits of transesophageal echocardiogram have been explained including risks of esophageal damage, perforation (1:10,000 risk), bleeding, pharyngeal hematoma as well as other potential complications associated with conscious sedation including aspiration, arrhythmia, respiratory failure and death. Alternatives to treatment were discussed, questions were answered. Patient is willing to proceed.   Theodore Demark, PA-C 09/08/2018 10:04 AM

## 2018-09-08 NOTE — Progress Notes (Signed)
  Echocardiogram Echocardiogram Transesophageal has been performed.  Zachary Mccann 09/08/2018, 11:59 AM

## 2018-09-08 NOTE — Progress Notes (Addendum)
STROKE TEAM PROGRESS NOTE   HISTORY OF PRESENT ILLNESS (per record) This is a 47yr old man who had an MI in May, s/p stents and was d/c on Brelinta (ASA allergy). On 9/21 he presented to [facility] ER with 1 day report of Lt side weakness/gait trouble. MRI showed Lt SCA and Rt thalamic infarcts.    SUBJECTIVE (INTERVAL HISTORY) Stable neuro exam, still with Lt side weakness and numbness. Just came back from TEE, results pending. He tells me he was compliant with Murray Hodgkins, but that just this past Wed his Cardiologist switched him to Effient. Here he has been on Plavix. He confirms ASA allergy. Stroke education started today as we discussed his risk factors.   ROS: preformed and is negative except for above   OBJECTIVE Vitals:   09/08/18 1115 09/08/18 1146 09/08/18 1155 09/08/18 1242  BP: (!) 176/104 (!) 152/102 (!) 162/94 (!) 185/115  Pulse:  100 90 91  Resp:  (!) 23 20 20   Temp:  97.8 F (36.6 C)  (!) 97.5 F (36.4 C)  TempSrc:  Oral  Oral  SpO2:  97% 97% 100%  Weight:      Height:        CBC:  Recent Labs  Lab 09/06/18 1511 09/06/18 1526 09/08/18 0646  WBC 6.5  --  5.5  NEUTROABS 4.4  --   --   HGB 14.4 15.0 13.9  HCT 42.2 44.0 41.9  MCV 94.2  --  94.6  PLT 213  --  187    Basic Metabolic Panel:  Recent Labs  Lab 09/06/18 1511 09/06/18 1526 09/08/18 0646  NA 140 141 137  K 3.6 3.5 3.8  CL 107 107 104  CO2 24  --  21*  GLUCOSE 105* 101* 98  BUN 19 19 18   CREATININE 1.78* 1.90* 1.82*  CALCIUM 9.3  --  8.8*    Lipid Panel:     Component Value Date/Time   CHOL 128 09/08/2018 0646   TRIG 58 09/08/2018 0646   HDL 35 (L) 09/08/2018 0646   CHOLHDL 3.7 09/08/2018 0646   VLDL 12 09/08/2018 0646   LDLCALC 81 09/08/2018 0646   HgbA1c:  Lab Results  Component Value Date   HGBA1C 5.4 09/08/2018   Urine Drug Screen:     Component Value Date/Time   LABOPIA NONE DETECTED 09/06/2018 2222   COCAINSCRNUR NONE DETECTED 09/06/2018 2222   LABBENZ NONE DETECTED  09/06/2018 2222   AMPHETMU NONE DETECTED 09/06/2018 2222   THCU POSITIVE (A) 09/06/2018 2222   LABBARB NONE DETECTED 09/06/2018 2222    Alcohol Level     Component Value Date/Time   ETH <10 09/06/2018 1511    IMAGING   [name]  Result Date: 09/07/2018 CLINICAL DATA:  47 y/o M; left-sided weakness. Stroke for follow-up. EXAM: MRI HEAD WITHOUT CONTRAST MRA HEAD WITHOUT CONTRAST TECHNIQUE: Multiplanar, multiecho pulse sequences of the brain and surrounding structures were obtained without intravenous contrast. Angiographic images of the head were obtained using MRA technique without contrast. COMPARISON:  None. FINDINGS: MRI HEAD FINDINGS Brain: Left SCA distribution reduced diffusion compatible with acute/early subacute infarction. Small chronic infarction involving the right lentiform nucleus extending into the corona radiata. Very small chronic infarctions of the left caudate head and left pons. No associated hemorrhage or mass effect. Several nonspecific T2 FLAIR hyperintensities in subcortical and periventricular white matter are compatible with moderate chronic microvascular ischemic changes for age. Mild volume loss of the brain. Vascular: As below. Skull  and upper cervical spine: Normal marrow signal. Sinuses/Orbits: Mild ethmoid sinus mucosal thickening and partial opacification of the left maxillary sinus. No abnormal signal of the mastoid air cells. Orbits are unremarkable. Other: None. MRA HEAD FINDINGS Internal carotid arteries:  Patent. Anterior cerebral arteries:  Patent. Middle cerebral arteries: Patent. Posterior cerebral arteries:  Patent. Basilar artery:  Patent. Vertebral arteries:  Patent. Extensive motion artifact, suboptimal assessment for stenosis or small aneurysm. IMPRESSION: MRI head: 1. Left SCA distribution acute/early subacute infarction is stable in distribution. No associated hemorrhage or mass effect. 2. Small chronic infarction involving the right lentiform  nucleus extending into the corona radiata. Very small chronic infarcts of the left caudate head and left pons. 3. Moderate for age chronic microvascular ischemic changes and mild volume loss of the brain. MRA head: 1. Extensive motion artifact, suboptimal assessment for stenosis or small aneurysm. 2. No large vessel intracranial occlusion identified. Electronically Signed   By: Mitzi Hansen M.D.   On: 09/07/2018 00:09   Mr Maxine Glenn Head Wo Contrast  Result Date: 09/07/2018 CLINICAL DATA:  47 y/o M; left-sided weakness. Stroke for follow-up. EXAM: MRI HEAD WITHOUT CONTRAST MRA HEAD WITHOUT CONTRAST TECHNIQUE: Multiplanar, multiecho pulse sequences of the brain and surrounding structures were obtained without intravenous contrast. Angiographic images of the head were obtained using MRA technique without contrast. COMPARISON:  None. FINDINGS: MRI HEAD FINDINGS Brain: Left SCA distribution reduced diffusion compatible with acute/early subacute infarction. Small chronic infarction involving the right lentiform nucleus extending into the corona radiata. Very small chronic infarctions of the left caudate head and left pons. No associated hemorrhage or mass effect. Several nonspecific T2 FLAIR hyperintensities in subcortical and periventricular white matter are compatible with moderate chronic microvascular ischemic changes for age. Mild volume loss of the brain. Vascular: As below. Skull and upper cervical spine: Normal marrow signal. Sinuses/Orbits: Mild ethmoid sinus mucosal thickening and partial opacification of the left maxillary sinus. No abnormal signal of the mastoid air cells. Orbits are unremarkable. Other: None. MRA HEAD FINDINGS Internal carotid arteries:  Patent. Anterior cerebral arteries:  Patent. Middle cerebral arteries: Patent. Posterior cerebral arteries:  Patent. Basilar artery:  Patent. Vertebral arteries:  Patent. Extensive motion artifact, suboptimal assessment for stenosis or small  aneurysm. IMPRESSION: MRI head: 1. Left SCA distribution acute/early subacute infarction is stable in distribution. No associated hemorrhage or mass effect. 2. Small chronic infarction involving the right lentiform nucleus extending into the corona radiata. Very small chronic infarcts of the left caudate head and left pons. 3. Moderate for age chronic microvascular ischemic changes and mild volume loss of the brain. MRA head: 1. Extensive motion artifact, suboptimal assessment for stenosis or small aneurysm. 2. No large vessel intracranial occlusion identified. Electronically Signed   By: Mitzi Hansen M.D.   On: 09/07/2018 00:09   Ct Head Code Stroke Wo Contrast  Result Date: 09/06/2018 CLINICAL DATA:  Code stroke. 47 year old male with left side weakness since 1730 hours yesterday. EXAM: CT HEAD WITHOUT CONTRAST TECHNIQUE: Contiguous axial images were obtained from the base of the skull through the vertex without intravenous contrast. COMPARISON:  None. FINDINGS: Brain: Partially empty sella. Curvilinear hypodensity in the right basal ganglia best demonstrated on coronal image 34 most resembles a chronic lacunar infarct. Minimal cerebral white matter hypodensity elsewhere. Cerebral cortical gray matter appears normal throughout. However, there is a 2 centimeter wedge-shaped area of hypodensity in the left superior cerebellum (series 2, image 11 and sagittal image 39). Mild regional mass effect but no hemorrhagic transformation.  Basilar cisterns in the 4th ventricle remain patent. Other posterior fossa gray-white matter differentiation is normal. No midline shift, ventriculomegaly, mass effect, evidence of mass lesion, intracranial hemorrhage. Vascular: No suspicious intracranial vascular hyperdensity. Skull: Negative. Sinuses/Orbits: Left maxillary mucoperiosteal thickening. Other visualized paranasal sinuses and mastoids are well pneumatized. Other: Visualized orbits and scalp soft tissues are  within normal limits. ASPECTS [facility] Stroke Program Early CT Score) Total score (0-10 with 10 being normal): 10, although there is cytotoxic edema in the left superior cerebellar artery territory. IMPRESSION: 1. Acute to subacute left cerebellar, SCA territory infarct. Mild mass effect, no associated hemorrhage. 2. Chronic appearing right basal ganglia lacunar infarct. No other No acute intracranial abnormality. 3. ASPECTS is 10, although there is cytotoxic edema in the left superior cerebellar artery territory. 4. Study discussed by telephone with Dr. Eber Hong on 09/06/2018 at 15:11 . Electronically Signed   By: Odessa Fleming M.D.   On: 09/06/2018 15:12     Transthoracic Echocardiogram - just returned from procedure at time of my exam; pending report 00/00/00  PHYSICAL EXAM Blood pressure (!) 185/115, pulse 91, temperature (!) 97.5 F (36.4 C), temperature source Oral, resp. rate 20, height 6' (1.829 m), weight 127.5 kg, SpO2 100 %.    HOME MEDICATIONS:  Medications Prior to Admission  Medication Sig Dispense Refill  . acetaminophen (TYLENOL) 325 MG tablet Take 650 mg by mouth every 6 (six) hours as needed for mild pain.    [name] amLODipine (NORVASC) 5 MG tablet Take 1 tablet (5 mg total) by mouth daily. 180 tablet 3  . atorvastatin (LIPITOR) 80 MG tablet Take 1 tablet (80 mg total) by mouth daily at 6 PM. 90 tablet 3  . prasugrel (EFFIENT) 10 MG TABS tablet On day 1 take 60 mg (6 tablets) , then take 10 mg daily. 35 tablet 3  . HYDROcodone-acetaminophen (NORCO/VICODIN) 5-325 MG tablet 1 or 2 tabs PO q6 hours prn pain 15 tablet 0  . isosorbide-hydrALAZINE (BIDIL) 20-37.5 MG tablet Take 1 tablet by mouth 3 (three) times daily. 270 tablet 3      HOSPITAL MEDICATIONS:  . amLODipine  5 mg Oral Daily  . atorvastatin  80 mg Oral q1800  . clopidogrel  75 mg Oral Daily  . heparin  5,000 Units Subcutaneous Q8H  . isosorbide-hydrALAZINE  1 tablet Oral TID  . nicotine  14 mg Transdermal Daily     ALLERGIES Allergies  Allergen Reactions  . Aspirin     Swelling     PAST MEDICAL HISTORY Past Medical History:  Diagnosis Date  . CAD (coronary artery disease)    a. s/p NSTEMI in 04/2018 with DES to LCx.   [name] Hypertension   . Myocardial infarction (HCC)   . Pneumonia     SURGICAL HISTORY Past Surgical History:  Procedure Laterality Date  . CORONARY STENT INTERVENTION N/A 05/05/2018   Procedure: CORONARY STENT INTERVENTION;  Surgeon: Marykay Lex, MD;  Location: [facility] INVASIVE CV LAB;  Service: Cardiovascular;  Laterality: N/A;  . LEFT HEART CATH AND CORONARY ANGIOGRAPHY N/A 05/05/2018   Procedure: LEFT HEART CATH AND CORONARY ANGIOGRAPHY;  Surgeon: Marykay Lex, MD;  Location[facility] INVAS[facility] CV LAB;  Service: Cardiovascular;  Laterality: N/A;    FAMILY HISTORY Family History  Problem Relation Age of Onset  . Hypertension Mother   . Cancer Mother   . Hypertension Father     SOCIAL HISTORY  reports that he has been smoking cigarettes. He has been smoking about 0.50  packs per day. He has never used smokeless tobacco. He reports that he drinks alcohol. He reports that he has current or past drug history. Drug: Marijuana.  ASSESSMENT/PLAN Mr. KAIMANI CLAYSON is a 47 y.o. male with recent MI, s/p stent. Now presenting with acute L SCA and R Thalamus Stroke. He did not receive IV t-PA due to presenting outside the treatment window.    Stroke etiology is unclear at this time, wk up underwy  CTA head/Neck: pending  MRI head - Lt SCA and Rt thalamic infarct  TEE - pending  LDL 81, on statin at home  HgbA1c - 5.4, no dm2 dx  VTE prophylaxis - Lovenox  Diet cleared by SLP: heart healthy  Brelinta prior to admission (but with new cardiology plan to switch to Effient just days PTA), now on Plavix here. ASA allergy noted  Ongoing aggressive stroke risk factor management  Therapy recommendations:  pending  Disposition:   Pending  Hypertension  Stable .  Permissive hypertension (OK if < 220/120) but gradually normalize in next 3-4 days .  Long-term BP goal normotensive  Hyperlipidemia  Lipid lowering medication PTA: Lipitor  LDL 81, goal < 70  Current lipid lowering medication:80mg  Lipitor  Continue statin at discharge   Other Stroke Risk Factors  Current Cigarette smoker; advised to stop smoking  Current ETOH use, advised to drink no more than 1-2 drink(s) a day  Obesity, Body mass index is 38.11 kg/m., recommend weight loss, diet and exercise as appropriate   Current Coronary artery disease, post recent MI and stent  [facility]   Hospital day # 2  Desiree Metzger-Cihelka, MSN, ARNP-C  ATTENDING NOTE: I reviewed above note and agree with the assessment and plan. Pt was seen and examined.   47 year old male with history of hypertension, non-STEMI status post stent in 04/2018, smoker admitted for left-sided weakness, vertigo and imbalance.  As per note, he continued to smoke although not as heavy as before since heart attack, continue using alcohol and THC.  Not able to tolerate aspirin due to allergy, not able to tolerating Brilinta due to erectile dysfunction, not taking effient due to financial difficulty.  CT showed left superior cerebellar infarct and old right BG infarct.  MRI confirmed left SCA infarct and old right CR/BG left pontine and left caudate head infarcts.  UDS showed THC positive.  CT head and neck unremarkable.  LDL 81 and A1c 5.4.  Creatinine 1.9.  TTE showed EF 35 to 40%.  TEE EF 45 to 50% with lateral hypokinesis, no PFO.  Hypercoagulable work-up pending.  Patient stroke concerning for large vessel atherosclerosis given multiple stroke risk factors.  However, given history of non-STEMI and decreased EF, PAF cannot be completely ruled out.  Recommend outpatient 30-day cardio event monitoring to rule out A. fib.    Given current stroke, Effient is contraindicated as per  black box warning.  Recommend Plavix for stroke and cardiac prevention.  Continue high-dose Lipitor.  Educated on medication compliance.  Aggressive risk factor modification.  Patient is willing to quit smoking.  Neurology will sign off. Please call with questions. Pt will follow up with stroke clinic NP at [facility] in about 4 weeks. Thanks for the consult.   Marvel Plan, MD PhD Stroke Neurology 09/08/2018 10:32 PM     To contact Stroke Continuity provider, please refer to [email or web address]. After hours, contact General Neurology

## 2018-09-08 NOTE — Transfer of Care (Signed)
Immediate Anesthesia Transfer of Care Note  Patient: Zachary Mccann  Procedure(s) Performed: TRANSESOPHAGEAL ECHOCARDIOGRAM (TEE) (N/A )  Patient Location: Endoscopy Unit  Anesthesia Type:MAC  Level of Consciousness: drowsy and patient cooperative  Airway & Oxygen Therapy: Patient Spontanous Breathing and Patient connected to nasal cannula oxygen  Post-op Assessment: Report given to RN and Post -op Vital signs reviewed and stable  Post vital signs: Reviewed and stable  Last Vitals:  Vitals Value Taken Time  BP    Temp    Pulse 104 09/08/2018 11:46 AM  Resp 29 09/08/2018 11:46 AM  SpO2 98 % 09/08/2018 11:46 AM  Vitals shown include unvalidated device data.  Last Pain:  Vitals:   09/08/18 1024  TempSrc: Oral  PainSc: 0-No pain         Complications: No apparent anesthesia complications

## 2018-09-09 ENCOUNTER — Other Ambulatory Visit: Payer: Self-pay | Admitting: Physician Assistant

## 2018-09-09 DIAGNOSIS — I63341 Cerebral infarction due to thrombosis of right cerebellar artery: Secondary | ICD-10-CM

## 2018-09-10 ENCOUNTER — Telehealth (HOSPITAL_COMMUNITY): Payer: Self-pay | Admitting: General Practice

## 2018-09-10 ENCOUNTER — Ambulatory Visit (HOSPITAL_COMMUNITY): Payer: BLUE CROSS/BLUE SHIELD | Admitting: Occupational Therapy

## 2018-09-10 ENCOUNTER — Ambulatory Visit (HOSPITAL_COMMUNITY): Payer: BLUE CROSS/BLUE SHIELD | Attending: Family Medicine | Admitting: Physical Therapy

## 2018-09-10 DIAGNOSIS — R29818 Other symptoms and signs involving the nervous system: Secondary | ICD-10-CM

## 2018-09-10 LAB — ANTIPHOSPHOLIPID SYNDROME EVAL, BLD
ANTICARDIOLIPIN IGA: 24 U/mL — AB (ref 0–11)
Anticardiolipin IgM: <9 MPL U/mL (ref 0–12)
DRVVT: 39.3 s (ref 0.0–47.0)
PHOSPHATYDALSERINE, IGG: 2 {GPS'U} (ref 0–11)
PTT Lupus Anticoagulant: 33.9 s (ref 0.0–51.9)
Phosphatydalserine, IgA: 32 APS IgA — ABNORMAL HIGH (ref 0–20)
Phosphatydalserine, IgM: 4 MPS IgM (ref 0–25)

## 2018-09-10 NOTE — Telephone Encounter (Signed)
09/10/18  pt has his PT eval but not OT because his blood pressure was elevated and the patient was going to call his dr.

## 2018-09-10 NOTE — Therapy (Signed)
[facility] [address] [place], [place], [address] Phone: [phone]   Fax:  [phone]  Patient Details  Name: Zachary Mccann MRN: [id number] Date of Birth: 1971-01-22 Referring Provider:  Tyrone Nine, MD  Encounter Date: 09/10/2018  Patient presented to physical therapy for evaluation at 9:15. Therapist took patient's blood pressure manually and it was found to be 180/108. Patient reported that his blood pressure has been high since seeing his cardiac physician last Wednesday. Therapist discussed with patient that he should call his physician to discuss altering his blood pressure medication and discuss his elevated blood pressure. He was instructed to go to the hospital if his blood pressure increased. Patient was informed that his evaluation appointments would be rescheduled for a later date once the patient had followed up with his physician.    Verne Carrow PT, DPT 9:38 AM, 09/10/18 [phone]    [facility] [address] [place], [place], [address] Phone: [phone]   Fax:  [phone]

## 2018-09-11 ENCOUNTER — Other Ambulatory Visit: Payer: Self-pay

## 2018-09-11 NOTE — Patient Outreach (Signed)
[facility]) Care Management  09/11/2018  LIONEL SUDA 02-01-1971 [id number]  EMMI: stroke red alert Referral date: 09/11/18 Referral reason: questions problems with medications: yes,  Problems with setting up rehab: yes Day # 1  Telephone call to patient regarding EMMI stroke red alert. Unable to reach patient. HIPAA compliant voice message left with call back phone number.   PLAN; RNCM will attempt 2nd telephone call to patient within 4 business days.   George Ina RN,BSN,CCM [facility] Telephonic  [phone]

## 2018-09-12 ENCOUNTER — Telehealth: Payer: Self-pay | Admitting: Cardiology

## 2018-09-12 ENCOUNTER — Other Ambulatory Visit: Payer: Self-pay

## 2018-09-12 NOTE — Patient Outreach (Addendum)
[facility]) Care Management  09/12/2018  Zachary Mccann [birth year] [id number]   EMMI: stroke red alert Referral date: 09/11/18 Referral reason: questions problems with medications: yes,  Problems with setting up rehab: yes Day # 1  Telephone call to patient regarding EMMi stroke red alert. HIPAA verified. Explained reason for call.  Patient states he is not having any problems with his medications. Patient states he has his medications and takes them as prescribed.  Patient reports he was unable to participate in his physical therapy on Tuesday 09/10/18.  Patient states his blood pressure was to high so they would not allow him to do therapy. Patient states the therapist told him to follow up with his doctor. Patient states he called and left a message.  Patient states he has an appointment scheduled with his new primary MD on 11/03/18.  Patient unable to recalls doctors name. He states he is not at home right now and states he has all of his information written down at his house. Patient states he will not be able to do his rehab until they hear from his doctor.  Patient states he is not at home now and verbalized agreement to follow up call with [facility]  ASSESSMENT: Per chart review of rehab visit on 09/10/18, patients blood pressure was documented at 180/108.   RNCM attempted call to patients cardiology office, Dr Dina Rich.  Message left with call back number  requesting return call.   PLAN; RNCM will follow up with patient within 4 business days.   George Ina RN,BSN,CCM [facility] Telephonic  [phone]

## 2018-09-12 NOTE — Telephone Encounter (Signed)
Pt had BP on 09/10/18 by PT and noted BP 180/108, pt was told to call us and he did not call us. [facility] wanted [place] to know

## 2018-09-12 NOTE — Patient Outreach (Signed)
[facility]) Care Management  09/12/2018  DORRIS MATON 02-22-1971 [id number]   Received call from [facility] with Dr. Reginia Naas office.  She states she will inform Dr. Wyline Mood of patients elevated blood pressure reading and office will follow up with patient.   PLAN; RNCM will follow up with patient at next scheduled outreach .  George Ina RN,BSN,CCM [facility] Telephonic  [phone]

## 2018-09-12 NOTE — Telephone Encounter (Signed)
Zachary Ina RN Case Manager w/ [facility] called concerning Zachary Mccann blood pressure, he had a physical therapy visit on 09/10/18 and his bp was elevated. Pt was supposed to call the office w/ those readings. He has recently been d/c from the hospital w/ a Stroke.   Please call Ms. Green @ [phone]

## 2018-09-15 ENCOUNTER — Other Ambulatory Visit: Payer: Self-pay

## 2018-09-15 ENCOUNTER — Ambulatory Visit: Payer: Self-pay

## 2018-09-15 MED ORDER — AMLODIPINE BESYLATE 10 MG PO TABS: 10.0000 mg | ORAL_TABLET | Freq: Every day | ORAL | 3 refills | Status: DC

## 2018-09-15 NOTE — Telephone Encounter (Signed)
Increase norvasc to 10mg  daily   J Peyten Weare MD

## 2018-09-15 NOTE — Patient Outreach (Signed)
[facility]) Care Management  09/15/2018  MATEUSZ ROLLS 05/29/1971 [id number]  EMMI:stroke red alert Referral date:09/11/18 Referral reason:Feeling worse overall: yes Day #3  Telephone call to patient regarding referral. Unable to reach patient. HIPAA compliant voice message left with call back phone number.   PLAN: RNCM will attempt 2nd telephone call to patient within 4 business days.  George Ina RN,BSN, CCM [facility] Telephonic  [phone]

## 2018-09-15 NOTE — Patient Outreach (Signed)
[facility]) Care Management  09/15/2018  Zachary Mccann 06-Oct-1971 [id number]  EMMI:stroke red alert Referral date:09/11/18 Referral reason:Feeling worse overall: yes Day #3  Phone message update received from [facility] with Dr. Reginia Naas office stating Dr. Wyline Mood is increasing patients Norvasc to 10 mg daily.  She states their office will call new prescription in for patient.  Also she will call and inform patient of this prescription change.   PLAN: RNCM will follow up with patient at next scheduled  Outreach  George Ina RN,BSN,CCM [facility] Telephonic  [phone]

## 2018-09-15 NOTE — Telephone Encounter (Signed)
Pt will take 2 of his Amlodipine 5 mg until bottle finished. His new rx is for 10 mg daily.I asked him to check is BP daily, he says he will.

## 2018-09-16 ENCOUNTER — Ambulatory Visit: Payer: BLUE CROSS/BLUE SHIELD

## 2018-09-16 ENCOUNTER — Other Ambulatory Visit: Payer: Self-pay

## 2018-09-16 ENCOUNTER — Telehealth: Payer: Self-pay

## 2018-09-16 NOTE — Patient Outreach (Addendum)
[facility]) Care Management  09/16/2018  Zachary Mccann 02/19/1971 [id number]  EMMI:stroke red alert Referral date:09/11/18 Referral reason:Feeling worse overall: yes Day #3  Telephone call to patient for follow up. HIPAA verified. Patient states he looked at his medication bottles and he is taking the Norvasc 10 mg 1 time per day and taking his Bidil 3 times per day as prescribed.  Patient states he has noticed that when he burps his breath smells like rotten eggs. Patient states this has just started over the last day or so. Patient denies any other symptoms. Patient states  He is not scheduled to see his new primary MD until 11/18./19.  RNCM offered to call new primary MD office to request earlier appointment. Patient verbally agreed.  RNCm contacted patients new primary MD provider office, Dr. Karilyn Cota.  Spoke with Nellie.  RNCM requested earlier appointment date for patient due to recent hospitalization discharge.  Nellie rescheduled patient to see Nurse practitioner, Renda Rolls October 08, 2018 at 1:45pm.  RNCM called patient and informed him of new primary MD appointment with nurse practitioner, Renda Rolls for October 08, 2018 at 1:45 pm.  Patient expressed his appreciation.   PLAN: RNCM will follow up with patient within  3 weeks.   George Ina RN,BSN,CCM [facility] Telephonic  [phone]

## 2018-09-16 NOTE — Patient Outreach (Signed)
[facility]) Care Management  09/16/2018  Zachary Mccann 02-Jul-1971 [id number]  EMMI:stroke red alert Referral date:09/11/18 Referral reason:Feeling worse overall: yes Day #3  Telephone call to patient regarding EMMI stroke follow up. HIPAA verified with patient.  Patient states he received a call from [facility] with Dr. Reginia Naas office on yesterday stating Dr. Wyline Mood increased his blood pressure medication Norvasc to 10 mg.  Patient states he already has the 5 mg so he is taking 2 tablets to equal the 10 as advised by Olegario Messier.  Patient states he feels a little woosy.  RNCM inquired how often patient is taking the Norvasc.  Patient reports he is taking the medication 3 times per day.  RNCM informed patient that she would call Dr. Reginia Naas office to verify how often he is to take the medication per day.    RNCM called and spoke with Misty Stanley at Dr. Reginia Naas office.   Misty Stanley verified patient is to take Norvasc 10 mg 1 time per day.  RNCM informed Misty Stanley that patient states he is taking the Norvasc 3 times per day.  Misty Stanley states if patient continues to be symptomatic return call to Dr. Reginia Naas office so that patient can be seen.  Increasing symptoms go to the emergency room.   RNCM contacted patient.  Informed patient he is to take Norvasc 10 mg 1 time per day as stated by his cardiologist, Dr. Wyline Mood.  Patient states he may have been confused. Patient states he takes one of his medications 1 time per day and another medication 3 times per day. Patient states he is unsure of the name at this time. Patient reports he is not home to look at the bottles.   RNCM advised patient to return call once he has returned home to further discuss medication appropriateness.  Patient  Verbalized understanding and agreement.   PLAN:  RNCM will follow up with patient within 2 business days.  George Ina RN,BSN,CCM [facility] Telephonic  [phone]

## 2018-09-16 NOTE — Telephone Encounter (Signed)
THN - RN called to inform Dr. Wyline Mood that the patient has taken his Norvasc incorrectly. He has taken in three times a day (she's thinking he has just started that yesterday) But she has called to verify it was to be taken once daily. I verified it and she will advise pt. I asked her to call me if he is symptomatic and we will advise him further. Will forward to Dr. Wyline Mood as an Lorain Childes.

## 2018-09-18 ENCOUNTER — Other Ambulatory Visit: Payer: Self-pay

## 2018-09-18 ENCOUNTER — Telehealth (HOSPITAL_COMMUNITY): Payer: Self-pay | Admitting: Physical Therapy

## 2018-09-18 NOTE — Telephone Encounter (Signed)
Please call George Ina -- [phone]  Pt is needing a post hosp f/u w/ Dr. Wyline Mood -scheduled him for 09/23/18. Also needing a 30 day monitor

## 2018-09-18 NOTE — Telephone Encounter (Signed)
Discussed with Britt Boozer, Mr. Roddey's case manager, that Mr. Camfield should follow-up with cardiologist, be cleared for physical activity, and also have blood pressure managed consistently before returning for a physical therapy evaluation. She stated that she had been in contact with the patient and would be following up.  Verne Carrow PT, DPT 5:03 PM, 09/18/18 [phone]

## 2018-09-18 NOTE — Telephone Encounter (Signed)
Pt has an appointment with Dr. Wyline Mood next week. He will discuss the event monitor.

## 2018-09-18 NOTE — Patient Outreach (Signed)
[facility]) Care Management  09/18/2018  TAVAROUS MAESE [birth year] [id number]  EMMI:stroke red alert Referral date:09/11/18  Telephone call to patients cardiology office. Message left with Olegario Messier or Misty Stanley requesting return call.   Telephone call to patient. HIPAA verified.  Patient states he does not have a follow up appointment scheduled with the cardiologist. He reports the only up coming appointment is with his new primary MD on 10/08/18.  Patient states he tried to walk to the corner store today.  Patient states, " the trip did not go well. " Patient reports he was off balance quite a bit going and coming back from the store. Patient states he used his cane to walk with.  Patient states he had to stop at 2 abandoned houses to sit down and rest before making back to his home. RNCM inquired if patient received walker at discharge from the hospital.  Patient states that he did.  Patient states he is aware that he may need to use the walker more.  RNCM discussed with patient importance of having follow up with doctor to assess blood pressure and overall condition. RNCM suggested to patient if he is going out with someone to stop by a [facility], [facility], or [facility] to check his blood pressure. Patient verbalized understanding.   Plan: RNCM will follow up with patient within 1 week  If no return call RNCM will attempt 2nd outreach call to cardiology office.   George Ina RN,BSN,CCM [facility] Telephonic  [phone]

## 2018-09-23 ENCOUNTER — Ambulatory Visit (INDEPENDENT_AMBULATORY_CARE_PROVIDER_SITE_OTHER): Payer: BLUE CROSS/BLUE SHIELD | Admitting: Cardiology

## 2018-09-23 ENCOUNTER — Encounter: Payer: Self-pay | Admitting: Cardiology

## 2018-09-23 VITALS — BP 154/76 | HR 86 | Ht 72.0 in | Wt 289.0 lb

## 2018-09-23 DIAGNOSIS — I5022 Chronic systolic (congestive) heart failure: Secondary | ICD-10-CM

## 2018-09-23 DIAGNOSIS — I63341 Cerebral infarction due to thrombosis of right cerebellar artery: Secondary | ICD-10-CM | POA: Diagnosis not present

## 2018-09-23 DIAGNOSIS — R002 Palpitations: Secondary | ICD-10-CM | POA: Diagnosis not present

## 2018-09-23 DIAGNOSIS — I10 Essential (primary) hypertension: Secondary | ICD-10-CM

## 2018-09-23 DIAGNOSIS — Z23 Encounter for immunization: Secondary | ICD-10-CM

## 2018-09-23 DIAGNOSIS — I2510 Atherosclerotic heart disease of native coronary artery without angina pectoris: Secondary | ICD-10-CM

## 2018-09-23 DIAGNOSIS — E782 Mixed hyperlipidemia: Secondary | ICD-10-CM

## 2018-09-23 MED ORDER — CARVEDILOL 3.125 MG PO TABS: 3.1250 mg | ORAL_TABLET | Freq: Two times a day (BID) | ORAL | 3 refills | Status: DC

## 2018-09-23 MED ORDER — HYDRALAZINE HCL 50 MG PO TABS: 50.0000 mg | ORAL_TABLET | Freq: Three times a day (TID) | ORAL | 3 refills | Status: DC

## 2018-09-23 MED ORDER — ISOSORBIDE MONONITRATE ER 30 MG PO TB24: 30.0000 mg | ORAL_TABLET | Freq: Every day | ORAL | 3 refills | Status: DC

## 2018-09-23 NOTE — Patient Instructions (Signed)
Medication Instructions:  STOP BIDIL   START COREG 3.125 MG- TWO TIMES DAILY  START HYDRALAZINE 50 MG- THREE TIMES DAILY  START IMDUR 30 MG DAILY    Labwork: NONE  Testing/Procedures: Your physician has recommended that you wear an event monitor. Event monitors are medical devices that record the heart's electrical activity. Doctors most often [place] these monitors to diagnose arrhythmias. Arrhythmias are problems with the speed or rhythm of the heartbeat. The monitor is a small, portable device. You can wear one while you do your normal daily activities. This is usually used to diagnose what is causing palpitations/syncope (passing out). (30 DAYS)     Follow-Up: Your physician recommends that you schedule a follow-up appointment in: 6 WEEKS    Any Other Special Instructions Will Be Listed Below (If Applicable).     If you need a refill on your cardiac medications before your next appointment, please call your pharmacy.

## 2018-09-23 NOTE — Progress Notes (Signed)
Clinical Summary Mr. Avitabile is a 47 y.o.male seen today for follow up of the following medical problems.    1. CAD - NSTEMI 04/2018, cath showed occluded LCX. Received DES. LVgram 35-45% - 04/2018 echo LVEF 50-55%, no WMAs, grade I diastolic dysfunction - ASA allergy, had been on brillinta alone. We tried stopping coreg due to fatigue and erectile dysfunction but no improvement in symptoms. No ACE/ARB/ARNI/aldactone due to poor renal function  - since last visit had CVA, he was changed from effient to plavix as there is contraindication for effient and CVA - during 08/2018 admission with CVA echo showed LVEF back down to 35-40%.  - no recent chest pain. Genralized fatigue since stroke.   2. HTN - compliant with meds  3. Hyperlipidemia - compliant with statin.    4. CKD 3   5. CVA - admit 08/2018 with left cerebellar stroke.  - no arrhtyhmia detected, TEE benign. 30 day monitor was asked to be arranged today - ongoing left sided weakness.    SH: works at [employer].   Past Medical History:  Diagnosis Date  . CAD (coronary artery disease)    a. s/p NSTEMI in 04/2018 with DES to LCx.   [name] Hypertension   . Myocardial infarction (HCC)   . Pneumonia      Allergies  Allergen Reactions  . Aspirin     Swelling      Current Outpatient Medications  Medication Sig Dispense Refill  . acetaminophen (TYLENOL) 325 MG tablet Take 650 mg by mouth every 6 (six) hours as needed for mild pain.    [name] amLODipine (NORVASC) 10 MG tablet Take 1 tablet (10 mg total) by mouth daily. 90 tablet 3  . atorvastatin (LIPITOR) 80 MG tablet Take 1 tablet (80 mg total) by mouth daily at 6 PM. 90 tablet 3  . clopidogrel (PLAVIX) 75 MG tablet Take 1 tablet (75 mg total) by mouth daily. 30 tablet 0  . HYDROcodone-acetaminophen (NORCO/VICODIN) 5-325 MG tablet 1 or 2 tabs PO q6 hours prn pain 15 tablet 0  . isosorbide-hydrALAZINE (BIDIL) 20-37.5 MG tablet Take 1 tablet by  mouth 3 (three) times daily. 270 tablet 3   No current facility-administered medications for this visit.      Past Surgical History:  Procedure Laterality Date  . CORONARY STENT INTERVENTION N/A 05/05/2018   Procedure: CORONARY STENT INTERVENTION;  Surgeon: Marykay Lex, MD;  Location: [facility] INVASIVE CV LAB;  Service: Cardiovascular;  Laterality: N/A;  . LEFT HEART CATH AND CORONARY ANGIOGRAPHY N/A 05/05/2018   Procedure: LEFT HEART CATH AND CORONARY ANGIOGRAPHY;  Surgeon: Marykay Lex, MD;  Location: [facility] INVASIVE CV LAB;  Service: Cardiovascular;  Laterality: N/A;  . TEE WITHOUT CARDIOVERSION N/A 09/08/2018   Procedure: TRANSESOPHAGEAL ECHOCARDIOGRAM (TEE);  Surgeon: Laurey Morale, MD;  Location: [facility] ENDOSCOPY;  Service: Cardiovascular;  Laterality: N/A;     Allergies  Allergen Reactions  . Aspirin     Swelling       Family History  Problem Relation Age of Onset  . Hypertension Mother   . Cancer Mother   . Hypertension Father      Social History Mr. Vanvoorhis reports that he has been smoking cigarettes. He has been smoking about 0.50 packs per day. He has never used smokeless tobacco. Mr. Gunawan reports that he drinks alcohol.   Review of Systems CONSTITUTIONAL: No weight loss, fever, chills, weakness or fatigue.  HEENT: Eyes: No visual loss, blurred vision, double  vision or yellow sclerae.No hearing loss, sneezing, congestion, runny nose or sore throat.  SKIN: No rash or itching.  CARDIOVASCULAR: per hpi RESPIRATORY: No shortness of breath, cough or sputum.  GASTROINTESTINAL: No anorexia, nausea, vomiting or diarrhea. No abdominal pain or blood.  GENITOURINARY: No burning on urination, no polyuria NEUROLOGICAL: left sided weakness.  MUSCULOSKELETAL: No muscle, back pain, joint pain or stiffness.  LYMPHATICS: No enlarged nodes. No history of splenectomy.  PSYCHIATRIC: No history of depression or anxiety.  ENDOCRINOLOGIC: No reports of sweating, cold or heat  intolerance. No polyuria or polydipsia.  [name]   Physical Examination Vitals:   09/23/18 1324  BP: (!) 154/76  Pulse: 86  SpO2: 98%   Vitals:   09/23/18 1324  Weight: 289 lb (131.1 kg)  Height: 6' (1.829 m)    Gen: resting comfortably, no acute distress HEENT: no scleral icterus, pupils equal round and reactive, no palptable cervical adenopathy,  CV: RRR, no m/r/g, no jvd Resp: Clear to auscultation bilaterally GI: abdomen is soft, non-tender, non-distended, normal bowel sounds, no hepatosplenomegaly MSK: extremities are warm, no edema.  Skin: warm, no rash Neuro:  no focal deficits Psych: appropriate affect   Diagnostic Studies 04/2018 cath  Mid Cx to Dist Cx lesion is 100% stenosed.  A drug-eluting stent was successfully placed using a STENT SYNERGY DES 3X24. -Postdilated to 3.3 mm  Post intervention, there is a 0% residual stenosis.  LAV Groove lesion is 65% stenosed - In a small branch beyond the stent.  There is moderate left ventricular systolic dysfunction. The left ventricular ejection fraction is 35-45% by visual estimate.  Severe single-vessel disease with occlusion of the distal Cx treated with single DES stent. Moderately reduced LVEF with moderately elevated LVEDP.   POST CATH PLAN Plan: Admit to CCU. Continue IV nitroglycerin overnight.  With aspirin allergy, have changed from Plavix to Brilinta for antiplatelet agent.  Elevated LVEDP, but CKD-3, will hydrate and give 1 dose of IV Lasix. -Consider diuretic for part of antihypertensive therapy (consider chlorthalidone).  Aggressive blood pressure control, have converted from metoprolol to carvedilol along with amlodipine.-Hold off on ARB/ACE inhibitor until renal function stable post cath  High-dose statin   Check 2D echocardiogram to better assess EF   04/2018 echo Study Conclusions  - Left ventricle: The cavity size was normal. There was moderate concentric hypertrophy. Systolic  function was normal. The estimated ejection fraction was in the range of 50% to 55%. Wall motion was normal; there were no regional wall motion abnormalities. Doppler parameters are consistent with abnormal left ventricular relaxation (grade 1 diastolic dysfunction). - Pericardium, extracardiac: A trivial pericardial effusion was identified.    Assessment and Plan  1. CAD/ICM/Chronic systolic heart failure - recent stenting as reported above.  - labile LVEF by echo, by most recent study back down to 35-40% - we had stopped coreg due to reported fatigue and erectile dysfunction and at the time his LVEF had normalized. Off coreg symptoms not improved, LVEF has decreased again - restart coreg 3.125mg  bid. With high bp's stop bidil, start hydral 50mg  tid and imdur 30mg  daily. No ACE/ARB/ARN/aldactone due to poor renal function.   2. Hyperlipidemia - he will continue statin  3. HTN - above goal - restart coreg 3.125mg  bid, stop bidil and start hydralazine 50mg  tid and imdur 30  4. CKD 3 - limit nephrotoxic drugs  5. CVA - we will obtain 30 day event monitor per neuro request. He does report symptoms of palpitations as well, follow  up monitor results.   Patient is to provider work absence papers. Certaintly with his MI in 04/2018 and stroke 08/2018 he will require leave from work to recover.    Antoine Poche, M.D.

## 2018-09-30 ENCOUNTER — Ambulatory Visit (INDEPENDENT_AMBULATORY_CARE_PROVIDER_SITE_OTHER): Payer: BLUE CROSS/BLUE SHIELD

## 2018-09-30 DIAGNOSIS — R002 Palpitations: Secondary | ICD-10-CM | POA: Diagnosis not present

## 2018-10-06 ENCOUNTER — Ambulatory Visit: Payer: BLUE CROSS/BLUE SHIELD

## 2018-10-08 ENCOUNTER — Telehealth: Payer: Self-pay | Admitting: Cardiology

## 2018-10-08 DIAGNOSIS — E785 Hyperlipidemia, unspecified: Secondary | ICD-10-CM | POA: Diagnosis not present

## 2018-10-08 DIAGNOSIS — I69952 Hemiplegia and hemiparesis following unspecified cerebrovascular disease affecting left dominant side: Secondary | ICD-10-CM | POA: Diagnosis not present

## 2018-10-08 DIAGNOSIS — E559 Vitamin D deficiency, unspecified: Secondary | ICD-10-CM | POA: Diagnosis not present

## 2018-10-08 DIAGNOSIS — R070 Pain in throat: Secondary | ICD-10-CM | POA: Diagnosis not present

## 2018-10-08 DIAGNOSIS — I10 Essential (primary) hypertension: Secondary | ICD-10-CM | POA: Diagnosis not present

## 2018-10-08 DIAGNOSIS — I2510 Atherosclerotic heart disease of native coronary artery without angina pectoris: Secondary | ICD-10-CM | POA: Diagnosis not present

## 2018-10-08 DIAGNOSIS — R5383 Other fatigue: Secondary | ICD-10-CM | POA: Diagnosis not present

## 2018-10-08 DIAGNOSIS — I6990 Unspecified sequelae of unspecified cerebrovascular disease: Secondary | ICD-10-CM | POA: Diagnosis not present

## 2018-10-08 NOTE — Telephone Encounter (Signed)
Zachary Prows, NP w/ Dr. Marian Sorrow called requesting to speak w/ Dr. Wyline Mood concerning the pt's medications. Please give her a call @ [phone]

## 2018-10-09 NOTE — Telephone Encounter (Signed)
Can we contact them and find out exactly what questions she has and forward through epic.    Dominga Ferry MD

## 2018-10-09 NOTE — Telephone Encounter (Signed)
Will forward to Dr. Branch. 

## 2018-10-10 ENCOUNTER — Other Ambulatory Visit: Payer: Self-pay

## 2018-10-10 NOTE — Patient Outreach (Signed)
[facility]) Care Management  10/10/2018  Zachary Mccann July 02, 1971 [id number]  EMMI:stroke follow up Referral date:09/11/18 Referral reason:Feeling worse overall: yes Day #3  Telephone call to patient regarding EMMI stroke follow up . HIPAA verified with patient. Patient states he saw his primary MD on on 10/08/18.  He states his blood pressure is lower now.  Patient states he had lab work done at his appointment.  Patient reports his lab work came back showing his kidney's are poor.  Patient reports he is scheduled to follow up with a urologist.  Patient states he has a follow up with the cardiologist next week and sees his primary MD again on 11/05/18.   Patient states he has transportation to his appointments.   Patient denies any further needs or concerns at this time.  RNCM advised patient to notify MD of any changes in condition prior to scheduled appointment. RNCM verified patient aware of 911 services for urgent/ emergent needs.  PLAN; RNCM will close patient due to patient being assessed and having no further needs.   George Ina RN,BSN,CCM [facility] Telephonic  [phone]

## 2018-10-14 ENCOUNTER — Telehealth: Payer: Self-pay

## 2018-10-14 NOTE — Telephone Encounter (Signed)
FYI: Pt states he threw Effient in trash, is not taking

## 2018-10-14 NOTE — Telephone Encounter (Signed)
Zachary Poche, MD  [name], RN  Cc: Nori Riis, RN        Absolutely, he is to be off the effient and only on plavix. Thanks for verifying that with him, I will have our office double verify. Lynden Ang can we make sure this patient is off his effient and only on plavix   J BrancH MD       1634 hrs: LMTCB-cc

## 2018-11-05 ENCOUNTER — Ambulatory Visit: Payer: BLUE CROSS/BLUE SHIELD | Admitting: Student

## 2018-11-05 NOTE — Progress Notes (Deleted)
Cardiology Office Note    Date:  11/05/2018   ID:  Zachary Mccann, DOB [birth year], MRN [id number]  PCP:  Wilson Singer, MD  Cardiologist: Dina Rich, MD    No chief complaint on file.   History of Present Illness:    Zachary Mccann is a 47 y.o. male with past medical history of CAD (s/p NSTEMI in 04/2018 with DES to LCx), ischemic cardiomyopathy, HTN, HLD, Stage 3 CKD and recent CVA in 08/2018 who presents to the office today for 6-week follow-up.   He was last examined by Dr. Wyline Mood in 09/2018 and had been admitted for a CVA in 08/2018.  He denied any recent chest discomfort but reported having generalized fatigue since his recent stroke.  He had been changed previously from Brilinta to Effient due to dyspnea but was later changed from Effient to Plavix following his CVA as Effient is contraindicated in the setting of a recent CVA. Carvedilol had been discontinued due to fatigue and erectile dysfunction but his EF was further reduced off of this, therefore Coreg was restarted at 3.125 mg twice daily and he was started on Hydralazine 50 mg TID and Imdur 30 mg daily in place of BiDil. A 30-day monitor was ordered but has not yet resulted.     Past Medical History:  Diagnosis Date  . CAD (coronary artery disease)    a. s/p NSTEMI in 04/2018 with DES to LCx.   [name] Hypertension   . Myocardial infarction (HCC)   . Pneumonia     Past Surgical History:  Procedure Laterality Date  . CORONARY STENT INTERVENTION N/A 05/05/2018   Procedure: CORONARY STENT INTERVENTION;  Surgeon: Marykay Lex, MD;  Location: [facility] INVASIVE CV LAB;  Service: Cardiovascular;  Laterality: N/A;  . LEFT HEART CATH AND CORONARY ANGIOGRAPHY N/A 05/05/2018   Procedure: LEFT HEART CATH AND CORONARY ANGIOGRAPHY;  Surgeon: Marykay Lex, MD;  Location: [facility] INVASIVE CV LAB;  Service: Cardiovascular;  Laterality: N/A;  . TEE WITHOUT CARDIOVERSION N/A 09/08/2018   Procedure: TRANSESOPHAGEAL ECHOCARDIOGRAM  (TEE);  Surgeon: Laurey Morale, MD;  Location: [facility] ENDOSCOPY;  Service: Cardiovascular;  Laterality: N/A;    Current Medications: Outpatient Medications Prior to Visit  Medication Sig Dispense Refill  . acetaminophen (TYLENOL) 325 MG tablet Take 650 mg by mouth every 6 (six) hours as needed for mild pain.    [name] amLODipine (NORVASC) 10 MG tablet Take 1 tablet (10 mg total) by mouth daily. 90 tablet 3  . atorvastatin (LIPITOR) 80 MG tablet Take 1 tablet (80 mg total) by mouth daily at 6 PM. 90 tablet 3  . carvedilol (COREG) 3.125 MG tablet Take 1 tablet (3.125 mg total) by mouth 2 (two) times daily. 180 tablet 3  . clopidogrel (PLAVIX) 75 MG tablet Take 1 tablet (75 mg total) by mouth daily. 30 tablet 0  . hydrALAZINE (APRESOLINE) 50 MG tablet Take 1 tablet (50 mg total) by mouth 3 (three) times daily. 270 tablet 3  . isosorbide mononitrate (IMDUR) 30 MG 24 hr tablet Take 1 tablet (30 mg total) by mouth daily. 90 tablet 3   No facility-administered medications prior to visit.      Allergies:   Aspirin   Social History   Socioeconomic History  . Marital status: Single    Spouse name: Not on file  . Number of children: Not on file  . Years of education: Not on file  . Highest education level: Not on file  Occupational History  .  Not on file  Social Needs  . Financial resource strain: Not on file  . Food insecurity:    Worry: Not on file    Inability: Not on file  . Transportation needs:    Medical: Not on file    Non-medical: Not on file  Tobacco Use  . Smoking status: Current Every Day Smoker    Packs/day: 0.50    Types: Cigarettes  . Smokeless tobacco: Never Used  . Tobacco comment: smoked for 10-20 years at 1/2 ppd as of 2019  Substance and Sexual Activity  . Alcohol use: Yes    Comment: occ  . Drug use: Yes    Types: Marijuana    Comment: occ  . Sexual activity: Yes  Lifestyle  . Physical activity:    Days per week: Not on file    Minutes per session: Not on file   . Stress: Not on file  Relationships  . Social connections:    Talks on phone: Not on file    Gets together: Not on file    Attends religious service: Not on file    Active member of club or organization: Not on file    Attends meetings of clubs or organizations: Not on file    Relationship status: Not on file  Other Topics Concern  . Not on file  Social History Narrative  . Not on file     Family History:  The patient's ***family history includes Cancer in his mother; Hypertension in his father and mother.   Review of Systems:   Please see the history of present illness.     General:  No chills, fever, night sweats or weight changes.  Cardiovascular:  No chest pain, dyspnea on exertion, edema, orthopnea, palpitations, paroxysmal nocturnal dyspnea. Dermatological: No rash, lesions/masses Respiratory: No cough, dyspnea Urologic: No hematuria, dysuria Abdominal:   No nausea, vomiting, diarrhea, bright red blood per rectum, melena, or hematemesis Neurologic:  No visual changes, wkns, changes in mental status. All other systems reviewed and are otherwise negative except as noted above.   Physical Exam:    VS:  There were no vitals taken for this visit.   General: Well developed, well nourished,male appearing in no acute distress. Head: Normocephalic, atraumatic, sclera non-icteric, no xanthomas, nares are without discharge.  Neck: No carotid bruits. JVD not elevated.  Lungs: Respirations regular and unlabored, without wheezes or rales.  Heart: ***Regular rate and rhythm. No S3 or S4.  No murmur, no rubs, or gallops appreciated. Abdomen: Soft, non-tender, non-distended with normoactive bowel sounds. No hepatomegaly. No rebound/guarding. No obvious abdominal masses. Msk:  Strength and tone appear normal for age. No joint deformities or effusions. Extremities: No clubbing or cyanosis. No edema.  Distal pedal pulses are 2+ bilaterally. Neuro: Alert and oriented X 3. Moves all  extremities spontaneously. No focal deficits noted. Psych:  Responds to questions appropriately with a normal affect. Skin: No rashes or lesions noted  Wt Readings from Last 3 Encounters:  09/23/18 289 lb (131.1 kg)  09/06/18 281 lb (127.5 kg)  09/06/18 281 lb (127.5 kg)        Studies/Labs Reviewed:   EKG:  EKG is*** ordered today.  The ekg ordered today demonstrates ***  Recent Labs: 09/06/2018: ALT 32 09/08/2018: BUN 18; Creatinine, Ser 1.82; Hemoglobin 13.9; Platelets 187; Potassium 3.8; Sodium 137; TSH 1.812   Lipid Panel    Component Value Date/Time   CHOL 128 09/08/2018 0646   TRIG 58 09/08/2018 0646   HDL  35 (L) 09/08/2018 0646   CHOLHDL 3.7 09/08/2018 0646   VLDL 12 09/08/2018 0646   LDLCALC 81 09/08/2018 0646    Additional studies/ records that were reviewed today include:   Limited Echo: 08/2018 Study Conclusions  - Left ventricle: Wall thickness was increased in a pattern of   moderate LVH. Systolic function was moderately reduced. The   estimated ejection fraction was in the range of 35% to 40%.   Severe hypokinesis of the mid-apicalinferior myocardium.  Cardiac Catheterization: 04/2018  Mid Cx to Dist Cx lesion is 100% stenosed.  A drug-eluting stent was successfully placed using a STENT SYNERGY DES 3X24. -Postdilated to 3.3 mm  Post intervention, there is a 0% residual stenosis.  LAV Groove lesion is 65% stenosed - In a small branch beyond the stent.  There is moderate left ventricular systolic dysfunction. The left ventricular ejection fraction is 35-45% by visual estimate.   Severe single-vessel disease with occlusion of the distal Cx  treated with single DES stent. Moderately reduced LVEF with moderately elevated LVEDP.     Plan: Admit to CCU.  Continue IV nitroglycerin overnight.  With aspirin allergy, have changed from Plavix to Brilinta for antiplatelet agent.  Elevated LVEDP, but CKD-3, will hydrate and give 1 dose of IV Lasix.  -Consider diuretic for part of antihypertensive therapy (consider chlorthalidone).  Aggressive blood pressure control, have converted from metoprolol to carvedilol along with amlodipine.-Hold off on ARB/ACE inhibitor until renal function stable post cath  High-dose statin   Check 2D echocardiogram to better assess EF   Assessment:    No diagnosis found.   Plan:   In order of problems listed above:  1. ***    Medication Adjustments/Labs and Tests Ordered: Current medicines are reviewed at length with the patient today.  Concerns regarding medicines are outlined above.  Medication changes, Labs and Tests ordered today are listed in the Patient Instructions below. There are no Patient Instructions on file for this visit.   Signed, Ellsworth Lennox, PA-C  11/05/2018 11:24 AM    [facility] [address] [place], [place] [address] Phone: [phone]

## 2018-11-20 ENCOUNTER — Encounter: Payer: Self-pay | Admitting: Cardiology

## 2018-11-20 ENCOUNTER — Ambulatory Visit (INDEPENDENT_AMBULATORY_CARE_PROVIDER_SITE_OTHER): Payer: BLUE CROSS/BLUE SHIELD | Admitting: Cardiology

## 2018-11-20 VITALS — BP 136/92 | HR 81 | Ht 72.0 in | Wt 304.8 lb

## 2018-11-20 DIAGNOSIS — I10 Essential (primary) hypertension: Secondary | ICD-10-CM

## 2018-11-20 DIAGNOSIS — N183 Chronic kidney disease, stage 3 unspecified: Secondary | ICD-10-CM

## 2018-11-20 DIAGNOSIS — I428 Other cardiomyopathies: Secondary | ICD-10-CM

## 2018-11-20 DIAGNOSIS — Z8673 Personal history of transient ischemic attack (TIA), and cerebral infarction without residual deficits: Secondary | ICD-10-CM

## 2018-11-20 DIAGNOSIS — I252 Old myocardial infarction: Secondary | ICD-10-CM | POA: Diagnosis not present

## 2018-11-20 DIAGNOSIS — I429 Cardiomyopathy, unspecified: Secondary | ICD-10-CM | POA: Insufficient documentation

## 2018-11-20 NOTE — Progress Notes (Signed)
11/20/2018 Zachary Mccann   02/22/1971  [id number]  Primary Physician Wilson Singer, MD Primary Cardiologist: Dr Wyline Mood  HPI: Zachary Mccann is a pleasant 47 year old overweight African-American male who is followed by Dr. Wyline Mood.  He is from [place]. He was actually scheduled to see [place] in [place] but was late for his appointment and he was rescheduled and given an appointment with me here in [place] today.    He had a non-ST elevation in May 2019.  Catheterization revealed occlusion of his circumflex and he underwent intervention with a drug-eluting stent.  His initial ejection fraction was 35 to 45%.  This improved on a follow-up study to 50 to 55%.  Other medical problems include hypertension and chronic renal insufficiency and aspirin allergy.  Patient was maintained on Effient but in September 2019 he had a stroke.  He has some residual left-sided weakness.  Echocardiogram in September showed a drop in his EF now down to to 35%.  He is unable to take an ACE inhibitor or an ARB because of his chronic renal insufficiency.  He has had some problems with erectile dysfunction, carvedilol was stopped in the past but he had no improvement so it was resumed.  He asked me today whether or not he would be a candidate for Viagra as he is starting to have issues between his fiance and himself about this.  From a cardiac standpoint otherwise he is doing well.  He walks with a cane.  He denies any chest pain or tachycardia or unusual dyspnea.  He forgot to bring his medications with him today in his best we can tell he is on the medications listed below including hydralazine and Imdur.     Current Outpatient Medications  Medication Sig Dispense Refill  . atorvastatin (LIPITOR) 80 MG tablet Take 1 tablet (80 mg total) by mouth daily at 6 PM. 90 tablet 3  . carvedilol (COREG) 3.125 MG tablet Take 1 tablet (3.125 mg total) by mouth 2 (two) times daily. 180 tablet 3  . hydrALAZINE  (APRESOLINE) 50 MG tablet Take 1 tablet (50 mg total) by mouth 3 (three) times daily. 270 tablet 3  . acetaminophen (TYLENOL) 325 MG tablet Take 650 mg by mouth every 6 (six) hours as needed for mild pain.    [name] amLODipine (NORVASC) 10 MG tablet Take 1 tablet (10 mg total) by mouth daily. 90 tablet 3  . clopidogrel (PLAVIX) 75 MG tablet Take 1 tablet (75 mg total) by mouth daily. 30 tablet 0  . isosorbide mononitrate (IMDUR) 30 MG 24 hr tablet Take 1 tablet (30 mg total) by mouth daily. 90 tablet 3   No current facility-administered medications for this visit.     Allergies  Allergen Reactions  . Aspirin     Swelling     Past Medical History:  Diagnosis Date  . CAD (coronary artery disease)    a. s/p NSTEMI in 04/2018 with DES to LCx.   [name] Hypertension   . Myocardial infarction (HCC)   . Pneumonia     Social History   Socioeconomic History  . Marital status: Single    Spouse name: Not on file  . Number of children: Not on file  . Years of education: Not on file  . Highest education level: Not on file  Occupational History  . Not on file  Social Needs  . Financial resource strain: Not on file  . Food insecurity:    Worry: Not on file  Inability: Not on file  . Transportation needs:    Medical: Not on file    Non-medical: Not on file  Tobacco Use  . Smoking status: Current Every Day Smoker    Packs/day: 0.50    Types: Cigarettes  . Smokeless tobacco: Never Used  . Tobacco comment: smoked for 10-20 years at 1/2 ppd as of 2019  Substance and Sexual Activity  . Alcohol use: Yes    Comment: occ  . Drug use: Yes    Types: Marijuana    Comment: occ  . Sexual activity: Yes  Lifestyle  . Physical activity:    Days per week: Not on file    Minutes per session: Not on file  . Stress: Not on file  Relationships  . Social connections:    Talks on phone: Not on file    Gets together: Not on file    Attends religious service: Not on file    Active member of club or  organization: Not on file    Attends meetings of clubs or organizations: Not on file    Relationship status: Not on file  . Intimate partner violence:    Fear of current or ex partner: Not on file    Emotionally abused: Not on file    Physically abused: Not on file    Forced sexual activity: Not on file  Other Topics Concern  . Not on file  Social History Narrative  . Not on file     Family History  Problem Relation Age of Onset  . Hypertension Mother   . Cancer Mother   . Hypertension Father      Review of Systems: General: negative for chills, fever, night sweats or weight changes.  Cardiovascular: negative for chest pain, dyspnea on exertion, edema, orthopnea, palpitations, paroxysmal nocturnal dyspnea or shortness of breath Dermatological: negative for rash Respiratory: negative for cough or wheezing Urologic: negative for hematuria Abdominal: negative for nausea, vomiting, diarrhea, bright red blood per rectum, melena, or hematemesis Neurologic: negative for visual changes, syncope, or dizziness All other systems reviewed and are otherwise negative except as noted above.    Blood pressure (!) 136/92, pulse 81, height 6' (1.829 m), weight (!) 304 lb 12.8 oz (138.3 kg).  General appearance: alert, cooperative, no distress and moderately obese Neck: no carotid bruit and no JVD Lungs: clear to auscultation bilaterally Heart: regular rate and rhythm Extremities: no edema Skin: warm and dry Neurologic: Grossly normal   ASSESSMENT AND PLAN:     NSTEMI 04/2018, cath showed occluded LCX. Received DES. LVgram 35-45% - 04/2018 echo LVEF 50-55%, no WMAs, grade I diastolic dysfunction - ASA allergy, had been on brillinta alone. We tried stopping coreg due to fatigue and erectile dysfunction but no improvement in symptoms. No ACE/ARB/ARNI/aldactone due to poor renal function    Ischemic cardiomyopathy - during 08/2018 admission with CVA echo showed LVEF back down to  35-40%-previously 50% - no recent chest pain. Genralized fatigue since stroke.     HTN - compliant with meds    Hyperlipidemia - compliant with statin.    CKD 3 - no ACE or ARB    History of CVA  - admit 08/2018 with left cerebellar stroke.  - no arrhtyhmia detected on OP 30 day Holter monitor. TEE benign.  - ongoing left sided weakness.     ED -  Post CVA. No change off Coreg in the past and not a candidate for Viagra secondary to concurrent BiDil use  PLAN  Will ask Dr Wyline Mood about options for ED. F/U in 3 months in [place], check labs then.   Corine Shelter PA-C 11/20/2018 4:01 PM

## 2018-11-20 NOTE — Patient Instructions (Addendum)
Medication Instructions:   Your physician recommends that you continue on your current medications as directed. Please refer to the Current Medication list given to you today.   If you need a refill on your cardiac medications before your next appointment, please call your pharmacy.   Lab work:  NONE ORDERED  TODAY   If you have labs (blood work) drawn today and your tests are completely normal, you will receive your results only by: [name] MyChart Message (if you have MyChart) OR . A paper copy in the mail If you have any lab test that is abnormal or we need to change your treatment, we will call you to review the results.  Testing/Procedures: NONE ORDERED  TODAY   Follow-Up:   IN MARCH WITH DR [facility]      Any Other Special Instructions Will Be Listed Below (If Applicable).

## 2018-11-21 ENCOUNTER — Telehealth: Payer: Self-pay

## 2018-11-21 NOTE — Telephone Encounter (Signed)
-----   Message from Albertine Patricia, CMA sent at 11/20/2018  4:46 PM EST -----   ----- Message ----- From: Antoine Poche, MD Sent: 11/20/2018   4:38 PM EST To: Albertine Patricia, CMA  Overall benign heart monitor   Dominga Ferry MD

## 2018-11-21 NOTE — Telephone Encounter (Signed)
Called pt. He was not at home, left message for pt to return call.

## 2018-11-25 NOTE — Telephone Encounter (Signed)
Returned pt call. He was confused after he heard voicemail, but I reminded him I spoke with him on 12/6 concerning his event monitor. Pt was making sure it was not another reason for call.

## 2018-11-25 NOTE — Telephone Encounter (Signed)
Follow up    Per after hours message patient is returning call.

## 2018-12-02 ENCOUNTER — Institutional Professional Consult (permissible substitution): Payer: Self-pay | Admitting: Neurology

## 2019-01-15 ENCOUNTER — Encounter: Payer: Self-pay | Admitting: Neurology

## 2019-01-19 ENCOUNTER — Ambulatory Visit: Payer: BLUE CROSS/BLUE SHIELD | Admitting: Neurology

## 2019-01-19 ENCOUNTER — Encounter: Payer: Self-pay | Admitting: Neurology

## 2019-01-19 VITALS — BP 132/84 | HR 81 | Ht 72.0 in | Wt 323.0 lb

## 2019-01-19 DIAGNOSIS — J157 Pneumonia due to Mycoplasma pneumoniae: Secondary | ICD-10-CM | POA: Diagnosis not present

## 2019-01-19 DIAGNOSIS — H532 Diplopia: Secondary | ICD-10-CM | POA: Diagnosis not present

## 2019-01-19 DIAGNOSIS — N183 Chronic kidney disease, stage 3 unspecified: Secondary | ICD-10-CM

## 2019-01-19 DIAGNOSIS — I252 Old myocardial infarction: Secondary | ICD-10-CM

## 2019-01-19 DIAGNOSIS — Z720 Tobacco use: Secondary | ICD-10-CM

## 2019-01-19 DIAGNOSIS — I63532 Cerebral infarction due to unspecified occlusion or stenosis of left posterior cerebral artery: Secondary | ICD-10-CM

## 2019-01-19 DIAGNOSIS — I428 Other cardiomyopathies: Secondary | ICD-10-CM

## 2019-01-19 DIAGNOSIS — Z8673 Personal history of transient ischemic attack (TIA), and cerebral infarction without residual deficits: Secondary | ICD-10-CM | POA: Diagnosis not present

## 2019-01-19 NOTE — Progress Notes (Signed)
SLEEP MEDICINE CLINIC   Provider:  Melvyn Novasarmen  [place], [place]  Primary Care Physician:  Wilson SingerGosrani, Nimish C, MD   Referring Provider: Wilson SingerGosrani, Nimish C, MD    Chief Complaint  Patient presents with  . New Patient (Initial Visit)    pt alone, rm 10. pt recently had a stroke in sept. and PCP wanted him to rule out sleep apnea to make sure not put at risk for further strokes. pt has been told he stops breathing in sleep and snores in sleep. pt never had a sleep study completed    HPI:  Zachary SkeensShawn T Mccann is a 48 y.o. male , seen here on 01-19-2019 in a referral   from Dr. Karilyn CotaGosrani for diplopia and suspected OSA.  Chief complaint according to patient :" The double vision is in my left side " and:  " I snore ".   At the pleasure of speaking with [name] today a 48 year old African-American  left-handed gentleman, who presents for a sleep apnea evaluation. He has extensive medical problems,and neurological deficits related to a stroke in September 2019. He had a medical myocardial infarction in July 2019 and had a cardiac stent placed. He uses a cane now, and is short of breath when walking to the mailbox and back.   Sleep habits are as follows: he goes to bed around midnight. The bedroom is cool, quiet and dark. He sleeps on 2 pillows. On a flat bed.  He wakes within 3-4 hours to go to bathroom. He dreams of needing to go to the bathroom. He can't go back to sleep many nights- " that's it". Other nights he will be sleeping another 2-3 hours-Sometimes he gasps or snores . Wakes up at 6.30 final rises and helps with breakfast. His stepchildren get ready for school. He may go back to bed, but doesn't sleep. He may nap after lunch. He falls asleep easily.    Medical history:[name] is a 48 y.o.male with the following diagnosis/ medical problems:  1. CAD- NSTEMI 04/2018, cath showed occluded LCX. Received DES. LVgram 35-45%- 04/2018 echo LVEF 50-55%, no WMAs, grade I diastolic dysfunction -  ASA allergy, had been on brillinta alone. We tried stopping coreg due to fatigue and erectile dysfunction but no improvement in symptoms. No ACE/ARB/ARNI/aldactone due to poor renal function - since last visit had CVA, he was changed from effient to Plavix as there is contraindication for effient and CVA - during 08/2018 admission with CVA echo showed LVEF back down to 35-40%.  - no recent chest pain. Genralized fatigue since stroke.  2. HTN- compliant with meds 3. Hyperlipidemia- compliant with statin.  4. CKD 3 5. CVA - admit 08/2018 with left cerebellar stroke.  - no arrhtyhmia detected, TEE benign. 30 day monitor was asked to be arranged today - ongoing left sided weakness.       Family sleep history: father had CVA, mother had heart disease, HTN, and Maternal GM died of CAD.   Social history: lives with his fiancee, she has 6 kids and they call him daddy.  He smokes- 5 cigarettes a day, cut down form 2 ppd.  Currently disabled- Community education officerautomotive part maker, Animal nutritionistrubber parts/SH: works at [employer].   Alcohol use; 2-3 drinks on holidays. Caffeine- Mountain dew, Dr. Reino KentPepper, Cheerwine and sweet tea.  No coffee/  Review of Systems: Out of a complete 14 system review, the patient complains of only the following symptoms, and all other reviewed systems are negative. He  reports diplopia with gaze to the left, coughing, often being short of breath, having swelling in the legs, some memory loss, walking difficulties with balance loss, neck stiffness depression anxiety and speech difficulties.      Epworth score 10/ 24 ,  Fatigue severity score 45/63  ,  depression score n/a    Social History   Socioeconomic History  . Marital status: Single    Spouse name: Not on file  . Number of children: Not on file  . Years of education: Not on file  . Highest education level: Not on file  Occupational History  . Not on file  Social Needs  . Financial resource strain: Not on file  .  Food insecurity:    Worry: Not on file    Inability: Not on file  . Transportation needs:    Medical: Not on file    Non-medical: Not on file  Tobacco Use  . Smoking status: Current Every Day Smoker    Packs/day: 0.50    Types: Cigarettes  . Smokeless tobacco: Never Used  . Tobacco comment: smoked for 10-20 years at 1/2 ppd as of 2019  Substance and Sexual Activity  . Alcohol use: Yes    Comment: occ  . Drug use: Yes    Types: Marijuana    Comment: occ  . Sexual activity: Yes  Lifestyle  . Physical activity:    Days per week: Not on file    Minutes per session: Not on file  . Stress: Not on file  Relationships  . Social connections:    Talks on phone: Not on file    Gets together: Not on file    Attends religious service: Not on file    Active member of club or organization: Not on file    Attends meetings of clubs or organizations: Not on file    Relationship status: Not on file  . Intimate partner violence:    Fear of current or ex partner: Not on file    Emotionally abused: Not on file    Physically abused: Not on file    Forced sexual activity: Not on file  Other Topics Concern  . Not on file  Social History Narrative  . Not on file    Family History  Problem Relation Age of Onset  . Hypertension Mother   . Stroke Mother   . Dementia Mother   . Hypertension Father   . Stroke Father   . Cancer Father   . Alcoholism Father   . Cancer Sister     Past Medical History:  Diagnosis Date  . CAD (coronary artery disease)    a. s/p NSTEMI in 04/2018 with DES to LCx.   [name] Hypertension   . Myocardial infarction (HCC)   . Pneumonia   . Stroke [facility])     Past Surgical History:  Procedure Laterality Date  . CORONARY STENT INTERVENTION N/A 05/05/2018   Procedure: CORONARY STENT INTERVENTION;  Surgeon: Marykay Lex, MD;  Location: [facility] INVASIVE CV LAB;  Service: Cardiovascular;  Laterality: N/A;  . LEFT HEART CATH AND CORONARY ANGIOGRAPHY N/A 05/05/2018    Procedure: LEFT HEART CATH AND CORONARY ANGIOGRAPHY;  Surgeon: Marykay Lex, MD;  Location: [facility] INVASIVE CV LAB;  Service: Cardiovascular;  Laterality: N/A;  . TEE WITHOUT CARDIOVERSION N/A 09/08/2018   Procedure: TRANSESOPHAGEAL ECHOCARDIOGRAM (TEE);  Surgeon: Laurey Morale, MD;  Location: [facility] ENDOSCOPY;  Service: Cardiovascular;  Laterality: N/A;    Current Outpatient Medications  Medication  Sig Dispense Refill  . amLODipine (NORVASC) 10 MG tablet Take 10 mg by mouth daily.    [name] atorvastatin (LIPITOR) 80 MG tablet Take 1 tablet (80 mg total) by mouth daily at 6 PM. 90 tablet 3  . amLODipine (NORVASC) 10 MG tablet Take 1 tablet (10 mg total) by mouth daily. 90 tablet 3  . carvedilol (COREG) 3.125 MG tablet Take 1 tablet (3.125 mg total) by mouth 2 (two) times daily. 180 tablet 3  . hydrALAZINE (APRESOLINE) 50 MG tablet Take 1 tablet (50 mg total) by mouth 3 (three) times daily. 270 tablet 3  . isosorbide mononitrate (IMDUR) 30 MG 24 hr tablet Take 1 tablet (30 mg total) by mouth daily. 90 tablet 3   No current facility-administered medications for this visit.     Allergies as of 01/19/2019 - Review Complete 01/19/2019  Allergen Reaction Noted  . Aspirin  05/05/2018    Vitals: There were no vitals taken for this visit. Last Weight:  Wt Readings from Last 1 Encounters:  11/20/18 (!) 304 lb 12.8 oz (138.3 kg)   YIR:SWNIO is no height or weight on file to calculate BMI.     Last Height:   Ht Readings from Last 1 Encounters:  11/20/18 6' (1.829 m)    Physical exam:  General: The patient is awake, alert and appears not in acute distress. The patient is well groomed. Head: Normocephalic, atraumatic. Neck is supple. Mallampati: 5 neck circumference:17" . Nasal airflow congested ,Retrognathia is seen. Very small lower jaw.   Cardiovascular:  Regular rate and rhythm , without  murmurs or carotid bruit, and without distended neck veins. Respiratory: Lungs are clear to  auscultation. Skin:  Without evidence of edema, or rash Trunk: BMI is 40. The patient's posture is erect.   Neurologic exam : The patient is awake and alert, oriented to place and time.   Memory subjective  described as impaired .  MOCA:No flowsheet data found. MMSE:No flowsheet data found.  Attention span & concentration ability appears normal.  Speech is fluent,  without dysarthria, dysphonia or aphasia.  Mood and affect are appropriate.  Cranial nerves: Reports intact sense of smell, but changes in taste- Pupils are equal and briskly reactive to light. Funduscopic exam without  evidence of pallor or edema. Extraocular movements  in vertical and horizontal planes intact and without nystagmus. Visual fields by finger perimetry are intact. Hearing to finger rub intact.   Facial sensation intact to fine touch.  Facial motor strength - ptosis on the left,  the tongue and uvula move midline. Shoulder shrug was symmetrical.   Motor exam:  Normal tone, muscle bulk and symmetric strength in all extremities.  Sensory:  Fine touch, pinprick and vibration were tested in all extremities. Proprioception tested in the upper extremities was normal.  Coordination: Rapid alternating movements in the fingers/hands was normal. Finger-to-nose maneuver  On the left with ataxia, dysmetria but not  Tremor. Pronator drift noted.   Gait and station: Patient walks without assistive device and is able unassisted to climb up to the exam table. Strength within normal limits.  Stance is stable .   Deep tendon reflexes: in the upper and lower extremities are  symmetric and intact. Babinski maneuver response is up-going on the right    Assessment:  After physical and neurologic examination, review of laboratory studies,  Personal review of imaging studies, reports of other /same  Imaging studies, results of polysomnography and / or neurophysiology testing and pre-existing records as far  as provided in visit., my  assessment is   1)since stroke 2019 he has  left dominant ataxia and dysmetria, worsened by diplopia. This speaks for a brain stem stroke.  Risk factors include smoking. CAD.  2) OSA risk factors of snoring, smoking witnessed apnea, BMI over 40 and retrognathia, macroglossia. High grade upper airway obstruction.   3) high degree of fatigue , EDS, and fatigability. Starts the day rested, but can't last - every walk, talk and activity is slowed and draining.    The patient was advised of the nature of the diagnosed disorder , the treatment options and the  risks for general health and wellness arising from not treating the condition.   I spent more than 50 minutes of face to face time with the patient. Greater than 50% of time was spent in counseling and coordination of care. We have discussed the diagnosis and differential and I answered the patient's questions.    Plan:  Treatment plan and additional workup:  Chantix may be helpful to help with smoking cessation. Dietary instructions, DASH diet. Sleep test for CVA -Diplopia stroke related,  OSA tested in attended sleep study:  upper airway affected, deviated soft palate, large tongue.    Melvyn NovasARMEN Joel Cowin, MD 01/19/2019, 3:29 PM  Certified in Neurology by ABPN Certified in Sleep Medicine by [facility]  [facility] [address] [place], [place] [id number]

## 2019-02-09 DIAGNOSIS — I10 Essential (primary) hypertension: Secondary | ICD-10-CM | POA: Diagnosis not present

## 2019-02-09 DIAGNOSIS — R809 Proteinuria, unspecified: Secondary | ICD-10-CM | POA: Diagnosis not present

## 2019-02-09 DIAGNOSIS — N183 Chronic kidney disease, stage 3 (moderate): Secondary | ICD-10-CM | POA: Diagnosis not present

## 2019-02-24 ENCOUNTER — Encounter: Payer: Self-pay | Admitting: Cardiology

## 2019-02-24 ENCOUNTER — Ambulatory Visit (INDEPENDENT_AMBULATORY_CARE_PROVIDER_SITE_OTHER): Payer: BLUE CROSS/BLUE SHIELD | Admitting: Cardiology

## 2019-02-24 VITALS — BP 140/84 | HR 93 | Ht 72.0 in | Wt 325.0 lb

## 2019-02-24 DIAGNOSIS — I2510 Atherosclerotic heart disease of native coronary artery without angina pectoris: Secondary | ICD-10-CM | POA: Diagnosis not present

## 2019-02-24 DIAGNOSIS — E782 Mixed hyperlipidemia: Secondary | ICD-10-CM

## 2019-02-24 DIAGNOSIS — I10 Essential (primary) hypertension: Secondary | ICD-10-CM | POA: Diagnosis not present

## 2019-02-24 DIAGNOSIS — I5022 Chronic systolic (congestive) heart failure: Secondary | ICD-10-CM | POA: Diagnosis not present

## 2019-02-24 MED ORDER — CARVEDILOL 6.25 MG PO TABS: 6.2500 mg | ORAL_TABLET | Freq: Two times a day (BID) | ORAL | 3 refills | Status: DC

## 2019-02-24 MED ORDER — CLOPIDOGREL BISULFATE 75 MG PO TABS: 75.0000 mg | ORAL_TABLET | Freq: Every day | ORAL | 3 refills | Status: DC

## 2019-02-24 NOTE — Progress Notes (Signed)
Clinical Summary [name] is a 48 y.o.male seen today for follow up of the following medical problems.   1. CAD/ICM  - NSTEMI 04/2018, cath showed occluded LCX. Received DES. LVgram 35-45% - 04/2018 echo LVEF 50-55%, no WMAs, grade I diastolic dysfunction - ASA allergy, had been on brillinta alone. We tried stopping coreg due to fatigue and erectile dysfunction but no improvement in symptoms. No ACE/ARB/ARNI/aldactone due to poor renal function  - since last visit had CVA, he was changed from effient to plavix as there is contraindication for effient and CVA - during 08/2018 admission with CVA echo showed LVEF back down to 35-40%.  - no recent chest pain. Genralized fatigue since stroke.    - no recent chest pain. Chronic SOB/DOE which is stable. No recent edema - compliant with meds, unsure what happened with his plavix.     2. HTN - he is compliant with meds  3. Hyperlipidemia - 08/2018 TC 128 TG 58 HDL 35 LDL 81 - compliant with statin  4. CKD 3   5. CVA - admit 08/2018 with left cerebellar stroke.  - no arrhtyhmia detected, TEE benign. 30 day monitor was asked to be arranged today - ongoing left sided weakness.    SH: works at [employer].    Past Medical History:  Diagnosis Date  . CAD (coronary artery disease)    a. s/p NSTEMI in 04/2018 with DES to LCx.   [name] Hypertension   . Myocardial infarction (HCC)   . Pneumonia   . Stroke [facility])      Allergies  Allergen Reactions  . Aspirin     Swelling      Current Outpatient Medications  Medication Sig Dispense Refill  . amLODipine (NORVASC) 10 MG tablet Take 1 tablet (10 mg total) by mouth daily. 90 tablet 3  . amLODipine (NORVASC) 10 MG tablet Take 10 mg by mouth daily.    [name] atorvastatin (LIPITOR) 80 MG tablet Take 1 tablet (80 mg total) by mouth daily at 6 PM. 90 tablet 3  . carvedilol (COREG) 3.125 MG tablet Take 1 tablet (3.125 mg total) by mouth 2 (two) times daily. 180  tablet 3  . hydrALAZINE (APRESOLINE) 50 MG tablet Take 1 tablet (50 mg total) by mouth 3 (three) times daily. 270 tablet 3  . isosorbide mononitrate (IMDUR) 30 MG 24 hr tablet Take 1 tablet (30 mg total) by mouth daily. 90 tablet 3   No current facility-administered medications for this visit.      Past Surgical History:  Procedure Laterality Date  . CORONARY STENT INTERVENTION N/A 05/05/2018   Procedure: CORONARY STENT INTERVENTION;  Surgeon: Marykay Lex, MD;  Location: [facility] INVASIVE CV LAB;  Service: Cardiovascular;  Laterality: N/A;  . LEFT HEART CATH AND CORONARY ANGIOGRAPHY N/A 05/05/2018   Procedure: LEFT HEART CATH AND CORONARY ANGIOGRAPHY;  Surgeon: Marykay Lex, MD;  Location: [facility] INVASIVE CV LAB;  Service: Cardiovascular;  Laterality: N/A;  . TEE WITHOUT CARDIOVERSION N/A 09/08/2018   Procedure: TRANSESOPHAGEAL ECHOCARDIOGRAM (TEE);  Surgeon: Laurey Morale, MD;  Location: [facility] ENDOSCOPY;  Service: Cardiovascular;  Laterality: N/A;     Allergies  Allergen Reactions  . Aspirin     Swelling       Family History  Problem Relation Age of Onset  . Hypertension Mother   . Stroke Mother   . Dementia Mother   . Hypertension Father   . Stroke Father   . Cancer Father   .  Alcoholism Father   . Cancer Sister      Social History Mr. Reusch reports that he has been smoking cigarettes. He has been smoking about 0.50 packs per day. He has never used smokeless tobacco. Mr. Pontrelli reports current alcohol use.   Review of Systems CONSTITUTIONAL: No weight loss, fever, chills, weakness or fatigue.  HEENT: Eyes: No visual loss, blurred vision, double vision or yellow sclerae.No hearing loss, sneezing, congestion, runny nose or sore throat.  SKIN: No rash or itching.  CARDIOVASCULAR: per hpi RESPIRATORY: No shortness of breath, cough or sputum.  GASTROINTESTINAL: No anorexia, nausea, vomiting or diarrhea. No abdominal pain or blood.  GENITOURINARY: No burning on  urination, no polyuria NEUROLOGICAL: left sided weakness MUSCULOSKELETAL: No muscle, back pain, joint pain or stiffness.  LYMPHATICS: No enlarged nodes. No history of splenectomy.  PSYCHIATRIC: No history of depression or anxiety.  ENDOCRINOLOGIC: No reports of sweating, cold or heat intolerance. No polyuria or polydipsia.  [name]   Physical Examination Today's Vitals   02/24/19 1422  BP: 140/84  Pulse: 93  SpO2: 95%  Weight: (!) 325 lb (147.4 kg)  Height: 6' (1.829 m)   Body mass index is 44.08 kg/m.  Gen: resting comfortably, no acute distress HEENT: no scleral icterus, pupils equal round and reactive, no palptable cervical adenopathy,  CV: RRR, no m/r/g, no jvd Resp: Clear to auscultation bilaterally GI: abdomen is soft, non-tender, non-distended, normal bowel sounds, no hepatosplenomegaly MSK: extremities are warm, no edema.  Skin: warm, no rash Neuro:  no focal deficits Psych: appropriate affect   Diagnostic Studies 04/2018 cath  Mid Cx to Dist Cx lesion is 100% stenosed.  A drug-eluting stent was successfully placed using a STENT SYNERGY DES 3X24. -Postdilated to 3.3 mm  Post intervention, there is a 0% residual stenosis.  LAV Groove lesion is 65% stenosed - In a small Athleen Feltner beyond the stent.  There is moderate left ventricular systolic dysfunction. The left ventricular ejection fraction is 35-45% by visual estimate.  Severe single-vessel disease with occlusion of the distal Cx treated with single DES stent. Moderately reduced LVEF with moderately elevated LVEDP.   POST CATH PLAN Plan: Admit to CCU. Continue IV nitroglycerin overnight.  With aspirin allergy, have changed from Plavix to Brilinta for antiplatelet agent.  Elevated LVEDP, but CKD-3, will hydrate and give 1 dose of IV Lasix. -Consider diuretic for part of antihypertensive therapy (consider chlorthalidone).  Aggressive blood pressure control, have converted from metoprolol to carvedilol along  with amlodipine.-Hold off on ARB/ACE inhibitor until renal function stable post cath  High-dose statin   Check 2D echocardiogram to better assess EF   04/2018 echo Study Conclusions  - Left ventricle: The cavity size was normal. There was moderate concentric hypertrophy. Systolic function was normal. The estimated ejection fraction was in the range of 50% to 55%. Wall motion was normal; there were no regional wall motion abnormalities. Doppler parameters are consistent with abnormal left ventricular relaxation (grade 1 diastolic dysfunction). - Pericardium, extracardiac: A trivial pericardial effusion was identified.    Assessment and Plan  1. CAD/ICM/Chronic systolic heart failure - recent stenting as reported above.  - labile LVEF by echo, by most recent study back down to 35-40% - we had stopped coreg due to reported fatigue and erectile dysfunction and at the time his LVEF had normalized. Off coreg symptoms not improved, LVEF has decreased again  - repeat echo. Increase coreg to 6.25mg  bid - unclear why he is off his plavix, he denies any  bleeding.  Restart 75mg  daily, with recent MI and CVA important to continue unless absolute contrainidication. He has ASA allergy.   2. Hyperlipidemia - continue statin  3. HTN - remains elevated, increase coreg to 6.25mg  bid   Note given to excuse from work at least until May 1. Asked to discuss disability with pcp, I think his biggest claim is related to his stroke which is outside of my territory. We are hoping that his LVEF has improved, if not then would also consider his HF as a possible qualifier for disability.       Antoine Poche, M.D.

## 2019-02-24 NOTE — Patient Instructions (Signed)
Medication Instructions:  Start plavix 75 mg - daily  Increase coreg 6.25- two times daily   Labwork: lipid  Testing/Procedures: Your physician has requested that you have an echocardiogram. Echocardiography is a painless test that uses sound waves to create images of your heart. It provides your doctor with information about the size and shape of your heart and how well your heart's chambers and valves are working. This procedure takes approximately one hour. There are no restrictions for this procedure.    Follow-Up: Your physician recommends that you schedule a follow-up appointment in:  3 months    Any Other Special Instructions Will Be Listed Below (If Applicable).     If you need a refill on your cardiac medications before your next appointment, please call your pharmacy.

## 2019-02-25 DIAGNOSIS — R0981 Nasal congestion: Secondary | ICD-10-CM | POA: Diagnosis not present

## 2019-02-25 DIAGNOSIS — H0014 Chalazion left upper eyelid: Secondary | ICD-10-CM | POA: Diagnosis not present

## 2019-02-25 DIAGNOSIS — R0683 Snoring: Secondary | ICD-10-CM | POA: Diagnosis not present

## 2019-02-25 DIAGNOSIS — J309 Allergic rhinitis, unspecified: Secondary | ICD-10-CM | POA: Diagnosis not present

## 2019-02-25 DIAGNOSIS — N183 Chronic kidney disease, stage 3 (moderate): Secondary | ICD-10-CM | POA: Diagnosis not present

## 2019-02-27 ENCOUNTER — Ambulatory Visit (HOSPITAL_COMMUNITY)
Admission: RE | Admit: 2019-02-27 | Discharge: 2019-02-27 | Disposition: A | Discharge status: Home / Self Care | Payer: BLUE CROSS/BLUE SHIELD | Source: Ambulatory Visit | Attending: Cardiology | Admitting: Cardiology

## 2019-02-27 ENCOUNTER — Other Ambulatory Visit: Payer: Self-pay

## 2019-02-27 DIAGNOSIS — I10 Essential (primary) hypertension: Secondary | ICD-10-CM

## 2019-02-27 NOTE — Progress Notes (Signed)
*  PRELIMINARY RESULTS* Echocardiogram 2D Echocardiogram has been performed.  Zachary Mccann 02/27/2019, 2:07 PM

## 2019-03-03 ENCOUNTER — Telehealth: Payer: Self-pay

## 2019-03-03 NOTE — Telephone Encounter (Signed)
-----   Message from Antoine Poche, MD sent at 03/03/2019 11:30 AM EDT ----- Echo shows heart function mildly improved, still remains mildly below normal. We will continue current meds. Can we seem him at 2 months instead of 3 months as previously scheduled.    Dominga Ferry MD

## 2019-03-03 NOTE — Telephone Encounter (Signed)
Called pt. No answer. Left message for pt to return call.  

## 2019-03-05 ENCOUNTER — Encounter (INDEPENDENT_AMBULATORY_CARE_PROVIDER_SITE_OTHER): Payer: Self-pay | Admitting: Nurse Practitioner

## 2019-03-09 DIAGNOSIS — R809 Proteinuria, unspecified: Secondary | ICD-10-CM | POA: Diagnosis not present

## 2019-03-09 DIAGNOSIS — I10 Essential (primary) hypertension: Secondary | ICD-10-CM | POA: Diagnosis not present

## 2019-03-09 DIAGNOSIS — Z79899 Other long term (current) drug therapy: Secondary | ICD-10-CM | POA: Diagnosis not present

## 2019-03-09 DIAGNOSIS — N183 Chronic kidney disease, stage 3 (moderate): Secondary | ICD-10-CM | POA: Diagnosis not present

## 2019-03-09 DIAGNOSIS — D509 Iron deficiency anemia, unspecified: Secondary | ICD-10-CM | POA: Diagnosis not present

## 2019-03-09 DIAGNOSIS — E559 Vitamin D deficiency, unspecified: Secondary | ICD-10-CM | POA: Diagnosis not present

## 2019-03-17 DIAGNOSIS — I10 Essential (primary) hypertension: Secondary | ICD-10-CM | POA: Diagnosis not present

## 2019-03-17 DIAGNOSIS — R809 Proteinuria, unspecified: Secondary | ICD-10-CM | POA: Diagnosis not present

## 2019-03-17 DIAGNOSIS — N183 Chronic kidney disease, stage 3 (moderate): Secondary | ICD-10-CM | POA: Diagnosis not present

## 2019-04-08 ENCOUNTER — Ambulatory Visit (INDEPENDENT_AMBULATORY_CARE_PROVIDER_SITE_OTHER): Payer: BLUE CROSS/BLUE SHIELD | Admitting: Internal Medicine

## 2019-04-08 ENCOUNTER — Telehealth: Payer: Self-pay | Admitting: Neurology

## 2019-04-08 DIAGNOSIS — E559 Vitamin D deficiency, unspecified: Secondary | ICD-10-CM | POA: Diagnosis not present

## 2019-04-08 DIAGNOSIS — Z713 Dietary counseling and surveillance: Secondary | ICD-10-CM | POA: Diagnosis not present

## 2019-04-08 DIAGNOSIS — I10 Essential (primary) hypertension: Secondary | ICD-10-CM | POA: Diagnosis not present

## 2019-04-08 DIAGNOSIS — N183 Chronic kidney disease, stage 3 (moderate): Secondary | ICD-10-CM | POA: Diagnosis not present

## 2019-04-08 NOTE — Telephone Encounter (Signed)
Due to current COVID 19 pandemic, our office is severely reducing in office visits until further notice, in order to minimize the risk to our patients and healthcare providers.  Called patient to offer him a sooner appointment via virtual visit. Attempted to set him up virtually but he did not know if he had an e-mail. I then offered a telephone visit and he stated that would be easier. I advised patient to be near his phone and waiting for a call from Dr. Vickey Huger. Patient understands that he will also receive a call from the RN to update his chart, as well as a call from front office staff 30 minutes prior to his appointment.   Pt understands that although there may be some limitations with this type of visit, we will take all precautions to reduce any security or privacy concerns.  Pt understands that this will be treated like an in office visit and we will file with pt's insurance, and there may be a patient responsible charge related to this service.

## 2019-04-16 ENCOUNTER — Encounter: Payer: Self-pay | Admitting: Neurology

## 2019-04-16 NOTE — Addendum Note (Signed)
Addended by: Judi Cong on: 04/16/2019 03:14 PM   Modules accepted: Orders

## 2019-04-16 NOTE — Telephone Encounter (Signed)
Called the patient to review their chart and made sure that everything was up to date. Pt doesn't have the capability for video visit and states will complete the phone visit. Instructed the patient that apx 30 min prior to the appointment the front staff will contact them to make sure they are ready to go for their appointment in case there is any need for troubleshooting it can be completed prior to the appointment time. Pt verbalized understanding.

## 2019-04-20 NOTE — Telephone Encounter (Signed)
Called the patient back today after reviewing his last office note. This apt was made back in feb office visit to allow the patient time to have completed the sleep study. Pt had cancelled the sleep study in march that he was scheduled for and had not been rescheduled. Advised the patient I will reach out to the sleep lab and let them know and see if he is a candidate for HST since we arent seeing patients at this time. If not then advised the patient he will be placed on the call list to get rescheduled for in lab study once we are up and running again. Advised the patient we will cancel tomorrow's apt for him and allow him time to complete sleep studies at which point I will reschedule after the sleep study. Pt verbalized understanding.

## 2019-04-21 ENCOUNTER — Ambulatory Visit: Payer: BLUE CROSS/BLUE SHIELD | Admitting: Neurology

## 2019-05-04 ENCOUNTER — Ambulatory Visit: Payer: BLUE CROSS/BLUE SHIELD | Admitting: Adult Health

## 2019-05-28 ENCOUNTER — Telehealth (INDEPENDENT_AMBULATORY_CARE_PROVIDER_SITE_OTHER): Payer: Self-pay | Admitting: Cardiology

## 2019-05-28 ENCOUNTER — Other Ambulatory Visit: Payer: Self-pay

## 2019-05-28 VITALS — Ht 72.0 in | Wt 325.0 lb

## 2019-05-28 DIAGNOSIS — I2510 Atherosclerotic heart disease of native coronary artery without angina pectoris: Secondary | ICD-10-CM

## 2019-05-28 DIAGNOSIS — I10 Essential (primary) hypertension: Secondary | ICD-10-CM

## 2019-05-28 DIAGNOSIS — I5022 Chronic systolic (congestive) heart failure: Secondary | ICD-10-CM

## 2019-05-28 DIAGNOSIS — E782 Mixed hyperlipidemia: Secondary | ICD-10-CM

## 2019-05-28 NOTE — Progress Notes (Signed)
Virtual Visit via Telephone Note   This visit type was conducted due to national recommendations for restrictions regarding the COVID-19 Pandemic (e.g. social distancing) in an effort to limit this patient's exposure and mitigate transmission in our community.  Due to his co-morbid illnesses, this patient is at least at moderate risk for complications without adequate follow up.  This format is felt to be most appropriate for this patient at this time.  The patient did not have access to video technology/had technical difficulties with video requiring transitioning to audio format only (telephone).  All issues noted in this document were discussed and addressed.  No physical exam could be performed with this format.  Please refer to the patient's chart for his  consent to telehealth for [facility].   Date:  05/28/2019   ID:  Zachary Mccann, DOB Feb 23, 1971, MRN [id number]  Patient Location: Home Provider Location: Home  PCP:  Ailene Ards, NP  Cardiologist:  Carlyle Dolly, MD  Electrophysiologist:  None   Evaluation Performed:  Follow-Up Visit  Chief Complaint:  3 month follow up  History of Present Illness:    Zachary Mccann is a 48 y.o. male seen today for follow up of the following medical problems.   1. CAD/ICM  - NSTEMI 04/2018, cath showed occluded LCX. Received DES. LVgram 35-45% - 04/2018 echo LVEF 50-55%, no WMAs, grade I diastolic dysfunction - ASA allergy, hadbeen on brillinta alone. We tried stopping coreg due to fatigue and erectile dysfunction but no improvement in symptoms. No ACE/ARB/ARNI/aldactone due to poor renal function  - 08/2018 had CVA, he was changed from effient to plavix as there is contraindication for effient and CVA - during 08/2018 admission with CVA echo showed LVEF back down to 35-40%.   02/2019 echo LVEF 40-45% - no recent chest pain. Has had some swelling left ankle, no swelling on right. Has residual weakness left sided. No  recent SOB or DOE   2. HTN -compliant with  meds - does not have bp cuff    3. Hyperlipidemia - 08/2018 TC 128 TG 58 HDL 35 LDL 81 - he remains compliant with statin  4. CKD 3 - followed by neprhology Dr Hinda Lenis Form 02/2019 note reports Cr mildly improved to 1.68  5. CVA - admit 08/2018 with left cerebellar stroke.  - no arrhtyhmia detected, TEE benign. 30 day monitor was asked to be arranged today - ongoing left sided weakness.    SH: works at [employer].Working on disbaility due to heart disease and stroke.   The patient does not have symptoms concerning for COVID-19 infection (fever, chills, cough, or new shortness of breath).    Past Medical History:  Diagnosis Date  . CAD (coronary artery disease)    a. s/p NSTEMI in 04/2018 with DES to LCx.   [name] Hypertension   . Myocardial infarction ([place])   . Pneumonia   . Stroke [facility])    Past Surgical History:  Procedure Laterality Date  . CORONARY STENT INTERVENTION N/A 05/05/2018   Procedure: CORONARY STENT INTERVENTION;  Surgeon: Leonie Man, MD;  Location: [place] CV LAB;  Service: Cardiovascular;  Laterality: N/A;  . LEFT HEART CATH AND CORONARY ANGIOGRAPHY N/A 05/05/2018   Procedure: LEFT HEART CATH AND CORONARY ANGIOGRAPHY;  Surgeon: Leonie Man, MD;  Location: [place] CV LAB;  Service: Cardiovascular;  Laterality: N/A;  . TEE WITHOUT CARDIOVERSION N/A 09/08/2018   Procedure: TRANSESOPHAGEAL ECHOCARDIOGRAM (TEE);  Surgeon: Larey Dresser, MD;  Location: [facility] ENDOSCOPY;  Service: Cardiovascular;  Laterality: N/A;     No outpatient medications have been marked as taking for the 05/28/19 encounter (Appointment) with Antoine Poche, MD.     Allergies:   Aspirin   Social History   Tobacco Use  . Smoking status: Current Every Day Smoker    Packs/day: 0.25    Types: Cigarettes  . Smokeless tobacco: Never Used  . Tobacco comment: smoked for 10-20 years at 1/2 ppd as of 2019   Substance Use Topics  . Alcohol use: Not Currently  . Drug use: Not Currently     Family Hx: The patient's family history includes Alcoholism in his father; Cancer in his father and sister; Dementia in his mother; Hypertension in his father and mother; Stroke in his father and mother.  ROS:   Please see the history of present illness.     All other systems reviewed and are negative.   Prior CV studies:   The following studies were reviewed today:  04/2018 cath  Mid Cx to Dist Cx lesion is 100% stenosed.  A drug-eluting stent was successfully placed using a STENT SYNERGY DES 3X24. -Postdilated to 3.3 mm  Post intervention, there is a 0% residual stenosis.  LAV Groove lesion is 65% stenosed - In a small Kendal Ghazarian beyond the stent.  There is moderate left ventricular systolic dysfunction. The left ventricular ejection fraction is 35-45% by visual estimate.  Severe single-vessel disease with occlusion of the distal Cx treated with single DES stent. Moderately reduced LVEF with moderately elevated LVEDP.   POST CATH PLAN Plan: Admit to CCU. Continue IV nitroglycerin overnight.  With aspirin allergy, have changed from Plavix to Brilinta for antiplatelet agent.  Elevated LVEDP, but CKD-3, will hydrate and give 1 dose of IV Lasix. -Consider diuretic for part of antihypertensive therapy (consider chlorthalidone).  Aggressive blood pressure control, have converted from metoprolol to carvedilol along with amlodipine.-Hold off on ARB/ACE inhibitor until renal function stable post cath  High-dose statin   Check 2D echocardiogram to better assess EF   04/2018 echo Study Conclusions  - Left ventricle: The cavity size was normal. There was moderate concentric hypertrophy. Systolic function was normal. The estimated ejection fraction was in the range of 50% to 55%. Wall motion was normal; there were no regional wall motion abnormalities. Doppler parameters are  consistent with abnormal left ventricular relaxation (grade 1 diastolic dysfunction). - Pericardium, extracardiac: A trivial pericardial effusion was identified.   02/2019 echo IMPRESSIONS    1. Stage 1: Stage: Mid and apical inferior wall, basal and mid inferolateral wall, mid anterolateral segment, and apex are abnormal.  2. The cavity size was mildly dilated. There is mild posterior wall and moderate septal left ventricular hypertrophy. LVEF 40-45%.  3. The aortic root is normal in size and structure.  4. The aortic valve was not well visualized.  5. The right ventricle has normal systolic function. The cavity was normal. There is no increase in right ventricular wall thickness.  6. The mitral valve is grossly normal.   Labs/Other Tests and Data Reviewed:    EKG:  No ECG reviewed.  Recent Labs: 09/06/2018: ALT 32 09/08/2018: BUN 18; Creatinine, Ser 1.82; Hemoglobin 13.9; Platelets 187; Potassium 3.8; Sodium 137; TSH 1.812   Recent Lipid Panel Lab Results  Component Value Date/Time   CHOL 128 09/08/2018 06:46 AM   TRIG 58 09/08/2018 06:46 AM   HDL 35 (L) 09/08/2018 06:46 AM   CHOLHDL 3.7 09/08/2018 06:46 AM  LDLCALC 81 09/08/2018 06:46 AM    Wt Readings from Last 3 Encounters:  02/24/19 (!) 325 lb (147.4 kg)  01/19/19 (!) 323 lb (146.5 kg)  11/20/18 (!) 304 lb 12.8 oz (138.3 kg)     Objective:    Vital Signs:  There were no vitals taken for this visit.   Normal affect. Normal speech pattern and tone. Comfortable, no apparent distress. No audible signs of SOB or wheezing.   ASSESSMENT & PLAN:    1. CAD/ICM/Chronic systolic heart failure - no recent symptoms. Medical therapy limited by renal disease - continue current meds  2. Hyperlipidemia -he will conitnue statin  3. HTN - continue current meds   Note given to excuse from work at least until May 1. Asked to discuss disability with pcp, I think his biggest claim is related to his stroke which is  outside of my territory. We are hoping that his LVEF has improved, if not then would also consider his HF as a possible qualifier for disability.    COVID-19 Education: The signs and symptoms of COVID-19 were discussed with the patient and how to seek care for testing (follow up with PCP or arrange E-visit).  The importance of social distancing was discussed today.  Time:   Today, I have spent 15 minutes with the patient with telehealth technology discussing the above problems.     Medication Adjustments/Labs and Tests Ordered: Current medicines are reviewed at length with the patient today.  Concerns regarding medicines are outlined above.   Tests Ordered: No orders of the defined types were placed in this encounter.   Medication Changes: No orders of the defined types were placed in this encounter.   Disposition:  Follow up 4 months  Signed, Dina RichBranch, Sharla Tankard, MD  05/28/2019 8:07 AM    [facility]

## 2019-05-28 NOTE — Patient Instructions (Signed)
Medication Instructions:  Your physician recommends that you continue on your current medications as directed. Please refer to the Current Medication list given to you today.   Labwork: none  Testing/Procedures: None  Follow-Up: Your physician recommends that you schedule a follow-up appointment in: 4 months    Any Other Special Instructions Will Be Listed Below (If Applicable).     If you need a refill on your cardiac medications before your next appointment, please call your pharmacy.   

## 2019-06-18 DIAGNOSIS — Z0271 Encounter for disability determination: Secondary | ICD-10-CM

## 2019-07-10 ENCOUNTER — Encounter (HOSPITAL_COMMUNITY): Payer: Self-pay

## 2019-07-10 ENCOUNTER — Inpatient Hospital Stay (HOSPITAL_COMMUNITY)
Admission: EM | Admit: 2019-07-10 | Discharge: 2019-07-17 | DRG: 064 | Disposition: A | Discharge status: Rehabilitation Facility | Payer: Medicaid Other | Attending: Internal Medicine | Admitting: Internal Medicine

## 2019-07-10 ENCOUNTER — Emergency Department (HOSPITAL_COMMUNITY): Payer: Medicaid Other

## 2019-07-10 ENCOUNTER — Other Ambulatory Visit: Payer: Self-pay

## 2019-07-10 DIAGNOSIS — I6340 Cerebral infarction due to embolism of unspecified cerebral artery: Secondary | ICD-10-CM | POA: Insufficient documentation

## 2019-07-10 DIAGNOSIS — Z9114 Patient's other noncompliance with medication regimen: Secondary | ICD-10-CM | POA: Diagnosis not present

## 2019-07-10 DIAGNOSIS — G9341 Metabolic encephalopathy: Secondary | ICD-10-CM | POA: Diagnosis present

## 2019-07-10 DIAGNOSIS — R29810 Facial weakness: Secondary | ICD-10-CM | POA: Diagnosis not present

## 2019-07-10 DIAGNOSIS — N183 Chronic kidney disease, stage 3 unspecified: Secondary | ICD-10-CM | POA: Diagnosis present

## 2019-07-10 DIAGNOSIS — R4702 Dysphasia: Secondary | ICD-10-CM | POA: Diagnosis present

## 2019-07-10 DIAGNOSIS — I429 Cardiomyopathy, unspecified: Secondary | ICD-10-CM | POA: Diagnosis present

## 2019-07-10 DIAGNOSIS — I2510 Atherosclerotic heart disease of native coronary artery without angina pectoris: Secondary | ICD-10-CM | POA: Diagnosis not present

## 2019-07-10 DIAGNOSIS — Z6841 Body Mass Index (BMI) 40.0 and over, adult: Secondary | ICD-10-CM | POA: Diagnosis not present

## 2019-07-10 DIAGNOSIS — Z20828 Contact with and (suspected) exposure to other viral communicable diseases: Secondary | ICD-10-CM | POA: Diagnosis present

## 2019-07-10 DIAGNOSIS — E669 Obesity, unspecified: Secondary | ICD-10-CM | POA: Diagnosis not present

## 2019-07-10 DIAGNOSIS — I130 Hypertensive heart and chronic kidney disease with heart failure and stage 1 through stage 4 chronic kidney disease, or unspecified chronic kidney disease: Secondary | ICD-10-CM | POA: Diagnosis present

## 2019-07-10 DIAGNOSIS — E785 Hyperlipidemia, unspecified: Secondary | ICD-10-CM | POA: Diagnosis present

## 2019-07-10 DIAGNOSIS — I63132 Cerebral infarction due to embolism of left carotid artery: Secondary | ICD-10-CM

## 2019-07-10 DIAGNOSIS — I63232 Cerebral infarction due to unspecified occlusion or stenosis of left carotid arteries: Principal | ICD-10-CM | POA: Diagnosis present

## 2019-07-10 DIAGNOSIS — Z7902 Long term (current) use of antithrombotics/antiplatelets: Secondary | ICD-10-CM | POA: Diagnosis not present

## 2019-07-10 DIAGNOSIS — I252 Old myocardial infarction: Secondary | ICD-10-CM | POA: Diagnosis not present

## 2019-07-10 DIAGNOSIS — I639 Cerebral infarction, unspecified: Secondary | ICD-10-CM

## 2019-07-10 DIAGNOSIS — I5022 Chronic systolic (congestive) heart failure: Secondary | ICD-10-CM | POA: Diagnosis not present

## 2019-07-10 DIAGNOSIS — G8191 Hemiplegia, unspecified affecting right dominant side: Secondary | ICD-10-CM | POA: Diagnosis present

## 2019-07-10 DIAGNOSIS — Z823 Family history of stroke: Secondary | ICD-10-CM

## 2019-07-10 DIAGNOSIS — Z955 Presence of coronary angioplasty implant and graft: Secondary | ICD-10-CM | POA: Diagnosis not present

## 2019-07-10 DIAGNOSIS — R4701 Aphasia: Secondary | ICD-10-CM | POA: Diagnosis present

## 2019-07-10 DIAGNOSIS — R4781 Slurred speech: Secondary | ICD-10-CM

## 2019-07-10 DIAGNOSIS — I10 Essential (primary) hypertension: Secondary | ICD-10-CM | POA: Diagnosis present

## 2019-07-10 DIAGNOSIS — F17210 Nicotine dependence, cigarettes, uncomplicated: Secondary | ICD-10-CM | POA: Diagnosis present

## 2019-07-10 DIAGNOSIS — R4182 Altered mental status, unspecified: Secondary | ICD-10-CM | POA: Diagnosis present

## 2019-07-10 DIAGNOSIS — Z79899 Other long term (current) drug therapy: Secondary | ICD-10-CM

## 2019-07-10 DIAGNOSIS — R29711 NIHSS score 11: Secondary | ICD-10-CM | POA: Diagnosis present

## 2019-07-10 DIAGNOSIS — N179 Acute kidney failure, unspecified: Secondary | ICD-10-CM | POA: Diagnosis present

## 2019-07-10 LAB — DIFFERENTIAL
Abs Immature Granulocytes: 0.04 10*3/uL (ref 0.00–0.07)
Basophils Absolute: 0 10*3/uL (ref 0.0–0.1)
Basophils Relative: 0 %
Eosinophils Absolute: 0.1 10*3/uL (ref 0.0–0.5)
Eosinophils Relative: 1 %
Immature Granulocytes: 1 %
Lymphocytes Relative: 20 %
Lymphs Abs: 1.7 10*3/uL (ref 0.7–4.0)
Monocytes Absolute: 1 10*3/uL (ref 0.1–1.0)
Monocytes Relative: 12 %
Neutro Abs: 5.9 10*3/uL (ref 1.7–7.7)
Neutrophils Relative %: 66 %

## 2019-07-10 LAB — URINALYSIS, ROUTINE W REFLEX MICROSCOPIC
Bacteria, UA: NONE SEEN
Bilirubin Urine: NEGATIVE
Glucose, UA: NEGATIVE mg/dL
Ketones, ur: NEGATIVE mg/dL
Leukocytes,Ua: NEGATIVE
Nitrite: NEGATIVE
Protein, ur: 100 mg/dL — AB
Specific Gravity, Urine: 1.025 (ref 1.005–1.030)
pH: 5 (ref 5.0–8.0)

## 2019-07-10 LAB — COMPREHENSIVE METABOLIC PANEL
ALT: 10 U/L (ref 0–44)
AST: 13 U/L — ABNORMAL LOW (ref 15–41)
Albumin: 3.9 g/dL (ref 3.5–5.0)
Alkaline Phosphatase: 71 U/L (ref 38–126)
Anion gap: 12 (ref 5–15)
BUN: 32 mg/dL — ABNORMAL HIGH (ref 6–20)
CO2: 22 mmol/L (ref 22–32)
Calcium: 9.1 mg/dL (ref 8.9–10.3)
Chloride: 102 mmol/L (ref 98–111)
Creatinine, Ser: 2.38 mg/dL — ABNORMAL HIGH (ref 0.61–1.24)
GFR calc Af Amer: 36 mL/min — ABNORMAL LOW (ref >60)
GFR calc non Af Amer: 31 mL/min — ABNORMAL LOW (ref >60)
Glucose, Bld: 116 mg/dL — ABNORMAL HIGH (ref 70–99)
Potassium: 3.8 mmol/L (ref 3.5–5.1)
Sodium: 136 mmol/L (ref 135–145)
Total Bilirubin: 0.8 mg/dL (ref 0.3–1.2)
Total Protein: 9.2 g/dL — ABNORMAL HIGH (ref 6.5–8.1)

## 2019-07-10 LAB — PROTIME-INR
INR: 1.1 (ref 0.8–1.2)
Prothrombin Time: 13.9 seconds (ref 11.4–15.2)

## 2019-07-10 LAB — RAPID URINE DRUG SCREEN, HOSP PERFORMED
Amphetamines: NOT DETECTED
Barbiturates: NOT DETECTED
Benzodiazepines: NOT DETECTED
Cocaine: NOT DETECTED
Opiates: NOT DETECTED
Tetrahydrocannabinol: POSITIVE — AB

## 2019-07-10 LAB — CBC
HCT: 42.5 % (ref 39.0–52.0)
Hemoglobin: 14.3 g/dL (ref 13.0–17.0)
MCH: 31.8 pg (ref 26.0–34.0)
MCHC: 33.6 g/dL (ref 30.0–36.0)
MCV: 94.4 fL (ref 80.0–100.0)
Platelets: 240 10*3/uL (ref 150–400)
RBC: 4.5 MIL/uL (ref 4.22–5.81)
RDW: 11.8 % (ref 11.5–15.5)
WBC: 8.8 10*3/uL (ref 4.0–10.5)
nRBC: 0 % (ref 0.0–0.2)

## 2019-07-10 LAB — APTT: aPTT: 34 seconds (ref 24–36)

## 2019-07-10 LAB — ETHANOL: Alcohol, Ethyl (B): <10 mg/dL (ref <10)

## 2019-07-10 LAB — SARS CORONAVIRUS 2 BY RT PCR (HOSPITAL ORDER, PERFORMED IN ~~LOC~~ HOSPITAL LAB): SARS Coronavirus 2: NEGATIVE

## 2019-07-10 MED ORDER — SODIUM CHLORIDE 0.9 % IV SOLN
INTRAVENOUS | Status: AC
  Administered 2019-07-10 (×2): via INTRAVENOUS

## 2019-07-10 MED ORDER — CLOPIDOGREL BISULFATE 75 MG PO TABS
75.0000 mg | ORAL_TABLET | Freq: Every day | ORAL | Status: DC
  Administered 2019-07-10 – 2019-07-17 (×6): 75 mg via ORAL
  Filled 2019-07-10 (×6): qty 1

## 2019-07-10 MED ORDER — ACETAMINOPHEN 160 MG/5ML PO SOLN: 650.0000 mg | ORAL | Status: DC | PRN

## 2019-07-10 MED ORDER — ATORVASTATIN CALCIUM 40 MG PO TABS
40.0000 mg | ORAL_TABLET | Freq: Every day | ORAL | Status: DC
  Administered 2019-07-10 – 2019-07-16 (×5): 40 mg via ORAL
  Filled 2019-07-10 (×5): qty 1

## 2019-07-10 MED ORDER — SENNOSIDES-DOCUSATE SODIUM 8.6-50 MG PO TABS: 1.0000 | ORAL_TABLET | Freq: Every evening | ORAL | Status: DC | PRN

## 2019-07-10 MED ORDER — STROKE: EARLY STAGES OF RECOVERY BOOK
Freq: Once | Status: AC
  Administered 2019-07-10
  Filled 2019-07-10: qty 1

## 2019-07-10 MED ORDER — ACETAMINOPHEN 650 MG RE SUPP: 650.0000 mg | RECTAL | Status: DC | PRN

## 2019-07-10 MED ORDER — ACETAMINOPHEN 325 MG PO TABS: 650.0000 mg | ORAL_TABLET | ORAL | Status: DC | PRN

## 2019-07-10 NOTE — ED Provider Notes (Signed)
[facility] EMERGENCY DEPARTMENT Provider Note   CSN: [id number] Arrival date & time: 07/10/19  1643    History   Chief Complaint Chief Complaint  Patient presents with   Altered Mental Status    HPI Zachary Mccann is a 48 y.o. male.     HPI Patient presents with niece.  Patient woke up yesterday morning with facial droop, slurred speech and difficulty ambulating.  Refused to come to the emergency department initially.  Patient symptoms have been persistent.  Has history of previous stroke.  Denies visual changes.  Patient is only been taking medication for his blood pressure.  Niece states that he has not been able to afford his other medications.  Patient has mild left-sided residual weakness. Past Medical History:  Diagnosis Date   CAD (coronary artery disease)    a. s/p NSTEMI in 04/2018 with DES to LCx.    Hypertension    Myocardial infarction (HCC)    Pneumonia    Stroke [facility](HCC)     Patient Active Problem List   Diagnosis Date Noted   Cardiomyopathy (HCC) 11/20/2018   History of stroke 09/06/2018   Tobacco use 09/06/2018   History of non-ST elevation myocardial infarction (NSTEMI) 05/05/2018   Severe Vitamin D deficiency 04/02/2018   Community acquired pneumonia of left lower lobe of lung (HCC) 04/02/2018   CKD (chronic kidney disease), stage III (HCC) = Secondary to Hypertension 04/02/2018   Pneumonia 03/30/2018   Metabolic acidosis 03/30/2018   AKI (acute kidney injury) (HCC) 03/30/2018   N&V (nausea and vomiting) 03/30/2018   Essential hypertension 03/30/2018    Past Surgical History:  Procedure Laterality Date   CORONARY STENT INTERVENTION N/A 05/05/2018   Procedure: CORONARY STENT INTERVENTION;  Surgeon: Marykay LexHarding, Nature Vogelsang W, MD;  [facility];  Service: Cardiovascular;  Laterality: N/A;   LEFT HEART CATH AND CORONARY ANGIOGRAPHY N/A 05/05/2018   Procedure: LEFT HEART CATH AND CORONARY ANGIOGRAPHY;  Surgeon: Marykay LexHarding, Herma Uballe  W, MD;  [facility];  Service: Cardiovascular;  Laterality: N/A;   TEE WITHOUT CARDIOVERSION N/A 09/08/2018   Procedure: TRANSESOPHAGEAL ECHOCARDIOGRAM (TEE);  Surgeon: Laurey MoraleMcLean, Dalton S, MD;  [facility];  Service: Cardiovascular;  Laterality: N/A;        Home Medications    Prior to Admission medications   Medication Sig Start Date End Date Taking? Authorizing Provider  carvedilol (COREG) 3.125 MG tablet Take 3.125 mg by mouth 2 (two) times daily with a meal.    Yes [provider]    Family History Family History  Problem Relation Age of Onset   Hypertension Mother    Stroke Mother    Dementia Mother    Hypertension Father    Stroke Father    Cancer Father    Alcoholism Father    Cancer Sister     Social History Social History   Tobacco Use   Smoking status: Current Every Day Smoker    Packs/day: 0.25    Types: Cigarettes   Smokeless tobacco: Never Used   Tobacco comment: smoked for 10-20 years at 1/2 ppd as of 2019  Substance Use Topics   Alcohol use: Not Currently   Drug use: Not Currently     Allergies   Aspirin   Review of Systems Review of Systems  Constitutional: Negative for chills and fever.  HENT: Negative for trouble swallowing.   Eyes: Positive for visual disturbance.  Respiratory: Negative for shortness of breath.   Cardiovascular: Negative for chest  pain.  Gastrointestinal: Negative for abdominal pain, diarrhea, nausea and vomiting.  Musculoskeletal: Positive for gait problem. Negative for back pain, myalgias and neck pain.  Skin: Negative for rash and wound.  Neurological: Positive for speech difficulty and weakness. Negative for dizziness, numbness and headaches.  All other systems reviewed and are negative.    Physical Exam Updated Vital Signs BP (!) 136/92    Pulse 80    Temp 98.2 F (36.8 C) (Oral)    Resp 17    Ht 6' (1.829 m)    Wt (!) 147 kg    SpO2 99%    BMI 43.95 kg/m    Physical Exam Vitals signs and nursing note reviewed.  Constitutional:      Appearance: Normal appearance. He is well-developed.  HENT:     Head: Normocephalic and atraumatic.     Comments: No definite facial asymmetry noted.  Patient does have garbled speech.  Protecting airway.    Mouth/Throat:     Mouth: Mucous membranes are moist.  Eyes:     Extraocular Movements: Extraocular movements intact.     Pupils: Pupils are equal, round, and reactive to light.  Neck:     Musculoskeletal: Normal range of motion and neck supple.  Cardiovascular:     Rate and Rhythm: Normal rate and regular rhythm.     Heart sounds: No murmur. No friction rub. No gallop.   Pulmonary:     Effort: Pulmonary effort is normal. No respiratory distress.     Breath sounds: Normal breath sounds. No stridor. No wheezing, rhonchi or rales.  Chest:     Chest wall: No tenderness.  Abdominal:     General: Bowel sounds are normal. There is no distension.     Palpations: Abdomen is soft.     Tenderness: There is no abdominal tenderness. There is no right CVA tenderness, left CVA tenderness, guarding or rebound.  Musculoskeletal: Normal range of motion.        General: No swelling, tenderness, deformity or signs of injury.     Right lower leg: No edema.     Left lower leg: No edema.  Skin:    General: Skin is warm and dry.     Capillary Refill: Capillary refill takes less than 2 seconds.     Findings: No erythema or rash.  Neurological:     Mental Status: He is alert and oriented to person, place, and time.     Comments: 4/5 right grip strength and 3/5 right lower extremity strength.  5/5 left grip strength.  4/5 left lower extremity strength.  Sensation intact.  Psychiatric:        Behavior: Behavior normal.      ED Treatments / Results  Labs (all labs ordered are listed, but only abnormal results are displayed) Labs Reviewed  COMPREHENSIVE METABOLIC PANEL - Abnormal; Notable for the following  components:      Result Value   Glucose, Bld 116 (*)    BUN 32 (*)    Creatinine, Ser 2.38 (*)    Total Protein 9.2 (*)    AST 13 (*)    GFR calc non Af Amer 31 (*)    GFR calc Af Amer 36 (*)    All other components within normal limits  SARS CORONAVIRUS 2 (HOSPITAL ORDER, [place] [facility])  ETHANOL  PROTIME-INR  APTT  CBC  DIFFERENTIAL  RAPID URINE DRUG SCREEN, HOSP PERFORMED  URINALYSIS, ROUTINE W REFLEX MICROSCOPIC    EKG  EKG Interpretation  Date/Time:  Friday July 10 2019 17:23:50 EDT Ventricular Rate:  89 PR Interval:    QRS Duration: 114 QT Interval:  391 QTC Calculation: 476 R Axis:   42 Text Interpretation:  Sinus rhythm Biatrial enlargement Borderline intraventricular conduction delay Borderline T abnormalities, lateral leads Borderline prolonged QT interval Confirmed by Loren Racer ([id number]) on 07/10/2019 7:14:42 PM   Radiology Ct Head Wo Contrast  Result Date: 07/10/2019 CLINICAL DATA:  Slurred speech and loss of balance EXAM: CT HEAD WITHOUT CONTRAST TECHNIQUE: Contiguous axial images were obtained from the base of the skull through the vertex without intravenous contrast. COMPARISON:  09/08/2018 FINDINGS: Brain: Wedge-shaped area decreased attenuation is noted in the superior aspect of the left cerebellum consistent with prior infarct. This corresponds to an area of prior ischemia on the previous exam. There are scattered areas of decreased attenuation in the periventricular white matter consistent with prior lacunar infarcts. These have increased in the interval from the prior exam. No findings to suggest acute infarct or acute hemorrhage are seen. Vascular: No hyperdense vessel or unexpected calcification. Skull: Normal. Negative for fracture or focal lesion. Sinuses/Orbits: No acute finding. Other: None. IMPRESSION: Area of prior infarction in the superior aspect of the left cerebellum. Areas of prior ischemia are noted. The changes on the  left are new from the prior exam but consistent with more subacute to chronic ischemia. No evidence of acute hemorrhage noted. Electronically Signed   By: Alcide Clever M.D.   On: 07/10/2019 17:26    Procedures Procedures (including critical care time)  Medications Ordered in ED Medications - No data to display   Initial Impression / Assessment and Plan / ED Course  I have reviewed the triage vital signs and the nursing notes.  Pertinent labs & imaging results that were available during my care of the patient were reviewed by me and considered in my medical decision making (see chart for details).       Subacute versus chronic stroke on CT scan.  Spoke with hospitalist who will arrange transfer to [facility].  Dr. Laurence Slate is aware and will anticipate patient.   Final Clinical Impressions(s) / ED Diagnoses   Final diagnoses:  Ischemic stroke [facility])  AKI (acute kidney injury) [facility])    ED Discharge Orders    None       Loren Racer, MD 07/10/19 1914

## 2019-07-10 NOTE — ED Notes (Signed)
Niece is leaving ED to go home. Requested to be updated if any unforseen changes occur in pt or plan of care: [phone]

## 2019-07-10 NOTE — H&P (Addendum)
History and Physical    Zachary SkeensShawn T Detore LKG:[id number] DOB: 06/05/1971 DOA: 07/10/2019  PCP: Elenore PaddyGray, Sarah E, NP   Patient coming from: Home  I have personally briefly reviewed patient's old medical [facility]  Chief Complaint: Slurred speech, right facial droop  HPI: Zachary Mccann is a 48 y.o. male with medical history significant for CVA, hypertension, CKD 3, coronary artery disease, who was brought to the ED by niece with complaints of right facial droop, and slurred speech that started yesterday morning.  Patients niece saw patient today at about 3-4pm, and reported patient was not able to walk, he was also a bit lethargic, with facial droop involving his right side, and muffled speech.  She brought patient to the ED. Patient lives with his fiance and 5 kids. Patient has history of CVA, 08/2018-left cerebellar SCA territory CVA, patient reports persistent left-sided deficits-weakness and numbness from the stroke. Patient over the past few weeks has not taking most of his medications-due to financial constraints and loosing insurance coverage, but he reports he has been compliant with his Plavix and carvedilol. Patient reports a dry cough that started in the ED, no difficulty breathing no fever or chills, no vomiting, no loose stools, no pain with urination.  ED Course: Blood pressure systolic 120s to 664Q130s, sats 99% on room air.  Creatinine elevated 2.38.  EKG with nonspecific T wave changes aVL V5 and V6.  Head CT -which changes on the left which are new from the prior exam but consistent with more subacute to chronic ischemia.  [facility], [facility] further stroke work[facility]up not [facility].  Hospitalist to admit for acute CVA.  Review of Systems: As per HPI all other systems reviewed and negative.  Past Medical History:  Diagnosis Date  . CAD (coronary artery disease)    a. s/p NSTEMI in  04/2018 with DES to LCx.   [name]. Hypertension   . Myocardial infarction (HCC)   . Pneumonia   . Stroke [facility](HCC)     Past Surgical History:  Procedure Laterality Date  . CORONARY STENT INTERVENTION N/A 05/05/2018   Procedure: CORONARY STENT INTERVENTION;  Surgeon: Marykay LexHarding, David W, MD;  [facility];  Service: Cardiovascular;  Laterality: N/A;  . LEFT HEART CATH AND CORONARY ANGIOGRAPHY N/A 05/05/2018   Procedure: LEFT HEART CATH AND CORONARY ANGIOGRAPHY;  Surgeon: Marykay LexHarding, David W, MD;  [facility];  Service: Cardiovascular;  Laterality: N/A;  . TEE WITHOUT CARDIOVERSION N/A 09/08/2018   Procedure: TRANSESOPHAGEAL ECHOCARDIOGRAM (TEE);  Surgeon: Laurey MoraleMcLean, Dalton S, MD;  [facility];  Service: Cardiovascular;  Laterality: N/A;     reports that he has been smoking cigarettes. He has been smoking about 0.25 packs per day. He has never used smokeless tobacco. He reports previous alcohol use. He reports previous drug use.  Allergies  Allergen Reactions  . Aspirin Swelling    Family History  Problem Relation Age of Onset  . Hypertension Mother   . Stroke Mother   . Dementia Mother   . Hypertension Father   . Stroke Father   . Cancer Father   . Alcoholism Father   . Cancer Sister     Prior to Admission medications   Medication Sig Start Date End Date Taking? Authorizing Provider  carvedilol (COREG) 3.125 MG tablet Take 3.125 mg by mouth 2 (two) times daily with a meal.  Yes [provider]    Physical Exam: Vitals:   07/10/19 1728 07/10/19 1745 07/10/19 1815 07/10/19 2030  BP: 131/86 (!) 136/92 (!) 129/99   Pulse: 80     Resp: 19 17 13 16   Temp:      TempSrc:      SpO2: 99%     Weight:      Height:        Constitutional: Appears mildly lethargic, calm, comfortable, obese Vitals:   07/10/19 1728 07/10/19 1745 07/10/19 1815 07/10/19 2030  BP: 131/86 (!) 136/92 (!) 129/99   Pulse: 80     Resp: 19 17 13 16   Temp:       TempSrc:      SpO2: 99%     Weight:      Height:       Eyes: PERRL, lids and conjunctivae normal ENMT: Mucous membranes are moist. Posterior pharynx clear of any exudate or lesions. Neck: normal, supple, no masses, no thyromegaly Respiratory: clear to auscultation bilaterally, no wheezing, no crackles. Normal respiratory effort. No accessory muscle use.  Cardiovascular: Regular rate and rhythm, no murmurs / rubs / gallops. No extremity edema. 2+ pedal pulses.   Abdomen: no tenderness, no masses palpated. No hepatosplenomegaly. Bowel sounds positive.  Musculoskeletal: no clubbing / cyanosis. No joint deformity upper and lower extremities. Good ROM, no contractures. Normal muscle tone.  Skin: no rashes, lesions, ulcers. No induration Neurologic: Reduced prominence of right nasolabial fold at rest, speech slurred, otherwise other cranial nerves intact, strength bilateral upper extremities 4+/5, LLE 4+/5, RLE- 4/5.  Sensation intact. Psychiatric: Normal judgment and insight. Mildly lethargic , but oriented x 3. Normal mood.   Labs on Admission: I have personally reviewed following labs and imaging studies  CBC: Recent Labs  Lab 07/10/19 1704  WBC 8.8  NEUTROABS 5.9  HGB 14.3  HCT 42.5  MCV 94.4  PLT 937   Basic Metabolic Panel: Recent Labs  Lab 07/10/19 1704  NA 136  K 3.8  CL 102  CO2 22  GLUCOSE 116*  BUN 32*  CREATININE 2.38*  CALCIUM 9.1   Liver Function Tests: Recent Labs  Lab 07/10/19 1704  AST 13*  ALT 10  ALKPHOS 71  BILITOT 0.8  PROT 9.2*  ALBUMIN 3.9   Coagulation Profile: Recent Labs  Lab 07/10/19 1704  INR 1.1    Radiological Exams on Admission: Ct Head Wo Contrast  Result Date: 07/10/2019 CLINICAL DATA:  Slurred speech and loss of balance EXAM: CT HEAD WITHOUT CONTRAST TECHNIQUE: Contiguous axial images were obtained from the base of the skull through the vertex without intravenous contrast. COMPARISON:  09/08/2018 FINDINGS: Brain: Wedge-shaped  area decreased attenuation is noted in the superior aspect of the left cerebellum consistent with prior infarct. This corresponds to an area of prior ischemia on the previous exam. There are scattered areas of decreased attenuation in the periventricular white matter consistent with prior lacunar infarcts. These have increased in the interval from the prior exam. No findings to suggest acute infarct or acute hemorrhage are seen. Vascular: No hyperdense vessel or unexpected calcification. Skull: Normal. Negative for fracture or focal lesion. Sinuses/Orbits: No acute finding. Other: None. IMPRESSION: Area of prior infarction in the superior aspect of the left cerebellum. Areas of prior ischemia are noted. The changes on the left are new from the prior exam but consistent with more subacute to chronic ischemia. No evidence of acute hemorrhage noted. Electronically Signed   By: Linus Mako.D.  On: 07/10/2019 17:26    EKG: Independently reviewed.  Sinus rhythm.  Nonspecific T wave changes aVL V5 V6.  Assessment/Plan Principal Problem:   Slurred speech Active Problems:   Essential hypertension   CKD (chronic kidney disease), stage III (HCC) = Secondary to Hypertension   History of non-ST elevation myocardial infarction (NSTEMI)   Cardiomyopathy (HCC)    Slurred speech, right facial droop- unable to ambulate.  On my examination right lower extremity slightly weaker than left. Recent CVA 2019, with residual left-sided deficits.  At that time work-up included TTE that was benign and plan for 30-day event monitoring on discharge-no arrhythmia was detected on Holter monitor.  Objectively speech is still slurred, with slight right facial droop.  Head CT shows changes on the left new from prior exam, consistent with subacute to chronic ischemia.  Patient reports compliance with Plavix.  EDP talked to neurology, agreed with admission to [facility]. -MRI, MRA brain -Echocardiogram -Carotid Dopplers - HgbA1c,  Lipid panel a.m. -PT, OT, speech therapy evaluation -Allow for permissive hypertension -Resume Plavix and 5 mg daily, (aspirin allergy noted) -Lipitor 40 mg daily  Metabolic encephalopathy-reported mild lethargy- Improving per niece, likely 2/2 acute kidney injury and intracranial event. -Hydrate  Acute kidney injury on CKD 3-creatinine 2.38, baseline 1.7-1.8.  Likely prerenal. Weight over the past 6 months stable, but has significantly increased over the past 1 year. - Trial of gentle fluids, N/s 75cc/hr x 15hrs - BMP a.m  Hx of coronary artery disease, cardiomyopathy- NSTEMI with DES to mid circumflex 100% stenosis May 2019.  - Echo 02/2019 -40 to 45%, impaired diastolic relaxation.  Follows with Dr. Wyline Mood. -Continue home Plavix, resume statins  DVT prophylaxis: SCDs-for now pending MRI results to show extent of infarct Code Status: Full Family Communication: Niece at bedside Disposition Plan: Per rounding team Consults called: Neurology Admission status: Obseveration, telemetry   Onnie Boer MD [facility]  07/10/2019, 9:14 PM

## 2019-07-10 NOTE — Progress Notes (Signed)
[facility], implemented orders, paged Neurology, monitoring continues, call light in reach

## 2019-07-10 NOTE — ED Notes (Signed)
Patient tried voiding but he was unable to at this time

## 2019-07-10 NOTE — ED Triage Notes (Addendum)
Patient has been experiencing AMS, left facial droop, right sided weakness, balance issues and slurred speech x2 days. Pt has niece with him. Pt unable to communicate with staff appropriately.

## 2019-07-11 ENCOUNTER — Observation Stay (HOSPITAL_BASED_OUTPATIENT_CLINIC_OR_DEPARTMENT_OTHER): Payer: Medicaid Other

## 2019-07-11 ENCOUNTER — Observation Stay (HOSPITAL_COMMUNITY): Payer: Medicaid Other

## 2019-07-11 DIAGNOSIS — F17210 Nicotine dependence, cigarettes, uncomplicated: Secondary | ICD-10-CM | POA: Diagnosis present

## 2019-07-11 DIAGNOSIS — Z6841 Body Mass Index (BMI) 40.0 and over, adult: Secondary | ICD-10-CM | POA: Diagnosis not present

## 2019-07-11 DIAGNOSIS — N183 Chronic kidney disease, stage 3 (moderate): Secondary | ICD-10-CM | POA: Diagnosis present

## 2019-07-11 DIAGNOSIS — I252 Old myocardial infarction: Secondary | ICD-10-CM

## 2019-07-11 DIAGNOSIS — I130 Hypertensive heart and chronic kidney disease with heart failure and stage 1 through stage 4 chronic kidney disease, or unspecified chronic kidney disease: Secondary | ICD-10-CM | POA: Diagnosis present

## 2019-07-11 DIAGNOSIS — Z79899 Other long term (current) drug therapy: Secondary | ICD-10-CM | POA: Diagnosis not present

## 2019-07-11 DIAGNOSIS — R29711 NIHSS score 11: Secondary | ICD-10-CM | POA: Diagnosis present

## 2019-07-11 DIAGNOSIS — Z20828 Contact with and (suspected) exposure to other viral communicable diseases: Secondary | ICD-10-CM | POA: Diagnosis present

## 2019-07-11 DIAGNOSIS — I63232 Cerebral infarction due to unspecified occlusion or stenosis of left carotid arteries: Secondary | ICD-10-CM | POA: Diagnosis not present

## 2019-07-11 DIAGNOSIS — I5022 Chronic systolic (congestive) heart failure: Secondary | ICD-10-CM | POA: Diagnosis present

## 2019-07-11 DIAGNOSIS — R29810 Facial weakness: Secondary | ICD-10-CM | POA: Diagnosis present

## 2019-07-11 DIAGNOSIS — E669 Obesity, unspecified: Secondary | ICD-10-CM | POA: Diagnosis present

## 2019-07-11 DIAGNOSIS — I63132 Cerebral infarction due to embolism of left carotid artery: Secondary | ICD-10-CM

## 2019-07-11 DIAGNOSIS — R4182 Altered mental status, unspecified: Secondary | ICD-10-CM | POA: Diagnosis not present

## 2019-07-11 DIAGNOSIS — Z7902 Long term (current) use of antithrombotics/antiplatelets: Secondary | ICD-10-CM | POA: Diagnosis not present

## 2019-07-11 DIAGNOSIS — N179 Acute kidney failure, unspecified: Secondary | ICD-10-CM | POA: Diagnosis present

## 2019-07-11 DIAGNOSIS — G9341 Metabolic encephalopathy: Secondary | ICD-10-CM | POA: Diagnosis present

## 2019-07-11 DIAGNOSIS — E785 Hyperlipidemia, unspecified: Secondary | ICD-10-CM | POA: Diagnosis present

## 2019-07-11 DIAGNOSIS — R4701 Aphasia: Secondary | ICD-10-CM | POA: Diagnosis present

## 2019-07-11 DIAGNOSIS — I639 Cerebral infarction, unspecified: Secondary | ICD-10-CM

## 2019-07-11 DIAGNOSIS — R4781 Slurred speech: Secondary | ICD-10-CM

## 2019-07-11 DIAGNOSIS — Z955 Presence of coronary angioplasty implant and graft: Secondary | ICD-10-CM | POA: Diagnosis not present

## 2019-07-11 DIAGNOSIS — I69351 Hemiplegia and hemiparesis following cerebral infarction affecting right dominant side: Secondary | ICD-10-CM | POA: Diagnosis not present

## 2019-07-11 DIAGNOSIS — I429 Cardiomyopathy, unspecified: Secondary | ICD-10-CM | POA: Diagnosis present

## 2019-07-11 DIAGNOSIS — I2510 Atherosclerotic heart disease of native coronary artery without angina pectoris: Secondary | ICD-10-CM | POA: Diagnosis present

## 2019-07-11 DIAGNOSIS — Z9114 Patient's other noncompliance with medication regimen: Secondary | ICD-10-CM | POA: Diagnosis not present

## 2019-07-11 DIAGNOSIS — I6340 Cerebral infarction due to embolism of unspecified cerebral artery: Secondary | ICD-10-CM | POA: Insufficient documentation

## 2019-07-11 DIAGNOSIS — G8191 Hemiplegia, unspecified affecting right dominant side: Secondary | ICD-10-CM | POA: Diagnosis present

## 2019-07-11 DIAGNOSIS — R4702 Dysphasia: Secondary | ICD-10-CM | POA: Diagnosis present

## 2019-07-11 DIAGNOSIS — Z823 Family history of stroke: Secondary | ICD-10-CM | POA: Diagnosis not present

## 2019-07-11 LAB — LIPID PANEL
Cholesterol: 141 mg/dL (ref 0–200)
HDL: 28 mg/dL — ABNORMAL LOW (ref >40)
LDL Cholesterol: 90 mg/dL (ref 0–99)
Total CHOL/HDL Ratio: 5 RATIO
Triglycerides: 117 mg/dL (ref <150)
VLDL: 23 mg/dL (ref 0–40)

## 2019-07-11 LAB — BASIC METABOLIC PANEL
Anion gap: 10 (ref 5–15)
BUN: 23 mg/dL — ABNORMAL HIGH (ref 6–20)
CO2: 22 mmol/L (ref 22–32)
Calcium: 9 mg/dL (ref 8.9–10.3)
Chloride: 106 mmol/L (ref 98–111)
Creatinine, Ser: 1.8 mg/dL — ABNORMAL HIGH (ref 0.61–1.24)
GFR calc Af Amer: 50 mL/min — ABNORMAL LOW (ref >60)
GFR calc non Af Amer: 44 mL/min — ABNORMAL LOW (ref >60)
Glucose, Bld: 116 mg/dL — ABNORMAL HIGH (ref 70–99)
Potassium: 3.8 mmol/L (ref 3.5–5.1)
Sodium: 138 mmol/L (ref 135–145)

## 2019-07-11 LAB — ECHOCARDIOGRAM COMPLETE
Height: 72 in
Weight: 5185.22 oz

## 2019-07-11 LAB — HEMOGLOBIN A1C
Hgb A1c MFr Bld: 5.6 % (ref 4.8–5.6)
Mean Plasma Glucose: 114.02 mg/dL

## 2019-07-11 LAB — HIV ANTIBODY (ROUTINE TESTING W REFLEX): HIV Screen 4th Generation wRfx: NONREACTIVE

## 2019-07-11 MED ORDER — PERFLUTREN LIPID MICROSPHERE
1.0000 mL | INTRAVENOUS | Status: AC | PRN
  Administered 2019-07-11: 2 mL via INTRAVENOUS
  Filled 2019-07-11: qty 10

## 2019-07-11 NOTE — Progress Notes (Addendum)
STROKE TEAM PROGRESS NOTE   SUBJECTIVE (INTERVAL HISTORY) He is lying in bed, severe expressive aphasia and right hemiparesis. CUS showed left ICA occlusion, new from last stroke. MRI showed left scattered MCA and ACA infarcts.   OBJECTIVE Vitals:   07/11/19 0356 07/11/19 0436 07/11/19 0605 07/11/19 0933  BP: (!) 142/86 (!) 155/86 (!) 135/91 (!) 142/90  Pulse: 70 68 (!) 55 (!) 59  Resp:  18 16 16   Temp: 98.5 F (36.9 C) 98.9 F (37.2 C) 97.9 F (36.6 C) 98 F (36.7 C)  TempSrc: Oral Oral Oral Oral  SpO2: 100% 97% 99% 100%  Weight:      Height:        CBC:  Recent Labs  Lab 07/10/19 1704  WBC 8.8  NEUTROABS 5.9  HGB 14.3  HCT 42.5  MCV 94.4  PLT 240    Basic Metabolic Panel:  Recent Labs  Lab 07/10/19 1704  NA 136  K 3.8  CL 102  CO2 22  GLUCOSE 116*  BUN 32*  CREATININE 2.38*  CALCIUM 9.1    Lipid Panel:     Component Value Date/Time   CHOL 141 07/11/2019 0732   TRIG 117 07/11/2019 0732   HDL 28 (L) 07/11/2019 0732   CHOLHDL 5.0 07/11/2019 0732   VLDL 23 07/11/2019 0732   LDLCALC 90 07/11/2019 0732   HgbA1c:  Lab Results  Component Value Date   HGBA1C 5.6 07/11/2019   Urine Drug Screen:     Component Value Date/Time   LABOPIA NONE DETECTED 07/10/2019 2250   COCAINSCRNUR NONE DETECTED 07/10/2019 2250   LABBENZ NONE DETECTED 07/10/2019 2250   AMPHETMU NONE DETECTED 07/10/2019 2250   THCU POSITIVE (A) 07/10/2019 2250   LABBARB NONE DETECTED 07/10/2019 2250    Alcohol Level     Component Value Date/Time   ETH <10 07/10/2019 1704    IMAGING  Ct Head Wo Contrast 07/10/2019 IMPRESSION:  Area of prior infarction in the superior aspect of the left cerebellum. Areas of prior ischemia are noted. The changes on the left are new from the prior exam but consistent with more subacute to chronic ischemia. No evidence of acute hemorrhage noted.  [name]  Result Date: 07/11/2019 CLINICAL DATA:  Altered mental status. Left facial  droop. Right-sided weakness. Symptoms for 2 days. EXAM: MRI HEAD WITHOUT CONTRAST MRA HEAD WITHOUT CONTRAST TECHNIQUE: Multiplanar, multiecho pulse sequences of the brain and surrounding structures were obtained without intravenous contrast. Angiographic images of the head were obtained using MRA technique without contrast. COMPARISON:  CT head without contrast 07/10/2019. CTA of the head and neck I/23/19. MRI brain and MRA head 09/07/2018. FINDINGS: MRI HEAD FINDINGS Brain: The diffusion-weighted images demonstrate scattered areas of acute nonhemorrhagic infarct within the right frontal and parietal white matter. There is involvement of the left centrum semi ovale and corona radiata extending anteriorly into border zone regions. Additional punctate cortical areas are present in the left parietal lobe and along the cingulate gyrus. There is no significant involvement of the right hemisphere. There is T2 signal changes associated with the areas of acute/subacute infarction. Remote lacunar infarct of the right centrum semi ovale is noted. Periventricular white matter changes are present on the right as well. The brainstem is normal. Remote left cerebellar infarct is again seen. Cerebellum is otherwise within normal limits. The internal auditory canals are within normal limits. Vascular: Abnormal signal is present within the vertebral arteries at the foramen magnum bilaterally. There is flow in  the distal basilar artery. There is no flow in the left internal carotid artery. Skull and upper cervical spine: Mild degenerative changes are present the upper cervical spine. Skull base is normal. Midline structures are otherwise within normal limits. Sinuses/Orbits: The paranasal sinuses and mastoid air cells are clear. The globes and orbits are within normal limits. MRA HEAD FINDINGS The right internal carotid artery is within normal limits from the high cervical segments through the ICA termini. A prominent posterior  communicating artery is patent. The left internal carotid artery is occluded proximally. Flow is reconstituted in the distal left ICA at the level of the posterior communicating artery. A1 and M1 segments are normal. There is reduced size of left MCA branch vessels compared to the right without a significant focal stenosis or occlusion. There is signal loss in the V4 segments of the vertebral arteries bilaterally, more proximally on the left. There is no flow signal within the proximal basilar artery. There is flow in the distal basilar artery. A prominent right posterior communicating artery is present. Moderate narrowing is present in the left P2 segment. There is mild irregularity of distal PCA branch vessels without a focal stenosis or occlusion. IMPRESSION: 1. Occluded left internal carotid artery with reconstitution of the level of the left posterior communicating artery. Of note, there is narrowing within the left posterior communicating artery. 2. No significant stenosis or occlusion of the right internal carotid artery with a prominent right posterior communicating artery that is feeding the posterior circulation. 3. High-grade stenosis or occlusion of distal vertebral arteries bilaterally and of the proximal basilar artery. Flow is reconstituted in the distal basilar artery, presumably through the right posterior communicating artery. 4. Asymmetric attenuation of left MCA branch vessels without a focal stenosis or protrusion. This likely to represents the proximal occlusion decreased perfusion pressure due. 5. Extensive left hemisphere acute/subacute white matter infarcts involving the left centrum semi ovale and corona radiata as well as border zone areas. This corresponds with the proximal left ICA occlusion. 6. There are scattered punctate foci of cortical infarct involving the anterior left frontal lobe and left parietal lobe. There is also some involvement of the cingulate gyrus. Electronically Signed    By: Marin Robertshristopher  Mattern M.D.   On: 07/11/2019 16:03   [name]  Result Date: 07/11/2019 CLINICAL DATA:  Altered mental status. Left facial droop. Right-sided weakness. Symptoms for 2 days. EXAM: MRI HEAD WITHOUT CONTRAST MRA HEAD WITHOUT CONTRAST TECHNIQUE: Multiplanar, multiecho pulse sequences of the brain and surrounding structures were obtained without intravenous contrast. Angiographic images of the head were obtained using MRA technique without contrast. COMPARISON:  CT head without contrast 07/10/2019. CTA of the head and neck I/23/19. MRI brain and MRA head 09/07/2018. FINDINGS: MRI HEAD FINDINGS Brain: The diffusion-weighted images demonstrate scattered areas of acute nonhemorrhagic infarct within the right frontal and parietal white matter. There is involvement of the left centrum semi ovale and corona radiata extending anteriorly into border zone regions. Additional punctate cortical areas are present in the left parietal lobe and along the cingulate gyrus. There is no significant involvement of the right hemisphere. There is T2 signal changes associated with the areas of acute/subacute infarction. Remote lacunar infarct of the right centrum semi ovale is noted. Periventricular white matter changes are present on the right as well. The brainstem is normal. Remote left cerebellar infarct is again seen. Cerebellum is otherwise within normal limits. The internal auditory canals are within normal limits. Vascular: Abnormal signal is present  within the vertebral arteries at the foramen magnum bilaterally. There is flow in the distal basilar artery. There is no flow in the left internal carotid artery. Skull and upper cervical spine: Mild degenerative changes are present the upper cervical spine. Skull base is normal. Midline structures are otherwise within normal limits. Sinuses/Orbits: The paranasal sinuses and mastoid air cells are clear. The globes and orbits are within normal limits. MRA  HEAD FINDINGS The right internal carotid artery is within normal limits from the high cervical segments through the ICA termini. A prominent posterior communicating artery is patent. The left internal carotid artery is occluded proximally. Flow is reconstituted in the distal left ICA at the level of the posterior communicating artery. A1 and M1 segments are normal. There is reduced size of left MCA branch vessels compared to the right without a significant focal stenosis or occlusion. There is signal loss in the V4 segments of the vertebral arteries bilaterally, more proximally on the left. There is no flow signal within the proximal basilar artery. There is flow in the distal basilar artery. A prominent right posterior communicating artery is present. Moderate narrowing is present in the left P2 segment. There is mild irregularity of distal PCA branch vessels without a focal stenosis or occlusion. IMPRESSION: 1. Occluded left internal carotid artery with reconstitution of the level of the left posterior communicating artery. Of note, there is narrowing within the left posterior communicating artery. 2. No significant stenosis or occlusion of the right internal carotid artery with a prominent right posterior communicating artery that is feeding the posterior circulation. 3. High-grade stenosis or occlusion of distal vertebral arteries bilaterally and of the proximal basilar artery. Flow is reconstituted in the distal basilar artery, presumably through the right posterior communicating artery. 4. Asymmetric attenuation of left MCA branch vessels without a focal stenosis or protrusion. This likely to represents the proximal occlusion decreased perfusion pressure due. 5. Extensive left hemisphere acute/subacute white matter infarcts involving the left centrum semi ovale and corona radiata as well as border zone areas. This corresponds with the proximal left ICA occlusion. 6. There are scattered punctate foci of  cortical infarct involving the anterior left frontal lobe and left parietal lobe. There is also some involvement of the cingulate gyrus. Electronically Signed   By: Marin Roberts M.D.   On: 07/11/2019 16:03   Vas US Carotid  Result Date: 07/11/2019 Carotid Arterial Duplex Study Indications:       CVA, Speech disturbance and Weakness. Risk Factors:      Hypertension, prior MI, coronary artery disease. Other Factors:     CVA 08/2018. Comparison Study:  No prior study on file for comparison. Performing Technologist: Sherren Kerns RVS  Examination Guidelines: A complete evaluation includes B-mode imaging, spectral Doppler, color Doppler, and power Doppler as needed of all accessible portions of each vessel. Bilateral testing is considered an integral part of a complete examination. Limited examinations for reoccurring indications may be performed as noted.  Right Carotid Findings: +----------+--------+--------+--------+-----------+------------------+           PSV cm/sEDV cm/sStenosisDescribe   Comments           +----------+--------+--------+--------+-----------+------------------+ CCA Prox  109     23                         intimal thickening +----------+--------+--------+--------+-----------+------------------+ CCA Distal101     26  intimal thickening +----------+--------+--------+--------+-----------+------------------+ ICA Prox  74      35              homogeneous                   +----------+--------+--------+--------+-----------+------------------+ ICA Distal73      33                                            +----------+--------+--------+--------+-----------+------------------+ ECA       75      13                                            +----------+--------+--------+--------+-----------+------------------+ +----------+--------+-------+--------+-------------------+           PSV cm/sEDV cmsDescribeArm Pressure (mmHG)  +----------+--------+-------+--------+-------------------+ [id number]                                         +----------+--------+-------+--------+-------------------+ +---------+--------+--+--------+-+ VertebralPSV cm/s26EDV cm/s9 +---------+--------+--+--------+-+  Left Carotid Findings: +----------+--------+--------+--------+--------+-----------------------+           PSV cm/sEDV cm/sStenosisDescribeComments                +----------+--------+--------+--------+--------+-----------------------+ CCA Prox  82      14                      intimal thickening      +----------+--------+--------+--------+--------+-----------------------+ CCA Distal62      17                      intimal thickening      +----------+--------+--------+--------+--------+-----------------------+ ICA Prox                  Occluded        soft plaque vs thrombus +----------+--------+--------+--------+--------+-----------------------+ ECA       139     26                                              +----------+--------+--------+--------+--------+-----------------------+ +----------+--------+--------+--------+-------------------+ SubclavianPSV cm/sEDV cm/sDescribeArm Pressure (mmHG) +----------+--------+--------+--------+-------------------+           121                                         +----------+--------+--------+--------+-------------------+ +---------+--------+--+--------+-+ VertebralPSV cm/s55EDV cm/s4 +---------+--------+--+--------+-+  Summary: Right Carotid: The extracranial vessels were near-normal with only minimal wall                thickening or plaque. Left Carotid: The CCA appears occluded. Vertebrals:  Bilateral vertebral arteries demonstrate antegrade flow. Subclavians: Normal flow hemodynamics were seen in bilateral subclavian              arteries. *See table(s) above for measurements and observations.     Preliminary     Transthoracic  Echocardiogram   1. The left ventricle has a visually estimated ejection fraction of 40%. The cavity size was normal. There is mildly increased left ventricular wall thickness.  Left ventricular diastolic Doppler parameters are consistent with impaired relaxation. No  obvious, formed LV thrombus seen with Definity contrast.  2. There is akinesis of the left ventricular, basal-mid inferolateral wall and inferior wall.  3. The right ventricle has normal systolic function. The cavity was normal. There is no increase in right ventricular wall thickness.  4. The mitral valve is grossly normal.  5. The tricuspid valve is grossly normal.  6. The aortic valve was not well visualized.  7. The aorta is normal in size and structure.  8. The interatrial septum was not well visualized.   ECG - SR rate 89 BPM. (See cardiology reading for complete details)    PHYSICAL EXAM  Temp:  [97.9 F (36.6 C)-99.4 F (37.4 C)] 98 F (36.7 C) (07/25 0933) Pulse Rate:  [55-80] 59 (07/25 0933) Resp:  [13-19] 16 (07/25 0933) BP: (120-155)/(76-99) 142/90 (07/25 0933) SpO2:  [20 %-100 %] 100 % (07/25 0933) Weight:  [509 kg] 147 kg (07/24 1654)  General - morbid obesity, well developed, in no apparent distress.  Ophthalmologic - fundi not visualized due to noncooperation.  Cardiovascular - Regular rate and rhythm.  Neuro - awake alert, eyes open, not able to answer orientation questions but following simple commands. Severe expressive aphasia, barely any words out, severe dysarthria with intangible words. Not able to name or repeat. No gaze preference, tracking bilaterally, visual field full, PERRL, right facial droop, tongue midline. Right UE 3-/5 and RLE 3/5, LUE and LLE at least 4/5. Sensation subjectively symmetrical. FTN bilaterally intact but right slow in action. Gait not tested.   ASSESSMENT/PLAN [name] is a 48 y.o. male with history of ongoing tobacco use, CAD w stent / Mi, hypertension,  previous stroke and non compliant with medications after losing his insurance coverage, who was found to be much more dysarthric with right facial droop more than typical . He did not receive IV t-PA due to late presentation (>4.5 hours from time of onset)  Stroke:  Left MCA and ACA scattered infarct due to left ICA occlusion, large vessel disease   Resultant  Expressive aphasia with right hemiparesis  CT head - Area of prior infarction in the superior aspect of the left cerebellum  MRI head - Extensive left hemisphere acute/subacute white matter infarcts involving the left centrum semi ovale and corona radiata as well as border zone areas.  MRA head - left ICA occlusion, b/l VA distal occlusion, BA proximal occlusion with distal retrograde recon from PCOMs  Carotid Doppler - left ICA/CCA occlusion  2D Echo - EF 40%  Sars Corona Virus 2 - negative  LDL - 90  HgbA1c - 5.6  UDS - THCU  VTE prophylaxis - SCDs  Diet - Heart healthy with thin liquids.  No antithrombotic prior to admission, now on clopidogrel 75 mg daily. Pt allergic to ASA. Continue plavix on discharge.  Patient counseled to be compliant with his antithrombotic medications  Ongoing aggressive stroke risk factor management  Therapy recommendations:  CIR  Disposition:  Pending  Extra and intracranial stenosis/occlusion  08/2018 CTA head and neck unremarkable  However, this admission, MRA and carotid Doppler showed left ICA/CCA occlusion, bilateral VA distal occlusion, BA proximal occlusion  Consistent with large vessel disease  On Plavix  History of stroke  08/2018 CT showed left superior cerebellar infarct and old right BG infarct.  MRI confirmed left SCA infarct and old right CR/BG left pontine and left caudate head infarcts.  UDS showed THC positive.  CT head and neck unremarkable.  LDL 81 and A1c 5.4.  Creatinine 1.9.  TTE showed EF 35 to 40%.  TEE EF 45 to 50% with lateral hypokinesis, no PFO.   Hypercoagulable work-up unremarkable.  Put on Plavix and high-dose Lipitor.  Outpatient 30-day CardioNet monitoring showed no A. Fib.  CAD with cardiomyopathy  non-STEMI status post stent in 04/2018. Not able to tolerate aspirin due to allergy, not able to tolerating Brilinta due to erectile dysfunction, not taking effient due to financial difficulty.  On coreg PTA  EF 40%  Likely noncompliant with medication  Tobacco abuse  Current heavy smoker  Continues to smoke after STEMI and stroke  Smoking cessation counseling provided  Pt is willing to quit  AKI on CKD  Creatinine 2.38, baseline 1.9  IV fluid  BMP monitoring  Hypertension  Stable . Permissive hypertension (OK if < 220/120) but gradually normalize in 5-7 days . Long-term BP goal normotensive  Hyperlipidemia  Lipid lowering medication PTA:  none  LDL 90, goal < 70  Current lipid lowering medication: Lipitor 40 mg daily  Continue statin at discharge  Other Stroke Risk Factors  Cigarette smoker - advised to stop smoking  Previous ETOH use - Previous drug use  Obesity, Body mass index is 43.95 kg/m., recommend weight loss, diet and exercise as appropriate   Family hx stroke (mother and father)  Medication noncompliance  Other Active Problems  Aspirin allergy "swelling"  Hospital day # 0  I spent  35 minutes in total face-to-face time with the patient, more than 50% of which was spent in counseling and coordination of care, reviewing test results, images and medication, and discussing the diagnosis of left MCA and ACA infarct, left ICA occlusion, basal artery occlusion, treatment plan and potential prognosis. This patient's care requiresreview of multiple databases, neurological assessment, discussion with family, other specialists and medical decision making of high complexity.  Marvel PlanJindong Geetika Laborde, MD PhD Stroke Neurology 07/11/2019 5:00 PM    To contact Stroke Continuity provider, please refer to  [email or web address]. After hours, contact General Neurology

## 2019-07-11 NOTE — Progress Notes (Signed)
SLP Cancellation Note  Patient Details Name: Zachary Mccann MRN: [id number] DOB: [birth year]   Cancelled treatment:         Pt going for MRI. Will see at available time.    Charlynne Cousins Ameerah Huffstetler 07/11/2019, 1:14 PM

## 2019-07-11 NOTE — Progress Notes (Signed)
  Echocardiogram 2D Echocardiogram has been performed.  Bev Drennen L Androw 07/11/2019, 11:26 AM

## 2019-07-11 NOTE — Progress Notes (Addendum)
PROGRESS NOTE    Zachary Mccann  EAV:[id number] DOB: 1971/01/26 DOA: 07/10/2019 PCP: [name], NP   Brief Narrative: Zachary Mccann is a 48 y.o. male with medical history significant for CVA, hypertension, CKD 3, coronary artery disease. Patient presented secondary to slurred speech, facial droop and lethargy and presents with symptoms likely related to acute stroke.   Assessment & Plan:   Principal Problem:   Slurred speech Active Problems:   Essential hypertension   CKD (chronic kidney disease), stage III (HCC) = Secondary to Hypertension   History of non-ST elevation myocardial infarction (NSTEMI)   Cardiomyopathy (HCC)   Slurred speech Right hemiplegia Likely stroke based on clinical exam. MRI pending. Neurology on board. LDL of 90, hemoglobin A1C of 5.6%. No thrombus seen on Transthoracic Echocardiogram. -MRI/MRA pending -PT/OT recommendations: CIR -SLP recommendations pending -Continue Lipitor, Plavix -Neurology recommendations  Metabolic encephalopathy Thought secondary to kidney injury vs intracerebral injury.   Acute kidney on CKD stage III Unknown etiology. Possibly prerenal. Given IV fluids. Baseline creatinine of 1.8-1.9. Creatinine of 2.38 on admission.  CAD Chronic systolic heart failure History of NSTEMI with DES placement. On Plavix -Continue Lipitor and Plavix   DVT prophylaxis: SCDs Code Status:   Code Status: Full Code Family Communication: None at bedside Disposition Plan: Discharge pending neuro workup   Consultants:   Neurology  Procedures:   Transthoracic Echocardiogram (07/11/2019) IMPRESSIONS    1. The left ventricle has a visually estimated ejection fraction of 40%. The cavity size was normal. There is mildly increased left ventricular wall thickness. Left ventricular diastolic Doppler parameters are consistent with impaired relaxation. No  obvious, formed LV thrombus seen with Definity contrast.  2. There is akinesis of  the left ventricular, basal-mid inferolateral wall and inferior wall.  3. The right ventricle has normal systolic function. The cavity was normal. There is no increase in right ventricular wall thickness.  4. The mitral valve is grossly normal.  5. The tricuspid valve is grossly normal.  6. The aortic valve was not well visualized.  7. The aorta is normal in size and structure.  8. The interatrial septum was not well visualized.  Antimicrobials:  None    Subjective: Patient unable to communicate well  Objective: Vitals:   07/11/19 0356 07/11/19 0436 07/11/19 0605 07/11/19 0933  BP: (!) 142/86 (!) 155/86 (!) 135/91 (!) 142/90  Pulse: 70 68 (!) 55 (!) 59  Resp:  Temp: 98.5 F (36.9 C) 98.9 F (37.2 C) 97.9 F (36.6 C) 98 F (36.7 C)  TempSrc: Oral Oral Oral Oral  SpO2: 100% 97% 99% 100%  Weight:      Height:        Intake/Output Summary (Last 24 hours) at 07/11/2019 1607 Last data filed at 07/11/2019 0500 Gross per 24 hour  Intake 558.62 ml  Output --  Net 558.62 ml   Filed Weights   07/10/19 1654  Weight: (!) 147 kg    Examination:  General exam: Appears calm and comfortable Respiratory system: Clear to auscultation. Respiratory effort normal. Cardiovascular system: S1 & S2 heard, RRR. No murmurs, rubs, gallops or clicks. Gastrointestinal system: Abdomen is nondistended, soft and nontender. No organomegaly or masses felt. Normal bowel sounds heard. Central nervous system: Alert and oriented. 3/5 upper/lower extremity strength. Aphasia. Mild right facial droop Extremities: No edema. No calf tenderness Skin: No cyanosis. No rashes Psychiatry: Judgement and insight appear normal. Mood & affect appropriate.     Data Reviewed: I  have personally reviewed following labs and imaging studies  CBC: Recent Labs  Lab 07/10/19 1704  WBC 8.8  NEUTROABS 5.9  HGB 14.3  HCT 42.5  MCV 94.4  PLT 240   Basic Metabolic Panel: Recent Labs  Lab 07/10/19 1704   NA 136  K 3.8  CL 102  CO2 22  GLUCOSE 116*  BUN 32*  CREATININE 2.38*  CALCIUM 9.1   GFR: Estimated Creatinine Clearance: 56.6 mL/min (A) (by C-G formula based on SCr of 2.38 mg/dL (H)). Liver Function Tests: Recent Labs  Lab 07/10/19 1704  AST 13*  ALT 10  ALKPHOS 71  BILITOT 0.8  PROT 9.2*  ALBUMIN 3.9   No results for input(s): LIPASE, AMYLASE in the last 168 hours. No results for input(s): AMMONIA in the last 168 hours. Coagulation Profile: Recent Labs  Lab 07/10/19 1704  INR 1.1   Cardiac Enzymes: No results for input(s): CKTOTAL, CKMB, CKMBINDEX, TROPONINI in the last 168 hours. BNP (last 3 results) No results for input(s): PROBNP in the last 8760 hours. HbA1C: Recent Labs    07/11/19 0732  HGBA1C 5.6   CBG: No results for input(s): GLUCAP in the last 168 hours. Lipid Profile: Recent Labs    07/11/19 0732  CHOL 141  HDL 28*  LDLCALC 90  TRIG 161117  CHOLHDL 5.0   Thyroid Function Tests: No results for input(s): TSH, T4TOTAL, FREET4, T3FREE, THYROIDAB in the last 72 hours. Anemia Panel: No results for input(s): VITAMINB12, FOLATE, FERRITIN, TIBC, IRON, RETICCTPCT in the last 72 hours. Sepsis Labs: No results for input(s): PROCALCITON, LATICACIDVEN in the last 168 hours.  Recent Results (from the past 240 hour(s))  SARS Coronavirus 2 (CEPHEID - [facility]formed [facility]), Hosp Order     Status: None   Collection Time: 07/10/19  5:25 PM   Specimen: Nasopharyngeal Swab  Result Value Ref Range Status   SARS Coronavirus 2 NEGATIVE NEGATIVE Final    Comment: (NOTE) If result is NEGATIVE SARS-CoV-2 target nucleic acids are NOT DETECTED. The SARS-CoV-2 RNA is generally detectable in upper and lower  respiratory specimens during the acute phase of infection. The lowest  concentration of SARS-CoV-2 viral copies this assay can detect is 250  copies / mL. A negative result does not preclude SARS-CoV-2 infection  and should not be used as  the sole basis for treatment or other  patient management decisions.  A negative result may occur with  improper specimen collection / handling, submission of specimen other  than nasopharyngeal swab, presence of viral mutation(s) within the  areas targeted by this assay, and inadequate number of viral copies  (<250 copies / mL). A negative result must be combined with clinical  observations, patient history, and epidemiological information. If result is POSITIVE SARS-CoV-2 target nucleic acids are DETECTED. The SARS-CoV-2 RNA is generally detectable in upper and lower  respiratory specimens dur ing the acute phase of infection.  Positive  results are indicative of active infection with SARS-CoV-2.  Clinical  correlation with patient history and other diagnostic information is  necessary to determine patient infection status.  Positive results do  not rule out bacterial infection or co-infection with other viruses. If result is PRESUMPTIVE POSTIVE SARS-CoV-2 nucleic acids MAY BE PRESENT.   A presumptive positive result was obtained on the submitted specimen  and confirmed on repeat testing.  While 2019 novel coronavirus  (SARS-CoV-2) nucleic acids may be present in the submitted sample  additional confirmatory testing may be necessary for  epidemiological  and / or clinical management purposes  to differentiate between  SARS-CoV-2 and other Sarbecovirus currently known to infect humans.  If clinically indicated additional testing with an alternate test  methodology [phone]) is advised. The SARS-CoV-2 RNA is generally  detectable in upper and lower respiratory sp ecimens during the acute  phase of infection. The expected result is Negative. Fact Sheet for Patients:  BoilerBrush.com.cy Fact Sheet for Healthcare Providers: https://pope.com/ This test is not yet approved or cleared by the [place] FDA and has been authorized for  detection and/or diagnosis of SARS-CoV-2 by FDA under an Emergency Use Authorization (EUA).  This EUA will remain in effect (meaning this test can be used) for the duration of the COVID-19 declaration under Section 564(b)(1) of the Act, 21 U.S.C. section 360bbb-3(b)(1), unless the authorization is terminated or revoked sooner. Performed at [facility], [address] [place], [place] [address]          Radiology Studies: Ct Head Wo Contrast  Result Date: 07/10/2019 CLINICAL DATA:  Slurred speech and loss of balance EXAM: CT HEAD WITHOUT CONTRAST TECHNIQUE: Contiguous axial images were obtained from the base of the skull through the vertex without intravenous contrast. COMPARISON:  09/08/2018 FINDINGS: Brain: Wedge-shaped area decreased attenuation is noted in the superior aspect of the left cerebellum consistent with prior infarct. This corresponds to an area of prior ischemia on the previous exam. There are scattered areas of decreased attenuation in the periventricular white matter consistent with prior lacunar infarcts. These have increased in the interval from the prior exam. No findings to suggest acute infarct or acute hemorrhage are seen. Vascular: No hyperdense vessel or unexpected calcification. Skull: Normal. Negative for fracture or focal lesion. Sinuses/Orbits: No acute finding. Other: None. IMPRESSION: Area of prior infarction in the superior aspect of the left cerebellum. Areas of prior ischemia are noted. The changes on the left are new from the prior exam but consistent with more subacute to chronic ischemia. No evidence of acute hemorrhage noted. Electronically Signed   By: Alcide Clever M.D.   On: 07/10/2019 17:26   [name]  Result Date: 07/11/2019 CLINICAL DATA:  Altered mental status. Left facial droop. Right-sided weakness. Symptoms for 2 days. EXAM: MRI HEAD WITHOUT CONTRAST MRA HEAD WITHOUT CONTRAST TECHNIQUE: Multiplanar, multiecho pulse sequences of the  brain and surrounding structures were obtained without intravenous contrast. Angiographic images of the head were obtained using MRA technique without contrast. COMPARISON:  CT head without contrast 07/10/2019. CTA of the head and neck I/23/19. MRI brain and MRA head 09/07/2018. FINDINGS: MRI HEAD FINDINGS Brain: The diffusion-weighted images demonstrate scattered areas of acute nonhemorrhagic infarct within the right frontal and parietal white matter. There is involvement of the left centrum semi ovale and corona radiata extending anteriorly into border zone regions. Additional punctate cortical areas are present in the left parietal lobe and along the cingulate gyrus. There is no significant involvement of the right hemisphere. There is T2 signal changes associated with the areas of acute/subacute infarction. Remote lacunar infarct of the right centrum semi ovale is noted. Periventricular white matter changes are present on the right as well. The brainstem is normal. Remote left cerebellar infarct is again seen. Cerebellum is otherwise within normal limits. The internal auditory canals are within normal limits. Vascular: Abnormal signal is present within the vertebral arteries at the foramen magnum bilaterally. There is flow in the distal basilar artery. There is no flow in the left internal carotid artery.  Skull and upper cervical spine: Mild degenerative changes are present the upper cervical spine. Skull base is normal. Midline structures are otherwise within normal limits. Sinuses/Orbits: The paranasal sinuses and mastoid air cells are clear. The globes and orbits are within normal limits. MRA HEAD FINDINGS The right internal carotid artery is within normal limits from the high cervical segments through the ICA termini. A prominent posterior communicating artery is patent. The left internal carotid artery is occluded proximally. Flow is reconstituted in the distal left ICA at the level of the posterior  communicating artery. A1 and M1 segments are normal. There is reduced size of left MCA branch vessels compared to the right without a significant focal stenosis or occlusion. There is signal loss in the V4 segments of the vertebral arteries bilaterally, more proximally on the left. There is no flow signal within the proximal basilar artery. There is flow in the distal basilar artery. A prominent right posterior communicating artery is present. Moderate narrowing is present in the left P2 segment. There is mild irregularity of distal PCA branch vessels without a focal stenosis or occlusion. IMPRESSION: 1. Occluded left internal carotid artery with reconstitution of the level of the left posterior communicating artery. Of note, there is narrowing within the left posterior communicating artery. 2. No significant stenosis or occlusion of the right internal carotid artery with a prominent right posterior communicating artery that is feeding the posterior circulation. 3. High-grade stenosis or occlusion of distal vertebral arteries bilaterally and of the proximal basilar artery. Flow is reconstituted in the distal basilar artery, presumably through the right posterior communicating artery. 4. Asymmetric attenuation of left MCA branch vessels without a focal stenosis or protrusion. This likely to represents the proximal occlusion decreased perfusion pressure due. 5. Extensive left hemisphere acute/subacute white matter infarcts involving the left centrum semi ovale and corona radiata as well as border zone areas. This corresponds with the proximal left ICA occlusion. 6. There are scattered punctate foci of cortical infarct involving the anterior left frontal lobe and left parietal lobe. There is also some involvement of the cingulate gyrus. Electronically Signed   By: Marin Robertshristopher  Mattern M.D.   On: 07/11/2019 16:03   [name]  Result Date: 07/11/2019 CLINICAL DATA:  Altered mental status. Left facial  droop. Right-sided weakness. Symptoms for 2 days. EXAM: MRI HEAD WITHOUT CONTRAST MRA HEAD WITHOUT CONTRAST TECHNIQUE: Multiplanar, multiecho pulse sequences of the brain and surrounding structures were obtained without intravenous contrast. Angiographic images of the head were obtained using MRA technique without contrast. COMPARISON:  CT head without contrast 07/10/2019. CTA of the head and neck I/23/19. MRI brain and MRA head 09/07/2018. FINDINGS: MRI HEAD FINDINGS Brain: The diffusion-weighted images demonstrate scattered areas of acute nonhemorrhagic infarct within the right frontal and parietal white matter. There is involvement of the left centrum semi ovale and corona radiata extending anteriorly into border zone regions. Additional punctate cortical areas are present in the left parietal lobe and along the cingulate gyrus. There is no significant involvement of the right hemisphere. There is T2 signal changes associated with the areas of acute/subacute infarction. Remote lacunar infarct of the right centrum semi ovale is noted. Periventricular white matter changes are present on the right as well. The brainstem is normal. Remote left cerebellar infarct is again seen. Cerebellum is otherwise within normal limits. The internal auditory canals are within normal limits. Vascular: Abnormal signal is present within the vertebral arteries at the foramen magnum bilaterally. There is flow in the  distal basilar artery. There is no flow in the left internal carotid artery. Skull and upper cervical spine: Mild degenerative changes are present the upper cervical spine. Skull base is normal. Midline structures are otherwise within normal limits. Sinuses/Orbits: The paranasal sinuses and mastoid air cells are clear. The globes and orbits are within normal limits. MRA HEAD FINDINGS The right internal carotid artery is within normal limits from the high cervical segments through the ICA termini. A prominent posterior  communicating artery is patent. The left internal carotid artery is occluded proximally. Flow is reconstituted in the distal left ICA at the level of the posterior communicating artery. A1 and M1 segments are normal. There is reduced size of left MCA branch vessels compared to the right without a significant focal stenosis or occlusion. There is signal loss in the V4 segments of the vertebral arteries bilaterally, more proximally on the left. There is no flow signal within the proximal basilar artery. There is flow in the distal basilar artery. A prominent right posterior communicating artery is present. Moderate narrowing is present in the left P2 segment. There is mild irregularity of distal PCA branch vessels without a focal stenosis or occlusion. IMPRESSION: 1. Occluded left internal carotid artery with reconstitution of the level of the left posterior communicating artery. Of note, there is narrowing within the left posterior communicating artery. 2. No significant stenosis or occlusion of the right internal carotid artery with a prominent right posterior communicating artery that is feeding the posterior circulation. 3. High-grade stenosis or occlusion of distal vertebral arteries bilaterally and of the proximal basilar artery. Flow is reconstituted in the distal basilar artery, presumably through the right posterior communicating artery. 4. Asymmetric attenuation of left MCA branch vessels without a focal stenosis or protrusion. This likely to represents the proximal occlusion decreased perfusion pressure due. 5. Extensive left hemisphere acute/subacute white matter infarcts involving the left centrum semi ovale and corona radiata as well as border zone areas. This corresponds with the proximal left ICA occlusion. 6. There are scattered punctate foci of cortical infarct involving the anterior left frontal lobe and left parietal lobe. There is also some involvement of the cingulate gyrus. Electronically Signed    By: Marin Roberts M.D.   On: 07/11/2019 16:03   Vas US Carotid  Result Date: 07/11/2019 Carotid Arterial Duplex Study Indications:       CVA, Speech disturbance and Weakness. Risk Factors:      Hypertension, prior MI, coronary artery disease. Other Factors:     CVA 08/2018. Comparison Study:  No prior study on file for comparison. Performing Technologist: Sherren Kerns RVS  Examination Guidelines: A complete evaluation includes B-mode imaging, spectral Doppler, color Doppler, and power Doppler as needed of all accessible portions of each vessel. Bilateral testing is considered an integral part of a complete examination. Limited examinations for reoccurring indications may be performed as noted.  Right Carotid Findings: +----------+--------+--------+--------+-----------+------------------+             PSV cm/s EDV cm/s Stenosis Describe    Comments            +----------+--------+--------+--------+-----------+------------------+  CCA Prox   109      23                            intimal thickening  +----------+--------+--------+--------+-----------+------------------+  CCA Distal 101      26  intimal thickening  +----------+--------+--------+--------+-----------+------------------+  ICA Prox   74       35                homogeneous                     +----------+--------+--------+--------+-----------+------------------+  ICA Distal 73       33                                                +----------+--------+--------+--------+-----------+------------------+  ECA        75       13                                                +----------+--------+--------+--------+-----------+------------------+ +----------+--------+-------+--------+-------------------+             PSV cm/s EDV cms Describe Arm Pressure (mmHG)  +----------+--------+-------+--------+-------------------+  Subclavian 63                                              +----------+--------+-------+--------+-------------------+ +---------+--------+--+--------+-+  Vertebral PSV cm/s 26 EDV cm/s 9  +---------+--------+--+--------+-+  Left Carotid Findings: +----------+--------+--------+--------+--------+-----------------------+             PSV cm/s EDV cm/s Stenosis Describe Comments                 +----------+--------+--------+--------+--------+-----------------------+  CCA Prox   82       14                         intimal thickening       +----------+--------+--------+--------+--------+-----------------------+  CCA Distal 62       17                         intimal thickening       +----------+--------+--------+--------+--------+-----------------------+  ICA Prox                     Occluded          soft plaque vs thrombus  +----------+--------+--------+--------+--------+-----------------------+  ECA        139      26                                                  +----------+--------+--------+--------+--------+-----------------------+ +----------+--------+--------+--------+-------------------+  Subclavian PSV cm/s EDV cm/s Describe Arm Pressure (mmHG)  +----------+--------+--------+--------+-------------------+             121                                             +----------+--------+--------+--------+-------------------+ +---------+--------+--+--------+-+  Vertebral PSV cm/s 55 EDV cm/s 4  +---------+--------+--+--------+-+  Summary: Right Carotid: The extracranial vessels were near-normal with only minimal wall  thickening or plaque. Left Carotid: The CCA appears occluded. Vertebrals:  Bilateral vertebral arteries demonstrate antegrade flow. Subclavians: Normal flow hemodynamics were seen in bilateral subclavian              arteries. *See table(s) above for measurements and observations.     Preliminary         Scheduled Meds:  atorvastatin  40 mg Oral q1800   clopidogrel  75 mg Oral Daily   Continuous Infusions:   LOS: 0 days      Zachary Hawkingalph Vinette Crites, MD [facility] 07/11/2019, 4:07 PM  If 7PM-7AM, please contact night-coverage [email or web address]

## 2019-07-11 NOTE — Progress Notes (Signed)
Patient's meds sent to pharmacy

## 2019-07-11 NOTE — Progress Notes (Signed)
VASCULAR LAB PRELIMINARY  PRELIMINARY  PRELIMINARY  PRELIMINARY  Carotid duplex completed.    Preliminary report:  See CV proc for preliminary report.   Called Dr. Erlinda Hong with results.   Zachary Mccann, RVT 07/11/2019, 12:57 PM

## 2019-07-11 NOTE — Evaluation (Signed)
Physical Therapy Evaluation Patient Details Name: Zachary Mccann MRN: [id number] DOB: Dec 29, 1970 Today's Date: 07/11/2019   History of Present Illness  Patient is a 48 y/o male presenting to the ED on 07/10/2019 with primary complaints of slurred speech, R facial droop. CT head-unremarkable. MRI pending. Past medical history significant for CVA, hypertension, CKD 3, coronary artery disease.     Clinical Impression  Patient admitted with the above. Patient reports he was ambulatory with use of RW prior to admission - family available to assist with ADLs when needed. Patient today received in supine - unaware of urine incontinence. Patient requiring Min A for bed level mobility with up to Mod A +2 for pivot transfer to recliner. Patient with reduced awareness to R UE/LE with cueing needed for safety and sequencing. Assist for balance with mobility. Due to current functional status, will recommend CIR level therapies at discharge. PT to follow acutely.      Follow Up Recommendations CIR    Equipment Recommendations  Other (comment)(TBD)    Recommendations for Other Services Rehab consult     Precautions / Restrictions Precautions Precautions: Fall Restrictions Weight Bearing Restrictions: No      Mobility  Bed Mobility Overal bed mobility: Needs Assistance Bed Mobility: Supine to Sit     Supine to sit: Min assist     General bed mobility comments: Min A to come to EOB  Transfers Overall transfer level: Needs assistance Equipment used: Rolling walker (2 wheeled) Transfers: Sit to/from [employer] Sit to Stand: Min assist;+2 physical assistance Stand pivot transfers: Mod assist;+2 physical assistance       General transfer comment: Min A +2 tos tand at bedside with cueing for safety and hand placement; Mod A +2 for pivot to recliner with reduced active mobility at R LE requiring increased verbal/tactile cueing  Ambulation/Gait             General  Gait Details: deferred  Stairs            Wheelchair Mobility    Modified Rankin (Stroke Patients Only) Modified Rankin (Stroke Patients Only) Pre-Morbid Rankin Score: No significant disability Modified Rankin: Moderately severe disability     Balance Overall balance assessment: Needs assistance Sitting-balance support: Single extremity supported;Feet supported Sitting balance-Leahy Scale: Fair     Standing balance support: Bilateral upper extremity supported;During functional activity Standing balance-Leahy Scale: Poor                               Pertinent Vitals/Pain Pain Assessment: No/denies pain    Home Living Family/patient expects to be discharged to:: Private residence Living Arrangements: Spouse/significant other Available Help at Discharge: Available 24 hours/day Type of Home: House Home Access: Stairs to enter Entrance Stairs-Rails: Right Entrance Stairs-Number of Steps: 2 Home Layout: Two level;Able to live on main level with bedroom/bathroom Home Equipment: Dan Humphreys - 2 wheels;Shower seat;Bedside commode      Prior Function Level of Independence: Needs assistance   Gait / Transfers Assistance Needed: reports he would walk with RW  ADL's / Homemaking Assistance Needed: family available to assist with ADLs and meals        Hand Dominance        Extremity/Trunk Assessment   Upper Extremity Assessment Upper Extremity Assessment: Defer to OT evaluation    Lower Extremity Assessment Lower Extremity Assessment: Generalized weakness;RLE deficits/detail RLE Deficits / Details: difficulty lifting LE against gravity - reduced weight shift to  this LE    Cervical / Trunk Assessment Cervical / Trunk Assessment: Normal  Communication   Communication: Expressive difficulties  Cognition Arousal/Alertness: Awake/alert Behavior During Therapy: WFL for tasks assessed/performed Overall Cognitive Status: No family/caregiver present to  determine baseline cognitive functioning                                 General Comments: make appropriate conversation - sometimes difficult to understand      General Comments General comments (skin integrity, edema, etc.): nursing assisting with mobility    Exercises     Assessment/Plan    PT Assessment Patient needs continued PT services  PT Problem List Decreased strength;Decreased activity tolerance;Decreased mobility;Decreased balance;Decreased coordination;Decreased knowledge of use of DME;Decreased knowledge of precautions;Decreased safety awareness       PT Treatment Interventions DME instruction;Gait training;Stair training;Functional mobility training;Therapeutic activities;Therapeutic exercise;Balance training;Neuromuscular re-education;Patient/family education    PT Goals (Current goals can be found in the Care Plan section)  Acute Rehab PT Goals Patient Stated Goal: "I need some rest" PT Goal Formulation: With patient Time For Goal Achievement: 07/25/19 Potential to Achieve Goals: Good    Frequency Min 4X/week   Barriers to discharge        Co-evaluation               AM-PAC PT "6 Clicks" Mobility  Outcome Measure Help needed turning from your back to your side while in a flat bed without using bedrails?: A Little Help needed moving from lying on your back to sitting on the side of a flat bed without using bedrails?: A Little Help needed moving to and from a bed to a chair (including a wheelchair)?: A Lot Help needed standing up from a chair using your arms (e.g., wheelchair or bedside chair)?: A Lot Help needed to walk in hospital room?: A Lot Help needed climbing 3-5 steps with a railing? : Total 6 Click Score: 13    End of Session Equipment Utilized During Treatment: Gait belt Activity Tolerance: Patient tolerated treatment well Patient left: in chair;with call bell/phone within reach;with chair alarm set;with nursing/sitter in  room Nurse Communication: Mobility status PT Visit Diagnosis: Unsteadiness on feet (R26.81);Other abnormalities of gait and mobility (R26.89);Muscle weakness (generalized) (M62.81)    Time: 5284-1324 PT Time Calculation (min) (ACUTE ONLY): 37 min   Charges:   PT Evaluation $PT Eval Moderate Complexity: 1 Mod PT Treatments $Therapeutic Activity: 8-22 mins         Lanney Gins, PT, DPT Supplemental Physical Therapist 07/11/19 10:29 AM Pager: [phone] Office: [phone]

## 2019-07-11 NOTE — Consult Note (Signed)
Neurology Consultation Reason for Consult: Suspected stroke Referring Physician: Mariea Clonts, E  CC: Slurred speech  History is obtained from: Patient  HPI: Zachary Mccann is a 48 y.o. male with a history of previous stroke who was found to be much more dysarthric with right facial droop more than typical and was therefore brought to the emergency department.  He states that it started morning of 7/23, when his niece visited him, she noticed it was significantly different than his baseline and therefore took him to the emergency department at [facility].  He has not been compliant with his medications after losing insurance coverage   LKW: 7/23 tpa given?: no, outside of window    ROS: A 14 point ROS was performed and is negative except as noted in the HPI.  Unable to obtain due to altered mental status.   Past Medical History:  Diagnosis Date  . CAD (coronary artery disease)    a. s/p NSTEMI in 04/2018 with DES to LCx.   [name] Hypertension   . Myocardial infarction (HCC)   . Pneumonia   . Stroke [facility])      Family History  Problem Relation Age of Onset  . Hypertension Mother   . Stroke Mother   . Dementia Mother   . Hypertension Father   . Stroke Father   . Cancer Father   . Alcoholism Father   . Cancer Sister      Social History:  reports that he has been smoking cigarettes. He has been smoking about 0.25 packs per day. He has never used smokeless tobacco. He reports previous alcohol use. He reports previous drug use.   Exam: Current vital signs: BP (!) 142/86 (BP Location: Left Arm)   Pulse 70   Temp 98.5 F (36.9 C) (Oral)   Resp 16   Ht 6' (1.829 m)   Wt (!) 147 kg   SpO2 100%   BMI 43.95 kg/m  Vital signs in last 24 hours: Temp:  [98.2 F (36.8 C)-99.4 F (37.4 C)] 98.5 F (36.9 C) (07/25 0356) Pulse Rate:  [65-80] 70 (07/25 0356) Resp:  [13-19] 16 (07/25 0200) BP: (120-143)/(76-99) 142/86 (07/25 0356) SpO2:  [20 %-100 %] 100 % (07/25 0356) Weight:   [147 kg] 147 kg (07/24 1654)   Physical Exam  Constitutional: Appears well-developed and well-nourished.  Psych: Affect appropriate to situation Eyes: No scleral injection HENT: No OP obstrucion Head: Normocephalic.  Cardiovascular: Normal rate and regular rhythm.  Respiratory: Effort normal, non-labored breathing GI: Soft.  No distension. There is no tenderness.  Skin: WDI  Neuro: Mental Status: Patient is awake, alert, oriented to person, place, month, year, and situation. Patient is able to give a clear and coherent history. No signs of aphasia or neglect Cranial Nerves: II: Visual Fields are full. Pupils are equal, round, and reactive to light.   III,IV, VI: When I initially awaken him, he has a slightly disconjugate gaze, but when he fixates he conjugate's and has full EOM V: Facial sensation is symmetric to temperature VII: Facial movement with significant right facial weakness VIII: hearing is intact to voice X: Uvula elevates symmetrically XI: Shoulder shrug is symmetric. XII: tongue is midline without atrophy or fasciculations.  Motor: Tone is normal. Bulk is normal. 5/5 strength was present in left arm, 4/5 on the right, 4-/5 right leg, 4/5 left leg Sensory: Sensation is symmetric to light touch and temperature in the arms and legs. Cerebellar: His exam is consistent with mild weakness  I have reviewed labs in epic and the results pertinent to this consultation are: Creatinine 2.3  I have reviewed the images obtained: CT head-unremarkable  Impression: 48 year old male with markedly worsened speech most concerning for an acute stroke, possibly brainstem.  He will need physical therapy, speech therapy and further evaluation.  Recommendations: - HgbA1c, fasting lipid panel - MRI, MRA  of the brain without contrast - Frequent neuro checks - Echocardiogram - Carotid dopplers - Prophylactic therapy-Antiplatelet med: Plavix 75 mg daily, would typically consider  dual antiplatelet therapy but he has documented allergy to aspirin. - Risk factor modification - Telemetry monitoring - PT consult, OT consult, Speech consult - Stroke team to follow   Roland Rack, MD [facility] [phone]  If 7pm- 7am, please page neurology on call as listed in [place].

## 2019-07-11 NOTE — Evaluation (Signed)
Occupational Therapy Evaluation Patient Details Name: Zachary Mccann MRN: [id number] DOB: 09/14/1971 Today's Date: 07/11/2019    History of Present Illness Patient is a 48 y/o male presenting to the ED on 07/10/2019 with primary complaints of slurred speech, R facial droop. CT head-unremarkable. MRI pending. Past medical history significant for CVA, hypertension, CKD 3, coronary artery disease.    Clinical Impression   Pt admitted with above. He demonstrates the below listed deficits and will benefit from continued OT to maximize safety and independence with BADLs.  OT eval limited by pt fatigue (he reports he has been awake since 2am).  He presents with significant dysarthria, delayed processing, follows commands inconsistently (likely due to fatigue), Rt hemiparesis, and impaired balance.  He currently requires min A - total A for ADLs.  He indicates he lives with his girlfriend, and ambulated with RW.  Feel he would benefit from CIR level therapies to allow him to maximize safety and independence with ADLs.  Will follow acutely.       Follow Up Recommendations  CIR    Equipment Recommendations  None recommended by OT    Recommendations for Other Services Rehab consult     Precautions / Restrictions Precautions Precautions: Fall      Mobility Bed Mobility Overal bed mobility: Needs Assistance Bed Mobility: Rolling Rolling: Min assist            Transfers                 General transfer comment: deferred due to fatigue     Balance                                           ADL either performed or assessed with clinical judgement   ADL Overall ADL's : Needs assistance/impaired Eating/Feeding: Set up;Bed level   Grooming: Wash/dry hands;Wash/dry face;Oral care;Set up;Bed level   Upper Body Bathing: Moderate assistance;Bed level   Lower Body Bathing: Maximal assistance;Bed level   Upper Body Dressing : Maximal assistance;Bed level    Lower Body Dressing: Total assistance;Bed level   Toilet Transfer: Total assistance Toilet Transfer Details (indicate cue type and reason): unable due to fatigue  Toileting- Clothing Manipulation and Hygiene: Total assistance;Bed level         General ADL Comments: limited by fatigue this session      Vision Patient Visual Report: No change from baseline Additional Comments: Pursuits grossly WFL.  He is able to read the clock on the wall.  He demonstrated difficulty with fully assessment due to fatigue      Perception     Praxis      Pertinent Vitals/Pain Pain Assessment: No/denies pain     Hand Dominance Left   Extremity/Trunk Assessment Upper Extremity Assessment Upper Extremity Assessment: RUE deficits/detail RUE Deficits / Details: PROM/AAROM WFL.  Pt is able to reach to ~90* shoulder flexion.  Full active flex/extension noted.  Movement is slow and effortful.   RUE Coordination: decreased gross motor;decreased fine motor   Lower Extremity Assessment Lower Extremity Assessment: Defer to PT evaluation       Communication Communication Communication: Expressive difficulties(dysarthria )   Cognition Arousal/Alertness: Lethargic Behavior During Therapy: Flat affect Overall Cognitive Status: Impaired/Different from baseline Area of Impairment: Attention;Following commands                   Current Attention Level:  Focused;Sustained   Following Commands: Follows one step commands inconsistently;Follows one step commands with increased time       General Comments: Pt very lethargic.  His responses are delayed and followed one step commands intermittently.  He did perk up for a few minutes and was able to follow commands more briskly    General Comments       Exercises     Shoulder Instructions      Home Living Family/patient expects to be discharged to:: Private residence Living Arrangements: Spouse/significant other Available Help at Discharge:  Available 24 hours/day Type of Home: House Home Access: Stairs to enter [employer] of Steps: 2 Entrance Stairs-Rails: Right Home Layout: Two level;Able to live on main level with bedroom/bathroom     Bathroom Shower/Tub: Producer, television/film/video: Standard     Home Equipment: Environmental consultant - 2 wheels;Shower seat;Bedside commode          Prior Functioning/Environment Level of Independence: Needs assistance  Gait / Transfers Assistance Needed: reports he would walk with RW ADL's / Homemaking Assistance Needed: family available to assist with ADLs and meals            OT Problem List: Decreased strength;Decreased range of motion;Decreased activity tolerance;Impaired balance (sitting and/or standing);Impaired vision/perception;Decreased coordination;Decreased cognition;Decreased safety awareness;Decreased knowledge of use of DME or AE;Impaired UE functional use      OT Treatment/Interventions: Self-care/ADL training;Neuromuscular education;DME and/or AE instruction;Therapeutic activities;Cognitive remediation/compensation;Visual/perceptual remediation/compensation;Balance training;Patient/family education    OT Goals(Current goals can be found in the care plan section) Acute Rehab OT Goals Patient Stated Goal: did not state  OT Goal Formulation: With patient Time For Goal Achievement: 07/25/19 Potential to Achieve Goals: Good ADL Goals Pt Will Perform Grooming: with set-up;sitting Pt Will Perform Upper Body Bathing: with set-up;with supervision;sitting Pt Will Perform Lower Body Bathing: with mod assist;sit to/from stand Pt Will Perform Upper Body Dressing: with min assist;sitting Pt Will Perform Lower Body Dressing: with mod assist;sit to/from stand Pt Will Transfer to Toilet: with mod assist;stand pivot transfer;bedside commode Pt Will Perform Toileting - Clothing Manipulation and hygiene: with mod assist;sit to/from stand Additional ADL Goal #1: Pt will reach  for objects at 120* shoulder flexion Rt uE  OT Frequency: Min 2X/week   Barriers to D/C:            Co-evaluation              AM-PAC OT "6 Clicks" Daily Activity     Outcome Measure Help from another person eating meals?: A Little Help from another person taking care of personal grooming?: A Little Help from another person toileting, which includes using toliet, bedpan, or urinal?: A Lot Help from another person bathing (including washing, rinsing, drying)?: A Lot Help from another person to put on and taking off regular upper body clothing?: A Lot Help from another person to put on and taking off regular lower body clothing?: Total 6 Click Score: 13   End of Session    Activity Tolerance: Patient limited by fatigue Patient left: in bed;with call bell/phone within reach  OT Visit Diagnosis: Unsteadiness on feet (R26.81);Hemiplegia and hemiparesis Hemiplegia - Right/Left: Right Hemiplegia - dominant/non-dominant: Non-Dominant Hemiplegia - caused by: Cerebral infarction                Time: 6438-3818 OT Time Calculation (min): 18 min Charges:  OT General Charges $OT Visit: 1 Visit OT Evaluation $OT Eval Moderate Complexity: 1 Mod  Adiana Smelcer, OTR/L Acute Rehabilitation Services  Pager [phone] Office [phone]   Lucille Passy M 07/11/2019, 6:08 PM

## 2019-07-12 DIAGNOSIS — Z8673 Personal history of transient ischemic attack (TIA), and cerebral infarction without residual deficits: Secondary | ICD-10-CM

## 2019-07-12 DIAGNOSIS — I679 Cerebrovascular disease, unspecified: Secondary | ICD-10-CM

## 2019-07-12 LAB — BASIC METABOLIC PANEL
Anion gap: 9 (ref 5–15)
BUN: 19 mg/dL (ref 6–20)
CO2: 21 mmol/L — ABNORMAL LOW (ref 22–32)
Calcium: 8.9 mg/dL (ref 8.9–10.3)
Chloride: 107 mmol/L (ref 98–111)
Creatinine, Ser: 1.62 mg/dL — ABNORMAL HIGH (ref 0.61–1.24)
GFR calc Af Amer: 57 mL/min — ABNORMAL LOW (ref >60)
GFR calc non Af Amer: 49 mL/min — ABNORMAL LOW (ref >60)
Glucose, Bld: 98 mg/dL (ref 70–99)
Potassium: 4 mmol/L (ref 3.5–5.1)
Sodium: 137 mmol/L (ref 135–145)

## 2019-07-12 MED ORDER — DEXTROSE-NACL 5-0.45 % IV SOLN: INTRAVENOUS | Status: DC

## 2019-07-12 MED ORDER — ORAL CARE MOUTH RINSE
15.0000 mL | Freq: Two times a day (BID) | OROMUCOSAL | Status: DC
  Administered 2019-07-12 – 2019-07-16 (×7): 15 mL via OROMUCOSAL

## 2019-07-12 MED ORDER — DEXTROSE-NACL 5-0.9 % IV SOLN
INTRAVENOUS | Status: DC
  Administered 2019-07-12 – 2019-07-16 (×5): via INTRAVENOUS

## 2019-07-12 MED ORDER — CHLORHEXIDINE GLUCONATE 0.12 % MT SOLN
15.0000 mL | Freq: Two times a day (BID) | OROMUCOSAL | Status: DC
  Administered 2019-07-12 – 2019-07-17 (×10): 15 mL via OROMUCOSAL
  Filled 2019-07-12 (×8): qty 15

## 2019-07-12 NOTE — Progress Notes (Signed)
STROKE TEAM PROGRESS NOTE   SUBJECTIVE (INTERVAL HISTORY) He is sitting in bed, more awake alert, still has severe expressive aphasia and right hemiparesis. PT/OT recommend CIR. Did not pass swallow, still NPO  OBJECTIVE Vitals:   07/11/19 1743 07/11/19 2004 07/12/19 0009 07/12/19 0410  BP: (!) 149/74 (!) 145/85 (!) 160/82 (!) 148/89  Pulse: 76 67 70 71  Resp: (!) 22  16   Temp: 98.8 F (37.1 C) 98.9 F (37.2 C) 98 F (36.7 C) 97.7 F (36.5 C)  TempSrc: Oral Oral Oral Oral  SpO2: 100% 100% 100% 100%  Weight:      Height:        CBC:  Recent Labs  Lab 07/10/19 1704  WBC 8.8  NEUTROABS 5.9  HGB 14.3  HCT 42.5  MCV 94.4  PLT 240    Basic Metabolic Panel:  Recent Labs  Lab 07/11/19 1649 07/12/19 0443  NA 138 137  K 3.8 4.0  CL 106 107  CO2 22 21*  GLUCOSE 116* 98  BUN 23* 19  CREATININE 1.80* 1.62*  CALCIUM 9.0 8.9    Lipid Panel:     Component Value Date/Time   CHOL 141 07/11/2019 0732   TRIG 117 07/11/2019 0732   HDL 28 (L) 07/11/2019 0732   CHOLHDL 5.0 07/11/2019 0732   VLDL 23 07/11/2019 0732   LDLCALC 90 07/11/2019 0732   HgbA1c:  Lab Results  Component Value Date   HGBA1C 5.6 07/11/2019   Urine Drug Screen:     Component Value Date/Time   LABOPIA NONE DETECTED 07/10/2019 2250   COCAINSCRNUR NONE DETECTED 07/10/2019 2250   LABBENZ NONE DETECTED 07/10/2019 2250   AMPHETMU NONE DETECTED 07/10/2019 2250   THCU POSITIVE (A) 07/10/2019 2250   LABBARB NONE DETECTED 07/10/2019 2250    Alcohol Level     Component Value Date/Time   ETH <10 07/10/2019 1704    IMAGING  Ct Head Wo Contrast 07/10/2019 IMPRESSION:  Area of prior infarction in the superior aspect of the left cerebellum. Areas of prior ischemia are noted. The changes on the left are new from the prior exam but consistent with more subacute to chronic ischemia. No evidence of acute hemorrhage noted.  [name]  Result Date: 07/11/2019 CLINICAL DATA:  Altered  mental status. Left facial droop. Right-sided weakness. Symptoms for 2 days. EXAM: MRI HEAD WITHOUT CONTRAST MRA HEAD WITHOUT CONTRAST TECHNIQUE: Multiplanar, multiecho pulse sequences of the brain and surrounding structures were obtained without intravenous contrast. Angiographic images of the head were obtained using MRA technique without contrast. COMPARISON:  CT head without contrast 07/10/2019. CTA of the head and neck I/23/19. MRI brain and MRA head 09/07/2018. FINDINGS: MRI HEAD FINDINGS Brain: The diffusion-weighted images demonstrate scattered areas of acute nonhemorrhagic infarct within the right frontal and parietal white matter. There is involvement of the left centrum semi ovale and corona radiata extending anteriorly into border zone regions. Additional punctate cortical areas are present in the left parietal lobe and along the cingulate gyrus. There is no significant involvement of the right hemisphere. There is T2 signal changes associated with the areas of acute/subacute infarction. Remote lacunar infarct of the right centrum semi ovale is noted. Periventricular white matter changes are present on the right as well. The brainstem is normal. Remote left cerebellar infarct is again seen. Cerebellum is otherwise within normal limits. The internal auditory canals are within normal limits. Vascular: Abnormal signal is present within the vertebral arteries at the foramen magnum  bilaterally. There is flow in the distal basilar artery. There is no flow in the left internal carotid artery. Skull and upper cervical spine: Mild degenerative changes are present the upper cervical spine. Skull base is normal. Midline structures are otherwise within normal limits. Sinuses/Orbits: The paranasal sinuses and mastoid air cells are clear. The globes and orbits are within normal limits. MRA HEAD FINDINGS The right internal carotid artery is within normal limits from the high cervical segments through the ICA termini. A  prominent posterior communicating artery is patent. The left internal carotid artery is occluded proximally. Flow is reconstituted in the distal left ICA at the level of the posterior communicating artery. A1 and M1 segments are normal. There is reduced size of left MCA branch vessels compared to the right without a significant focal stenosis or occlusion. There is signal loss in the V4 segments of the vertebral arteries bilaterally, more proximally on the left. There is no flow signal within the proximal basilar artery. There is flow in the distal basilar artery. A prominent right posterior communicating artery is present. Moderate narrowing is present in the left P2 segment. There is mild irregularity of distal PCA branch vessels without a focal stenosis or occlusion. IMPRESSION: 1. Occluded left internal carotid artery with reconstitution of the level of the left posterior communicating artery. Of note, there is narrowing within the left posterior communicating artery. 2. No significant stenosis or occlusion of the right internal carotid artery with a prominent right posterior communicating artery that is feeding the posterior circulation. 3. High-grade stenosis or occlusion of distal vertebral arteries bilaterally and of the proximal basilar artery. Flow is reconstituted in the distal basilar artery, presumably through the right posterior communicating artery. 4. Asymmetric attenuation of left MCA branch vessels without a focal stenosis or protrusion. This likely to represents the proximal occlusion decreased perfusion pressure due. 5. Extensive left hemisphere acute/subacute white matter infarcts involving the left centrum semi ovale and corona radiata as well as border zone areas. This corresponds with the proximal left ICA occlusion. 6. There are scattered punctate foci of cortical infarct involving the anterior left frontal lobe and left parietal lobe. There is also some involvement of the cingulate gyrus.  Electronically Signed   By: Marin Robertshristopher  Mattern M.D.   On: 07/11/2019 16:03   [name]  Result Date: 07/11/2019 CLINICAL DATA:  Altered mental status. Left facial droop. Right-sided weakness. Symptoms for 2 days. EXAM: MRI HEAD WITHOUT CONTRAST MRA HEAD WITHOUT CONTRAST TECHNIQUE: Multiplanar, multiecho pulse sequences of the brain and surrounding structures were obtained without intravenous contrast. Angiographic images of the head were obtained using MRA technique without contrast. COMPARISON:  CT head without contrast 07/10/2019. CTA of the head and neck I/23/19. MRI brain and MRA head 09/07/2018. FINDINGS: MRI HEAD FINDINGS Brain: The diffusion-weighted images demonstrate scattered areas of acute nonhemorrhagic infarct within the right frontal and parietal white matter. There is involvement of the left centrum semi ovale and corona radiata extending anteriorly into border zone regions. Additional punctate cortical areas are present in the left parietal lobe and along the cingulate gyrus. There is no significant involvement of the right hemisphere. There is T2 signal changes associated with the areas of acute/subacute infarction. Remote lacunar infarct of the right centrum semi ovale is noted. Periventricular white matter changes are present on the right as well. The brainstem is normal. Remote left cerebellar infarct is again seen. Cerebellum is otherwise within normal limits. The internal auditory canals are within normal limits.  Vascular: Abnormal signal is present within the vertebral arteries at the foramen magnum bilaterally. There is flow in the distal basilar artery. There is no flow in the left internal carotid artery. Skull and upper cervical spine: Mild degenerative changes are present the upper cervical spine. Skull base is normal. Midline structures are otherwise within normal limits. Sinuses/Orbits: The paranasal sinuses and mastoid air cells are clear. The globes and orbits are  within normal limits. MRA HEAD FINDINGS The right internal carotid artery is within normal limits from the high cervical segments through the ICA termini. A prominent posterior communicating artery is patent. The left internal carotid artery is occluded proximally. Flow is reconstituted in the distal left ICA at the level of the posterior communicating artery. A1 and M1 segments are normal. There is reduced size of left MCA branch vessels compared to the right without a significant focal stenosis or occlusion. There is signal loss in the V4 segments of the vertebral arteries bilaterally, more proximally on the left. There is no flow signal within the proximal basilar artery. There is flow in the distal basilar artery. A prominent right posterior communicating artery is present. Moderate narrowing is present in the left P2 segment. There is mild irregularity of distal PCA branch vessels without a focal stenosis or occlusion. IMPRESSION: 1. Occluded left internal carotid artery with reconstitution of the level of the left posterior communicating artery. Of note, there is narrowing within the left posterior communicating artery. 2. No significant stenosis or occlusion of the right internal carotid artery with a prominent right posterior communicating artery that is feeding the posterior circulation. 3. High-grade stenosis or occlusion of distal vertebral arteries bilaterally and of the proximal basilar artery. Flow is reconstituted in the distal basilar artery, presumably through the right posterior communicating artery. 4. Asymmetric attenuation of left MCA branch vessels without a focal stenosis or protrusion. This likely to represents the proximal occlusion decreased perfusion pressure due. 5. Extensive left hemisphere acute/subacute white matter infarcts involving the left centrum semi ovale and corona radiata as well as border zone areas. This corresponds with the proximal left ICA occlusion. 6. There are  scattered punctate foci of cortical infarct involving the anterior left frontal lobe and left parietal lobe. There is also some involvement of the cingulate gyrus. Electronically Signed   By: Marin Roberts M.D.   On: 07/11/2019 16:03   Vas US Carotid  Result Date: 07/11/2019 Carotid Arterial Duplex Study Indications:       CVA, Speech disturbance and Weakness. Risk Factors:      Hypertension, prior MI, coronary artery disease. Other Factors:     CVA 08/2018. Comparison Study:  No prior study on file for comparison. Performing Technologist: Sherren Kerns RVS  Examination Guidelines: A complete evaluation includes B-mode imaging, spectral Doppler, color Doppler, and power Doppler as needed of all accessible portions of each vessel. Bilateral testing is considered an integral part of a complete examination. Limited examinations for reoccurring indications may be performed as noted.  Right Carotid Findings: +----------+--------+--------+--------+-----------+------------------+           PSV cm/sEDV cm/sStenosisDescribe   Comments           +----------+--------+--------+--------+-----------+------------------+ CCA Prox  109     23                         intimal thickening +----------+--------+--------+--------+-----------+------------------+ CCA Distal101     26  intimal thickening +----------+--------+--------+--------+-----------+------------------+ ICA Prox  74      35              homogeneous                   +----------+--------+--------+--------+-----------+------------------+ ICA Distal73      33                                            +----------+--------+--------+--------+-----------+------------------+ ECA       75      13                                            +----------+--------+--------+--------+-----------+------------------+ +----------+--------+-------+--------+-------------------+           PSV cm/sEDV  cmsDescribeArm Pressure (mmHG) +----------+--------+-------+--------+-------------------+ [id number]                                         +----------+--------+-------+--------+-------------------+ +---------+--------+--+--------+-+ VertebralPSV cm/s26EDV cm/s9 +---------+--------+--+--------+-+  Left Carotid Findings: +----------+--------+--------+--------+--------+-----------------------+           PSV cm/sEDV cm/sStenosisDescribeComments                +----------+--------+--------+--------+--------+-----------------------+ CCA Prox  82      14                      intimal thickening      +----------+--------+--------+--------+--------+-----------------------+ CCA Distal62      17                      intimal thickening      +----------+--------+--------+--------+--------+-----------------------+ ICA Prox                  Occluded        soft plaque vs thrombus +----------+--------+--------+--------+--------+-----------------------+ ECA       139     26                                              +----------+--------+--------+--------+--------+-----------------------+ +----------+--------+--------+--------+-------------------+ SubclavianPSV cm/sEDV cm/sDescribeArm Pressure (mmHG) +----------+--------+--------+--------+-------------------+           121                                         +----------+--------+--------+--------+-------------------+ +---------+--------+--+--------+-+ VertebralPSV cm/s55EDV cm/s4 +---------+--------+--+--------+-+  Summary: Right Carotid: The extracranial vessels were near-normal with only minimal wall                thickening or plaque. Left Carotid: The CCA appears occluded. Vertebrals:  Bilateral vertebral arteries demonstrate antegrade flow. Subclavians: Normal flow hemodynamics were seen in bilateral subclavian              arteries. *See table(s) above for measurements and observations.      Preliminary     Transthoracic Echocardiogram   1. The left ventricle has a visually estimated ejection fraction of 40%. The cavity size was normal. There is mildly increased left ventricular wall thickness.  Left ventricular diastolic Doppler parameters are consistent with impaired relaxation. No  obvious, formed LV thrombus seen with Definity contrast.  2. There is akinesis of the left ventricular, basal-mid inferolateral wall and inferior wall.  3. The right ventricle has normal systolic function. The cavity was normal. There is no increase in right ventricular wall thickness.  4. The mitral valve is grossly normal.  5. The tricuspid valve is grossly normal.  6. The aortic valve was not well visualized.  7. The aorta is normal in size and structure.  8. The interatrial septum was not well visualized.   ECG - SR rate 89 BPM. (See cardiology reading for complete details)    PHYSICAL EXAM  Temp:  [97.7 F (36.5 C)-98.9 F (37.2 C)] 97.7 F (36.5 C) (07/26 0410) Pulse Rate:  [59-76] 71 (07/26 0410) Resp:  [16-22] 16 (07/26 0009) BP: (142-160)/(74-90) 148/89 (07/26 0410) SpO2:  [100 %] 100 % (07/26 0410)  General - morbid obesity, well developed, in no apparent distress.  Ophthalmologic - fundi not visualized due to noncooperation.  Cardiovascular - Regular rate and rhythm.  Neuro - awake alert, eyes open, not able to answer orientation questions but following simple commands. Severe expressive aphasia, barely any words out, severe dysarthria with intangible words. Not able to name or repeat. No gaze preference, tracking bilaterally, visual field full, PERRL, right facial droop, tongue midline. Right UE 3-/5 and RLE 3/5, LUE and LLE at least 4/5. Sensation subjectively symmetrical. FTN bilaterally intact but right slow in action. Gait not tested.   ASSESSMENT/PLAN [name] is a 48 y.o. male with history of ongoing tobacco use, CAD w stent / Mi, hypertension, previous  stroke and non compliant with medications after losing his insurance coverage, who was found to be much more dysarthric with right facial droop more than typical . He did not receive IV t-PA due to late presentation (>4.5 hours from time of onset)  Stroke:  Left MCA and ACA scattered infarct due to left ICA occlusion, large vessel disease   Resultant  Expressive aphasia with right hemiparesis  CT head - Area of prior infarction in the superior aspect of the left cerebellum  MRI head - Extensive left hemisphere acute/subacute white matter infarcts involving the left centrum semi ovale and corona radiata as well as border zone areas.  MRA head - left ICA occlusion, b/l VA distal occlusion, BA proximal occlusion with distal retrograde recon from PCOMs  Carotid Doppler - left ICA/CCA occlusion  2D Echo - EF 40%  Sars Corona Virus 2 - negative  LDL - 90  HgbA1c - 5.6  UDS - THCU  VTE prophylaxis - SCDs  Diet - Heart healthy with thin liquids.  No antithrombotic prior to admission, now on clopidogrel 75 mg daily. Pt allergic to ASA. Continue plavix on discharge.  Patient counseled to be compliant with his antithrombotic medications  Ongoing aggressive stroke risk factor management  Therapy recommendations:  CIR  Disposition:  Pending  Extra and intracranial stenosis/occlusion  08/2018 CTA head and neck unremarkable  However, this admission, MRA and carotid Doppler showed left ICA/CCA occlusion, bilateral VA distal occlusion, BA proximal occlusion  Consistent with large vessel disease  On Plavix  History of stroke  08/2018 CT showed left superior cerebellar infarct and old right BG infarct.  MRI confirmed left SCA infarct and old right CR/BG left pontine and left caudate head infarcts.  UDS showed THC positive.  CT head and neck unremarkable.  LDL 81  and A1c 5.4.  Creatinine 1.9.  TTE showed EF 35 to 40%.  TEE EF 45 to 50% with lateral hypokinesis, no PFO.  Hypercoagulable  work-up unremarkable.  Put on Plavix and high-dose Lipitor.  Outpatient 30-day CardioNet monitoring showed no A. Fib.  CAD with cardiomyopathy  non-STEMI status post stent in 04/2018. Not able to tolerate aspirin due to allergy, not able to tolerating Brilinta due to erectile dysfunction, not taking effient due to financial difficulty.  On coreg PTA  EF 40%  Likely noncompliant with medication  Tobacco abuse  Current heavy smoker  Continues to smoke after STEMI and stroke  Smoking cessation counseling provided  Pt is willing to quit  AKI on CKD  Creatinine 2.38->1.80->1.62  Continue IV fluid  BMP monitoring  Hypertension  Stable . Gradually normalize BP in 5-7 days . Long-term BP goal 130-150 due to left ICA occlusion  Hyperlipidemia  Lipid lowering medication PTA:  none  LDL 90, goal < 70  Current lipid lowering medication: Lipitor 40 mg daily  Continue statin at discharge  Dysphagia   Did not pass swallow  NPO for now  Speech to follow  Likely needs cortrak in am  Resume po meds after po access  Other Stroke Risk Factors  Previous ETOH use - Previous drug use  Obesity, Body mass index is 43.95 kg/m., recommend weight loss, diet and exercise as appropriate   Family hx stroke (mother and father)  Medication noncompliance  Other Active Problems  Aspirin allergy "swelling"  Hospital day # 1  Neurology will sign off. Please call with questions. [facility]. Thanks for the consult.  Marvel PlanJindong Bekah Igoe, MD PhD Stroke Neurology 07/12/2019 5:05 PM   To contact Stroke Continuity provider, please refer to [email or web address]. After hours, contact General Neurology

## 2019-07-12 NOTE — Progress Notes (Signed)
Rehab Admissions Coordinator Note:  Patient was screened by Cleatrice Burke for appropriateness for an Inpatient Acute Rehab Consult per PT and OT recs.  At this time, we are recommending Inpatient Rehab consult.  Cleatrice Burke RN MSN 07/12/2019, 9:46 AM  I can be reached at [phone].

## 2019-07-12 NOTE — Progress Notes (Signed)
Pt alert but nods to questions when asked, unable to verbally communicate, received from report that he developed verbal difficulty in the afternoon and not able to swallow, still has some coughing, hence been kept NPO. Monitoring continues

## 2019-07-12 NOTE — Progress Notes (Signed)
PROGRESS NOTE    Zachary Mccann  ATF:[id number] DOB: [birth year] DOA: 07/10/2019 PCP: Ailene Ards, NP   Brief Narrative: Zachary Mccann is a 48 y.o. male with medical history significant for CVA, hypertension, CKD 3, coronary artery disease. Patient presented secondary to slurred speech, facial droop and lethargy and presents with symptoms likely related to acute stroke.   Assessment & Plan:   Principal Problem:   Slurred speech Active Problems:   Essential hypertension   CKD (chronic kidney disease), stage III (HCC) = Secondary to Hypertension   History of non-ST elevation myocardial infarction (NSTEMI)   Cardiomyopathy ([place])   Cerebral embolism with cerebral infarction   Acute CVA (cerebrovascular accident) ([place])   Slurred speech Right hemiplegia Likely stroke based on clinical exam. MRI pending. Neurology on board. LDL of 90, hemoglobin A1C of 5.6%. No thrombus seen on Transthoracic Echocardiogram. -MRI/MRA pending -PT/OT recommendations: CIR -SLP recommendations: NPO for now, plan for MBS or FEES tomorrow -Continue Lipitor, Plavix when able (unable to give aspirin secondary to allergy) -Neurology recommendations pending today  Metabolic encephalopathy Thought secondary to kidney injury vs intracerebral injury. Appears to be oriented but difficult to assess  Acute kidney on CKD stage III Unknown etiology. Possibly prerenal. Given IV fluids. Baseline creatinine of 1.8-1.9. Creatinine of 2.38 on admission.  CAD Chronic systolic heart failure History of NSTEMI with DES placement. On Plavix -Continue Lipitor and Plavix   DVT prophylaxis: SCDs Code Status:   Code Status: Full Code Family Communication: None at bedside Disposition Plan: Discharge likely to CIR pending neuro workup   Consultants:   Neurology  Inpatient rehabilitation  Procedures:   Transthoracic Echocardiogram (07/11/2019) IMPRESSIONS    1. The left ventricle has a visually estimated  ejection fraction of 40%. The cavity size was normal. There is mildly increased left ventricular wall thickness. Left ventricular diastolic Doppler parameters are consistent with impaired relaxation. No  obvious, formed LV thrombus seen with Definity contrast.  2. There is akinesis of the left ventricular, basal-mid inferolateral wall and inferior wall.  3. The right ventricle has normal systolic function. The cavity was normal. There is no increase in right ventricular wall thickness.  4. The mitral valve is grossly normal.  5. The tricuspid valve is grossly normal.  6. The aortic valve was not well visualized.  7. The aorta is normal in size and structure.  8. The interatrial septum was not well visualized.  Antimicrobials:  None    Subjective: Unable to communicate  Objective: Vitals:   07/11/19 2004 07/12/19 0009 07/12/19 0410 07/12/19 0921  BP: (!) 145/85 (!) 160/82 (!) 148/89 137/89  Pulse: 67 70 71 62  Resp:  16  16  Temp: 98.9 F (37.2 C) 98 F (36.7 C) 97.7 F (36.5 C) 98.1 F (36.7 C)  TempSrc: Oral Oral Oral Oral  SpO2: 100% 100% 100% 99%  Weight:      Height:        Intake/Output Summary (Last 24 hours) at 07/12/2019 1442 Last data filed at 07/12/2019 0900 Gross per 24 hour  Intake 0 ml  Output 300 ml  Net -300 ml   Filed Weights   07/10/19 1654  Weight: (!) 147 kg    Examination:  General exam: Appears calm and comfortable Respiratory system: Clear to auscultation. Respiratory effort normal. Cardiovascular system: S1 & S2 heard, RRR. No murmurs, rubs, gallops or clicks. Gastrointestinal system: Abdomen is nondistended, soft and nontender. No organomegaly or masses felt. Normal bowel sounds heard. Central  nervous system: Alert. Apraxia, 3/5 RU/LE strength Extremities: No edema. No calf tenderness Skin: No cyanosis. No rashes Psychiatry: Mood & affect appropriate.     Data Reviewed: I have personally reviewed following labs and imaging  studies  CBC: Recent Labs  Lab 07/10/19 1704  WBC 8.8  NEUTROABS 5.9  HGB 14.3  HCT 42.5  MCV 94.4  PLT 240   Basic Metabolic Panel: Recent Labs  Lab 07/10/19 1704 07/11/19 1649 07/12/19 0443  NA 136 138 137  K 3.8 3.8 4.0  CL 102 106 107  CO2 22 22 21*  GLUCOSE 116* 116* 98  BUN 32* 23* 19  CREATININE 2.38* 1.80* 1.62*  CALCIUM 9.1 9.0 8.9   GFR: Estimated Creatinine Clearance: 83.1 mL/min (A) (by C-G formula based on SCr of 1.62 mg/dL (H)). Liver Function Tests: Recent Labs  Lab 07/10/19 1704  AST 13*  ALT 10  ALKPHOS 71  BILITOT 0.8  PROT 9.2*  ALBUMIN 3.9   No results for input(s): LIPASE, AMYLASE in the last 168 hours. No results for input(s): AMMONIA in the last 168 hours. Coagulation Profile: Recent Labs  Lab 07/10/19 1704  INR 1.1   Cardiac Enzymes: No results for input(s): CKTOTAL, CKMB, CKMBINDEX, TROPONINI in the last 168 hours. BNP (last 3 results) No results for input(s): PROBNP in the last 8760 hours. HbA1C: Recent Labs    07/11/19 0732  HGBA1C 5.6   CBG: No results for input(s): GLUCAP in the last 168 hours. Lipid Profile: Recent Labs    07/11/19 0732  CHOL 141  HDL 28*  LDLCALC 90  TRIG 161117  CHOLHDL 5.0   Thyroid Function Tests: No results for input(s): TSH, T4TOTAL, FREET4, T3FREE, THYROIDAB in the last 72 hours. Anemia Panel: No results for input(s): VITAMINB12, FOLATE, FERRITIN, TIBC, IRON, RETICCTPCT in the last 72 hours. Sepsis Labs: No results for input(s): PROCALCITON, LATICACIDVEN in the last 168 hours.  Recent Results (from the past 240 hour(s))  SARS Coronavirus 2 (CEPHEID - Performed in [facility] hospital lab), Hosp Order     Status: None   Collection Time: 07/10/19  5:25 PM   Specimen: Nasopharyngeal Swab  Result Value Ref Range Status   SARS Coronavirus 2 NEGATIVE NEGATIVE Final    Comment: (NOTE) If result is NEGATIVE SARS-CoV-2 target nucleic acids are NOT DETECTED. The SARS-CoV-2 RNA is generally  detectable in upper and lower  respiratory specimens during the acute phase of infection. The lowest  concentration of SARS-CoV-2 viral copies this assay can detect is 250  copies / mL. A negative result does not preclude SARS-CoV-2 infection  and should not be used as the sole basis for treatment or other  patient management decisions.  A negative result may occur with  improper specimen collection / handling, submission of specimen other  than nasopharyngeal swab, presence of viral mutation(s) within the  areas targeted by this assay, and inadequate number of viral copies  (<250 copies / mL). A negative result must be combined with clinical  observations, patient history, and epidemiological information. If result is POSITIVE SARS-CoV-2 target nucleic acids are DETECTED. The SARS-CoV-2 RNA is generally detectable in upper and lower  respiratory specimens dur ing the acute phase of infection.  Positive  results are indicative of active infection with SARS-CoV-2.  Clinical  correlation with patient history and other diagnostic information is  necessary to determine patient infection status.  Positive results do  not rule out bacterial infection or co-infection with other viruses. If result is  PRESUMPTIVE POSTIVE SARS-CoV-2 nucleic acids MAY BE PRESENT.   A presumptive positive result was obtained on the submitted specimen  and confirmed on repeat testing.  While 2019 novel coronavirus  (SARS-CoV-2) nucleic acids may be present in the submitted sample  additional confirmatory testing may be necessary for epidemiological  and / or clinical management purposes  to differentiate between  SARS-CoV-2 and other Sarbecovirus currently known to infect humans.  If clinically indicated additional testing with an alternate test  methodology [phone]) is advised. The SARS-CoV-2 RNA is generally  detectable in upper and lower respiratory sp ecimens during the acute  phase of infection. The  expected result is Negative. Fact Sheet for Patients:  BoilerBrush.com.cy Fact Sheet for Healthcare Providers: https://pope.com/ This test is not yet approved or cleared by the [place] FDA and has been authorized for detection and/or diagnosis of SARS-CoV-2 by FDA under an Emergency Use Authorization (EUA).  This EUA will remain in effect (meaning this test can be used) for the duration of the COVID-19 declaration under Section 564(b)(1) of the Act, 21 U.S.C. section 360bbb-3(b)(1), unless the authorization is terminated or revoked sooner. Performed at [facility], [address] [place], [place] [address]          Radiology Studies: Ct Head Wo Contrast  Result Date: 07/10/2019 CLINICAL DATA:  Slurred speech and loss of balance EXAM: CT HEAD WITHOUT CONTRAST TECHNIQUE: Contiguous axial images were obtained from the base of the skull through the vertex without intravenous contrast. COMPARISON:  09/08/2018 FINDINGS: Brain: Wedge-shaped area decreased attenuation is noted in the superior aspect of the left cerebellum consistent with prior infarct. This corresponds to an area of prior ischemia on the previous exam. There are scattered areas of decreased attenuation in the periventricular white matter consistent with prior lacunar infarcts. These have increased in the interval from the prior exam. No findings to suggest acute infarct or acute hemorrhage are seen. Vascular: No hyperdense vessel or unexpected calcification. Skull: Normal. Negative for fracture or focal lesion. Sinuses/Orbits: No acute finding. Other: None. IMPRESSION: Area of prior infarction in the superior aspect of the left cerebellum. Areas of prior ischemia are noted. The changes on the left are new from the prior exam but consistent with more subacute to chronic ischemia. No evidence of acute hemorrhage noted. Electronically Signed   By: Alcide Clever M.D.   On: 07/10/2019  17:26   [name]  Result Date: 07/11/2019 CLINICAL DATA:  Altered mental status. Left facial droop. Right-sided weakness. Symptoms for 2 days. EXAM: MRI HEAD WITHOUT CONTRAST MRA HEAD WITHOUT CONTRAST TECHNIQUE: Multiplanar, multiecho pulse sequences of the brain and surrounding structures were obtained without intravenous contrast. Angiographic images of the head were obtained using MRA technique without contrast. COMPARISON:  CT head without contrast 07/10/2019. CTA of the head and neck I/23/19. MRI brain and MRA head 09/07/2018. FINDINGS: MRI HEAD FINDINGS Brain: The diffusion-weighted images demonstrate scattered areas of acute nonhemorrhagic infarct within the right frontal and parietal white matter. There is involvement of the left centrum semi ovale and corona radiata extending anteriorly into border zone regions. Additional punctate cortical areas are present in the left parietal lobe and along the cingulate gyrus. There is no significant involvement of the right hemisphere. There is T2 signal changes associated with the areas of acute/subacute infarction. Remote lacunar infarct of the right centrum semi ovale is noted. Periventricular white matter changes are present on the right as well. The brainstem is normal. Remote left cerebellar  infarct is again seen. Cerebellum is otherwise within normal limits. The internal auditory canals are within normal limits. Vascular: Abnormal signal is present within the vertebral arteries at the foramen magnum bilaterally. There is flow in the distal basilar artery. There is no flow in the left internal carotid artery. Skull and upper cervical spine: Mild degenerative changes are present the upper cervical spine. Skull base is normal. Midline structures are otherwise within normal limits. Sinuses/Orbits: The paranasal sinuses and mastoid air cells are clear. The globes and orbits are within normal limits. MRA HEAD FINDINGS The right internal carotid  artery is within normal limits from the high cervical segments through the ICA termini. A prominent posterior communicating artery is patent. The left internal carotid artery is occluded proximally. Flow is reconstituted in the distal left ICA at the level of the posterior communicating artery. A1 and M1 segments are normal. There is reduced size of left MCA branch vessels compared to the right without a significant focal stenosis or occlusion. There is signal loss in the V4 segments of the vertebral arteries bilaterally, more proximally on the left. There is no flow signal within the proximal basilar artery. There is flow in the distal basilar artery. A prominent right posterior communicating artery is present. Moderate narrowing is present in the left P2 segment. There is mild irregularity of distal PCA branch vessels without a focal stenosis or occlusion. IMPRESSION: 1. Occluded left internal carotid artery with reconstitution of the level of the left posterior communicating artery. Of note, there is narrowing within the left posterior communicating artery. 2. No significant stenosis or occlusion of the right internal carotid artery with a prominent right posterior communicating artery that is feeding the posterior circulation. 3. High-grade stenosis or occlusion of distal vertebral arteries bilaterally and of the proximal basilar artery. Flow is reconstituted in the distal basilar artery, presumably through the right posterior communicating artery. 4. Asymmetric attenuation of left MCA branch vessels without a focal stenosis or protrusion. This likely to represents the proximal occlusion decreased perfusion pressure due. 5. Extensive left hemisphere acute/subacute white matter infarcts involving the left centrum semi ovale and corona radiata as well as border zone areas. This corresponds with the proximal left ICA occlusion. 6. There are scattered punctate foci of cortical infarct involving the anterior left  frontal lobe and left parietal lobe. There is also some involvement of the cingulate gyrus. Electronically Signed   By: Marin Robertshristopher  Mattern M.D.   On: 07/11/2019 16:03   [name]  Result Date: 07/11/2019 CLINICAL DATA:  Altered mental status. Left facial droop. Right-sided weakness. Symptoms for 2 days. EXAM: MRI HEAD WITHOUT CONTRAST MRA HEAD WITHOUT CONTRAST TECHNIQUE: Multiplanar, multiecho pulse sequences of the brain and surrounding structures were obtained without intravenous contrast. Angiographic images of the head were obtained using MRA technique without contrast. COMPARISON:  CT head without contrast 07/10/2019. CTA of the head and neck I/23/19. MRI brain and MRA head 09/07/2018. FINDINGS: MRI HEAD FINDINGS Brain: The diffusion-weighted images demonstrate scattered areas of acute nonhemorrhagic infarct within the right frontal and parietal white matter. There is involvement of the left centrum semi ovale and corona radiata extending anteriorly into border zone regions. Additional punctate cortical areas are present in the left parietal lobe and along the cingulate gyrus. There is no significant involvement of the right hemisphere. There is T2 signal changes associated with the areas of acute/subacute infarction. Remote lacunar infarct of the right centrum semi ovale is noted. Periventricular white matter changes are  present on the right as well. The brainstem is normal. Remote left cerebellar infarct is again seen. Cerebellum is otherwise within normal limits. The internal auditory canals are within normal limits. Vascular: Abnormal signal is present within the vertebral arteries at the foramen magnum bilaterally. There is flow in the distal basilar artery. There is no flow in the left internal carotid artery. Skull and upper cervical spine: Mild degenerative changes are present the upper cervical spine. Skull base is normal. Midline structures are otherwise within normal limits.  Sinuses/Orbits: The paranasal sinuses and mastoid air cells are clear. The globes and orbits are within normal limits. MRA HEAD FINDINGS The right internal carotid artery is within normal limits from the high cervical segments through the ICA termini. A prominent posterior communicating artery is patent. The left internal carotid artery is occluded proximally. Flow is reconstituted in the distal left ICA at the level of the posterior communicating artery. A1 and M1 segments are normal. There is reduced size of left MCA branch vessels compared to the right without a significant focal stenosis or occlusion. There is signal loss in the V4 segments of the vertebral arteries bilaterally, more proximally on the left. There is no flow signal within the proximal basilar artery. There is flow in the distal basilar artery. A prominent right posterior communicating artery is present. Moderate narrowing is present in the left P2 segment. There is mild irregularity of distal PCA branch vessels without a focal stenosis or occlusion. IMPRESSION: 1. Occluded left internal carotid artery with reconstitution of the level of the left posterior communicating artery. Of note, there is narrowing within the left posterior communicating artery. 2. No significant stenosis or occlusion of the right internal carotid artery with a prominent right posterior communicating artery that is feeding the posterior circulation. 3. High-grade stenosis or occlusion of distal vertebral arteries bilaterally and of the proximal basilar artery. Flow is reconstituted in the distal basilar artery, presumably through the right posterior communicating artery. 4. Asymmetric attenuation of left MCA branch vessels without a focal stenosis or protrusion. This likely to represents the proximal occlusion decreased perfusion pressure due. 5. Extensive left hemisphere acute/subacute white matter infarcts involving the left centrum semi ovale and corona radiata as well  as border zone areas. This corresponds with the proximal left ICA occlusion. 6. There are scattered punctate foci of cortical infarct involving the anterior left frontal lobe and left parietal lobe. There is also some involvement of the cingulate gyrus. Electronically Signed   By: Marin Robertshristopher  Mattern M.D.   On: 07/11/2019 16:03   [place] Carotid  Result Date: 07/11/2019 Carotid Arterial Duplex Study Indications:       CVA, Speech disturbance and Weakness. Risk Factors:      Hypertension, prior MI, coronary artery disease. Other Factors:     CVA 08/2018. Comparison Study:  No prior study on file for comparison. Performing Technologist: Sherren Kernsandace Kanady RVS  Examination Guidelines: A complete evaluation includes B-mode imaging, spectral Doppler, color Doppler, and power Doppler as needed of all accessible portions of each vessel. Bilateral testing is considered an integral part of a complete examination. Limited examinations for reoccurring indications may be performed as noted.  Right Carotid Findings: +----------+--------+--------+--------+-----------+------------------+             PSV cm/s EDV cm/s Stenosis Describe    Comments            +----------+--------+--------+--------+-----------+------------------+  CCA Prox   109      23  intimal thickening  +----------+--------+--------+--------+-----------+------------------+  CCA Distal 101      26                            intimal thickening  +----------+--------+--------+--------+-----------+------------------+  ICA Prox   74       35                homogeneous                     +----------+--------+--------+--------+-----------+------------------+  ICA Distal 73       33                                                +----------+--------+--------+--------+-----------+------------------+  ECA        75       13                                                +----------+--------+--------+--------+-----------+------------------+  +----------+--------+-------+--------+-------------------+             PSV cm/s EDV cms Describe Arm Pressure (mmHG)  +----------+--------+-------+--------+-------------------+  Subclavian 63                                             +----------+--------+-------+--------+-------------------+ +---------+--------+--+--------+-+  Vertebral PSV cm/s 26 EDV cm/s 9  +---------+--------+--+--------+-+  Left Carotid Findings: +----------+--------+--------+--------+--------+-----------------------+             PSV cm/s EDV cm/s Stenosis Describe Comments                 +----------+--------+--------+--------+--------+-----------------------+  CCA Prox   82       14                         intimal thickening       +----------+--------+--------+--------+--------+-----------------------+  CCA Distal 62       17                         intimal thickening       +----------+--------+--------+--------+--------+-----------------------+  ICA Prox                     Occluded          soft plaque vs thrombus  +----------+--------+--------+--------+--------+-----------------------+  ECA        139      26                                                  +----------+--------+--------+--------+--------+-----------------------+ +----------+--------+--------+--------+-------------------+  Subclavian PSV cm/s EDV cm/s Describe Arm Pressure (mmHG)  +----------+--------+--------+--------+-------------------+             121                                             +----------+--------+--------+--------+-------------------+ +---------+--------+--+--------+-+  Vertebral PSV cm/s 55 EDV cm/s 4  +---------+--------+--+--------+-+  Summary: Right Carotid: The extracranial vessels were near-normal with only minimal wall                thickening or plaque. Left Carotid: The CCA appears occluded. Vertebrals:  Bilateral vertebral arteries demonstrate antegrade flow. Subclavians: Normal flow hemodynamics were seen in bilateral subclavian               arteries. *See table(s) above for measurements and observations.     Preliminary         Scheduled Meds:  atorvastatin  40 mg Oral q1800   chlorhexidine  15 mL Mouth Rinse BID   clopidogrel  75 mg Oral Daily   mouth rinse  15 mL Mouth Rinse q12n4p   Continuous Infusions:  dextrose 5 % and 0.45% NaCl       LOS: 1 day     Jacquelin Hawking, MD [facility] 07/12/2019, 2:42 PM  If 7PM-7AM, please contact night-coverage [email or web address]

## 2019-07-12 NOTE — Evaluation (Signed)
Clinical/Bedside Swallow Evaluation Patient Details  Name: Zachary Mccann MRN: [id number] Date of Birth: 05/17/1971  Today's Date: 07/12/2019 Time: SLP Start Time (ACUTE ONLY): 1 SLP Stop Time (ACUTE ONLY): 1023 SLP Time Calculation (min) (ACUTE ONLY): 16 min  Past Medical History:  Past Medical History:  Diagnosis Date  . CAD (coronary artery disease)    a. s/p NSTEMI in 04/2018 with DES to LCx.   [name] Hypertension   . Myocardial infarction ([place])   . Pneumonia   . Stroke [name])    Past Surgical History:  Past Surgical History:  Procedure Laterality Date  . CORONARY STENT INTERVENTION N/A 05/05/2018   Procedure: CORONARY STENT INTERVENTION;  Surgeon: Leonie Man, MD;  Location: [place] [facility];  Service: Cardiovascular;  Laterality: N/A;  . LEFT HEART CATH AND CORONARY ANGIOGRAPHY N/A 05/05/2018   Procedure: LEFT HEART CATH AND CORONARY ANGIOGRAPHY;  Surgeon: Leonie Man, MD;  Location: [place] [facility];  Service: Cardiovascular;  Laterality: N/A;  . TEE WITHOUT CARDIOVERSION N/A 09/08/2018   Procedure: TRANSESOPHAGEAL ECHOCARDIOGRAM (TEE);  Surgeon: Larey Dresser, MD;  Location: [facility];  Service: Cardiovascular;  Laterality: N/A;   HPI:  Patient is a 48 y/o male presenting to the ED on 07/10/2019 with primary complaints of slurred speech, R facial droop. MRI extensive left hemisphere acute/subacute white matter infarcts Extensive left hemisphere acute/subacute white matter infarcts involving the left centrum semi ovale and corona radiata. Scattered punctate foci of cortical infarct involving the anterior left frontal lobe and left parietal lobe. Occluded left internal carotid artery with reconstitution of the level of the left posterior communicating artery.  Past medical history significant for CVA, hypertension, CKD 3, coronary artery disease.    Assessment / Plan / Recommendation Clinical Impression  Pt exhibits cranial nerve VII injury resulting in decreased  tone and ROM. Secretions pooled and spilling from right sulci without awareness; suspicion of penetration/aspiration indicated by sudden strong coughs not related to po's. He coughed immediately after ice chip and inconsistently with trials thin. Verbal apraxia present and he was unable to volitionally adduct vocal cords to clear airway. He swallowed 2-3 additional times with thin and puree. PO's are not recommended at present but ST to follow up for instrumental evaluation with MBS or FEES. Discussed with RN and MD.    SLP Visit Diagnosis: Dysphagia, unspecified (R13.10)    Aspiration Risk  Moderate aspiration risk    Diet Recommendation NPO   Postural Changes: Seated upright at 90 degrees    Other  Recommendations Oral Care Recommendations: Oral care QID   Follow up Recommendations Inpatient Rehab      Frequency and Duration min 2x/week  2 weeks       Prognosis Prognosis for Safe Diet Advancement: Good Barriers to Reach Goals: Severity of deficits      Swallow Study   General HPI: Patient is a 48 y/o male presenting to the ED on 07/10/2019 with primary complaints of slurred speech, R facial droop. MRI extensive left hemisphere acute/subacute white matter infarcts Extensive left hemisphere acute/subacute white matter infarcts involving the left centrum semi ovale and corona radiata. Scattered punctate foci of cortical infarct involving the anterior left frontal lobe and left parietal lobe. Occluded left internal carotid artery with reconstitution of the level of the left posterior communicating artery.  Past medical history significant for CVA, hypertension, CKD 3, coronary artery disease.  Type of Study: Bedside Swallow Evaluation Previous Swallow Assessment: (none) Diet Prior to this Study: NPO  Temperature Spikes Noted: No Respiratory Status: Room air History of Recent Intubation: No Behavior/Cognition: Alert;Cooperative;Pleasant mood;Requires cueing Oral Cavity Assessment:  Excessive secretions Oral Care Completed by SLP: Yes Oral Cavity - Dentition: (unable to view posterior) Vision: Functional for self-feeding Self-Feeding Abilities: Needs assist;Needs set up Patient Positioning: Upright in bed Volitional Cough: Other (Comment)(unable d/t apraxia) Volitional Swallow: Unable to elicit    Oral/Motor/Sensory Function Overall Oral Motor/Sensory Function: Moderate impairment Facial ROM: Reduced right;Suspected CN VII (facial) dysfunction Facial Symmetry: Abnormal symmetry right;Suspected CN VII (facial) dysfunction Facial Strength: Reduced right;Suspected CN VII (facial) dysfunction Lingual ROM: (no volitional movements)   Ice Chips Ice chips: Impaired Oral Phase Impairments: Reduced lingual movement/coordination Oral Phase Functional Implications: Oral residue Pharyngeal Phase Impairments: Suspected delayed Swallow;Cough - Immediate   Thin Liquid Thin Liquid: Impaired Presentation: Cup;Spoon Oral Phase Impairments: Reduced labial seal;Reduced lingual movement/coordination Oral Phase Functional Implications: Right anterior spillage;Oral residue Pharyngeal  Phase Impairments: Cough - Immediate;Cough - Delayed;Multiple swallows    Nectar Thick Nectar Thick Liquid: Not tested   Honey Thick Honey Thick Liquid: Not tested   Puree Puree: Impaired Presentation: Spoon Oral Phase Impairments: Reduced lingual movement/coordination Pharyngeal Phase Impairments: Multiple swallows   Solid     Solid: Not tested      Royce Macadamia 07/12/2019,10:55 AM   Breck Coons Lonell Face.Ed Nurse, children's [phone]; weekend [phone] Office [phone]

## 2019-07-12 NOTE — Plan of Care (Signed)
Patient stable, patient unable to participate in POC at this time d/t mentation.

## 2019-07-12 NOTE — Evaluation (Signed)
Speech Language Pathology Evaluation Patient Details Name: Zachary Mccann MRN: [id number] DOB: 1971/04/05 Today's Date: 07/12/2019 Time: 1027-2536 SLP Time Calculation (min) (ACUTE ONLY): 16 min  Problem List:  Patient Active Problem List   Diagnosis Date Noted  . Cerebral embolism with cerebral infarction 07/11/2019  . Acute CVA (cerebrovascular accident) ([place]) 07/11/2019  . Slurred speech 07/10/2019  . Cardiomyopathy ([place]) 11/20/2018  . History of stroke 09/06/2018  . Tobacco use 09/06/2018  . History of non-ST elevation myocardial infarction (NSTEMI) 05/05/2018  . Severe Vitamin D deficiency 04/02/2018  . Community acquired pneumonia of left lower lobe of lung ([place]) 04/02/2018  . CKD (chronic kidney disease), stage III ([place]) = Secondary to Hypertension 04/02/2018  . Pneumonia 03/30/2018  . Metabolic acidosis 64/40/3474  . AKI (acute kidney injury) ([place]) 03/30/2018  . N&V (nausea and vomiting) 03/30/2018  . Essential hypertension 03/30/2018   Past Medical History:  Past Medical History:  Diagnosis Date  . CAD (coronary artery disease)    a. s/p NSTEMI in 04/2018 with DES to LCx.   [name] Hypertension   . Myocardial infarction ([place])   . Pneumonia   . Stroke [facility])    Past Surgical History:  Past Surgical History:  Procedure Laterality Date  . CORONARY STENT INTERVENTION N/A 05/05/2018   Procedure: CORONARY STENT INTERVENTION;  Surgeon: Leonie Man, MD;  Location: [place] [facility];  Service: Cardiovascular;  Laterality: N/A;  . LEFT HEART CATH AND CORONARY ANGIOGRAPHY N/A 05/05/2018   Procedure: LEFT HEART CATH AND CORONARY ANGIOGRAPHY;  Surgeon: Leonie Man, MD;  Location: [place] [facility];  Service: Cardiovascular;  Laterality: N/A;  . TEE WITHOUT CARDIOVERSION N/A 09/08/2018   Procedure: TRANSESOPHAGEAL ECHOCARDIOGRAM (TEE);  Surgeon: Larey Dresser, MD;  Location[facility] END[facility]COPY;  Service: Cardiovascular;  Laterality: N/A;   HPI:  Patient is a 48 y/o male  presenting to the ED on 07/10/2019 with primary complaints of slurred speech, R facial droop. MRI extensive left hemisphere acute/subacute white matter infarcts Extensive left hemisphere acute/subacute white matter infarcts involving the left centrum semi ovale and corona radiata. Scattered punctate foci of cortical infarct involving the anterior left frontal lobe and left parietal lobe. Occluded left internal carotid artery with reconstitution of the level of the left posterior communicating artery.  Past medical history significant for CVA, hypertension, CKD 3, coronary artery disease.    Assessment / Plan / Recommendation Clinical Impression  Pt demonstrates severe oral and verbal apraxia, mild-mod aphasia and cognitive deficits. Spontaneous vocalizations x 2  only during throat clears and occasional sighs; responded to simple yes/no questions He followed 5/6 basic one step directions and required assist  for 2 step. SLP attempted vocal initiation using melodic intonation, imitation, use of familiar songs and automatics without pt response. Yes/no responses are through head gestures with intermittent cues needed to respond. Inattention to right side of environment but is easily stimulable to look right. ST intervention will continue targeting reading and writing abilities (left handed) maximizing ability to have basic needs met while increasing verbal expression and comprehension.         SLP Assessment  SLP Recommendation/Assessment: Patient needs continued Speech Lanaguage Pathology Services SLP Visit Diagnosis: Apraxia (R48.2);Cognitive communication deficit (R41.841);Aphasia (R47.01)    Follow Up Recommendations  Inpatient Rehab    Frequency and Duration min 2x/week  2 weeks      SLP Evaluation Cognition  Overall Cognitive Status: Impaired/Different from baseline Arousal/Alertness: Awake/alert Orientation Level: Oriented to place;Oriented to person(via head gestures)  Attention:  Sustained Sustained Attention: Impaired Sustained Attention Impairment: Functional basic Memory: (continue to address ) Awareness: Impaired Awareness Impairment: Emergent impairment Problem Solving: (continue to assess in diagnostic tx) Safety/Judgment: Impaired       Comprehension  Auditory Comprehension Overall Auditory Comprehension: Impaired Yes/No Questions: (4/4 basic) Commands: Impaired Two Step Basic Commands: 25-49% accurate Interfering Components: Attention;Processing speed Visual Recognition/Discrimination Discrimination: Not tested Reading Comprehension Reading Status: (will evaluate)    Expression Expression Primary Mode of Expression: Nonverbal - gestures Verbal Expression Overall Verbal Expression: Impaired Initiation: Impaired Level of Generative/Spontaneous Verbalization: (spontaneous vocalization during throat clear, sighs ) Repetition: Impaired Level of Impairment: (unable for phoneme) Naming: Impairment Confrontation: Impaired Pragmatics: Impairment Impairments: Abnormal affect;Eye contact Interfering Components: Attention Non-Verbal Means of Communication: Gestures Written Expression Dominant Hand: Left Written Expression: (TBA)   Oral / Motor  Oral Motor/Sensory Function Overall Oral Motor/Sensory Function: Moderate impairment Facial ROM: Reduced right;Suspected CN VII (facial) dysfunction Facial Symmetry: Abnormal symmetry right;Suspected CN VII (facial) dysfunction Facial Strength: Reduced right;Suspected CN VII (facial) dysfunction Lingual ROM: (no volitional movements) Motor Speech Overall Motor Speech: Impaired Phonation: (will continue to assess as verbalizations increase) Intelligibility: Unable to assess (comment) Motor Planning: Impaired Level of Impairment: (phoneme)   GO                    Houston Siren 07/12/2019, 11:26 AM   Orbie Pyo Colvin Caroli.Ed Risk analyst [phone] Office  [phone]

## 2019-07-13 LAB — BASIC METABOLIC PANEL
Anion gap: 10 (ref 5–15)
BUN: 17 mg/dL (ref 6–20)
CO2: 20 mmol/L — ABNORMAL LOW (ref 22–32)
Calcium: 8.9 mg/dL (ref 8.9–10.3)
Chloride: 109 mmol/L (ref 98–111)
Creatinine, Ser: 1.56 mg/dL — ABNORMAL HIGH (ref 0.61–1.24)
GFR calc Af Amer: 60 mL/min — ABNORMAL LOW (ref >60)
GFR calc non Af Amer: 52 mL/min — ABNORMAL LOW (ref >60)
Glucose, Bld: 106 mg/dL — ABNORMAL HIGH (ref 70–99)
Potassium: 4 mmol/L (ref 3.5–5.1)
Sodium: 139 mmol/L (ref 135–145)

## 2019-07-13 LAB — GLUCOSE, CAPILLARY: Glucose-Capillary: 97 mg/dL (ref 70–99)

## 2019-07-13 MED ORDER — CARVEDILOL 3.125 MG PO TABS
3.1250 mg | ORAL_TABLET | Freq: Two times a day (BID) | ORAL | Status: DC
  Administered 2019-07-14 – 2019-07-17 (×7): 3.125 mg via ORAL
  Filled 2019-07-13 (×7): qty 1

## 2019-07-13 NOTE — Plan of Care (Signed)
  Problem: Health Behavior/Discharge Planning: Goal: Ability to manage health-related needs will improve Outcome: Progressing   Problem: Clinical Measurements: Goal: Ability to maintain clinical measurements within normal limits will improve Outcome: Progressing Goal: Will remain free from infection Outcome: Progressing Goal: Diagnostic test results will improve Outcome: Progressing Goal: Respiratory complications will improve Outcome: Progressing   Problem: Elimination: Goal: Will not experience complications related to bowel motility Outcome: Progressing   Problem: Safety: Goal: Ability to remain free from injury will improve Outcome: Progressing   Problem: Skin Integrity: Goal: Risk for impaired skin integrity will decrease Outcome: Progressing

## 2019-07-13 NOTE — Progress Notes (Signed)
  Speech Language Pathology Treatment: Dysphagia;Cognitive-Linquistic  Patient Details Name: Zachary Mccann MRN: [id number] DOB: 1971/09/28 Today's Date: 07/13/2019 Time: 7989-2119 SLP Time Calculation (min) (ACUTE ONLY): 41 min  Assessment / Plan / Recommendation Clinical Impression  Pt sitting in bed, generally responds to questions with thumbs-up signal for yes, less likely to use head nod/shake. He continues to demonstrate a severe oral/speech apraxia with no preserved ability to follow oral-motor directions.  He demonstrates preserved limb praxis, demonstrating correct actions for hitchhiking, using a screwdriver, saluting, for example, but unable to engage the oral mechanism in equitable tasks. Despite maximal visual/verbal/tactile cues, no voluntary nor involuntary voicing or speech could be elicited.  Pt was able to write simple words to dictation, as well as answer simple questions through printed response.  Use of a letter board to construct words was more difficult, but achievable, and pt may be able to use a letter board or use his phone for texting as a viable communication tool with practice/time.    Swallowing continues to be quite impaired - pt showed minimal oral response/manipulation of ice chips.  He generated intermittent spontaneous swallow; this appeared to be delayed, and there was explosive coughing that followed accumulated boluses of ice chips and teaspoons of thin liquids.  Pt will benefit from an MBS to determine nature of deficits; he will likely need alternative enteral feeding (cortrak) given that return of functional swallow may take some time.  Discussed this recommendation with the pt, who frowned but gave thumbs up when I explained the time that will be needed for swallow recovery.   White Psychologist, clinical with marker were left at bedside and within reach.  SLP will continue to follow for communication and swallowing.     HPI HPI: Patient is a 48 y/o  male presenting to the ED on 07/10/2019 with primary complaints of slurred speech, R facial droop. MRI extensive left hemisphere acute/subacute white matter infarcts Extensive left hemisphere acute/subacute white matter infarcts involving the left centrum semi ovale and corona radiata. Scattered punctate foci of cortical infarct involving the anterior left frontal lobe and left parietal lobe. Occluded left internal carotid artery with reconstitution of the level of the left posterior communicating artery.  Past medical history significant for CVA, hypertension, CKD 3, coronary artery disease.       SLP Plan  Continue with current plan of care;MBS       Recommendations  Diet recommendations: NPO                Oral Care Recommendations: Oral care QID Follow up Recommendations: Inpatient Rehab SLP Visit Diagnosis: Apraxia (R48.2);Dysphagia, unspecified (R13.10) Plan: Continue with current plan of care;MBS       Brigido Mera L. Tivis Ringer, [place] Office number [phone] Pager [phone]                 Zachary Mccann 07/13/2019, 11:57 AM

## 2019-07-13 NOTE — Progress Notes (Signed)
Physical Therapy Treatment Patient Details Name: Zachary Mccann MRN: [id number] DOB: Jun 20, 1971 Today's Date: 07/13/2019    History of Present Illness Patient is a 48 y/o male presenting to the ED on 07/10/2019 with primary complaints of slurred speech, R facial droop. CT head-unremarkable. MRI pending. Past medical history significant for CVA, hypertension, CKD 3, coronary artery disease.     PT Comments    Patient seen to progress OOB mobility. Patient agreeable to mobility with patient writing "going outside" - PT redirecting patient. Patient non-verbal in session with multimodal cueing in session to complete each task successfully. Increased level of assist required today for functional mobility (Max A +2 for sit to stand and pivot towards recliner). Limited weight shifting/stepping of R LE with reduced awareness of R UE/LE.  PT to continue to follow.    Follow Up Recommendations  CIR     Equipment Recommendations  Other (comment)(TBD)    Recommendations for Other Services Rehab consult     Precautions / Restrictions Precautions Precautions: Fall Restrictions Weight Bearing Restrictions: No    Mobility  Bed Mobility Overal bed mobility: Needs Assistance Bed Mobility: Supine to Sit     Supine to sit: Mod assist     General bed mobility comments: Mod A to come to EOB; PT assist to bring R LE towards EOB as well as for trunk support to come into sitting; increased time for problem solving  Transfers Overall transfer level: Needs assistance Equipment used: Rolling walker (2 wheeled) Transfers: Sit to/from [employer] Transfers Sit to Stand: Max assist;+2 physical assistance Stand pivot transfers: Max assist;+2 physical assistance       General transfer comment: Max A +2 to stand from bedside and take small steps for pivot transfer; reduced standing balance; poor awareness of R UE/LE; heavy tactile/verbal cueing for sequencing  Ambulation/Gait              General Gait Details: patient taking small shuffle steps towards recliner; requires heavy verbal/tactile cueing for sequencing; limited ability to lift R LE off of ground for gait training; overall poor awareness of R LE   Stairs             Wheelchair Mobility    Modified Rankin (Stroke Patients Only) Modified Rankin (Stroke Patients Only) Pre-Morbid Rankin Score: No significant disability Modified Rankin: Severe disability     Balance Overall balance assessment: Needs assistance Sitting-balance support: Single extremity supported;Feet supported Sitting balance-Leahy Scale: Fair     Standing balance support: Bilateral upper extremity supported;During functional activity Standing balance-Leahy Scale: Poor Standing balance comment: up to Max A for standing balance                            Cognition Arousal/Alertness: Awake/alert Behavior During Therapy: Flat affect Overall Cognitive Status: Impaired/Different from baseline Area of Impairment: Following commands;Safety/judgement;Problem solving                       Following Commands: Follows one step commands inconsistently;Follows one step commands with increased time Safety/Judgement: Decreased awareness of safety;Decreased awareness of deficits   Problem Solving: Slow processing;Decreased initiation;Difficulty sequencing;Requires verbal cues        Exercises      General Comments General comments (skin integrity, edema, etc.): nurse tech assisting with mobility      Pertinent Vitals/Pain Pain Assessment: Faces Faces Pain Scale: No hurt    Home Living  Prior Function            PT Goals (current goals can now be found in the care plan section) Acute Rehab PT Goals Patient Stated Goal: did not state  PT Goal Formulation: With patient Time For Goal Achievement: 07/25/19 Potential to Achieve Goals: Good Progress towards PT goals: Progressing toward  goals    Frequency    Min 4X/week      PT Plan Current plan remains appropriate    Co-evaluation              AM-PAC PT "6 Clicks" Mobility   Outcome Measure  Help needed turning from your back to your side while in a flat bed without using bedrails?: A Little Help needed moving from lying on your back to sitting on the side of a flat bed without using bedrails?: A Lot Help needed moving to and from a bed to a chair (including a wheelchair)?: A Lot Help needed standing up from a chair using your arms (e.g., wheelchair or bedside chair)?: A Lot Help needed to walk in hospital room?: Total Help needed climbing 3-5 steps with a railing? : Total 6 Click Score: 11    End of Session Equipment Utilized During Treatment: Gait belt Activity Tolerance: Patient tolerated treatment well Patient left: in chair;with call bell/phone within reach;with chair alarm set;with nursing/sitter in room Nurse Communication: Mobility status PT Visit Diagnosis: Unsteadiness on feet (R26.81);Other abnormalities of gait and mobility (R26.89);Muscle weakness (generalized) (M62.81)     Time: 4268-3419 PT Time Calculation (min) (ACUTE ONLY): 26 min  Charges:  $Therapeutic Activity: 23-37 mins                      Lanney Gins, PT, DPT Supplemental Physical Therapist 07/13/19 2:08 PM Pager: [phone] Office: [phone]

## 2019-07-13 NOTE — Progress Notes (Signed)
Patient's oldest brother Caspian Deleonardis called at [phone] and said that  him and his sister will make decision on his brother's care. He also stated that nobody can be there for him 24/7 after discharge so his brother will have to go to a SNF. Claiborne Billings can call Laveda Abbe anytime tomorrow at: [employer] [phone], home [phone]. He said he works night shift so he will be able to answer your call.

## 2019-07-13 NOTE — Progress Notes (Signed)
PROGRESS NOTE  Zachary Mccann RCB:[id number] DOB: [birth year] DOA: 07/10/2019 PCP: [name], NP  Brief History   Zachary Mccann is a 48 y.o. male with medical history significant forCVA, hypertension, CKD 3, coronary artery disease. Patient presented secondary to slurred speech, facial droop and lethargy and presents with symptoms likely related to acute stroke.  Consultants  . Inpatient rehab . Neurology  Procedures  . TTE  Subjective  The patient is resting comfortably.   Objective   Vitals:  Vitals:   07/13/19 0843 07/13/19 1136  BP: 138/88 139/82  Pulse: 77 84  Resp: 20 20  Temp: 98.8 F (37.1 C) 97.9 F (36.6 C)  SpO2: 99% 100%    Exam:  Constitutional:  . The patient is awake and alert. He is non-verbal. No acute distress. Respiratory:  . No increased work of breathing . No wheezes, rales, or rhonchi. . No tactile fremitus. Cardiovascular:  . Regular rate and rhythm . No murmurs, ectopy, or gallups. . No lateral PMI. No thrills. Abdomen:  . Abdomen is soft, non-tender, non-distended . No hernias, masses, or organomegaly . Normoactive bowel sounds. Musculoskeletal:  . No cyanosis, clubbing, or edema Skin:  . No rashes, lesions, ulcers . palpation of skin: no induration or nodules Neurologic:  . CN 2-12 intact . Left upper extremity MS is 1/5. LLE MS is 1/5. Psychiatric: Unable to evaluate due to the patient's inability to cooperate with exam.   I have personally reviewed the following:   Today's Data  . Vitals, BMP, lipids  Imaging  . MRI/MRA, or Carotid US  Cardiology Data  . Echocardiogram  Scheduled Meds: . atorvastatin  40 mg Oral q1800  . chlorhexidine  15 mL Mouth Rinse BID  . clopidogrel  75 mg Oral Daily  . mouth rinse  15 mL Mouth Rinse q12n4p   Continuous Infusions: . dextrose 5 % and 0.9% NaCl 75 mL/hr at 07/13/19 8032    A & P   Principal Problem:   Slurred speech Active Problems:   Essential hypertension    CKD (chronic kidney disease), stage III (HCC) = Secondary to Hypertension   History of non-ST elevation myocardial infarction (NSTEMI)   Cardiomyopathy (HCC)   Cerebral embolism with cerebral infarction   Acute CVA (cerebrovascular accident) (HCC)   LOS: 2 days   CVA: Left MCA and ACA scattered infarct due to left ICA occlusion, large vessel disease. The patient has dense expressive aphasia and right hemiparesis. Confirmed on MRI. MRA demonstrated a left ICA occlusion with bilateral vertebral artery distal occlusion, and basilar artery proximal occlusion with distal retrograde recon from PCOM. Carotid Doppler demonstrates a left ICA/CCA occlusion. Echocardiogram demonstrated a 40% LVEF and no  Intracardiac source of embolus. He is COVID-19 negative. LDL is 90. HgbA1c is 5.6. He is receiving oral plavix and ASA. UDS was positive for THCU.  He has been evaluated by PT/OT who has recommended CIR for rehab. They have been consulted.  Extra and intracranial stenosis and occlusion: Consistent with large vessel disease. He is on Plavix.  Expressive aphasia/Dysphagia: SLP has been consulted and is working with the patient. He remains NPO. Cortrak has been ordered for provision of nutrition. Dietician has been consulted for tube feed recommendations.  Metabolic encephalopathy: Secondary to acute CVA and AKI. Difficult to ascertain due to patient's non-verbal state, although he does appear to be able to follow commands and to understand speech.  AKI on CKD III: Slowly improving. Likely at baseline creatinine.  CAD:  Continue lipitor, plavix, ASAA, and crestor. Stable.  CHF: ACE I avoided due to CKDIII.  I have seen and examined this patient myself. I have spent 35 minutes in his evaluation and care.  DVT prophylaxis: SCDs Code Status:   Code Status: Full Code Family Communication: None at bedside Disposition Plan: Discharge likely to CIR pending neuro workup  Davidjames Blansett, DO [facility]  Direct contact: see [email or web address]  7PM-7AM contact night coverage as above 07/13/2019, 3:38 PM  LOS: 2 days

## 2019-07-13 NOTE — Progress Notes (Signed)
Inpatient Rehab Admissions:  Inpatient Rehab Consult received.  I met with pt at the bedside for rehabilitation assessment. Due to pt condition, it was difficult to assess understanding of program details and preference for rehab venue. AC reached out to pt's significant other, [name] to discuss program details, as pt clearly indicated (via pointing) who he wanted me to call. [name] appeared interested in CIR but had questions about Disability and cost of care. AC will Bank[employer] for assistance with this case. Per [name], pt has applied for disability/Medicaid a few months back but never heard anything. Will need assistance from [facility] for follow up.   AC will also need to confirm caregiver support available at DC. Will follow up with pt/family tomorrow.   Jhonnie Garner, OTR/L  Rehab Admissions Coordinator  [phone] 07/13/2019 6:24 PM

## 2019-07-14 ENCOUNTER — Inpatient Hospital Stay (HOSPITAL_COMMUNITY): Payer: Medicaid Other

## 2019-07-14 LAB — BASIC METABOLIC PANEL
Anion gap: 10 (ref 5–15)
BUN: 13 mg/dL (ref 6–20)
CO2: 19 mmol/L — ABNORMAL LOW (ref 22–32)
Calcium: 9 mg/dL (ref 8.9–10.3)
Chloride: 109 mmol/L (ref 98–111)
Creatinine, Ser: 1.53 mg/dL — ABNORMAL HIGH (ref 0.61–1.24)
GFR calc Af Amer: >60 mL/min (ref >60)
GFR calc non Af Amer: 53 mL/min — ABNORMAL LOW (ref >60)
Glucose, Bld: 109 mg/dL — ABNORMAL HIGH (ref 70–99)
Potassium: 3.7 mmol/L (ref 3.5–5.1)
Sodium: 138 mmol/L (ref 135–145)

## 2019-07-14 MED ORDER — ENSURE ENLIVE PO LIQD
237.0000 mL | Freq: Three times a day (TID) | ORAL | Status: DC
  Administered 2019-07-16 – 2019-07-17 (×3): 237 mL via ORAL

## 2019-07-14 MED ORDER — RESOURCE THICKENUP CLEAR PO POWD
Freq: Once | ORAL | Status: AC
  Administered 2019-07-14: 13:00:00 via ORAL
  Filled 2019-07-14: qty 125

## 2019-07-14 NOTE — Progress Notes (Signed)
OT Treatment Note  Pt with significant deficits as listed below. Excellent participation. Appears apraxic., but demonstrates improved ability to complete tasks with tactile cues and repetition. Facilitation used to mobilize to EOB and progress to recliner using lateral scoot transfer technique toward strong L side. Excellent CIR candidate. Continue to recommend CIR for rehab.     07/14/19 1134  OT Visit Information  Last OT Received On 07/14/19  Assistance Needed +2  PT/OT/SLP Co-Evaluation/Treatment Yes  Reason for Co-Treatment Complexity of the patient's impairments (multi-system involvement);For patient/therapist safety;To address functional/ADL transfers  OT goals addressed during session ADL's and self-care;Strengthening/ROM  History of Present Illness Patient is a 48 y/o male presenting to the ED on 07/10/2019 with primary complaints of slurred speech, R facial droop. CT head-unremarkable. MRI pending. Past medical history significant for CVA, hypertension, CKD 3, coronary artery disease.   Precautions  Precautions Fall  Pain Assessment  Pain Assessment No/denies pain  Cognition  Arousal/Alertness Awake/alert  Behavior During Therapy Flat affect  Overall Cognitive Status Impaired/Different from baseline  Area of Impairment Following commands;Safety/judgement;Problem solving  Current Attention Level Sustained  Following Commands Follows one step commands inconsistently;Follows one step commands with increased time  Safety/Judgement Decreased awareness of safety;Decreased awareness of deficits  Problem Solving Slow processing;Decreased initiation;Difficulty sequencing;Requires verbal cues;Requires tactile cues  General Comments increaed time for processing; tends to use "thumbs up/down" - redirected to message board; apparent motor planning deficits  Difficult to assess due to Impaired communication  Upper Extremity Assessment  Upper Extremity Assessment RUE deficits/detail  RUE  Deficits / Details initiation ofmovement, greater promially; apparent sensory motor deficits; not using functionally at this time; cues to attempt to use  RUE Sensation  (most lkely impaired)  RUE Coordination decreased fine motor;decreased gross motor  Lower Extremity Assessment  Lower Extremity Assessment Defer to PT evaluation  ADL  Overall ADL's  Needs assistance/impaired  Eating/Feeding NPO  Grooming Oral care;Minimal assistance;Sitting  Grooming Details (indicate cue type and reason) hand over hand to initiate; required assistnace to place toothbrush in mouth  Lower Body Bathing Maximal assistance;Sit to/from stand  Lower Body Bathing Details (indicate cue type and reason) Able to stand while theraist assisted with pericare  Functional mobility during ADLs Moderate assistance;+2 for physical assistance;Rolling walker  Bed Mobility  Overal bed mobility Needs Assistance  Bed Mobility Supine to Sit  Supine to sit Mod assist;+2 for physical assistance  General bed mobility comments education on hooking L LE under R LE to aide with EOB sitting; Mod A at trunk for sitting balance  Balance  Overall balance assessment Needs assistance  Sitting-balance support Single extremity supported;Feet supported  Sitting balance-Leahy Scale Fair  Standing balance support Bilateral upper extremity supported;During functional activity  Standing balance-Leahy Scale Poor  Standing balance comment up to Max A for standing balance  Restrictions  Weight Bearing Restrictions No  Transfers  Overall transfer level Needs assistance  Equipment used Rolling walker (2 wheeled)  Transfers Sit to/from Stand;Lateral/Scoot Transfers  Sit to Stand Mod assist;Max assist;+2 physical assistance   Lateral/Scoot Transfers Mod assist;Max assist;+2 physical assistance (block RLE due to extensor pattern)  General transfer comment patient initiating scoot transfer towards L (strong side); requires use of gait belt, R knee  blocking and positioning with cueing for sequencing; sit to stand at reclienr for pericare with Mod A +2 for standing balance  OT - End of Session  Equipment Utilized During Treatment Gait belt;Rolling walker  Activity Tolerance Patient tolerated treatment well  Patient left  in chair;with call bell/phone within reach;with chair alarm set  Nurse Communication Mobility status  OT Assessment/Plan  OT Plan Discharge plan remains appropriate  OT Visit Diagnosis Unsteadiness on feet (R26.81);Hemiplegia and hemiparesis  Hemiplegia - Right/Left Right  Hemiplegia - dominant/non-dominant Non-Dominant  Hemiplegia - caused by Cerebral infarction  OT Frequency (ACUTE ONLY) Min 2X/week  Recommendations for Other Services Rehab consult  Follow Up Recommendations CIR  OT Equipment None recommended by OT  AM-PAC OT "6 Clicks" Daily Activity Outcome Measure (Version 2)  Help from another person eating meals? 3  Help from another person taking care of personal grooming? 3  Help from another person toileting, which includes using toliet, bedpan, or urinal? 2  Help from another person bathing (including washing, rinsing, drying)? 2  Help from another person to put on and taking off regular upper body clothing? 2  Help from another person to put on and taking off regular lower body clothing? 1  6 Click Score 13  OT Goal Progression  Progress towards OT goals Progressing toward goals  Acute Rehab OT Goals  Patient Stated Goal did not state   OT Goal Formulation With patient  Time For Goal Achievement 07/25/19  Potential to Achieve Goals Good  ADL Goals  Pt Will Perform Grooming with set-up;sitting  Pt Will Perform Upper Body Bathing with set-up;with supervision;sitting  Pt Will Perform Lower Body Bathing with mod assist;sit to/from stand  Pt Will Perform Upper Body Dressing with min assist;sitting  Pt Will Perform Lower Body Dressing with mod assist;sit to/from stand  Pt Will Transfer to Toilet with  mod assist;stand pivot transfer;bedside commode  Pt Will Perform Toileting - Clothing Manipulation and hygiene with mod assist;sit to/from stand  Additional ADL Goal #1 Pt will reach for objects at 120* shoulder flexion Rt uE  OT Time Calculation  OT Start Time (ACUTE ONLY) 1010  OT Stop Time (ACUTE ONLY) 1045  OT Time Calculation (min) 35 min  OT General Charges  $OT Visit 1 Visit  OT Treatments  $Self Care/Home Management  8-22 mins  Maurie Boettcher, OT/L   Acute OT Clinical Specialist [place] Pager [phone] Office [phone]

## 2019-07-14 NOTE — Progress Notes (Signed)
Modified Barium Swallow Progress Note  Patient Details  Name: Zachary Mccann MRN: [id number] Date of Birth: July 28, 1971  Today's Date: 07/14/2019  Modified Barium Swallow completed.  Full report located under Chart Review in the Imaging Section.  Brief recommendations include the following:  Clinical Impression  Pt presents with a primary severe oral dysphagia secondary to apraxia and focal CN weakness.  There are significant deficits in oral control and sequencing of motor movements, as well as sensory loss on right side.  Pt unable to coordinate use of a straw; boluses were offered from cup edge and spoon.  Liquid barium tends to spill immediately from right side of mouth.  There is limited lingual movement, requiring pt to extend neck in order to allow gravity to help propel material into pharynx.  Liquids tend to move passively into throat; purees are moved with weak tongue pumping.  However, once material reaches sub-vallecular space, pt achieves effective laryngeal vestibule closure and transitions POs through UES with no residue post-swallow.  Due to deficits in timing, there were two occasions when thin liquids entered the larynx before the onset of the swallow, leading to trace aspiration with a delayed cough response.  At this time, recommend initiating a full liquid diet, thickened to a nectar consistency.  Pt will need 1:1 assistance to eat safely, with cues to tilt head back and assistance to prevent oral leakage.  He will benefit from a calorie count, as primary issue will be consumption of adequate calories.  I have spoken with Sidonie Dickens, RD, who is following Mr. Rhew today.  SLP will follow for safety, therapeutic exercise, and diet advancement as warranted.    Swallow Evaluation Recommendations       SLP Diet Recommendations: Nectar thick liquid   Liquid Administration via: Cup;Spoon   Medication Administration: Crushed with puree       Compensations: Minimize  environmental distractions;Small sips/bites;Monitor for anterior loss       Oral Care Recommendations: Oral care BID   Other Recommendations: Order thickener from [employer]. Tivis Ringer, [place] Office number [phone] Pager [phone]   [name] 07/14/2019,11:23 AM

## 2019-07-14 NOTE — Progress Notes (Signed)
Physical Therapy Treatment Patient Details Name: Zachary Mccann MRN: [id number] DOB: [birth year] Today's Date: 07/14/2019    History of Present Illness Patient is a 48 y/o male presenting to the ED on 07/10/2019 with primary complaints of slurred speech, R facial droop. CT head-unremarkable. MRI pending. Past medical history significant for CVA, hypertension, CKD 3, coronary artery disease.     PT Comments    Patient seen for mobility progression. Use of L LE to assist with R LE for bed level mobility as well as Mod A at trunk to come into sitting. Patient initiating lateral scoot transfer to L side, however does require assist at trunk and for blocking of R LE and general set-up placement. Cueing throughout session to bring attention to R side. Sit to stand at recliner with Mod/Max A +2 to power up and for standing balance with peri-care. Will continue to follow.    Follow Up Recommendations  CIR     Equipment Recommendations  Other (comment)(TBD)    Recommendations for Other Services Rehab consult     Precautions / Restrictions Precautions Precautions: Fall Restrictions Weight Bearing Restrictions: No    Mobility  Bed Mobility Overal bed mobility: Needs Assistance Bed Mobility: Supine to Sit     Supine to sit: Mod assist;+2 for physical assistance     General bed mobility comments: education on hooking L LE under R LE to aide with EOB sitting; Mod A at trunk for sitting balance  Transfers Overall transfer level: Needs assistance Equipment used: Rolling walker (2 wheeled) Transfers: Sit to/from Stand;Lateral/Scoot Transfers Sit to Stand: Mod assist;Max assist;+2 physical assistance        Lateral/Scoot Transfers: Mod assist;Max assist;+2 physical assistance General transfer comment: patient initiating scoot transfer towards L (strong side); requires use of gait belt, R knee blocking and positioning with cueing for sequencing; sit to stand at reclienr for pericare with  Mod A +2 for standing balance  Ambulation/Gait             General Gait Details: deferred   Stairs             Wheelchair Mobility    Modified Rankin (Stroke Patients Only) Modified Rankin (Stroke Patients Only) Pre-Morbid Rankin Score: No significant disability Modified Rankin: Severe disability     Balance Overall balance assessment: Needs assistance Sitting-balance support: Single extremity supported;Feet supported Sitting balance-Leahy Scale: Fair     Standing balance support: Bilateral upper extremity supported;During functional activity Standing balance-Leahy Scale: Poor Standing balance comment: up to Max A for standing balance                            Cognition Arousal/Alertness: Awake/alert Behavior During Therapy: Flat affect Overall Cognitive Status: Impaired/Different from baseline Area of Impairment: Following commands;Safety/judgement;Problem solving                       Following Commands: Follows one step commands inconsistently;Follows one step commands with increased time Safety/Judgement: Decreased awareness of safety;Decreased awareness of deficits   Problem Solving: Slow processing;Decreased initiation;Difficulty sequencing;Requires verbal cues;Requires tactile cues General Comments: increaed time for processing; tends to use "thumbs up/down" - redirected to message board      Exercises      General Comments General comments (skin integrity, edema, etc.): education on suction       Pertinent Vitals/Pain Pain Assessment: No/denies pain    Home Living  Prior Function            PT Goals (current goals can now be found in the care plan section) Acute Rehab PT Goals Patient Stated Goal: did not state  PT Goal Formulation: With patient Time For Goal Achievement: 07/25/19 Potential to Achieve Goals: Good Progress towards PT goals: Progressing toward goals     Frequency    Min 4X/week      PT Plan Current plan remains appropriate    Co-evaluation PT/OT/SLP Co-Evaluation/Treatment: Yes Reason for Co-Treatment: Complexity of the patient's impairments (multi-system involvement);For patient/therapist safety;Necessary to address cognition/behavior during functional activity;To address functional/ADL transfers PT goals addressed during session: Mobility/safety with mobility;Balance;Proper use of DME        AM-PAC PT "6 Clicks" Mobility   Outcome Measure  Help needed turning from your back to your side while in a flat bed without using bedrails?: A Lot Help needed moving from lying on your back to sitting on the side of a flat bed without using bedrails?: A Lot Help needed moving to and from a bed to a chair (including a wheelchair)?: A Lot Help needed standing up from a chair using your arms (e.g., wheelchair or bedside chair)?: A Lot Help needed to walk in hospital room?: Total Help needed climbing 3-5 steps with a railing? : Total 6 Click Score: 10    End of Session Equipment Utilized During Treatment: Gait belt Activity Tolerance: Patient tolerated treatment well Patient left: in chair;with call bell/phone within reach;with chair alarm set Nurse Communication: Mobility status PT Visit Diagnosis: Unsteadiness on feet (R26.81);Other abnormalities of gait and mobility (R26.89);Muscle weakness (generalized) (M62.81)     Time: 1010-1046 PT Time Calculation (min) (ACUTE ONLY): 36 min  Charges:  $Therapeutic Activity: 8-22 mins                     Lanney Gins, PT, DPT Supplemental Physical Therapist 07/14/19 11:28 AM Pager: [phone] Office: [phone]

## 2019-07-14 NOTE — Progress Notes (Signed)
CSW acknowledging consult for patient positive for THC. Currently, patient nonverbal with severe expressive aphasia, unable to appropriately engage in counseling discussion. Should patient become appropriate for counseling discussion, please consult CSW again.  Laveda Abbe, [place] Clinical Social Worker [phone]

## 2019-07-14 NOTE — Progress Notes (Signed)
Inpatient Rehabilitation-Admissions Coordinator   [facility] has spoken to pt's sister, cousin, and fiance regarding rehab preference. After multiple discussions today, they have agreed to pursue CIR-with all in agreement. However, when [facility] met with pt at the bedside this afternoon, he indicates he wants to go home and does not want to go to rehab. AC presented the option of rehab multiple ways using written and verbal presentation with pt indicaiting with gestures that he does not want to go to rehab. AC will return tomorrow to continue discussion with pt. Question the extent of his understanding of information presented due to aphasia/pt condition.   Will follow up tomorrow.   Jhonnie Garner, OTR/L  Rehab Admissions Coordinator  [phone] 07/14/2019 5:00 PM

## 2019-07-14 NOTE — Progress Notes (Signed)
Initial Nutrition Assessment   RD working remotely.  DOCUMENTATION CODES:   Morbid obesity  INTERVENTION:  Provide Ensure Enlive po TID (thickened to nectar thick consistency), each supplement provides 350 kcal and 20 grams of protein.  Provide Magic cup TID with meals, each supplement provides 290 kcal and 9 grams of protein.  Encourage adequate PO intake.   NUTRITION DIAGNOSIS:   Inadequate oral intake related to dysphagia as evidenced by (liquid diet).  GOAL:   Patient will meet greater than or equal to 90% of their needs  MONITOR:   PO intake, Supplement acceptance, Diet advancement, Skin, Weight trends, Labs, I & O's  REASON FOR ASSESSMENT:   Other (Comment)(nutritional supplements)    ASSESSMENT:   48 y.o. male with medical history significant for CVA, hypertension, CKD 3, coronary artery disease. Patient presented secondary to slurred speech, facial droop and lethargy and presents with symptoms likely related to acute stroke.  Diet has been advanced to a full liquid diet with nectar thick consistency. Cortrak consult for potential tube feeding has been discontinued. RD to order nutritional supplements to aid in caloric and protein needs and will monitor for tolerance and adequacy of nutrition status.   Unable to complete Nutrition-Focused physical exam at this time.   Labs and medications reviewed.   Diet Order:   Diet Order            Diet full liquid Room service appropriate? Yes; Fluid consistency: Nectar Thick  Diet effective now              EDUCATION NEEDS:   Not appropriate for education at this time  Skin:  Skin Assessment: Reviewed RN Assessment  Last BM:  7/27  Height:   Ht Readings from Last 1 Encounters:  07/10/19 6' (1.829 m)    Weight:   Wt Readings from Last 1 Encounters:  07/10/19 (!) 147 kg    Ideal Body Weight:  80.9 kg  BMI:  Body mass index is 43.95 kg/m.  Estimated Nutritional Needs:   Kcal:   2050-2250  Protein:  100-115 grams  Fluid:  >/= 2 L/day    Corrin Parker, MS, RD, LDN Pager # [phone] After hours/ weekend pager # [phone]

## 2019-07-14 NOTE — Consult Note (Signed)
   [facility] CM Inpatient Consult   07/14/2019  Zachary Mccann [birth year] [id number]  Patient screened for potential need for restart of services. 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 Spoke with [name]   Has applied for Medicaid.  She states he lost his insurance when he lost his job and has applied to [employer] and disability. Explained that he was listed with [employer] commercial and she confirms no longer has this insurance.  No longer eligible for [facility] Care Management services.   Plan:  Patient is not eligible for [facility] Care Management at this time.   For questions contact:   Natividad Brood, RN BSN [facility] Liaison  [phone] business mobile phone Toll free office [phone]  Fax number: [phone] [email or web address]

## 2019-07-14 NOTE — Progress Notes (Signed)
PROGRESS NOTE  Zachary Mccann JXB:[id number] DOB: 09/21/1971 DOA: 07/10/2019 PCP: [name], NP  Brief History   Zachary Mccann is a 48 y.o. male with medical history significant forCVA, hypertension, CKD 3, coronary artery disease. Patient presented secondary to slurred speech, facial droop and lethargy and presents with symptoms likely related to acute stroke.  The patient has been re-evaluated by SLP and they want to advance his diet to nectar thick liquid via spoon and medications crushed with puree.  Consultants  . Inpatient rehab . Neurology  Procedures  . TTE  Subjective  The patient is resting comfortably.   Objective   Vitals:  Vitals:   07/14/19 0404 07/14/19 1100  BP: (!) 155/94 (!) 152/93  Pulse: 91 79  Resp: 19 19  Temp: 98.9 F (37.2 C) 98.5 F (36.9 C)  SpO2: 100% 97%    Exam:  Constitutional:  . The patient is awake and alert. He is non-verbal, but communicative. No acute distress. Respiratory:  . No increased work of breathing . No wheezes, rales, or rhonchi. . No tactile fremitus. Cardiovascular:  . Regular rate and rhythm . No murmurs, ectopy, or gallups. . No lateral PMI. No thrills. Abdomen:  . Abdomen is soft, non-tender, non-distended . No hernias, masses, or organomegaly . Normoactive bowel sounds. Musculoskeletal:  . No cyanosis, clubbing, or edema Skin:  . No rashes, lesions, ulcers . palpation of skin: no induration or nodules Neurologic:  . CN 2-12 intact . Left upper extremity MS is 1/5. LLE MS is 1/5. Psychiatric: Unable to evaluate due to the patient's inability to cooperate with exam.   I have personally reviewed the following:   Today's Data  . Vitals, BMP, lipids  Imaging  . MRI/MRA, or Carotid US  Cardiology Data  . Echocardiogram  Scheduled Meds: . atorvastatin  40 mg Oral q1800  . carvedilol  3.125 mg Oral BID WC  . chlorhexidine  15 mL Mouth Rinse BID  . clopidogrel  75 mg Oral Daily  . mouth  rinse  15 mL Mouth Rinse q12n4p   Continuous Infusions: . dextrose 5 % and 0.9% NaCl 75 mL/hr at 07/13/19 1916    A & P   Principal Problem:   Slurred speech Active Problems:   Essential hypertension   CKD (chronic kidney disease), stage III (HCC) = Secondary to Hypertension   History of non-ST elevation myocardial infarction (NSTEMI)   Cardiomyopathy (HCC)   Cerebral embolism with cerebral infarction   Acute CVA (cerebrovascular accident) (HCC)   LOS: 3 days   CVA: Left MCA and ACA scattered infarct due to left ICA occlusion, large vessel disease. The patient has dense expressive aphasia and right hemiparesis. Confirmed on MRI. MRA demonstrated a left ICA occlusion with bilateral vertebral artery distal occlusion, and basilar artery proximal occlusion with distal retrograde recon from PCOM. Carotid Doppler demonstrates a left ICA/CCA occlusion. Echocardiogram demonstrated a 40% LVEF and no  Intracardiac source of embolus. He is COVID-19 negative. LDL is 90. HgbA1c is 5.6. He is receiving oral plavix and ASA. UDS was positive for THCU.  He has been evaluated by PT/OT who has recommended CIR for rehab. They have been consulted.  Extra and intracranial stenosis and occlusion: Consistent with large vessel disease. He is on Plavix.  Expressive aphasia/Dysphagia: SLP has been consulted and is working with the patient. He remains NPO. Cortrak has been ordered for provision of nutrition, but today SLP has advanced his diet to nectar thick liquids by spoon.[name]  Dietician has been consulted for tube feed recommendations to meet the patient's nutritional needs.  Metabolic encephalopathy: Resolved. Today the patient responded to my question if he wanted me to speak to anyone for him by choosing the word "sister" out of a list of relationship words. I will give her a call.  He also does appear to be able to follow commands and to understand speech.  AKI on CKD III: Slowly improving. Likely at baseline  creatinine.  CAD: Continue lipitor, plavix, ASAA, and crestor. Stable.  CHF: ACE I avoided due to CKDIII.  I have seen and examined this patient myself. I have spent 32 minutes in his evaluation and care.  DVT prophylaxis: SCDs Code Status:   Code Status: Full Code Family Communication: Sister. Disposition Plan: Discharge likely to CIR pending neuro workup  Chrystine Frogge, DO [facility] Direct contact: see [email or web address]  7PM-7AM contact night coverage as above 07/14/2019, 2:02 PM  LOS: 2 days

## 2019-07-15 NOTE — Progress Notes (Addendum)
Inpatient Rehabilitation-Admissions Coordinator   [facility] met with pt at the bedside with SLP present to help facilitate communication regarding rehab disposition. Pt indicated preference for rehab in [place], however, he indicates his choice is based on proximity to his wife. AC communicated that his wife prefers CIR. Due to need for coordination of care and communication issues, pt's wife will come in and speak with pt and AC later today for continued discussion. Cabin crew and bedside RN aware).   AC will follow up.   Zachary Mccann, OTR/L  Rehab Admissions Coordinator  [phone] 07/15/2019 12:47 PM  Addendum: 4:25PM: [name] is unable to get to the hospital for meeting today before [name] will now meet with fiance and pt tomorrow at [place]. (Floor RN and secretary notified of change).   Zachary Mccann, OTR/L  Rehab Admissions Coordinator  [phone] 07/15/2019 4:33 PM

## 2019-07-15 NOTE — Progress Notes (Signed)
PROGRESS NOTE  TOUA DROGE FYB:[id number] DOB: Jul 24, 1971 DOA: 07/10/2019 PCP: [name], NP  Brief History   Zachary Mccann is a 48 y.o. male with medical history significant forCVA, hypertension, CKD 3, coronary artery disease. Patient presented secondary to slurred speech, facial droop and lethargy and presents with symptoms likely related to acute stroke.  The patient has been re-evaluated by SLP and they want to advance his diet to nectar thick liquid via spoon and medications crushed with puree.  Consultants  . Inpatient rehab . Neurology  Procedures  . TTE  Subjective  The patient is resting comfortably.   Objective   Vitals:  Vitals:   07/15/19 1230 07/15/19 1600  BP: (!) 149/92 (!) 152/97  Pulse: 63 90  Resp: 20 16  Temp: 98 F (36.7 C) 99.4 F (37.4 C)  SpO2: 100% 100%    Exam:  Constitutional:  . The patient is awake and alert. He is non-verbal, but communicative. No acute distress. Respiratory:  . No increased work of breathing . No wheezes, rales, or rhonchi. . No tactile fremitus. Cardiovascular:  . Regular rate and rhythm . No murmurs, ectopy, or gallups. . No lateral PMI. No thrills. Abdomen:  . Abdomen is soft, non-tender, non-distended . No hernias, masses, or organomegaly . Normoactive bowel sounds. Musculoskeletal:  . No cyanosis, clubbing, or edema Skin:  . No rashes, lesions, ulcers . palpation of skin: no induration or nodules Neurologic:  . CN 2-12 intact . Left upper extremity MS is 1/5. LLE MS is 1/5. Psychiatric: Unable to evaluate due to the patient's inability to cooperate with exam.   I have personally reviewed the following:   Today's Data  . Vitals, BMP, lipids  Imaging  . MRI/MRA, or Carotid US  Cardiology Data  . Echocardiogram  Scheduled Meds: . atorvastatin  40 mg Oral q1800  . carvedilol  3.125 mg Oral BID WC  . chlorhexidine  15 mL Mouth Rinse BID  . clopidogrel  75 mg Oral Daily  . feeding  supplement (ENSURE ENLIVE)  237 mL Oral TID BM  . mouth rinse  15 mL Mouth Rinse q12n4p   Continuous Infusions: . dextrose 5 % and 0.9% NaCl 75 mL/hr at 07/15/19 5277    A & P   Principal Problem:   Slurred speech Active Problems:   Essential hypertension   CKD (chronic kidney disease), stage III (HCC) = Secondary to Hypertension   History of non-ST elevation myocardial infarction (NSTEMI)   Cardiomyopathy (HCC)   Cerebral embolism with cerebral infarction   Acute CVA (cerebrovascular accident) (HCC)   LOS: 4 days   CVA: Left MCA and ACA scattered infarct due to left ICA occlusion, large vessel disease. The patient has dense expressive aphasia and right hemiparesis. Confirmed on MRI. MRA demonstrated a left ICA occlusion with bilateral vertebral artery distal occlusion, and basilar artery proximal occlusion with distal retrograde recon from PCOM. Carotid Doppler demonstrates a left ICA/CCA occlusion. Echocardiogram demonstrated a 40% LVEF and no  Intracardiac source of embolus. He is COVID-19 negative. LDL is 90. HgbA1c is 5.6. He is receiving oral plavix and ASA. UDS was positive for THCU.  He has been evaluated by PT/OT who has recommended CIR for rehab. They have been consulted.  Extra and intracranial stenosis and occlusion: Consistent with large vessel disease. He is on Plavix.  Expressive aphasia/Dysphagia: SLP has been consulted and is working with the patient. He remains NPO. Cortrak has been ordered for provision of nutrition, but  today SLP has advanced his diet to nectar thick liquids by spoon.. Dietician has been consulted for tube feed recommendations to meet the patient's nutritional needs.  Metabolic encephalopathy: Resolved. Today the patient responded to my question if he wanted me to speak to anyone for him by choosing the word "sister" out of a list of relationship words. I will give her a call.  He also does appear to be able to follow commands and to understand speech.   AKI on CKD III: Slowly improving. Likely at baseline creatinine.  CAD: Continue lipitor, plavix, ASAA, and crestor. Stable.  CHF: ACE I avoided due to CKDIII.  I have seen and examined this patient myself. I have spent 45 minutes in his evaluation and care.  DVT prophylaxis: SCDs Code Status:   Code Status: Full Code Family Communication: I discussed the patient in detail with his brother Laveda Abbe this afternoon. I have attempted to reach the patient's sister Kenney Houseman at the number given to me by Laveda Abbe, but got no answer. Disposition Plan: Discharge likely to CIR pending neuro workup  Zoella Roberti, DO [facility] Direct contact: see [email or web address]  7PM-7AM contact night coverage as above 07/15/2019, 5:26 PM  LOS: 2 days

## 2019-07-15 NOTE — Progress Notes (Signed)
Physical Therapy Treatment Patient Details Name: Zachary Mccann MRN: [id number] DOB: June 05, 1971 Today's Date: 07/15/2019    History of Present Illness Patient is a 48 y/o male presenting to the ED on 07/10/2019 with primary complaints of slurred speech, R facial droop. CT head-unremarkable. MRI pending. Past medical history significant for CVA, hypertension, CKD 3, coronary artery disease.     PT Comments    Patient seen for mobility progression. This session focused on functional transfer training utilizing Stedy standing frame. Continue to recommend CIR for further skilled PT services.    Follow Up Recommendations  CIR     Equipment Recommendations  Other (comment)(TBD)    Recommendations for Other Services       Precautions / Restrictions Precautions Precautions: Fall Restrictions Weight Bearing Restrictions: No    Mobility  Bed Mobility Overal bed mobility: Needs Assistance Bed Mobility: Supine to Sit     Supine to sit: Mod assist;+2 for physical assistance     General bed mobility comments: assist to bring R LE/hips to EOB and to elevate trunk into sitting; cues for sequencing   Transfers Overall transfer level: Needs assistance   Transfers: Sit to/from Stand Sit to Stand: Mod assist;+2 physical assistance;Min assist;+2 safety/equipment         General transfer comment: pt stood several times during session utilizing Stedy standing frame; less assist required to power up from higher surface; assist to support R UE   Ambulation/Gait                 Stairs             Wheelchair Mobility    Modified Rankin (Stroke Patients Only) Modified Rankin (Stroke Patients Only) Pre-Morbid Rankin Score: No significant disability Modified Rankin: Severe disability     Balance Overall balance assessment: Needs assistance Sitting-balance support: Single extremity supported;Feet supported Sitting balance-Leahy Scale: Fair     Standing balance  support: Bilateral upper extremity supported;During functional activity Standing balance-Leahy Scale: Poor                              Cognition Arousal/Alertness: Awake/alert Behavior During Therapy: Flat affect Overall Cognitive Status: Impaired/Different from baseline Area of Impairment: Following commands;Safety/judgement;Problem solving                   Current Attention Level: Sustained   Following Commands: Follows one step commands inconsistently;Follows one step commands with increased time Safety/Judgement: Decreased awareness of safety;Decreased awareness of deficits   Problem Solving: Difficulty sequencing;Requires tactile cues;Requires verbal cues;Slow processing General Comments: increaed time for processing; tends to use "thumbs up/down"      Exercises      General Comments        Pertinent Vitals/Pain Pain Assessment: No/denies pain    Home Living                      Prior Function            PT Goals (current goals can now be found in the care plan section) Acute Rehab PT Goals Patient Stated Goal: did not state  Progress towards PT goals: Progressing toward goals    Frequency    Min 4X/week      PT Plan Current plan remains appropriate    Co-evaluation              AM-PAC PT "6 Clicks" Mobility   Outcome Measure  Help needed turning from your back to your side while in a flat bed without using bedrails?: A Lot Help needed moving from lying on your back to sitting on the side of a flat bed without using bedrails?: A Lot Help needed moving to and from a bed to a chair (including a wheelchair)?: A Lot Help needed standing up from a chair using your arms (e.g., wheelchair or bedside chair)?: A Lot Help needed to walk in hospital room?: Total Help needed climbing 3-5 steps with a railing? : Total 6 Click Score: 10    End of Session Equipment Utilized During Treatment: Gait belt Activity Tolerance:  Patient tolerated treatment well Patient left: in chair;with call bell/phone within reach;with chair alarm set Nurse Communication: Mobility status PT Visit Diagnosis: Unsteadiness on feet (R26.81);Other abnormalities of gait and mobility (R26.89);Muscle weakness (generalized) (M62.81)     Time: 7116-5790 PT Time Calculation (min) (ACUTE ONLY): 22 min  Charges:  $Gait Training: 8-22 mins                     Erline Levine, PTA Acute Rehabilitation Services Pager: [phone] Office: [phone]     Carolynne Edouard 07/15/2019, 4:30 PM

## 2019-07-15 NOTE — Progress Notes (Signed)
  Speech Language Pathology Treatment: Cognitive-Linquistic  Patient Details Name: Zachary Mccann MRN: [id number] DOB: 1971-01-02 Today's Date: 07/15/2019 Time: 0272-5366 SLP Time Calculation (min) (ACUTE ONLY): 53 min  Assessment / Plan / Recommendation Clinical Impression  Pt was seen for treatment and was cooperative throughout the session with intermittent smiling in response to some of the SLP's comments or jokes. Spontaneous vocalization was noted during the session but pt was unable to imitate vocalization or demonstrate humming during the session despite max support. Pt was able imitate articulatory placement of bilabial phonemes with 25% accuracy but was unable to imitate production of bilabial plosive phonemes despite cues. He demonstrated 100% accuracy with response to complex yes/no questions and yes/no questions related to simple and complex paragraphs. It is also noteworthy that pt was able to respond to open-ended questions related to complex paragraphs with 100% accuracy when he used writing to respond to questions. SLP discussed rehab's recommendation for CIR with pt and he was educated regarding the benefits of intensive rehab. He indicated comprehension; however, he communicated (via writing) that he would like to go to rehab in [place]. SLP also facilitated an additional discussion with the pt and CIR's admission's coordinator, Zachary Mccann. Pt was able to communicate his desires via writing and it was ultimately agreed that Zachary Mccann would check with charge nurse to see if the pt's fiancee can come in to discuss this matter further. Please see her note for further detail. SLP will continue to follow pt.    HPI HPI: Patient is a 48 y/o male presenting to the ED on 07/10/2019 with primary complaints of slurred speech, R facial droop. MRI extensive left hemisphere acute/subacute white matter infarcts Extensive left hemisphere acute/subacute white matter infarcts involving the left centrum semi  ovale and corona radiata. Scattered punctate foci of cortical infarct involving the anterior left frontal lobe and left parietal lobe. Occluded left internal carotid artery with reconstitution of the level of the left posterior communicating artery.  Past medical history significant for CVA, hypertension, CKD 3, coronary artery disease.       SLP Plan  Continue with current plan of care;MBS       Recommendations  Diet recommendations: Dysphagia 1 (puree);Nectar-thick liquid Liquids provided via: Teaspoon;Cup Medication Administration: Crushed with puree Supervision: Staff to assist with self feeding;Full supervision/cueing for compensatory strategies Compensations: Minimize environmental distractions;Small sips/bites;Monitor for anterior loss(slight head tilt to facilitate transfer into throat) Postural Changes and/or Swallow Maneuvers: Seated upright 90 degrees                Oral Care Recommendations: Oral care BID Follow up Recommendations: Inpatient Rehab SLP Visit Diagnosis: Apraxia (R48.2) Plan: Continue with current plan of care;MBS       Briunna Leicht I. Hardin Negus, [place], [place] Office number [phone] Pager [place] 07/15/2019, 12:37 PM

## 2019-07-15 NOTE — Progress Notes (Addendum)
  Speech Language Pathology Treatment: Dysphagia  Patient Details Name: Zachary Mccann MRN: [id number] DOB: [birth year] Today's Date: 07/15/2019 Time: 1030-1100 SLP Time Calculation (min) (ACUTE ONLY): 30 min  Assessment / Plan / Recommendation Clinical Impression  F/u after yesteday's MBS.  Pt amenable to drinking several sips of nectar-thick liquid.  Attempts to drink from edge of cup led to copious spillage from right side of mouth.  This was better controlled when nectar liquids were delivered by spoon to left side of mouth, mid tongue.  Pt required verbal/tactile cues for slight head extension to facilitate transfer into pharynx.  He had difficulty carrying-over task instructions. When limiting quantity to teaspoon, there was no coughing post-swallow; larger boluses from cup edge led to increased coughing, concerning for aspiration based on yesterday's MBS results. Pt declined further POs.    Given extent of dysphagia and the intensive help required for pt to eat safely, there are concerns that he will be able to meet his nutritional needs by PO alone.  SLP will follow closely for safety, therapeutic instruction.  Discussed swallowing/communication by phone with his fiance, Zachary Mccann, who verbalizes understanding.      HPI HPI: Patient is a 48 y/o male presenting to the ED on 07/10/2019 with primary complaints of slurred speech, R facial droop. MRI extensive left hemisphere acute/subacute white matter infarcts Extensive left hemisphere acute/subacute white matter infarcts involving the left centrum semi ovale and corona radiata. Scattered punctate foci of cortical infarct involving the anterior left frontal lobe and left parietal lobe. Occluded left internal carotid artery with reconstitution of the level of the left posterior communicating artery.  Past medical history significant for CVA, hypertension, CKD 3, coronary artery disease.       SLP Plan  Continue with current plan of care        Recommendations  Diet recommendations: Dysphagia 1 (puree);Nectar-thick liquid Liquids provided via: Teaspoon;Cup Medication Administration: Crushed with puree Supervision: Staff to assist with self feeding;Full supervision/cueing for compensatory strategies Compensations: Minimize environmental distractions;Small sips/bites;Monitor for anterior loss(slight head tilt to facilitate transfer into throat) Postural Changes and/or Swallow Maneuvers: Seated upright 90 degrees                Oral Care Recommendations: Oral care BID Follow up Recommendations: Inpatient Rehab SLP Visit Diagnosis: Dysphagia, oropharyngeal phase (R13.12) Plan: Continue with current plan of care       GO                [name] 07/15/2019, 11:27 AM  Estill Bamberg L. Tivis Ringer, [place] Office number [phone] Pager [phone]

## 2019-07-16 DIAGNOSIS — I10 Essential (primary) hypertension: Secondary | ICD-10-CM

## 2019-07-16 LAB — BASIC METABOLIC PANEL
Anion gap: 9 (ref 5–15)
BUN: 14 mg/dL (ref 6–20)
CO2: 22 mmol/L (ref 22–32)
Calcium: 9.3 mg/dL (ref 8.9–10.3)
Chloride: 111 mmol/L (ref 98–111)
Creatinine, Ser: 1.63 mg/dL — ABNORMAL HIGH (ref 0.61–1.24)
GFR calc Af Amer: 57 mL/min — ABNORMAL LOW (ref >60)
GFR calc non Af Amer: 49 mL/min — ABNORMAL LOW (ref >60)
Glucose, Bld: 102 mg/dL — ABNORMAL HIGH (ref 70–99)
Potassium: 4 mmol/L (ref 3.5–5.1)
Sodium: 142 mmol/L (ref 135–145)

## 2019-07-16 LAB — CBC WITH DIFFERENTIAL/PLATELET
Abs Immature Granulocytes: 0.03 10*3/uL (ref 0.00–0.07)
Basophils Absolute: 0 10*3/uL (ref 0.0–0.1)
Basophils Relative: 0 %
Eosinophils Absolute: 0.3 10*3/uL (ref 0.0–0.5)
Eosinophils Relative: 3 %
HCT: 39.9 % (ref 39.0–52.0)
Hemoglobin: 13.2 g/dL (ref 13.0–17.0)
Immature Granulocytes: 0 %
Lymphocytes Relative: 18 %
Lymphs Abs: 1.6 10*3/uL (ref 0.7–4.0)
MCH: 31.8 pg (ref 26.0–34.0)
MCHC: 33.1 g/dL (ref 30.0–36.0)
MCV: 96.1 fL (ref 80.0–100.0)
Monocytes Absolute: 0.9 10*3/uL (ref 0.1–1.0)
Monocytes Relative: 10 %
Neutro Abs: 6.2 10*3/uL (ref 1.7–7.7)
Neutrophils Relative %: 69 %
Platelets: 225 10*3/uL (ref 150–400)
RBC: 4.15 MIL/uL — ABNORMAL LOW (ref 4.22–5.81)
RDW: 11.6 % (ref 11.5–15.5)
WBC: 9.1 10*3/uL (ref 4.0–10.5)
nRBC: 0 % (ref 0.0–0.2)

## 2019-07-16 MED ORDER — WHITE PETROLATUM EX OINT
TOPICAL_OINTMENT | CUTANEOUS | Status: AC
  Administered 2019-07-16: 03:00:00
  Filled 2019-07-16: qty 28.35

## 2019-07-16 NOTE — Progress Notes (Signed)
PROGRESS NOTE  LITTLETON HAUB QQV:[id number] DOB: [birth year] DOA: 07/10/2019 PCP: Ailene Ards, NP  Brief History   Zachary Mccann is a 48 y.o. male with medical history significant forCVA, hypertension, CKD 3, coronary artery disease. Patient presented secondary to slurred speech, facial droop and lethargy and presents with symptoms likely related to acute stroke.  Consultants  . Inpatient rehab . Neurology  Procedures  . TTE  Subjective  The patient is resting comfortably.   Objective   Vitals:  Vitals:   07/16/19 1044 07/16/19 1100  BP:  (!) 141/121  Pulse: 92 82  Resp:  16  Temp:  98.6 F (37 C)  SpO2:  99%    Exam:  Constitutional:  . The patient is awake and alert. He is non-verbal, but communicative. No acute distress. Respiratory:  . No increased work of breathing . No wheezes, rales, or rhonchi. . No tactile fremitus. Cardiovascular:  . Regular rate and rhythm . No murmurs, ectopy, or gallups. . No lateral PMI. No thrills. Abdomen:  . Abdomen is soft, non-tender, non-distended . No hernias, masses, or organomegaly . Normoactive bowel sounds. Musculoskeletal:  . No cyanosis, clubbing, or edema Skin:  . No rashes, lesions, ulcers . palpation of skin: no induration or nodules Neurologic:  . CN 2-12 intact . Left upper extremity MS is 1/5. LLE MS is 1/5.  I have personally reviewed the following:   Today's Data  . Vitals, BMP, lipids  Imaging  . MRI/MRA, or Carotid US  Cardiology Data  . Echocardiogram  Scheduled Meds: . atorvastatin  40 mg Oral q1800  . carvedilol  3.125 mg Oral BID WC  . chlorhexidine  15 mL Mouth Rinse BID  . clopidogrel  75 mg Oral Daily  . feeding supplement (ENSURE ENLIVE)  237 mL Oral TID BM  . mouth rinse  15 mL Mouth Rinse q12n4p   Continuous Infusions: . dextrose 5 % and 0.9% NaCl 75 mL/hr at 07/15/19 3329    A & P   Principal Problem:   Slurred speech Active Problems:   Essential hypertension    CKD (chronic kidney disease), stage III (HCC) = Secondary to Hypertension   History of non-ST elevation myocardial infarction (NSTEMI)   Cardiomyopathy ([place])   Cerebral embolism with cerebral infarction   Acute CVA (cerebrovascular accident) ([place])   LOS: 5 days   Acute CVA: Left MCA and ACA scattered infarct due to left ICA occlusion, large vessel disease. POA Concurrent expressive aphasia and right hemiparesis.  Confirmed on MRI.  MRA demonstrated a left ICA occlusion with bilateral vertebral artery distal occlusion, and basilar artery proximal occlusion with distal retrograde recon from PCOM.  Carotid Doppler demonstrates a left ICA/CCA occlusion.  Echocardiogram demonstrated a 40% LVEF and no intracardiac source of embolus.  LDL is 90.  HgbA1c is 5.6.  Continue plavix and ASA.  UDS was positive for THCU.   He has been evaluated by PT/OT who has recommended CIR for rehab.  Extra and intracranial stenosis and occlusion:  Consistent with large vessel disease. He is on Plavix/ASA.  Expressive aphasia/Dysphagia 2/2 above:  SLP has been consulted and is working with the patient.  SLP has advanced his diet to nectar thick liquids by spoon. Dietician has been consulted for nutritional needs.  Metabolic encephalopathy, POA, likely secondary to above: Resolved.  Patient remains poorly verbal but able to respond appropriately with hand signals and communicate via written form with speech  AKI on CKD III:  Slowly improving.  Likely at baseline creatinine.  CAD:  Continue lipitor, plavix, ASAA, and crestor. Stable.  CHF, EF 40%, diastolic:  ACE I avoided due to CKDIII.  DVT prophylaxis: SCDs Code Status:   Code Status: Full Code Disposition Plan: Discharge likely to CIR pending bed availability  Carma Leaven DO [facility] Direct contact: see [email or web address]  7PM-7AM contact night coverage as above

## 2019-07-16 NOTE — PMR Pre-admission (Signed)
PMR Admission Coordinator Pre-Admission Assessment  Patient: Zachary Mccann is an 48 y.o., male MRN: [id number] DOB: Sep 10, 1971 Height: 6' (182.9 cm) Weight: (!) 147 kg  Insurance Information HMO:     PPO:      PCP:      IPA:      80/20:      OTHER:  PRIMARY: Uninsured (self pay)      Policy#:       Subscriber:  CM Name:       Phone#:      Fax#:  Pre-Cert#:       Employer:  Benefits:  Phone #:      Name:  Eff. Date:      Deduct:       Out of Pocket Max:       Life Max:  CIR: Pt is aware of estimated daily cost of care ($3,500)      SNF:  Outpatient:      Co-Pay:  Home Health:       Co-Pay:  DME:      Co-Pay:  Providers:  *pt's assigned financial counselor is Toniann Ket [phone]) she is following the case. She will follow up Wednesday 8/5 for pt's signature and plans to submit MAD application at that time.   SECONDARY:       Policy#:       Subscriber:  CM Name:       Phone#:      Fax#:  Pre-Cert#:       Employer: Benefits:  Phone #:      Name:  Eff. Date:      Deduct:       Out of Pocket Max:       Life Max:  CIR:      SNF:  Outpatient:      Co-Pay:  Home Health:       Co-Pay:  DME:      Co-Pay:   Medicaid Application Date:       Case Manager:  Disability Application Date:       Case Worker:   The "Data Collection Information Summary" for patients in Inpatient Rehabilitation Facilities with attached "Privacy Act [place] Records" was provided and verbally reviewed with: N/A  Emergency Contact Information Contact Information    Name Relation Home Work Mobile   [place] Niece   [phone]   jessy, cybulski Brother [phone]  [phone]   Nori Riis Significant other   [phone]      Current Medical History  Patient Admitting Diagnosis: Left MCA and ACA scattered infarct  History of Present Illness: Zachary Mccann is a 48 year old male with history of left cerebellar CVA September 2019, CAD non-STEMI status post stenting  May 2019 followed by Dr. Carlyle Dolly maintained on Effient in the past, hypertension, CKD stage III with creatinine 1.82-2.38 tobacco abuse as well as medical noncompliance.Pt presented 07/10/2019 with aphasia, right facial droop and right side weakness.  Urine drug screen positive marijuana, COVID negative, creatinine 2.38.  Cranial CT scan showed areas of prior infarction in the superior aspect of the left cerebellum.  No evidence of acute hemorrhage noted.  Patient did not receive TPA.  MRI/MRA showed occluded left internal carotid artery with reconstitution of the level of the left posterior communicating artery.  Scattered punctate foci of cortical infarct involving the anterior left frontal lobe and left parietal lobe.  Extensive left hemisphere acute subacute white matter infarcts involving the left centrum semiovale and corona radiata as well as  border zone areas.  Echocardiogram with ejection fraction of 40%.  Neurology consulted presently on Plavix for CVA prophylaxis.  Patient does have an allergy to aspirin.  Full liquid diet with nectar thick consistency.  Therapy evaluations completed and patient is to be admitted for a comprehensive rehab program on 07/17/2019.  Complete NIHSS TOTAL: 12  Patient's medical record from [facility] has been reviewed by the rehabilitation admission coordinator and physician.  Past Medical History  Past Medical History:  Diagnosis Date  . CAD (coronary artery disease)    a. s/p NSTEMI in 04/2018 with DES to LCx.   [name] Hypertension   . Myocardial infarction ([place])   . Pneumonia   . Stroke [facility])     Family History   family history includes Alcoholism in his father; Cancer in his father and sister; Dementia in his mother; Hypertension in his father and mother; Stroke in his father and mother.  Prior Rehab/Hospitalizations Has the patient had prior rehab or hospitalizations prior to admission? No  Has the patient had major surgery during  100 days prior to admission? No   Current Medications  Current Facility-Administered Medications:  .  acetaminophen (TYLENOL) tablet 650 mg, 650 mg, Oral, Q4H PRN **OR** acetaminophen (TYLENOL) solution 650 mg, 650 mg, Per Tube, Q4H PRN **OR** acetaminophen (TYLENOL) suppository 650 mg, 650 mg, Rectal, Q4H PRN, Emokpae, Ejiroghene E, MD .  atorvastatin (LIPITOR) tablet 40 mg, 40 mg, Oral, q1800, Emokpae, Ejiroghene E, MD, 40 mg at 07/16/19 1738 .  carvedilol (COREG) tablet 3.125 mg, 3.125 mg, Oral, BID WC, Swayze, Ava, DO, 3.125 mg at 07/17/19 0848 .  chlorhexidine (PERIDEX) 0.12 % solution 15 mL, 15 mL, Mouth Rinse, BID, Mariel Aloe, MD, 15 mL at 07/17/19 0848 .  clopidogrel (PLAVIX) tablet 75 mg, 75 mg, Oral, Daily, Emokpae, Ejiroghene E, MD, 75 mg at 07/17/19 0848 .  dextrose 5 %-0.9 % sodium chloride infusion, , Intravenous, Continuous, Rosalin Hawking, MD, Last Rate: 75 mL/hr at 07/16/19 1840 .  feeding supplement (ENSURE ENLIVE) (ENSURE ENLIVE) liquid 237 mL, 237 mL, Oral, TID BM, Swayze, Ava, DO, 237 mL at 07/17/19 0848 .  MEDLINE mouth rinse, 15 mL, Mouth Rinse, q12n4p, Mariel Aloe, MD, 15 mL at 07/16/19 1739 .  senna-docusate (Senokot-S) tablet 1 tablet, 1 tablet, Oral, QHS PRN, Emokpae, Ejiroghene E, MD  Patients Current Diet:  Diet Order            Diet full liquid Room service appropriate? Yes; Fluid consistency: Nectar Thick  Diet effective now              Precautions / Restrictions Precautions Precautions: Fall Restrictions Weight Bearing Restrictions: No   Has the patient had 2 or more falls or a fall with injury in the past year? Yes  Prior Activity Level Community (5-7x/wk): very active PTA; did not work or drive due to vision issues; was Independent PTA with use of SPC.   Prior Functional Level Self Care: Did the patient need help bathing, dressing, using the toilet or eating? Independent  Indoor Mobility: Did the patient need assistance with walking from  room to room (with or without device)? Independent  Stairs: Did the patient need assistance with internal or external stairs (with or without device)? Independent  Functional Cognition: Did the patient need help planning regular tasks such as shopping or remembering to take medications? [place] / [place] Devices/Equipment: Gilford Rile (specify type) Home Equipment: Walker - 2 wheels,  Shower seat, Bedside commode  Prior Device Use: Indicate devices/aids used by the patient prior to current illness, exacerbation or injury? single point cane (also has RW)  Current Functional Level Cognition  Arousal/Alertness: Awake/alert Overall Cognitive Status: Impaired/Different from baseline Difficult to assess due to: Impaired communication Current Attention Level: Sustained Orientation Level: Oriented X4 Following Commands: Follows one step commands inconsistently, Follows one step commands with increased time Safety/Judgement: Decreased awareness of safety, Decreased awareness of deficits General Comments: increaed time for processing; tends to use "thumbs up/down" Attention: Sustained Sustained Attention: Impaired Sustained Attention Impairment: Functional basic Memory: (continue to address ) Awareness: Impaired Awareness Impairment: Emergent impairment Problem Solving: (continue to assess in diagnostic tx) Safety/Judgment: Impaired    Extremity Assessment (includes Sensation/Coordination)  Upper Extremity Assessment: Generalized weakness RUE Deficits / Details: initiation of movement- seen proximal movement; swelling; not functional  RUE Sensation: (most lkely impaired) RUE Coordination: decreased fine motor, decreased gross motor  Lower Extremity Assessment: Defer to PT evaluation RLE Deficits / Details: difficulty lifting LE against gravity - reduced weight shift to this LE    ADLs  Overall ADL's : Needs assistance/impaired Eating/Feeding: Set  up, Minimal assistance, Sitting, Cueing for safety, Cueing for sequencing Grooming: Minimal assistance, Sitting Grooming Details (indicate cue type and reason): uses LUE Upper Body Bathing: Moderate assistance, Bed level Lower Body Bathing: Maximal assistance, Sit to/from stand Lower Body Bathing Details (indicate cue type and reason): Able to stand while theraist assisted with pericare Upper Body Dressing : Maximal assistance, Bed level Lower Body Dressing: Total assistance, Bed level Toilet Transfer: Total assistance Toilet Transfer Details (indicate cue type and reason): unable due to fatigue  Toileting- Clothing Manipulation and Hygiene: Total assistance, Bed level Functional mobility during ADLs: Moderate assistance, +2 for physical assistance, Cueing for safety, Cueing for sequencing General ADL Comments: drinking from cup, using suction and writing on board with minguardA to set-upA    Mobility  Overal bed mobility: Needs Assistance Bed Mobility: Supine to Sit Rolling: Min assist Supine to sit: Mod assist, +2 for physical assistance General bed mobility comments: assist to bring R LE/hips to EOB and to elevate trunk into sitting; cues for sequencing     Transfers  Overall transfer level: Needs assistance Equipment used: 2 person hand held assist Transfer via Lift Equipment: Stedy Transfers: Sit to/from Stand Sit to Stand: Mod assist, +2 physical assistance, Min assist, +2 safety/equipment Stand pivot transfers: Max assist, +2 physical assistance  Lateral/Scoot Transfers: Mod assist, Max assist, +2 physical assistance(block RLE due to extensor pattern) General transfer comment: pt stood X 3 trials with use of standing frame with R knee blocked for safety and R UE supported; use of gait belt to assist with power up into standing; Stedy standing frame used for transfer bed to recliner for safety    Ambulation / Gait / Stairs / Wheelchair Mobility  Ambulation/Gait General Gait  Details: worked on pre gait activiites standing at [employer] with +2 assist; pt able to step forward/backward with L LE with R LE blocked for safety    Posture / Balance Balance Overall balance assessment: Needs assistance Sitting-balance support: Single extremity supported, Feet supported Sitting balance-Leahy Scale: Fair Standing balance support: Bilateral upper extremity supported, During functional activity Standing balance-Leahy Scale: Poor Standing balance comment: up to Max A for standing balance    Special needs/care consideration BiPAP/CPAP : no CPM : no Continuous Drip IV : dextrose 5%-.9% sodium chloride infusion  Dialysis : no  Days : no Life Vest : no Oxygen : no on RA Special Bed : no Trach Size : no Wound Vac (area) : no      Location : no Skin : no areas of concern                     Bowel mgmt: incontinent, last BM: 7/28 Bladder mgmt: incontinent, external catheter in place Diabetic mgmt: no Behavioral consideration : communicates best through writing due to aphasia  Chemo/radiation : no   Previous Home Environment (from acute therapy documentation) Living Arrangements: Spouse/significant other Available Help at Discharge: Available 24 hours/day Type of Home: House Home Layout: Two level, Able to live on main level with bedroom/bathroom Home Access: Stairs to enter Entrance Stairs-Rails: Right Entrance Stairs-Number of Steps: 2 Bathroom Shower/Tub: Multimedia programmer: [place]: No  Discharge Living Setting Plans for Discharge Living Setting: Patient's home, Lives with (comment)(fiance [name]) Type of Home at Discharge: House Discharge Home Layout: Two level Alternate Level Stairs-Rails: None Alternate Level Stairs-Number of Steps: NA pt does not need to go upstairs; can live on first floor Discharge Home Access: Stairs to enter Entrance Stairs-Rails: None Entrance Stairs-Number of Steps: 2-3 steps plus threshold;  fiance aware of need for ramp  Discharge Bathroom Shower/Tub: Tub/shower unit Discharge Bathroom Toilet: Standard Discharge Bathroom Accessibility: Yes How Accessible: Accessible via walker Does the patient have any problems obtaining your medications?: Yes (Describe)(pt uninsured)  Social/Family/Support Systems Patient Roles: Partner, Caregiver(assists with [name]'s 6 kids (4/6 with disabilities)) Contact Information: fiance [name]: [phone]; Niece Delana Meyer: [phone]; Trish Fountain [phone]) Anticipated Caregiver: [name] Anticipated Caregiver's Contact Information: see above Ability/Limitations of Caregiver: Mod A Caregiver Availability: 24/7 Discharge Plan Discussed with Primary Caregiver: Yes Is Caregiver In Agreement with Plan?: Yes Does Caregiver/Family have Issues with Lodging/Transportation while Pt is in Rehab?: Yes  Goals/Additional Needs Patient/Family Goal for Rehab: PT/OT: Mod A; SLP: Min A Expected length of stay: 16-20 days Cultural Considerations: NA Dietary Needs: full liquid diet, nectar thick Equipment Needs: TBD Pt/Family Agrees to Admission and willing to participate: Yes Program Orientation Provided & Reviewed with Pt/Caregiver Including Roles  & Responsibilities: Yes(reviewed with [name], sister Margorie John)  Barriers to Discharge: Home environment access/layout, Incontinence, Lack of/limited family support, Insurance for SNF coverage, Nutrition means  Barriers to Discharge Comments: steps to enter; assist level available; on tickened liquids currently (may be difficult to afford)  Decrease burden of Care through IP rehab admission: NA  Possible need for SNF placement upon discharge: Not anticipated; pt is uninsured and the plan (as understood by pt, fiance, and family at the time of admit to [facility]) is for pt to return home at end of CIR stay. Fiance is able to care for pt with 24/7 A if pt able to transfer with one person. She is willing to be  trained and come in for education as needed.   Patient Condition: I have reviewed medical records from [facility], spoken with CM, and patient, his fiance and his niece and sister. I met with patient at the bedside for inpatient rehabilitation assessment.  Patient will benefit from ongoing PT, OT and SLP, can actively participate in 3 hours of therapy a day 5 days of the week, and can make measurable gains during the admission.  Patient will also benefit from the coordinated team approach during an Inpatient Acute Rehabilitation admission.  The patient will receive intensive therapy as well as Rehabilitation physician, nursing,  Education officer, museum, and care management interventions.  Due to bladder management, bowel management, safety, skin/wound care, disease management, medication administration, pain management and patient education the patient requires 24 hour a day rehabilitation nursing.  The patient is currently Min/Mod A x2 for sit to stand transfers and Max A x2 for stand pivot transfers and Min A for feeding and grooming basic ADLs.  Discharge setting and therapy post discharge at home with home health is anticipated.  Patient has agreed to participate in the Acute Inpatient Rehabilitation Program and will admit today.  Preadmission Screen Completed By:  Jhonnie Garner, 07/17/2019 10:33 AM ______________________________________________________________________   Discussed status with Dr. Naaman Plummer on 07/17/2019 at 10:33AM and received approval for admission today.  Admission Coordinator:  Jhonnie Garner, OT, time 10:33AM/Date 07/17/2019   Assessment/Plan: Diagnosis: left MCA and ACA infarct 1. Does the need for close, 24 hr/day Medical supervision in concert with the patient's rehab needs make it unreasonable for this patient to be served in a less intensive setting? Yes 2. Co-Morbidities requiring supervision/potential complications: CAD, CKD 3. Due to bladder management, bowel management,  safety, skin/wound care, disease management, medication administration, pain management and patient education, does the patient require 24 hr/day rehab nursing? Yes 4. Does the patient require coordinated care of a physician, rehab nurse, PT (1-2 hrs/day, 5 days/week), OT (1-2 hrs/day, 5 days/week) and SLP (1-2 hrs/day, 5 days/week) to address physical and functional deficits in the context of the above medical diagnosis(es)? Yes Addressing deficits in the following areas: balance, endurance, locomotion, strength, transferring, bowel/bladder control, bathing, dressing, feeding, grooming, toileting, cognition, speech and psychosocial support 5. Can the patient actively participate in an intensive therapy program of at least 3 hrs of therapy 5 days a week? Yes 6. The potential for patient to make measurable gains while on inpatient rehab is excellent 7. Anticipated functional outcomes upon discharge from inpatients are: mod assist PT, mod assist OT, min assist SLP 8. Estimated rehab length of stay to reach the above functional goals is: 16-20 days 9. Anticipated D/C setting: Home 10. Anticipated post D/C treatments: [place] therapy 11. Overall Rehab/Functional Prognosis: excellent  MD Signature: Meredith Staggers, MD, [place] [facility] 07/17/2019

## 2019-07-16 NOTE — Progress Notes (Addendum)
Inpatient Rehabilitation-Admissions Coordinator   [facility] met with pt and his fiance [name] at the bedside. After lengthy discussion, pt is on board with pursing CIR. [name] confirmed caregiver support and intent for pt to return home with her at the end of the program. Consent forms signed and financial letter explained.   At this time I do not have a CIR bed for this patient today. Will follow up for bed availability daily.   Jhonnie Garner, OTR/L  Rehab Admissions Coordinator  [phone] 07/16/2019 1:26 PM

## 2019-07-16 NOTE — Progress Notes (Signed)
Physical Therapy Treatment Patient Details Name: Zachary Mccann MRN: [id number] DOB: 1971/06/10 Today's Date: 07/16/2019    History of Present Illness Patient is a 48 y/o male presenting to the ED on 07/10/2019 with primary complaints of slurred speech, R facial droop. CT head-unremarkable. MRI pending. Past medical history significant for CVA, hypertension, CKD 3, coronary artery disease.     PT Comments    Patient seen for mobility progression. This session focused on functional transfer training and pre gait activities. Pt requires mod A +2 for sit to stands without use of standing frame. Current plan remains appropriate.    Follow Up Recommendations  CIR     Equipment Recommendations  Other (comment)(TBD)    Recommendations for Other Services       Precautions / Restrictions Precautions Precautions: Fall Restrictions Weight Bearing Restrictions: No    Mobility  Bed Mobility Overal bed mobility: Needs Assistance Bed Mobility: Supine to Sit Rolling: Min assist   Supine to sit: Mod assist;+2 for physical assistance     General bed mobility comments: assist to bring R LE/hips to EOB and to elevate trunk into sitting; cues for sequencing   Transfers Overall transfer level: Needs assistance Equipment used: 2 person hand held assist Transfers: Sit to/from Stand Sit to Stand: Mod assist;+2 physical assistance;Min assist;+2 safety/equipment Stand pivot transfers: Max assist;+2 physical assistance       General transfer comment: pt stood X 3 trials with use of standing frame with R knee blocked for safety and R UE supported; use of gait belt to assist with power up into standing; Stedy standing frame used for transfer bed to recliner for safety  Ambulation/Gait             General Gait Details: worked on pre gait activiites standing at [employer] with +2 assist; pt able to step forward/backward with L LE with R LE blocked for safety   Stairs              Wheelchair Mobility    Modified Rankin (Stroke Patients Only) Modified Rankin (Stroke Patients Only) Pre-Morbid Rankin Score: No significant disability Modified Rankin: Severe disability     Balance Overall balance assessment: Needs assistance Sitting-balance support: Single extremity supported;Feet supported Sitting balance-Leahy Scale: Fair     Standing balance support: Bilateral upper extremity supported;During functional activity Standing balance-Leahy Scale: Poor Standing balance comment: up to Max A for standing balance                            Cognition Arousal/Alertness: Awake/alert Behavior During Therapy: Flat affect Overall Cognitive Status: Impaired/Different from baseline Area of Impairment: Following commands;Safety/judgement;Problem solving                   Current Attention Level: Sustained   Following Commands: Follows one step commands inconsistently;Follows one step commands with increased time Safety/Judgement: Decreased awareness of safety;Decreased awareness of deficits   Problem Solving: Difficulty sequencing;Requires tactile cues;Requires verbal cues;Slow processing General Comments: increaed time for processing; tends to use "thumbs up/down"      Exercises      General Comments        Pertinent Vitals/Pain Pain Assessment: No/denies pain    Home Living                      Prior Function            PT Goals (current goals can now be  found in the care plan section) Acute Rehab PT Goals Patient Stated Goal: did not state  Progress towards PT goals: Progressing toward goals    Frequency    Min 4X/week      PT Plan Current plan remains appropriate    Co-evaluation PT/OT/SLP Co-Evaluation/Treatment: Yes Reason for Co-Treatment: For patient/therapist safety;To address functional/ADL transfers;Complexity of the patient's impairments (multi-system involvement) PT goals addressed during session:  Mobility/safety with mobility;Balance OT goals addressed during session: ADL's and self-care      AM-PAC PT "6 Clicks" Mobility   Outcome Measure  Help needed turning from your back to your side while in a flat bed without using bedrails?: A Lot Help needed moving from lying on your back to sitting on the side of a flat bed without using bedrails?: A Lot Help needed moving to and from a bed to a chair (including a wheelchair)?: A Lot Help needed standing up from a chair using your arms (e.g., wheelchair or bedside chair)?: A Lot Help needed to walk in hospital room?: Total Help needed climbing 3-5 steps with a railing? : Total 6 Click Score: 10    End of Session Equipment Utilized During Treatment: Gait belt Activity Tolerance: Patient tolerated treatment well Patient left: in chair;with call bell/phone within reach;with chair alarm set Nurse Communication: Mobility status PT Visit Diagnosis: Unsteadiness on feet (R26.81);Other abnormalities of gait and mobility (R26.89);Muscle weakness (generalized) (M62.81)     Time: 8469-6295 PT Time Calculation (min) (ACUTE ONLY): 40 min  Charges:  $Gait Training: 23-37 mins                     Zachary Mccann, PTA Acute Rehabilitation Services Pager: [phone] Office: [phone]     Zachary Mccann 07/16/2019, 3:01 PM

## 2019-07-16 NOTE — Progress Notes (Signed)
Occupational Therapy Treatment Patient Details Name: Zachary Mccann MRN: [id number] DOB: [birth year] Today's Date: 07/16/2019    History of present illness Patient is a 48 y/o male presenting to the ED on 07/10/2019 with primary complaints of slurred speech, R facial droop. CT head-unremarkable. MRI pending. Past medical history significant for CVA, hypertension, CKD 3, coronary artery disease.    OT comments  Pt performing a 3 sit to stands with modA +2 and RUE supported and leaning on bed. Pt able to advance LLE 1 time forward and backwards before wanting to rest. Pt performing drinking task  With set-upA to minA to hold cup and for cueing. Pt requires cues for tilting head back via SLP directions written in room. Pt tolerating transfers wtjh stedy and improved confidence in standing.  Pt used white board and dry erase marker for thoughts and thumbs up or down to communicate. Sessions to come focusing on ADL. OT following acutely.    Follow Up Recommendations  CIR    Equipment Recommendations  None recommended by OT    Recommendations for Other Services      Precautions / Restrictions Precautions Precautions: Fall Restrictions Weight Bearing Restrictions: No       Mobility Bed Mobility Overal bed mobility: Needs Assistance Bed Mobility: Supine to Sit Rolling: Min assist   Supine to sit: Mod assist;+2 for physical assistance     General bed mobility comments: assist to bring R LE/hips to EOB and to elevate trunk into sitting; cues for sequencing   Transfers Overall transfer level: Needs assistance Equipment used: Rolling walker (2 wheeled) Transfers: Sit to/from Stand Sit to Stand: Mod assist;+2 physical assistance;Min assist;+2 safety/equipment Stand pivot transfers: Max assist;+2 physical assistance       General transfer comment: pt stood several times during session utilizing Stedy standing frame; less assist required to power up from higher surface; assist to  support R UE     Balance Overall balance assessment: Needs assistance Sitting-balance support: Single extremity supported;Feet supported Sitting balance-Leahy Scale: Fair     Standing balance support: Bilateral upper extremity supported;During functional activity Standing balance-Leahy Scale: Poor Standing balance comment: up to Max A for standing balance                           ADL either performed or assessed with clinical judgement   ADL Overall ADL's : Needs assistance/impaired Eating/Feeding: Set up;Minimal assistance;Sitting;Cueing for safety;Cueing for sequencing   Grooming: Minimal assistance;Sitting Grooming Details (indicate cue type and reason): uses LUE                             Functional mobility during ADLs: Moderate assistance;+2 for physical assistance;Cueing for safety;Cueing for sequencing General ADL Comments: drinking from cup, using suction and writing on board with minguardA to set-upA     Vision   Vision Assessment?: No apparent visual deficits   Perception     Praxis      Cognition Arousal/Alertness: Awake/alert Behavior During Therapy: Flat affect Overall Cognitive Status: Impaired/Different from baseline Area of Impairment: Following commands;Safety/judgement;Problem solving                   Current Attention Level: Sustained   Following Commands: Follows one step commands inconsistently;Follows one step commands with increased time Safety/Judgement: Decreased awareness of safety;Decreased awareness of deficits   Problem Solving: Difficulty sequencing;Requires tactile cues;Requires verbal cues;Slow processing General Comments: increaed  time for processing; tends to use "thumbs up/down"        Exercises     Shoulder Instructions       General Comments      Pertinent Vitals/ Pain       Pain Assessment: No/denies pain  Home Living                                          Prior  Functioning/Environment              Frequency  Min 2X/week        Progress Toward Goals  OT Goals(current goals can now be found in the care plan section)  Progress towards OT goals: Progressing toward goals  Acute Rehab OT Goals Patient Stated Goal: did not state  OT Goal Formulation: With patient Time For Goal Achievement: 07/25/19 Potential to Achieve Goals: Good ADL Goals Pt Will Perform Grooming: with set-up;sitting Pt Will Perform Upper Body Bathing: with set-up;with supervision;sitting Pt Will Perform Lower Body Bathing: with mod assist;sit to/from stand Pt Will Perform Upper Body Dressing: with min assist;sitting Pt Will Perform Lower Body Dressing: with mod assist;sit to/from stand Pt Will Transfer to Toilet: with mod assist;stand pivot transfer;bedside commode Pt Will Perform Toileting - Clothing Manipulation and hygiene: with mod assist;sit to/from stand Additional ADL Goal #1: Pt will reach for objects at 120* shoulder flexion Rt uE  Plan Discharge plan remains appropriate    Co-evaluation    PT/OT/SLP Co-Evaluation/Treatment: Yes Reason for Co-Treatment: To address functional/ADL transfers;Complexity of the patient's impairments (multi-system involvement)   OT goals addressed during session: ADL's and self-care      AM-PAC OT "6 Clicks" Daily Activity     Outcome Measure   Help from another person eating meals?: A Little Help from another person taking care of personal grooming?: A Little Help from another person toileting, which includes using toliet, bedpan, or urinal?: A Lot Help from another person bathing (including washing, rinsing, drying)?: A Lot Help from another person to put on and taking off regular upper body clothing?: A Lot Help from another person to put on and taking off regular lower body clothing?: Total 6 Click Score: 13    End of Session Equipment Utilized During Treatment: Gait belt  OT Visit Diagnosis: Unsteadiness on feet  (R26.81);Hemiplegia and hemiparesis Hemiplegia - Right/Left: Right Hemiplegia - dominant/non-dominant: Non-Dominant Hemiplegia - caused by: Cerebral infarction   Activity Tolerance Patient tolerated treatment well   Patient Left in chair;with call bell/phone within reach;with chair alarm set   Nurse Communication Mobility status;Need for lift equipment        Time: 4008-6761 OT Time Calculation (min): 42 min  Charges: OT General Charges $OT Visit: 1 Visit OT Treatments $Self Care/Home Management : 8-22 mins  Cristi Loron) Glendell Docker OTR/L Acute Rehabilitation Services Pager: [phone] Office: [phone]    Lonzo Cloud 07/16/2019, 2:54 PM

## 2019-07-16 NOTE — Progress Notes (Signed)
  Speech Language Pathology Treatment: Cognitive-Linquistic(Apraxia)  Patient Details Name: Zachary Mccann MRN: [id number] DOB: Jan 24, 1971 Today's Date: 07/16/2019 Time: 3748-2707 SLP Time Calculation (min) (ACUTE ONLY): 25 min  Assessment / Plan / Recommendation Clinical Impression  Pt was seen for treatment and cooperative throughout the sesison but reported (via written communication) that he was fatigued and did not sleep well last night. Pt and SLP were able to participate in a brief conversation regarding whether or not his phone charger had been brought to the hospital and where it was. Pt was reliable with his use of short written phrases and gestures to communicate. Pt was able to imitate articulatory placement for bilabial phonemes with 75% accuracy increasing to 100% with repetition and tactile cues. He approximate vocalization of sustained vowels with 50% accuracy and continues to demonstrate some spontaneous vocalization. Pt's imitation of lingua-dental articulatory placement was attempted but pt reported again that he was fagitued and it was agreed that the session be terminated. SLP will continue to follow pt.    HPI HPI: Patient is a 48 y/o male presenting to the ED on 07/10/2019 with primary complaints of slurred speech, R facial droop. MRI extensive left hemisphere acute/subacute white matter infarcts Extensive left hemisphere acute/subacute white matter infarcts involving the left centrum semi ovale and corona radiata. Scattered punctate foci of cortical infarct involving the anterior left frontal lobe and left parietal lobe. Occluded left internal carotid artery with reconstitution of the level of the left posterior communicating artery.  Past medical history significant for CVA, hypertension, CKD 3, coronary artery disease.       SLP Plan  Continue with current plan of care       Recommendations                   Oral Care Recommendations: Oral care BID Follow up  Recommendations: Inpatient Rehab SLP Visit Diagnosis: Apraxia (R48.2) Plan: Continue with current plan of care       Dexter Signor I. Hardin Negus, [place], [place] Office number [phone] Pager [place] 07/16/2019, 10:31 AM

## 2019-07-16 NOTE — H&P (Signed)
Physical Medicine and Rehabilitation Admission H&P    Chief Complaint  Patient presents with  . Altered Mental Status  : HPI: Zachary Mccann is a 48 year old right-handed male with history of left cerebellar CVA September 2019, CAD non-STEMI status post stenting May 2019 followed by Dr. Carlyle Dolly maintained on Effient in the past, hypertension, CKD stage III with creatinine 1.82-2.38 tobacco abuse as well as medical noncompliance.  Per chart review patient lives with fianc.  2 level home with bedroom on Main level and 2 steps to entry.  Patient uses a cane and a walker prior to admission.  Presented 07/10/2019 with aphasia, right facial droop and right side weakness.  Urine drug screen positive marijuana, COVID negative, creatinine 2.38.  Cranial CT scan showed areas of prior infarction in the superior aspect of the left cerebellum.  No evidence of acute hemorrhage noted.  Patient did not receive TPA.  MRI/MRA showed occluded left internal carotid artery with reconstitution of the level of the left posterior communicating artery.  Scattered punctate foci of cortical infarct involving the anterior left frontal lobe and left parietal lobe.  Extensive left hemisphere acute subacute white matter infarcts involving the left centrum semiovale and corona radiata as well as border zone areas.  Echocardiogram with ejection fraction of 40%.  Neurology consulted presently on Plavix for CVA prophylaxis.  Patient does have an allergy to aspirin.  Full liquid diet with nectar thick consistency.  Therapy evaluation completed and patient was admitted for a comprehensive rehab program  Review of Systems  Constitutional: Negative for chills and fever.  HENT: Negative for hearing loss.   Eyes: Negative for blurred vision and double vision.  Respiratory: Negative for shortness of breath.   Cardiovascular: Positive for leg swelling. Negative for chest pain and palpitations.  Gastrointestinal: Positive for  constipation. Negative for heartburn, nausea and vomiting.  Genitourinary: Negative for dysuria, flank pain and hematuria.  Musculoskeletal: Positive for myalgias.  Skin: Negative for rash.  Neurological: Positive for speech change and weakness.  All other systems reviewed and are negative.  Past Medical History:  Diagnosis Date  . CAD (coronary artery disease)    a. s/p NSTEMI in 04/2018 with DES to LCx.   [name] Hypertension   . Myocardial infarction ([facility])   . Pneumonia   . Stroke [facility])    Past Surgical History:  Procedure Laterality Date  . CORONARY STENT INTERVENTION N/A 05/05/2018   Procedure: CORONARY STENT INTERVENTION;  Surgeon: Leonie Man, MD;  Location: [facility];  Service: Cardiovascular;  Laterality: N/A;  . LEFT HEART CATH AND CORONARY ANGIOGRAPHY N/A 05/05/2018   Procedure: LEFT HEART CATH AND CORONARY ANGIOGRAPHY;  Surgeon: Leonie Man, MD;  Location: [facility];  Service: Cardiovascular;  Laterality: N/A;  . TEE WITHOUT CARDIOVERSION N/A 09/08/2018   Procedure: TRANSESOPHAGEAL ECHOCARDIOGRAM (TEE);  Surgeon: Larey Dresser, MD;  Location: [facility] ENDOSCOPY;  Service: Cardiovascular;  Laterality: N/A;   Family History  Problem Relation Age of Onset  . Hypertension Mother   . Stroke Mother   . Dementia Mother   . Hypertension Father   . Stroke Father   . Cancer Father   . Alcoholism Father   . Cancer Sister    Social History:  reports that he has been smoking cigarettes. He has been smoking about 0.25 packs per day. He has never used smokeless tobacco. He reports previous alcohol use. He reports previous drug use. Allergies:  Allergies  Allergen Reactions  .  Aspirin Swelling   Medications Prior to Admission  Medication Sig Dispense Refill  . carvedilol (COREG) 3.125 MG tablet Take 3.125 mg by mouth 2 (two) times daily with a meal.       Drug Regimen Review Drug regimen was reviewed and remains appropriate with no significant issues  identified  Home: Home Living Family/patient expects to be discharged to:: Private residence Living Arrangements: Spouse/significant other Available Help at Discharge: Available 24 hours/day Type of Home: House Home Access: Stairs to enter [employer] of Steps: 2 Entrance Stairs-Rails: Right Home Layout: Two level, Able to live on main level with bedroom/bathroom Bathroom Shower/Tub: Health visitor: Standard Home Equipment: Environmental consultant - 2 wheels, Shower seat, Bedside commode   Functional History: Prior Function Level of Independence: Needs assistance Gait / Transfers Assistance Needed: reports he would walk with RW ADL's / Homemaking Assistance Needed: family available to assist with ADLs and meals  Functional Status:  Mobility: Bed Mobility Overal bed mobility: Needs Assistance Bed Mobility: Supine to Sit Rolling: Min assist Supine to sit: Mod assist, +2 for physical assistance General bed mobility comments: assist to bring R LE/hips to EOB and to elevate trunk into sitting; cues for sequencing  Transfers Overall transfer level: Needs assistance Equipment used: 2 person hand held assist Transfer via Lift Equipment: Stedy Transfers: Sit to/from Stand Sit to Stand: Mod assist, +2 physical assistance, Min assist, +2 safety/equipment Stand pivot transfers: Max assist, +2 physical assistance  Lateral/Scoot Transfers: Mod assist, Max assist, +2 physical assistance(block RLE due to extensor pattern) General transfer comment: pt stood X 3 trials with use of standing frame with R knee blocked for safety and R UE supported; use of gait belt to assist with power up into standing; Stedy standing frame used for transfer bed to recliner for safety Ambulation/Gait General Gait Details: worked on pre gait activiites standing at [employer] with +2 assist; pt able to step forward/backward with L LE with R LE blocked for safety    ADL: ADL Overall ADL's : Needs  assistance/impaired Eating/Feeding: Set up, Minimal assistance, Sitting, Cueing for safety, Cueing for sequencing Grooming: Minimal assistance, Sitting Grooming Details (indicate cue type and reason): uses LUE Upper Body Bathing: Moderate assistance, Bed level Lower Body Bathing: Maximal assistance, Sit to/from stand Lower Body Bathing Details (indicate cue type and reason): Able to stand while theraist assisted with pericare Upper Body Dressing : Maximal assistance, Bed level Lower Body Dressing: Total assistance, Bed level Toilet Transfer: Total assistance Toilet Transfer Details (indicate cue type and reason): unable due to fatigue  Toileting- Clothing Manipulation and Hygiene: Total assistance, Bed level Functional mobility during ADLs: Moderate assistance, +2 for physical assistance, Cueing for safety, Cueing for sequencing General ADL Comments: drinking from cup, using suction and writing on board with minguardA to set-upA  Cognition: Cognition Overall Cognitive Status: Impaired/Different from baseline Arousal/Alertness: Awake/alert Orientation Level: Oriented X4 Attention: Sustained Sustained Attention: Impaired Sustained Attention Impairment: Functional basic Memory: (continue to address ) Awareness: Impaired Awareness Impairment: Emergent impairment Problem Solving: (continue to assess in diagnostic tx) Safety/Judgment: Impaired Cognition Arousal/Alertness: Awake/alert Behavior During Therapy: Flat affect Overall Cognitive Status: Impaired/Different from baseline Area of Impairment: Following commands, Safety/judgement, Problem solving Current Attention Level: Sustained Following Commands: Follows one step commands inconsistently, Follows one step commands with increased time Safety/Judgement: Decreased awareness of safety, Decreased awareness of deficits Problem Solving: Difficulty sequencing, Requires tactile cues, Requires verbal cues, Slow processing General  Comments: increaed time for processing; tends to use "thumbs up/down" Difficult  to assess due to: Impaired communication  Physical Exam: Blood pressure 129/82, pulse 82, temperature 98.6 F (37 C), temperature source Oral, resp. rate 17, height 6' (1.829 m), weight (!) 147 kg, SpO2 97 %. Physical Exam  Constitutional: No distress.  obese  HENT:  Head: Normocephalic and atraumatic.  Eyes: Pupils are equal, round, and reactive to light. EOM are normal.  Neck: No thyromegaly present.  Cardiovascular: Normal rate and regular rhythm. Exam reveals no friction rub.  No murmur heard. Respiratory: Effort normal. No respiratory distress. He has no wheezes.  GI: Soft. He exhibits no distension. There is no abdominal tenderness. There is no rebound.  Musculoskeletal:        General: Edema (1++RUE and 1+ RLE) present. No deformity.  Neurological:  Alert, makes good eye contact to right. LUE and LLE 3-4/5. RUE 0/5. RLE tr/5 HF, KE and APF, 0/5 ADF. Decreased LT and pain in right arm and leg. Right central 7. Uses thumbs up/down to communicate, non-verbal. Followed most simple one step commands.   Skin: Skin is warm and dry.  Psychiatric:  Flat but cooperative    Results for orders placed or performed during the hospital encounter of 07/10/19 (from the past 48 hour(s))  Basic metabolic panel     Status: Abnormal   Collection Time: 07/16/19  4:12 AM  Result Value Ref Range   Sodium 142 135 - 145 mmol/L   Potassium 4.0 3.5 - 5.1 mmol/L   Chloride 111 98 - 111 mmol/L   CO2 22 22 - 32 mmol/L   Glucose, Bld 102 (H) 70 - 99 mg/dL   BUN 14 6 - 20 mg/dL   Creatinine, Ser 1.611.63 (H) 0.61 - 1.24 mg/dL   Calcium 9.3 8.9 - 09.610.3 mg/dL   GFR calc non Af Amer 49 (L) >60 mL/min   GFR calc Af Amer 57 (L) >60 mL/min   Anion gap 9 5 - 15    Comment: Performed at [facility], [address] [place], [place] [address]  CBC with Differential/Platelet     Status: Abnormal   Collection Time: 07/16/19  4:12  AM  Result Value Ref Range   WBC 9.1 4.0 - 10.5 K/uL   RBC 4.15 (L) 4.22 - 5.81 MIL/uL   Hemoglobin 13.2 13.0 - 17.0 g/dL   HCT 98.139.9 19.139.0 - 47.852.0 %   MCV 96.1 80.0 - 100.0 fL   MCH 31.8 26.0 - 34.0 pg   MCHC 33.1 30.0 - 36.0 g/dL   RDW 29.511.6 62.111.5 - 30.815.5 %   Platelets 225 150 - 400 K/uL   nRBC 0.0 0.0 - 0.2 %   Neutrophils Relative % 69 %   Neutro Abs 6.2 1.7 - 7.7 K/uL   Lymphocytes Relative 18 %   Lymphs Abs 1.6 0.7 - 4.0 K/uL   Monocytes Relative 10 %   Monocytes Absolute 0.9 0.1 - 1.0 K/uL   Eosinophils Relative 3 %   Eosinophils Absolute 0.3 0.0 - 0.5 K/uL   Basophils Relative 0 %   Basophils Absolute 0.0 0.0 - 0.1 K/uL   Immature Granulocytes 0 %   Abs Immature Granulocytes 0.03 0.00 - 0.07 K/uL    Comment: Performed at [facility], [address] [place], [place] [address]   No results found.     Medical Problem List and Plan: 1.  Right side weakness, aphasia, dysphasia secondary to left MCA and ACA scattered infarct due to left ICA occlusion as well as history  of previous stroke in the past and medical noncompliance  -admit to inpatient rehab  -PRAFO and WHO for RLE and RUE 2.  Antithrombotics: -DVT/anticoagulation: SCDs  -antiplatelet therapy: plavix 75mg  daily 3. Pain Management: Tylenol as needed 4. Mood: Provide emotional support  -antipsychotic agents: N/A 5. Neuropsych: This patient is capable of making decisions on his own behalf. 6. Skin/Wound Care: Routine skin checks 7. Fluids/Electrolytes/Nutrition: Routine in and outs with follow-up chemistries 8.  Dysphagia.  Full liquid diet nectar thick consistency.  Follow-up speech therapy  -aspiration precautions 9.  History of CAD with myocardial infarction status post stenting.  Continue Plavix.  Patient is followed by Dr. Dina RichJonathan Branch 10.  Hypertension.  Coreg 3.125 mg twice daily.  Monitor with increased mobility 11.  Hyperlipidemia.  Lipitor 12.  History of tobacco as well as marijuana use.   Counseling       Charlton AmorDaniel J Angiulli, PA-C 07/17/2019

## 2019-07-16 NOTE — Progress Notes (Signed)
  Speech Language Pathology Treatment: Dysphagia  Patient Details Name: Zachary Mccann MRN: [id number] DOB: 08-12-1971 Today's Date: 07/16/2019 Time: 7939-0300 SLP Time Calculation (min) (ACUTE ONLY): 22 min  Assessment / Plan / Recommendation Clinical Impression  Pt was seen for dysphagia treatment.  Sitting in chair, getting sleepy but willing to participate.  Demonstrates improved attention to secretion management and spillage from right side.  Consumed ten tspns of nectar thick liquid (50% of trials with pt self-feeding) with verbal/tactile cues needed for delivery of PO to left lateral tongue, head tilt followed by hard swallow.  Coughing notable on only first presentation with no further episodes.  Pt with minimal anterior projection of tongue, demonstrating ability to extend tip just beyond central incisors - pt able to achieve limited anterior projection then posterior retraction in succession.  Unable to cough or vocalize on command.    Primary concern re: swallowing is pt's ability to meet nutritional needs given extent of oral dysphagia.  He is being followed by RD.  SLP will continue to follow for therapeutic exercises.     HPI HPI: Patient is a 48 y/o male presenting to the ED on 07/10/2019 with primary complaints of slurred speech, R facial droop. MRI extensive left hemisphere acute/subacute white matter infarcts Extensive left hemisphere acute/subacute white matter infarcts involving the left centrum semi ovale and corona radiata. Scattered punctate foci of cortical infarct involving the anterior left frontal lobe and left parietal lobe. Occluded left internal carotid artery with reconstitution of the level of the left posterior communicating artery.  Past medical history significant for CVA, hypertension, CKD 3, coronary artery disease.       SLP Plan  Continue with current plan of care       Recommendations  Diet recommendations: Dysphagia 1 (puree);Nectar-thick liquid Liquids  provided via: Teaspoon;Cup Medication Administration: Crushed with puree Supervision: Patient able to self feed;Staff to assist with self feeding Compensations: Small sips/bites;Other (Comment)(head tilt backwards, spoon delivery left side of mouth) Postural Changes and/or Swallow Maneuvers: Seated upright 90 degrees                Oral Care Recommendations: Oral care BID Follow up Recommendations: Inpatient Rehab SLP Visit Diagnosis: Dysphagia, oropharyngeal phase (R13.12) Plan: Continue with current plan of care       GO              Zachary Mccann, [place] CCC/SLP Acute Rehabilitation Services Office number [phone] Pager [phone]   Zachary Mccann 07/16/2019, 2:33 PM

## 2019-07-17 ENCOUNTER — Other Ambulatory Visit: Payer: Self-pay

## 2019-07-17 ENCOUNTER — Inpatient Hospital Stay (HOSPITAL_COMMUNITY): Payer: Medicaid Other

## 2019-07-17 ENCOUNTER — Inpatient Hospital Stay (HOSPITAL_COMMUNITY)
Admission: RE | Admit: 2019-07-17 | Discharge: 2019-08-12 | DRG: 057 | Disposition: A | Discharge status: Home Health | Payer: Medicaid Other | Source: Intra-hospital | Attending: Physical Medicine & Rehabilitation | Admitting: Physical Medicine & Rehabilitation

## 2019-07-17 ENCOUNTER — Encounter (HOSPITAL_COMMUNITY): Payer: Self-pay

## 2019-07-17 DIAGNOSIS — R4701 Aphasia: Secondary | ICD-10-CM

## 2019-07-17 DIAGNOSIS — Z6841 Body Mass Index (BMI) 40.0 and over, adult: Secondary | ICD-10-CM

## 2019-07-17 DIAGNOSIS — I43 Cardiomyopathy in diseases classified elsewhere: Secondary | ICD-10-CM | POA: Diagnosis present

## 2019-07-17 DIAGNOSIS — I5022 Chronic systolic (congestive) heart failure: Secondary | ICD-10-CM | POA: Diagnosis present

## 2019-07-17 DIAGNOSIS — G479 Sleep disorder, unspecified: Secondary | ICD-10-CM | POA: Diagnosis present

## 2019-07-17 DIAGNOSIS — F17210 Nicotine dependence, cigarettes, uncomplicated: Secondary | ICD-10-CM | POA: Diagnosis present

## 2019-07-17 DIAGNOSIS — I252 Old myocardial infarction: Secondary | ICD-10-CM | POA: Diagnosis not present

## 2019-07-17 DIAGNOSIS — M7661 Achilles tendinitis, right leg: Secondary | ICD-10-CM | POA: Diagnosis not present

## 2019-07-17 DIAGNOSIS — R05 Cough: Secondary | ICD-10-CM

## 2019-07-17 DIAGNOSIS — N390 Urinary tract infection, site not specified: Secondary | ICD-10-CM | POA: Diagnosis present

## 2019-07-17 DIAGNOSIS — I6522 Occlusion and stenosis of left carotid artery: Secondary | ICD-10-CM | POA: Diagnosis present

## 2019-07-17 DIAGNOSIS — I69391 Dysphagia following cerebral infarction: Secondary | ICD-10-CM

## 2019-07-17 DIAGNOSIS — I5030 Unspecified diastolic (congestive) heart failure: Secondary | ICD-10-CM

## 2019-07-17 DIAGNOSIS — I63512 Cerebral infarction due to unspecified occlusion or stenosis of left middle cerebral artery: Secondary | ICD-10-CM | POA: Diagnosis present

## 2019-07-17 DIAGNOSIS — Z823 Family history of stroke: Secondary | ICD-10-CM

## 2019-07-17 DIAGNOSIS — I517 Cardiomegaly: Secondary | ICD-10-CM

## 2019-07-17 DIAGNOSIS — I69321 Dysphasia following cerebral infarction: Secondary | ICD-10-CM

## 2019-07-17 DIAGNOSIS — I5021 Acute systolic (congestive) heart failure: Secondary | ICD-10-CM

## 2019-07-17 DIAGNOSIS — N183 Chronic kidney disease, stage 3 unspecified: Secondary | ICD-10-CM

## 2019-07-17 DIAGNOSIS — I2510 Atherosclerotic heart disease of native coronary artery without angina pectoris: Secondary | ICD-10-CM | POA: Diagnosis present

## 2019-07-17 DIAGNOSIS — Z8249 Family history of ischemic heart disease and other diseases of the circulatory system: Secondary | ICD-10-CM

## 2019-07-17 DIAGNOSIS — I130 Hypertensive heart and chronic kidney disease with heart failure and stage 1 through stage 4 chronic kidney disease, or unspecified chronic kidney disease: Secondary | ICD-10-CM | POA: Diagnosis present

## 2019-07-17 DIAGNOSIS — I69359 Hemiplegia and hemiparesis following cerebral infarction affecting unspecified side: Secondary | ICD-10-CM

## 2019-07-17 DIAGNOSIS — R482 Apraxia: Secondary | ICD-10-CM | POA: Diagnosis present

## 2019-07-17 DIAGNOSIS — E785 Hyperlipidemia, unspecified: Secondary | ICD-10-CM | POA: Diagnosis present

## 2019-07-17 DIAGNOSIS — R1310 Dysphagia, unspecified: Secondary | ICD-10-CM | POA: Diagnosis present

## 2019-07-17 DIAGNOSIS — Z886 Allergy status to analgesic agent status: Secondary | ICD-10-CM | POA: Diagnosis not present

## 2019-07-17 DIAGNOSIS — I10 Essential (primary) hypertension: Secondary | ICD-10-CM

## 2019-07-17 DIAGNOSIS — I69392 Facial weakness following cerebral infarction: Secondary | ICD-10-CM

## 2019-07-17 DIAGNOSIS — I69320 Aphasia following cerebral infarction: Secondary | ICD-10-CM

## 2019-07-17 DIAGNOSIS — R059 Cough, unspecified: Secondary | ICD-10-CM

## 2019-07-17 DIAGNOSIS — B9689 Other specified bacterial agents as the cause of diseases classified elsewhere: Secondary | ICD-10-CM | POA: Diagnosis present

## 2019-07-17 DIAGNOSIS — F1290 Cannabis use, unspecified, uncomplicated: Secondary | ICD-10-CM | POA: Diagnosis present

## 2019-07-17 DIAGNOSIS — R0989 Other specified symptoms and signs involving the circulatory and respiratory systems: Secondary | ICD-10-CM

## 2019-07-17 DIAGNOSIS — R1312 Dysphagia, oropharyngeal phase: Secondary | ICD-10-CM

## 2019-07-17 DIAGNOSIS — I69351 Hemiplegia and hemiparesis following cerebral infarction affecting right dominant side: Principal | ICD-10-CM

## 2019-07-17 DIAGNOSIS — D72829 Elevated white blood cell count, unspecified: Secondary | ICD-10-CM

## 2019-07-17 DIAGNOSIS — R52 Pain, unspecified: Secondary | ICD-10-CM

## 2019-07-17 MED ORDER — CLOPIDOGREL BISULFATE 75 MG PO TABS
75.0000 mg | ORAL_TABLET | Freq: Every day | ORAL | Status: DC
  Administered 2019-07-17 – 2019-08-12 (×27): 75 mg via ORAL
  Filled 2019-07-17 (×27): qty 1

## 2019-07-17 MED ORDER — HYDRALAZINE HCL 10 MG PO TABS
10.0000 mg | ORAL_TABLET | Freq: Three times a day (TID) | ORAL | Status: DC
  Administered 2019-07-17 – 2019-07-20 (×8): 10 mg via ORAL
  Filled 2019-07-17 (×9): qty 1

## 2019-07-17 MED ORDER — CARVEDILOL 3.125 MG PO TABS
3.1250 mg | ORAL_TABLET | Freq: Two times a day (BID) | ORAL | Status: DC
  Administered 2019-07-17 – 2019-07-29 (×24): 3.125 mg via ORAL
  Filled 2019-07-17 (×24): qty 1

## 2019-07-17 MED ORDER — SENNOSIDES-DOCUSATE SODIUM 8.6-50 MG PO TABS
1.0000 | ORAL_TABLET | Freq: Every evening | ORAL | Status: DC | PRN
  Administered 2019-07-22 – 2019-07-25 (×3): 1 via ORAL
  Filled 2019-07-17 (×2): qty 1

## 2019-07-17 MED ORDER — ACETAMINOPHEN 160 MG/5ML PO SOLN: 650.0000 mg | ORAL | Status: DC | PRN

## 2019-07-17 MED ORDER — ATORVASTATIN CALCIUM 40 MG PO TABS
40.0000 mg | ORAL_TABLET | Freq: Every day | ORAL | Status: DC
  Administered 2019-07-17 – 2019-08-11 (×25): 40 mg via ORAL
  Filled 2019-07-17 (×26): qty 1

## 2019-07-17 MED ORDER — CLOPIDOGREL BISULFATE 75 MG PO TABS: 75.0000 mg | ORAL_TABLET | Freq: Every day | ORAL | 0 refills | Status: DC

## 2019-07-17 MED ORDER — ACETAMINOPHEN 650 MG RE SUPP: 650.0000 mg | RECTAL | Status: DC | PRN

## 2019-07-17 MED ORDER — ATORVASTATIN CALCIUM 40 MG PO TABS: 40.0000 mg | ORAL_TABLET | Freq: Every day | ORAL | 0 refills | Status: DC

## 2019-07-17 MED ORDER — ENSURE ENLIVE PO LIQD: 237.0000 mL | Freq: Three times a day (TID) | ORAL | 12 refills | Status: DC

## 2019-07-17 MED ORDER — ACETAMINOPHEN 325 MG PO TABS
650.0000 mg | ORAL_TABLET | ORAL | Status: DC | PRN
  Administered 2019-07-23 – 2019-08-11 (×7): 650 mg via ORAL
  Filled 2019-07-17 (×10): qty 2

## 2019-07-17 MED ORDER — ENSURE ENLIVE PO LIQD
237.0000 mL | Freq: Three times a day (TID) | ORAL | Status: DC
  Administered 2019-07-17 – 2019-08-10 (×43): 237 mL via ORAL

## 2019-07-17 NOTE — H&P (Signed)
Physical Medicine and Rehabilitation Admission H&P        Chief Complaint  Patient presents with  . Altered Mental Status  : HPI: Zachary SkeensShawn T. Mccann is a 48 year old right-handed male with history of left cerebellar CVA September 2019, CAD non-STEMI status post stenting May 2019 followed by Dr. Dina RichJonathan Branch maintained on Effient in the past, hypertension, CKD stage III with creatinine 1.82-2.38 tobacco abuse as well as medical noncompliance.  Per chart review patient lives with fianc.  2 level home with bedroom on Main level and 2 steps to entry.  Patient uses a cane and a walker prior to admission.  Presented 07/10/2019 with aphasia, right facial droop and right side weakness.  Urine drug screen positive marijuana, COVID negative, creatinine 2.38.  Cranial CT scan showed areas of prior infarction in the superior aspect of the left cerebellum.  No evidence of acute hemorrhage noted.  Patient did not receive TPA.  MRI/MRA showed occluded left internal carotid artery with reconstitution of the level of the left posterior communicating artery.  Scattered punctate foci of cortical infarct involving the anterior left frontal lobe and left parietal lobe.  Extensive left hemisphere acute subacute white matter infarcts involving the left centrum semiovale and corona radiata as well as border zone areas.  Echocardiogram with ejection fraction of 40%.  Neurology consulted presently on Plavix for CVA prophylaxis.  Patient does have an allergy to aspirin.  Full liquid diet with nectar thick consistency.  Therapy evaluation completed and patient was admitted for a comprehensive rehab program   Review of Systems  Constitutional: Negative for chills and fever.  HENT: Negative for hearing loss.   Eyes: Negative for blurred vision and double vision.  Respiratory: Negative for shortness of breath.   Cardiovascular: Positive for leg swelling. Negative for chest pain and palpitations.  Gastrointestinal:  Positive for constipation. Negative for heartburn, nausea and vomiting.  Genitourinary: Negative for dysuria, flank pain and hematuria.  Musculoskeletal: Positive for myalgias.  Skin: Negative for rash.  Neurological: Positive for speech change and weakness.  All other systems reviewed and are negative.       Past Medical History:  Diagnosis Date  . CAD (coronary artery disease)      a. s/p NSTEMI in 04/2018 with DES to LCx.   Zachary Mccann. Hypertension    . Myocardial infarction (HCC)    . Pneumonia    . Stroke [facility](HCC)          Past Surgical History:  Procedure Laterality Date  . CORONARY STENT INTERVENTION N/A 05/05/2018    Procedure: CORONARY STENT INTERVENTION;  Surgeon: Marykay LexHarding, David W, MD;  [facility];  Service: Cardiovascular;  Laterality: N/A;  . LEFT HEART CATH AND CORONARY ANGIOGRAPHY N/A 05/05/2018    Procedure: LEFT HEART CATH AND CORONARY ANGIOGRAPHY;  Surgeon: Marykay LexHarding, David W, MD;  [facility];  Service: Cardiovascular;  Laterality: N/A;  . TEE WITHOUT CARDIOVERSION N/A 09/08/2018    Procedure: TRANSESOPHAGEAL ECHOCARDIOGRAM (TEE);  Surgeon: Laurey MoraleMcLean, Dalton S, MD;  [facility];  Service: Cardiovascular;  Laterality: N/A;        Family History  Problem Relation Age of Onset  . Hypertension Mother    . Stroke Mother    . Dementia Mother    . Hypertension Father    . Stroke Father    . Cancer Father    . Alcoholism Father    . Cancer Sister     Social History:  reports that he has been smoking cigarettes. He has been smoking about 0.25 packs per day. He has never used smokeless tobacco. He reports previous alcohol use. He reports previous drug use. Allergies:      Allergies  Allergen Reactions  . Aspirin Swelling         Medications Prior to Admission  Medication Sig Dispense Refill  . carvedilol (COREG) 3.125 MG tablet Take 3.125 mg by mouth 2 (two) times daily with a meal.          Drug Regimen Review Drug regimen was  reviewed and remains appropriate with no significant issues identified   Home: Home Living Family/patient expects to be discharged to:: Private residence Living Arrangements: Spouse/significant other Available Help at Discharge: Available 24 hours/day Type of Home: House Home Access: Stairs to enter [employer] of Steps: 2 Entrance Stairs-Rails: Right Home Layout: Two level, Able to live on main level with bedroom/bathroom Bathroom Shower/Tub: Health visitor: Standard Home Equipment: Environmental consultant - 2 wheels, Shower seat, Bedside commode   Functional History: Prior Function Level of Independence: Needs assistance Gait / Transfers Assistance Needed: reports he would walk with RW ADL's / Homemaking Assistance Needed: family available to assist with ADLs and meals   Functional Status:  Mobility: Bed Mobility Overal bed mobility: Needs Assistance Bed Mobility: Supine to Sit Rolling: Min assist Supine to sit: Mod assist, +2 for physical assistance General bed mobility comments: assist to bring R LE/hips to EOB and to elevate trunk into sitting; cues for sequencing  Transfers Overall transfer level: Needs assistance Equipment used: 2 person hand held assist Transfer via Lift Equipment: Stedy Transfers: Sit to/from Stand Sit to Stand: Mod assist, +2 physical assistance, Min assist, +2 safety/equipment Stand pivot transfers: Max assist, +2 physical assistance  Lateral/Scoot Transfers: Mod assist, Max assist, +2 physical assistance(block RLE due to extensor pattern) General transfer comment: pt stood X 3 trials with use of standing frame with R knee blocked for safety and R UE supported; use of gait belt to assist with power up into standing; Stedy standing frame used for transfer bed to recliner for safety Ambulation/Gait General Gait Details: worked on pre gait activiites standing at [employer] with +2 assist; pt able to step forward/backward with L LE with R LE  blocked for safety     ADL: ADL Overall ADL's : Needs assistance/impaired Eating/Feeding: Set up, Minimal assistance, Sitting, Cueing for safety, Cueing for sequencing Grooming: Minimal assistance, Sitting Grooming Details (indicate cue type and reason): uses LUE Upper Body Bathing: Moderate assistance, Bed level Lower Body Bathing: Maximal assistance, Sit to/from stand Lower Body Bathing Details (indicate cue type and reason): Able to stand while theraist assisted with pericare Upper Body Dressing : Maximal assistance, Bed level Lower Body Dressing: Total assistance, Bed level Toilet Transfer: Total assistance Toilet Transfer Details (indicate cue type and reason): unable due to fatigue  Toileting- Clothing Manipulation and Hygiene: Total assistance, Bed level Functional mobility during ADLs: Moderate assistance, +2 for physical assistance, Cueing for safety, Cueing for sequencing General ADL Comments: drinking from cup, using suction and writing on board with [email or web address] to set-upA   Cognition: Cognition Overall Cognitive Status: Impaired/Different from baseline Arousal/Alertness: Awake/alert Orientation Level: Oriented X4 Attention: Sustained Sustained Attention: Impaired Sustained Attention Impairment: Functional basic Memory: (continue to address ) Awareness: Impaired Awareness Impairment: Emergent impairment Problem Solving: (continue to assess in diagnostic tx) Safety/Judgment: Impaired Cognition Arousal/Alertness: Awake/alert Behavior During Therapy: Flat affect Overall Cognitive Status: Impaired/Different from baseline Area of Impairment:  Following commands, Safety/judgement, Problem solving Current Attention Level: Sustained Following Commands: Follows one step commands inconsistently, Follows one step commands with increased time Safety/Judgement: Decreased awareness of safety, Decreased awareness of deficits Problem Solving: Difficulty sequencing, Requires tactile  cues, Requires verbal cues, Slow processing General Comments: increaed time for processing; tends to use "thumbs up/down" Difficult to assess due to: Impaired communication   Physical Exam: Blood pressure 129/82, pulse 82, temperature 98.6 F (37 C), temperature source Oral, resp. rate 17, height 6' (1.829 m), weight (!) 147 kg, SpO2 97 %. Physical Exam  Constitutional: No distress.  obese  HENT:  Head: Normocephalic and atraumatic.  Eyes: Pupils are equal, round, and reactive to light. EOM are normal.  Neck: No thyromegaly present.  Cardiovascular: Normal rate and regular rhythm. Exam reveals no friction rub.  No murmur heard. Respiratory: Effort normal. No respiratory distress. He has no wheezes.  GI: Soft. He exhibits no distension. There is no abdominal tenderness. There is no rebound.  Musculoskeletal:        General: Edema (1++RUE and 1+ RLE) present. No deformity.  Neurological:  Alert, makes good eye contact to right. LUE and LLE 3-4/5. RUE 0/5. RLE tr/5 HF, KE and APF, 0/5 ADF. Decreased LT and pain in right arm and leg. Right central 7. Uses thumbs up/down to communicate, non-verbal. Followed most simple one step commands.   Skin: Skin is warm and dry.  Psychiatric:  Flat but cooperative      Lab Results Last 48 Hours        Results for orders placed or performed during the hospital encounter of 07/10/19 (from the past 48 hour(s))  Basic metabolic panel     Status: Abnormal    Collection Time: 07/16/19  4:12 AM  Result Value Ref Range    Sodium 142 135 - 145 mmol/L    Potassium 4.0 3.5 - 5.1 mmol/L    Chloride 111 98 - 111 mmol/L    CO2 22 22 - 32 mmol/L    Glucose, Bld 102 (H) 70 - 99 mg/dL    BUN 14 6 - 20 mg/dL    Creatinine, Ser 4.541.63 (H) 0.61 - 1.24 mg/dL    Calcium 9.3 8.9 - 09.810.3 mg/dL    GFR calc non Af Amer 49 (L) >60 mL/min    GFR calc Af Amer 57 (L) >60 mL/min    Anion gap 9 5 - 15      Comment: Performed at [facility], [address]  [place], [place] [address]  CBC with Differential/Platelet     Status: Abnormal    Collection Time: 07/16/19  4:12 AM  Result Value Ref Range    WBC 9.1 4.0 - 10.5 K/uL    RBC 4.15 (L) 4.22 - 5.81 MIL/uL    Hemoglobin 13.2 13.0 - 17.0 g/dL    HCT 78.239.9 95.639.0 - 21.352.0 %    MCV 96.1 80.0 - 100.0 fL    MCH 31.8 26.0 - 34.0 pg    MCHC 33.1 30.0 - 36.0 g/dL    RDW 08.611.6 57.811.5 - 46.915.5 %    Platelets 225 150 - 400 K/uL    nRBC 0.0 0.0 - 0.2 %    Neutrophils Relative % 69 %    Neutro Abs 6.2 1.7 - 7.7 K/uL    Lymphocytes Relative 18 %    Lymphs Abs 1.6 0.7 - 4.0 K/uL    Monocytes Relative 10 %    Monocytes Absolute 0.9 0.1 -  1.0 K/uL    Eosinophils Relative 3 %    Eosinophils Absolute 0.3 0.0 - 0.5 K/uL    Basophils Relative 0 %    Basophils Absolute 0.0 0.0 - 0.1 K/uL    Immature Granulocytes 0 %    Abs Immature Granulocytes 0.03 0.00 - 0.07 K/uL      Comment: Performed at [facility], [place] [address], [place], [place] [address]     Imaging Results (Last 48 hours)  No results found.           Medical Problem List and Plan: 1.  Right side weakness, aphasia, dysphasia secondary to left MCA and ACA scattered infarct due to left ICA occlusion as well as history of previous stroke in the past and medical noncompliance             -admit to inpatient rehab             -PRAFO and WHO for RLE and RUE 2.  Antithrombotics: -DVT/anticoagulation: SCDs             -antiplatelet therapy: plavix 75mg  daily 3. Pain Management: Tylenol as needed 4. Mood: Provide emotional support             -antipsychotic agents: N/A 5. Neuropsych: This patient is capable of making decisions on his own behalf. 6. Skin/Wound Care: Routine skin checks 7. Fluids/Electrolytes/Nutrition: Routine in and outs with follow-up chemistries 8.  Dysphagia.  Full liquid diet nectar thick consistency.  Follow-up speech therapy             -aspiration precautions 9.  History of CAD with myocardial infarction status post  stenting.  Continue Plavix.  Patient is followed by Dr. Carlyle Dolly 10.  Hypertension.  Coreg 3.125 mg twice daily.  Monitor with increased mobility 11.  Hyperlipidemia.  Lipitor 12.  History of tobacco as well as marijuana use.  Counseling    Post Admission Physician Evaluation: 1. Functional deficits secondary  to left MCA and ACA infarcts. 2. Patient is admitted to receive collaborative, interdisciplinary care between the physiatrist, rehab nursing staff, and therapy team. 3. Patient's level of medical complexity and substantial therapy needs in context of that medical necessity cannot be provided at a lesser intensity of care such as a SNF. 4. Patient has experienced substantial functional loss from his/her baseline which was documented above under the "Functional History" and "Functional Status" headings.  Judging by the patient's diagnosis, physical exam, and functional history, the patient has potential for functional progress which will result in measurable gains while on inpatient rehab.  These gains will be of substantial and practical use upon discharge  in facilitating mobility and self-care at the household level. 5. Physiatrist will provide 24 hour management of medical needs as well as oversight of the therapy plan/treatment and provide guidance as appropriate regarding the interaction of the two. 6. The Preadmission Screening has been reviewed and patient status is unchanged unless otherwise stated above. 7. 24 hour rehab nursing will assist with bladder management, bowel management, safety, skin/wound care, disease management, medication administration, pain management and patient education  and help integrate therapy concepts, techniques,education, etc. 8. PT will assess and treat for/with: Lower extremity strength, range of motion, stamina, balance, functional mobility, safety, adaptive techniques and equipment, NMR.   Goals are: mod assist. 9. OT will assess and treat for/with:  ADL's, functional mobility, safety, upper extremity strength, adaptive techniques and equipment, NMR.   Goals are: mod assist. Therapy may proceed with  showering this patient. 10. SLP will assess and treat for/with: speech, language, swallowing, family ed.  Goals are: min to mod assist. 11. Case Management and Social Worker will assess and treat for psychological issues and discharge planning. 12. Team conference will be held weekly to assess progress toward goals and to determine barriers to discharge. 13. Patient will receive at least 3 hours of therapy per day at least 5 days per week. 14. ELOS: 16-22 days      15. Prognosis:  excellent   I have personally performed a face to face diagnostic evaluation of this patient and formulated the key components of the plan.  Additionally, I have personally reviewed laboratory data, imaging studies, as well as relevant notes and concur with the physician assistant's documentation above.  Ranelle OysterZachary T. Swartz, MD, FAAPMR           Mcarthur Rossettianiel J Angiulli, PA-C 07/17/2019

## 2019-07-17 NOTE — Progress Notes (Signed)
Zachary Staggers, MD  Physician  Physical Medicine and Rehabilitation  PMR Pre-admission  Signed  Date of Service:  07/16/2019 1:47 PM      Related encounter: ED to Hosp-Admission (Discharged) from 07/10/2019 in [facility]      Signed         PMR Admission Coordinator Pre-Admission Assessment  Patient: Zachary Mccann is an 48 y.o., male MRN: [id number] DOB: [birth year] Height: 6' (182.9 cm) Weight: (!) 147 kg  Insurance Information HMO:     PPO:      PCP:      IPA:      80/20:      OTHER:  PRIMARY: Uninsured (self pay)      Policy#:       Subscriber:  CM Name:       Phone#:      Fax#:  Pre-Cert#:       Employer:  Benefits:  Phone #:      Name:  Eff. Date:      Deduct:       Out of Pocket Max:       Life Max:  CIR: Pt is aware of estimated daily cost of care ($3,500)      SNF:  Outpatient:      Co-Pay:  Home Health:       Co-Pay:  DME:      Co-Pay:  Providers:  *pt's assigned financial counselor is Toniann Ket [phone]) she is following the case. She will follow up Wednesday 8/5 for pt's signature and plans to submit MAD application at that time.   SECONDARY:       Policy#:       Subscriber:  CM Name:       Phone#:      Fax#:  Pre-Cert#:       Employer: Benefits:  Phone #:      Name:  Eff. Date:      Deduct:       Out of Pocket Max:       Life Max:  CIR:      SNF:  Outpatient:      Co-Pay:  Home Health:       Co-Pay:  DME:      Co-Pay:   Medicaid Application Date:       Case Manager:  Disability Application Date:       Case Worker:   The Data Collection Information Summary for patients in Inpatient Rehabilitation Facilities with attached Privacy Act Ray Records was provided and verbally reviewed with: N/A  Emergency Contact Information         Contact Information    Name Relation Home Work Mobile   [place] Niece   [phone]   jowel, waltner Brother [phone]  [phone]    Nori Riis Significant other   [phone]      Current Medical History  Patient Admitting Diagnosis: Left MCA and ACA scattered infarct  History of Present Illness: Zachary Mccann is a 48 year old male with history of left cerebellar CVA September 2019, CAD non-STEMI status post stenting May 2019 followed by Dr. Carolanne Grumbling on Effient in the past, hypertension, CKD stage III with creatinine 1.82-2.38 tobacco abuseas well as medical noncompliance.Pt presented 07/10/2019 with aphasia, right facial droop and right side weakness. Urine drug screen positive marijuana, COVID negative, creatinine 2.38. Cranial CT scan showed areas of prior infarction in the superior aspect of the left cerebellum. No evidence of acute hemorrhage noted.  Patient did not receive TPA. MRI/MRA showed occluded left internal carotid artery with reconstitution of the level of the left posterior communicating artery. Scattered punctate foci of cortical infarct involving the anterior left frontal lobe and left parietal lobe. Extensive left hemisphere acute subacute white matter infarcts involving the left centrum semiovale and corona radiata as well as border zone areas. Echocardiogram with ejection fraction of 40%. Neurology consulted presently on Plavix for CVA prophylaxis. Patient does have an allergy to aspirin. Full liquid diet with nectar thick consistency. Therapy evaluations completed and patient is to be admitted for a comprehensive rehab program on 07/17/2019.  Complete NIHSS TOTAL: 12  Patient's medical record from [facility] has been reviewed by the rehabilitation admission coordinator and physician.  Past Medical History      Past Medical History:  Diagnosis Date   CAD (coronary artery disease)    a. s/p NSTEMI in 04/2018 with DES to LCx.    Hypertension    Myocardial infarction ([place])    Pneumonia    Stroke [facility])     Family History    family history includes Alcoholism in his father; Cancer in his father and sister; Dementia in his mother; Hypertension in his father and mother; Stroke in his father and mother.  Prior Rehab/Hospitalizations Has the patient had prior rehab or hospitalizations prior to admission? No  Has the patient had major surgery during 100 days prior to admission? No              Current Medications  Current Facility-Administered Medications:    acetaminophen (TYLENOL) tablet 650 mg, 650 mg, Oral, Q4H PRN **OR** acetaminophen (TYLENOL) solution 650 mg, 650 mg, Per Tube, Q4H PRN **OR** acetaminophen (TYLENOL) suppository 650 mg, 650 mg, Rectal, Q4H PRN, Emokpae, Ejiroghene E, MD   atorvastatin (LIPITOR) tablet 40 mg, 40 mg, Oral, q1800, Emokpae, Ejiroghene E, MD, 40 mg at 07/16/19 1738   carvedilol (COREG) tablet 3.125 mg, 3.125 mg, Oral, BID WC, Swayze, Ava, DO, 3.125 mg at 07/17/19 0848   chlorhexidine (PERIDEX) 0.12 % solution 15 mL, 15 mL, Mouth Rinse, BID, Mariel Aloe, MD, 15 mL at 07/17/19 0848   clopidogrel (PLAVIX) tablet 75 mg, 75 mg, Oral, Daily, Emokpae, Ejiroghene E, MD, 75 mg at 07/17/19 0848   dextrose 5 %-0.9 % sodium chloride infusion, , Intravenous, Continuous, Rosalin Hawking, MD, Last Rate: 75 mL/hr at 07/16/19 1840   feeding supplement (ENSURE ENLIVE) (ENSURE ENLIVE) liquid 237 mL, 237 mL, Oral, TID BM, Swayze, Ava, DO, 237 mL at 07/17/19 0848   MEDLINE mouth rinse, 15 mL, Mouth Rinse, q12n4p, Mariel Aloe, MD, 15 mL at 07/16/19 1739   senna-docusate (Senokot-S) tablet 1 tablet, 1 tablet, Oral, QHS PRN, Emokpae, Ejiroghene E, MD  Patients Current Diet:     Diet Order                  Diet full liquid Room service appropriate? Yes; Fluid consistency: Nectar Thick  Diet effective now               Precautions / Restrictions Precautions Precautions: Fall Restrictions Weight Bearing Restrictions: No   Has the patient had 2 or more falls or a fall  with injury in the past year? Yes  Prior Activity Level Community (5-7x/wk): very active PTA; did not work or drive due to vision issues; was Independent PTA with use of SPC.   Prior Functional Level Self Care: Did the patient need help bathing, dressing, using the  toilet or eating? Independent  Indoor Mobility: Did the patient need assistance with walking from room to room (with or without device)? Independent  Stairs: Did the patient need assistance with internal or external stairs (with or without device)? Independent  Functional Cognition: Did the patient need help planning regular tasks such as shopping or remembering to take medications? Independent  Home Assistive Devices / Equipment Home Assistive Devices/Equipment: Environmental consultant (specify type) Home Equipment: Walker - 2 wheels, Shower seat, Bedside commode  Prior Device Use: Indicate devices/aids used by the patient prior to current illness, exacerbation or injury? single point cane (also has RW)  Current Functional Level Cognition  Arousal/Alertness: Awake/alert Overall Cognitive Status: Impaired/Different from baseline Difficult to assess due to: Impaired communication Current Attention Level: Sustained Orientation Level: Oriented X4 Following Commands: Follows one step commands inconsistently, Follows one step commands with increased time Safety/Judgement: Decreased awareness of safety, Decreased awareness of deficits General Comments: increaed time for processing; tends to use "thumbs up/down" Attention: Sustained Sustained Attention: Impaired Sustained Attention Impairment: Functional basic Memory: (continue to address ) Awareness: Impaired Awareness Impairment: Emergent impairment Problem Solving: (continue to assess in diagnostic tx) Safety/Judgment: Impaired    Extremity Assessment (includes Sensation/Coordination)  Upper Extremity Assessment: Generalized weakness RUE Deficits / Details: initiation of  movement- seen proximal movement; swelling; not functional  RUE Sensation: (most lkely impaired) RUE Coordination: decreased fine motor, decreased gross motor  Lower Extremity Assessment: Defer to PT evaluation RLE Deficits / Details: difficulty lifting LE against gravity - reduced weight shift to this LE    ADLs  Overall ADL's : Needs assistance/impaired Eating/Feeding: Set up, Minimal assistance, Sitting, Cueing for safety, Cueing for sequencing Grooming: Minimal assistance, Sitting Grooming Details (indicate cue type and reason): uses LUE Upper Body Bathing: Moderate assistance, Bed level Lower Body Bathing: Maximal assistance, Sit to/from stand Lower Body Bathing Details (indicate cue type and reason): Able to stand while theraist assisted with pericare Upper Body Dressing : Maximal assistance, Bed level Lower Body Dressing: Total assistance, Bed level Toilet Transfer: Total assistance Toilet Transfer Details (indicate cue type and reason): unable due to fatigue  Toileting- Clothing Manipulation and Hygiene: Total assistance, Bed level Functional mobility during ADLs: Moderate assistance, +2 for physical assistance, Cueing for safety, Cueing for sequencing General ADL Comments: drinking from cup, using suction and writing on board with minguardA to set-upA    Mobility  Overal bed mobility: Needs Assistance Bed Mobility: Supine to Sit Rolling: Min assist Supine to sit: Mod assist, +2 for physical assistance General bed mobility comments: assist to bring R LE/hips to EOB and to elevate trunk into sitting; cues for sequencing     Transfers  Overall transfer level: Needs assistance Equipment used: 2 person hand held assist Transfer via Lift Equipment: Stedy Transfers: Sit to/from Stand Sit to Stand: Mod assist, +2 physical assistance, Min assist, +2 safety/equipment Stand pivot transfers: Max assist, +2 physical assistance  Lateral/Scoot Transfers: Mod assist, Max assist, +2  physical assistance(block RLE due to extensor pattern) General transfer comment: pt stood X 3 trials with use of standing frame with R knee blocked for safety and R UE supported; use of gait belt to assist with power up into standing; Stedy standing frame used for transfer bed to recliner for safety    Ambulation / Gait / Stairs / Wheelchair Mobility  Ambulation/Gait General Gait Details: worked on pre gait activiites standing at [employer] with +2 assist; pt able to step forward/backward with L LE with R LE blocked for safety  Posture / Balance Balance Overall balance assessment: Needs assistance Sitting-balance support: Single extremity supported, Feet supported Sitting balance-Leahy Scale: Fair Standing balance support: Bilateral upper extremity supported, During functional activity Standing balance-Leahy Scale: Poor Standing balance comment: up to Max A for standing balance    Special needs/care consideration BiPAP/CPAP : no CPM : no Continuous Drip IV : dextrose 5%-.9% sodium chloride infusion  Dialysis : no        Days : no Life Vest : no Oxygen : no on RA Special Bed : no Trach Size : no Wound Vac (area) : no      Location : no Skin : no areas of concern                     Bowel mgmt: incontinent, last BM: 7/28 Bladder mgmt: incontinent, external catheter in place Diabetic mgmt: no Behavioral consideration : communicates best through writing due to aphasia  Chemo/radiation : no   Previous Home Environment (from acute therapy documentation) Living Arrangements: Spouse/significant other Available Help at Discharge: Available 24 hours/day Type of Home: House Home Layout: Two level, Able to live on main level with bedroom/bathroom Home Access: Stairs to enter Entrance Stairs-Rails: Right Entrance Stairs-Number of Steps: 2 Bathroom Shower/Tub: Multimedia programmer: Wise: No  Discharge Living Setting Plans for Discharge Living  Setting: Patient's home, Lives with (comment)(fiance [name]) Type of Home at Discharge: House Discharge Home Layout: Two level Alternate Level Stairs-Rails: None Alternate Level Stairs-Number of Steps: NA pt does not need to go upstairs; can live on first floor Discharge Home Access: Stairs to enter Entrance Stairs-Rails: None Entrance Stairs-Number of Steps: 2-3 steps plus threshold; fiance aware of need for ramp  Discharge Bathroom Shower/Tub: Tub/shower unit Discharge Bathroom Toilet: Standard Discharge Bathroom Accessibility: Yes How Accessible: Accessible via walker Does the patient have any problems obtaining your medications?: Yes (Describe)(pt uninsured)  Social/Family/Support Systems Patient Roles: Partner, Caregiver(assists with [name]'s 6 kids (4/6 with disabilities)) Contact Information: fiance [name]: [phone]; Niece Delana Meyer: [phone]; Trish Fountain [phone]) Anticipated Caregiver: [name] Anticipated Caregiver's Contact Information: see above Ability/Limitations of Caregiver: Mod A Caregiver Availability: 24/7 Discharge Plan Discussed with Primary Caregiver: Yes Is Caregiver In Agreement with Plan?: Yes Does Caregiver/Family have Issues with Lodging/Transportation while Pt is in Rehab?: Yes  Goals/Additional Needs Patient/Family Goal for Rehab: PT/OT: Mod A; SLP: Min A Expected length of stay: 16-20 days Cultural Considerations: NA Dietary Needs: full liquid diet, nectar thick Equipment Needs: TBD Pt/Family Agrees to Admission and willing to participate: Yes Program Orientation Provided & Reviewed with Pt/Caregiver Including Roles  & Responsibilities: Yes(reviewed with [name], sister Margorie John)  Barriers to Discharge: Home environment access/layout, Incontinence, Lack of/limited family support, Insurance for SNF coverage, Nutrition means  Barriers to Discharge Comments: steps to enter; assist level available; on tickened liquids currently (may  be difficult to afford)  Decrease burden of Care through IP rehab admission: NA  Possible need for SNF placement upon discharge: Not anticipated; pt is uninsured and the plan (as understood by pt, fiance, and family at the time of admit to [facility]) is for pt to return home at end of CIR stay. Fiance is able to care for pt with 24/7 A if pt able to transfer with one person. She is willing to be trained and come in for education as needed.   Patient Condition: I have reviewed medical records from [facility], spoken with CM, and patient, his fiance and his  niece and sister. I met with patient at the bedside for inpatient rehabilitation assessment.  Patient will benefit from ongoing PT, OT and SLP, can actively participate in 3 hours of therapy a day 5 days of the week, and can make measurable gains during the admission.  Patient will also benefit from the coordinated team approach during an Inpatient Acute Rehabilitation admission.  The patient will receive intensive therapy as well as Rehabilitation physician, nursing, social worker, and care management interventions.  Due to bladder management, bowel management, safety, skin/wound care, disease management, medication administration, pain management and patient education the patient requires 24 hour a day rehabilitation nursing.  The patient is currently Min/Mod A x2 for sit to stand transfers and Max A x2 for stand pivot transfers and Min A for feeding and grooming basic ADLs.  Discharge setting and therapy post discharge at home with home health is anticipated.  Patient has agreed to participate in the Acute Inpatient Rehabilitation Program and will admit today.  Preadmission Screen Completed By:  Jhonnie Garner, 07/17/2019 10:33 AM ______________________________________________________________________   Discussed status with Dr. Naaman Plummer on 07/17/2019 at 10:33AM and received approval for admission today.  Admission Coordinator:  Jhonnie Garner,  OT, time 10:33AM/Date 07/17/2019   Assessment/Plan: Diagnosis: left MCA and ACA infarct 1. Does the need for close, 24 hr/day Medical supervision in concert with the patient's rehab needs make it unreasonable for this patient to be served in a less intensive setting? Yes 2. Co-Morbidities requiring supervision/potential complications: CAD, CKD 3. Due to bladder management, bowel management, safety, skin/wound care, disease management, medication administration, pain management and patient education, does the patient require 24 hr/day rehab nursing? Yes 4. Does the patient require coordinated care of a physician, rehab nurse, PT (1-2 hrs/day, 5 days/week), OT (1-2 hrs/day, 5 days/week) and SLP (1-2 hrs/day, 5 days/week) to address physical and functional deficits in the context of the above medical diagnosis(es)? Yes Addressing deficits in the following areas: balance, endurance, locomotion, strength, transferring, bowel/bladder control, bathing, dressing, feeding, grooming, toileting, cognition, speech and psychosocial support 5. Can the patient actively participate in an intensive therapy program of at least 3 hrs of therapy 5 days a week? Yes 6. The potential for patient to make measurable gains while on inpatient rehab is excellent 7. Anticipated functional outcomes upon discharge from inpatients are: mod assist PT, mod assist OT, min assist SLP 8. Estimated rehab length of stay to reach the above functional goals is: 16-20 days 9. Anticipated D/C setting: Home 10. Anticipated post D/C treatments: [place] therapy 11. Overall Rehab/Functional Prognosis: excellent  MD Signature: Zachary Staggers, MD, [facility] 07/17/2019         Revision History Date/Time User Provider Type Action  07/17/2019 10:52 AM Zachary Staggers, MD Physician Sign  07/17/2019 10:42 AM Jhonnie Garner, OT Rehab Admission Coordinator Share  View Details Report

## 2019-07-17 NOTE — Progress Notes (Signed)
Inpatient Rehabilitation-Admissions Coordinator   [facility] met with pt at the bedside. He is still in agreement with pursuing CIR. [facility] has a bed available and can admit this patient today. [facility] has received medical clearance by Dr. Avon Gully to admit pt to CIR. AC has notified pt's niece and fiance of planned admission today.   RN, SW/CM will be updated on plan.   Jhonnie Garner, OTR/L  Rehab Admissions Coordinator  [phone] 07/17/2019 10:18 AM

## 2019-07-17 NOTE — Progress Notes (Signed)
Patient admitted to 4W04 from 3W. Patient oriented to rehab medications, rehab schedule and plan of care was reviewed with understanding, patient is mute but used hand gestures to communicate understanding.

## 2019-07-17 NOTE — Discharge Summary (Signed)
Physician Discharge Summary  Zachary Mccann ZOX:[id number] DOB: 1971/09/22 DOA: 07/10/2019  PCP: [name], NP  Admit date: 07/10/2019 Discharge date: 07/17/2019  Admitted From: Home Disposition: CIR  Recommendations for Outpatient Follow-up:  1. Follow up with PCP in 1-2 weeks 2. Please obtain BMP/CBC in one week  Discharge Condition: Stable CODE STATUS: Full Diet recommendation: As tolerated, speech currently recommending: Dysphagia 1 (puree);Nectar-thick liquid Liquids provided via: Teaspoon;Cup Medication Administration: Crushed with puree Supervision: Patient able to self feed;Staff to assist with self feeding Compensations: Small sips/bites;Other (Comment)(head tilt backwards, spoon delivery left side of mouth) Postural Changes and/or Swallow Maneuvers: Seated upright 90 degrees  Brief/Interim Summary: Zachary Mccann a 48 y.o.male with medical history significant forCVA, hypertension, CKD 3, coronary artery disease. Patient presented secondary to slurred speech, facial droop and lethargy and presents with symptoms likely related to acute stroke.  Patient admitted as above with slurred speech and facial droop lethargy noted to have left MCA and ACA scattered infarct with left ICA occlusion and large vessel disease.  Continue Plavix, statin, unfortunately patient allergic to aspirin per previous documentation.  She continues to have difficulty with dysphasia, on modified diet as above, continues to be evaluated and treated by PT, OT, speech with CIR.  Ultimate disposition thereafter is tentatively home ending clinical improvement with therapy.  At discharge patient will be continued on carvedilol, Plavix, atorvastatin.  Patient will need follow-up with neurology and PCP as scheduled likely within the next 3 to 4 weeks.   Discharge Diagnoses:  Principal Problem:   Acute CVA (cerebrovascular accident) (HCC) Active Problems:   Essential hypertension   CKD (chronic kidney  disease), stage III (HCC) = Secondary to Hypertension   History of non-ST elevation myocardial infarction (NSTEMI)   Cardiomyopathy (HCC)   Slurred speech   Cerebral embolism with cerebral infarction   Discharge Instructions    Ambulatory referral to Neurology   Complete by: As directed    Follow up with stroke clinic NP (Jessica Vanschaick or Darrol Angel, if both not available, consider Manson Allan, or Ahern) at [facility] in about 4 weeks. Thanks.   Diet - low sodium heart healthy   Complete by: As directed    Increase activity slowly   Complete by: As directed      Allergies as of 07/17/2019      Reactions   Aspirin Swelling      Medication List    TAKE these medications   atorvastatin 40 MG tablet Commonly known as: LIPITOR Take 1 tablet (40 mg total) by mouth daily at 6 PM.   carvedilol 3.125 MG tablet Commonly known as: COREG Take 3.125 mg by mouth 2 (two) times daily with a meal.   clopidogrel 75 MG tablet Commonly known as: PLAVIX Take 1 tablet (75 mg total) by mouth daily. Start taking on: July 18, 2019   feeding supplement (ENSURE ENLIVE) Liqd Take 237 mLs by mouth 3 (three) times daily between meals.      Follow-up Information    [facility]. Schedule an appointment as soon as possible for a visit in 4 week(s).   Specialty: Neurology Contact information: [address] [place] [address] [phone]         Allergies  Allergen Reactions  . Aspirin Swelling    Consultations:  Neurology  Procedures/Studies: Ct Head Wo Contrast  Result Date: 07/10/2019 CLINICAL DATA:  Slurred speech and loss of balance EXAM: CT HEAD WITHOUT CONTRAST TECHNIQUE: Contiguous axial images  were obtained from the base of the skull through the vertex without intravenous contrast. COMPARISON:  09/08/2018 FINDINGS: Brain: Wedge-shaped area decreased attenuation is noted in the superior aspect of the left cerebellum  consistent with prior infarct. This corresponds to an area of prior ischemia on the previous exam. There are scattered areas of decreased attenuation in the periventricular white matter consistent with prior lacunar infarcts. These have increased in the interval from the prior exam. No findings to suggest acute infarct or acute hemorrhage are seen. Vascular: No hyperdense vessel or unexpected calcification. Skull: Normal. Negative for fracture or focal lesion. Sinuses/Orbits: No acute finding. Other: None. IMPRESSION: Area of prior infarction in the superior aspect of the left cerebellum. Areas of prior ischemia are noted. The changes on the left are new from the prior exam but consistent with more subacute to chronic ischemia. No evidence of acute hemorrhage noted. Electronically Signed   By: Alcide CleverMark  Lukens M.D.   On: 07/10/2019 17:26   [name]  Result Date: 07/11/2019 CLINICAL DATA:  Altered mental status. Left facial droop. Right-sided weakness. Symptoms for 2 days. EXAM: MRI HEAD WITHOUT CONTRAST MRA HEAD WITHOUT CONTRAST TECHNIQUE: Multiplanar, multiecho pulse sequences of the brain and surrounding structures were obtained without intravenous contrast. Angiographic images of the head were obtained using MRA technique without contrast. COMPARISON:  CT head without contrast 07/10/2019. CTA of the head and neck I/23/19. MRI brain and MRA head 09/07/2018. FINDINGS: MRI HEAD FINDINGS Brain: The diffusion-weighted images demonstrate scattered areas of acute nonhemorrhagic infarct within the right frontal and parietal white matter. There is involvement of the left centrum semi ovale and corona radiata extending anteriorly into border zone regions. Additional punctate cortical areas are present in the left parietal lobe and along the cingulate gyrus. There is no significant involvement of the right hemisphere. There is T2 signal changes associated with the areas of acute/subacute infarction. Remote  lacunar infarct of the right centrum semi ovale is noted. Periventricular white matter changes are present on the right as well. The brainstem is normal. Remote left cerebellar infarct is again seen. Cerebellum is otherwise within normal limits. The internal auditory canals are within normal limits. Vascular: Abnormal signal is present within the vertebral arteries at the foramen magnum bilaterally. There is flow in the distal basilar artery. There is no flow in the left internal carotid artery. Skull and upper cervical spine: Mild degenerative changes are present the upper cervical spine. Skull base is normal. Midline structures are otherwise within normal limits. Sinuses/Orbits: The paranasal sinuses and mastoid air cells are clear. The globes and orbits are within normal limits. MRA HEAD FINDINGS The right internal carotid artery is within normal limits from the high cervical segments through the ICA termini. A prominent posterior communicating artery is patent. The left internal carotid artery is occluded proximally. Flow is reconstituted in the distal left ICA at the level of the posterior communicating artery. A1 and M1 segments are normal. There is reduced size of left MCA branch vessels compared to the right without a significant focal stenosis or occlusion. There is signal loss in the V4 segments of the vertebral arteries bilaterally, more proximally on the left. There is no flow signal within the proximal basilar artery. There is flow in the distal basilar artery. A prominent right posterior communicating artery is present. Moderate narrowing is present in the left P2 segment. There is mild irregularity of distal PCA branch vessels without a focal stenosis or occlusion. IMPRESSION: 1. Occluded  left internal carotid artery with reconstitution of the level of the left posterior communicating artery. Of note, there is narrowing within the left posterior communicating artery. 2. No significant stenosis or  occlusion of the right internal carotid artery with a prominent right posterior communicating artery that is feeding the posterior circulation. 3. High-grade stenosis or occlusion of distal vertebral arteries bilaterally and of the proximal basilar artery. Flow is reconstituted in the distal basilar artery, presumably through the right posterior communicating artery. 4. Asymmetric attenuation of left MCA branch vessels without a focal stenosis or protrusion. This likely to represents the proximal occlusion decreased perfusion pressure due. 5. Extensive left hemisphere acute/subacute white matter infarcts involving the left centrum semi ovale and corona radiata as well as border zone areas. This corresponds with the proximal left ICA occlusion. 6. There are scattered punctate foci of cortical infarct involving the anterior left frontal lobe and left parietal lobe. There is also some involvement of the cingulate gyrus. Electronically Signed   By: Marin Roberts M.D.   On: 07/11/2019 16:03   [name]  Result Date: 07/11/2019 CLINICAL DATA:  Altered mental status. Left facial droop. Right-sided weakness. Symptoms for 2 days. EXAM: MRI HEAD WITHOUT CONTRAST MRA HEAD WITHOUT CONTRAST TECHNIQUE: Multiplanar, multiecho pulse sequences of the brain and surrounding structures were obtained without intravenous contrast. Angiographic images of the head were obtained using MRA technique without contrast. COMPARISON:  CT head without contrast 07/10/2019. CTA of the head and neck I/23/19. MRI brain and MRA head 09/07/2018. FINDINGS: MRI HEAD FINDINGS Brain: The diffusion-weighted images demonstrate scattered areas of acute nonhemorrhagic infarct within the right frontal and parietal white matter. There is involvement of the left centrum semi ovale and corona radiata extending anteriorly into border zone regions. Additional punctate cortical areas are present in the left parietal lobe and along the cingulate  gyrus. There is no significant involvement of the right hemisphere. There is T2 signal changes associated with the areas of acute/subacute infarction. Remote lacunar infarct of the right centrum semi ovale is noted. Periventricular white matter changes are present on the right as well. The brainstem is normal. Remote left cerebellar infarct is again seen. Cerebellum is otherwise within normal limits. The internal auditory canals are within normal limits. Vascular: Abnormal signal is present within the vertebral arteries at the foramen magnum bilaterally. There is flow in the distal basilar artery. There is no flow in the left internal carotid artery. Skull and upper cervical spine: Mild degenerative changes are present the upper cervical spine. Skull base is normal. Midline structures are otherwise within normal limits. Sinuses/Orbits: The paranasal sinuses and mastoid air cells are clear. The globes and orbits are within normal limits. MRA HEAD FINDINGS The right internal carotid artery is within normal limits from the high cervical segments through the ICA termini. A prominent posterior communicating artery is patent. The left internal carotid artery is occluded proximally. Flow is reconstituted in the distal left ICA at the level of the posterior communicating artery. A1 and M1 segments are normal. There is reduced size of left MCA branch vessels compared to the right without a significant focal stenosis or occlusion. There is signal loss in the V4 segments of the vertebral arteries bilaterally, more proximally on the left. There is no flow signal within the proximal basilar artery. There is flow in the distal basilar artery. A prominent right posterior communicating artery is present. Moderate narrowing is present in the left P2 segment. There is mild irregularity of  distal PCA branch vessels without a focal stenosis or occlusion. IMPRESSION: 1. Occluded left internal carotid artery with reconstitution of the  level of the left posterior communicating artery. Of note, there is narrowing within the left posterior communicating artery. 2. No significant stenosis or occlusion of the right internal carotid artery with a prominent right posterior communicating artery that is feeding the posterior circulation. 3. High-grade stenosis or occlusion of distal vertebral arteries bilaterally and of the proximal basilar artery. Flow is reconstituted in the distal basilar artery, presumably through the right posterior communicating artery. 4. Asymmetric attenuation of left MCA branch vessels without a focal stenosis or protrusion. This likely to represents the proximal occlusion decreased perfusion pressure due. 5. Extensive left hemisphere acute/subacute white matter infarcts involving the left centrum semi ovale and corona radiata as well as border zone areas. This corresponds with the proximal left ICA occlusion. 6. There are scattered punctate foci of cortical infarct involving the anterior left frontal lobe and left parietal lobe. There is also some involvement of the cingulate gyrus. Electronically Signed   By: Marin Roberts M.D.   On: 07/11/2019 16:03   Dg Swallowing Func-speech Pathology  Result Date: 07/14/2019 Objective Swallowing Evaluation: Type of Study: MBS-Modified Barium Swallow Study  Patient Details Name: Zachary Mccann MRN: [id number] Date of Birth: [birth year] Today's Date: 07/14/2019 Time: SLP Start Time (ACUTE ONLY): 0915 -SLP Stop Time (ACUTE ONLY): 1000 SLP Time Calculation (min) (ACUTE ONLY): 45 min Past Medical History: Past Medical History: Diagnosis Date . CAD (coronary artery disease)   a. s/p NSTEMI in 04/2018 with DES to LCx.  [name] Hypertension  . Myocardial infarction (HCC)  . Pneumonia  . Stroke [facility])  Past Surgical History: Past Surgical History: Procedure Laterality Date . CORONARY STENT INTERVENTION N/A 05/05/2018  Procedure: CORONARY STENT INTERVENTION;  Surgeon: Marykay Lex, MD;  Location:  [facility] INVASIVE CV LAB;  Service: Cardiovascular;  Laterality: N/A; . LEFT HEART CATH AND CORONARY ANGIOGRAPHY N/A 05/05/2018  Procedure: LEFT HEART CATH AND CORONARY ANGIOGRAPHY;  Surgeon: Marykay Lex, MD;  Location: [facility] INVASIVE CV LAB;  Service: Cardiovascular;  Laterality: N/A; . TEE WITHOUT CARDIOVERSION N/A 09/08/2018  Procedure: TRANSESOPHAGEAL ECHOCARDIOGRAM (TEE);  Surgeon: Laurey Morale, MD;  Location: [facility] ENDOSCOPY;  Service: Cardiovascular;  Laterality: N/A; HPI: Patient is a 48 y/o male presenting to the ED on 07/10/2019 with primary complaints of slurred speech, R facial droop. MRI extensive left hemisphere acute/subacute white matter infarcts Extensive left hemisphere acute/subacute white matter infarcts involving the left centrum semi ovale and corona radiata. Scattered punctate foci of cortical infarct involving the anterior left frontal lobe and left parietal lobe. Occluded left internal carotid artery with reconstitution of the level of the left posterior communicating artery.  Past medical history significant for CVA, hypertension, CKD 3, coronary artery disease.  Subjective: alert, cooperative Assessment / Plan / Recommendation CHL IP CLINICAL IMPRESSIONS 07/14/2019 Clinical Impression Pt presents with a primary severe oral dysphagia secondary to apraxia and focal CN weakness.  There are significant deficits in oral control and sequencing of motor movements, as well as sensory loss on right side.  Pt unable to coordinate use of a straw; boluses were offered from cup edge and spoon.  Liquid barium tends to spill immediately from right side of mouth.  There is limited lingual movement, requiring pt to extend neck in order to allow gravity to help propel material into pharynx.  Liquids tend to move passively into throat; purees are moved with weak tongue  pumping.  However, once material reaches sub-vallecular space, pt achieves effective laryngeal vestibule closure and transitions POs through UES  with no residue post-swallow.  Due to deficits in timing, there were two occasions when thin liquids entered the larynx before the onset of the swallow, leading to trace aspiration with a delayed cough response.  At this time, recommend initiating a full liquid diet, thickened to a nectar consistency.  Pt will need 1:1 assistance to eat safely, with cues to tilt head back and assistance to prevent oral leakage.  He will benefit from a calorie count, as primary issue will be consumption of adequate calories.  I have spoken with Elijah Birk, RD, who is following Mr. Skalicky today.  SLP will follow for safety, therapeutic exercise, and diet advancement as warranted.  SLP Visit Diagnosis Dysphagia, oral phase (R13.11) Attention and concentration deficit following -- Frontal lobe and executive function deficit following -- Impact on safety and function Moderate aspiration risk   CHL IP TREATMENT RECOMMENDATION 07/14/2019 Treatment Recommendations Therapy as outlined in treatment plan below   Prognosis 07/14/2019 Prognosis for Safe Diet Advancement Good Barriers to Reach Goals Severity of deficits Barriers/Prognosis Comment -- CHL IP DIET RECOMMENDATION 07/14/2019 SLP Diet Recommendations Nectar thick liquid Liquid Administration via Cup;Spoon Medication Administration Crushed with puree Compensations Minimize environmental distractions;Small sips/bites;Monitor for anterior loss Postural Changes --   CHL IP OTHER RECOMMENDATIONS 07/14/2019 Recommended Consults -- Oral Care Recommendations Oral care BID Other Recommendations Order thickener from pharmacy   CHL IP FOLLOW UP RECOMMENDATIONS 07/14/2019 Follow up Recommendations Inpatient Rehab   CHL IP FREQUENCY AND DURATION 07/14/2019 Speech Therapy Frequency (ACUTE ONLY) min 3x week Treatment Duration --      CHL IP ORAL PHASE 07/14/2019 Oral Phase Impaired Oral - Pudding Teaspoon -- Oral - Pudding Cup -- Oral - Honey Teaspoon -- Oral - Honey Cup -- Oral - Nectar Teaspoon -- Oral -  Nectar Cup Right anterior bolus loss;Weak lingual manipulation;Lingual pumping;Incomplete tongue to palate contact;Reduced posterior propulsion;Right pocketing in lateral sulci;Lingual/palatal residue;Pocketing in anterior sulcus;Delayed oral transit;Decreased bolus cohesion;Left anterior bolus loss Oral - Nectar Straw -- Oral - Thin Teaspoon -- Oral - Thin Cup Right anterior bolus loss;Weak lingual manipulation;Lingual pumping;Incomplete tongue to palate contact;Reduced posterior propulsion;Right pocketing in lateral sulci;Lingual/palatal residue;Pocketing in anterior sulcus;Delayed oral transit;Decreased bolus cohesion;Left anterior bolus loss Oral - Thin Straw -- Oral - Puree Right anterior bolus loss;Weak lingual manipulation;Lingual pumping;Incomplete tongue to palate contact;Reduced posterior propulsion;Right pocketing in lateral sulci;Lingual/palatal residue;Pocketing in anterior sulcus;Delayed oral transit;Decreased bolus cohesion;Left anterior bolus loss Oral - Mech Soft -- Oral - Regular -- Oral - Multi-Consistency -- Oral - Pill -- Oral Phase - Comment --  CHL IP PHARYNGEAL PHASE 07/14/2019 Pharyngeal Phase Impaired Pharyngeal- Pudding Teaspoon -- Pharyngeal -- Pharyngeal- Pudding Cup -- Pharyngeal -- Pharyngeal- Honey Teaspoon -- Pharyngeal -- Pharyngeal- Honey Cup -- Pharyngeal -- Pharyngeal- Nectar Teaspoon -- Pharyngeal -- Pharyngeal- Nectar Cup Delayed swallow initiation-vallecula;Delayed swallow initiation-pyriform sinuses;Penetration/Aspiration before swallow;Trace aspiration Pharyngeal Material enters airway, passes BELOW cords and not ejected out despite cough attempt by patient Pharyngeal- Nectar Straw -- Pharyngeal -- Pharyngeal- Thin Teaspoon -- Pharyngeal -- Pharyngeal- Thin Cup Delayed swallow initiation-pyriform sinuses;Trace aspiration;Penetration/Aspiration before swallow Pharyngeal Material enters airway, passes BELOW cords and not ejected out despite cough attempt by patient Pharyngeal-  Thin Straw -- Pharyngeal -- Pharyngeal- Puree Delayed swallow initiation-vallecula;Delayed swallow initiation-pyriform sinuses Pharyngeal -- Pharyngeal- Mechanical Soft -- Pharyngeal -- Pharyngeal- Regular -- Pharyngeal -- Pharyngeal- Multi-consistency -- Pharyngeal -- Pharyngeal- Pill -- Pharyngeal --  Pharyngeal Comment --  No flowsheet data found. Blenda MountsCouture, Amanda Laurice 07/14/2019, 11:23 AM              [place] Carotid  Result Date: 07/13/2019 Carotid Arterial Duplex Study Indications:       CVA, Speech disturbance, Weakness and Patient had CTA of neck                    08/2018 that showed bilateral patent ICAs. Risk Factors:      Hypertension, prior MI, coronary artery disease. Other Factors:     CVA 08/2018. Comparison Study:  No prior study on file for comparison. Performing Technologist: Sherren Kernsandace Kanady RVS  Examination Guidelines: A complete evaluation includes B-mode imaging, spectral Doppler, color Doppler, and power Doppler as needed of all accessible portions of each vessel. Bilateral testing is considered an integral part of a complete examination. Limited examinations for reoccurring indications may be performed as noted.  Right Carotid Findings: +----------+--------+--------+--------+-----------+------------------+           PSV cm/sEDV cm/sStenosisDescribe   Comments           +----------+--------+--------+--------+-----------+------------------+ CCA Prox  109     23                         intimal thickening +----------+--------+--------+--------+-----------+------------------+ CCA Distal101     26                         intimal thickening +----------+--------+--------+--------+-----------+------------------+ ICA Prox  74      35              homogeneous                   +----------+--------+--------+--------+-----------+------------------+ ICA Distal73      33                                             +----------+--------+--------+--------+-----------+------------------+ ECA       75      13                                            +----------+--------+--------+--------+-----------+------------------+ +----------+--------+-------+--------+-------------------+           PSV cm/sEDV cmsDescribeArm Pressure (mmHG) +----------+--------+-------+--------+-------------------+ [id number]                                         +----------+--------+-------+--------+-------------------+ +---------+--------+--+--------+-+ VertebralPSV cm/s26EDV cm/s9 +---------+--------+--+--------+-+  Left Carotid Findings: +----------+--------+--------+--------+--------+-----------------------+           PSV cm/sEDV cm/sStenosisDescribeComments                +----------+--------+--------+--------+--------+-----------------------+ CCA Prox  82      14                      intimal thickening      +----------+--------+--------+--------+--------+-----------------------+ CCA Distal62      17                      intimal thickening      +----------+--------+--------+--------+--------+-----------------------+ ICA Prox  Occluded        soft plaque vs thrombus +----------+--------+--------+--------+--------+-----------------------+ ECA       139     26                                              +----------+--------+--------+--------+--------+-----------------------+ +----------+--------+--------+--------+-------------------+ SubclavianPSV cm/sEDV cm/sDescribeArm Pressure (mmHG) +----------+--------+--------+--------+-------------------+           121                                         +----------+--------+--------+--------+-------------------+ +---------+--------+--+--------+-+ VertebralPSV cm/s55EDV cm/s4 +---------+--------+--+--------+-+  Summary: Right Carotid: The extracranial vessels were near-normal with only minimal wall                 thickening or plaque. Left Carotid: The CCA appears occluded. Vertebrals:  Bilateral vertebral arteries demonstrate antegrade flow. Subclavians: Normal flow hemodynamics were seen in bilateral subclavian              arteries. *See table(s) above for measurements and observations.  Electronically signed by Delia Heady MD on 07/13/2019 at 8:25:55 AM.    Final     Subjective: Patient remains essentially nonverbal at this point using hand gestures for majority of our conversation.  Clines chest pain, shortness of breath, nausea, vomiting, diarrhea, constipation, headache, fevers, chills.  Indicates that he feels some improvement in symptoms but only slightly.  Discharge Exam: Vitals:   07/17/19 0356 07/17/19 0804  BP: 129/82 140/77  Pulse: 82 69  Resp: 17 12  Temp: 98.6 F (37 C) 98.9 F (37.2 C)  SpO2: 97% 98%   Vitals:   07/16/19 2308 07/17/19 0000 07/17/19 0356 07/17/19 0804  BP: (!) 181/99 (!) 146/94 129/82 140/77  Pulse: 72  82 69  Resp: 16  17 12   Temp: 99.1 F (37.3 C)  98.6 F (37 C) 98.9 F (37.2 C)  TempSrc: Oral  Oral Oral  SpO2: 100%  97% 98%  Weight:      Height:        General:  Pleasantly resting in bed, No acute distress. HEENT:  Normocephalic atraumatic.  Sclerae nonicteric, noninjected.  Extraocular movements intact bilaterally. Neck:  Without mass or deformity.  Trachea is midline. Lungs:  Clear to auscultate bilaterally without rhonchi, wheeze, or rales. Heart:  Regular rate and rhythm.  Without murmurs, rubs, or gallops. Abdomen:  Soft, nontender, nondistended.  Without guarding or rebound. Extremities: Without cyanosis, clubbing, edema, or obvious deformity. Vascular:  Dorsalis pedis and posterior tibial pulses palpable bilaterally. Skin:  Warm and dry, no erythema, no ulcerations.  The results of significant diagnostics from this hospitalization (including imaging, microbiology, ancillary and laboratory) are listed below for reference.     Microbiology: Recent Results (from the past 240 hour(s))  SARS Coronavirus 2 (CEPHEID - Performed in [facility] hospital lab), Hosp Order     Status: None   Collection Time: 07/10/19  5:25 PM   Specimen: Nasopharyngeal Swab  Result Value Ref Range Status   SARS Coronavirus 2 NEGATIVE NEGATIVE Final    Comment: (NOTE) If result is NEGATIVE SARS-CoV-2 target nucleic acids are NOT DETECTED. The SARS-CoV-2 RNA is generally detectable in upper and lower  respiratory specimens during the acute phase of infection. The lowest  concentration of SARS-CoV-2 viral copies this  assay can detect is 250  copies / mL. A negative result does not preclude SARS-CoV-2 infection  and should not be used as the sole basis for treatment or other  patient management decisions.  A negative result may occur with  improper specimen collection / handling, submission of specimen other  than nasopharyngeal swab, presence of viral mutation(s) within the  areas targeted by this assay, and inadequate number of viral copies  (<250 copies / mL). A negative result must be combined with clinical  observations, patient history, and epidemiological information. If result is POSITIVE SARS-CoV-2 target nucleic acids are DETECTED. The SARS-CoV-2 RNA is generally detectable in upper and lower  respiratory specimens dur ing the acute phase of infection.  Positive  results are indicative of active infection with SARS-CoV-2.  Clinical  correlation with patient history and other diagnostic information is  necessary to determine patient infection status.  Positive results do  not rule out bacterial infection or co-infection with other viruses. If result is PRESUMPTIVE POSTIVE SARS-CoV-2 nucleic acids MAY BE PRESENT.   A presumptive positive result was obtained on the submitted specimen  and confirmed on repeat testing.  While 2019 novel coronavirus  (SARS-CoV-2) nucleic acids may be present in the submitted sample  additional  confirmatory testing may be necessary for epidemiological  and / or clinical management purposes  to differentiate between  SARS-CoV-2 and other Sarbecovirus currently known to infect humans.  If clinically indicated additional testing with an alternate test  methodology [phone]) is advised. The SARS-CoV-2 RNA is generally  detectable in upper and lower respiratory sp ecimens during the acute  phase of infection. The expected result is Negative. Fact Sheet for Patients:  StrictlyIdeas.no Fact Sheet for Healthcare Providers: BankingDealers.co.za This test is not yet approved or cleared by the [place] FDA and has been authorized for detection and/or diagnosis of SARS-CoV-2 by FDA under an Emergency Use Authorization (EUA).  This EUA will remain in effect (meaning this test can be used) for the duration of the COVID-19 declaration under Section 564(b)(1) of the Act, 21 U.S.C. section 360bbb-3(b)(1), unless the authorization is terminated or revoked sooner. Performed at [facility], [address] [place], [place] [address]      Labs: Basic Metabolic Panel: Recent Labs  Lab 07/11/19 1649 07/12/19 0443 07/13/19 0537 07/14/19 0419 07/16/19 0412  NA 138 137 139 138 142  K 3.8 4.0 4.0 3.7 4.0  CL 106 107 109 109 111  CO2 22 21* 20* 19* 22  GLUCOSE 116* 98 106* 109* 102*  BUN 23* 19 17 13 14   CREATININE 1.80* 1.62* 1.56* 1.53* 1.63*  CALCIUM 9.0 8.9 8.9 9.0 9.3   Liver Function Tests: Recent Labs  Lab 07/10/19 1704  AST 13*  ALT 10  ALKPHOS 71  BILITOT 0.8  PROT 9.2*  ALBUMIN 3.9   No results for input(s): LIPASE, AMYLASE in the last 168 hours. No results for input(s): AMMONIA in the last 168 hours. CBC: Recent Labs  Lab 07/10/19 1704 07/16/19 0412  WBC 8.8 9.1  NEUTROABS 5.9 6.2  HGB 14.3 13.2  HCT 42.5 39.9  MCV 94.4 96.1  PLT 240 225   Cardiac Enzymes: No results for input(s): CKTOTAL, CKMB, CKMBINDEX,  TROPONINI in the last 168 hours. BNP: Invalid input(s): POCBNP CBG: Recent Labs  Lab 07/13/19 0505  GLUCAP 97   D-Dimer No results for input(s): DDIMER in the last 72 hours. Hgb A1c No results for input(s): HGBA1C in the last 72 hours. Lipid  Profile No results for input(s): CHOL, HDL, LDLCALC, TRIG, CHOLHDL, LDLDIRECT in the last 72 hours. Thyroid function studies No results for input(s): TSH, T4TOTAL, T3FREE, THYROIDAB in the last 72 hours.  Invalid input(s): FREET3 Anemia work up No results for input(s): VITAMINB12, FOLATE, FERRITIN, TIBC, IRON, RETICCTPCT in the last 72 hours. Urinalysis    Component Value Date/Time   COLORURINE YELLOW 07/10/2019 2250   APPEARANCEUR HAZY (A) 07/10/2019 2250   LABSPEC 1.025 07/10/2019 2250   PHURINE 5.0 07/10/2019 2250   GLUCOSEU NEGATIVE 07/10/2019 2250   HGBUR SMALL (A) 07/10/2019 2250   BILIRUBINUR NEGATIVE 07/10/2019 2250   KETONESUR NEGATIVE 07/10/2019 2250   PROTEINUR 100 (A) 07/10/2019 2250   NITRITE NEGATIVE 07/10/2019 2250   LEUKOCYTESUR NEGATIVE 07/10/2019 2250   Sepsis Labs Invalid input(s): PROCALCITONIN,  WBC,  LACTICIDVEN Microbiology Recent Results (from the past 240 hour(s))  SARS Coronavirus 2 (CEPHEID - [facility]), Hosp Order     Status: None   Collection Time: 07/10/19  5:25 PM   Specimen: Nasopharyngeal Swab  Result Value Ref Range Status   SARS Coronavirus 2 NEGATIVE NEGATIVE Final    Comment: (NOTE) If result is NEGATIVE SARS-CoV-2 target nucleic acids are NOT DETECTED. The SARS-CoV-2 RNA is generally detectable in upper and lower  respiratory specimens during the acute phase of infection. The lowest  concentration of SARS-CoV-2 viral copies this assay can detect is 250  copies / mL. A negative result does not preclude SARS-CoV-2 infection  and should not be used as the sole basis for treatment or other  patient management decisions.  A negative result may occur with  improper  specimen collection / handling, submission of specimen other  than nasopharyngeal swab, presence of viral mutation(s) within the  areas targeted by this assay, and inadequate number of viral copies  (<250 copies / mL). A negative result must be combined with clinical  observations, patient history, and epidemiological information. If result is POSITIVE SARS-CoV-2 target nucleic acids are DETECTED. The SARS-CoV-2 RNA is generally detectable in upper and lower  respiratory specimens dur ing the acute phase of infection.  Positive  results are indicative of active infection with SARS-CoV-2.  Clinical  correlation with patient history and other diagnostic information is  necessary to determine patient infection status.  Positive results do  not rule out bacterial infection or co-infection with other viruses. If result is PRESUMPTIVE POSTIVE SARS-CoV-2 nucleic acids MAY BE PRESENT.   A presumptive positive result was obtained on the submitted specimen  and confirmed on repeat testing.  While 2019 novel coronavirus  (SARS-CoV-2) nucleic acids may be present in the submitted sample  additional confirmatory testing may be necessary for epidemiological  and / or clinical management purposes  to differentiate between  SARS-CoV-2 and other Sarbecovirus currently known to infect humans.  If clinically indicated additional testing with an alternate test  methodology [phone]) is advised. The SARS-CoV-2 RNA is generally  detectable in upper and lower respiratory sp ecimens during the acute  phase of infection. The expected result is Negative. Fact Sheet for Patients:  BoilerBrush.com.cyhttps://www.fda.gov/media/136312/download Fact Sheet for Healthcare Providers: https://pope.com/https://www.fda.gov/media/136313/download This test is not yet approved or cleared by the [place] FDA and has been authorized for detection and/or diagnosis of SARS-CoV-2 by FDA under an Emergency Use Authorization (EUA).  This EUA will remain in  effect (meaning this test can be used) for the duration of the COVID-19 declaration under Section 564(b)(1) of the Act, 21 U.S.C. section 360bbb-3(b)(1),  unless the authorization is terminated or revoked sooner. Performed at [facility], [address] [place], [place] [address]    Time coordinating discharge: Over 30 minutes  SIGNED:  Azucena Fallen, DO [facility] 07/17/2019, 11:24 AM Pager   If 7PM-7AM, please contact night-coverage [email or web address] Password [email or web address]

## 2019-07-17 NOTE — TOC Transition Note (Signed)
Transition of Care [facility]) - CM/SW Discharge Note   Patient Details  Name: Zachary Mccann MRN: [id number] Date of Birth: January 19, 1971  [facility]) CM/SW Contact:  Pollie Friar, RN Phone Number: 07/17/2019, 11:53 AM   Clinical Narrative:    Pt is discharging to CIR today. CM signing off.    Final next level of care: IP Rehab Facility Barriers to Discharge: Inadequate or no insurance, Barriers Unresolved (comment)   Patient Goals and CMS Choice        Discharge Placement                       Discharge Plan and Services                                     Social Determinants of Health (SDOH) Interventions     Readmission Risk Interventions No flowsheet data found.

## 2019-07-18 ENCOUNTER — Inpatient Hospital Stay (HOSPITAL_COMMUNITY): Payer: Self-pay

## 2019-07-18 ENCOUNTER — Inpatient Hospital Stay (HOSPITAL_COMMUNITY): Payer: Self-pay | Admitting: Speech Pathology

## 2019-07-18 DIAGNOSIS — I517 Cardiomegaly: Secondary | ICD-10-CM

## 2019-07-18 DIAGNOSIS — I10 Essential (primary) hypertension: Secondary | ICD-10-CM

## 2019-07-18 DIAGNOSIS — I5021 Acute systolic (congestive) heart failure: Secondary | ICD-10-CM

## 2019-07-18 DIAGNOSIS — I69391 Dysphagia following cerebral infarction: Secondary | ICD-10-CM

## 2019-07-18 MED ORDER — AMLODIPINE BESYLATE 2.5 MG PO TABS
2.5000 mg | ORAL_TABLET | Freq: Every day | ORAL | Status: DC
  Administered 2019-07-18 – 2019-07-19 (×2): 2.5 mg via ORAL
  Filled 2019-07-18 (×2): qty 1

## 2019-07-18 NOTE — Evaluation (Addendum)
Speech Language Pathology Assessment and Plan  Patient Details  Name: Zachary Mccann MRN: [id number] Date of Birth: [birth year]  SLP Diagnosis: Apraxia;Dysphagia;Speech and Language deficits  Rehab Potential: Good ELOS: 16-20 days   Today's Date: 07/18/2019 SLP Individual Time: 9379-0240 SLP Individual Time Calculation (min): 60 min   Problem List:  Patient Active Problem List   Diagnosis Date Noted  . Left middle cerebral artery stroke ([place]) 07/17/2019  . Cerebral embolism with cerebral infarction 07/11/2019  . Acute CVA (cerebrovascular accident) ([place]) 07/11/2019  . Slurred speech 07/10/2019  . Cardiomyopathy ([place]) 11/20/2018  . History of stroke 09/06/2018  . Tobacco use 09/06/2018  . History of non-ST elevation myocardial infarction (NSTEMI) 05/05/2018  . Severe Vitamin D deficiency 04/02/2018  . Community acquired pneumonia of left lower lobe of lung ([place]) 04/02/2018  . CKD (chronic kidney disease), stage III ([place]) = Secondary to Hypertension 04/02/2018  . Pneumonia 03/30/2018  . Metabolic acidosis 97/35/3299  . AKI (acute kidney injury) ([place]) 03/30/2018  . N&V (nausea and vomiting) 03/30/2018  . Essential hypertension 03/30/2018   Past Medical History:  Past Medical History:  Diagnosis Date  . CAD (coronary artery disease)    a. s/p NSTEMI in 04/2018 with DES to LCx.   [name] Hypertension   . Myocardial infarction ([place])   . Pneumonia   . Stroke [facility])    Past Surgical History:  Past Surgical History:  Procedure Laterality Date  . CORONARY STENT INTERVENTION N/A 05/05/2018   Procedure: CORONARY STENT INTERVENTION;  Surgeon: Leonie Man, MD;  Location: [place] [facility];  Service: Cardiovascular;  Laterality: N/A;  . LEFT HEART CATH AND CORONARY ANGIOGRAPHY N/A 05/05/2018   Procedure: LEFT HEART CATH AND CORONARY ANGIOGRAPHY;  Surgeon: Leonie Man, MD;  Location: [place] [facility];  Service: Cardiovascular;  Laterality: N/A;  . TEE WITHOUT CARDIOVERSION N/A  09/08/2018   Procedure: TRANSESOPHAGEAL ECHOCARDIOGRAM (TEE);  Surgeon: Larey Dresser, MD;  Location: [facility] ENDOSCOPY;  Service: Cardiovascular;  Laterality: N/A;    Assessment / Plan / Recommendation Clinical Impression MEQ:ASTMH T. Hodapp is a 48 year old right-handed male with history of left cerebellar CVA September 2019, CAD non-STEMI status post stenting May 2019 followed by Dr. Carolanne Grumbling on Effient in the past, hypertension, CKD stage III with creatinine 1.82-2.38 tobacco abuseas well as medical noncompliance. Per chart review patient lives with fianc. 2 level home with bedroom on Main level and 2 steps to entry. Patient uses a cane and a walker prior to admission.Presented 07/10/2019 with aphasia, right facial droop and right side weakness. Urine drug screen positive marijuana, COVID negative, creatinine 2.38. Cranial CT scan showed areas of prior infarction in the superior aspect of the left cerebellum. No evidence of acute hemorrhage noted. Patient did not receive TPA. MRI/MRA showed occluded left internal carotid artery with reconstitution of the level of the left posterior communicating artery. Scattered punctate foci of cortical infarct involving the anterior left frontal lobe and left parietal lobe. Extensive left hemisphere acute subacute white matter infarcts involving the left centrum semiovale and corona radiata as well as border zone areas. Echocardiogram with ejection fraction of 40%. Neurology consulted presently on Plavix for CVA prophylaxis. Patient does have an allergy to aspirin. Full liquid diet with nectar thick consistency. Therapy evaluation completed and patient was admitted for a comprehensive rehab program  Clinical swallow evaluation was completed. A severe oral dysphagia persists. With trials of nectar thick liquids via cup moderate anterior loss was observed despite tactile cue for  head extension to facilitate transport of the bolus.  Patient was most successful with nectar thick liquids via teaspoon which reduced anterior loss to a minimal amount. With both puree and nectar thick liquids intermittent oral holding of the bolus was noted. With verbal cue patient was able to trigger a swallow. Mild lingual residues were observed with both puree and nectar thick liquids. S/sx of aspiration were observed with thin liquids via teaspoon including: immediate cough with 3/3 trials. No overt s/sx of aspiration were observed with nectar thick liquids or pureed consistency. Patient does require prolonged amounts of time to consume PO. Patient took approximately 30 minutes to consume 2oz of nectar thick liquids and 2oz of puree. Recommend continue full liquid nectar thick diet with medications crushed. Question if patient will be able to meet his nutritional needs by PO intake.   Speech and language evaluation was initiated. Patient was nonverbal making no attempts at vocalizations despite max cues. Patient communicates by utilizing thumbs up/down to respond to yes/no questions. Patient also is able to write short phrases to facilitate communication. Patient responded to basic and complex yes/no questions with 100% accuracy. Patient followed basic 1 and 2 step directions with 100% accuracy. Mild difficulty was noted with complex multi step directions. Patient was able to name objects with 100% accuracy by writing the names of the objects. While patient's cognitive skills are difficult to assess due to severity of communication impairments reduced attention to task was noted. Patient will need further assessment of speech and language skills.  Patient would benefit from skilled SLP services during CIR targeting dysphagia and communication deficits.   Skilled Therapeutic Interventions          Clinical swallow evaluation and initiation of speech/language evaluation.  SLP Assessment  Patient will need skilled Speech Lanaguage Pathology Services during  CIR admission    Recommendations  SLP Diet Recommendations: Nectar;Dysphagia 1 (Puree) Liquid Administration via: Spoon;Cup Medication Administration: Crushed with puree Supervision: Staff to assist with self feeding;Full supervision/cueing for compensatory strategies Compensations: Small sips/bites;Monitor for anterior loss Postural Changes and/or Swallow Maneuvers: Seated upright 90 degrees Oral Care Recommendations: Oral care BID Patient destination: Home Follow up Recommendations: Home Health SLP Equipment Recommended: To be determined    SLP Frequency 3 to 5 out of 7 days   SLP Duration  SLP Intensity  SLP Treatment/Interventions 16-20 days  Minumum of 1-2 x/day, 30 to 90 minutes  Speech/Language facilitation;Therapeutic Exercise;Patient/family education;Dysphagia/aspiration precaution training;Multimodal communication approach;Therapeutic Activities    Pain Pain Assessment Pain Scale: 0-10 Pain Score: 0-No pain  Prior Functioning Cognitive/Linguistic Baseline: Within functional limits Type of Home: House  Short Term Goals: Week 1: SLP Short Term Goal 1 (Week 1): Patient will consume current diet without overt s/sx of aspiration with mod A verbal cues for use of swallowing compensatory strategies. SLP Short Term Goal 2 (Week 1): Patient will consume trials of thin liquids with no overt s/sx of aspiration. SLP Short Term Goal 3 (Week 1): Patient will participate in further assessment of speech and language skills.  Refer to Care Plan for Long Term Goals  Recommendations for other services: None   Discharge Criteria: Patient will be discharged from SLP if patient refuses treatment 3 consecutive times without medical reason, if treatment goals not met, if there is a change in medical status, if patient makes no progress towards goals or if patient is discharged from hospital.  The above assessment, treatment plan, treatment alternatives and goals were discussed and  mutually agreed upon:  by patient  Cristy Folks 07/18/2019, 11:50 AM

## 2019-07-18 NOTE — Evaluation (Signed)
Occupational Therapy Assessment and Plan  Patient Details  Name: TOMOKI LUCKEN MRN: [id number] Date of Birth: 11-Oct-1971  OT Diagnosis: disturbance of vision, hemiplegia affecting dominant side and muscle weakness (generalized) Rehab Potential: Rehab Potential (ACUTE ONLY): Good ELOS: 3-4 weeks   Today's Date: 07/18/2019 OT Individual Time: 1300-1400 OT Individual Time Calculation (min): 60 min     Problem List:  Patient Active Problem List   Diagnosis Date Noted  . Uncontrolled hypertension   . Dysphagia, post-stroke   . Cardiomegaly   . Acute systolic congestive heart failure ([place])   . Left middle cerebral artery stroke ([place]) 07/17/2019  . Cerebral embolism with cerebral infarction 07/11/2019  . Acute CVA (cerebrovascular accident) ([place]) 07/11/2019  . Slurred speech 07/10/2019  . Cardiomyopathy ([place]) 11/20/2018  . History of stroke 09/06/2018  . Tobacco use 09/06/2018  . History of non-ST elevation myocardial infarction (NSTEMI) 05/05/2018  . Severe Vitamin D deficiency 04/02/2018  . Community acquired pneumonia of left lower lobe of lung ([place]) 04/02/2018  . CKD (chronic kidney disease), stage III ([place]) = Secondary to Hypertension 04/02/2018  . Pneumonia 03/30/2018  . Metabolic acidosis 32/99/2426  . AKI (acute kidney injury) ([place]) 03/30/2018  . N&V (nausea and vomiting) 03/30/2018  . Essential hypertension 03/30/2018    Past Medical History:  Past Medical History:  Diagnosis Date  . CAD (coronary artery disease)    a. s/p NSTEMI in 04/2018 with DES to LCx.   [name] Hypertension   . Myocardial infarction ([place])   . Pneumonia   . Stroke [facility])    Past Surgical History:  Past Surgical History:  Procedure Laterality Date  . CORONARY STENT INTERVENTION N/A 05/05/2018   Procedure: CORONARY STENT INTERVENTION;  Surgeon: Leonie Man, MD;  Location: [place] CV LAB;  Service: Cardiovascular;  Laterality: N/A;  . LEFT HEART CATH AND CORONARY ANGIOGRAPHY N/A 05/05/2018    Procedure: LEFT HEART CATH AND CORONARY ANGIOGRAPHY;  Surgeon: Leonie Man, MD;  Location: [place] [facility];  Service: Cardiovascular;  Laterality: N/A;  . TEE WITHOUT CARDIOVERSION N/A 09/08/2018   Procedure: TRANSESOPHAGEAL ECHOCARDIOGRAM (TEE);  Surgeon: Larey Dresser, MD;  Location: [facility] ENDOSCOPY;  Service: Cardiovascular;  Laterality: N/A;    Assessment & Plan Clinical Impression: Jarrette Dehner is a 48 year old right-handed male with history of left cerebellar CVA September 2019, CAD non-STEMI status post stenting May 2019 followed by Dr. Carolanne Grumbling on Effient in the past, hypertension, CKD stage III with creatinine 1.82-2.38 tobacco abuseas well as medical noncompliance. Per chart review patient lives with fianc. 2 level home with bedroom on Main level and 2 steps to entry. Patient uses a cane and a walker prior to admission.Presented 07/10/2019 with aphasia, right facial droop and right side weakness. Urine drug screen positive marijuana, COVID negative, creatinine 2.38. Cranial CT scan showed areas of prior infarction in the superior aspect of the left cerebellum. No evidence of acute hemorrhage noted. Patient did not receive TPA. MRI/MRA showed occluded left internal carotid artery with reconstitution of the level of the left posterior communicating artery. Scattered punctate foci of cortical infarct involving the anterior left frontal lobe and left parietal lobe. Extensive left hemisphere acute subacute white matter infarcts involving the left centrum semiovale and corona radiata as well as border zone areas. Echocardiogram with ejection fraction of 40%. Neurology consulted presently on Plavix for CVA prophylaxis. Patient does have an allergy to aspirin. Full liquid diet with nectar thick consistency. Therapy evaluation completed and patient was admitted  for a comprehensive rehab program  Patient transferred to CIR on 07/17/2019 .    Patient currently  requires max with basic self-care skills secondary to muscle weakness, decreased cardiorespiratoy endurance, impaired timing and sequencing, unbalanced muscle activation, decreased coordination and decreased motor planning, decreased visual acuity, decreased visual perceptual skills and decreased visual motor skills, decreased attention to right and right side neglect, decreased initiation, decreased attention, decreased problem solving and decreased safety awareness and decreased sitting balance, decreased standing balance, decreased postural control, hemiplegia and decreased balance strategies.  Prior to hospitalization, patient could complete ADLs with modified independent .  Patient will benefit from skilled intervention to decrease level of assist with basic self-care skills prior to discharge home with care partner.  Anticipate patient will require 24 hour supervision and minimal physical assistance and follow up home health.  OT - End of Session Activity Tolerance: Tolerates 10 - 20 min activity with multiple rests Endurance Deficit: Yes Endurance Deficit Description: generalized weakness OT Assessment Rehab Potential (ACUTE ONLY): Good OT Patient demonstrates impairments in the following area(s): Balance;Perception;Cognition;Safety;Sensory;Edema;Endurance;Motor;Vision;Nutrition OT Basic ADL's Functional Problem(s): Eating;Grooming;Bathing;Dressing;Toileting OT Transfers Functional Problem(s): Toilet;Tub/Shower OT Additional Impairment(s): Fuctional Use of Upper Extremity OT Plan OT Intensity: Minimum of 1-2 x/day, 45 to 90 minutes OT Frequency: 5 out of 7 days OT Duration/Estimated Length of Stay: 3-4 weeks OT Treatment/Interventions: Balance/vestibular training;Discharge planning;Functional electrical stimulation;Pain management;Self Care/advanced ADL retraining;Therapeutic Activities;UE/LE Coordination activities;Visual/perceptual remediation/compensation;Therapeutic  Exercise;Patient/family education;Functional mobility training;Cognitive remediation/compensation;Disease mangement/prevention;Community reintegration;DME/adaptive equipment instruction;Neuromuscular re-education;Psychosocial support;Splinting/orthotics;UE/LE Strength taining/ROM;Wheelchair propulsion/positioning OT Self Feeding Anticipated Outcome(s): set up assist OT Basic Self-Care Anticipated Outcome(s): min A OT Toileting Anticipated Outcome(s): min A OT Bathroom Transfers Anticipated Outcome(s): min A OT Recommendation Patient destination: Home Follow Up Recommendations: Home health OT Equipment Recommended: To be determined   Skilled Therapeutic Intervention Skilled OT evaluation completed. Pt edu on OT POC, ELOS, and condition insight/CVA recovery. Pt's R UE is presenting as a Brunnstrom level 2, with activation observed at all joints except the proximal shoulder. Pt globally aphasic and gives ~95% consistent thumbs up/down for communication. At rest pt has L gaze preference and demonstrates a mild R neglect during ADLs. Pt bathed UB with min A and max A for LB. Pt requiring max +2 to stand from EOB with RW. Pt would benefit from orthosis on the RW to increase R UE support. Pt was unable to weight shift R or L in standing so decided to not pursue stand pivot transfer for safety. Pt used slideboard and transferred to w/c with max A, toward strong side (L). Pt completed dynavision, with a reaction time difference of L: 3, R: 6.26. pt required no cueing for head turning toward the R side. Pt left sitting up with all needs met, chair alarm set. Nursing staff informed of transfer method.    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)    Vision Baseline Vision/History: No visual deficits Patient Visual Report: Diplopia Vision Assessment?: Yes Eye Alignment: Impaired (comment)(L gaze) Ocular Range of Motion: Within Functional Limits  Alignment/Gaze Preference: Gaze left Tracking/Visual Pursuits: Decreased smoothness of eye movement to RIGHT superior field;Decreased smoothness of eye movement to RIGHT inferior field;Requires cues, head turns, or add eye shifts to track Saccades: Additional eye shifts occurred during testing;Decreased speed of saccadic movement Convergence: Within functional limits Diplopia Assessment: Other (comment)(difficult to fully assess 2/2 aphasia, reports some diplopia in L eye) Perception  Perception: Impaired Inattention/Neglect: Does not attend to right visual field Praxis Praxis: Impaired Praxis Impairment Details: Perseveration;Initiation Cognition Overall Cognitive Status: Impaired/Different from baseline Arousal/Alertness: Awake/alert Orientation Level: Nonverbal/unable to assess Immediate Memory Recall: (grossly aphasic, did not time time for written BIMS) Attention: Sustained Sustained Attention: Impaired Sustained Attention Impairment: Functional basic Awareness: Impaired Awareness Impairment: Intellectual impairment Problem Solving: Impaired Problem Solving Impairment: Functional basic Executive Function: Self Monitoring Self Monitoring: Impaired Self Monitoring Impairment: Functional basic Behaviors: Perseveration Safety/Judgment: Impaired Sensation Sensation Light Touch: Impaired by gross  assessment Proprioception: Impaired by gross assessment Additional Comments: Pt gives a thumbs up for sensation being intact but is does not give 100% accurate localizations Coordination Gross Motor Movements are Fluid and Coordinated: No Fine Motor Movements are Fluid and Coordinated: No Coordination and Movement Description: R hemi Motor  Motor Motor: Hemiplegia Motor - Skilled Clinical Observations: generalized weakness, R hemi Mobility  Bed Mobility Bed Mobility: Rolling Right;Rolling Left;Left Sidelying to Sit Rolling Right: Moderate Assistance - Patient 50-74% Rolling Left: Moderate Assistance - Patient 50-74% Left Sidelying to Sit: Maximal Assistance - Patient 25-49% Transfers Sit to Stand: 2 Helpers Stand to Sit: 2 Helpers  Trunk/Postural Assessment  Cervical Assessment Cervical Assessment: Exceptions to WFL(forward head) Thoracic Assessment Thoracic Assessment: Exceptions to WFL(rounded shoulders, kyphotic posture) Lumbar Assessment Lumbar Assessment: Exceptions to WFL(posterior pelvic tilt) Postural Control Postural Control: Deficits on evaluation Trunk Control: delayed Protective Responses: delayed  Balance Balance Balance Assessed: Yes Static Sitting Balance Static Sitting - Balance Support: Feet supported Static Sitting - Level of Assistance: 5: Stand by assistance Dynamic Sitting Balance Dynamic Sitting - Balance Support: Feet supported Dynamic Sitting - Level of Assistance: 4: Min assist Sitting balance - Comments: Pt able to maintain balance while reaching forward to wash distal LE Static Standing Balance Static Standing - Balance Support: During functional activity Static Standing - Level of Assistance: 3: Mod assist Extremity/Trunk Assessment RUE Assessment RUE Assessment: Exceptions to [facility] RUE Body System: Neuro Brunstrum levels for arm and hand: Arm;Hand Brunstrum level for arm: Stage II Synergy is developing Brunstrum level for hand: Stage II  Synergy is developing LUE Assessment LUE Assessment: Exceptions to [facility] General Strength Comments: 4-/5 strength     Refer to Care Plan for Long Term Goals  Recommendations for other services: None    Discharge Criteria: Patient will be discharged from OT if patient refuses treatment 3 consecutive times without medical reason, if treatment goals not met, if there is a change in medical status, if patient makes no progress towards goals or if patient is discharged from hospital.  The above assessment, treatment plan, treatment alternatives and goals were discussed and mutually agreed upon: by patient  Curtis Sites 07/18/2019, 4:47 PM

## 2019-07-18 NOTE — Progress Notes (Addendum)
Physical Therapy Assessment and Plan  Patient Details  Name: Zachary Mccann MRN: [id number] Date of Birth: [birth year]  PT Diagnosis: Abnormal posture, Abnormality of gait, Cognitive deficits, Coordination disorder, Difficulty walking, Edema, Hemiparesis dominant, Hemiplegia dominant, Hypotonia, Impaired cognition, Impaired sensation and Muscle weakness Rehab Potential: Good ELOS: 3-4 weeks   Today's Date: 07/18/2019 PT Individual Time: 0930-1030 PT Individual Time Calculation (min): 60 min    Problem List:  Patient Active Problem List   Diagnosis Date Noted  . Uncontrolled hypertension   . Dysphagia, post-stroke   . Cardiomegaly   . Acute systolic congestive heart failure ([place])   . Left middle cerebral artery stroke ([place]) 07/17/2019  . Cerebral embolism with cerebral infarction 07/11/2019  . Acute CVA (cerebrovascular accident) ([place]) 07/11/2019  . Slurred speech 07/10/2019  . Cardiomyopathy ([place]) 11/20/2018  . History of stroke 09/06/2018  . Tobacco use 09/06/2018  . History of non-ST elevation myocardial infarction (NSTEMI) 05/05/2018  . Severe Vitamin D deficiency 04/02/2018  . Community acquired pneumonia of left lower lobe of lung ([place]) 04/02/2018  . CKD (chronic kidney disease), stage III ([place]) = Secondary to Hypertension 04/02/2018  . Pneumonia 03/30/2018  . Metabolic acidosis 95/63/8756  . AKI (acute kidney injury) ([place]) 03/30/2018  . N&V (nausea and vomiting) 03/30/2018  . Essential hypertension 03/30/2018    Past Medical History:  Past Medical History:  Diagnosis Date  . CAD (coronary artery disease)    a. s/p NSTEMI in 04/2018 with DES to LCx.   [name] Hypertension   . Myocardial infarction ([place])   . Pneumonia   . Stroke [facility])    Past Surgical History:  Past Surgical History:  Procedure Laterality Date  . CORONARY STENT INTERVENTION N/A 05/05/2018   Procedure: CORONARY STENT INTERVENTION;  Surgeon: Zachary Man, MD;  Location: [facility];  Service:  Cardiovascular;  Laterality: N/A;  . LEFT HEART CATH AND CORONARY ANGIOGRAPHY N/A 05/05/2018   Procedure: LEFT HEART CATH AND CORONARY ANGIOGRAPHY;  Surgeon: Zachary Man, MD;  Location: [place] [facility];  Service: Cardiovascular;  Laterality: N/A;  . TEE WITHOUT CARDIOVERSION N/A 09/08/2018   Procedure: TRANSESOPHAGEAL ECHOCARDIOGRAM (TEE);  Surgeon: Zachary Dresser, MD;  Location: [facility] ENDOSCOPY;  Service: Cardiovascular;  Laterality: N/A;    Assessment & Plan Clinical Impression: Patient is a 48 y.o. year old right-handed male with history of left cerebellar CVA September 2019, CAD non-STEMI status post stenting May 2019 followed by Zachary Mccann on Effient in the past, hypertension, CKD stage III with creatinine 1.82-2.38 tobacco abuseas well as medical noncompliance. Per chart review patient lives with fianc. 2 level home with bedroom on Main level and 2 steps to entry. Patient uses a cane and a walker prior to admission.Presented 07/10/2019 with aphasia, right facial droop and right side weakness. Urine drug screen positive marijuana, COVID negative, creatinine 2.38. Cranial CT scan showed areas of prior infarction in the superior aspect of the left cerebellum. No evidence of acute hemorrhage noted. Patient did not receive TPA. MRI/MRA showed occluded left internal carotid artery with reconstitution of the level of the left posterior communicating artery. Scattered punctate foci of cortical infarct involving the anterior left frontal lobe and left parietal lobe. Extensive left hemisphere acute subacute white matter infarcts involving the left centrum semiovale and corona radiata as well as border zone areas. Echocardiogram with ejection fraction of 40%. Neurology consulted presently on Plavix for CVA prophylaxis. Patient does have an allergy to aspirin. Full liquid diet with nectar  thick consistency. Therapy evaluation completed and patient was admitted for a  comprehensive rehab program  Patient transferred to CIR on 07/17/2019 .   Patient currently requires max with mobility secondary to muscle weakness, decreased cardiorespiratoy endurance, impaired timing and sequencing, abnormal tone, unbalanced muscle activation, decreased coordination and decreased motor planning, decreased visual motor skills, decreased attention to right and decreased motor planning, decreased initiation, decreased awareness, decreased problem solving and decreased safety awareness and decreased sitting balance, decreased standing balance, decreased postural control, hemiplegia and decreased balance strategies.  Prior to hospitalization, patient was modified independent  with mobility and lived with Spouse in a House home.  Home access is 2Stairs to enter.  Patient will benefit from skilled PT intervention to maximize safe functional mobility, minimize fall risk and decrease caregiver burden for planned discharge home with 24 hour assist.  Anticipate patient will benefit from follow up [facility] at discharge.  PT - End of Session Activity Tolerance: Tolerates 30+ min activity with multiple rests Endurance Deficit: Yes Endurance Deficit Description: decreased endurance with all activity secondary to generalize weakness PT Assessment Rehab Potential (ACUTE/IP ONLY): Good PT Barriers to Discharge: Inaccessible home environment;Home environment access/layout PT Barriers to Discharge Comments: 2 STE and 2 story home, patient can live on first level with bed and bathroom. PT Patient demonstrates impairments in the following area(s): Balance;Behavior;Edema;Endurance;Motor;Nutrition;Pain;Perception;Safety;Sensory;Skin Integrity PT Transfers Functional Problem(s): Bed Mobility;Bed to Chair;Car;Furniture;Floor PT Locomotion Functional Problem(s): Ambulation;Wheelchair Mobility;Stairs PT Plan PT Intensity: Minimum of 1-2 x/day ,45 to 90 minutes PT Frequency: 5 out of 7 days PT Duration  Estimated Length of Stay: 3-4 weeks PT Treatment/Interventions: Ambulation/gait training;Discharge planning;Functional mobility training;Psychosocial support;Therapeutic Activities;Visual/perceptual remediation/compensation;Wheelchair propulsion/positioning;Therapeutic Exercise;Skin care/wound management;Neuromuscular re-education;Disease management/prevention;Balance/vestibular training;Cognitive remediation/compensation;DME/adaptive equipment instruction;Pain management;Splinting/orthotics;UE/LE Strength taining/ROM;Community reintegration;Functional electrical stimulation;Patient/family education;Stair training;UE/LE Coordination activities PT Transfers Anticipated Outcome(s): min A using LRAD PT Locomotion Anticipated Outcome(s): min A using LRAD 20' PT Recommendation Follow Up Recommendations: Home health PT Patient destination: Home Equipment Recommended: To be determined;Other (comment) Equipment Details: Patient using RW and cane PTA.  Skilled Therapeutic Intervention In addition to the evaluation below, the patient performed the following skilled PT interventions:   Patient in bed upon PT arrival. Patient alert and agreeable to PT session. Patient presents with expressive aphasia and utilized thumbs up and down to indicate yes/no answers to questions throughout session.   Therapeutic Activity: Bed Mobility: Performed bed mobility as below with cues for proper technique. Patient sat EOB x10 min with min A initially and progressing to supervision with static sitting. He performed reaching in all directions with his L UE with slow movements and undershooting to reach therapists hand as a target. PT was called away during evaluation to discuss another patient with an MD. Patient sat EOB with supervision provided by NT during PT absence. Missed 5 min of skilled PT.  Transfers: Patient attempted sit to/from stand x1 with max A of 1 person with PT blocking R knee. Patient unable to clear his  bottocks off the bed. Provided cues for leaning far forward and shifting his weight to the L to stand. He attempted a slide board transfer to the w/c, however, he required increased time to follow cues for leaning R to his elbow to place the board and perseverated on wiping drool from his mouth despite therapists cues to attend to the task. He proceeded to scoot on slide board with max A of 1 person x1 scoot, then patient tapped therapist on the shoulder and pointed to his bottom. PT asked if  he needed to have a BM and patient signaled a thumbs up and was assisted back to bed, as above, and nursing staff was called to assist patient with toileting using the bed pan.   Provided patient with 22"x18" w/c and bariatric slide board during session.  Patient in bed with NT and RN at bedside at end of session with breaks locked and all needs within reach.  Instructed pt in results of PT evaluation as detailed below, PT POC, rehab potential, rehab goals, and discharge recommendations. Additionally discussed CIR's policies regarding fall safety and use of chair alarm and/or quick release belt. Pt verbalized understanding and in agreement. Will update pt's family members as they become available.    PT Evaluation Precautions/Restrictions Precautions Precautions: Fall Restrictions Weight Bearing Restrictions: No General   Vital Signs  BP: 156/105 HR: 75 O2: 100% on RA Pain Pain Assessment Pain Scale: 0-10 Pain Score: 0-No pain Home Living/Prior Functioning Home Living Available Help at Discharge: Available 24 hours/day Type of Home: House Home Access: Stairs to enter [employer] of Steps: 2 Entrance Stairs-Rails: Right Home Layout: Two level;Able to live on main level with bedroom/bathroom Bathroom Shower/Tub: Multimedia programmer: Standard  Lives With: Spouse Prior Function  Able to Take Stairs?: Yes Driving: No 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)   Vision/Perception  Vision - Assessment Eye Alignment: Impaired (comment)(L gaze) Ocular Range of Motion: Within Functional Limits Alignment/Gaze Preference: Gaze left Tracking/Visual Pursuits: Decreased smoothness of vertical tracking;Decreased smoothness of horizontal tracking;Decreased smoothness of eye movement to RIGHT superior field;Decreased smoothness of eye movement to RIGHT inferior field Saccades: Additional head turns occurred during testing;Additional eye shifts occurred during testing Convergence: Within functional limits Diplopia Assessment: Other (comment)(difficult to fully assess 2/2 aphasia, reports some diplopia in L eye) Perception Perception: Impaired Inattention/Neglect: Does not attend to right visual field Praxis Praxis: Intact Praxis Impairment Details: Perseveration;Initiation  Cognition Overall Cognitive Status: Impaired/Different from baseline Arousal/Alertness: Awake/alert Attention: Sustained Sustained Attention: Impaired Sustained Attention Impairment: Functional basic Awareness: Impaired Awareness Impairment: Intellectual impairment Problem Solving: Impaired Problem Solving Impairment: Functional basic Executive Function: Self Monitoring;Sequencing Sequencing: Impaired Sequencing Impairment: Functional basic Self Monitoring: Impaired Self Monitoring Impairment: Functional basic Behaviors: Perseveration Safety/Judgment: Impaired Sensation Sensation Light Touch: Impaired by gross assessment Proprioception: Impaired by gross assessment Additional Comments: Pt gives a thumbs up for sensation being intact but is does not give 100% accurate localizations Coordination Gross Motor Movements are Fluid and Coordinated: No Fine Motor Movements are Fluid and Coordinated: No Coordination and Movement Description: R hemi with residual L side weakness from previous CVA Motor  Motor Motor: Hemiplegia Motor - Skilled Clinical Observations: generalized  weakness, R hemi  Mobility Bed Mobility Bed Mobility: Rolling Right;Rolling Left;Left Sidelying to Sit Rolling Right: Moderate Assistance - Patient 50-74% Rolling Left: Moderate Assistance - Patient 50-74% Left Sidelying to Sit: Maximal Assistance - Patient 25-49% Transfers Transfers: Sit to Stand;Lateral/Scoot Transfers;Stand to Sit Sit to Stand: 2 Helpers Stand to Sit: 2 Helpers Lateral/Scoot Transfers: Maximal Assistance - Patient 25-49% Transfer (Assistive device): (Rw during sit <> stand and slideboard for lateral scoot) Locomotion  Gait Ambulation: No Gait Gait: No Stairs / Additional Locomotion Stairs: No Wheelchair Mobility Wheelchair Mobility: No  Trunk/Postural Assessment  Cervical Assessment Cervical Assessment: Exceptions to WFL(forward head) Thoracic Assessment Thoracic Assessment: Exceptions to WFL(rounded shoulders, kyphotic posture) Lumbar Assessment Lumbar Assessment: Exceptions to WFL(posterior pelvic tilt) Postural Control Postural Control: Deficits on evaluation Trunk Control: delayed Protective Responses: delayed  Balance Balance Balance Assessed: Yes Static  Sitting Balance Static Sitting - Balance Support: Feet supported Static Sitting - Level of Assistance: 5: Stand by assistance Dynamic Sitting Balance Dynamic Sitting - Balance Support: Feet supported Dynamic Sitting - Level of Assistance: 4: Min assist Dynamic Sitting - Balance Activities: Lateral lean/weight shifting;Forward lean/weight shifting;Reaching for objects;Reaching across midline Sitting balance - Comments: Pt able to maintain balance while reaching forward to wash distal LE Static Standing Balance Static Standing - Balance Support: During functional activity Static Standing - Level of Assistance: 3: Mod assist Extremity Assessment  RUE Assessment RUE Assessment: Exceptions to [facility] General Strength Comments: Grossly in sitting: 1-2/5 throughout RUE Body System: Neuro Brunstrum  levels for arm and hand: Arm;Hand Brunstrum level for arm: Stage II Synergy is developing Brunstrum level for hand: Stage II Synergy is developing LUE Assessment LUE Assessment: Exceptions to [facility] General Strength Comments: Grossly in sitting: 4-/5 throughout RLE Assessment RLE Assessment: Exceptions to [facility] Passive Range of Motion (PROM) Comments: tight hamstrings and heel cords General Strength Comments: Grossly in sitting: 1-2/5 throughout distal greater than proximal LLE Assessment LLE Assessment: Exceptions to [facility] General Strength Comments: Grossly in sitting: 4-/5 throughout    Refer to Care Plan for Long Term Goals  Recommendations for other services: None   Discharge Criteria: Patient will be discharged from PT if patient refuses treatment 3 consecutive times without medical reason, if treatment goals not met, if there is a change in medical status, if patient makes no progress towards goals or if patient is discharged from hospital.  The above assessment, treatment plan, treatment alternatives and goals were discussed and mutually agreed upon: by patient  Doreene Burke PT, DPT  07/18/2019, 5:41 PM

## 2019-07-18 NOTE — Progress Notes (Signed)
[place] PHYSICAL MEDICINE & REHABILITATION PROGRESS NOTE  Subjective/Complaints: Patient seen sitting up in bed this morning.  Indicates he slept fairly overnight.  Examination limited due to nonverbal position, but appears to give appropriate thumbs up/thumbs down.  Called by nursing overnight regarding elevated blood pressure as well as cough and stridor.  As well as discussed with nursing this morning regarding elevated blood pressures.  Respiratory symptoms appear to have resolved.  ROS: Denies CP, shortness of breath, nausea, vomiting, diarrhea.  Objective: Vital Signs: Blood pressure (!) 179/111, pulse 81, temperature 98 F (36.7 C), resp. rate 18, SpO2 98 %. Dg Chest Port 1 View  Result Date: 07/17/2019 CLINICAL DATA:  Cough with abnormal lung sounds on physical examination EXAM: PORTABLE CHEST 1 VIEW COMPARISON:  May 05, 2018 FINDINGS: There is no edema or consolidation. Heart is mildly enlarged with pulmonary vascularity normal. No adenopathy. No bone lesions. IMPRESSION: Heart mildly enlarged.  No edema or consolidation. Electronically Signed   By: Lowella Grip III M.D.   On: 07/17/2019 20:32   Recent Labs    07/16/19 0412  WBC 9.1  HGB 13.2  HCT 39.9  PLT 225   Recent Labs    07/16/19 0412  NA 142  K 4.0  CL 111  CO2 22  GLUCOSE 102*  BUN 14  CREATININE 1.63*  CALCIUM 9.3    Physical Exam: BP (!) 179/111   Pulse 81   Temp 98 F (36.7 C)   Resp 18   SpO2 98%  Constitutional: No distress . Vital signs reviewed.  HENT: Normocephalic.  Atraumatic. Eyes: EOMI. No discharge. Cardiovascular: No JVD. Respiratory: Normal effort.  No stridor.  Clear to auscultation. GI: Non-distended. Musc: No edema or tenderness in extremities. Musculoskeletal: Right-sided edema.  No tenderness Neurological:Alert Nonverbal. Motor: LUE/LLE: Grossly 4/5 Right upper extremity: 0/5 Right lower extremity: 2+/5  Skin: Skin iswarmand dry.  Psychiatric: Flat but  cooperative  Assessment/Plan: 1. Functional deficits secondary to left brain infarct with history of CVA which require 3+ hours per day of interdisciplinary therapy in a comprehensive inpatient rehab setting.  Physiatrist is providing close team supervision and 24 hour management of active medical problems listed below.  Physiatrist and rehab team continue to assess barriers to discharge/monitor patient progress toward functional and medical goals  Care Tool:  Bathing              Bathing assist       Upper Body Dressing/Undressing Upper body dressing        Upper body assist      Lower Body Dressing/Undressing Lower body dressing            Lower body assist       Toileting Toileting    Toileting assist       Transfers Chair/bed transfer  Transfers assist           Locomotion Ambulation   Ambulation assist              Walk 10 feet activity   Assist           Walk 50 feet activity   Assist           Walk 150 feet activity   Assist           Walk 10 feet on uneven surface  activity   Assist           Wheelchair     Assist  Wheelchair 50 feet with 2 turns activity    Assist            Wheelchair 150 feet activity     Assist            Medical Problem List and Plan: 1.Right side weakness, aphasia, dysphasiasecondary to left MCA and ACA scattered infarct due to left ICA occlusion as well as history of previous stroke in the past and medical noncompliance  Begin CIR evaluations   PRAFO and WHO for RLE and RUE2. Antithrombotics: -DVT/anticoagulation:SCDs -antiplatelet therapy: plavix 75mg daily 3. Pain Management:Tylenol as needed 4. Mood:Provide emotional support -antipsychotic agents: N/A 5. Neuropsych: This patientappears to be capable of making decisions on hisown behalf. 6. Skin/Wound Care:Routine skin checks 7.  Fluids/Electrolytes/Nutrition:Routine in and outs 8. Post stroke dysphagia. Full liquid diet nectar thick consistency. Follow-up speech therapy -aspiration precautions  Advance diet as tolerated 9. History of CAD with myocardial infarction status post stenting. Continue Plavix. Patient is followed by Dr. Dina Rich 10. Hypertension. Coreg 3.125 mg twice daily.   Hydralazine 10 3 times daily started on 7/31  Norvasc 2.5 started on 8/1  Monitor with increased mobility 11. Hyperlipidemia. Lipitor 12. History of tobacco as well as marijuana use. Counseling 13. Systolic CHF  Echo with EF of 97%  Chest x-ray personally reviewed, suggestive of cardiomegaly  LOS: 1 days A FACE TO FACE EVALUATION WAS PERFORMED  Khrystian Schauf Karis Juba 07/18/2019, 12:25 PM

## 2019-07-18 NOTE — Progress Notes (Signed)
Patient's blood pressure 173/106 HR 73. Hydralazine for 8 am given. MD made aware. Will monitor.

## 2019-07-19 ENCOUNTER — Inpatient Hospital Stay (HOSPITAL_COMMUNITY): Payer: Self-pay | Admitting: Occupational Therapy

## 2019-07-19 ENCOUNTER — Inpatient Hospital Stay (HOSPITAL_COMMUNITY): Payer: Self-pay | Admitting: Physical Therapy

## 2019-07-19 DIAGNOSIS — N183 Chronic kidney disease, stage 3 (moderate): Secondary | ICD-10-CM

## 2019-07-19 MED ORDER — AMLODIPINE BESYLATE 2.5 MG PO TABS
2.5000 mg | ORAL_TABLET | Freq: Once | ORAL | Status: AC
  Administered 2019-07-19: 2.5 mg via ORAL
  Filled 2019-07-19: qty 1

## 2019-07-19 MED ORDER — AMLODIPINE BESYLATE 5 MG PO TABS
5.0000 mg | ORAL_TABLET | Freq: Every day | ORAL | Status: DC
  Administered 2019-07-20: 5 mg via ORAL
  Filled 2019-07-19: qty 1

## 2019-07-19 MED ORDER — RESOURCE THICKENUP CLEAR PO POWD
ORAL | Status: DC | PRN
  Filled 2019-07-19: qty 125

## 2019-07-19 NOTE — Progress Notes (Addendum)
Physical Therapy Session Note  Patient Details  Name: Zachary Mccann MRN: [id number] Date of Birth: 1971-01-17  Today's Date: 07/19/2019 PT Individual Time: 0277-4128 PT Individual Time Calculation (min): 58 min   Short Term Goals: Week 1:  PT Short Term Goal 1 (Week 1): Patient will perform rolling in bed with min A with use of bed rails. PT Short Term Goal 2 (Week 1): Patient will perform supine to sit and sit to supine with mod A. PT Short Term Goal 3 (Week 1): Patient will perform sit<> stand with max A of 1 person using LRAD. PT Short Term Goal 3 - Progress (Week 1): Met PT Short Term Goal 4 (Week 1): Patient will perform basic transfers with max A of 1 person. PT Short Term Goal 4 - Progress (Week 1): Met PT Short Term Goal 5 (Week 1): Patient will initiate gait training. PT Short Term Goal 5 - Progress (Week 1): Met  Skilled Therapeutic Interventions/Progress Updates:   Pt continues to communicate through thumbs up and down.  Received in w/c.  No c/o pain.  Pt positioned in hallway to perform w/c mobility training as described below.  Therapist provided total A rest of way to gym.  Performed squat pivot from w/c > mat to L side with verbal cues for sequencing and to fully lift hips prior to pivoting to L; performed pivot to L to encourage pushing/extension through RLE.  Performed one stand with bilat UE support with pt demonstrating good trunk control and adequate R knee control to lift LLE off of floor.  Applied R hand orthosis to RW and performed gait training as documented below.  Performed negotiation of two stairs with L rail with therapist providing stabilization at R knee when ascending with LLE forwards.  Descended backwards leading with RLE with therapist's max A.  Returned to room and pt left with all items within reach and chair alarm set.  Therapy Documentation Precautions:  Precautions Precautions: Fall Restrictions Weight Bearing Restrictions: No Vital Signs: Therapy  Vitals BP: (!) 159/112 Pain: Pain Assessment Pain Scale: 0-10 Pain Score: 0-No pain Mobility: Transfers Transfers: Sit to Stand;Stand to Sit;Squat Pivot Transfers Sit to Stand: Moderate Assistance - Patient 50-74% Stand to Sit: Moderate Assistance - Patient 50-74% Squat Pivot Transfers: Moderate Assistance - Patient 50-74% Transfer (Assistive device): Rolling walker Locomotion : Gait Ambulation: Yes Gait Assistance: 2 Helpers Gait Distance (Feet): 40 Feet Assistive device: Rolling walker;Other (Comment)(R hand orthosis) Gait Assistance Details: verbal, visual and tactile cues for advancement of RW and stepping sequence.  Also provided cues for full R step length.  Required very light min A at R knee for stabilization during stance phase.  No assistance to advance RLE Gait Gait: Yes Gait Pattern: Impaired Gait Pattern: Step-to pattern;Step-through pattern;Decreased step length - right;Decreased stride length;Decreased hip/knee flexion - right;Decreased dorsiflexion - right;Right foot flat Stairs / Additional Locomotion Stairs: Yes Stairs Assistance: Maximal Assistance - Patient 25 - 49% Stair Management Technique: One rail Left;Step to pattern;Backwards;Forwards Number of Stairs: 2 Architect: Yes Wheelchair Assistance: Maximal Assistance - Patient 25 - 49% Wheelchair Propulsion: Left upper extremity;Left lower extremity Wheelchair Parts Management: Needs assistance Distance: 52'  With therapist providing verbal cues to attend to activity and for sequencing.  Provided cues for L hand placement and max cues at LLE to utilize foot on floor to steer chair.    Therapy/Group: Individual Therapy Rico Junker, PT, DPT 07/19/19    5:00 PM    07/19/2019,  5:00 PM

## 2019-07-19 NOTE — Progress Notes (Signed)
Occupational Therapy Session Note  Patient Details  Name: Zachary Mccann MRN: [id number] Date of Birth: 07-25-1971  Today's Date: 07/19/2019 OT Individual Time: 1100-1200 OT Individual Time Calculation (min): 60 min    Short Term Goals: Week 1:  OT Short Term Goal 1 (Week 1): Pt will complete transfer to [facility] with LRAD with mod +1 OT Short Term Goal 2 (Week 1): Pt will don shirt with min A OT Short Term Goal 3 (Week 1): Pt will require no more than min verbal cueing for use of hemi techniques during dressing OT Short Term Goal 4 (Week 1): Pt will complete peri hygiene in standing with max A  Skilled Therapeutic Interventions/Progress Updates:    patient in bed, alert, no vocalization, consistent yes/no responses with thumbs up/down and numerical responses with number of fingers.  Follows directions for adl tasks and mobility.  occ smiles t/o session.  LB bathing and dressing at bed level max A/dep, UB bathing/dressing seated in w/c with mod/max A.  Oral care min A.   Rolling in bed min a to right, max A to left, side lying to SSP max A.  Good seated balance edge of bed with CS.  Sit to stand at edge of bed max A of one with stand by of 2nd.  Sit pivot transfer mod A of one, cues for weight shift and directionality.  Completed right UE AAROM - noted volitional mobilty t/o right arm, does not attempt to use it functionally.  Provided right half lap tray.  Patient seated in w/c with seat belt alarm set and call bell in reach - he is able to demonstrate appropriate use of call bell.    Therapy Documentation Precautions:  Precautions Precautions: Fall Restrictions Weight Bearing Restrictions: No General:   Vital Signs:  Pain: Pain Assessment Pain Scale: CPOT Critical Care Pain Observation Tool (CPOT) Facial Expression: Relaxed, neutral Body Movements: Absence of movements Muscle Tension: Relaxed Compliance with ventilator (intubated pts.): N/A Vocalization (extubated pts.): Talking in  normal tone or no sound CPOT Total: 0   Other Treatments:     Therapy/Group: Individual Therapy  Carlos Levering 07/19/2019, 12:24 PM

## 2019-07-19 NOTE — Progress Notes (Signed)
MD was notified of patient's blood pressure 160/106 HR 75, 162/129 HR 80. Will monitor.

## 2019-07-19 NOTE — Progress Notes (Signed)
[place] PHYSICAL MEDICINE & REHABILITATION PROGRESS NOTE  Subjective/Complaints: Patient seen laying in bed this morning.  He indicates he slept fairly overnight.  He indicates he had a good first day of therapies yesterday.  ROS: Appears to deny CP, shortness of breath, nausea, vomiting, diarrhea.  Objective: Vital Signs: Blood pressure (!) 154/98, pulse 80, temperature 99.1 F (37.3 C), temperature source Oral, resp. rate 16, SpO2 100 %. Dg Chest Port 1 View  Result Date: 07/17/2019 CLINICAL DATA:  Cough with abnormal lung sounds on physical examination EXAM: PORTABLE CHEST 1 VIEW COMPARISON:  May 05, 2018 FINDINGS: There is no edema or consolidation. Heart is mildly enlarged with pulmonary vascularity normal. No adenopathy. No bone lesions. IMPRESSION: Heart mildly enlarged.  No edema or consolidation. Electronically Signed   By: Bretta Bang III M.D.   On: 07/17/2019 20:32   No results for input(s): WBC, HGB, HCT, PLT in the last 72 hours. No results for input(s): NA, K, CL, CO2, GLUCOSE, BUN, CREATININE, CALCIUM in the last 72 hours.  Physical Exam: BP (!) 154/98   Pulse 80   Temp 99.1 F (37.3 C) (Oral)   Resp 16   SpO2 100%  Constitutional: No distress . Vital signs reviewed.  HENT: Normocephalic.  Atraumatic. Eyes: EOMI.  No discharge. Cardiovascular: No JVD. Respiratory: Normal effort.  No stridor.   GI: Non-distended. Musc: No edema or tenderness in extremities. Musculoskeletal: Right-sided edema.  No tenderness Neurological:Alert Right facial weakness None verbal. Motor: LUE/LLE: Grossly 4/5 Right upper extremity: 2+/5 Right lower extremity: 3-/5  Skin: Skin iswarmand dry.  Psychiatric:Flat but cooperative  Assessment/Plan: 1. Functional deficits secondary to left brain infarct with history of CVA which require 3+ hours per day of interdisciplinary therapy in a comprehensive inpatient rehab setting.  Physiatrist is providing close team supervision  and 24 hour management of active medical problems listed below.  Physiatrist and rehab team continue to assess barriers to discharge/monitor patient progress toward functional and medical goals  Care Tool:  Bathing    Body parts bathed by patient: Chest, Left arm, Abdomen, Front perineal area, Right lower leg, Left upper leg, Left lower leg, Right upper leg, Face   Body parts bathed by helper: Right arm, Buttocks     Bathing assist Assist Level: Maximal Assistance - Patient 24 - 49%     Upper Body Dressing/Undressing Upper body dressing   What is the patient wearing?: Hospital gown only    Upper body assist Assist Level: Total Assistance - Patient < 25%    Lower Body Dressing/Undressing Lower body dressing      What is the patient wearing?: Incontinence brief     Lower body assist Assist for lower body dressing: Total Assistance - Patient < 25%     Toileting Toileting    Toileting assist Assist for toileting: 2 Helpers     Transfers Chair/bed transfer  Transfers assist  Chair/bed transfer activity did not occur: Safety/medical concerns(Decreased strength/balance)  Chair/bed transfer assist level: Maximal Assistance - Patient 25 - 49%     Locomotion Ambulation   Ambulation assist   Ambulation activity did not occur: Safety/medical concerns(decreased strength/balance/endurance)          Walk 10 feet activity   Assist  Walk 10 feet activity did not occur: Safety/medical concerns(decreased strength/balance/endurance)        Walk 50 feet activity   Assist Walk 50 feet with 2 turns activity did not occur: Safety/medical concerns(decreased strength/balance/endurance)  Walk 150 feet activity   Assist Walk 150 feet activity did not occur: Safety/medical concerns(decreased strength/balance/endurance)         Walk 10 feet on uneven surface  activity   Assist Walk 10 feet on uneven surfaces activity did not occur: Safety/medical  concerns(decreased strength/balance/endurance)         Programmer, multimedia activity did not occur: Safety/medical concerns(decreased strength/balance/endurance)         Wheelchair 50 feet with 2 turns activity    Assist    Wheelchair 50 feet with 2 turns activity did not occur: Safety/medical concerns(decreased strength/balance/endurance)       Wheelchair 150 feet activity     Assist Wheelchair 150 feet activity did not occur: Safety/medical concerns(decreased strength/balance/endurance)          Medical Problem List and Plan: 1.Right side weakness, aphasia, dysphasiasecondary to left MCA and ACA scattered infarct due to left ICA occlusion as well as history of previous stroke in the past and medical noncompliance  Continue CIR  PRAFO and WHO for RLE and RUE, awaiting 2. Antithrombotics: -DVT/anticoagulation:SCDs -antiplatelet therapy: plavix 75mg daily 3. Pain Management:Tylenol as needed 4. Mood:Provide emotional support -antipsychotic agents: N/A 5. Neuropsych: This patientappears to be capable of making decisions on hisown behalf. 6. Skin/Wound Care:Routine skin checks 7. Fluids/Electrolytes/Nutrition:Routine in and outs 8. Post stroke dysphagia. Full liquid diet nectar thick consistency. Follow-up speech therapy -aspiration precautions  Advance diet as tolerated 9. History of CAD with myocardial infarction status post stenting. Continue Plavix. Patient is followed by Dr. Carlyle Dolly 10. Hypertension. Coreg 3.125 mg twice daily.   Hydralazine 10 3 times daily started on 7/31  Norvasc 2.5 started on 8/1, increased to 5 on 8/2  Monitor with increased mobility 11. Hyperlipidemia. Lipitor 12. History of tobacco as well as marijuana use. Counseling 13. Systolic CHF  Echo with EF of 40%  Chest x-ray personally reviewed, suggestive of cardiomegaly  Daily weights  ordered There were no vitals filed for this visit.  14.  CKD stage III  Creatinine 1.63 on 7/30  Continue to monitor  LOS: 2 days A FACE TO FACE EVALUATION WAS PERFORMED  Ankit Lorie Phenix 07/19/2019, 10:52 AM

## 2019-07-20 ENCOUNTER — Inpatient Hospital Stay (HOSPITAL_COMMUNITY): Payer: Self-pay

## 2019-07-20 DIAGNOSIS — I5022 Chronic systolic (congestive) heart failure: Secondary | ICD-10-CM

## 2019-07-20 DIAGNOSIS — I5030 Unspecified diastolic (congestive) heart failure: Secondary | ICD-10-CM

## 2019-07-20 DIAGNOSIS — I130 Hypertensive heart and chronic kidney disease with heart failure and stage 1 through stage 4 chronic kidney disease, or unspecified chronic kidney disease: Secondary | ICD-10-CM

## 2019-07-20 DIAGNOSIS — N183 Chronic kidney disease, stage 3 unspecified: Secondary | ICD-10-CM

## 2019-07-20 LAB — COMPREHENSIVE METABOLIC PANEL
ALT: 10 U/L (ref 0–44)
AST: 14 U/L — ABNORMAL LOW (ref 15–41)
Albumin: 3.1 g/dL — ABNORMAL LOW (ref 3.5–5.0)
Alkaline Phosphatase: 67 U/L (ref 38–126)
Anion gap: 12 (ref 5–15)
BUN: 17 mg/dL (ref 6–20)
CO2: 20 mmol/L — ABNORMAL LOW (ref 22–32)
Calcium: 9.3 mg/dL (ref 8.9–10.3)
Chloride: 107 mmol/L (ref 98–111)
Creatinine, Ser: 1.6 mg/dL — ABNORMAL HIGH (ref 0.61–1.24)
GFR calc Af Amer: 58 mL/min — ABNORMAL LOW (ref >60)
GFR calc non Af Amer: 50 mL/min — ABNORMAL LOW (ref >60)
Glucose, Bld: 99 mg/dL (ref 70–99)
Potassium: 3.7 mmol/L (ref 3.5–5.1)
Sodium: 139 mmol/L (ref 135–145)
Total Bilirubin: 0.7 mg/dL (ref 0.3–1.2)
Total Protein: 8.2 g/dL — ABNORMAL HIGH (ref 6.5–8.1)

## 2019-07-20 LAB — CBC WITH DIFFERENTIAL/PLATELET
Abs Immature Granulocytes: 0.04 10*3/uL (ref 0.00–0.07)
Basophils Absolute: 0 10*3/uL (ref 0.0–0.1)
Basophils Relative: 0 %
Eosinophils Absolute: 0.4 10*3/uL (ref 0.0–0.5)
Eosinophils Relative: 4 %
HCT: 40.8 % (ref 39.0–52.0)
Hemoglobin: 13.7 g/dL (ref 13.0–17.0)
Immature Granulocytes: 0 %
Lymphocytes Relative: 19 %
Lymphs Abs: 1.8 10*3/uL (ref 0.7–4.0)
MCH: 31.4 pg (ref 26.0–34.0)
MCHC: 33.6 g/dL (ref 30.0–36.0)
MCV: 93.6 fL (ref 80.0–100.0)
Monocytes Absolute: 0.9 10*3/uL (ref 0.1–1.0)
Monocytes Relative: 10 %
Neutro Abs: 6.2 10*3/uL (ref 1.7–7.7)
Neutrophils Relative %: 67 %
Platelets: 258 10*3/uL (ref 150–400)
RBC: 4.36 MIL/uL (ref 4.22–5.81)
RDW: 11.3 % — ABNORMAL LOW (ref 11.5–15.5)
WBC: 9.3 10*3/uL (ref 4.0–10.5)
nRBC: 0 % (ref 0.0–0.2)

## 2019-07-20 MED ORDER — AMLODIPINE BESYLATE 5 MG PO TABS: 5.0000 mg | ORAL_TABLET | Freq: Every day | ORAL | Status: DC

## 2019-07-20 MED ORDER — AMLODIPINE BESYLATE 2.5 MG PO TABS
2.5000 mg | ORAL_TABLET | Freq: Once | ORAL | Status: AC
  Administered 2019-07-20: 13:00:00 2.5 mg via ORAL
  Filled 2019-07-20: qty 1

## 2019-07-20 MED ORDER — AMLODIPINE BESYLATE 10 MG PO TABS
10.0000 mg | ORAL_TABLET | Freq: Every day | ORAL | Status: DC
  Administered 2019-07-21 – 2019-08-12 (×23): 10 mg via ORAL
  Filled 2019-07-20 (×2): qty 1
  Filled 2019-07-20: qty 2
  Filled 2019-07-20 (×20): qty 1

## 2019-07-20 MED ORDER — HYDRALAZINE HCL 25 MG PO TABS
25.0000 mg | ORAL_TABLET | Freq: Three times a day (TID) | ORAL | Status: DC
  Administered 2019-07-20 – 2019-08-12 (×69): 25 mg via ORAL
  Filled 2019-07-20 (×69): qty 1

## 2019-07-20 NOTE — Progress Notes (Signed)
Occupational Therapy Session Note  Patient Details  Name: Zachary Mccann MRN: [id number] Date of Birth: [birth year]  Today's Date: 07/20/2019 OT Individual Time: 1300-1400 OT Individual Time Calculation (min): 60 min    Short Term Goals: Week 1:  OT Short Term Goal 1 (Week 1): Pt will complete transfer to [facility] with LRAD with mod +1 OT Short Term Goal 2 (Week 1): Pt will don shirt with min A OT Short Term Goal 3 (Week 1): Pt will require no more than min verbal cueing for use of hemi techniques during dressing OT Short Term Goal 4 (Week 1): Pt will complete peri hygiene in standing with max A  Skilled Therapeutic Interventions/Progress Updates:     Pt received sitting up in his w/c agreeable to shower. Pt completed stand pivot with +2 max A for steadying/safety onto bari BSC in walk in shower. Wash mitt was donned on pt's RUE and he was provided [facility] assist to wash abdomen and under LUE. Pt completed all other UB bathing with his LUE with no assistance. Pt able to wash peri areas and upper/lower legs, requiring assistance to wash buttocks and feet. Pt stood with +2 steadying/safety, however requiring only mod A to power up, as peri hygiene was completed. Pt transferred back to w/c with mod +2. Pt donned shirt with heavy cueing for hemi technique and max A overall. Pt able to thread LLE into pants once RLE was completed. Pt made attempts to pull up in standing, requiring +2 assistance. Pt indicated he needed to use the bathroom and he used urinal in standing. Pt left sitting up with all needs met.   Therapy Documentation Precautions:  Precautions Precautions: Fall Restrictions Weight Bearing Restrictions: No   Therapy/Group: Individual Therapy  Curtis Sites 07/20/2019, 6:57 AM

## 2019-07-20 NOTE — Progress Notes (Signed)
Speech Language Pathology Daily Session Note  Patient Details  Name: Zachary Mccann MRN: [id number] Date of Birth: 1971-03-25  Today's Date: 07/20/2019 SLP Individual Time: 1000-1058 SLP Individual Time Calculation (min): 58 min  Short Term Goals: Week 1: SLP Short Term Goal 1 (Week 1): Patient will consume current diet without overt s/sx of aspiration with mod A verbal cues for use of swallowing compensatory strategies. SLP Short Term Goal 2 (Week 1): Patient will consume trials of thin liquids with no overt s/sx of aspiration. SLP Short Term Goal 3 (Week 1): Pt will express wants/needs with multimodal communication (yes/no gestures and writting at phrase level) with Min A verbal cues. SLP Short Term Goal 4 (Week 1): Pt will vocalize a vocalic sound with max A verbal cues.  Skilled Therapeutic Interventions: Skilled ST services focused on swallow and communication skills. Pt was consuming NTL via cup with handle upon entering, nurse present. Pt demonstrated anterior spillage and increase oral holding with supervision A verbal cues for awareness/management of spillage.Pt demonstrated delayed moderate strength cough, appeared d/t to secretions or possible residue, however pt was unable to cough on command. SLP facilitated oral motor movements and vowel/consonant oral positioning approximations on command. Pt demonstrated labial lateralization when smiling with noted asymmetry, open mouth and placed tongue forward when attempting to stick out tongue, however unable to protrude/raise/lower/lateralize tongue and create a labial seal.  Pt imitated positioning for vowel /a/ and /e/, however unable to approximate /o/, /b/ and /m/ positioning. Pt was unable to vocalize vocalic sound, although noted vocal cords appeared to adduct during reflexive cough. SLP provided min A for oral care completed by pt. SLP provided notebook to aid in written communication, pt demonstrated ability to write at phrase level with  mod A verbal cues for spelling errors. Pt was left in room with call bell within reach and chair alarm set. ST recommends to continue skilled ST services.      Pain Pain Assessment Pain Score: 0-No pain  Therapy/Group: Individual Therapy  Ajayla Iglesias 07/20/2019, 1:40 PM

## 2019-07-20 NOTE — IPOC Note (Signed)
Individualized overall Plan of Care [facility]) Patient Details Name: ANOOP BISSETT MRN: [id number] DOB: 1971-11-15  Admitting Diagnosis: <principal problem not specified>  Hospital Problems: Active Problems:   Left middle cerebral artery stroke (HCC)   Uncontrolled hypertension   Dysphagia, post-stroke   Cardiomegaly   Acute systolic congestive heart failure (HCC)     Functional Problem List: Nursing Bladder, Bowel, Endurance, Skin Integrity, Motor  PT Balance, Behavior, Edema, Endurance, Motor, Nutrition, Pain, Perception, Safety, Sensory, Skin Integrity  OT Balance, Perception, Cognition, Safety, Sensory, Edema, Endurance, Motor, Vision, Nutrition  SLP Motor, Nutrition  TR         Basic ADL's: OT Eating, Grooming, Bathing, Dressing, Toileting     Advanced  ADL's: OT       Transfers: PT Bed Mobility, Bed to Chair, Car, Furniture, Civil Service fast streamer, Research scientist (life sciences): PT Ambulation, Psychologist, prison and probation services, Stairs     Additional Impairments: OT Fuctional Use of Upper Extremity  SLP Swallowing, Communication expression    TR      Anticipated Outcomes Item Anticipated Outcome  Self Feeding set up assist  Swallowing  Min A   Basic self-care  min A  Toileting  min A   Bathroom Transfers min A  Bowel/Bladder  Pt will manage bowel and bladder with min assist  Transfers  min A using LRAD  Locomotion  min A using LRAD 20'  Communication  Mod A  Cognition     Pain  Pt will manage pain at 3 or less on a scale of 0-10.  Safety/Judgment  Pt will remain free of falls with injury while in rehab with min assist   Therapy Plan: PT Intensity: Minimum of 1-2 x/day ,45 to 90 minutes PT Frequency: 5 out of 7 days PT Duration Estimated Length of Stay: 3-4 weeks OT Intensity: Minimum of 1-2 x/day, 45 to 90 minutes OT Frequency: 5 out of 7 days OT Duration/Estimated Length of Stay: 3-4 weeks SLP Intensity: Minumum of 1-2 x/day, 30 to 90 minutes SLP Frequency: 3  to 5 out of 7 days SLP Duration/Estimated Length of Stay: 16-20 days    Team Interventions: Nursing Interventions Bladder Management, Bowel Management, Patient/Family Education, Disease Management/Prevention, Discharge Planning  PT interventions Ambulation/gait training, Discharge planning, Functional mobility training, Psychosocial support, Therapeutic Activities, Visual/perceptual remediation/compensation, Wheelchair propulsion/positioning, Therapeutic Exercise, Skin care/wound management, Neuromuscular re-education, Disease management/prevention, Warden/ranger, Cognitive remediation/compensation, DME/adaptive equipment instruction, Pain management, Splinting/orthotics, UE/LE Strength taining/ROM, Community reintegration, Development worker, international aid stimulation, Patient/family education, Museum/gallery curator, UE/LE Coordination activities  OT Interventions Warden/ranger, Discharge planning, Functional electrical stimulation, Pain management, Self Care/advanced ADL retraining, Therapeutic Activities, UE/LE Coordination activities, Visual/perceptual remediation/compensation, Therapeutic Exercise, Patient/family education, Functional mobility training, Cognitive remediation/compensation, Disease mangement/prevention, Firefighter, Fish farm manager, Neuromuscular re-education, Psychosocial support, Splinting/orthotics, UE/LE Strength taining/ROM, Wheelchair propulsion/positioning  SLP Interventions Speech/Language facilitation, Therapeutic Exercise, Patient/family education, Dysphagia/aspiration precaution training, Multimodal communication approach, Therapeutic Activities  TR Interventions    SW/CM Interventions Discharge Planning, Psychosocial Support, Patient/Family Education   Barriers to Discharge MD  Medical stability, Weight and Nutritional means  Nursing      PT Inaccessible home environment, Home environment access/layout 2 STE and 2 story home,  patient can live on first level with bed and bathroom.  OT      SLP      SW       Team Discharge Planning: Destination: PT-Home ,OT- Home , SLP-Home Projected Follow-up: PT-Home health PT, OT-  Home health OT, SLP-Home Health SLP  Projected Equipment Needs: PT-To be determined, Other (comment), OT- To be determined, SLP-To be determined Equipment Details: PT-Patient using RW and cane PTA., OT-  Patient/family involved in discharge planning: PT- Patient,  OT-Patient, SLP-Patient  MD ELOS: 20-24 days. Medical Rehab Prognosis:  Good Assessment: 48 year old right-handed male with history of left cerebellar CVA September 2019, CAD non-STEMI status post stenting May 2019 followed by Dr. Carolanne Grumbling on Effient in the past, hypertension, CKD stage III with creatinine 1.82-2.38 tobacco abuseas well as medical noncompliance.  Presented 07/10/2019 with aphasia, right facial droop and right side weakness. Urine drug screen positive marijuana, COVID negative, creatinine 2.38. Cranial CT scan showed areas of prior infarction in the superior aspect of the left cerebellum. No evidence of acute hemorrhage noted. Patient did not receive TPA. MRI/MRA showed occluded left internal carotid artery with reconstitution of the level of the left posterior communicating artery. Scattered punctate foci of cortical infarct involving the anterior left frontal lobe and left parietal lobe. Extensive left hemisphere acute subacute white matter infarcts involving the left centrum semiovale and corona radiata as well as border zone areas. Echocardiogram with ejection fraction of 40%. Neurology consulted presently on Plavix for CVA prophylaxis. Patient does have an allergy to aspirin. Full liquid diet with nectar thick consistency. Patient with resulting functional deficits with mobility, transfers, dysphagia, endurance, weakness.  Will set goals for MIn A with PT/OT/SLP.  Due to the current state of  emergency, patients may not be receiving their 3-hours of Medicare-mandated therapy.  See Team Conference Notes for weekly updates to the plan of care

## 2019-07-20 NOTE — Progress Notes (Signed)
[place] PHYSICAL MEDICINE & REHABILITATION PROGRESS NOTE  Subjective/Complaints: Patient seen laying in bed this morning.  He indicates he slept fairly overnight.  When asked about wanting sleep aid, he gives a "thumbs down".  ROS: Appears to deny CP, shortness of breath, nausea, vomiting, diarrhea.  Objective: Vital Signs: Blood pressure (!) 162/96, pulse 78, temperature 99.1 F (37.3 C), temperature source Oral, resp. rate 16, weight (!) 146.2 kg, SpO2 100 %. No results found. Recent Labs    07/20/19 0715  WBC 9.3  HGB 13.7  HCT 40.8  PLT 258   Recent Labs    07/20/19 0715  NA 139  K 3.7  CL 107  CO2 20*  GLUCOSE 99  BUN 17  CREATININE 1.60*  CALCIUM 9.3    Physical Exam: BP (!) 162/96   Pulse 78   Temp 99.1 F (37.3 C) (Oral)   Resp 16   Wt (!) 146.2 kg   SpO2 100%   BMI 43.72 kg/m  Constitutional: No distress . Vital signs reviewed.  HENT: Normocephalic.  Atraumatic. Eyes: EOMI.  No discharge. Cardiovascular: No JVD. Respiratory: Normal effort.  No stridor.   GI: Non-distended. Musc: No edema or tenderness in extremities. Musculoskeletal: Right-sided edema.  No tenderness Neurological:Alert Right facial weakness None verbal. Motor: LUE/LLE: Grossly 4/5 Right upper extremity: Shoulder abduction 2/5, elbow flexion/extension 3-/5, handgrip 2/5 with apraxia  Right lower extremity: 4-/5 grossly throughout Skin: Skin iswarmand dry.  Psychiatric:Flat  Assessment/Plan: 1. Functional deficits secondary to left brain infarct with history of CVA which require 3+ hours per day of interdisciplinary therapy in a comprehensive inpatient rehab setting.  Physiatrist is providing close team supervision and 24 hour management of active medical problems listed below.  Physiatrist and rehab team continue to assess barriers to discharge/monitor patient progress toward functional and medical goals  Care Tool:  Bathing    Body parts bathed by patient: Chest,  Left arm, Abdomen, Front perineal area, Left upper leg, Right upper leg, Face   Body parts bathed by helper: Right arm, Buttocks, Right lower leg, Left lower leg     Bathing assist Assist Level: Maximal Assistance - Patient 24 - 49%     Upper Body Dressing/Undressing Upper body dressing   What is the patient wearing?: Pull over shirt    Upper body assist Assist Level: Maximal Assistance - Patient 25 - 49%    Lower Body Dressing/Undressing Lower body dressing      What is the patient wearing?: Pants     Lower body assist Assist for lower body dressing: Maximal Assistance - Patient 25 - 49%     Toileting Toileting    Toileting assist Assist for toileting: 2 Helpers     Transfers Chair/bed transfer  Transfers assist  Chair/bed transfer activity did not occur: Safety/medical concerns(Decreased strength/balance)  Chair/bed transfer assist level: 2 Helpers     Locomotion Ambulation   Ambulation assist   Ambulation activity did not occur: Safety/medical concerns(decreased strength/balance/endurance)  Assist level: Moderate Assistance - Patient 50 - 74% Assistive device: Walker-rolling Max distance: 20   Walk 10 feet activity   Assist  Walk 10 feet activity did not occur: Safety/medical concerns(decreased strength/balance/endurance)  Assist level: Moderate Assistance - Patient - 50 - 74% Assistive device: Walker-rolling   Walk 50 feet activity   Assist Walk 50 feet with 2 turns activity did not occur: Safety/medical concerns         Walk 150 feet activity   Assist Walk 150 feet activity  did not occur: Safety/medical concerns         Walk 10 feet on uneven surface  activity   Assist Walk 10 feet on uneven surfaces activity did not occur: Safety/medical concerns         Wheelchair     Assist   Type of Wheelchair: Manual Wheelchair activity did not occur: Safety/medical concerns(decreased strength/balance/endurance)  Wheelchair  assist level: Maximal Assistance - Patient 25 - 49% Max wheelchair distance: 100    Wheelchair 50 feet with 2 turns activity    Assist    Wheelchair 50 feet with 2 turns activity did not occur: Safety/medical concerns(decreased strength/balance/endurance)   Assist Level: Maximal Assistance - Patient 25 - 49%   Wheelchair 150 feet activity     Assist Wheelchair 150 feet activity did not occur: Safety/medical concerns(decreased strength/balance/endurance)          Medical Problem List and Plan: 1.Right side weakness, aphasia, dysphasiasecondary to left MCA and ACA scattered infarct due to left ICA occlusion as well as history of previous stroke in the past and medical noncompliance  Continue CIR  PRAFO/WHO for RLE and RUE 2. Antithrombotics: -DVT/anticoagulation:SCDs -antiplatelet therapy: plavix 75mg daily 3. Pain Management:Tylenol as needed 4. Mood:Provide emotional support -antipsychotic agents: N/A 5. Neuropsych: This patientappears to be capable of making decisions on hisown behalf. 6. Skin/Wound Care:Routine skin checks 7. Fluids/Electrolytes/Nutrition:Routine in and outs 8. Post stroke dysphagia. Full liquid diet nectar thick consistency. Follow-up speech therapy -aspiration precautions  Advance diet as tolerated 9. History of CAD with myocardial infarction status post stenting. Continue Plavix. Patient is followed by Dr. Carlyle Dolly 10. Hypertension. Coreg 3.125 mg twice daily.   Hydralazine 10 3 times daily started on 7/31, increased to 25 3 times daily on 8/3  Norvasc 2.5 started on 8/1, increased to 5 on 8/2, increased to 7.5 on 8/3  Monitor with increased mobility 11. Hyperlipidemia. Lipitor 12. History of tobacco as well as marijuana use. Counseling 13. Systolic CHF  Echo with EF of 40%  Chest x-ray personally reviewed, suggestive of cardiomegaly  Filed Weights   07/20/19 0410  Weight:  (!) 146.2 kg   Await further readings 14.  CKD stage III  Creatinine 1.60 on 8/3  Continue to monitor 15.  Morbid obesity: Encouraged weight loss  LOS: 3 days A FACE TO FACE EVALUATION WAS PERFORMED  Ankit Lorie Phenix 07/20/2019, 10:39 AM

## 2019-07-20 NOTE — Care Management Note (Signed)
Inpatient Rehabilitation Center Individual Statement of Services  Patient Name:  Zachary Mccann  Date:  07/20/2019  Welcome to the [facility].  Our goal is to provide you with an individualized program based on your diagnosis and situation, designed to meet your specific needs.  With this comprehensive rehabilitation program, you will be expected to participate in at least 3 hours of rehabilitation therapies Monday-Friday, with modified therapy programming on the weekends.  Your rehabilitation program will include the following services:  Physical Therapy (PT), Occupational Therapy (OT), Speech Therapy (ST), 24 hour per day rehabilitation nursing, Neuropsychology, Case Management (Social Worker), Rehabilitation Medicine, Nutrition Services and Pharmacy Services  Weekly team conferences will be held on Wednesday to discuss your progress.  Your Social Worker will talk with you frequently to get your input and to update you on team discussions.  Team conferences with you and your family in attendance may also be held.  Expected length of stay: 3-4 weeks  Overall anticipated outcome: min assist level  Depending on your progress and recovery, your program may change. Your Social Worker will coordinate services and will keep you informed of any changes. Your Social Worker's name and contact numbers are listed  below.  The following services may also be recommended but are not provided by the [facility]:   [place] will be made to provide these services after discharge if needed.  Arrangements include referral to agencies that provide these services Your insurance has been verified to be:  Pending Medicaid Your primary doctor is:  Jeralyn Ruths  Pertinent information will be shared with your doctor and your insurance company.  Social Worker:  Ovidio Kin, [place] or [phone]  Information discussed with and copy given to patient by: Elease Hashimoto, 07/20/2019, 1:56 PM

## 2019-07-20 NOTE — Progress Notes (Signed)
Social Work Assessment and Plan   Patient Details  Name: Zachary Mccann MRN: [id number] Date of Birth: 1971/03/25  Today's Date: 07/20/2019  Problem List:  Patient Active Problem List   Diagnosis Date Noted  . 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 Chronic systolic congestive heart failure ([place])   . Morbid obesity ([place])   . Uncontrolled hypertension   . Dysphagia, post-stroke   . Cardiomegaly   . Acute systolic congestive heart failure ([place])   . Left middle cerebral artery stroke ([place]) 07/17/2019  . Cerebral embolism with cerebral infarction 07/11/2019  . Acute CVA (cerebrovascular accident) ([place]) 07/11/2019  . Slurred speech 07/10/2019  . Cardiomyopathy ([place]) 11/20/2018  . History of stroke 09/06/2018  . Tobacco use 09/06/2018  . History of non-ST elevation myocardial infarction (NSTEMI) 05/05/2018  . Severe Vitamin D deficiency 04/02/2018  . Community acquired pneumonia of left lower lobe of lung ([place]) 04/02/2018  . CKD (chronic kidney disease), stage III ([place]) = Secondary to Hypertension 04/02/2018  . Pneumonia 03/30/2018  . Metabolic acidosis 54/00/8676  . AKI (acute kidney injury) ([place]) 03/30/2018  . N&V (nausea and vomiting) 03/30/2018  . Essential hypertension 03/30/2018   Past Medical History:  Past Medical History:  Diagnosis Date  . CAD (coronary artery disease)    a. s/p NSTEMI in 04/2018 with DES to LCx.   [name] Hypertension   . Myocardial infarction ([place])   . Pneumonia   . Stroke [facility])    Past Surgical History:  Past Surgical History:  Procedure Laterality Date  . CORONARY STENT INTERVENTION N/A 05/05/2018   Procedure: CORONARY STENT INTERVENTION;  Surgeon: Leonie Man, MD;  Location: [place] CV LAB;  Service: Cardiovascular;  Laterality: N/A;  . LEFT HEART CATH AND CORONARY ANGIOGRAPHY N/A 05/05/2018   Procedure: LEFT HEART CATH AND CORONARY ANGIOGRAPHY;  Surgeon: Leonie Man,  MD;  Location: [place] CV [facility];  Service: Cardiovascular;  Laterality: N/A;  . TEE WITHOUT CARDIOVERSION N/A 09/08/2018   Procedure: TRANSESOPHAGEAL ECHOCARDIOGRAM (TEE);  Surgeon: Larey Dresser, MD;  Location: [facility] ENDOSCOPY;  Service: Cardiovascular;  Laterality: N/A;   Social History:  reports that he has been smoking cigarettes. He has been smoking about 0.25 packs per day. He has never used smokeless tobacco. He reports previous alcohol use. He reports previous drug use.  Family / Support Systems Marital Status: Single Patient Roles: Partner, Parent, Caregiver Spouse/Significant Other: [name]  [phone] Other Supports: [name]-brother [phone]-cell  [name]-[phone] Anticipated Caregiver: [name] Ability/Limitations of Caregiver: She is in good health but does take care of their 6 adopted children-four of which are disabled/handicapped Caregiver Availability: 24/7 Family Dynamics: Close knit family-[name] is committed to pt and willing to assist him at DC from here. They have some extended family and friends who are supportive and involved.  Social History Preferred language: English Religion:  Cultural Background: No issues Education: HS Read: Yes Write: Yes Employment Status: Unemployed Date Retired/Disabled/Unemployed: 2019 Legal History/Current Legal Issues: No issues Guardian/Conservator: None-according to MD pt is capable but not able to communicate due to his stroke, will look toward his fiance-[name] to make any decisions along with his brother since he is next of kin   Abuse/Neglect Abuse/Neglect Assessment Can Be Completed: Yes Physical Abuse: Denies Verbal Abuse: Denies Sexual Abuse: Denies Exploitation of patient/patient's resources: Denies Self-Neglect: Denies  Emotional Status Pt's affect, behavior and adjustment status: Pt wants to do his best and  recover from this stroke. He had a stroke in 10/2018 and was doing well and  could take care of himself. He did have vision issues and was not driving prior to admission. [name] voiced he was doing well until this and the only reason he stopped taking his meds was due to the cost. Recent Psychosocial Issues: other health issues-hx-CVA, CAD etc Psychiatric History: No history deferred depression screen due to not able to communicate due to his stroke, will have neruo-psych see when appropriate and able to participate. He is young and would benefit from seeing while here. Substance Abuse History: History of ETOH and drug history-was smoking-tobacco prior to admission-plans to quit now.  Patient / Family Perceptions, Expectations & Goals Pt/Family understanding of illness & functional limitations: Pt is aware he has had another stroke and is aware of his deficits. [name] is able to explain his deficits and is so glad to be here and visit him. She plans to be here daily to see him in therapies and watch his progress while here Premorbid pt/family roles/activities: father, partner, brother, friend, etc Anticipated changes in roles/activities/participation: resume Pt/family expectations/goals: Pt gives the thumbs up sign when social worker talking and discussing the need for SSD and Medicaid. [name] states: " I hope he improves and can do some for himself, I will assist but need him moving some."  [employer]: Other (Comment)(Applied for SSD) Premorbid Home Care/DME Agencies: Other (Comment)(has rw, cane and bsc from past admit) Transportation available at discharge: [employer] referrals recommended: Neuropsychology, Support group (specify)  Discharge Planning Living Arrangements: Spouse/significant other, Children Support Systems: Spouse/significant other, Children, Other relatives, Friends/neighbors Type of Residence: Private residence Insurance Resources: Careers adviser for [employer]) Financial Resources: Family Support(applying for  [employer]) Financial Screen Referred: Yes Living Expenses: Rent Money Management: Significant Other Does the patient have any problems obtaining your medications?: Yes (Describe)(uninsured) Home Management: [name] Patient/Family Preliminary Plans: Return home with [name] and the children. [name] does not work but cares for the six adopted children-one she provides physical care, he is autistic. [name] aware he will need 24 hr care at discharge from here. She plans to be here daily to see him in therapies to watch his progress and learn his care. Social Work Anticipated Follow Up Needs: HH/OP, Support Group  Clinical Impression Pleasant gentleman who is motivated to be here and wanting to make progress while here and recover from this stroke. He recovered from his last stroke in 10/2018. His fiance-[name] is committed to him and will assist with his care at discharge. She plans to be here to see him in therapies. Pt would benefit from seeing neuro-psych while here when appropriate.  Lucy Chris 07/20/2019, 1:51 PM

## 2019-07-20 NOTE — Progress Notes (Signed)
Inpatient Rehabilitation  Patient information reviewed and entered into eRehab system by Kodee Drury M. Shaneca Orne, M.A., CCC/SLP, PPS Coordinator.  Information including medical coding, functional ability and quality indicators will be reviewed and updated through discharge.    

## 2019-07-20 NOTE — Progress Notes (Addendum)
Physical Therapy Session Note  Patient Details  Name: Zachary Mccann MRN: [id number] Date of Birth: 11-Feb-1971  Today's Date: 07/20/2019 PT Individual Time: 0900-1000 PT Individual Time Calculation (min): 60 min   Short Term Goals: Week 1:  PT Short Term Goal 1 (Week 1): Patient will perform rolling in bed with min A with use of bed rails. PT Short Term Goal 2 (Week 1): Patient will perform supine to sit and sit to supine with mod A. PT Short Term Goal 3 (Week 1): Patient will perform sit<> stand with max A of 1 person using LRAD. PT Short Term Goal 3 - Progress (Week 1): Met PT Short Term Goal 4 (Week 1): Patient will perform basic transfers with max A of 1 person. PT Short Term Goal 4 - Progress (Week 1): Met PT Short Term Goal 5 (Week 1): Patient will initiate gait training. PT Short Term Goal 5 - Progress (Week 1): Met  Skilled Therapeutic Interventions/Progress Updates:   Pt resting in bed.  He used thumbs up/down to communitcate; no c/o of pain.  Pt donned disposable pants in bed with mod assist, set up for bridging.  Rolling R with cues and supervision; to sitting with mod assist for trunk elevation from R side.  Pt sat EOB x 5 minutes with occasional cues for midline orientation.  Slide board transfer slightly downhill to L with +2 assistance.  neuromuscular re-education in supine : R hip flexion/extension; seated in w/c at [facility] at 60 cm/sec for alternating reciprocal movement x 25 cycles, x 35 cycles; with bright cone for visual target, R long arc quad knee extension with ankle pumps at end range for moving in/out of synergy.  Sit> stand from w/c with +2. Possible apraxia limiting transition.  Gait training iwht bari RW, R hand splint, x 20' wht wc follow for safety, max cues for R longer step length, R knee stability, upright stance, mod> min assist.  Pt needed cues for route finding to return to room, due to R inattention.  At end of session.  Pt resting in w/c  with needs at hand and seat belt alarm set.     Therapy Documentation Precautions:  Precautions Precautions: Fall Restrictions Weight Bearing Restrictions: No    Therapy/Group: Individual Therapy  Erric Machnik 07/20/2019, 10:07 AM

## 2019-07-21 ENCOUNTER — Inpatient Hospital Stay (HOSPITAL_COMMUNITY): Payer: Self-pay | Admitting: Physical Therapy

## 2019-07-21 ENCOUNTER — Inpatient Hospital Stay (HOSPITAL_COMMUNITY): Payer: Self-pay | Admitting: Occupational Therapy

## 2019-07-21 ENCOUNTER — Inpatient Hospital Stay (HOSPITAL_COMMUNITY): Payer: Self-pay

## 2019-07-21 DIAGNOSIS — G479 Sleep disorder, unspecified: Secondary | ICD-10-CM

## 2019-07-21 MED ORDER — MELATONIN 3 MG PO TABS
1.5000 mg | ORAL_TABLET | Freq: Every day | ORAL | Status: DC
  Administered 2019-07-21 – 2019-07-22 (×2): 1.5 mg via ORAL
  Filled 2019-07-21 (×3): qty 0.5

## 2019-07-21 MED ORDER — NON FORMULARY: 1.5000 mg | Freq: Every day | Status: DC

## 2019-07-21 NOTE — Progress Notes (Signed)
Occupational Therapy Session Note  Patient Details  Name: Zachary Mccann MRN: [id number] Date of Birth: 18-Jan-1971  Today's Date: 07/21/2019 OT Individual Time: 4818-5631 OT Individual Time Calculation (min): 72 min    Short Term Goals: Week 1:  OT Short Term Goal 1 (Week 1): Pt will complete transfer to [facility] with LRAD with mod +1 OT Short Term Goal 2 (Week 1): Pt will don shirt with min A OT Short Term Goal 3 (Week 1): Pt will require no more than min verbal cueing for use of hemi techniques during dressing OT Short Term Goal 4 (Week 1): Pt will complete peri hygiene in standing with max A  Skilled Therapeutic Interventions/Progress Updates:    Pt completed shower and dressing during session.  Mod assist for sidelying to sit EOB with max assist for stand pivot transfers to the wheelchair, shower, as well as back to the wheelchair.  He was able to integrate the wash mitt for the RUE to complete bathing of 75% of his UB and 25% of his LB with mod facilitation.  He was able to wash the rest with max assist for sit to stand and integration of the LUE to wash his peri area and RUE.  He needed assist to wash the buttocks and the feet.  For dressing he needed max assist level for donning all clothing following hemi dressing techniques.  Finished session with pt up in the wheelchair with mod assist for oral care using suction toothbrush.  Call button and phone in reach with safety alarm belt in place.    Therapy Documentation Precautions:  Precautions Precautions: Fall Restrictions Weight Bearing Restrictions: No  Pain: Pain Assessment Pain Scale: Faces Faces Pain Scale: No hurt ADL: See Care Tool for some details of ADL  Therapy/Group: Individual Therapy  Doron Shake OTR/L 07/21/2019, 12:38 PM

## 2019-07-21 NOTE — Progress Notes (Signed)
Physical Therapy Session Note  Patient Details  Name: Zachary Mccann MRN: [id number] Date of Birth: 04/08/1971  Today's Date: 07/21/2019 PT Individual Time: 2595-6387 and 5643-3295 PT Individual Time Calculation (min): 27 min and 75 min  Short Term Goals: Week 1:  PT Short Term Goal 1 (Week 1): Patient will perform rolling in bed with min A with use of bed rails. PT Short Term Goal 2 (Week 1): Patient will perform supine to sit and sit to supine with mod A. PT Short Term Goal 3 (Week 1): Patient will perform sit<> stand with max A of 1 person using LRAD. PT Short Term Goal 3 - Progress (Week 1): Met PT Short Term Goal 4 (Week 1): Patient will perform basic transfers with max A of 1 person. PT Short Term Goal 4 - Progress (Week 1): Met PT Short Term Goal 5 (Week 1): Patient will initiate gait training. PT Short Term Goal 5 - Progress (Week 1): Met   Skilled Therapeutic Interventions/Progress Updates:    Session 1: Pt received sitting in w/c and via thumbs up/down for yes/no pt agreeable to therapy session.  Transported to/from gym in w/c. Therapist applied R UE orthotic on RW. Sit>stand w/c>RW with +2 mod assist for lifting. Ambulated 43f using RW with mod assist of 1 person for balance, w/c follow for safety, max cuing throughout for increased R LE step length and foot clearance - no knee instability noticed during stance phase. Performed repeated sit<>stand w/c<>RW with mod assist of 1 for lifting/lowering with focus on anterior trunk lean follow by trunk/hip extension to maintain upright with manual facilitation and cuing as well as proper UE placement on/off RW - pt continues to demonstrates poor final eccentric control. Transported back to room and left sitting in w/c with needs in reach, seat belt alarm on, and R w/c arm tray donned.   Session 2: Pt received supine in bed and agreeable to therapy session via thumbs up/down for yes/no. Supine>sit, HOB partially elevated and heavily relying  on bedrails with mod assist for trunk upright. Sit>stand EOB>RW with max A of 1 and mod assist of 2nd for lifting as pt demonstrates poor carryover of AM session education on anterior trunk lean and hip/knee extension. Stand pivot EOB>w/c using RW with mod assist of 1 for balance and +2 present for safety - max cuing for sequencing. Transported to/from gym in w/c. Stand pivot w/c>EOM using RW with mod/max assist of 1 and 1x mod assist of 2nd person due to LOB. Pt continues to require max cuing for proper UE hand placement for safety with AD. Sit<>stands EOM<>RW varying from mod assist of 1 to mod/max assist of 1-2 people - continues to require max cuing for sequencing. Performed R LE NMR task of repeated R LE step up/down on 4" step, B UE support on RW, with min/mod assist for balance and max progressed to min manual facilitation for increased L lateral weight shift for improved R foot clearance. Performed repeated L LE step up/down on 4" step, B UE support on RW, with pt demonstrating forward and right trunk flexion when stepping up despite max manual facilitation for increased trunk/hip extension - min assist for balance. Standing balance task with R UE support on RW and min assist with occasional mod assist due to R lateral lean/LOB while taking horseshoes from top of mirror and relocating them onto the walker and pt tolerating standing for ~258mutes. Ambulated 3336fsing RW with min/mod assist for balance, +2 w/c follow  for safety, and manual facilitation for increased L lateral weight shift to increase R LE foot clearance and step length. Pt reporting B LE fatigue and requesting to perform sitting tasks. Performed seated R UE NMR of repeatedly bringing cup up to mouth and down to lap, isolated forearm pronation/supination with manual facilitation for full ROM, and PROM into shoulder external rotation and flexion with pt reporting pain/discomfort with increased ROM but with increased reps able to achieve more  range with less indications of pain. Pt educated on L hemi-technique w/c propulsion with max cuing and max assist for propulsion ~54f as pt able to perform L HB movements but lacking adequate strength to move w/c forward. Transported back to room in w/c and left sitting up in w/c with needs in reach and seat belt alarm on.   Therapy Documentation Precautions:  Precautions Precautions: Fall Restrictions Weight Bearing Restrictions: No  Pain: Session 1: No indications of pain during session.  Session 2: Indicated pain in R shoulder during shoulder flexion >90degrees - decreased speed of ROM and gradually increased range per pt tolerance.    Therapy/Group: Individual Therapy  CTawana Scale PT, DPT 07/21/2019, 3:06 PM

## 2019-07-21 NOTE — Progress Notes (Signed)
Speech Language Pathology Daily Session Note  Patient Details  Name: Zachary Mccann MRN: [id number] Date of Birth: [birth year]  Today's Date: 07/21/2019 SLP Individual Time: 8250-5397 SLP Individual Time Calculation (min): 43 min  Short Term Goals: Week 1: SLP Short Term Goal 1 (Week 1): Patient will consume current diet without overt s/sx of aspiration with mod A verbal cues for use of swallowing compensatory strategies. SLP Short Term Goal 2 (Week 1): Patient will consume trials of thin liquids with no overt s/sx of aspiration. SLP Short Term Goal 3 (Week 1): Pt will express wants/needs with multimodal communication (yes/no gestures and writting at phrase level) with Min A verbal cues. SLP Short Term Goal 4 (Week 1): Pt will vocalize a vocalic sound with max A verbal cues.  Skilled Therapeutic Interventions: Skilled ST services focused on swallow and language skills. SLP facilitated written expression at phrase and some sentence level in which pt wrote a logical conclusion to a short (2 sentence ) story, pt demonstrated auditory comprehension providing logical conclusion and required min A verbal cues for spelling errors and clarification due to poor legibility. SLP also facilitated motor movements of vowel approximations /a/ x3 ,  /e/ with limited laterization and /uh/ pressing down pt demonstrated ability to pass air, however no vocalization. Pt demonstrated ability to protrude tongue pas lower denition in 1 out 10 trials. SLP assisted with oral care prior to thin via spoon trials, pt demonstrated initial piecemeal swallow (x2 swallow) fading to x1 swallow to clear bolus ( 2/3 way through trials.) Pt demonstrated x1 appeared productive cough on 10 trials of thin via spoon with minimal right anterior spillage. SLP provided education on 7/28 MBS pertaining to swallow deficits and treatment plan, pt provided thumbs up to information. Pt was left in room with call bell within reach and bed/chair alarm  set. ST recommends to continue skilled ST services.   Pain Pain Assessment Pain Scale: Faces Pain Score: 0-No pain Faces Pain Scale: No hurt  Therapy/Group: Individual Therapy  Yena Tisby  [facility] 07/21/2019, 1:02 PM

## 2019-07-21 NOTE — Progress Notes (Signed)
[facility] PHYSICAL MEDICINE & REHABILITATION PROGRESS NOTE  Subjective/Complaints: Patient seen laying in bed this A.  He indicated he did not sleep well overnight, but appears agreeable to sleep aid now.   ROS: Appears to deny CP, shortness of breath, nausea, vomiting, diarrhea.  Objective: Vital Signs: Blood pressure (!) 126/96, pulse 99, temperature 98.8 F (37.1 C), resp. rate 16, weight (!) 148.5 kg, SpO2 100 %. No results found. Recent Labs    07/20/19 0715  WBC 9.3  HGB 13.7  HCT 40.8  PLT 258   Recent Labs    07/20/19 0715  NA 139  K 3.7  CL 107  CO2 20*  GLUCOSE 99  BUN 17  CREATININE 1.60*  CALCIUM 9.3    Physical Exam: BP (!) 126/96 (BP Location: Right Arm)   Pulse 99   Temp 98.8 F (37.1 C)   Resp 16   Wt (!) 148.5 kg   SpO2 100%   BMI 44.40 kg/m  Constitutional: No distress . Vital signs reviewed. HENT: Normocephalic.  Atraumatic. Eyes: EOMI. No discharge. Cardiovascular: No JVD. Respiratory: Normal effort.  No stridor. GI: Non-distended. Skin: Warm and dry.  Intact. Psych: Normal mood.  Normal behavior. Musc: No edema.  No tenderness. Neurological:Alert Right facial weakness Nonverbal. Motor: LUE/LLE: Grossly 4/5 Right upper extremity: Shoulder abduction 2/5, elbow flexion/extension 3-/5, handgrip 2/5 with apraxia, stable Right lower extremity: 4/5 grossly throughout Skin: Skin iswarmand dry.  Psychiatric:Flat  Assessment/Plan: 1. Functional deficits secondary to left brain infarct with history of CVA which require 3+ hours per day of interdisciplinary therapy in a comprehensive inpatient rehab setting.  Physiatrist is providing close team supervision and 24 hour management of active medical problems listed below.  Physiatrist and rehab team continue to assess barriers to discharge/monitor patient progress toward functional and medical goals  Care Tool:  Bathing    Body parts bathed by patient: Chest, Left arm, Abdomen, Front  perineal area, Left upper leg, Right upper leg, Face   Body parts bathed by helper: Right arm, Buttocks, Right lower leg, Left lower leg     Bathing assist Assist Level: Maximal Assistance - Patient 24 - 49%     Upper Body Dressing/Undressing Upper body dressing   What is the patient wearing?: Pull over shirt    Upper body assist Assist Level: Maximal Assistance - Patient 25 - 49%    Lower Body Dressing/Undressing Lower body dressing      What is the patient wearing?: Pants     Lower body assist Assist for lower body dressing: Maximal Assistance - Patient 25 - 49%     Toileting Toileting    Toileting assist Assist for toileting: 2 Helpers     Transfers Chair/bed transfer  Transfers assist  Chair/bed transfer activity did not occur: Safety/medical concerns(Decreased strength/balance)  Chair/bed transfer assist level: 2 Helpers     Locomotion Ambulation   Ambulation assist   Ambulation activity did not occur: Safety/medical concerns(decreased strength/balance/endurance)  Assist level: Moderate Assistance - Patient 50 - 74% Assistive device: Walker-rolling Max distance: 20   Walk 10 feet activity   Assist  Walk 10 feet activity did not occur: Safety/medical concerns(decreased strength/balance/endurance)  Assist level: Moderate Assistance - Patient - 50 - 74% Assistive device: Walker-rolling   Walk 50 feet activity   Assist Walk 50 feet with 2 turns activity did not occur: Safety/medical concerns         Walk 150 feet activity   Assist Walk 150 feet activity did not  occur: Safety/medical concerns         Walk 10 feet on uneven surface  activity   Assist Walk 10 feet on uneven surfaces activity did not occur: Safety/medical concerns         Wheelchair     Assist   Type of Wheelchair: Manual Wheelchair activity did not occur: Safety/medical concerns(decreased strength/balance/endurance)  Wheelchair assist level: Maximal  Assistance - Patient 25 - 49% Max wheelchair distance: 100    Wheelchair 50 feet with 2 turns activity    Assist    Wheelchair 50 feet with 2 turns activity did not occur: Safety/medical concerns(decreased strength/balance/endurance)   Assist Level: Maximal Assistance - Patient 25 - 49%   Wheelchair 150 feet activity     Assist Wheelchair 150 feet activity did not occur: Safety/medical concerns(decreased strength/balance/endurance)          Medical Problem List and Plan: 1.Right side weakness, aphasia, dysphasiasecondary to left MCA and ACA scattered infarct due to left ICA occlusion as well as history of previous stroke in the past and medical noncompliance  Continue CIR  PRAFO/WHO for RLE and RUE 2. Antithrombotics: -DVT/anticoagulation:SCDs -antiplatelet therapy: plavix 75mg daily 3. Pain Management:Tylenol as needed 4. Mood:Provide emotional support -antipsychotic agents: N/A 5. Neuropsych: This patientappears to be capable of making decisions on hisown behalf. 6. Skin/Wound Care:Routine skin checks 7. Fluids/Electrolytes/Nutrition:Routine in and outs 8. Post stroke dysphagia. Full liquid diet nectar thick consistency. Follow-up speech therapy -aspiration precautions  Advance diet as tolerated 9. History of CAD with myocardial infarction status post stenting. Continue Plavix. Patient is followed by Dr. Dina Rich 10. Hypertension. Coreg 3.125 mg twice daily.   Hydralazine 10 3 times daily started on 7/31, increased to 25 3 times daily on 8/3  Norvasc 2.5 started on 8/1, increased to 5 on 8/2, increased to 7.5 on 8/3  Labile on 8/4, with elevated diastolic readings  Monitor with increased mobility 11. Hyperlipidemia. Lipitor 12. History of tobacco as well as marijuana use. Counseling 13. Systolic CHF  Echo with EF of 03%  Chest x-ray personally reviewed, suggestive of cardiomegaly  Filed  Weights   07/20/19 0410 07/21/19 0524  Weight: (!) 146.2 kg (!) 148.5 kg   ?Reliability on 8/4 14.  CKD stage III  Creatinine 1.60 on 8/3  Continue to monitor 15.  Morbid obesity: Encouraged weight loss 16. Sleep disturbance  Melatonin started on 8/4  LOS: 4 days A FACE TO FACE EVALUATION WAS PERFORMED  Ankit Karis Juba 07/21/2019, 11:09 AM

## 2019-07-22 ENCOUNTER — Inpatient Hospital Stay (HOSPITAL_COMMUNITY): Payer: Self-pay | Admitting: Occupational Therapy

## 2019-07-22 ENCOUNTER — Inpatient Hospital Stay (HOSPITAL_COMMUNITY): Payer: Self-pay

## 2019-07-22 ENCOUNTER — Inpatient Hospital Stay (HOSPITAL_COMMUNITY): Payer: Self-pay | Admitting: Speech Pathology

## 2019-07-22 MED ORDER — POLYETHYLENE GLYCOL 3350 17 G PO PACK
17.0000 g | PACK | Freq: Every day | ORAL | Status: DC
  Administered 2019-07-22 – 2019-07-25 (×4): 17 g via ORAL
  Filled 2019-07-22 (×6): qty 1

## 2019-07-22 MED ORDER — FUROSEMIDE 20 MG PO TABS
20.0000 mg | ORAL_TABLET | Freq: Every day | ORAL | Status: DC
  Administered 2019-07-22 – 2019-07-24 (×3): 20 mg via ORAL
  Filled 2019-07-22 (×3): qty 1

## 2019-07-22 NOTE — Progress Notes (Signed)
Physical Therapy Session Note  Patient Details  Name: Zachary Mccann MRN: [id number] Date of Birth: 02/06/1971  Today's Date: 07/22/2019 PT Individual Time: 0800-0900, 1140-1205 PT Individual Time Calculation (min): 60, 25 min ,   Short Term Goals: Week 1:  PT Short Term Goal 1 (Week 1): Patient will perform rolling in bed with min A with use of bed rails. PT Short Term Goal 2 (Week 1): Patient will perform supine to sit and sit to supine with mod A. PT Short Term Goal 3 (Week 1): Patient will perform sit<> stand with max A of 1 person using LRAD. PT Short Term Goal 3 - Progress (Week 1): Met PT Short Term Goal 4 (Week 1): Patient will perform basic transfers with max A of 1 person. PT Short Term Goal 4 - Progress (Week 1): Met PT Short Term Goal 5 (Week 1): Patient will initiate gait training. PT Short Term Goal 5 - Progress (Week 1): Met  Skilled Therapeutic Interventions/Progress Updates:  tx 1:  Pt resting in bed.  Using thumbs up/down, he indicated that he had no pain.  neuromuscular re-education via multimodal cues for RLE, in supine: Gentle hip and hip PROM in all planes bil hip internal rotation R hip abduction/adduction to slide foot off/on bed R heel slides Ankle DF/PF bil bridging   Bed mobility training for rolling L/R using hemi techniques, without railings in flat bed, CGA to R, mod assist to L.  PT donned pants on pt in supine, with pt assisting by pulling up pants in front.   Sit> stand from raised bed to RW, min assist.  +2 for safety.  PT finished pulling pants over hips in standing.  Stand pivot with RW to w/c to L, mod assist and mod cues.   Gait training with Rw with R hand splint, w/c following for safety, min assist x 30' straight trajectory with multimodal cues for longer R step length,  During transitional movements with RW to turn and sit, pt needs mod assist for balance and mod cues for technique.  At end of session, pt resting in w/c with seat belt  alarm set and needs at hand.  tx 2:  Pt resting in w/c.  Using thumbs up/down, he denied pain.  Stand pivot transfer with RW wc> firm mat to R, with min/mod assist.  Transfer training in sitting, for shifting wt forward to elevate hips and place R/L, using visual targets on mat.  Mod assist.    Pt transferred back to w/c to L with improved transitional movements excepts backwards stepping with R foot.  Pt has difficulty with full wt shift to L in order to flex hip and especially knee to lift R foot off of floor.  LOB at very end of transfer, backwards into chair.  At end of session, pt resting in w/c with needs at hand, fiance in room with him.  Seat belt alarm set.     Therapy Documentation Precautions:  Precautions Precautions: Fall Restrictions Weight Bearing Restrictions: No     Therapy/Group: Individual Therapy  Graci Hulce 07/22/2019, 12:13 PM

## 2019-07-22 NOTE — Progress Notes (Signed)
Speech Language Pathology Daily Session Note  Patient Details  Name: Zachary Mccann MRN: [id number] Date of Birth: 03-18-1971  Today's Date: 07/22/2019 SLP Individual Time: 5883-2549 SLP Individual Time Calculation (min): 43 min  Short Term Goals: Week 1: SLP Short Term Goal 1 (Week 1): Patient will consume current diet without overt s/sx of aspiration with mod A verbal cues for use of swallowing compensatory strategies. SLP Short Term Goal 2 (Week 1): Patient will consume trials of thin liquids with no overt s/sx of aspiration. SLP Short Term Goal 3 (Week 1): Pt will express wants/needs with multimodal communication (yes/no gestures and writting at phrase level) with Min A verbal cues. SLP Short Term Goal 4 (Week 1): Pt will vocalize a vocalic sound with max A verbal cues.  Skilled Therapeutic Interventions:  Pt was seen for skilled ST targeting goals for communication and dysphagia.  SLP facilitated the session with trials of ice chips, thin liquids via teaspoon, and cup sips of thin liquids.  Pt demonstrated the best oral control of boluses and fewest s/s of aspiration with thin liquids via teaspoon and small cup sips.  Trials of ice chips consistently elicited immediate coughing which SLP suspects to be related to difficulty manipulating mixed liquid and solid consistencies in the setting of profound oral motor weakness.  Recommend that pt remain on his currently prescribed due to ongoing risk for aspiration with thin liquid consistencies.  Pt was able to produce /m/ x2  with max assist verbal and visual cues (including mirror for feedback).  /ah/ and /ee/ were attempted with no success despite max assist verbal and visual cues.  Pt was efficient at conveying his needs and wants regarding discharge planning with pen and paper.  Pt stated that he was "Ready to go home."  SLP provided encouragement and validation of pt's emotions.  Pt was left in wheelchair with call bell within reach.  Continue per  current plan of care.    Pain Pain Assessment Pain Scale: 0-10 Pain Score: 0-No pain  Therapy/Group: Individual Therapy  Davona Kinoshita, Selinda Orion 07/22/2019, 12:44 PM

## 2019-07-22 NOTE — Progress Notes (Signed)
Occupational Therapy Session Note  Patient Details  Name: Zachary Mccann MRN: [id number] Date of Birth: [birth year]  Today's Date: 07/22/2019 OT Individual Time: 1422-1530 OT Individual Time Calculation (min): 68 min    Short Term Goals: Week 1:  OT Short Term Goal 1 (Week 1): Pt will complete transfer to [facility] with LRAD with mod +1 OT Short Term Goal 2 (Week 1): Pt will don shirt with min A OT Short Term Goal 3 (Week 1): Pt will require no more than min verbal cueing for use of hemi techniques during dressing OT Short Term Goal 4 (Week 1): Pt will complete peri hygiene in standing with max A  Skilled Therapeutic Interventions/Progress Updates:    Pt completed stand pivot transfer with mod assist to the wide 3:1 over the toilet to start session.  Max assist in standing for clothing management.  He was unsuccessful in attempts to complete a bowel movement or urinate.  He then transferred over to the shower with mod assist stand pivot using the RW for support with hand splint on the right.  Mod instructional cueing for sequencing walker and stepping.  He worked on bathing with mod assist in sitting.  Wash mitt was utilized on the RUE to use for bathing with overall mod facilitation.  He needed assist with washing of buttocks in standing (which was completed after toileting) and lower legs and back in sitting.  He transferred out to the wheelchair stand pivot with mod assist using the RW for support for dressing tasks.  He completed UB dressing with max assist as well as LB dressing.  Increased difficulty processing through donning pullover shirt following hemi techniques.  Finished session with transfer back to the bed to rest with mod assist for pivot in standing and min assist for transition to supine.  Call button and phone in reach with safety belt in place.    Therapy Documentation Precautions:  Precautions Precautions: Fall Restrictions Weight Bearing Restrictions: No   Pain: Pain  Assessment Pain Scale: Faces Pain Score: 0-No pain ADL: See Care Tool Section for some details of ADL  Therapy/Group: Individual Therapy  Ata Pecha OTR/L 07/22/2019, 3:49 PM

## 2019-07-22 NOTE — Progress Notes (Signed)
[facility] PHYSICAL MEDICINE & REHABILITATION PROGRESS NOTE  Subjective/Complaints: Patient seen laying in bed this morning.  He engages minimally.  No reported issues overnight.   ROS: Not answering questions this AM, even with thumbs up/thumbs down responses.  Objective: Vital Signs: Blood pressure 131/74, pulse 99, temperature 98.8 F (37.1 C), resp. rate 16, weight (!) 149.2 kg, SpO2 100 %. No results found. Recent Labs    07/20/19 0715  WBC 9.3  HGB 13.7  HCT 40.8  PLT 258   Recent Labs    07/20/19 0715  NA 139  K 3.7  CL 107  CO2 20*  GLUCOSE 99  BUN 17  CREATININE 1.60*  CALCIUM 9.3    Physical Exam: BP 131/74   Pulse 99   Temp 98.8 F (37.1 C)   Resp 16   Wt (!) 149.2 kg   SpO2 100%   BMI 44.61 kg/m  Constitutional: No distress . Vital signs reviewed. HENT: Normocephalic.  Atraumatic. Eyes: EOMI. No discharge. Cardiovascular: No JVD. Respiratory: Normal effort.  No stridor. GI: Non-distended. Skin: Warm and dry.  Intact. Psych: Normal mood.  Normal behavior. Musc: No edema.  No tenderness. Neurological:Alert  Right facial weakness Nonverbal. Motor: LUE/LLE: Grossly 4/5 Right upper extremity: Shoulder abduction 2/5, elbow flexion/extension 3-/5, handgrip 2/5 with apraxia, appears stable Right lower extremity: 4/5 grossly throughout, appears stable Skin: Skin iswarmand dry.  Psychiatric:Flat  Assessment/Plan: 1. Functional deficits secondary to left brain infarct with history of CVA which require 3+ hours per day of interdisciplinary therapy in a comprehensive inpatient rehab setting.  Physiatrist is providing close team supervision and 24 hour management of active medical problems listed below.  Physiatrist and rehab team continue to assess barriers to discharge/monitor patient progress toward functional and medical goals  Care Tool:  Bathing    Body parts bathed by patient: Chest, Abdomen, Front perineal area, Left upper leg, Right  upper leg, Face, Right arm   Body parts bathed by helper: Buttocks, Left arm     Bathing assist Assist Level: Maximal Assistance - Patient 24 - 49%     Upper Body Dressing/Undressing Upper body dressing   What is the patient wearing?: Pull over shirt    Upper body assist Assist Level: Maximal Assistance - Patient 25 - 49%    Lower Body Dressing/Undressing Lower body dressing      What is the patient wearing?: Pants, Incontinence brief     Lower body assist Assist for lower body dressing: 2 Helpers     Toileting Toileting    Toileting assist Assist for toileting: 2 Helpers     Transfers Chair/bed transfer  Transfers assist  Chair/bed transfer activity did not occur: Safety/medical concerns(Decreased strength/balance)  Chair/bed transfer assist level: Moderate Assistance - Patient 50 - 74%     Locomotion Ambulation   Ambulation assist   Ambulation activity did not occur: Safety/medical concerns(decreased strength/balance/endurance)  Assist level: Minimal Assistance - Patient > 75% Assistive device: Walker-rolling Max distance: 30   Walk 10 feet activity   Assist  Walk 10 feet activity did not occur: Safety/medical concerns(decreased strength/balance/endurance)  Assist level: Minimal Assistance - Patient > 75% Assistive device: Walker-rolling   Walk 50 feet activity   Assist Walk 50 feet with 2 turns activity did not occur: Safety/medical concerns  Assist level: Moderate Assistance - Patient - 50 - 74% Assistive device: Walker-rolling    Walk 150 feet activity   Assist Walk 150 feet activity did not occur: Safety/medical concerns  Walk 10 feet on uneven surface  activity   Assist Walk 10 feet on uneven surfaces activity did not occur: Safety/medical concerns         Wheelchair     Assist   Type of Wheelchair: Manual Wheelchair activity did not occur: Safety/medical concerns(decreased  strength/balance/endurance)  Wheelchair assist level: Maximal Assistance - Patient 25 - 49% Max wheelchair distance: 47ft    Wheelchair 50 feet with 2 turns activity    Assist    Wheelchair 50 feet with 2 turns activity did not occur: Safety/medical concerns(decreased strength/balance/endurance)   Assist Level: Maximal Assistance - Patient 25 - 49%   Wheelchair 150 feet activity     Assist Wheelchair 150 feet activity did not occur: Safety/medical concerns(decreased strength/balance/endurance)          Medical Problem List and Plan: 1.Right side weakness, aphasia, dysphasiasecondary to left MCA and ACA scattered infarct due to left ICA occlusion as well as history of previous stroke in the past and medical noncompliance  Continue CIR  PRAFO/WHO for RLE and RUE  Team conference today to discuss current and goals and coordination of care, home and environmental barriers, and discharge planning with nursing, case manager, and therapies.  2. Antithrombotics: -DVT/anticoagulation:SCDs -antiplatelet therapy: plavix 75mg daily 3. Pain Management:Tylenol as needed 4. Mood:Provide emotional support -antipsychotic agents: N/A 5. Neuropsych: This patientappears to be capable of making decisions on hisown behalf. 6. Skin/Wound Care:Routine skin checks 7. Fluids/Electrolytes/Nutrition:Routine in and outs 8. Post stroke dysphagia. Full liquid diet nectar thick consistency. Follow-up speech therapy -aspiration precautions  Discussed with sister, will proceed with Cortrak placement.    Advance diet as tolerated 9. History of CAD with myocardial infarction status post stenting. Continue Plavix. Patient is followed by Dr. Carlyle Dolly 10. Hypertension. Coreg 3.125 mg twice daily.   Hydralazine 10 3 times daily started on 7/31, increased to 25 3 times daily on 8/3  Norvasc 2.5 started on 8/1, increased to 5 on 8/2, increased  to 7.5 on 8/3  Labile on 8/5, with elevated diastolic readings  Monitor with increased mobility 11. Hyperlipidemia. Lipitor 12. History of tobacco as well as marijuana use. Counseling 13. Systolic CHF  Echo with EF of 40%  Chest x-ray personally reviewed, suggestive of cardiomegaly  Filed Weights   07/20/19 0410 07/21/19 0524 07/22/19 0502  Weight: (!) 146.2 kg (!) 148.5 kg (!) 149.2 kg   Will start lasix 20 daily  Trending up 14.  CKD stage III  Creatinine 1.60 on 8/3, labs ordered for tomorrow  Continue to monitor 15.  Morbid obesity: Encouraged weight loss 16. Sleep disturbance  Melatonin started on 8/4  LOS: 5 days A FACE TO FACE EVALUATION WAS PERFORMED  Zachary Mccann Zachary Mccann 07/22/2019, 12:48 PM

## 2019-07-22 NOTE — Patient Care Conference (Signed)
Inpatient RehabilitationTeam Conference and Plan of Care Update Date: 07/22/2019   Time: 11:10 AM    Patient Name: Zachary Mccann      Medical Record Number: [id number]  Date of Birth: 1971-05-22 Sex: Male         Room/Bed: 7D53G/9J24Q-68 Payor Info: Payor: MEDICAID POTENTIAL / Plan: MEDICAID POTENTIAL / Product Type: *No Product type* /    Admitting Diagnosis: 3. CVA 2 Team  LT. CVA; 22-24days  Admit Date/Time:  07/17/2019  2:53 PM Admission Comments: No comment available   Primary Diagnosis:  <principal problem not specified> Principal Problem: <principal problem not specified>  Patient Active Problem List   Diagnosis Date Noted  . Sleep disturbance   . Benign hypertensive heart and kidney disease with diastolic CHF, NYHA class II and CKD stage III ([place])   . Chronic systolic congestive heart failure ([place])   . Morbid obesity ([place])   . Uncontrolled hypertension   . Dysphagia, post-stroke   . Cardiomegaly   . Acute systolic congestive heart failure ([place])   . Left middle cerebral artery stroke ([place]) 07/17/2019  . Cerebral embolism with cerebral infarction 07/11/2019  . Acute CVA (cerebrovascular accident) ([place]) 07/11/2019  . Slurred speech 07/10/2019  . Cardiomyopathy ([place]) 11/20/2018  . History of stroke 09/06/2018  . Tobacco use 09/06/2018  . History of non-ST elevation myocardial infarction (NSTEMI) 05/05/2018  . Severe Vitamin D deficiency 04/02/2018  . Community acquired pneumonia of left lower lobe of lung ([place]) 04/02/2018  . CKD (chronic kidney disease), stage III ([place]) = Secondary to Hypertension 04/02/2018  . Pneumonia 03/30/2018  . Metabolic acidosis 34/19/6222  . AKI (acute kidney injury) ([place]) 03/30/2018  . N&V (nausea and vomiting) 03/30/2018  . Essential hypertension 03/30/2018    Expected Discharge Date: Expected Discharge Date: (3-4 weeks)  Team Members Present: Physician leading conference: Dr. Delice Lesch Social Worker Present: Lennart Pall, LCSW;Becky  DuPree, LCSW Nurse Present: Eliseo Squires, RN PT Present: Georjean Mode, PT OT Present: Clyda Greener, OT SLP Present: Stormy Fabian, SLP PPS Coordinator present : Gunnar Fusi, SLP     Current Status/Progress Goal Weekly Team Focus  Medical   Right side weakness, aphasia, dysphasia secondary to left MCA and ACA scattered infarct due to left ICA occlusion as well as history of previous stroke in the past and medical noncompliance  Improve mobility, HTN, sleep, CHF  See above   Bowel/Bladder   Incontinent of b/b; LBM: 08/01  time tolieting when awake; gain regular bowel pattern  assist with tolieting needs prn   Swallow/Nutrition/ Hydration   Max A, NTL full liquid diet  Min A  trials of thin and swallow strategies for oral control   ADL's   Min assist for UB bathing with mod assist for UB dressing.  Max assist for LB bathing and +2 for LB dressing sit to stand.  He is able to complete transfers stand pivot with max assist and sit to stand with mod assist.  Brunnstrum stage IV in the arm and hand, needs mod assist for functional use as a gross assist currently.  Expressive aphasia present as well.  min assist overall  neuromuscular re-education, selfcare retraining, balance retraining, transfer training, therapeutic exercise, pt/family education   Mobility   mod assist bed mobility, max assist sit<>stand and bed<>chair transfers using RW, mod assist gait 31ft using RW with +2 w/c follow  min assist bed mobility, transfers, and gait 68ft; mod assist 2 stair navigation; and supervision w/c propulsion  neuromusuclar re-education, standing  balance, transfer training, gait training, activity tolerance, pt education   Communication   Mod A multimodal communication, yes/no (thumbs) and writting at phrase  Mod I  attempt to vocalize, oral motor movement approixmations vowels and early consonants, phrase level communication awareness of spelling errors   Safety/Cognition/ Behavioral Observations             Pain   non-verbal, no facial expression of pain  remain pain free  assess pain level QS and prn   Skin   skin intact  maintain skin intergrity  assess skin QS and prn      *See Care Plan and progress notes for long and short-term goals.     Barriers to Discharge  Current Status/Progress Possible Resolutions Date Resolved   Physician    Medical stability;Weight        Therapies, follow labs, optimize BP meds, follow weights      Nursing                  PT  Inaccessible home environment;Home environment access/layout  2 STE and 2 story home, patient can live on first level with bed and bathroom.              OT                  SLP                SW                Discharge Planning/Teaching Needs:  HOme with fiance who is willing to provide 24 hr care she cares for her adopted four childrensome of which are disabled. She plans to come in and see in therapies to see his progress.      Team Discussion:  Monitor CHF/ wts;  CKD/ labs.  Adjusting bowel meds;  No pain/ skin issues.  Min- mod b/d and max LB;  Sit- stand mod assist.  Mod assist bed mob.  Min-mod gait.  OT/ PT goals at min assist with supervision w/c level.  ST very concerned with inability to swallow - questions possible need for NG/ Peg - MD aware.  Revisions to Treatment Plan:  NA    Continued Need for Acute Rehabilitation Level of Care: The patient requires daily medical management by a physician with specialized training in physical medicine and rehabilitation for the following conditions: Daily direction of a multidisciplinary physical rehabilitation program to ensure safe treatment while eliciting the highest outcome that is of practical value to the patient.: Yes Daily medical management of patient stability for increased activity during participation in an intensive rehabilitation regime.: Yes Daily analysis of laboratory values and/or radiology reports with any subsequent need for medication adjustment  of medical intervention for : Cardiac problems;Neurological problems;Blood pressure problems   I attest that I was present, lead the team conference, and concur with the assessment and plan of the team.   Alonza Bogus, Cloyce Paterson 07/22/2019, 3:26 PM    Team conference was held via web/ teleconference due to COVID - 19

## 2019-07-23 ENCOUNTER — Inpatient Hospital Stay (HOSPITAL_COMMUNITY): Payer: Self-pay | Admitting: Occupational Therapy

## 2019-07-23 ENCOUNTER — Inpatient Hospital Stay (HOSPITAL_COMMUNITY): Payer: Self-pay | Admitting: Physical Therapy

## 2019-07-23 ENCOUNTER — Inpatient Hospital Stay (HOSPITAL_COMMUNITY): Payer: Self-pay | Admitting: Speech Pathology

## 2019-07-23 DIAGNOSIS — R0989 Other specified symptoms and signs involving the circulatory and respiratory systems: Secondary | ICD-10-CM

## 2019-07-23 LAB — BASIC METABOLIC PANEL
Anion gap: 13 (ref 5–15)
BUN: 23 mg/dL — ABNORMAL HIGH (ref 6–20)
CO2: 20 mmol/L — ABNORMAL LOW (ref 22–32)
Calcium: 9.3 mg/dL (ref 8.9–10.3)
Chloride: 106 mmol/L (ref 98–111)
Creatinine, Ser: 1.8 mg/dL — ABNORMAL HIGH (ref 0.61–1.24)
GFR calc Af Amer: 50 mL/min — ABNORMAL LOW (ref >60)
GFR calc non Af Amer: 44 mL/min — ABNORMAL LOW (ref >60)
Glucose, Bld: 105 mg/dL — ABNORMAL HIGH (ref 70–99)
Potassium: 3.8 mmol/L (ref 3.5–5.1)
Sodium: 139 mmol/L (ref 135–145)

## 2019-07-23 MED ORDER — PRO-STAT SUGAR FREE PO LIQD
30.0000 mL | Freq: Three times a day (TID) | ORAL | Status: DC
  Administered 2019-07-24: 30 mL
  Filled 2019-07-23: qty 30

## 2019-07-23 MED ORDER — MELATONIN 3 MG PO TABS
3.0000 mg | ORAL_TABLET | Freq: Every day | ORAL | Status: DC
  Administered 2019-07-23 – 2019-08-11 (×20): 3 mg via ORAL
  Filled 2019-07-23 (×21): qty 1

## 2019-07-23 MED ORDER — SODIUM CHLORIDE 0.9 % IV SOLN
INTRAVENOUS | Status: DC
  Administered 2019-07-23 – 2019-07-27 (×4): via INTRAVENOUS

## 2019-07-23 MED ORDER — JEVITY 1.5 CAL/FIBER PO LIQD
1000.0000 mL | ORAL | Status: DC
  Filled 2019-07-23: qty 1000

## 2019-07-23 NOTE — Progress Notes (Signed)
Initial Nutrition Assessment  DOCUMENTATION CODES:   Morbid obesity  INTERVENTION:   Once Cortrak placed, initiate tube feeding: - Start Jevity 1.5 @ 25 ml/hr and increase rate by 10 ml q 4 hours until goal rate of 65 ml/hr is reached - Tube feeding can be held for up to 4 hours for therapies - Pro-stat 30 ml TID - Free water per MD/PA  Tube feeding regimen at goal rate provides 2250 kcal, 128 grams of protein, and 988 ml of H2O (if held for 4 hours for therapies).  - Continue Ensure Enlive po TID, each supplement provides 350 kcal and 20 grams of protein thickened to nectar-thick consistency  NUTRITION DIAGNOS83IS:   Inadequate oral intake related to dysphagia as evidenced by other (full liquid diet).  GOAL:   Patient will meet greater than or equal to 90% of their needs  MONITOR:   PO intake, Supplement acceptance, Diet advancement, Labs, Weight trends, TF tolerance, I & O's  REASON FOR ASSESSMENT:   Other (Full Liquid Diet x 5 days), Consult Enteral/tube feeding initiation and management  ASSESSMENT:   48 year old male with PMH of left cerebellar CVA September 2019, CAD non-STEMI status post stenting May 2019, HTN, CKD stage III, tobacco abuse, and medical noncompliance. Presented 07/10/19 with aphasia, right facial droop and right side weakness. UDS positive for marijuana. Cranial CT scan showd areas of prior infarction in the superior aspect of the left cerebellum. MRI/MRA showed occluded left internal carotid artery with reconstitution of the level of the left posterior communicating artery. Scattered punctate foci of cortical infarct involving the anterior left frontal lobe and left parietal lobe. Extensive left hemisphere acute subacute white matter infarcts involving the left centrum semiovale and corona radiata as well as border zone areas. Pt admitted to CIR on 7/31.   RD consulted for tube feeding initiation and management. Plan is for pt to have Cortrak placed  tomorrow (8/06) by Cortrak team. PA is aware that Cortrak will not be placed until tomorrow.  Met with pt at bedside. Pt in wheelchair at time of RD visit, requesting sips of juice. RD alerted RN.  Pt communicates with "thumbs up" and "thumbs down" gestures. Pt indicates that he is doing well and that he is hungry. Pt makes a "thumbs down" sign when RD mentions Cortrak and tube feeds.  Meal Completion: 0-100% x last 8 recorded meals (full liquid diet, nectar-thick)  Medications reviewed and include: Ensure Enlive TID, Lasix 20 mg daily, Miralax  Labs reviewed.  NUTRITION - FOCUSED PHYSICAL EXAM:    Most Recent Value  Orbital Region  No depletion  Upper Arm Region  No depletion  Thoracic and Lumbar Region  No depletion  Buccal Region  No depletion  Temple Region  No depletion  Clavicle Bone Region  No depletion  Clavicle and Acromion Bone Region  No depletion  Scapular Bone Region  No depletion  Dorsal Hand  No depletion  Patellar Region  No depletion  Anterior Thigh Region  No depletion  Posterior Calf Region  No depletion  Edema (RD Assessment)  None  Hair  Reviewed  Eyes  Reviewed  Mouth  Reviewed  Skin  Reviewed  Nails  Reviewed       Diet Order:   Diet Order            Diet full liquid Room service appropriate? Yes; Fluid consistency: Nectar Thick  Diet effective now              EDUCATION  NEEDS:   No education needs have been identified at this time  Skin:  Skin Assessment: Reviewed RN Assessment  Last BM:  07/18/19  Height:   Ht Readings from Last 1 Encounters:  07/10/19 6' (1.829 m)    Weight:   Wt Readings from Last 1 Encounters:  07/23/19 (!) 149.1 kg    Ideal Body Weight:  80.9 kg  BMI:  Body mass index is 44.59 kg/m.  Estimated Nutritional Needs:   Kcal:  2200-2400  Protein:  110-125 grams  Fluid:  >/= 2.0 L    Gaynell Face, MS, RD, LDN Inpatient Clinical Dietitian Pager: [phone] Weekend/After Hours:  [phone]

## 2019-07-23 NOTE — Progress Notes (Signed)
Occupational Therapy Session Note  Patient Details  Name: Zachary Mccann MRN: [id number] Date of Birth: 12/17/1971  Today's Date: 07/23/2019 OT Individual Time: 1446-1530 OT Individual Time Calculation (min): 44 min    Short Term Goals: Week 1:  OT Short Term Goal 1 (Week 1): Pt will complete transfer to [facility] with LRAD with mod +1 OT Short Term Goal 2 (Week 1): Pt will don shirt with min A OT Short Term Goal 3 (Week 1): Pt will require no more than min verbal cueing for use of hemi techniques during dressing OT Short Term Goal 4 (Week 1): Pt will complete peri hygiene in standing with max A  Skilled Therapeutic Interventions/Progress Updates:    Pt in wheelchair at start of session.  Therapist took him down to the therapy gym for further work on Clinical research associate.  Had pt work on maintaining upright sitting posture and anterior pelvic tilt while engaged in using the RUE to push a tilted stool to target.  He needed mod trunk facilitation to maintain anterior pelvic tilt.  Also had him work on maintaining RUE in contact with a sliding board while therapist moved it around, emphasizing sustained triceps and shoulder activation.  Min assist and mod instructional cueing needed as pt would get distracted in the gym and turn his focus and vision away from the [place].  Finished session with return to the room and mod assist stand pivot transfer to the bed.  Call button and phone in reach and safety alarm in place.    Therapy Documentation Precautions:  Precautions Precautions: Fall Restrictions Weight Bearing Restrictions: No  Pain: Pain Assessment Pain Scale: Faces Pain Score: 0-No pain   Therapy/Group: Individual Therapy  Coley Kulikowski OTR/L 07/23/2019, 4:06 PM

## 2019-07-23 NOTE — Progress Notes (Signed)
Speech Language Pathology Daily Session Note  Patient Details  Name: Zachary Mccann MRN: [id number] Date of Birth: 1971/06/05  Today's Date: 07/23/2019 SLP Individual Time: 1005-1047 SLP Individual Time Calculation (min): 42 min  Short Term Goals: Week 1: SLP Short Term Goal 1 (Week 1): Patient will consume current diet without overt s/sx of aspiration with mod A verbal cues for use of swallowing compensatory strategies. SLP Short Term Goal 2 (Week 1): Patient will consume trials of thin liquids with no overt s/sx of aspiration. SLP Short Term Goal 3 (Week 1): Pt will express wants/needs with multimodal communication (yes/no gestures and writting at phrase level) with Min A verbal cues. SLP Short Term Goal 4 (Week 1): Pt will vocalize a vocalic sound with max A verbal cues.  Skilled Therapeutic Interventions:  Pt was seen for skilled ST targeting goals for dysphagia and cognition.  SLP facilitated the session with trials of thin liquids following oral care to continue working towards diet progression.  Pt was unable to feed himself teaspoons of thin liquids due to motor deficits making it difficult to keep liquids on spoon.  He was able to take small sips of thin liquids from the cup with x1 delayed cough out of >10 trials.  Would recommend ongoing trials of thin liquids with ST to work towards initiation of less restrictive diet.  Therapist was able to elicit coordination of vocalization with oral motor movements via non speech acts such as yawning and forcefully pushing against armrests on wheelchair with max assist multimodal cues and repetitive attempts.  Vocalizations at this time are primarily gutteral and not true approximations of any real vowel sounds; however, I see more consistent coordination between initiation voicing and movement of articulators emerging with the abovementioned tasks.  Pt was left in wheelchair with call bell within reach and chair alarm set.  Continue per current plan  of care.    Pain Pain Assessment Pain Scale: Faces Faces Pain Scale: No hurt Pain Location: Arm Pain Orientation: Right Pain Intervention(s): Medication (See eMAR)  Therapy/Group: Individual Therapy  Zachary Mccann, Selinda Orion 07/23/2019, 10:49 AM

## 2019-07-23 NOTE — Progress Notes (Signed)
Physical Therapy Session Note  Patient Details  Name: Zachary Mccann MRN: [id number] Date of Birth: 1971-09-02  Today's Date: 07/23/2019 PT Individual Time: 8333-8329 PT Individual Time Calculation (min): 33 min   and  Today's Date: 07/23/2019 PT Missed Time: 12 Minutes Missed Time Reason: Nursing care  Short Term Goals: Week 1:  PT Short Term Goal 1 (Week 1): Patient will perform rolling in bed with min A with use of bed rails. PT Short Term Goal 2 (Week 1): Patient will perform supine to sit and sit to supine with mod A. PT Short Term Goal 3 (Week 1): Patient will perform sit<> stand with max A of 1 person using LRAD. PT Short Term Goal 3 - Progress (Week 1): Met PT Short Term Goal 4 (Week 1): Patient will perform basic transfers with max A of 1 person. PT Short Term Goal 4 - Progress (Week 1): Met PT Short Term Goal 5 (Week 1): Patient will initiate gait training. PT Short Term Goal 5 - Progress (Week 1): Met  Skilled Therapeutic Interventions/Progress Updates:   Upon arrival to room pt supine in bed with nursing staff present for in/out cath - pt missed 12 minutes of skilled therapy. Pt continues to use thumbs up/down for yes/no. Upon therapist return pt supine in bed and agreeable to therapy session with encouragement as pt reports he is in pain from the cath. Donned shorts supine with max assist for threading LEs and pulling over hips. Min assist rolling R and mod/max assist rolling L.   Supine R LE NMR via repeated hip flexion 2x10reps with assist for increased flexion ROM and cuing to time hip flexion with ankle DF, ankle PF/DF with focus on DF activation x10 reps, hip abduction/adduction x10 reps with cuing and manual facilitation to limit hip flexor activation compensation during abduction.  Supine>sit, HOB partially elevated and using bedrails, with mod assist for trunk upright and cuing for sequencing. Sit>stand elevated EOB>RW with mod assist, took 2 tries as pt initially  falling back onto bed due to lack of adequate and sustained anterior trunk flexion weight shift. Stand pivot EOB>w/c using RW with min/mod assist for balance and max cuing for sequencing of AD and R LE stepping. Pt left sitting in w/c with needs in reach and seat belt alarm on.   Therapy Documentation Precautions:  Precautions Precautions: Fall Restrictions Weight Bearing Restrictions: No  Pain:   Via thumbs up/down for yes/no pt reports pain from in/out cath and at R hand IV site - RN notified and present upon therapist exiting for medication administration.   Therapy/Group: Individual Therapy  Tawana Scale, PT, DPT 07/23/2019, 7:49 AM

## 2019-07-23 NOTE — Progress Notes (Signed)
[facility] PHYSICAL MEDICINE & REHABILITATION PROGRESS NOTE  Subjective/Complaints: Patient seen laying in bed this morning.  He indicates that he slept fairly overnight.  He is slightly more interactive this morning.  He does not have a Coretrak.  ROS: Little engagement this morning, but appears to deny CP, shortness of breath, nausea, vomiting, diarrhea.  Objective: Vital Signs: Blood pressure 131/74, pulse 99, temperature 98.7 F (37.1 C), temperature source Oral, resp. rate 16, weight (!) 149.1 kg, SpO2 100 %. No results found. No results for input(s): WBC, HGB, HCT, PLT in the last 72 hours. Recent Labs    07/23/19 0539  NA 139  K 3.8  CL 106  CO2 20*  GLUCOSE 105*  BUN 23*  CREATININE 1.80*  CALCIUM 9.3    Physical Exam: BP 131/74   Pulse 99   Temp 98.7 F (37.1 C) (Oral)   Resp 16   Wt (!) 149.1 kg   SpO2 100%   BMI 44.59 kg/m  Constitutional: No distress . Vital signs reviewed. HENT: Normocephalic.  Atraumatic. Eyes: EOMI. No discharge. Cardiovascular: No JVD. Respiratory: Normal effort.  No stridor. GI: Non-distended. Skin: Warm and dry.  Intact. Psych: Normal mood.  Normal behavior. Musc: No edema.  No tenderness. Neurological:Alert Right facial weakness Nonverbal. Motor: LUE/LLE: Grossly 4/5 Right upper extremity: Shoulder abduction 2/5, elbow flexion/extension 3-/5, handgrip 2/5 with apraxia, appears unchanged Right lower extremity: 4/5 grossly throughout, appears unchanged Skin: Skin iswarmand dry.  Psychiatric:Flat  Assessment/Plan: 1. Functional deficits secondary to left brain infarct with history of CVA which require 3+ hours per day of interdisciplinary therapy in a comprehensive inpatient rehab setting.  Physiatrist is providing close team supervision and 24 hour management of active medical problems listed below.  Physiatrist and rehab team continue to assess barriers to discharge/monitor patient progress toward functional and  medical goals  Care Tool:  Bathing    Body parts bathed by patient: Chest, Abdomen, Front perineal area, Left upper leg, Right upper leg, Face, Right arm   Body parts bathed by helper: Buttocks, Left lower leg, Right lower leg, Left arm     Bathing assist Assist Level: Moderate Assistance - Patient 50 - 74%     Upper Body Dressing/Undressing Upper body dressing   What is the patient wearing?: Pull over shirt    Upper body assist Assist Level: Maximal Assistance - Patient 25 - 49%    Lower Body Dressing/Undressing Lower body dressing      What is the patient wearing?: Pants, Incontinence brief     Lower body assist Assist for lower body dressing: Maximal Assistance - Patient 25 - 49%     Toileting Toileting    Toileting assist Assist for toileting: Moderate Assistance - Patient 50 - 74%     Transfers Chair/bed transfer  Transfers assist  Chair/bed transfer activity did not occur: Safety/medical concerns(Decreased strength/balance)  Chair/bed transfer assist level: Moderate Assistance - Patient 50 - 74%     Locomotion Ambulation   Ambulation assist   Ambulation activity did not occur: Safety/medical concerns(decreased strength/balance/endurance)  Assist level: Minimal Assistance - Patient > 75% Assistive device: Walker-rolling Max distance: 30   Walk 10 feet activity   Assist  Walk 10 feet activity did not occur: Safety/medical concerns(decreased strength/balance/endurance)  Assist level: Minimal Assistance - Patient > 75% Assistive device: Walker-rolling   Walk 50 feet activity   Assist Walk 50 feet with 2 turns activity did not occur: Safety/medical concerns  Assist level: Moderate Assistance - Patient -  50 - 74% Assistive device: Walker-rolling    Walk 150 feet activity   Assist Walk 150 feet activity did not occur: Safety/medical concerns         Walk 10 feet on uneven surface  activity   Assist Walk 10 feet on uneven surfaces  activity did not occur: Safety/medical concerns         Wheelchair     Assist   Type of Wheelchair: Manual Wheelchair activity did not occur: Safety/medical concerns(decreased strength/balance/endurance)  Wheelchair assist level: Maximal Assistance - Patient 25 - 49% Max wheelchair distance: 84ft    Wheelchair 50 feet with 2 turns activity    Assist    Wheelchair 50 feet with 2 turns activity did not occur: Safety/medical concerns(decreased strength/balance/endurance)   Assist Level: Maximal Assistance - Patient 25 - 49%   Wheelchair 150 feet activity     Assist Wheelchair 150 feet activity did not occur: Safety/medical concerns(decreased strength/balance/endurance)          Medical Problem List and Plan: 1.Right side weakness, aphasia, dysphasiasecondary to left MCA and ACA scattered infarct due to left ICA occlusion as well as history of previous stroke in the past and medical noncompliance  Continue CIR  PRAFO/WHO for RLE and RUE 2. Antithrombotics: -DVT/anticoagulation:SCDs -antiplatelet therapy: plavix 75mg daily 3. Pain Management:Tylenol as needed 4. Mood:Provide emotional support -antipsychotic agents: N/A 5. Neuropsych: This patientappears to be capable of making decisions on hisown behalf. 6. Skin/Wound Care:Routine skin checks 7. Fluids/Electrolytes/Nutrition:Routine in and outs 8. Post stroke dysphagia. Full liquid diet nectar thick consistency. Follow-up speech therapy -aspiration precautions  Awaiting core tract placement, discussed with nursing.  Advance diet as tolerated 9. History of CAD with myocardial infarction status post stenting. Continue Plavix. Patient is followed by Dr. Dina Rich 10. Hypertension. Coreg 3.125 mg twice daily.   Hydralazine 10 3 times daily started on 7/31, increased to 25 3 times daily on 8/3  Norvasc 2.5 started on 8/1, increased to 5 on 8/2,  increased to 7.5 on 8/3  Labile on 8/6  Monitor with increased mobility 11. Hyperlipidemia. Lipitor 12. History of tobacco as well as marijuana use. Counseling 13. Systolic CHF  Echo with EF of 68%  Chest x-ray personally reviewed, suggestive of cardiomegaly  Filed Weights   07/21/19 0524 07/22/19 0502 07/23/19 0546  Weight: (!) 148.5 kg (!) 149.2 kg (!) 149.1 kg   Will start lasix 20 daily  Stable on 8/6 14.  CKD stage III  Creatinine 1.8 on 8/6  IVF nightly started on 8/6  Continue to monitor 15.  Morbid obesity: Encouraged weight loss 16. Sleep disturbance  Melatonin started on 8/4, increased on 8/6  LOS: 6 days A FACE TO FACE EVALUATION WAS PERFORMED  Trysten Bernard Karis Juba 07/23/2019, 3:17 PM

## 2019-07-23 NOTE — Progress Notes (Signed)
Occupational Therapy Session Note  Patient Details  Name: Zachary Mccann MRN: [id number] Date of Birth: 04/25/1971  Today's Date: 07/23/2019 OT Individual Time: 1104-1200 OT Individual Time Calculation (min): 56 min    Short Term Goals: Week 1:  OT Short Term Goal 1 (Week 1): Pt will complete transfer to [facility] with LRAD with mod +1 OT Short Term Goal 2 (Week 1): Pt will don shirt with min A OT Short Term Goal 3 (Week 1): Pt will require no more than min verbal cueing for use of hemi techniques during dressing OT Short Term Goal 4 (Week 1): Pt will complete peri hygiene in standing with max A  Skilled Therapeutic Interventions/Progress Updates:    Pt worked on Clinical research associate during session.  Min assist for stand pivot transfer to the therapy mat from the wheelchair.  Once in sitting worked on lateral weigthshifts to the left in order to work on reciprical scooting on the right side, with RUE in weightbearing.  Max assist to complete reciprical scooting on the right side front of back.  Had him work on reaching with the LUE while weightbearing over the RUE to help support himself with min facilitation for balance.  He then worked on functional reaching to knee level with the RUE for picking up a small cup as well as wadded up paper towels.  Mod facilitation to complete reach and picking up the objects and placing them from knee level to abdominal height.  Finished session with return to the room and call button and phone in reach with safety alarm belt in place.  RUE placed on half lap tray with education to work on AROM on top of the tray.    Therapy Documentation Precautions:  Precautions Precautions: Fall Restrictions Weight Bearing Restrictions: No   Pain: Pain Assessment Pain Scale: Faces Pain Score: 0-No pain Faces Pain Scale: No hurt ADL: See Care Tool Section for some details of ADL  Therapy/Group: Individual Therapy  Jeilani Grupe OTR/L 07/23/2019, 12:56 PM

## 2019-07-23 NOTE — Progress Notes (Signed)
Social Work Patient ID: Zachary Mccann, male   DOB: [birth year], 48 y.o.   MRN: [id number]  Met with pt to discuss team conference goals-min assist level and swallowing issues. Plan is for cortrak to be placed for nutrition. Will set discharge date at next conference. Pt is trying to do well in therapies and fiance has been in to visit and provide support. Will continue to work on discharge needs.

## 2019-07-24 ENCOUNTER — Inpatient Hospital Stay (HOSPITAL_COMMUNITY): Payer: Self-pay | Admitting: Physical Therapy

## 2019-07-24 ENCOUNTER — Encounter (HOSPITAL_COMMUNITY): Payer: Self-pay | Admitting: *Deleted

## 2019-07-24 ENCOUNTER — Inpatient Hospital Stay (HOSPITAL_COMMUNITY): Payer: Self-pay | Admitting: Occupational Therapy

## 2019-07-24 ENCOUNTER — Inpatient Hospital Stay (HOSPITAL_COMMUNITY): Payer: Self-pay | Admitting: Speech Pathology

## 2019-07-24 MED ORDER — ADULT MULTIVITAMIN W/MINERALS CH
1.0000 | ORAL_TABLET | Freq: Every day | ORAL | Status: DC
  Administered 2019-07-24 – 2019-08-12 (×20): 1 via ORAL
  Filled 2019-07-24 (×20): qty 1

## 2019-07-24 MED ORDER — MIRTAZAPINE 15 MG PO TABS
7.5000 mg | ORAL_TABLET | Freq: Every day | ORAL | Status: DC
  Administered 2019-07-24 – 2019-08-11 (×19): 7.5 mg via ORAL
  Filled 2019-07-24 (×19): qty 1

## 2019-07-24 NOTE — Progress Notes (Signed)
Speech Language Pathology Weekly Progress and Session Note  Patient Details  Name: Zachary Mccann MRN: [id number] Date of Birth: Sep 07, 1971  Beginning of progress report period: July 17, 2019  End of progress report period: July 24, 2019  Today's Date: 07/24/2019 SLP Individual Time: 0803-0905 SLP Individual Time Calculation (min): 62 min  Short Term Goals: Week 1: SLP Short Term Goal 1 (Week 1): Patient will consume current diet without overt s/sx of aspiration with mod A verbal cues for use of swallowing compensatory strategies. SLP Short Term Goal 1 - Progress (Week 1): Met SLP Short Term Goal 2 (Week 1): Patient will consume trials of thin liquids with no overt s/sx of aspiration. SLP Short Term Goal 2 - Progress (Week 1): Progressing toward goal SLP Short Term Goal 3 (Week 1): Pt will express wants/needs with multimodal communication (yes/no gestures and writting at phrase level) with Min A verbal cues. SLP Short Term Goal 3 - Progress (Week 1): Met SLP Short Term Goal 4 (Week 1): Pt will vocalize a vocalic sound with max A verbal cues. SLP Short Term Goal 4 - Progress (Week 1): Progressing toward goal    New Short Term Goals: Week 2: SLP Short Term Goal 1 (Week 2): Pt will consume dys 1 textures and nectar thick liquids with min cues for use of swallowing precautions and minimal overt s/s of aspiration. SLP Short Term Goal 2 (Week 2): Patient will consume trials of thin liquids with minimal overt s/sx of aspiration and min cues for use of swallowing precautions over 3 consecutive sessions prior to repeat instrumental assessment. SLP Short Term Goal 3 (Week 2): Pt will produce vocalizations in coordination with movement of articulators in 50% of opportunities with max assist multimodal cues. SLP Short Term Goal 4 (Week 2): Pt will produce non speech oral motor movements on command as a precursor to speech tasks  in >50% of opportunities with max assist multimodal cues.  Weekly  Progress Updates:   Pt has made functional gains this reporting period and has met 2 out of 4 short term goals.  Pt is currently max to total assist for verbal expression of his needs and wants due to significant anarthria and apraxia.  Pt has demonstrated improved oral manipulation of pureed textures and was advanced to a dys 1, nectar thick liquids diet with full supervision for use of swallowing precautions.  Pt and family education is ongoing.  Pt would continue to benefit from skilled ST while inpatient in order to maximize functional independence and reduce burden of care prior to discharge.  Anticipate that pt will need 24/7 supervision at discharge in addition to [place] follow up at next level of care.    Intensity: Minumum of 1-2 x/day, 30 to 90 minutes Frequency: 3 to 5 out of 7 days Duration/Length of Stay: 16-20 days Treatment/Interventions: Speech/Language facilitation;Therapeutic Exercise;Patient/family education;Dysphagia/aspiration precaution training;Multimodal communication approach;Therapeutic Activities   Daily Session  Skilled Therapeutic Interventions: Pt was seen for skilled ST targeting goals for communication and dysphagia.  Pt was in bed upon arrival but was agreeable to getting out of bed for therapy.  Pt was transferred to wheelchair with +2 assist from nursing.  Pt had not had breakfast yet and consumed 24 oz of nectar thick liquids without overt s/s of aspiration.  SLP facilitated the session with trials of purees given noted improvements in the timeliness of his oral phase.  Pt consumed an entire cup of applesauce with little difficulty and was able to completely  clear residual purees from the oral cavity.  No overt s/s of aspiration were evident with advanced consistencies.  Pt made it clear via written expression that he did not want a feeding tube placed.  SLP discussed that advancing his diet to include pureed consistencies may increase his caloric intake but that medical  team may still want to place Cortrak until he can demonstrate that he is able to meet his nutritional needs orally.  Pt is very motivated to avoid feeding tube placement and he indicated via yes/no responses and writing in notepad that he wanted to try a pureed diet to increase his PO intake.  Discussed with RN and PA. Current goals updated to reflect current progress and plan of care.    General    Pain Pain Assessment Pain Scale: Faces Faces Pain Scale: No hurt  Therapy/Group: Individual Therapy  Leydy Worthey, Selinda Orion 07/24/2019, 11:09 AM

## 2019-07-24 NOTE — Progress Notes (Signed)
Nutrition Follow-up  DOCUMENTATION CODES:   Morbid obesity  INTERVENTION:   -Initiate 72 hour calorie count per MD -Feeding assistance with meals -Continue Ensure Enlive po TID (please thicken to nectar-thick consistency), each supplement provides 350 kcal and 20 grams of protein -MVI with minerals daily -Magic cup TID with meals, each supplement provides 290 kcal and 9 grams of protein -Hormel shake TID with meals, each supplement provides 520 kcals and 22 grams of protein  -If pt unable to meet nutrition needs via PO route after calorie count, strongly consider placement of cortrak tube (Cortrak service available Mondays and Fridays). If this is pursued, recommend:   -Start Jevity 1.5 @ 25 ml/hr and increase rate by 10 ml q 4 hours until goal rate of 65 ml/hr is reached - Tube feeding can be held for up to 4 hours for therapies - Pro-stat 30 ml TID - Free water per MD/PA  NUTRITION DIAGNOSIS:   Inadequate oral intake related to dysphagia as evidenced by other (comment)(full liquid diet).  Progressing; pt advanced to a dysphagia 1 diet with nectar thick liquids  GOAL:   Patient will meet greater than or equal to 90% of their needs  Progressing   MONITOR:   PO intake, Supplement acceptance, Diet advancement, Labs, Weight trends, TF tolerance, I & O's  REASON FOR ASSESSMENT:   Consult Calorie Count  ASSESSMENT:   48 year old male with PMH of left cerebellar CVA September 2019, CAD non-STEMI status post stenting May 2019, HTN, CKD stage III, tobacco abuse, and medical noncompliance. Presented 07/10/19 with aphasia, right facial droop and right side weakness. UDS positive for marijuana. Cranial CT scan showd areas of prior infarction in the superior aspect of the left cerebellum. MRI/MRA showed occluded left internal carotid artery with reconstitution of the level of the left posterior communicating artery. Scattered punctate foci of cortical infarct involving the anterior  left frontal lobe and left parietal lobe. Extensive left hemisphere acute subacute white matter infarcts involving the left centrum semiovale and corona radiata as well as border zone areas. Pt admitted to CIR on 7/31.  8/7- advanced to dysphagia 1 diet with nectar thick liquids  Reviewed I/O's: +221 ml x 24 hours and +481 ml since admission  UOP: 775 ml x 24 hours  Per SLP notes, pt advanced to dysphagia 1 diet with nectar thick liquids today. He was able to consume some liquids and applesauce this morning. Pt refusing cortrak placement at this time and order/ TF initiation currently on hold; calorie count has been initiated to determine if pt able to meet needs PO.   Spoke with pt, who was sitting in wheelchair with tray table in front of him at time of visit. He communicated with "thumbs up" and "thumbs down" motions. He reports that his appetite has improved and is drinking his Ensure supplements. However, pt reports he did not eat much breakfast this AM (meal completion records documented 0% at breakfast)- pt shares all he has had today was approximately half a cup of applesauce during medpass (approximately 40 kcals and 0 grams protein).   Discussed with pt importance of good meal and supplement intake to promote healing, especially he is wants to avoid cortrak tube placement.   Labs reviewed.   Diet Order:   Diet Order            DIET - DYS 1 Room service appropriate? Yes; Fluid consistency: Nectar Thick  Diet effective now  EDUCATION NEEDS:   No education needs have been identified at this time  Skin:  Skin Assessment: Reviewed RN Assessment  Last BM:  07/18/19  Height:   Ht Readings from Last 1 Encounters:  07/10/19 6' (1.829 m)    Weight:   Wt Readings from Last 1 Encounters:  07/24/19 (!) 149.1 kg    Ideal Body Weight:  80.9 kg  BMI:  Body mass index is 44.58 kg/m.  Estimated Nutritional Needs:   Kcal:  2200-2400  Protein:  110-125  grams  Fluid:  >/= 2.0 L    Lanessa Shill A. Mayford Knife, RD, LDN, CDCES Registered Dietitian II Certified Diabetes Care and Education Specialist Pager: [phone] After hours Pager: [phone]

## 2019-07-24 NOTE — Progress Notes (Signed)
Physical Therapy Session Note  Patient Details  Name: Zachary Mccann MRN: [id number] Date of Birth: 1971/01/15  Today's Date: 07/24/2019 PT Individual Time: 1696-7893 PT Individual Time Calculation (min): 8 min   and  Today's Date: 07/24/2019 PT Missed Time: 52 Minutes Missed Time Reason: Patient fatigue  Short Term Goals: Week 1:  PT Short Term Goal 1 (Week 1): Patient will perform rolling in bed with min A with use of bed rails. PT Short Term Goal 2 (Week 1): Patient will perform supine to sit and sit to supine with mod A. PT Short Term Goal 3 (Week 1): Patient will perform sit<> stand with max A of 1 person using LRAD. PT Short Term Goal 3 - Progress (Week 1): Met PT Short Term Goal 4 (Week 1): Patient will perform basic transfers with max A of 1 person. PT Short Term Goal 4 - Progress (Week 1): Met PT Short Term Goal 5 (Week 1): Patient will initiate gait training. PT Short Term Goal 5 - Progress (Week 1): Met  Skilled Therapeutic Interventions/Progress Updates:    Pt received supine in bed and continues to use thumbs up/down for yes/no. Upon questioning to participate in therapy session pt reluctantly replies with thumbs down - therapist provided various treatment intervention options but pt continued to reluctantly respond with thumbs down despite encouragement. Therapist asked further questions and pt denies pain but reports being tired/fatigued and requests to rest. Pt agreeable for therapist to perform R UE PROM for tone management and improved positioning in the bed - performed repeated elbow flexion/extension (focus on extension) and shoulder abduction. Assisted pt scooting caudally in bed for repositioning and therapeutic alignment of R hemibody posturing. Pt left supine in bed with needs in reach and bed alarm on. Pt missed 52 minutes of skilled physical therapy.  Therapy Documentation Precautions:  Precautions Precautions: Fall Restrictions Weight Bearing Restrictions:  No  Pain: Denies pain when asked via thumbs down.   Therapy/Group: Individual Therapy  Tawana Scale, PT, DPT 07/24/2019, 3:04 PM

## 2019-07-24 NOTE — Progress Notes (Signed)
PHYSICAL MEDICINE & REHABILITATION PROGRESS NOTE  Subjective/Complaints: Patient seen laying in bed this morning.  He indicates he did not sleep well overnight.  He is engaging with "thumbs up/thumbs down" again.  No coretrack in place.  ROS: Appears to deny CP, shortness of breath, nausea, vomiting, diarrhea.  Objective: Vital Signs: Blood pressure 131/74, pulse 99, temperature 98.7 F (37.1 C), temperature source Oral, resp. rate 16, weight (!) 149.1 kg, SpO2 100 %. No results found. No results for input(s): WBC, HGB, HCT, PLT in the last 72 hours. Recent Labs    07/23/19 0539  NA 139  K 3.8  CL 106  CO2 20*  GLUCOSE 105*  BUN 23*  CREATININE 1.80*  CALCIUM 9.3    Physical Exam: BP 131/74   Pulse 99   Temp 98.7 F (37.1 C) (Oral)   Resp 16   Wt (!) 149.1 kg   SpO2 100%   BMI 44.58 kg/m  Constitutional: NAD. Vital signs reviewed. HENT: Normocephalic.  Atraumatic. Eyes: EOMI. No discharge. Cardiovascular: No JVD. Respiratory: Normal effort.  No stridor. GI: Non-distended. Skin: Warm and dry.  Intact. Psych: Appears to be flat Musc: No edema.  No tenderness. Neurological:Alert Right facial weakness Nonverbal. Motor: LUE/LLE: Grossly 4/5 Right upper extremity: Shoulder abduction 2/5, elbow flexion/extension 3-/5, handgrip 2/5 with apraxia, unchanged Right lower extremity: 4/5 grossly throughout, unchanged  Assessment/Plan: 1. Functional deficits secondary to left brain infarct with history of CVA which require 3+ hours per day of interdisciplinary therapy in a comprehensive inpatient rehab setting.  Physiatrist is providing close team supervision and 24 hour management of active medical problems listed below.  Physiatrist and rehab team continue to assess barriers to discharge/monitor patient progress toward functional and medical goals  Care Tool:  Bathing    Body parts bathed by patient: Chest, Abdomen, Front perineal area, Left upper leg,  Right upper leg, Face, Right arm   Body parts bathed by helper: Buttocks, Left lower leg, Right lower leg, Left arm     Bathing assist Assist Level: Moderate Assistance - Patient 50 - 74%     Upper Body Dressing/Undressing Upper body dressing   What is the patient wearing?: Pull over shirt    Upper body assist Assist Level: Maximal Assistance - Patient 25 - 49%    Lower Body Dressing/Undressing Lower body dressing      What is the patient wearing?: Pants, Incontinence brief     Lower body assist Assist for lower body dressing: Maximal Assistance - Patient 25 - 49%     Toileting Toileting    Toileting assist Assist for toileting: Moderate Assistance - Patient 50 - 74%     Transfers Chair/bed transfer  Transfers assist  Chair/bed transfer activity did not occur: Safety/medical concerns(Decreased strength/balance)  Chair/bed transfer assist level: Moderate Assistance - Patient 50 - 74%     Locomotion Ambulation   Ambulation assist   Ambulation activity did not occur: Safety/medical concerns(decreased strength/balance/endurance)  Assist level: Minimal Assistance - Patient > 75% Assistive device: Walker-rolling Max distance: 30   Walk 10 feet activity   Assist  Walk 10 feet activity did not occur: Safety/medical concerns(decreased strength/balance/endurance)  Assist level: Minimal Assistance - Patient > 75% Assistive device: Walker-rolling   Walk 50 feet activity   Assist Walk 50 feet with 2 turns activity did not occur: Safety/medical concerns  Assist level: Moderate Assistance - Patient - 50 - 74% Assistive device: Walker-rolling    Walk 150 feet activity   Assist  Walk 150 feet activity did not occur: Safety/medical concerns         Walk 10 feet on uneven surface  activity   Assist Walk 10 feet on uneven surfaces activity did not occur: Safety/medical concerns         Wheelchair     Assist   Type of Wheelchair:  Manual Wheelchair activity did not occur: Safety/medical concerns(decreased strength/balance/endurance)  Wheelchair assist level: Maximal Assistance - Patient 25 - 49% Max wheelchair distance: 67ft    Wheelchair 50 feet with 2 turns activity    Assist    Wheelchair 50 feet with 2 turns activity did not occur: Safety/medical concerns(decreased strength/balance/endurance)   Assist Level: Maximal Assistance - Patient 25 - 49%   Wheelchair 150 feet activity     Assist Wheelchair 150 feet activity did not occur: Safety/medical concerns(decreased strength/balance/endurance)          Medical Problem List and Plan: 1.Right side weakness, aphasia, dysphasiasecondary to left MCA and ACA scattered infarct due to left ICA occlusion as well as history of previous stroke in the past and medical noncompliance  Continue CIR  PRAFO/WHO for RLE and RUE 2. Antithrombotics: -DVT/anticoagulation:SCDs -antiplatelet therapy: plavix 75mg daily 3. Pain Management:Tylenol as needed 4. Mood:Provide emotional support -antipsychotic agents: N/A 5. Neuropsych: This patientappears to be capable of making decisions on hisown behalf. 6. Skin/Wound Care:Routine skin checks 7. Fluids/Electrolytes/Nutrition:Routine in and outs 8. Post stroke dysphagia.   Advanced to D1 nectars today  Aspiration precautions  Discussed with core track with nursing, however after discussion with PA nursing and therapist feel patient may be able to maintain adequate nutrition p.o.  Patient also refusing, however discussed with sister who states patient would likely refuse and has history of making poor decisions for himself and that she is in favor of course tract.  We will hold off at present and monitor p.o. intake  Advance diet as tolerated 9. History of CAD with myocardial infarction status post stenting. Continue Plavix. Patient is followed by Dr. Dina Rich 10.  Hypertension. Coreg 3.125 mg twice daily.   Hydralazine 10 3 times daily started on 7/31, increased to 25 3 times daily on 8/3  Norvasc 2.5 started on 8/1, increased to 5 on 8/2, increased to 7.5 on 8/3  Labile on 8/7  Monitor with increased mobility 11. Hyperlipidemia. Lipitor 12. History of tobacco as well as marijuana use. Counseling 13. Systolic CHF  Echo with EF of 00%  Chest x-ray personally reviewed, suggestive of cardiomegaly  Filed Weights   07/22/19 0502 07/23/19 0546 07/24/19 0331  Weight: (!) 149.2 kg (!) 149.1 kg (!) 149.1 kg   Lasix 20 daily DC'd on 8/7 due to limited p.o. intake, will consider restarting if appropriate  Stable on 8/7 14.  CKD stage III  Creatinine 1.8 on 8/6, labs ordered for Monday  IVF nightly started on 8/6  Continue to monitor 15.  Morbid obesity: Encouraged weight loss 16. Sleep disturbance  Melatonin started on 8/4, increased on 8/6 DC'd on 8/7  Remeron 7.5 started on 8/7  LOS: 7 days A FACE TO FACE EVALUATION WAS PERFORMED  Zachary Mccann 07/24/2019, 12:16 PM

## 2019-07-24 NOTE — Progress Notes (Signed)
Occupational Therapy Weekly Progress Note  Patient Details  Name: Zachary Mccann MRN: [id number] Date of Birth: October 09, 1971  Beginning of progress report period: July 18, 2019 End of progress report period: July 24, 2019  Today's Date: 07/24/2019 OT Individual Time: 1004-1100 OT Individual Time Calculation (min): 56 min    Patient has met 4 of 4 short term goals.  Zachary Mccann is making steady progress with OT at this time.  He is currently completing UB bathing at min assist level and LB bathing at mod assist level sit to stand in the shower.  Mod assist is needed for UB dressing with max assist overall for LB dressing secondary to decreased ability to follow hemi dressing techniques as well as decreased flexibility for reaching to the left foot to donn items.  He currently demonstrates Brunnstrum stage III-IV movement in the right arm and hand as well.  He continues to need max assist for hand over hand use with bathing as well as incorporation of a wash mitt.  He demonstrates motor planning deficits as well as on some occasions when asked to open his fingers he cannot to command, but then when attempting to reach for something, the digits will extend 30% or more.  Stand pivot transfers with the RW are mod assist level to the toilet or the walk-in shower.  Min assist for sit to stand as well.  He still demonstrates some left inattention, requiring mod instructional cueing to remember to wash the RUE or the right leg.  Expressive aphasia is also still present as well as some receptive difficulties when given cueing such as needed with dressing tasks.  Recommend continued CIR level therapy for progression to established min assist level goals.    Patient continues to demonstrate the following deficits: muscle weakness, impaired timing and sequencing, unbalanced muscle activation and decreased coordination, decreased attention to right and right side neglect, decreased attention, decreased problem  solving and decreased memory and decreased standing balance, decreased postural control and decreased balance strategies and therefore will continue to benefit from skilled OT intervention to enhance overall performance with BADL and Reduce care partner burden.  Patient progressing toward long term goals..  Continue plan of care.  OT Short Term Goals Week 2:  OT Short Term Goal 1 (Week 2): Pt will complete bathing sit to stand with min assist. OT Short Term Goal 2 (Week 2): Pt will donn pullover shirt with min assist and mod demonstrational cueing. OT Short Term Goal 3 (Week 2): Pt will complete toileting with mod assist sit to stand. OT Short Term Goal 4 (Week 2): Pt will use the RUE as a gross assist with min facilitation during grooming and bathing task to hold items to be opened.  Skilled Therapeutic Interventions/Progress Updates:    Session 1: (1004-1100)   Pt worked on bathing and dressing during session.  He was able to ambulate into the shower with use of the RW and mod assist.  Once in the shower he needed mod assist for removal of UB and LB clothing.  He used the reacher for removal of the left gripper sock after therapist demonstrated use with removal of the right gripper sock.  He completed bathing sit to stand with mod assist and use of the LH sponge and the wash mitt on the right hand.  He worked on dressing sit to stand from the wheelchair.  Max demonstrational cueing for hemidressing techniques working on dressing the RUE and RLE first, with mod assist  for pullover and mod assist for donning shorts.  Attempted integration of the reacher for donning shorts over the RLE, but he was inconsistent with attempted use.  Total assist for gripper socks at end of session.  Pt left sitting up in the wheelchair with call button and phone in place and safety alarm belt in position.    Session 2:  [phone])  Pt worked on finishing his lunch to start session as nursing was present to assist.  He  then transferred to the EOB with min assist and therapist assisted with donning shorts and pullover shirt at max assist level.  He completed stand pivot transfer from the bed to the wheelchair with min assist.  Next pt took pt down to the ortho gym for RUE neuromuscular re-education with use of the UE ergonometer.  Therapist ace bandaged his right hand to the grip and then assisted with each revolutions.  He needed max facilitation with isolated use of the LUE only to complete revolutions with resistance on level 3.  He was able to complete revolutions with BUEs with supervision. Ergonometer was performed for 12 mins.   Finished session with transfer back to the room and pt transferring back to the room to rest before next PT session.  Call button and phone in reach with bed alarm in place.  RUE positioned on pillows as well.      Therapy Documentation Precautions:  Precautions Precautions: Fall Restrictions Weight Bearing Restrictions: No   Pain: Pain Assessment Pain Scale: Faces Pain Score: 0-No pain Faces Pain Scale: No hurt ADL: See Care Tool Section for some details of ADL  Therapy/Group: Individual Therapy  Tayja Manzer OTR/L 07/24/2019, 12:46 PM

## 2019-07-25 ENCOUNTER — Inpatient Hospital Stay (HOSPITAL_COMMUNITY): Payer: Self-pay | Admitting: Physical Therapy

## 2019-07-25 NOTE — Plan of Care (Signed)
  Problem: RH BOWEL ELIMINATION Goal: RH STG MANAGE BOWEL WITH ASSISTANCE Description: STG Manage Bowel with Min Assistance. Outcome: Progressing   Problem: RH BLADDER ELIMINATION Goal: RH STG MANAGE BLADDER WITH ASSISTANCE Description: STG Manage Bladder With Min Assistance Outcome: Progressing   Problem: RH COGNITION-NURSING Goal: RH STG ANTICIPATES NEEDS/CALLS FOR ASSIST W/ASSIST/CUES Description: STG Anticipates Needs/Calls for Assist With supervision Assistance/Cues. Outcome: Progressing   Problem: RH KNOWLEDGE DEFICIT Goal: RH STG INCREASE KNOWLEDGE OF STROKE PROPHYLAXIS Description: Pt will demonstrate increased knowledge of stroke prevention including diet management, medication regimen, and follow up care with primary care physician at the time of discharge with min assist/cues.  Outcome: Progressing

## 2019-07-25 NOTE — Progress Notes (Signed)
Physical Therapy Session Note  Patient Details  Name: Zachary Mccann MRN: [id number] Date of Birth: 02/23/1971  Today's Date: 07/25/2019 PT Individual Time: 1025-1110 PT Individual Time Calculation (min): 45 min   Short Term Goals: Week 1:  PT Short Term Goal 1 (Week 1): Patient will perform rolling in bed with min A with use of bed rails. PT Short Term Goal 2 (Week 1): Patient will perform supine to sit and sit to supine with mod A. PT Short Term Goal 3 (Week 1): Patient will perform sit<> stand with max A of 1 person using LRAD. PT Short Term Goal 3 - Progress (Week 1): Met PT Short Term Goal 4 (Week 1): Patient will perform basic transfers with max A of 1 person. PT Short Term Goal 4 - Progress (Week 1): Met PT Short Term Goal 5 (Week 1): Patient will initiate gait training. PT Short Term Goal 5 - Progress (Week 1): Met  Skilled Therapeutic Interventions/Progress Updates:    Pt received supine in bed and continues to reply to questions with thumbs up/down for yes/no - pt agreeable to therapy session. Rolling L with mod assist for rolling fully and rolling R with min assist. Donned clean brief and shorts in supine with max assist via rolling - pt able to assist using L hand for pulling pants up on that side. Attempted supine bridging with therapist manually facilitating R LE placement and providing manual facilitation for increased weight bearing but pt unable to lift R hip from bed. Supine>sit, HOB partially elevated and using bedrails, with mod assist for R LE management and trunk upright. Sit>stand very elevated EOB>RW with mod assist for lifting into standing. Stand pivot EOB>w/c using RW with mod assist for balance and max manual facilitation for weight shifting to step, especially R LE, as well as mod cuing for sequencing. Pt appeared to not feel well and pointed at his stomach, upon questioning pt reports feeling nauseous and requests to use bathroom. Vitals assessed BP 139/89 (MAP 104),  HR 93bpm, SpO2 100%.  Transported to/from bathroom in w/c. Sit>stand and stand pivot transfer w/c>BSC over toilet using grab bars with mod assist for lifting and balance - continued manual facilitation for L weight shift and R LE stepping - max cuing for sequencing. Standing with L UE support on grab bar total assist LB clothing management donning and doffing with min assist for balance. Stand>sit onto [facility] over toilet with mod assist for eccentric control. Pt unable to use bathroom - no bowel or bladder. Sit>stand BSC over toilet>grab bar and stand pivot transfer BSC>w/c using grab bar with mod assist for lifting and for balance while turning. Transported back to room in w/c and pt left sitting with needs in reach and seat belt alarm on.  Therapy Documentation Precautions:  Precautions Precautions: Fall Restrictions Weight Bearing Restrictions: No  Pain:   Reports stomach pain with nausea as well as pain located at R hand IV site - RN notified.    Therapy/Group: Individual Therapy  Tawana Scale, PT, DPT  07/25/2019, 8:01 AM

## 2019-07-25 NOTE — Progress Notes (Signed)
[place] PHYSICAL MEDICINE & REHABILITATION PROGRESS NOTE  Subjective/Complaints: No complaints over night. Sleeping soundly when I arrived.   ROS: Limited due to cognitive/behavioral    Objective: Vital Signs: Blood pressure (!) 136/96, pulse 87, temperature 98 F (36.7 C), resp. rate 20, height 6' (1.829 m), weight (!) 149.6 kg, SpO2 96 %. No results found. No results for input(s): WBC, HGB, HCT, PLT in the last 72 hours. Recent Labs    07/23/19 0539  NA 139  K 3.8  CL 106  CO2 20*  GLUCOSE 105*  BUN 23*  CREATININE 1.80*  CALCIUM 9.3    Physical Exam: BP (!) 136/96 (BP Location: Left Arm)   Pulse 87   Temp 98 F (36.7 C)   Resp 20   Ht 6' (1.829 m)   Wt (!) 149.6 kg   SpO2 96%   BMI 44.73 kg/m  Constitutional: No distress . Vital signs reviewed. HEENT: EOMI, oral membranes moist Neck: supple Cardiovascular: RRR without murmur. No JVD    Respiratory: CTA Bilaterally without wheezes or rales. Normal effort    GI: BS +, non-tender, non-distended  Skin: Warm and dry.  Intact. Psych: Appears to be flat Musc: No edema.  No tenderness. Neurological:Alert Right facial weakness Nonverbal. Motor: LUE/LLE: Grossly 4/5 Right upper extremity: Shoulder abduction 2/5, elbow flexion/extension 3-/5, handgrip 2/5. Apraxic, stable Right lower extremity: 4/5 grossly throughout, unchanged  Assessment/Plan: 1. Functional deficits secondary to left brain infarct with history of CVA which require 3+ hours per day of interdisciplinary therapy in a comprehensive inpatient rehab setting.  Physiatrist is providing close team supervision and 24 hour management of active medical problems listed below.  Physiatrist and rehab team continue to assess barriers to discharge/monitor patient progress toward functional and medical goals  Care Tool:  Bathing    Body parts bathed by patient: Chest, Abdomen, Front perineal area, Left upper leg, Right upper leg, Face, Right arm, Right  lower leg, Left lower leg   Body parts bathed by helper: Left arm, Buttocks     Bathing assist Assist Level: Moderate Assistance - Patient 50 - 74%     Upper Body Dressing/Undressing Upper body dressing   What is the patient wearing?: Pull over shirt    Upper body assist Assist Level: Moderate Assistance - Patient 50 - 74%    Lower Body Dressing/Undressing Lower body dressing      What is the patient wearing?: Pants, Incontinence brief     Lower body assist Assist for lower body dressing: Maximal Assistance - Patient 25 - 49%     Toileting Toileting    Toileting assist Assist for toileting: Moderate Assistance - Patient 50 - 74%     Transfers Chair/bed transfer  Transfers assist  Chair/bed transfer activity did not occur: Safety/medical concerns(Decreased strength/balance)  Chair/bed transfer assist level: Moderate Assistance - Patient 50 - 74%     Locomotion Ambulation   Ambulation assist   Ambulation activity did not occur: Safety/medical concerns(decreased strength/balance/endurance)  Assist level: Moderate Assistance - Patient 50 - 74% Assistive device: Walker-rolling Max distance: 10'   Walk 10 feet activity   Assist  Walk 10 feet activity did not occur: Safety/medical concerns(decreased strength/balance/endurance)  Assist level: Minimal Assistance - Patient > 75% Assistive device: Walker-rolling   Walk 50 feet activity   Assist Walk 50 feet with 2 turns activity did not occur: Safety/medical concerns  Assist level: Moderate Assistance - Patient - 50 - 74% Assistive device: Walker-rolling    Walk 150  feet activity   Assist Walk 150 feet activity did not occur: Safety/medical concerns         Walk 10 feet on uneven surface  activity   Assist Walk 10 feet on uneven surfaces activity did not occur: Safety/medical concerns         Wheelchair     Assist   Type of Wheelchair: Manual Wheelchair activity did not occur:  Safety/medical concerns(decreased strength/balance/endurance)  Wheelchair assist level: Maximal Assistance - Patient 25 - 49% Max wheelchair distance: 1ft    Wheelchair 50 feet with 2 turns activity    Assist    Wheelchair 50 feet with 2 turns activity did not occur: Safety/medical concerns(decreased strength/balance/endurance)   Assist Level: Maximal Assistance - Patient 25 - 49%   Wheelchair 150 feet activity     Assist Wheelchair 150 feet activity did not occur: Safety/medical concerns(decreased strength/balance/endurance)          Medical Problem List and Plan: 1.Right side weakness, aphasia, dysphasiasecondary to left MCA and ACA scattered infarct due to left ICA occlusion as well as history of previous stroke in the past and medical noncompliance  Continue CIR  PRAFO/WHO for RLE and RUE 2. Antithrombotics: -DVT/anticoagulation:SCDs -antiplatelet therapy: plavix 75mg daily 3. Pain Management:Tylenol as needed 4. Mood:Provide emotional support -antipsychotic agents: N/A 5. Neuropsych: This patientappears to be capable of making decisions on hisown behalf. 6. Skin/Wound Care:Routine skin checks 7. Fluids/Electrolytes/Nutrition:Routine in and outs 8. Post stroke dysphagia.   Advanced to D1 nectars 8/7  Aspiration precautions  Pt doesn't want NGT  -intake seems to be improving, ate 50% this morning and 80% last night for dinner  Advance diet as tolerated  -continue IVF 9. History of CAD with myocardial infarction status post stenting. Continue Plavix. Patient is followed by Dr. Dina Rich 10. Hypertension. Coreg 3.125 mg twice daily.   Hydralazine 10 3 times daily started on 7/31, increased to 25 3 times daily on 8/3  Norvasc 2.5 started on 8/1, increased to 5 on 8/2, increased to 7.5 on 8/3  DBP elevated at times  No changes indicated today 11. Hyperlipidemia. Lipitor 12. History of tobacco as well as  marijuana use. Counseling 13. Systolic CHF  Echo with EF of 88%  Chest x-ray personally reviewed, suggestive of cardiomegaly  Filed Weights   07/23/19 0546 07/24/19 0331 07/25/19 0508  Weight: (!) 149.1 kg (!) 149.1 kg (!) 149.6 kg   Lasix 20 daily DC'd on 8/7 due to limited p.o. intake, will consider restarting if appropriate  Stable on 8/8 14.  CKD stage III  Creatinine 1.8 on 8/6, labs ordered for Monday  IVF nightly started on 8/6  Continue to monitor 15.  Morbid obesity: Encouraged weight loss 16. Sleep disturbance  Melatonin started on 8/4, increased on 8/6 DC'd on 8/7  Remeron 7.5 started on 8/7  -seems to be improving  LOS: 8 days A FACE TO FACE EVALUATION WAS PERFORMED  Ranelle Oyster 07/25/2019, 9:39 AM

## 2019-07-26 MED ORDER — SENNOSIDES-DOCUSATE SODIUM 8.6-50 MG PO TABS
2.0000 | ORAL_TABLET | Freq: Every day | ORAL | Status: DC
  Administered 2019-07-26 – 2019-08-11 (×17): 2 via ORAL
  Filled 2019-07-26 (×19): qty 2

## 2019-07-26 NOTE — Plan of Care (Signed)
  Problem: Consults Goal: RH STROKE PATIENT EDUCATION Description: See Patient Education module for education specifics  Outcome: Progressing   Problem: RH COGNITION-NURSING Goal: RH STG ANTICIPATES NEEDS/CALLS FOR ASSIST W/ASSIST/CUES Description: STG Anticipates Needs/Calls for Assist With supervision Assistance/Cues. Outcome: Progressing   Problem: RH KNOWLEDGE DEFICIT Goal: RH STG INCREASE KNOWLEDGE OF STROKE PROPHYLAXIS Description: Pt will demonstrate increased knowledge of stroke prevention including diet management, medication regimen, and follow up care with primary care physician at the time of discharge with min assist/cues.  Outcome: Progressing   Problem: RH BOWEL ELIMINATION Goal: RH STG MANAGE BOWEL WITH ASSISTANCE Description: STG Manage Bowel with Min Assistance. Outcome: Not Progressing   Problem: RH BLADDER ELIMINATION Goal: RH STG MANAGE BLADDER WITH ASSISTANCE Description: STG Manage Bladder With Min Assistance Outcome: Not Progressing    Still requiring total assist to empty bladder with coude cath and required digital stimulation and oral meds to have BM today.

## 2019-07-26 NOTE — Progress Notes (Signed)
Notified Dr. Naaman Plummer of total urine output of 33ml during this shift.  Noted kidney function trending up.  Patient is on nectar thick liquids and NS @ 50 from 6p-6a.  Received new orders to increase IVF to 147ml 6p-6a.  Will continue to monitor.  Brita Romp, RN

## 2019-07-26 NOTE — Progress Notes (Signed)
[place] PHYSICAL MEDICINE & REHABILITATION PROGRESS NOTE  Subjective/Complaints: No new issues. Gave me the thumbs up today  ROS: limited due to language/communication   Objective: Vital Signs: Blood pressure 113/73, pulse 86, temperature 98.3 F (36.8 C), temperature source Oral, resp. rate 18, height 6' (1.829 m), weight (!) 147.8 kg, SpO2 98 %. No results found. No results for input(s): WBC, HGB, HCT, PLT in the last 72 hours. No results for input(s): NA, K, CL, CO2, GLUCOSE, BUN, CREATININE, CALCIUM in the last 72 hours.  Physical Exam: BP 113/73 (BP Location: Left Arm)   Pulse 86   Temp 98.3 F (36.8 C) (Oral)   Resp 18   Ht 6' (1.829 m)   Wt (!) 147.8 kg   SpO2 98%   BMI 44.19 kg/m  Constitutional: No distress . Vital signs reviewed. HEENT: EOMI, oral membranes moist Neck: supple Cardiovascular: RRR without murmur. No JVD    Respiratory: CTA Bilaterally without wheezes or rales. Normal effort    GI: BS +, non-tender, non-distended  Skin: Warm and dry.  Intact. Psych: Appears to be flat Musc: No edema.  No tenderness. Neurological:Alert Right facial weakness Nonverbal. Motor: LUE/LLE: Grossly 4/5 Right upper extremity: Shoulder abduction 2/5, elbow flexion/extension 3-/5, handgrip 2/5. Apraxic, stable Right lower extremity: 4/5 grossly throughout, stable  Assessment/Plan: 1. Functional deficits secondary to left brain infarct with history of CVA which require 3+ hours per day of interdisciplinary therapy in a comprehensive inpatient rehab setting.  Physiatrist is providing close team supervision and 24 hour management of active medical problems listed below.  Physiatrist and rehab team continue to assess barriers to discharge/monitor patient progress toward functional and medical goals  Care Tool:  Bathing    Body parts bathed by patient: Chest, Abdomen, Front perineal area, Left upper leg, Right upper leg, Face, Right arm, Right lower leg, Left lower  leg   Body parts bathed by helper: Left arm, Buttocks     Bathing assist Assist Level: Moderate Assistance - Patient 50 - 74%     Upper Body Dressing/Undressing Upper body dressing   What is the patient wearing?: Pull over shirt    Upper body assist Assist Level: Moderate Assistance - Patient 50 - 74%    Lower Body Dressing/Undressing Lower body dressing      What is the patient wearing?: Pants, Incontinence brief     Lower body assist Assist for lower body dressing: Maximal Assistance - Patient 25 - 49%     Toileting Toileting    Toileting assist Assist for toileting: Moderate Assistance - Patient 50 - 74%     Transfers Chair/bed transfer  Transfers assist  Chair/bed transfer activity did not occur: Safety/medical concerns(Decreased strength/balance)  Chair/bed transfer assist level: Moderate Assistance - Patient 50 - 74%     Locomotion Ambulation   Ambulation assist   Ambulation activity did not occur: Safety/medical concerns(decreased strength/balance/endurance)  Assist level: Moderate Assistance - Patient 50 - 74% Assistive device: Walker-rolling Max distance: 10'   Walk 10 feet activity   Assist  Walk 10 feet activity did not occur: Safety/medical concerns(decreased strength/balance/endurance)  Assist level: Minimal Assistance - Patient > 75% Assistive device: Walker-rolling   Walk 50 feet activity   Assist Walk 50 feet with 2 turns activity did not occur: Safety/medical concerns  Assist level: Moderate Assistance - Patient - 50 - 74% Assistive device: Walker-rolling    Walk 150 feet activity   Assist Walk 150 feet activity did not occur: Safety/medical concerns  Walk 10 feet on uneven surface  activity   Assist Walk 10 feet on uneven surfaces activity did not occur: Safety/medical concerns         Wheelchair     Assist   Type of Wheelchair: Manual Wheelchair activity did not occur: Safety/medical  concerns(decreased strength/balance/endurance)  Wheelchair assist level: Maximal Assistance - Patient 25 - 49% Max wheelchair distance: 34ft    Wheelchair 50 feet with 2 turns activity    Assist    Wheelchair 50 feet with 2 turns activity did not occur: Safety/medical concerns(decreased strength/balance/endurance)   Assist Level: Maximal Assistance - Patient 25 - 49%   Wheelchair 150 feet activity     Assist Wheelchair 150 feet activity did not occur: Safety/medical concerns(decreased strength/balance/endurance)          Medical Problem List and Plan: 1.Right side weakness, aphasia, dysphasiasecondary to left MCA and ACA scattered infarct due to left ICA occlusion as well as history of previous stroke in the past and medical noncompliance  Continue CIR  PRAFO/WHO for RLE and RUE 2. Antithrombotics: -DVT/anticoagulation:SCDs -antiplatelet therapy: plavix 75mg daily 3. Pain Management:Tylenol as needed 4. Mood:Provide emotional support -antipsychotic agents: N/A 5. Neuropsych: This patientappears to be capable of making decisions on hisown behalf. 6. Skin/Wound Care:Routine skin checks 7. Fluids/Electrolytes/Nutrition: check bmet tomorrow 8. Post stroke dysphagia.   Advanced to D1 nectars 8/7  Aspiration precautions  Pt doesn't want NGT  -intake seems to be improving, ate 50, 100, 100% yesteday  Advance diet as tolerated  -continue IVF 9. History of CAD with myocardial infarction status post stenting. Continue Plavix. Patient is followed by Dr. Carlyle Dolly 10. Hypertension. Coreg 3.125 mg twice daily.   Hydralazine 10 3 times daily started on 7/31, increased to 25 3 times daily on 8/3  Norvasc 2.5 started on 8/1, increased to 5 on 8/2, increased to 7.5 on 8/3  DBP elevated at times but improved today---continue to follow  No changes indicated today 11. Hyperlipidemia. Lipitor 12. History of tobacco as well as  marijuana use. Counseling 13. Systolic CHF  Echo with EF of 40%  Chest x-ray personally reviewed, suggestive of cardiomegaly  Filed Weights   07/24/19 0331 07/25/19 0508 07/26/19 0442  Weight: (!) 149.1 kg (!) 149.6 kg (!) 147.8 kg   Lasix 20 daily DC'd on 8/7 due to limited p.o. intake, will consider restarting if appropriate  Stable on 8/9 14.  CKD stage III  Creatinine 1.8 on 8/6, labs ordered for Monday  IVF nightly started on 8/6  Continue to monitor 15.  Morbid obesity: Encouraged weight loss 16. Sleep disturbance  Melatonin started on 8/4, increased on 8/6 DC'd on 8/7  Remeron 7.5 started on 8/7  -seems to be improving  LOS: 9 days A FACE TO [place]  Meredith Staggers 07/26/2019, 9:31 AM

## 2019-07-27 ENCOUNTER — Inpatient Hospital Stay (HOSPITAL_COMMUNITY): Payer: Self-pay

## 2019-07-27 ENCOUNTER — Inpatient Hospital Stay (HOSPITAL_COMMUNITY): Payer: Self-pay | Admitting: Occupational Therapy

## 2019-07-27 ENCOUNTER — Inpatient Hospital Stay (HOSPITAL_COMMUNITY): Payer: Medicaid Other

## 2019-07-27 DIAGNOSIS — G8191 Hemiplegia, unspecified affecting right dominant side: Secondary | ICD-10-CM

## 2019-07-27 LAB — BASIC METABOLIC PANEL
Anion gap: 11 (ref 5–15)
BUN: 19 mg/dL (ref 6–20)
CO2: 23 mmol/L (ref 22–32)
Calcium: 9.7 mg/dL (ref 8.9–10.3)
Chloride: 105 mmol/L (ref 98–111)
Creatinine, Ser: 1.66 mg/dL — ABNORMAL HIGH (ref 0.61–1.24)
GFR calc Af Amer: 56 mL/min — ABNORMAL LOW (ref >60)
GFR calc non Af Amer: 48 mL/min — ABNORMAL LOW (ref >60)
Glucose, Bld: 117 mg/dL — ABNORMAL HIGH (ref 70–99)
Potassium: 4.2 mmol/L (ref 3.5–5.1)
Sodium: 139 mmol/L (ref 135–145)

## 2019-07-27 NOTE — Progress Notes (Signed)
Speech Language Pathology Daily Session Note  Patient Details  Name: Zachary Mccann MRN: [id number] Date of Birth: February 21, 1971  Today's Date: 07/27/2019 SLP Individual Time: 1315-1345 SLP Individual Time Calculation (min): 30 min  Short Term Goals: Week 2: SLP Short Term Goal 1 (Week 2): Pt will consume dys 1 textures and nectar thick liquids with min cues for use of swallowing precautions and minimal overt s/s of aspiration. SLP Short Term Goal 2 (Week 2): Patient will consume trials of thin liquids with minimal overt s/sx of aspiration and min cues for use of swallowing precautions over 3 consecutive sessions prior to repeat instrumental assessment. SLP Short Term Goal 3 (Week 2): Pt will produce vocalizations in coordination with movement of articulators in 50% of opportunities with max assist multimodal cues. SLP Short Term Goal 4 (Week 2): Pt will produce non speech oral motor movements on command as a precursor to speech tasks  in >50% of opportunities with max assist multimodal cues.  Skilled Therapeutic Interventions: Skilled ST services focused on swallow skills. Pt missed initial 15 minutes due to completion of X-ray. SLP facilitated PO consumption of dys 1 and NTL via cup lunch tray, pt demonstrated moderate anterior spillage primarily on left side with liquids and mild with solids. Pt demonstrated x2 cough during PO consumption, suggest due to oral residue, pt had moderate oral residue which appeared to clear with liquid wash, however limited visulability due to pt unable to open mouth on command. SLP recommends oral care following PO intake. Pt demonstrated ability express wants/needs via response to yes/no questions with thumbs up/down, however ST noted reduced sustained attention and slight delayed processing compared to previous treatment with Probation officer. SLP notifed NT to finished supervision of meal. Pt was left in room with call bell within reach and bed alarm set. ST recommends to  continue skilled ST services.      Pain Pain Assessment Pain Score: 0-No pain  Therapy/Group: Individual Therapy  Shomari Scicchitano  [facility] 07/27/2019, 4:06 PM

## 2019-07-27 NOTE — Progress Notes (Signed)
Physical Therapy Session Note  Patient Details  Name: Zachary Mccann MRN: [id number] Date of Birth: [birth year]  Today's Date: 07/27/2019 PT Individual Time: 6945-0388 PT Individual Time Calculation (min): 57 min    Skilled Therapeutic Interventions/Progress Updates:    Pt supine in bed upon PT arrival, agreeable to therapy tx and denies pain. Pt transferred to sitting EOB with mod assist, increased time to complete. Pt performed sit<>stand from the EOB after multiple attempted and increased time to complete secondary to motor apraxia and poor motor planning, mod assist and then performed stand pivot to w/c with mod assist and uncontrolled decent into the w/c. Attempted to get pt to perform lateral scoot in w/c however pt unable to perform despite increased time given for motor planning and verbal/tactile cues. Pt transported to the gym in w/c. Pt ambulated x 4 ft with RW and mod assist and then turned to sit on mat with mod assist. Pt with uncontrolled decent onto mat and poor awareness, sitting right on the edge of mat. Pt performed sit<>stand with mod assist and RW, cues to perform R lateral side step to move laterally down mat, increased time. While standing pt then incontinent of bladder. Pt transferred to w/c with mod assist and transported back to room. Stand pivot to bed with RW and mod assist. Pt performed multiple sit<>stands from EOB x 3 with mod-max assist, increased posterior lean in standing all while therapist assisted with clothing management, changing clothes and pericare total assist. Pt transferred to supine and left with needs in reach and bed alarm set.   Therapy Documentation Precautions:  Precautions Precautions: Fall Precaution Comments: right hemiparesis Restrictions Weight Bearing Restrictions: No   Therapy/Group: Individual Therapy  Netta Corrigan, PT, DPT 07/27/2019, 12:52 PM

## 2019-07-27 NOTE — Progress Notes (Signed)
Occupational Therapy Session Note  Patient Details  Name: Zachary Mccann MRN: [id number] Date of Birth: 1971/12/05  Today's Date: 07/27/2019 OT Individual Time: 1003-1102 OT Individual Time Calculation (min): 59 min    Short Term Goals: Week 2:  OT Short Term Goal 1 (Week 2): Pt will complete bathing sit to stand with min assist. OT Short Term Goal 2 (Week 2): Pt will donn pullover shirt with min assist and mod demonstrational cueing. OT Short Term Goal 3 (Week 2): Pt will complete toileting with mod assist sit to stand. OT Short Term Goal 4 (Week 2): Pt will use the RUE as a gross assist with min facilitation during grooming and bathing task to hold items to be opened.  Skilled Therapeutic Interventions/Progress Updates:    Pt completed selfcare retraining shower level during session.  Pt with greater difficulty with sit to stand from the wheelchair to ambulate to the shower as had been done in previous sessions.  Mod to max assist for sit to stand initially with LOB posteriorly to the right and posteriorly.  Eventually rolled wheelchair into the bathroom and completed functional transfer stand pivot with mod assist to the shower. Decreased active movement in the RLE with stepping with therapist providing mod assist to advance the RLE as well.  He completed bathing with max assist sit to stand with use of the RUE and washmit.  Noted pt with less active movement in the RUE this session as well during bathing.  LH sponge utilized for washing the LEs.  He transferred stand pivot back to the wheelchair with mod assist for dressing at the sink.  Max assist for UB dressing secondary to decreased time and decreased active movement in the RUE.  Max assist for LB dressing as well sit to stand.  Asked pt if he felt weaker today compared to other sessions and he responded with a thumbs up.  Finished session with pt in the room and call button and phone in reach with safety alarm in place.  Nursing and Dr.  Letta Pate notified of pt's functional change.     Therapy Documentation Precautions:  Precautions Precautions: Fall Precaution Comments: right hemiparesis Restrictions Weight Bearing Restrictions: No  Pain: Pain Assessment Pain Scale: Faces Pain Score: 0-No pain Faces Pain Scale: No hurt ADL: See Care Tool Section for some details of ADL  Therapy/Group: Individual Therapy  Jayr Lupercio OTR/L 07/27/2019, 12:00 PM

## 2019-07-27 NOTE — Progress Notes (Addendum)
[facility] PHYSICAL MEDICINE & REHABILITATION PROGRESS NOTE  Subjective/Complaints:  Aphasic, dysarthric, eating breakfast with full supervision   ROS: limited due to language/communication   Objective: Vital Signs: Blood pressure (!) 151/85, pulse 92, temperature 98.5 F (36.9 C), temperature source Oral, resp. rate 18, height 6' (1.829 m), weight (!) 148.9 kg, SpO2 99 %. No results found. No results for input(s): WBC, HGB, HCT, PLT in the last 72 hours. No results for input(s): NA, K, CL, CO2, GLUCOSE, BUN, CREATININE, CALCIUM in the last 72 hours.  Physical Exam: BP (!) 151/85 (BP Location: Left Arm)   Pulse 92   Temp 98.5 F (36.9 C) (Oral)   Resp 18   Ht 6' (1.829 m)   Wt (!) 148.9 kg   SpO2 99%   BMI 44.52 kg/m  Constitutional: No distress . Vital signs reviewed. HEENT: EOMI, oral membranes moist Neck: supple Cardiovascular: RRR without murmur. No JVD    Respiratory: CTA Bilaterally without wheezes or rales. Normal effort    GI: BS +, non-tender, non-distended  Skin: Warm and dry.  Intact. Psych: Appears to be flat Musc: No edema.  No tenderness. Neurological:Alert Right facial weakness Nonverbal. Motor: LUE/LLE: Grossly 4/5 Right upper extremity: Shoulder abduction 2/5, elbow flexion/extension 2-/5, handgrip 0/5. Apraxic, stable Right lower extremity: 1/5 grossly throughout,   Assessment/Plan: 1. Functional deficits secondary to left brain infarct with history of CVA which require 3+ hours per day of interdisciplinary therapy in a comprehensive inpatient rehab setting.  Physiatrist is providing close team supervision and 24 hour management of active medical problems listed below.  Physiatrist and rehab team continue to assess barriers to discharge/monitor patient progress toward functional and medical goals  Care Tool:  Bathing    Body parts bathed by patient: Chest, Abdomen, Front perineal area, Left upper leg, Right upper leg, Face, Right arm, Right  lower leg, Left lower leg   Body parts bathed by helper: Left arm, Buttocks     Bathing assist Assist Level: Moderate Assistance - Patient 50 - 74%     Upper Body Dressing/Undressing Upper body dressing   What is the patient wearing?: Pull over shirt    Upper body assist Assist Level: Moderate Assistance - Patient 50 - 74%    Lower Body Dressing/Undressing Lower body dressing      What is the patient wearing?: Pants, Incontinence brief     Lower body assist Assist for lower body dressing: Maximal Assistance - Patient 25 - 49%     Toileting Toileting    Toileting assist Assist for toileting: Moderate Assistance - Patient 50 - 74%     Transfers Chair/bed transfer  Transfers assist  Chair/bed transfer activity did not occur: Safety/medical concerns(Decreased strength/balance)  Chair/bed transfer assist level: Moderate Assistance - Patient 50 - 74%     Locomotion Ambulation   Ambulation assist   Ambulation activity did not occur: Safety/medical concerns(decreased strength/balance/endurance)  Assist level: Moderate Assistance - Patient 50 - 74% Assistive device: Walker-rolling Max distance: 10'   Walk 10 feet activity   Assist  Walk 10 feet activity did not occur: Safety/medical concerns(decreased strength/balance/endurance)  Assist level: Minimal Assistance - Patient > 75% Assistive device: Walker-rolling   Walk 50 feet activity   Assist Walk 50 feet with 2 turns activity did not occur: Safety/medical concerns  Assist level: Moderate Assistance - Patient - 50 - 74% Assistive device: Walker-rolling    Walk 150 feet activity   Assist Walk 150 feet activity did not occur: Safety/medical concerns  Walk 10 feet on uneven surface  activity   Assist Walk 10 feet on uneven surfaces activity did not occur: Safety/medical concerns         Wheelchair     Assist   Type of Wheelchair: Manual Wheelchair activity did not occur:  Safety/medical concerns(decreased strength/balance/endurance)  Wheelchair assist level: Maximal Assistance - Patient 25 - 49% Max wheelchair distance: 60ft    Wheelchair 50 feet with 2 turns activity    Assist    Wheelchair 50 feet with 2 turns activity did not occur: Safety/medical concerns(decreased strength/balance/endurance)   Assist Level: Maximal Assistance - Patient 25 - 49%   Wheelchair 150 feet activity     Assist Wheelchair 150 feet activity did not occur: Safety/medical concerns(decreased strength/balance/endurance)          Medical Problem List and Plan: 1.Right side weakness, aphasia, dysphasiasecondary to left MCA and ACA scattered infarct due to left ICA occlusion as well as history of previous stroke in the past and medical noncompliance  Continue CIR, PT, OT SLP, according to OT patient's mobility has reduced from min assist to mod assist, weaker on the right side.  Difficult to say given apraxia and aphasia regarding effort however will check CT head to rule out extension or hemorrhagic conversion  PRAFO/WHO for RLE and RUE 2. Antithrombotics: -DVT/anticoagulation:SCDs -antiplatelet therapy: plavix 75mg daily 3. Pain Management:Tylenol as needed 4. Mood:Provide emotional support -antipsychotic agents: N/A 5. Neuropsych: This patientappears to be capable of making decisions on hisown behalf. 6. Skin/Wound Care:Routine skin checks 7. Fluids/Electrolytes/Nutrition: check bmet tomorrow 8. Post stroke dysphagia.   Advanced to D1 nectars 8/7  Aspiration precautions  Pt doesn't want NGT  -intake seems to be improving, ate 50, 100, 100% yesteday  Advance diet as tolerated  -continue IVF 9. History of CAD with myocardial infarction status post stenting. Continue Plavix. Patient is followed by Dr. Dina Rich 10. Hypertension. Coreg 3.125 mg twice daily.   Hydralazine 10 3 times daily started on 7/31, increased  to 25 3 times daily on 8/3  Norvasc 2.5 started on 8/1, increased to 5 on 8/2, increased to 7.5 on 8/3   Vitals:   07/26/19 1918 07/27/19 0444  BP: (!) 147/93 (!) 151/85  Pulse: (!) 102 92  Resp: 18 18  Temp: 98 F (36.7 C) 98.5 F (36.9 C)  SpO2: 98% 99%  remains elevated will increase amlodipine to 10mg    No changes indicated today 11. Hyperlipidemia. Lipitor 12. History of tobacco as well as marijuana use. Counseling 13. Systolic CHF  Echo with EF of 69%  Chest x-ray personally reviewed, suggestive of cardiomegaly  Filed Weights   07/25/19 0508 07/26/19 0442 07/27/19 0444  Weight: (!) 149.6 kg (!) 147.8 kg (!) 148.9 kg   Lasix 20 daily DC'd on 8/7 due to limited p.o. intake, will consider restarting if appropriate  Stable on 8/10 14.  CKD stage III  Creatinine 1.8 on 8/6, labs ordered for Monday  IVF nightly started on 8/6  Continue to monitor 15.  Morbid obesity: Encouraged weight loss 16. Sleep disturbance  Melatonin started on 8/4, increased on 8/6 DC'd on 8/7  Remeron 7.5 started on 8/7   LOS: 10 days A FACE TO FACE EVALUATION WAS PERFORMED  Erick Colace 07/27/2019, 7:54 AM

## 2019-07-27 NOTE — Progress Notes (Signed)
Occupational Therapy Session Note  Patient Details  Name: Zachary Mccann MRN: [id number] Date of Birth: 11-Jun-1971  Today's Date: 07/27/2019 OT Individual Time: 0805-0900 OT Individual Time Calculation (min): 55 min    Short Term Goals: Week 2:  OT Short Term Goal 1 (Week 2): Pt will complete bathing sit to stand with min assist. OT Short Term Goal 2 (Week 2): Pt will donn pullover shirt with min assist and mod demonstrational cueing. OT Short Term Goal 3 (Week 2): Pt will complete toileting with mod assist sit to stand. OT Short Term Goal 4 (Week 2): Pt will use the RUE as a gross assist with min facilitation during grooming and bathing task to hold items to be opened.  Skilled Therapeutic Interventions/Progress Updates:    Pt received supine with no c/o pain via thumbs up/down. Pt transitioned to EOB from supine with mod A with use of bed rail and HOB elevated. Pt indicated his brief needed to be changed and it was newly saturated with urine. Pt completed sit > stand with mod A, requiring mod cueing for technique and for placing RUE on orthosis on RW. Pt required max A for brief change and hygiene. Pt handed wash cloth and encouraged to attempt peri hygiene but he kept handing it back to therapist instead. Pt completed 5 ft of functional mobility to w/c with mod A. Pt became distracted/perseverative on spit running from mouth and stopped listening to cueing at the end of the transfer during turn. Max A required and quick chair pull around for pt to sit. Pt edu on importance of sometimes ignoring this spit in favor of safe transfers and dangers of fall. Pt completed oral hygiene at sink with min A. Pt was transported to [place] room to focus on visual scanning to the R and L and for functional reaching. Pt required no cueing for head turning compensation but R vs L reaction time was 5.17 vs 2.71 seconds. Pt was returned to his room and left sitting up with all needs met, chair belt alarm set.    Therapy Documentation Precautions:  Precautions Precautions: Fall Restrictions Weight Bearing Restrictions: No   Therapy/Group: Individual Therapy  Curtis Sites 07/27/2019, 7:13 AM

## 2019-07-27 NOTE — Progress Notes (Signed)
Calorie Count Note  72-hour calorie count ordered. Calorie count started 8/7 at lunch meal and ended today 8/10 after breakfast meal.  Diet: Dysphagia 1, nectar-thick liquids Supplements: - Ensure Enlive po TID (thickened to nectar-thick consistency), each supplement provides 350 kcal and 20 grams of protein - Magic cup TID with meals, each supplement provides 290 kcal and 9 grams of protein - Vital Cuisine Shake TID, each supplement provides 520 kcal and 22 grams of protein  Day 1: 8/7 Lunch: 285 kcal, 12 grams of protein (includes Magic Cup and Vital shake) 8/7 Dinner: 916 kcal, 40 grams of protein (includes Magic Cup and Vital shake) 8/8 Breakfast: 915 kcal, 35 grams of protein Supplements: 350 kcal, 20 grams of protein (1 Ensure Enlive)  Total 24-hour intake: 2466 kcal (>100% of estimated needs)  107 grams of protein (97% of minimum estimated needs)  Day 2: 8/8 Lunch: 970 kcal, 25 grams of protein 8/8 Dinner: 1298 kcal, 56 grams of protein (includes Magic Cup and Vital shake) 8/9 Breakfast: 226 kcal, 9 grams of protein Supplements: 350 kcal, 20 grams of protein (1 Ensure Enlive)  Total 24-hour intake: 2844 kcal (>100% of estimated needs)  110 grams of protein (100% of minimum estimated needs)  Day 3: 8/9 Lunch: 413 kcal, 33 grams of protein 8/9 Dinner: 883 kcal, 32 grams of protein 8/10 Breakfast: 867 kcal, 39 grams of protein (includes Magic Cup) Supplements: 350 kcal, 20 grams of protein (1 Ensure Enlive)  Total 24-hour intake: 2513 kcal (>100% of estimated needs)  124 grams of protein (>100% of minimum estimated needs)  Nutrition Diagnosis: Inadequate oral intake related to dysphagia as evidenced by other (full liquid diet).  Goal: Patient will meet greater than or equal to 90% of their needs  Intervention: - d/c 72-hour calorie count - Continue Ensure Enlive po TID, each supplement provides 350 kcal and 20 grams of protein - Continue Magic cup TID with meals,  each supplement provides 290 kcal and 9 grams of protein - Continue Vital Cuisine Shake TID, each supplement provides 520 kcal and 22 grams of protein - Continue MVI with minerals daily   Gaynell Face, MS, RD, LDN Inpatient Clinical Dietitian Pager: [phone] Weekend/After Hours: [phone]

## 2019-07-28 ENCOUNTER — Inpatient Hospital Stay (HOSPITAL_COMMUNITY): Payer: Self-pay

## 2019-07-28 ENCOUNTER — Inpatient Hospital Stay (HOSPITAL_COMMUNITY): Payer: Self-pay | Admitting: Occupational Therapy

## 2019-07-28 ENCOUNTER — Inpatient Hospital Stay (HOSPITAL_COMMUNITY): Payer: Medicaid Other

## 2019-07-28 LAB — CBC WITH DIFFERENTIAL/PLATELET
Abs Immature Granulocytes: 0.03 10*3/uL (ref 0.00–0.07)
Basophils Absolute: 0.1 10*3/uL (ref 0.0–0.1)
Basophils Relative: 1 %
Eosinophils Absolute: 0.2 10*3/uL (ref 0.0–0.5)
Eosinophils Relative: 2 %
HCT: 42.3 % (ref 39.0–52.0)
Hemoglobin: 13.7 g/dL (ref 13.0–17.0)
Immature Granulocytes: 0 %
Lymphocytes Relative: 16 %
Lymphs Abs: 1.8 10*3/uL (ref 0.7–4.0)
MCH: 31.6 pg (ref 26.0–34.0)
MCHC: 32.4 g/dL (ref 30.0–36.0)
MCV: 97.5 fL (ref 80.0–100.0)
Monocytes Absolute: 0.8 10*3/uL (ref 0.1–1.0)
Monocytes Relative: 7 %
Neutro Abs: 8 10*3/uL — ABNORMAL HIGH (ref 1.7–7.7)
Neutrophils Relative %: 74 %
Platelets: 258 10*3/uL (ref 150–400)
RBC: 4.34 MIL/uL (ref 4.22–5.81)
RDW: 11.6 % (ref 11.5–15.5)
WBC: 10.8 10*3/uL — ABNORMAL HIGH (ref 4.0–10.5)
nRBC: 0 % (ref 0.0–0.2)

## 2019-07-28 LAB — URINALYSIS, ROUTINE W REFLEX MICROSCOPIC
Bilirubin Urine: NEGATIVE
Glucose, UA: NEGATIVE mg/dL
Ketones, ur: NEGATIVE mg/dL
Nitrite: NEGATIVE
Protein, ur: 100 mg/dL — AB
RBC / HPF: >50 RBC/hpf — ABNORMAL HIGH (ref 0–5)
Specific Gravity, Urine: 1.02 (ref 1.005–1.030)
WBC, UA: >50 WBC/hpf — ABNORMAL HIGH (ref 0–5)
pH: 5 (ref 5.0–8.0)

## 2019-07-28 NOTE — Progress Notes (Signed)
[place] PHYSICAL MEDICINE & REHABILITATION PROGRESS NOTE  Subjective/Complaints:  Repeat CT reviewed , discussed with OT  ROS: limited due to language/communication   Objective: Vital Signs: Blood pressure (!) 156/100, pulse 100, temperature 98.2 F (36.8 C), resp. rate 20, height 6' (1.829 m), weight (!) 146.7 kg, SpO2 99 %. Ct Head Wo Contrast  Result Date: 07/27/2019 CLINICAL DATA:  Right-sided weakness. EXAM: CT HEAD WITHOUT CONTRAST TECHNIQUE: Contiguous axial images were obtained from the base of the skull through the vertex without intravenous contrast. COMPARISON:  CT scan of July 10, 2019. FINDINGS: Brain: Stable low density is seen in right periventricular white matter consistent with old infarction. Old left cerebellar infarction is noted. No acute infarction, mass lesion or hemorrhage is noted. No mass effect or midline shift is noted. Ventricular size is within normal limits. Vascular: No hyperdense vessel or unexpected calcification. Skull: Normal. Negative for fracture or focal lesion. Sinuses/Orbits: No acute finding. Other: None. IMPRESSION: No acute intracranial abnormality seen. Electronically Signed   By: Lupita Raider M.D.   On: 07/27/2019 13:33   No results for input(s): WBC, HGB, HCT, PLT in the last 72 hours. Recent Labs    07/27/19 1136  NA 139  K 4.2  CL 105  CO2 23  GLUCOSE 117*  BUN 19  CREATININE 1.66*  CALCIUM 9.7    Physical Exam: BP (!) 156/100 (BP Location: Left Arm)   Pulse 100   Temp 98.2 F (36.8 C)   Resp 20   Ht 6' (1.829 m)   Wt (!) 146.7 kg   SpO2 99%   BMI 43.86 kg/m  Constitutional: No distress . Vital signs reviewed. HEENT: EOMI, oral membranes moist Neck: supple Cardiovascular: RRR without murmur. No JVD    Respiratory: CTA Bilaterally without wheezes or rales. Normal effort    GI: BS +, non-tender, non-distended  Skin: Warm and dry.  Intact. Psych: Appears to be flat Musc: No edema.  No  tenderness. Neurological:Alert Right facial weakness Nonverbal. Motor: LUE/LLE: Grossly 4/5 Right upper extremity: Shoulder abduction 2/5, elbow flexion/extension 2-/5, handgrip 1/5. Apraxic, stable Right lower extremity: 2-/5 hip/knee ext synergy  Assessment/Plan: 1. Functional deficits secondary to left brain infarct with history of CVA which require 3+ hours per day of interdisciplinary therapy in a comprehensive inpatient rehab setting.  Physiatrist is providing close team supervision and 24 hour management of active medical problems listed below.  Physiatrist and rehab team continue to assess barriers to discharge/monitor patient progress toward functional and medical goals  Care Tool:  Bathing    Body parts bathed by patient: Chest, Right arm, Abdomen, Front perineal area, Right upper leg, Left upper leg, Right lower leg, Left lower leg, Face   Body parts bathed by helper: Buttocks, Left arm     Bathing assist Assist Level: Moderate Assistance - Patient 50 - 74%     Upper Body Dressing/Undressing Upper body dressing   What is the patient wearing?: Pull over shirt    Upper body assist Assist Level: Maximal Assistance - Patient 25 - 49%    Lower Body Dressing/Undressing Lower body dressing      What is the patient wearing?: Pants, Incontinence brief     Lower body assist Assist for lower body dressing: Maximal Assistance - Patient 25 - 49%     Toileting Toileting    Toileting assist Assist for toileting: Moderate Assistance - Patient 50 - 74%     Transfers Chair/bed transfer  Transfers assist  Chair/bed transfer  activity did not occur: Safety/medical concerns(Decreased strength/balance)  Chair/bed transfer assist level: Moderate Assistance - Patient 50 - 74%     Locomotion Ambulation   Ambulation assist   Ambulation activity did not occur: Safety/medical concerns(decreased strength/balance/endurance)  Assist level: Moderate Assistance - Patient 50  - 74% Assistive device: Walker-rolling Max distance: 4 ft   Walk 10 feet activity   Assist  Walk 10 feet activity did not occur: Safety/medical concerns(decreased strength/balance/endurance)  Assist level: Minimal Assistance - Patient > 75% Assistive device: Walker-rolling   Walk 50 feet activity   Assist Walk 50 feet with 2 turns activity did not occur: Safety/medical concerns  Assist level: Moderate Assistance - Patient - 50 - 74% Assistive device: Walker-rolling    Walk 150 feet activity   Assist Walk 150 feet activity did not occur: Safety/medical concerns         Walk 10 feet on uneven surface  activity   Assist Walk 10 feet on uneven surfaces activity did not occur: Safety/medical concerns         Wheelchair     Assist   Type of Wheelchair: Manual Wheelchair activity did not occur: Safety/medical concerns(decreased strength/balance/endurance)  Wheelchair assist level: Maximal Assistance - Patient 25 - 49% Max wheelchair distance: 7425ft    Wheelchair 50 feet with 2 turns activity    Assist    Wheelchair 50 feet with 2 turns activity did not occur: Safety/medical concerns(decreased strength/balance/endurance)   Assist Level: Maximal Assistance - Patient 25 - 49%   Wheelchair 150 feet activity     Assist Wheelchair 150 feet activity did not occur: Safety/medical concerns(decreased strength/balance/endurance)          Medical Problem List and Plan: 1.Right side weakness, aphasia, dysphasiasecondary to left MCA and ACA scattered infarct due to left ICA occlusion as well as history of previous stroke in the past and medical noncompliance  Continue CIR, PT, OT SLP, Team conf in am  PRAFO/WHO for RLE and RUE 2. Antithrombotics: -DVT/anticoagulation:SCDs -antiplatelet therapy: plavix 75mg daily 3. Pain Management:Tylenol as needed 4. Mood:Provide emotional support -antipsychotic agents: N/A 5.  Neuropsych: This patientappears to be capable of making decisions on hisown behalf. 6. Skin/Wound Care:Routine skin checks 7. Fluids/Electrolytes/Nutrition: check bmet tomorrow 8. Post stroke dysphagia.   Advanced to D1 nectars 8/7  Aspiration precautions  Pt doesn't want NGT  -intake seems to be improving, ate 50, 100, 100% yesteday  Advance diet as tolerated  -continue IVF 9. History of CAD with myocardial infarction status post stenting. Continue Plavix. Patient [place]. [place] 10. Hypertension. Coreg 3.125 mg twice daily.   Hydralazine 10 3 times daily started on 7/31, increased to 25 3 times daily on 8/3  Norvasc 2.5 started on 8/1, increased to 5 on 8/2, increased to 7.5 on 8/3   Vitals:   07/27/19 2245 07/28/19 0536  BP: 129/88 (!) 156/100  Pulse: 87 100  Resp:  20  Temp:  98.2 F (36.8 C)  SpO2:  99%  labile will monitor    No changes indicated today 11. Hyperlipidemia. Lipitor 12. History of tobacco as well as marijuana use. Counseling 13. Systolic CHF  Echo with EF of 84%40%  Chest x-ray personally reviewed, suggestive of cardiomegaly  Filed Weights   07/26/19 0442 07/27/19 0444 07/28/19 0540  Weight: (!) 147.8 kg (!) 148.9 kg (!) 146.7 kg   Lasix 20 daily DC'd on 8/7 due to limited p.o. intake, will consider restarting if appropriate  Stable on 8/10 14.  CKD stage III  Creatinine 1.8 on 8/6,Improved to 1.66 IVF nightly started on 8/6 cont  Continue to monitor 15.  Morbid obesity: Encouraged weight loss 16. Sleep disturbance  Melatonin started on 8/4, increased on 8/6 DC'd on 8/7  Remeron 7.5 started on 8/7 ? Am sedation will d/c  LOS: 11 days A FACE TO [place] E Kellan Boehlke 07/28/2019, 7:57 AM

## 2019-07-28 NOTE — Progress Notes (Signed)
Occupational Therapy Session Note  Patient Details  Name: Zachary Mccann MRN: [id number] Date of Birth: Apr 07, 1971  Today's Date: 07/28/2019 OT Individual Time: 9024-0973 OT Individual Time Calculation (min): 49 min    Short Term Goals: Week 2:  OT Short Term Goal 1 (Week 2): Pt will complete bathing sit to stand with min assist. OT Short Term Goal 2 (Week 2): Pt will donn pullover shirt with min assist and mod demonstrational cueing. OT Short Term Goal 3 (Week 2): Pt will complete toileting with mod assist sit to stand. OT Short Term Goal 4 (Week 2): Pt will use the RUE as a gross assist with min facilitation during grooming and bathing task to hold items to be opened.  Skilled Therapeutic Interventions/Progress Updates:    Pt supine in bed at start of session.  Mod assist for transfer from supine to sit EOB on the right side.  He completed sit to stand with use of the RW for transfer to the wheelchair and exhibited bladder incontinence.  Once this was cleaned up he completed transfer stand pivot with mod assist and use of the RW.  Increased lean to the right and posteriorly with initial sit to stand.  Had him work at the sink on washing and drying peri area and buttocks as well as donning incontinence brief and new paper scrub pants.  Max assist for threading items over the RLE and min assist with mod demonstrational cueing for threading the left.  Max assist for pulling items over hips with mod assist for sit to stand and standing balance.  Finished session with total assist for donning gripper socks.  Pt left sitting up in the wheelchair with call button and phone in reach and safety alarm belt in place.  Pt with some movement in the RUE at [facility] III level in the arm and II in the hand.   Therapy Documentation Precautions:  Precautions Precautions: Fall Precaution Comments: right hemiparesis Restrictions Weight Bearing Restrictions: No  Pain: Pain Assessment Pain Scale:  Faces Pain Score: 0-No pain ADL: See Care Tool Section for some details of ADL  Therapy/Group: Individual Therapy  Zakariye Nee OTR/L 07/28/2019, 10:55 AM

## 2019-07-28 NOTE — Progress Notes (Signed)
Physical Therapy Session Note  Patient Details  Name: Zachary Mccann MRN: [id number] Date of Birth: [birth year]  Today's Date: 07/28/2019 PT Individual Time: 5027-7412 and 1547-1630 PT Individual Time Calculation (min): 72 min and 43 min    Short Term Goals: Week 2:  PT Short Term Goal 1 (Week 2): Pt will perform sit<>stand with min assist >75% of the time PT Short Term Goal 2 (Week 2): Pt will ambulate x 50 ft with LRAD and mod assist PT Short Term Goal 3 (Week 2): Pt will perform stand pivot transfers with mod assist and LRAD  Skilled Therapeutic Interventions/Progress Updates:    Session 1: Pt seated in w/c upon PT arrival, agreeable to therapy tx and denies pain. Pt transported to the gym. Pt performed stand pivot transfer this session with mod assist, initial posterior lean noted and uncontrolled decent onto the mat. Pt noted to be incontinent with a bowel smear. Pt transferred back to w/c with mod assist and RW, transported back to room. Pt performed x 2 sit<>stands with min-mod assist and RW while therapist and second helper assisted within clothing management and pericare total assist, changed briefs and pants with bowel smear noted. Pt gives thumbs down when asked if he needs to try using the bathroom. Therapist encouraged pt to try using urinal, pt maintained standing balance with min assist while using urinal, continent of bladder this session. Pt transported back to the gym. Stand pivot w/c<>mat with min-mod assist and RW, increased difficulty noted this session moving R LE requiring assist to help advance. Pt worked on anterior weightshift while seated reaching outside BOS for horseshoe toss activity. Pt worked on standing balance with RW while reaching for horseshoes with min assist. Attempted to work on R LE foot clearance while standing with RW to tap aerobic step, pt with increased difficulty performing task secondary to apraxia, attempted to try R LE kicking activity however pt unable  to perform despite verbal/tactile cues. Pt performed seated kicking with R LE x 3, increased time to complete with decreased motor planning. Pt transferred back to w/c min assist with RW. Pt ambulated x 10 ft with RW and mod assist +2 for safety, cues for foot placement and step length. Pt transported back to room, sit<>stand with min assist while therapist and 2nd helper adjusted briefs. Pt performed x 5 kicks while seated in w/c with R LE. Pt left in w/c with needs in reach and chair alarm set.    Session 2: Pt supine in bed upon PT arrival, agreeable to therapy tx and denies pain. Pt transferred to sitting EOB with mod assist and increased time for motor planning. Pt scooted forward to EOB with assist and cues for movement of R hip. Pt performed sit<>stand from bed with increased time and mod assist using RW, stand pivot to w/c with mod assist. Pt transported to the gym. Pt worked on standing balance this session while performing clohtespin reaching task, x 1 trial taking clothespins off basketball net and x 1 trial putting clothespins on net. Pt worked on R LE foot clearance and hip flexion in order to perform toe taps on 2 inch step, UE support on RW pt performed x 2 trials with min assist and cues for techniques. Pt performed sit<>stand with RW and mod assist, maintained standing balance with min assist while therapist assisted to adjust patients pants/briefs. Pt transported back to room and left in w/c with needs in reach and chair alarm set.    Therapy  Documentation Precautions:  Precautions Precautions: Fall Precaution Comments: right hemiparesis Restrictions Weight Bearing Restrictions: No    Therapy/Group: Individual Therapy  Cresenciano Genre, PT, DPT 07/28/2019, 7:53 AM

## 2019-07-28 NOTE — Progress Notes (Signed)
Speech Language Pathology Daily Session Note  Patient Details  Name: Zachary Mccann MRN: [id number] Date of Birth: [birth year]  Today's Date: 07/28/2019 SLP Individual Time: 0569-7948 SLP Individual Time Calculation (min): 45 min  Short Term Goals: Week 2: SLP Short Term Goal 1 (Week 2): Pt will consume dys 1 textures and nectar thick liquids with min cues for use of swallowing precautions and minimal overt s/s of aspiration. SLP Short Term Goal 2 (Week 2): Patient will consume trials of thin liquids with minimal overt s/sx of aspiration and min cues for use of swallowing precautions over 3 consecutive sessions prior to repeat instrumental assessment. SLP Short Term Goal 3 (Week 2): Pt will produce vocalizations in coordination with movement of articulators in 50% of opportunities with max assist multimodal cues. SLP Short Term Goal 4 (Week 2): Pt will produce non speech oral motor movements on command as a precursor to speech tasks  in >50% of opportunities with max assist multimodal cues.  Skilled Therapeutic Interventions:Skilled ST services focused on langauge skills. Pt was attempting to make a phone call via hospital phone upon entering room. Pt was able to imitate motor movements of open/closing mouth only and vocalized nonspeech sound , grunt x1. SLP facilitated phone call with girlfriend Zachary Mccann, pt wrote message at phrase/sentence level and SLP relayed information. Pt was unable to vocalize, however appeared to enjoy listening to Zachary Mccann and the children speak. SLP answered questions and provided education on current progress/treatment plan with pt's permission to Zachary Mccann. Pt was left in room with call bell within reach and chair alarm set. ST recommends to continue skilled ST services.      Pain Pain Assessment Pain Score: 0-No pain  Therapy/Group: Individual Therapy  Zachary Mccann  [facility] 07/28/2019, 12:20 PM

## 2019-07-29 ENCOUNTER — Encounter (HOSPITAL_COMMUNITY): Payer: Self-pay

## 2019-07-29 ENCOUNTER — Inpatient Hospital Stay (HOSPITAL_COMMUNITY): Payer: Self-pay | Admitting: Occupational Therapy

## 2019-07-29 ENCOUNTER — Inpatient Hospital Stay (HOSPITAL_COMMUNITY): Payer: Self-pay

## 2019-07-29 MED ORDER — CEPHALEXIN 250 MG PO CAPS
250.0000 mg | ORAL_CAPSULE | Freq: Three times a day (TID) | ORAL | Status: DC
  Administered 2019-07-29 – 2019-07-30 (×3): 250 mg via ORAL
  Filled 2019-07-29 (×3): qty 1

## 2019-07-29 MED ORDER — CARVEDILOL 6.25 MG PO TABS
6.2500 mg | ORAL_TABLET | Freq: Two times a day (BID) | ORAL | Status: DC
  Administered 2019-07-29 – 2019-08-12 (×27): 6.25 mg via ORAL
  Filled 2019-07-29 (×28): qty 1

## 2019-07-29 NOTE — Progress Notes (Signed)
Physical Therapy Session Note  Patient Details  Name: Zachary Mccann MRN: [id number] Date of Birth: 01/05/1971  Today's Date: 07/29/2019 PT Individual Time: 4401-0272 PT Individual Time Calculation (min): 71 min   Short Term Goals: Week 2:  PT Short Term Goal 1 (Week 2): Pt will perform sit<>stand with min assist >75% of the time PT Short Term Goal 2 (Week 2): Pt will ambulate x 50 ft with LRAD and mod assist PT Short Term Goal 3 (Week 2): Pt will perform stand pivot transfers with mod assist and LRAD  Skilled Therapeutic Interventions/Progress Updates:    Pt seated in w/c upon PT arrival, agreeable to therapy tx and denies pain. Pt performed sit<>stand with RW and mod assist, standing balance with min assist, therapist assisted with clothing management and provided pt with urinal to try to void before therapy, pt unable to void. Pt transported to gym in w/c. Pt worked on standing balance and R LE strength/hip flexion to perform x 10 toe taps on 2 inch step with UE support on RW, min assist with cues for techniques. Pt ambulated x 33 ft and x 70 ft this session with RW and min-mod assist, w/c follow for safety and cues for RW management at times. Pt performed stand pivot to the mat with RW and min assist, cues for techniques. Pt worked on sit<>stands without AD with emphasis on pre-extension/anterio weightshift, performed 2 x 5 with mod assist fading to min assist. Pt worked on dynamic standing balance this session while performing horseshoe toss activity without AD, x 2 trials with min assist. In sitting worked on R LE knee extension and hip flexion, active assist needed for hip flexion. In sitting worked on R elbow flexion with active assist. Pt performed stand pivot to w/c with RW and mod assist, transported back to room and left in w/c with needs in reach and chair alarm set.   Therapy Documentation Precautions:  Precautions Precautions: Fall Precaution Comments: right  hemiparesis Restrictions Weight Bearing Restrictions: No    Therapy/Group: Individual Therapy  Netta Corrigan, PT, DPT 07/29/2019, 8:13 AM

## 2019-07-29 NOTE — Progress Notes (Signed)
[place] PHYSICAL MEDICINE & REHABILITATION PROGRESS NOTE  Subjective/Complaints:  No issues overnite , working with OT   ROS: limited due to language/communication   Objective: Vital Signs: Blood pressure (!) 140/103, pulse (!) 101, temperature 98.6 F (37 C), resp. rate 20, height 6' (1.829 m), weight (!) 147.5 kg, SpO2 99 %. Dg Chest 2 View  Result Date: 07/28/2019 CLINICAL DATA:  Leukocytosis EXAM: CHEST - 2 VIEW COMPARISON:  July 17, 2019. FINDINGS: There is mild cardiomegaly. Shallow degree of aeration. No large airspace consolidation. No acute osseous abnormality. IMPRESSION: Shallow degree of aeration.  No acute cardiopulmonary process. Mild cardiomegaly Electronically Signed   By: Prudencio Pair M.D.   On: 07/28/2019 19:26   Ct Head Wo Contrast  Result Date: 07/27/2019 CLINICAL DATA:  Right-sided weakness. EXAM: CT HEAD WITHOUT CONTRAST TECHNIQUE: Contiguous axial images were obtained from the base of the skull through the vertex without intravenous contrast. COMPARISON:  CT scan of July 10, 2019. FINDINGS: Brain: Stable low density is seen in right periventricular white matter consistent with old infarction. Old left cerebellar infarction is noted. No acute infarction, mass lesion or hemorrhage is noted. No mass effect or midline shift is noted. Ventricular size is within normal limits. Vascular: No hyperdense vessel or unexpected calcification. Skull: Normal. Negative for fracture or focal lesion. Sinuses/Orbits: No acute finding. Other: None. IMPRESSION: No acute intracranial abnormality seen. Electronically Signed   By: Marijo Conception M.D.   On: 07/27/2019 13:33   Recent Labs    07/28/19 0918  WBC 10.8*  HGB 13.7  HCT 42.3  PLT 258   Recent Labs    07/27/19 1136  NA 139  K 4.2  CL 105  CO2 23  GLUCOSE 117*  BUN 19  CREATININE 1.66*  CALCIUM 9.7    Physical Exam: BP (!) 140/103 (BP Location: Left Arm)   Pulse (!) 101   Temp 98.6 F (37 C)   Resp 20   Ht 6'  (1.829 m)   Wt (!) 147.5 kg   SpO2 99%   BMI 44.10 kg/m  Constitutional: No distress . Vital signs reviewed. HEENT: EOMI, oral membranes moist Neck: supple Cardiovascular: RRR without murmur. No JVD    Respiratory: CTA Bilaterally without wheezes or rales. Normal effort    GI: BS +, non-tender, non-distended  Skin: Warm and dry.  Intact. Psych: Appears to be flat Musc: No edema.  No tenderness. Neurological:Alert Right facial weakness Nonverbal. Motor: LUE/LLE: Grossly 4/5 Right upper extremity: Shoulder abduction 2/5, elbow flexion/extension 2-/5, handgrip 1/5. Apraxic, stable Right lower extremity: 2-/5 hip/knee ext synergy  Assessment/Plan: 1. Functional deficits secondary to left brain infarct with history of CVA which require 3+ hours per day of interdisciplinary therapy in a comprehensive inpatient rehab setting.  Physiatrist is providing close team supervision and 24 hour management of active medical problems listed below.  Physiatrist and rehab team continue to assess barriers to discharge/monitor patient progress toward functional and medical goals  Care Tool:  Bathing    Body parts bathed by patient: Chest, Right arm, Abdomen, Front perineal area, Right upper leg, Left upper leg, Right lower leg, Left lower leg, Face   Body parts bathed by helper: Buttocks, Left arm     Bathing assist Assist Level: Moderate Assistance - Patient 50 - 74%     Upper Body Dressing/Undressing Upper body dressing   What is the patient wearing?: Pull over shirt    Upper body assist Assist Level: Moderate Assistance - Patient  50 - 74%    Lower Body Dressing/Undressing Lower body dressing      What is the patient wearing?: Incontinence brief, Pants     Lower body assist Assist for lower body dressing: Maximal Assistance - Patient 25 - 49%     Toileting Toileting    Toileting assist Assist for toileting: Moderate Assistance - Patient 50 - 74%     Transfers Chair/bed  transfer  Transfers assist  Chair/bed transfer activity did not occur: Safety/medical concerns(Decreased strength/balance)  Chair/bed transfer assist level: Moderate Assistance - Patient 50 - 74%     Locomotion Ambulation   Ambulation assist   Ambulation activity did not occur: Safety/medical concerns(decreased strength/balance/endurance)  Assist level: Moderate Assistance - Patient 50 - 74% Assistive device: Walker-rolling Max distance: 10 ft   Walk 10 feet activity   Assist  Walk 10 feet activity did not occur: Safety/medical concerns(decreased strength/balance/endurance)  Assist level: Moderate Assistance - Patient - 50 - 74% Assistive device: Walker-rolling   Walk 50 feet activity   Assist Walk 50 feet with 2 turns activity did not occur: Safety/medical concerns  Assist level: Moderate Assistance - Patient - 50 - 74% Assistive device: Walker-rolling    Walk 150 feet activity   Assist Walk 150 feet activity did not occur: Safety/medical concerns         Walk 10 feet on uneven surface  activity   Assist Walk 10 feet on uneven surfaces activity did not occur: Safety/medical concerns         Wheelchair     Assist   Type of Wheelchair: Manual Wheelchair activity did not occur: Safety/medical concerns(decreased strength/balance/endurance)  Wheelchair assist level: Maximal Assistance - Patient 25 - 49% Max wheelchair distance: 47f    Wheelchair 50 feet with 2 turns activity    Assist    Wheelchair 50 feet with 2 turns activity did not occur: Safety/medical concerns(decreased strength/balance/endurance)   Assist Level: Maximal Assistance - Patient 25 - 49%   Wheelchair 150 feet activity     Assist Wheelchair 150 feet activity did not occur: Safety/medical concerns(decreased strength/balance/endurance)          Medical Problem List and Plan: 1.Right side weakness, aphasia, dysphasiasecondary to left MCA and ACA scattered  infarct due to left ICA occlusion as well as history of previous stroke in the past and medical noncompliance  Continue CIR, PT, OT SLP, Team conference today please see physician documentation under team conference tab, met with team face-to-face to discuss problems,progress, and goals. Formulized individual treatment plan based on medical history, underlying problem and comorbidities. PRAFO/WHO for RLE and RUE 2. Antithrombotics: -DVT/anticoagulation:SCDs -antiplatelet therapy: plavix [id number] 3. Pain Management:Tylenol as needed 4. Mood:Provide emotional support -antipsychotic agents: N/A 5. Neuropsych: This patientappears to be capable of making decisions on hisown behalf. 6. Skin/Wound Care:Routine skin checks 7. Fluids/Electrolytes/Nutrition: check bmet tomorrow 8. Post stroke dysphagia.   Advanced to D1 nectars 8/7  Aspiration precautions  Pt doesn't want NGT  -intake seems to be improving, ate 50, 100, 100% yesteday  Advance diet as tolerated  -continue IVF 9. History of CAD with myocardial infarction status post stenting. Continue Plavix. Patient is followed by Dr. JoCarlyle Dolly0. Hypertension. Coreg 3.125 mg twice daily.   Hydralazine 10 3 times daily started on 7/31, increased to 25 3 times daily on 8/3  Norvasc 2.5 started on 8/1,  increased to 10on 8/4   Vitals:   07/28/19 1949 07/29/19 0534  BP: 138/89 (!) 140/103  Pulse: (!) 103 (![name]  101  Resp: 20 20  Temp: 98.2 F (36.8 C) 98.6 F (37 C)  SpO2: 100% 99%  labile will monitor , increase coreg due to tachy with elevated diastolic    No changes indicated today 11. Hyperlipidemia. Lipitor 12. History of tobacco as well as marijuana use. Counseling 13. Systolic CHF  Echo with EF of 40%  Chest x-ray personally reviewed, suggestive of cardiomegaly  Filed Weights   07/27/19 0444 07/28/19 0540 07/29/19 0534  Weight: (!) 148.9 kg (!) 146.7 kg (!) 147.5 kg   Lasix 20 daily  DC'd on 8/7 due to limited p.o. intake, will consider restarting if appropriate  Stable on 8/12 14.  CKD stage III  Creatinine 1.8 on 8/6,Improved to 1.66 IVF nightly started on 8/6 cont  Continue to monitor 15.  Morbid obesity: Encouraged weight loss 16. Sleep disturbance  Melatonin started on 8/4, increased on 8/6 DC'd on 8/7  Remeron 7.5 started on 8/7   LOS: 12 days A FACE TO [place] E Mantaj Chamberlin 07/29/2019, 7:53 AM

## 2019-07-29 NOTE — Plan of Care (Signed)
  Problem: RH Expression Communication Goal: LTG Patient will verbally express basic/complex needs(SLP) Description: LTG:  Patient will verbally express basic/complex needs, wants or ideas with cues  (SLP) Flowsheets (Taken 07/29/2019 0937) LTG: Patient will verbally express basic/complex needs, wants or ideas (SLP): (vocalize vowel/ early constants) Maximal Assistance - Patient 25 - 49%

## 2019-07-29 NOTE — Progress Notes (Signed)
Nutrition Follow-up  DOCUMENTATION CODES:   Morbid obesity  INTERVENTION:   - Continue Ensure Enlive po TID, each supplement provides 350 kcal and 20 grams of protein (thickened to nectar-thick consistency)  - Continue Magic cup TID with meals, each supplement provides 290 kcal and 9 grams of protein  - Continue Vital Cuisine Shake TID, each supplement provides 520 kcal and 22 grams of protein  - Continue MVI with minerals daily  NUTRITION DIAGNOSIS:   Inadequate oral intake related to dysphagia as evidenced by other (full liquid diet).  Progressing, pt now on Dysphagia 1 diet with nectar-thick liquids and PO intake improved  GOAL:   Patient will meet greater than or equal to 90% of their needs  Progressing  MONITOR:   PO intake, Supplement acceptance, Diet advancement, Labs, Weight trends, TF tolerance, I & O's  REASON FOR ASSESSMENT:   Consult Calorie Count  ASSESSMENT:   48 year old male with PMH of left cerebellar CVA September 2019, CAD non-STEMI status post stenting May 2019, HTN, CKD stage III, tobacco abuse, and medical noncompliance. Presented 07/10/19 with aphasia, right facial droop and right side weakness. UDS positive for marijuana. Cranial CT scan showd areas of prior infarction in the superior aspect of the left cerebellum. MRI/MRA showed occluded left internal carotid artery with reconstitution of the level of the left posterior communicating artery. Scattered punctate foci of cortical infarct involving the anterior left frontal lobe and left parietal lobe. Extensive left hemisphere acute subacute white matter infarcts involving the left centrum semiovale and corona radiata as well as border zone areas. Pt admitted to CIR on 7/31.  Noted target discharge date of 8/26.  Weight stable since admission.  Results of calorie count demonstrated pt was meeting >100% of his kcal needs all 3 days and 97-100% of his protein needs. Decision was made to not place  Cortrak feeding tube for nutrition support.  Attempted to meet with pt at bedside today. Pt on the phone and appeared to be listening to what was being said on the other end of the line. Pt did waved to RD but did not engage.  Will continue with current supplement regimen.  Meal Completion: 25-100% x last 8 meals (averaging 72%)  Medications reviewed and include: Ensure Enlive TID, Remeron 7.5 mg daily, MVI with minerals, Senna IVF: NS @ 100 ml/hr q HS for 12 hours  Labs reviewed.  Diet Order:   Diet Order            DIET - DYS 1 Room service appropriate? Yes; Fluid consistency: Nectar Thick  Diet effective now              EDUCATION NEEDS:   No education needs have been identified at this time  Skin:  Skin Assessment: Reviewed RN Assessment  Last BM:  07/28/19  Height:   Ht Readings from Last 1 Encounters:  07/24/19 6' (1.829 m)    Weight:   Wt Readings from Last 1 Encounters:  07/29/19 (!) 147.5 kg    Ideal Body Weight:  80.9 kg  BMI:  Body mass index is 44.1 kg/m.  Estimated Nutritional Needs:   Kcal:  2200-2400  Protein:  110-125 grams  Fluid:  >/= 2.0 L    Gaynell Face, MS, RD, LDN Inpatient Clinical Dietitian Pager: [phone] Weekend/After Hours: [phone]

## 2019-07-29 NOTE — Progress Notes (Signed)
Speech Language Pathology Daily Session Note  Patient Details  Name: ABDULKADIR EMMANUEL MRN: [id number] Date of Birth: [birth year]  Today's Date: 07/29/2019 SLP Individual Time: 4259-5638 SLP Individual Time Calculation (min): 57 min  Short Term Goals: Week 2: SLP Short Term Goal 1 (Week 2): Pt will consume dys 1 textures and nectar thick liquids with min cues for use of swallowing precautions and minimal overt s/s of aspiration. SLP Short Term Goal 2 (Week 2): Patient will consume trials of thin liquids with minimal overt s/sx of aspiration and min cues for use of swallowing precautions over 3 consecutive sessions prior to repeat instrumental assessment. SLP Short Term Goal 3 (Week 2): Pt will produce vocalizations in coordination with movement of articulators in 50% of opportunities with max assist multimodal cues. SLP Short Term Goal 4 (Week 2): Pt will produce non speech oral motor movements on command as a precursor to speech tasks  in >50% of opportunities with max assist multimodal cues.  Skilled Therapeutic Interventions: Skilled ST services focused on swallow and speech skills. SLP facilitated oral care piror to trials of thin, pt consumed x5 ice chips trials and x5 TSP thin liquid trials, with initial protective cough on TSP trial only, swallow appeared mostly timely and demonstrated second swallow in 50% of trials. Pt demonstrated mod anterior spillage of TSP trials and limited lingual movement with ice chips. Pt demonstrated ability to open/close mouth on command in 5 out 8 opportunities, press lips closed in 2 out 8 opportunities and unable to smile or bite bottom lip on command with mirror present for biofeedback. SLP also facilitated breakfast tray of dys 1 and NTL via cup, pt x1 cough on NTL following consecutive sips and mild anterior spillage with dys 1 and NTL. Pt was left in room with call bell within reach and chair alarm set. ST recommends to continue skilled ST services.       Pain Pain Assessment Pain Scale: 0-10 Pain Score: 0-No pain  Therapy/Group: Individual Therapy  Saida Lonon  [facility] 07/29/2019, 12:23 PM

## 2019-07-29 NOTE — Patient Care Conference (Signed)
Inpatient RehabilitationTeam Conference and Plan of Care Update Date: 07/29/2019   Time: 11:15 AM    Patient Name: Zachary Mccann      Medical Record Number: [id number]  Date of Birth: [birth year] Sex: Male         Room/Bed: 2P49I/2M41R-83 Payor Info: Payor: MEDICAID POTENTIAL / Plan: MEDICAID POTENTIAL / Product Type: *No Product type* /    Admitting Diagnosis: 6. CVA 2 Team  LT. CVA; 22-24days  Admit Date/Time:  07/17/2019  2:53 PM Admission Comments: No comment available   Primary Diagnosis:  <principal problem not specified> Principal Problem: <principal problem not specified>  Patient Active Problem List   Diagnosis Date Noted  . Labile blood pressure   . Sleep disturbance   . Benign hypertensive heart and kidney disease with diastolic CHF, NYHA class II and CKD stage III (HCC)   . Chronic systolic congestive heart failure (HCC)   . Morbid obesity (HCC)   . Uncontrolled hypertension   . Dysphagia, post-stroke   . Cardiomegaly   . Acute systolic congestive heart failure (HCC)   . 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 Cerebral embolism with cerebral infarction 07/11/2019  . Acute CVA (cerebrovascular accident) (HCC) 07/11/2019  . Slurred speech 07/10/2019  . Cardiomyopathy (HCC) 11/20/2018  . History of stroke 09/06/2018  . Tobacco use 09/06/2018  . History of non-ST elevation myocardial infarction (NSTEMI) 05/05/2018  . Severe Vitamin D deficiency 04/02/2018  . Community acquired pneumonia of left lower lobe of lung (HCC) 04/02/2018  . CKD (chronic kidney disease), stage III (HCC) = Secondary to Hypertension 04/02/2018  . Pneumonia 03/30/2018  . Metabolic acidosis 03/30/2018  . AKI (acute kidney injury) (HCC) 03/30/2018  . N&V (nausea and vomiting) 03/30/2018  . Essential hypertension 03/30/2018    Expected Discharge Date: Expected Discharge Date: 08/12/19  Team Members Present: Physician leading conference: Dr. Claudette Laws Social Worker  Present: Dossie Der, LCSW Nurse Present: Adora Fridge, RN PT Present: Woodfin Ganja, PT OT Present: Perrin Maltese, OT SLP Present: Colin Benton, SLP PPS Coordinator present : Fae Pippin, SLP     Current Status/Progress Goal Weekly Team Focus  Medical   Patient with little improvement in motor strength this week, UTI now started on empiric antibiotics until cultures back, CT head negative for bleed or new CVA  Reduce fall risk, reduce recurrent stroke risk  Start empiric Keflex, await cultures   Bowel/Bladder   incontinent of b/b, I/O cath q8hr >370ml if needed; LBM:08/11, scheduled miralax and senna  time tolieting when awake; gain regular bowel pattern  assist with tolieting needs prn   Swallow/Nutrition/ Hydration   dys 1 and NTL, mod A  Min A  trials of thin and swallow strategies for oral control   ADL's   Pt needs min assist for UB bathing with mod assist for LB bathing sit to stand.  Mod assist for UB dressing with max assist for LB dressing.  RUE function is at a Brunnstrum stage III level in the arm and hand.  Mod assist for stand pivot transfers.  min assist overall  Neuromuscular re-education, selfcare retraining, balance retraining, transfer training, therapeutic exercise, pt/family education   Mobility   mod-max assist bed mobility, min-mod assist for sit<>stands and transfers, mod assist gait with RW 10-20 ft +2 for w/c follow  min assist bed mobility, transfers, and gait 19ft; mod assist 2 stair navigation; and supervision w/c propulsion  NMR, motor planning, cognition, balance, transfers, education, awareness   Communication  Min A multimodal, Total A vocalize  Mod I multimodal, Max A vocalize  oral motor movements, vocalize vowels/earky consontants, phrase/sentence level awareness of spelling errors   Safety/Cognition/ Behavioral Observations            Pain   non-verbal, no facial expression of pain  remain pain free  assess pain level QS and prn   Skin    skin intact  maintain skin intergrity  assess skin QS and prn      *See Care Plan and progress notes for long and short-term goals.     Barriers to Discharge  Current Status/Progress Possible Resolutions Date Resolved   Physician    Medical stability;Incontinence;Weight     Little progress towards goals  Recheck urine culture results when available, caregiver training      Nursing                  PT                    OT                  SLP                SW                Discharge Planning/Teaching Needs:  Zachary Mccann has been in and seen him in therapies-plans to provide 24 hr care at DC. Pt making progress in therapies and pleased he can maintain on liquid diet does not want Cortrak      Team Discussion:  Progressing slowly not as much as last week. MD reports UTI starting treatment today. Diet upgraded to Dys 1 nectar thick liquids. IV fluids at night. CTT scan no new infarcts or bleeds. Leans in transfers. Nursing doing PVR's regarding voiding. Goals of min assist, currently mod/max level. MD monitoring labs. Hopefully will do better once feels better. Fiance wants to take home and care for him  Revisions to Treatment Plan:  DC 8/26    Continued Need for Acute Rehabilitation Level of Care: The patient requires daily medical management by a physician with specialized training in physical medicine and rehabilitation for the following conditions: Daily direction of a multidisciplinary physical rehabilitation program to ensure safe treatment while eliciting the highest outcome that is of practical value to the patient.: Yes Daily medical management of patient stability for increased activity during participation in an intensive rehabilitation regime.: Yes Daily analysis of laboratory values and/or radiology reports with any subsequent need for medication adjustment of medical intervention for : Blood pressure problems;Neurological problems;Other   I attest that I was present, lead  the team conference, and concur with the assessment and plan of the team. Teleconference held due to COVID 19   Eulan Heyward, [place] Rhyme 07/29/2019, 3:20 PM

## 2019-07-29 NOTE — Progress Notes (Signed)
Occupational Therapy Session Note  Patient Details  Name: Zachary Mccann MRN: [id number] Date of Birth: 04-18-1971  Today's Date: 07/29/2019 OT Individual Time: 3710-6269 OT Individual Time Calculation (min): 58 min    Short Term Goals: Week 2:  OT Short Term Goal 1 (Week 2): Pt will complete bathing sit to stand with min assist. OT Short Term Goal 2 (Week 2): Pt will donn pullover shirt with min assist and mod demonstrational cueing. OT Short Term Goal 3 (Week 2): Pt will complete toileting with mod assist sit to stand. OT Short Term Goal 4 (Week 2): Pt will use the RUE as a gross assist with min facilitation during grooming and bathing task to hold items to be opened.  Skilled Therapeutic Interventions/Progress Updates:    Pt completed shower and dressing during session.  He was able to transfer from supine to sit EOB with mod assist.  Mod assist for transfer from the bed to the wheelchair for transport to the bathroom.  Mod assist for stand pivot transfer from the wheelchair to the tub bench.  Min assist for UB bathing with mod assist for LB bathing with use of a LH sponge for washing the LEs.  Mod instructional cueing for sequencing task.  Max hand over hand for RUE use to wash the left arm.  He transferred back out to the wheelchair for work on dressing.  Max assist for pullover paper scrub, max assist for donning incontinence brief as well as pants with attempted use of the reacher for donning items over his LEs.  Total assist for donning gripper socks.  Finished session with pt in the wheelchair at bedside with call button and phone in reach and safety belt in place.    Therapy Documentation Precautions:  Precautions Precautions: Fall Precaution Comments: right hemiparesis Restrictions Weight Bearing Restrictions: No  Pain: Pain Assessment Pain Scale: 0-10 Pain Score: 0-No pain ADL: See Care Tool Section for some details of ADLs  Therapy/Group: Individual  Therapy  Talah Cookston OTR/L 07/29/2019, 11:27 AM

## 2019-07-29 NOTE — Progress Notes (Signed)
Social Work Patient ID: Zachary Mccann, male   DOB: [birth year], 48 y.o.   MRN: [id number]  Met with pt and spoke with [name]-fiance to discuss team conference goals min assist level and target discharge 8/26. Pt is not feeling well today gave me the thumbs down. Discussed he has a UTI and MD will be giving him medication for this and he would start feeling better by tomorrow. He is progressing with his diet and he is glad for this. [name] informed her Lucianne Lei had brake issues and now they have been fixed so she can come and see him tomorrow. He will be looking forward to this. Will continue to work on discharge needs and have [name] come in for education closer to discharge date.

## 2019-07-29 NOTE — Progress Notes (Signed)
Physical Therapy Weekly Progress Note  Patient Details  Name: Zachary Mccann MRN: [id number] Date of Birth: 04/11/1971  Beginning of progress report period: July 18, 2019 End of progress report period: July 29, 2019   Patient has met 4 of 5 short term goals.  Pt is progressing with functional mobility and can perform transfers sit<>stands with mod-max assist. Pt has been limited this week secondary to a UTI but is still tolerating therapy tx. Pt requires continues therapy to address balance, awareness, strength, and hemiparesis.   Patient continues to demonstrate the following deficits muscle weakness, impaired timing and sequencing, ataxia, decreased coordination and decreased motor planning, decreased initiation, decreased attention and decreased awareness and decreased sitting balance, decreased standing balance, decreased postural control and hemiplegia and therefore will continue to benefit from skilled PT intervention to increase functional independence with mobility.  Patient progressing toward long term goals..  Continue plan of care.  PT Short Term Goals Week 1:  PT Short Term Goal 1 (Week 1): Patient will perform rolling in bed with min A with use of bed rails. PT Short Term Goal 1 - Progress (Week 1): Progressing toward goal PT Short Term Goal 2 (Week 1): Patient will perform supine to sit and sit to supine with mod A. PT Short Term Goal 2 - Progress (Week 1): Met PT Short Term Goal 3 (Week 1): Patient will perform sit<> stand with max A of 1 person using LRAD. PT Short Term Goal 3 - Progress (Week 1): Met PT Short Term Goal 4 (Week 1): Patient will perform basic transfers with max A of 1 person. PT Short Term Goal 4 - Progress (Week 1): Met PT Short Term Goal 5 (Week 1): Patient will initiate gait training. PT Short Term Goal 5 - Progress (Week 1): Met Week 2:  PT Short Term Goal 1 (Week 2): Pt will perform sit<>stand with min assist >75% of the time PT Short Term Goal 2  (Week 2): Pt will ambulate x 50 ft with LRAD and mod assist PT Short Term Goal 3 (Week 2): Pt will perform stand pivot transfers with mod assist and LRAD  Skilled Therapeutic Interventions/Progress Updates:  Ambulation/gait training;Discharge planning;Functional mobility training;Psychosocial support;Therapeutic Activities;Visual/perceptual remediation/compensation;Wheelchair propulsion/positioning;Therapeutic Exercise;Skin care/wound management;Neuromuscular re-education;Disease management/prevention;Balance/vestibular training;Cognitive remediation/compensation;DME/adaptive equipment instruction;Pain management;Splinting/orthotics;UE/LE Strength taining/ROM;Community reintegration;Functional electrical stimulation;Patient/family education;Stair training;UE/LE Coordination activities     Therapy/Group: Individual Therapy  Netta Corrigan, PT, DPT 07/29/2019, 12:57 PM

## 2019-07-30 ENCOUNTER — Inpatient Hospital Stay (HOSPITAL_COMMUNITY): Payer: Self-pay | Admitting: Physical Therapy

## 2019-07-30 ENCOUNTER — Inpatient Hospital Stay (HOSPITAL_COMMUNITY): Payer: Self-pay | Admitting: Speech Pathology

## 2019-07-30 ENCOUNTER — Inpatient Hospital Stay (HOSPITAL_COMMUNITY): Payer: Self-pay | Admitting: Occupational Therapy

## 2019-07-30 ENCOUNTER — Inpatient Hospital Stay (HOSPITAL_COMMUNITY): Payer: Self-pay

## 2019-07-30 LAB — URINE CULTURE: Culture: >=100000 — AB

## 2019-07-30 MED ORDER — NITROFURANTOIN MONOHYD MACRO 100 MG PO CAPS
100.0000 mg | ORAL_CAPSULE | Freq: Two times a day (BID) | ORAL | Status: DC
  Administered 2019-07-30 – 2019-08-10 (×24): 100 mg via ORAL
  Filled 2019-07-30 (×24): qty 1

## 2019-07-30 MED ORDER — SODIUM CHLORIDE 0.9 % IV SOLN
INTRAVENOUS | Status: DC
  Administered 2019-07-30 – 2019-08-04 (×7): via INTRAVENOUS

## 2019-07-30 NOTE — Progress Notes (Signed)
Physical Therapy Session Note  Patient Details  Name: Zachary Mccann MRN: [id number] Date of Birth: [birth year]  Today's Date: 07/30/2019 PT Individual Time: 5110-2111 PT Individual Time Calculation (min): 72 min   Short Term Goals: Week 2:  PT Short Term Goal 1 (Week 2): Pt will perform sit<>stand with min assist >75% of the time PT Short Term Goal 2 (Week 2): Pt will ambulate x 50 ft with LRAD and mod assist PT Short Term Goal 3 (Week 2): Pt will perform stand pivot transfers with mod assist and LRAD  Skilled Therapeutic Interventions/Progress Updates:    Pt received sitting in w/c and agreeable to therapy session. Pt continues to reply with thumbs up/down for yes/no in response to therapist's questions - pt encouraged to attempt speaking. Pt reports he is ready to go home and therapist provided encouragement to continue working hard while in rehab to increase independence prior to D/C - pt responded with thumbs up.  Transported to/from gym in w/c. Sit>stand w/c>RW requiring 2 trials with manual facilitation for increased anterior trunk lean and mod assist to achieve upright - mod cuing for proper UE placement for safety with AD. Ambulated 24ft x2 (seated break between) using RW and mod assist for balance with manual facilitation for increased L lateral weight shift and increased R hip flexion during swing phase to increase R LE step length and foot clearance. Stand pivot w/c>EOM using RW with mod assist for coming into standing (manual facilitation for increased anterior weight shift) and mod assist for balance while turning - mod/max cuing for sequencing of AD and LE stepping. Sit<>stand EOM<>RW with min/mod assist for lifting - requires min assist for R hand placement on RW orthotic throughout session - demonstrates improved ability to come to standing when provided manual facilitation to maintain anterior trunk lean - poor eccentric control on descent. Standing R LE NMR focusing on swing phase to  improve hip/knee flexion and L lateral weight shift to increased R LE foot clearance and step length - standing with RW and min/mod assist for balance while stepping R LE up/down on 4" step - pt only able to get forefoot onto step despite max cuing and manual facilitation for increased L lateral weight shift and R hip flexion. Pt reports needing to use bathroom. Stand pivot EOM>w/c using RW with mod assist for balance and mod/max cuing for sequencing. Transported back to room and into bathroom in w/c. Stand pivot w/c>BSC over toilet using grab bars with mod assist for balance and +2 total assist for lower body clothing management due to urgency. Pt continent of bowels. Sit>stand BSC over toilet>grab bar with min assist. Performed posterior peri-care using L UE with set-up assistance followed by total assist of 2nd person to ensure cleanliness with min assist for balance - total assist LB clothing management with min assist for balance with L UE support on grab bar. Stand pivot to w/c with mod assist. Pt requesting to return to bed. Stand pivot w/c>EOB using RW with mod assist. Sit>supine with max assist for B LE management and max cuing for sequencing to increase independence. Pt left supine in bed with needs in reach and bed alarm on.   Therapy Documentation Precautions:  Precautions Precautions: Fall Precaution Comments: right hemiparesis Restrictions Weight Bearing Restrictions: No  Pain:  Verbalized "owe" during LB clothing management before sitting on toilet but unable to provide additional details due to aphasia and no other complaints of pain during session.   Therapy/Group: Individual Therapy  Tawana Scale, PT, DPT 07/30/2019, 3:48 PM

## 2019-07-30 NOTE — Progress Notes (Signed)
Occupational Therapy Session Note  Patient Details  Name: Zachary Mccann MRN: [id number] Date of Birth: [birth year]  Today's Date: 07/30/2019 OT Individual Time: 0800-0903 OT Individual Time Calculation (min): 63 min    Short Term Goals: Week 2:  OT Short Term Goal 1 (Week 2): Pt will complete bathing sit to stand with min assist. OT Short Term Goal 2 (Week 2): Pt will donn pullover shirt with min assist and mod demonstrational cueing. OT Short Term Goal 3 (Week 2): Pt will complete toileting with mod assist sit to stand. OT Short Term Goal 4 (Week 2): Pt will use the RUE as a gross assist with min facilitation during grooming and bathing task to hold items to be opened.  Skilled Therapeutic Interventions/Progress Updates:    Pt transferred from supine to sit EOB with mod assist and HOB elevated up approximately 45 degrees.  Mod assist for transfer to the wheelchair with use of the RW.  Pt pointing to the bathroom during transfer to indicate need to toilet.  Mod assist with max instructional cueing to complete functional mobility to the 3:1 over the toilet.  Max assist for clothing management and toilet hygiene with very small amount of BM noted.  He ambulated back out to the wheelchair with mod assist to complete grooming tasks at the sink.  Max hand over hand assist needed to integrate the RUE into holding toothpaste tube to remove the top.  He then needed min assist for brushing his teeth in order to suction oral secretions completely out.  Therapist assist with all UB and LB dressing secondary to time during session with pt completing sit to stand transitions throughout session with min assist using RW with min instructional cueing for hand placement.  Pt left sitting in the wheelchair with call button and phone in reach and safety belt in place.    Therapy Documentation Precautions:  Precautions Precautions: Fall Precaution Comments: right hemiparesis Restrictions Weight Bearing  Restrictions: No   Pain: Pain Assessment Pain Scale: Faces Pain Score: 0-No pain Faces Pain Scale: No hurt ADL: See Care Tool Section for some details of toileting and mobility  Therapy/Group: Individual Therapy  Gaynor Genco OTR/L 07/30/2019, 9:54 AM

## 2019-07-30 NOTE — Plan of Care (Signed)
  Problem: Consults Goal: RH STROKE PATIENT EDUCATION Description: See Patient Education module for education specifics  Outcome: Progressing   Problem: RH BOWEL ELIMINATION Goal: RH STG MANAGE BOWEL WITH ASSISTANCE Description: STG Manage Bowel with Min Assistance. Outcome: Progressing   Problem: RH BLADDER ELIMINATION Goal: RH STG MANAGE BLADDER WITH ASSISTANCE Description: STG Manage Bladder With Min Assistance Outcome: Progressing   Problem: RH COGNITION-NURSING Goal: RH STG ANTICIPATES NEEDS/CALLS FOR ASSIST W/ASSIST/CUES Description: STG Anticipates Needs/Calls for Assist With supervision Assistance/Cues. Outcome: Progressing   Problem: RH KNOWLEDGE DEFICIT Goal: RH STG INCREASE KNOWLEDGE OF STROKE PROPHYLAXIS Description: Pt will demonstrate increased knowledge of stroke prevention including diet management, medication regimen, and follow up care with primary care physician at the time of discharge with min assist/cues.  Outcome: Progressing   

## 2019-07-30 NOTE — Progress Notes (Signed)
Speech Language Pathology Daily Session Note  Patient Details  Name: Zachary Mccann MRN: [id number] Date of Birth: [birth year]  Today's Date: 07/30/2019 SLP Individual Time: 1015-1100 SLP Individual Time Calculation (min): 45 min  Short Term Goals: Week 2: SLP Short Term Goal 1 (Week 2): Pt will consume dys 1 textures and nectar thick liquids with min cues for use of swallowing precautions and minimal overt s/s of aspiration. SLP Short Term Goal 2 (Week 2): Patient will consume trials of thin liquids with minimal overt s/sx of aspiration and min cues for use of swallowing precautions over 3 consecutive sessions prior to repeat instrumental assessment. SLP Short Term Goal 3 (Week 2): Pt will produce vocalizations in coordination with movement of articulators in 50% of opportunities with max assist multimodal cues. SLP Short Term Goal 4 (Week 2): Pt will produce non speech oral motor movements on command as a precursor to speech tasks  in >50% of opportunities with max assist multimodal cues.  Skilled Therapeutic Interventions:  Skilled treatment session focused on communication and dysphagia goals. SLP recieved pt upright and pt agreeable to consuming ice chips following oral care. Pt had pen and paper in front of him and perseveratively wrote down that he wanted to go home. Pt listed that he had been at hospital too long and he was ready to go home. Encouragement and support provided. Skilled observation of pt consuming ice chips yielded munch mastication with lips closed during all trials. Min right anterior spillage noted. Pt indicated (thumbs up) that he had swallow but no hyoid movement was visualized or felt at neck and then pt noted to have accumulating liquid in mouth with each trial. When hyoid movement felt, pt presented with cough. Pt's swallow continues to appear impaired d/t timing difficulties noted on previous MBS. When hyoid movement felt, coughing also occurred. Pt left upright in  wheelchair with all needs within reach. Continue per current plan of care.      Pain    Therapy/Group: Individual Therapy  Zachary Mccann 07/30/2019, 1:09 PM

## 2019-07-30 NOTE — Progress Notes (Signed)
[place] PHYSICAL MEDICINE & REHABILITATION PROGRESS NOTE  Subjective/Complaints:  Reviewed urine cx , reviewed I/O and weights , per OT still incont of bladder. amb a little better  ROS: limited due to language/communication   Objective: Vital Signs: Blood pressure 138/83, pulse 83, temperature 97.8 F (36.6 C), temperature source Oral, resp. rate 18, height 6' (1.829 m), weight (!) 148.4 kg, SpO2 99 %. Dg Chest 2 View  Result Date: 07/28/2019 CLINICAL DATA:  Leukocytosis EXAM: CHEST - 2 VIEW COMPARISON:  July 17, 2019. FINDINGS: There is mild cardiomegaly. Shallow degree of aeration. No large airspace consolidation. No acute osseous abnormality. IMPRESSION: Shallow degree of aeration.  No acute cardiopulmonary process. Mild cardiomegaly Electronically Signed   By: Jonna ClarkBindu  Avutu M.D.   On: 07/28/2019 19:26   Recent Labs    07/28/19 0918  WBC 10.8*  HGB 13.7  HCT 42.3  PLT 258   Recent Labs    07/27/19 1136  NA 139  K 4.2  CL 105  CO2 23  GLUCOSE 117*  BUN 19  CREATININE 1.66*  CALCIUM 9.7    Physical Exam: BP 138/83 (BP Location: Left Arm)   Pulse 83   Temp 97.8 F (36.6 C) (Oral)   Resp 18   Ht 6' (1.829 m)   Wt (!) 148.4 kg   SpO2 99%   BMI 44.37 kg/m  Constitutional: No distress . Vital signs reviewed. HEENT: EOMI, oral membranes moist Neck: supple Cardiovascular: RRR without murmur. No JVD    Respiratory: CTA Bilaterally without wheezes or rales. Normal effort    GI: BS +, non-tender, non-distended  Skin: Warm and dry.  Intact. Psych: Appears to be flat Musc: No edema.  No tenderness. Neurological:Alert Right facial weakness Nonverbal. Motor: LUE/LLE: Grossly 4/5 Right upper extremity: Shoulder abduction 2/5, elbow flexion/extension 2-/5, handgrip 1/5. Apraxic, stable Right lower extremity: 2-/5 hip/knee ext synergy  Assessment/Plan: 1. Functional deficits secondary to left brain infarct with history of CVA which require 3+ hours per day of  interdisciplinary therapy in a comprehensive inpatient rehab setting.  Physiatrist is providing close team supervision and 24 hour management of active medical problems listed below.  Physiatrist and rehab team continue to assess barriers to discharge/monitor patient progress toward functional and medical goals  Care Tool:  Bathing    Body parts bathed by patient: Chest, Right arm, Abdomen, Front perineal area, Right upper leg, Left upper leg, Right lower leg, Left lower leg, Face   Body parts bathed by helper: Buttocks, Left arm     Bathing assist Assist Level: Moderate Assistance - Patient 50 - 74%     Upper Body Dressing/Undressing Upper body dressing   What is the patient wearing?: Pull over shirt    Upper body assist Assist Level: Maximal Assistance - Patient 25 - 49%    Lower Body Dressing/Undressing Lower body dressing      What is the patient wearing?: Incontinence brief, Pants     Lower body assist Assist for lower body dressing: Maximal Assistance - Patient 25 - 49%     Toileting Toileting    Toileting assist Assist for toileting: Moderate Assistance - Patient 50 - 74%     Transfers Chair/bed transfer  Transfers assist  Chair/bed transfer activity did not occur: Safety/medical concerns(Decreased strength/balance)  Chair/bed transfer assist level: Moderate Assistance - Patient 50 - 74%     Locomotion Ambulation   Ambulation assist   Ambulation activity did not occur: Safety/medical concerns(decreased strength/balance/endurance)  Assist level: Moderate Assistance -  Patient 50 - 74% Assistive device: Walker-rolling Max distance: 70 ft   Walk 10 feet activity   Assist  Walk 10 feet activity did not occur: Safety/medical concerns(decreased strength/balance/endurance)  Assist level: Moderate Assistance - Patient - 50 - 74% Assistive device: Walker-rolling   Walk 50 feet activity   Assist Walk 50 feet with 2 turns activity did not occur:  Safety/medical concerns  Assist level: Moderate Assistance - Patient - 50 - 74% Assistive device: Walker-rolling    Walk 150 feet activity   Assist Walk 150 feet activity did not occur: Safety/medical concerns         Walk 10 feet on uneven surface  activity   Assist Walk 10 feet on uneven surfaces activity did not occur: Safety/medical concerns         Wheelchair     Assist   Type of Wheelchair: Manual Wheelchair activity did not occur: Safety/medical concerns(decreased strength/balance/endurance)  Wheelchair assist level: Maximal Assistance - Patient 25 - 49% Max wheelchair distance: 70ft    Wheelchair 50 feet with 2 turns activity    Assist    Wheelchair 50 feet with 2 turns activity did not occur: Safety/medical concerns(decreased strength/balance/endurance)   Assist Level: Maximal Assistance - Patient 25 - 49%   Wheelchair 150 feet activity     Assist Wheelchair 150 feet activity did not occur: Safety/medical concerns(decreased strength/balance/endurance)          Medical Problem List and Plan: 1.Right side weakness, aphasia, dysphasiasecondary to left MCA and ACA scattered infarct due to left ICA occlusion as well as history of previous stroke in the past and medical noncompliance  Continue CIR, PT, OT SLP, slow progress likely due to UTI  PRAFO/WHO for RLE and RUE 2. Antithrombotics: -DVT/anticoagulation:SCDs -antiplatelet therapy: plavix 75mg daily 3. Pain Management:Tylenol as needed 4. Mood:Provide emotional support -antipsychotic agents: N/A 5. Neuropsych: This patientappears to be capable of making decisions on hisown behalf. 6. Skin/Wound Care:Routine skin checks 7. Fluids/Electrolytes/Nutrition: check bmet tomorrow 8. Post stroke dysphagia.   Advanced to D1 nectars 8/7  Aspiration precautions  Pt doesn't want NGT  -intake fair 25-70% meals   Advance diet as tolerated  -continue IVF 9.  History of CAD with myocardial infarction status post stenting. Continue Plavix. Patient is followed by Dr. Dina Rich 10. Hypertension. Coreg 3.125 mg twice daily.   Hydralazine 10 3 times daily started on 7/31, increased to 25 3 times daily on 8/3  Norvasc 2.5 started on 8/1,  increased to 10on 8/4   Vitals:   07/29/19 1930 07/30/19 0549  BP: 119/87 138/83  Pulse: 96 83  Resp: 18 18  Temp: 99.1 F (37.3 C) 97.8 F (36.6 C)  SpO2: 98% 99%  Controlled 8/13    No changes indicated today 11. Hyperlipidemia. Lipitor 12. History of tobacco as well as marijuana use. Counseling 13. Systolic CHF  Echo with EF of 93%  Chest x-ray personally reviewed, suggestive of cardiomegaly  Filed Weights   07/28/19 0540 07/29/19 0534 07/29/19 1900  Weight: (!) 146.7 kg (!) 147.5 kg (!) 148.4 kg   Lasix 20 daily DC'd on 8/7 due to limited p.o. intake, will consider restarting if appropriate  8/13 a bit up, monitor trend,  reduce IVF  14.  CKD stage III  Creatinine 1.8 on 8/6,Improved to 1.66 IVF nightly started on 8/6 cont, reduce to 68ml/hr recheck BMET in am   Continue to monitor 15.  Morbid obesity: Encouraged weight loss 16. Sleep disturbance  Melatonin started on 8/4,  increased on 8/6 DC'd on 8/7  Remeron 7.5 started on 8/7 17.  UTI not S to keflex will d/c and start macrobid  LOS: 13 days A FACE TO [place] E  07/30/2019, 8:02 AM

## 2019-07-30 NOTE — Progress Notes (Signed)
Physical Therapy Session Note  Patient Details  Name: Zachary Mccann MRN: [id number] Date of Birth: 08/25/1971  Today's Date: 07/30/2019 PT Individual Time: 1130-1200 PT Individual Time Calculation (min): 30 min   Short Term Goals: Week 2:  PT Short Term Goal 1 (Week 2): Pt will perform sit<>stand with min assist >75% of the time PT Short Term Goal 2 (Week 2): Pt will ambulate x 50 ft with LRAD and mod assist PT Short Term Goal 3 (Week 2): Pt will perform stand pivot transfers with mod assist and LRAD  Skilled Therapeutic Interventions/Progress Updates:    Pt presents in w/c and appears to be uncomfortable with tightness of dispoable scrub pants. Using thumbs up/down, able to discern that they are too tight and his brief is sliding down. Several attempts for sit <> stand from w/c with RW requiring max assist as pt with signficant posterior lean and unable to achieve full upright position. Once up, able to adjust pants with min assist for standing balance. Once seated, pt with complaints again. Family member arrived and brought new clothing so offered to change clothes and agreeable. Pt able to perform sit <> stand 2 more times with mod assist with facilitation for anterior weightshift and cues for hand placement. Pt requires overall max assist for doffing and donning new clothing with hemi-technique with cues by PT for sequencing and attention to R side and extra time.   Therapy Documentation Precautions:  Precautions Precautions: Fall Precaution Comments: right hemiparesis Restrictions Weight Bearing Restrictions: No Pain:  "Thumbs down" for pain.   Therapy/Group: Individual Therapy  [name], PT, DPT, CBIS  07/30/2019, 1:08 PM

## 2019-07-31 ENCOUNTER — Inpatient Hospital Stay (HOSPITAL_COMMUNITY): Payer: Self-pay | Admitting: Physical Therapy

## 2019-07-31 ENCOUNTER — Inpatient Hospital Stay (HOSPITAL_COMMUNITY): Payer: Self-pay | Admitting: Occupational Therapy

## 2019-07-31 ENCOUNTER — Inpatient Hospital Stay (HOSPITAL_COMMUNITY): Payer: Self-pay | Admitting: Speech Pathology

## 2019-07-31 DIAGNOSIS — I69359 Hemiplegia and hemiparesis following cerebral infarction affecting unspecified side: Secondary | ICD-10-CM

## 2019-07-31 LAB — BASIC METABOLIC PANEL
Anion gap: 10 (ref 5–15)
BUN: 22 mg/dL — ABNORMAL HIGH (ref 6–20)
CO2: 23 mmol/L (ref 22–32)
Calcium: 9.2 mg/dL (ref 8.9–10.3)
Chloride: 106 mmol/L (ref 98–111)
Creatinine, Ser: 1.57 mg/dL — ABNORMAL HIGH (ref 0.61–1.24)
GFR calc Af Amer: 60 mL/min — ABNORMAL LOW (ref >60)
GFR calc non Af Amer: 51 mL/min — ABNORMAL LOW (ref >60)
Glucose, Bld: 104 mg/dL — ABNORMAL HIGH (ref 70–99)
Potassium: 4.5 mmol/L (ref 3.5–5.1)
Sodium: 139 mmol/L (ref 135–145)

## 2019-07-31 NOTE — Progress Notes (Signed)
Physical Therapy Session Note  Patient Details  Name: Zachary Mccann MRN: [id number] Date of Birth: 1971/08/17  Today's Date: 07/31/2019 PT Individual Time: 1420-1519 and 1829-9371 PT Individual Time Calculation (min): 59 min and 20 min  Today's Date: 07/31/2019 PT Missed Time: 10 Minutes Missed Time Reason: Patient fatigue  Short Term Goals: Week 2:  PT Short Term Goal 1 (Week 2): Pt will perform sit<>stand with min assist >75% of the time PT Short Term Goal 2 (Week 2): Pt will ambulate x 50 ft with LRAD and mod assist PT Short Term Goal 3 (Week 2): Pt will perform stand pivot transfers with mod assist and LRAD  Skilled Therapeutic Interventions/Progress Updates:    Session 1: Pt received sitting in w/c and agreeable to therapy session. Pt continues to use thumbs up/down as yes/no in response to therapist's questions. Pt gesturing towards windows and with questioning able to determine he is ready to go home - pt educated on estimated discharge date and continued need for rehab and pt replies with thumbs up.  Transported to/from gym in w/c. Ambulated 34ft x2 (seated break between) using RW with min assist for straight gait and mod assist for turning with max cuing for sequencing of AD management and LE stepping (especially R LE) - manual facilitation for increased L lateral weight shift during L stance phase as well as for increased R hip flexion during swing phase to increase step length and foot clearance - max cuing throughout to improve R LE swing phase. Standing R LE NMR via repeated R LE step up/down on 6" step with B UE support on bilateral stair handrails and min assist/CGA for balance - targeting hip/knee flexors - demonstrates ability to get forefoot up on step but unable to get whole foot up - demonstrates increased difficulty stepping backwards with R LE off of step requiring assist ~75% of the time.  Stand pivot w/c<>EOM using RW with min/mod assist for balance and max cuing for  sequencing of AD management and LE stepping (pt with increased difficulty stepping backwards with R LE today). Sit<>stands w/c/EOM<>RW with min/mod assist for lifting/lowering during therapy session - continue to provide cuing and manual facilitation for increased and maintained anterior trunk lean upon coming to standing - continues to require min assist to place R UE on RW orthotic. Performed the following dynamic standing balance tasks while standing with R UE support on RW: - reaching inside and slightly outside BOS to grasp horsehoes from low and high surfaces and toss towards peg, all with CGA/min assist for balance - cross body reaching to grasp cups and turn to place them on the mat table with CGA/min assist for balance while pt stepping forward/backwards with L LE Stand pivot back to w/c as described above and transported back to room in w/c. Pt left sitting in w/c with needs in reach and seat belt alarm on.  Session 2: Pt received sitting in w/c and not agreeable to going to therapy gym but rather is requesting for assistance to return to bed. Pt agreeable to perform exercises in the bed. Stand pivot transfer w/c>EOB using RW with mod assist for lifting into standing (continued manual cuing for anterior weight shift) and balance while turning - mod cuing for sequencing of LE stepping and AD management while turning with manual facilitation for increased L weight shift to improved R LE step. Sitting EOB performed x15 reps of R LE long arc quads targeting NMR for quad activation with cuing for full  extension. Sit>supine with mod assist for trunk descent and R LE management. Pt initiated doffing LB clothing - therapist assisted with R LE position to perform bridge (pt only able to clear L hip) and completed LB clothing management with max assist. Supine R LE NMR to perform heel slides with pt demonstrating poor motor planning and impaired muscle activation by recruiting quadriceps when attempting to  perform knee flexion - with max multimodal cuing pt able to initiate hip/knee flexion 3 times - pt continuing to say thumbs down during this exercise as he has increased difficulty with it. Pt then refusing further exercises by continuing to respond with thumbs down despite encouragement as he reports increased fatigue. Pt left supine in bed with needs in reach and bed alarm on. Patient missed 10 minutes of skilled physical therapy.  Therapy Documentation Precautions:  Precautions Precautions: Fall Precaution Comments: right hemiparesis Restrictions Weight Bearing Restrictions: No  Pain: Session 1: Continues to report R hand pain located at IV site - RN made aware.  Session 2: Pt continues to report R hand pain located at IV site - pt educated that therapist notified RN.   Therapy/Group: Individual Therapy  Tawana Scale, PT, DPT 07/31/2019, 12:55 PM

## 2019-07-31 NOTE — Progress Notes (Signed)
Occupational Therapy Weekly Progress Note  Patient Details  Name: Zachary Mccann MRN: [id number] Date of Birth: 08-Dec-1971  Beginning of progress report period: July 25, 2019 End of progress report period: July 31, 2019  Today's Date: 07/31/2019 OT Individual Time: 1005-1059 OT Individual Time Calculation (min): 54 min    Patient has met 0 of 4 short term goals.  Zachary Mccann has made slow progress with OT over the past week.  He continues to need mod assist for bathing sit to stand as well as mod to max for UB and LB dressing tasks.  RUE functional use has decreased as well with max hand over hand assist currently needed for LUE during bathing tasks or to hold items as a gross assist to open during bathing or grooming.  Noted pt with no changes on follow-up CT however UTI was noted and may have contributed to his lack of progress this week.  He continues to need mod assist for toilet and walk-in shower transfers with use of the RW and hand splint on the right.  Right inattention is still present with pt requiring mod instructional cueing to pay closer attention to the right arm for washing and rinsing it off completely as he is not thorough with this.  He still exhibits severe expressive difficulties as well.  Feel he will continue to require CIR level therapies in order to reach supervision to min assist level which are what his LTGs are currently set at.    Patient continues to demonstrate the following deficits: muscle weakness, impaired timing and sequencing, abnormal tone, unbalanced muscle activation, motor apraxia, decreased coordination and decreased motor planning, decreased attention to right and decreased motor planning, decreased attention, decreased awareness and delayed processing and decreased sitting balance, decreased standing balance, decreased postural control, hemiplegia and decreased balance strategies and therefore will continue to benefit from skilled OT intervention to  enhance overall performance with BADL.  Patient progressing toward long term goals..  Continue plan of care.  OT Short Term Goals Week 3:  OT Short Term Goal 1 (Week 3): Pt will complete bathing sit to stand with min assist. OT Short Term Goal 2 (Week 3): Pt will donn pullover shirt with min assist and mod demonstrational cueing. OT Short Term Goal 3 (Week 3): Pt will complete toileting with mod assist sit to stand. OT Short Term Goal 4 (Week 3): Pt will use the RUE as a gross assist with min facilitation during grooming and bathing task to hold items to be opened.  Skilled Therapeutic Interventions/Progress Updates:    Pt completed bathing and dressing during session.  Mod assist for supine to sit EOB with use of the Stedy and mod assist for sit to stand to transport to the shower secondary to time.  He was able to complete UB bathing with min assist and mod demonstrational cueing to rinse off the RUE completely.  Mod assist for LB bathing sit to stand with use of the LH sponge for washing his lower legs and feet.  He was able to transfer out to the wheelchair with mod assist for dressing stand pivot.  Mod demonstrational cueing with mod assist to donn pullover shirt following hemi dressing techniques.  He was able to complete LB dressing at max assist for donning brief and pants including pulling them over his hips.  He continues to struggle with donning them over the RLE without therapist providing max assist for crossing and maintaining the RLE over the left knee so  he can thread items.  Max assist for donning gripper socks as well using a step stool to assist with reaching the LLE.  Finished session with pt in the wheelchair and call button and phone in reach and safety alarm belt in place.    Therapy Documentation Precautions:  Precautions Precautions: Fall Precaution Comments: right hemiparesis Restrictions Weight Bearing Restrictions: No   Pain: Pain Assessment Pain Scale: Faces Pain  Score: 0-No pain Faces Pain Scale: Hurts a little bit Pain Location: Generalized Pain Descriptors / Indicators: Discomfort Pain Intervention(s): Repositioned;Other (Comment)(Patient Refused Pain medication at this time. ) ADL: See Care Tool Section for some details of ADL  Therapy/Group: Individual Therapy  Davielle Lingelbach OTR/L 07/31/2019, 12:47 PM

## 2019-07-31 NOTE — Progress Notes (Signed)
[place] PHYSICAL MEDICINE & REHABILITATION PROGRESS NOTE  Subjective/Complaints:  Pt seen at the bedside on rounds this AM.  Patient continues to communicate with thumbs up and thumbs down. Follows commands appropriately. Does note that he has right hand pain. It is difficult to characterize the pain, however the patient notes that it hurts in his hand and fingers only, not in the upper arm. He does not want any medication at this time.   ROS: limited due to language/communication   Objective: Vital Signs: Blood pressure 139/89, pulse 91, temperature 98.6 F (37 C), temperature source Oral, resp. rate 17, height 6' (1.829 m), weight (!) 148.2 kg, SpO2 100 %. No results found. Recent Labs    07/28/19 0918  WBC 10.8*  HGB 13.7  HCT 42.3  PLT 258   Recent Labs    07/31/19 0639  NA 139  K 4.5  CL 106  CO2 23  GLUCOSE 104*  BUN 22*  CREATININE 1.57*  CALCIUM 9.2    Physical Exam: BP 139/89 (BP Location: Left Arm)   Pulse 91   Temp 98.6 F (37 C) (Oral)   Resp 17   Ht 6' (1.829 m)   Wt (!) 148.2 kg   SpO2 100%   BMI 44.31 kg/m  Constitutional: No distress . Vital signs reviewed. HEENT: EOMI, oral membranes moist Neck: supple Cardiovascular: RRR without murmur. No JVD    Respiratory: CTA Bilaterally without wheezes or rales. Normal effort    GI: BS +, non-tender, non-distended  Skin: Warm and dry. Intact. Psych: Flat affect Musc: No edema.  No tenderness. Neurological:Alert Right facial weakness Nonverbal. Motor: LUE/LLE: Grossly 4/5 Right upper extremity: Shoulder abduction 2/5, elbow flexion/extension 2-/5, handgrip 1/5. Apraxic, stable Right lower extremity: 2-/5 hip/knee ext synergy   Assessment/Plan: 1. Functional deficits secondary to left brain infarct with history of CVA which require 3+ hours per day of interdisciplinary therapy in a comprehensive inpatient rehab setting.  Physiatrist is providing close team supervision and 24 hour management of  active medical problems listed below.  Physiatrist and rehab team continue to assess barriers to discharge/monitor patient progress toward functional and medical goals  Care Tool:  Bathing    Body parts bathed by patient: Chest, Right arm, Abdomen, Front perineal area, Right upper leg, Left upper leg, Right lower leg, Left lower leg, Face   Body parts bathed by helper: Buttocks, Left arm     Bathing assist Assist Level: Moderate Assistance - Patient 50 - 74%     Upper Body Dressing/Undressing Upper body dressing   What is the patient wearing?: Pull over shirt    Upper body assist Assist Level: Maximal Assistance - Patient 25 - 49%    Lower Body Dressing/Undressing Lower body dressing      What is the patient wearing?: Pants     Lower body assist Assist for lower body dressing: Maximal Assistance - Patient 25 - 49%     Toileting Toileting    Toileting assist Assist for toileting: Maximal Assistance - Patient 25 - 49%     Transfers Chair/bed transfer  Transfers assist  Chair/bed transfer activity did not occur: Safety/medical concerns(Decreased strength/balance)  Chair/bed transfer assist level: Moderate Assistance - Patient 50 - 74%     Locomotion Ambulation   Ambulation assist   Ambulation activity did not occur: Safety/medical concerns(decreased strength/balance/endurance)  Assist level: 2 helpers(mod assist with +2 w/c follow) Assistive device: Walker-rolling(and R UE orthotic) Max distance: 4755ft   Walk 10 feet activity  Assist  Walk 10 feet activity did not occur: Safety/medical concerns(decreased strength/balance/endurance)  Assist level: 2 helpers(mod assist with +2 w/c follow) Assistive device: Walker-rolling, Orthosis   Walk 50 feet activity   Assist Walk 50 feet with 2 turns activity did not occur: Safety/medical concerns  Assist level: 2 helpers(mod assist with +2 w/c follow) Assistive device: Walker-rolling, Orthosis    Walk 150  feet activity   Assist Walk 150 feet activity did not occur: Safety/medical concerns         Walk 10 feet on uneven surface  activity   Assist Walk 10 feet on uneven surfaces activity did not occur: Safety/medical concerns         Wheelchair     Assist   Type of Wheelchair: Manual Wheelchair activity did not occur: Safety/medical concerns(decreased strength/balance/endurance)  Wheelchair assist level: Maximal Assistance - Patient 25 - 49% Max wheelchair distance: 53ft    Wheelchair 50 feet with 2 turns activity    Assist    Wheelchair 50 feet with 2 turns activity did not occur: Safety/medical concerns(decreased strength/balance/endurance)   Assist Level: Maximal Assistance - Patient 25 - 49%   Wheelchair 150 feet activity     Wheelchair 150 feet activity did not occur: Safety/medical concerns(decreased strength/balance/endurance)       Medical Problem List and Plan: 1.Right side weakness, aphasia, dysphasiasecondary to left MCA and ACA scattered infarct due to left ICA occlusion as well as history of previous stroke in the past and medical noncompliance  Continue CIR, PT, OT SLP   PRAFO/WHO for RLE and RUE 2. Antithrombotics: -DVT/anticoagulation:SCDs -antiplatelet therapy: plavix 75mg daily 3. Pain Management:Tylenol PRN, will consider alternatives if hand pain persists 4. Mood:Provide emotional support -antipsychotic agents: N/A 5. Neuropsych: This patientappears to be capable of making decisions on hisown behalf. 6. Skin/Wound Care:Routine skin checks 7. Fluids/Electrolytes/Nutrition: check bmet tomorrow 8. Post stroke dysphagia.   Advanced to D1 nectars 8/7  Aspiration precautions  Pt doesn't want NGT  -intake seems to be improving, ate 50, 100, 100% yesteday  Advance diet as tolerated  -continue IVF 9. History of CAD with myocardial infarction status post stenting. Continue Plavix. Patient is followed  by Dr. Carlyle Dolly 10. HTN - Coreg 3.125 mg BID  Hydralazine 10 3 times daily started on 7/31, increased to 25 3 times daily on 8/3  Norvasc 2.5 started on 8/1,  increased to 10 on 8/4   Vitals:   07/30/19 1927 07/31/19 0531  BP: (!) 141/91 139/89  Pulse: 90 91  Resp: 19 17  Temp: 98.6 F (37 C) 98.6 F (37 C)  SpO2: 98% 100%  labile will monitor , increase coreg due to tachy with elevated diastolic    No changes indicated today 11. Hyperlipidemia. Continue Lipitor 12. History of tobacco as well as marijuana use. Continue counseling on cessation  13. Systolic CHF  Echo with EF of 40%  Chest x-ray personally reviewed, suggestive of cardiomegaly, no pulmonary infiltrates   Filed Weights   07/29/19 0534 07/29/19 1900 07/31/19 0531  Weight: (!) 147.5 kg (!) 148.4 kg (!) 148.2 kg   Lasix 20 daily DC'd on 8/7 due to limited p.o. intake, will consider restarting if appropriate  Stable on 8/12 14.  CKD stage III  Creatinine 1.8 on 8/6,Improved to 1.66 IVF nightly started on 8/6 cont  Continue to monitor 15.  Morbid obesity: Encouraged weight loss 16. Sleep disturbance  Melatonin started on 8/4, increased on 8/6 DC'd on 8/7  Remeron 7.5 started on 8/7  LOS: 14 days A FACE TO FACE EVALUATION WAS PERFORMED  Patient seen with Dr. Gerhard Munch, MD Internal Medicine, PGY1 07/31/2019,8:59 AM

## 2019-07-31 NOTE — Progress Notes (Addendum)
Speech Language Pathology Daily Session Note  Patient Details  Name: SHIQUAN MATHIEU MRN: [id number] Date of Birth: 12-19-1970  Today's Date: 07/31/2019 SLP Individual Time: 8676-1950; 9326-7124 SLP Individual Time Calculation (min): 13 min; 32 min  Short Term Goals: Week 2: SLP Short Term Goal 1 (Week 2): Pt will consume dys 1 textures and nectar thick liquids with min cues for use of swallowing precautions and minimal overt s/s of aspiration. SLP Short Term Goal 2 (Week 2): Patient will consume trials of thin liquids with minimal overt s/sx of aspiration and min cues for use of swallowing precautions over 3 consecutive sessions prior to repeat instrumental assessment. SLP Short Term Goal 3 (Week 2): Pt will produce vocalizations in coordination with movement of articulators in 50% of opportunities with max assist multimodal cues. SLP Short Term Goal 4 (Week 2): Pt will produce non speech oral motor movements on command as a precursor to speech tasks  in >50% of opportunities with max assist multimodal cues.  Skilled Therapeutic Interventions:  Skilled treatment session focused on dysphagia goals. SLP facilitated session by providing skilled observation of pt consuming nectar thick liquids via cup. SLP planned to assess thin liquids but pt had nectar thick liquids in front of him on bedside table. Pt consumed without overt s/s of aspiration giving SLP thumbs up when general cues provided for small sips. SLP informed pt that she would be back for lunch observation. Pt indicated understanding with thumbs up and head nod.   Skilled treatment session focused on skilled observation of pt consuming lunch tray of dysphagia 1 with nectar thick liquids. Pt with labial and lingual movements and pt also demonstrated decreased oral phase with functional food items than previous dates trial of ice chips/ Although pt was moving his lips and tongue (able to put lips together to get puree bolus from spoon) he was  not able to effectively contain boluses orally which resulted in moderate anterior spillage more on right than left. Pt with cough x 1 at end of meal while consuming consecutive sips of nectar thick liquids. Despite Max A multimodal cues, pt with no vocalizations but utilized thumbs up/down to indicate yes/no. Pt left upright in wheelchair, lap belt alarm on and all needs within reach. Continue per current plan of care.      Pain Pain Assessment Pain Scale: Faces Pain Score: 0-No pain  Therapy/Group: Individual Therapy  Vaidehi Braddy 07/31/2019, 1618 PM

## 2019-07-31 NOTE — Plan of Care (Signed)
  Problem: Consults Goal: RH STROKE PATIENT EDUCATION Description: See Patient Education module for education specifics  Outcome: Progressing   Problem: RH BLADDER ELIMINATION Goal: RH STG MANAGE BLADDER WITH ASSISTANCE Description: STG Manage Bladder With Min Assistance Outcome: Progressing   Problem: RH COGNITION-NURSING Goal: RH STG ANTICIPATES NEEDS/CALLS FOR ASSIST W/ASSIST/CUES Description: STG Anticipates Needs/Calls for Assist With supervision Assistance/Cues. Outcome: Progressing   Problem: RH KNOWLEDGE DEFICIT Goal: RH STG INCREASE KNOWLEDGE OF STROKE PROPHYLAXIS Description: Pt will demonstrate increased knowledge of stroke prevention including diet management, medication regimen, and follow up care with primary care physician at the time of discharge with min assist/cues.  Outcome: Progressing

## 2019-08-01 LAB — GLUCOSE, CAPILLARY: Glucose-Capillary: 123 mg/dL — ABNORMAL HIGH (ref 70–99)

## 2019-08-02 ENCOUNTER — Inpatient Hospital Stay (HOSPITAL_COMMUNITY): Payer: Self-pay

## 2019-08-02 NOTE — Progress Notes (Signed)
Orthopedic Tech Progress Note Patient Details:  Zachary Mccann 1971-10-15 [id number] Called several times to place order.. no one is answering the phone. Will keep trying  Patient ID: Zachary Mccann, male   DOB: April 15, 1971, 48 y.o.   MRN: [id number]   [name] 08/02/2019, 1:29 PM

## 2019-08-02 NOTE — Progress Notes (Signed)
[facility] PHYSICAL MEDICINE & REHABILITATION PROGRESS NOTE  Subjective/Complaints:  No issues overnight except patient indicates he has some right ankle pain.  He indicates by pointing and we narrowed it down with yes no responses  ROS: limited due to language/communication   Objective: Vital Signs: Blood pressure 130/80, pulse 82, temperature 98.4 F (36.9 C), resp. rate 16, height 6' (1.829 m), weight (!) 147.4 kg, SpO2 100 %. No results found. No results for input(s): WBC, HGB, HCT, PLT in the last 72 hours. Recent Labs    07/31/19 0639  NA 139  K 4.5  CL 106  CO2 23  GLUCOSE 104*  BUN 22*  CREATININE 1.57*  CALCIUM 9.2    Physical Exam: BP 130/80 (BP Location: Left Arm)   Pulse 82   Temp 98.4 F (36.9 C)   Resp 16   Ht 6' (1.829 m)   Wt (!) 147.4 kg   SpO2 100%   BMI 44.07 kg/m  Constitutional: No distress . Vital signs reviewed. HEENT: EOMI, oral membranes moist Neck: supple Cardiovascular: RRR without murmur. No JVD    Respiratory: CTA Bilaterally without wheezes or rales. Normal effort    GI: BS +, non-tender, non-distended  Skin: Warm and dry.  Intact. Psych: Appears to be flat Musc: There is mild pain with passive ankle dorsiflexor patient also has clonus at the ankle.  There is no pain with ankle inversion eversion no evidence of ankle swelling or erythema.  No foot swelling or erythema. Neurological:Alert Right facial weakness Nonverbal. Motor: LUE/LLE: Grossly 4/5 Right upper extremity: Shoulder abduction 2/5, elbow flexion/extension 2-/5, handgrip 1/5. Apraxic, stable Right lower extremity: 2-/5 hip/knee ext synergy  Assessment/Plan: 1. Functional deficits secondary to left brain infarct with history of CVA which require 3+ hours per day of interdisciplinary therapy in a comprehensive inpatient rehab setting.  Physiatrist is providing close team supervision and 24 hour management of active medical problems listed below.  Physiatrist and rehab  team continue to assess barriers to discharge/monitor patient progress toward functional and medical goals  Care Tool:  Bathing    Body parts bathed by patient: Chest, Right arm, Abdomen, Front perineal area, Right upper leg, Left upper leg, Right lower leg, Left lower leg, Face, Buttocks   Body parts bathed by helper: Left arm     Bathing assist Assist Level: Moderate Assistance - Patient 50 - 74%     Upper Body Dressing/Undressing Upper body dressing   What is the patient wearing?: Pull over shirt    Upper body assist Assist Level: Moderate Assistance - Patient 50 - 74%    Lower Body Dressing/Undressing Lower body dressing      What is the patient wearing?: Incontinence brief, Pants     Lower body assist Assist for lower body dressing: Maximal Assistance - Patient 25 - 49%     Toileting Toileting    Toileting assist Assist for toileting: Maximal Assistance - Patient 25 - 49%     Transfers Chair/bed transfer  Transfers assist  Chair/bed transfer activity did not occur: Safety/medical concerns(Decreased strength/balance)  Chair/bed transfer assist level: Moderate Assistance - Patient 50 - 74%     Locomotion Ambulation   Ambulation assist   Ambulation activity did not occur: Safety/medical concerns(decreased strength/balance/endurance)  Assist level: Moderate Assistance - Patient 50 - 74% Assistive device: Walker-rolling Max distance: 6472ft   Walk 10 feet activity   Assist  Walk 10 feet activity did not occur: Safety/medical concerns(decreased strength/balance/endurance)  Assist level: Moderate Assistance -  Patient - 50 - 74% Assistive device: Walker-rolling   Walk 50 feet activity   Assist Walk 50 feet with 2 turns activity did not occur: Safety/medical concerns  Assist level: Moderate Assistance - Patient - 50 - 74% Assistive device: Walker-rolling    Walk 150 feet activity   Assist Walk 150 feet activity did not occur: Safety/medical  concerns         Walk 10 feet on uneven surface  activity   Assist Walk 10 feet on uneven surfaces activity did not occur: Safety/medical concerns         Wheelchair     Assist   Type of Wheelchair: Manual Wheelchair activity did not occur: Safety/medical concerns(decreased strength/balance/endurance)  Wheelchair assist level: Maximal Assistance - Patient 25 - 49% Max wheelchair distance: 37ft    Wheelchair 50 feet with 2 turns activity    Assist    Wheelchair 50 feet with 2 turns activity did not occur: Safety/medical concerns(decreased strength/balance/endurance)   Assist Level: Maximal Assistance - Patient 25 - 49%   Wheelchair 150 feet activity     Assist Wheelchair 150 feet activity did not occur: Safety/medical concerns(decreased strength/balance/endurance)          Medical Problem List and Plan: 1.Right side weakness, aphasia, dysphasiasecondary to left MCA and ACA scattered infarct due to left ICA occlusion as well as history of previous stroke in the past and medical noncompliance  Continue CIR, PT, OT SLP, slow progress likely due to UTI  PRAFO/WHO for RLE and RUE 2. Antithrombotics: -DVT/anticoagulation:SCDs -antiplatelet therapy: plavix 75mg daily 3. Pain Management:Tylenol as needed 4. Mood:Provide emotional support -antipsychotic agents: N/A 5. Neuropsych: This patientappears to be capable of making decisions on hisown behalf. 6. Skin/Wound Care:Routine skin checks 7. Fluids/Electrolytes/Nutrition: check bmet tomorrow 8. Post stroke dysphagia.   Advanced to D1 nectars 8/7, poor oral intake of fluids 590 mL on 8/15  Aspiration precautions  Pt doesn't want NGT  -intake fair 25-70% meals   Advance diet as tolerated  -continue IVF 9. History of CAD with myocardial infarction status post stenting. Continue Plavix. Patient is followed by Dr. Carlyle Dolly 10. Hypertension. Coreg 3.125 mg twice  daily.   Hydralazine 10 3 times daily started on 7/31, increased to 25 3 times daily on 8/3  Norvasc 10 mg daily   Vitals:   08/01/19 2143 08/02/19 0424  BP: 127/69 130/80  Pulse:  82  Resp:  16  Temp:  98.4 F (36.9 C)  SpO2:  100%  Controlled 8/16    No changes indicated today 11. Hyperlipidemia. Lipitor 12. History of tobacco as well as marijuana use. Counseling 13. Systolic CHF  Echo with EF of 40%  Chest x-ray personally reviewed, suggestive of cardiomegaly  Filed Weights   07/31/19 0531 08/01/19 0415 08/02/19 0424  Weight: (!) 148.2 kg (!) 149.4 kg (!) 147.4 kg   Lasix 20 daily DC'd on 8/7 due to limited p.o. intake, will consider restarting if appropriate  8/16 a bit up, monitor trend,  reduced IVF to 600 cc overnight 14.  CKD stage III  Creatinine 1.8 on 8/6,Improved to 1.66 IVF nightly started on 8/6 cont, reduce to 85ml/hr recheck BMET in am, last BUN improved to 22  Continue to monitor 15.  Morbid obesity: Encouraged weight loss 16. Sleep disturbance  Melatonin started on 8/4, increased on 8/6 DC'd on 8/7  Remeron 7.5 started on 8/7 17.  UTI not S to keflex will d/c and start macrobid 18.  Right ankle pain likely  developing some contracture does have increased tone in the plantar flexors will order PR AFO LOS: 16 days A FACE TO FACE EVALUATION WAS PERFORMED  Erick Colace 08/02/2019, 1:11 PM

## 2019-08-02 NOTE — Progress Notes (Signed)
Physical Therapy Session Note  Patient Details  Name: Zachary Mccann MRN: [id number] Date of Birth: Mar 27, 1971  Today's Date: 08/02/2019 PT Individual Time: 1000-1058 PT Individual Time Calculation (min): 58 min   Short Term Goals: Week 2:  PT Short Term Goal 1 (Week 2): Pt will perform sit<>stand with min assist >75% of the time PT Short Term Goal 2 (Week 2): Pt will ambulate x 50 ft with LRAD and mod assist PT Short Term Goal 3 (Week 2): Pt will perform stand pivot transfers with mod assist and LRAD  Skilled Therapeutic Interventions/Progress Updates:    Pt supine in bed upon PT arrival, agreeable to therapy tx and reports pain in R ankle. Pt supine in bed while therapist looped LEs through pants and donned socks for time management. Pt transferred to sitting with max assist, cues for techniques and increased time given for motor planning. Pt seated EOB with supervision for balance while donning shirt, cues for attention to R UE, assist to loop R UE through shirt. Pt performed sit<>stand from elevated bed with RW and min assist to pull pants over hips, pt pulled up L side while therapist helped with R side. Stand pivot this session from bed>w/c with RW and min assist, pt demonstrates poor safety with transfer as he does not bring R LE back and demonstrates uncontrolled decent into w/c. When therapist explained that pt needed to bring R LE back before sitting pt beings getting frustrated, pushing RW away and pointing to his R ankle. Pt transported to the gym. When asked if his ankle just started hurting today he gave thumbs down, when asked if his ankle has been hurting the whole time he has been here he gave thumbs up. RN notified that pt is requesting pain medicine, RN reported she can given tylenol. Once in the gym therapist asked pt if he could stand and transfer to therapy mat, pt gave thumbs down and pointed to ankle. Pt declining further standing activities for today. Pt seated in w/c worked  on R LE isolated muscle activation- performed 2 x 10 R LE LAQ, performed x 10 R LE active assisted hip flexion, x 10 ankle DF, and x 10 heel slides for hamstring strength, all with cues for techniques. Pt transported back to room and left in w/c with needs in reach and chair alarm set.   Therapy Documentation Precautions:  Precautions Precautions: Fall Precaution Comments: right hemiparesis Restrictions Weight Bearing Restrictions: No    Therapy/Group: Individual Therapy  Netta Corrigan, PT, DPT 08/02/2019, 7:55 AM

## 2019-08-03 ENCOUNTER — Inpatient Hospital Stay (HOSPITAL_COMMUNITY): Payer: Self-pay | Admitting: Speech Pathology

## 2019-08-03 ENCOUNTER — Inpatient Hospital Stay (HOSPITAL_COMMUNITY): Payer: Self-pay | Admitting: Occupational Therapy

## 2019-08-03 ENCOUNTER — Inpatient Hospital Stay (HOSPITAL_COMMUNITY): Payer: Self-pay

## 2019-08-03 NOTE — Progress Notes (Signed)
Occupational Therapy Session Note  Patient Details  Name: Zachary Mccann MRN: [id number] Date of Birth: 1971/03/08  Today's Date: 08/03/2019 OT Individual Time: 0800-0900 OT Individual Time Calculation (min): 60 min    Short Term Goals: Week 3:  OT Short Term Goal 1 (Week 3): Pt will complete bathing sit to stand with min assist. OT Short Term Goal 2 (Week 3): Pt will donn pullover shirt with min assist and mod demonstrational cueing. OT Short Term Goal 3 (Week 3): Pt will complete toileting with mod assist sit to stand. OT Short Term Goal 4 (Week 3): Pt will use the RUE as a gross assist with min facilitation during grooming and bathing task to hold items to be opened.  Skilled Therapeutic Interventions/Progress Updates:    Pt completed shower and dressing during session.  He was able to transfer supine to sit with mod assist.  He was able to complete transfer to the tub bench with total assist using the Stedy secondary to decreased time.  He was able to complete bathing sit to stand with mod assist and mod instructional cueing for attention to the right side.  He needed max hand over hand for assistance to wash the LUE with the RUE.  He completed short distance mobility with mod assist for transfer from the shower bench to the toilet.  He was able to completed a bowel movement but needed max assist for toilet hygiene and clothing management and toilet hygiene.  He completed transfer to the wheelchair via Stedy again secondary to time.  Therapist helped with dressing secondary to decreased time.  Pt left in the wheelchair with call button and phone in reach and safety belt in place.    Therapy Documentation Precautions:  Precautions Precautions: Fall Precaution Comments: right hemiparesis Restrictions Weight Bearing Restrictions: No  Pain: Pain Assessment Pain Scale: 0-10 Pain Score: 0-No pain Faces Pain Scale: No hurt ADL: See Care Tool Section for some details of  ADLs  Therapy/Group: Individual Therapy  Ellon Marasco OTR/L 08/03/2019, 12:10 PM

## 2019-08-03 NOTE — Plan of Care (Signed)
  Problem: Consults Goal: RH STROKE PATIENT EDUCATION Description: See Patient Education module for education specifics  Outcome: Progressing   Problem: RH BOWEL ELIMINATION Goal: RH STG MANAGE BOWEL WITH ASSISTANCE Description: STG Manage Bowel with [place]. Outcome: Progressing Flowsheets (Taken 08/03/2019 1244) STG: Pt will manage bowels with assistance: 2-Maximum assistance   Problem: RH BLADDER ELIMINATION Goal: RH STG MANAGE BLADDER WITH ASSISTANCE Description: STG Manage Bladder With Min Assistance Outcome: Progressing Flowsheets (Taken 08/03/2019 1244) STG: Pt will manage bladder with assistance: 2-Maximum assistance   Problem: RH COGNITION-NURSING Goal: RH STG ANTICIPATES NEEDS/CALLS FOR ASSIST W/ASSIST/CUES Description: STG Anticipates Needs/Calls for Assist With supervision Assistance/Cues. Outcome: Progressing Flowsheets (Taken 08/03/2019 1244) STG: Anticipates needs/calls for assistance with assistance/cues: 3-Moderate assistance   Problem: RH KNOWLEDGE DEFICIT Goal: RH STG INCREASE KNOWLEDGE OF STROKE PROPHYLAXIS Description: Pt will demonstrate increased knowledge of stroke prevention including diet management, medication regimen, and follow up care with primary care physician at the time of discharge with min assist/cues.  Outcome: Progressing

## 2019-08-03 NOTE — Progress Notes (Signed)
Orthopedic Tech Progress Note Patient Details:  Zachary Mccann [birth year] [id number] Called in order to [facility]. Let them know I called several times yesterday and was able to reach anyone asked for order to be STAT. Patient ID: Zachary Mccann, male   DOB: 1971/02/11, 48 y.o.   MRN: [id number]   [name] 08/03/2019, 8:28 AM

## 2019-08-03 NOTE — Progress Notes (Addendum)
Speech Language Pathology Weekly Progress and Session Note  Patient Details  Name: Zachary Mccann MRN: [id number] Date of Birth: Oct 25, 1971  Beginning of progress report period: July 27, 2019 End of progress report period: August 03, 2019  Today's Date: 08/03/2019 SLP Individual Time: 1100-1155 SLP Individual Time Calculation (min): 55 min  Short Term Goals: Week 2: SLP Short Term Goal 1 (Week 2): Pt will consume dys 1 textures and nectar thick liquids with min cues for use of swallowing precautions and minimal overt s/s of aspiration. SLP Short Term Goal 1 - Progress (Week 2): Progressing toward goal SLP Short Term Goal 2 (Week 2): Patient will consume trials of thin liquids with minimal overt s/sx of aspiration and min cues for use of swallowing precautions over 3 consecutive sessions prior to repeat instrumental assessment. SLP Short Term Goal 2 - Progress (Week 2): Progressing toward goal SLP Short Term Goal 3 (Week 2): Pt will produce vocalizations in coordination with movement of articulators in 50% of opportunities with max assist multimodal cues. SLP Short Term Goal 3 - Progress (Week 2): Progressing toward goal SLP Short Term Goal 4 (Week 2): Pt will produce non speech oral motor movements on command as a precursor to speech tasks  in >50% of opportunities with max assist multimodal cues. SLP Short Term Goal 4 - Progress (Week 2): Met    New Short Term Goals: Week 3: SLP Short Term Goal 1 (Week 3): Pt will consume dys 1 textures and nectar thick liquids with min cues for use of swallowing precautions and minimal overt s/s of aspiration. SLP Short Term Goal 2 (Week 3): Patient will consume trials of thin liquids with minimal overt s/sx of aspiration and min cues for use of swallowing precautions over 3 consecutive sessions prior to repeat instrumental assessment. SLP Short Term Goal 3 (Week 3): Pt will produce vocalizations in coordination with movement of articulators in 50% of  opportunities with max assist multimodal cues. SLP Short Term Goal 4 (Week 3): Pt will produce non speech oral motor movements on command as a precursor to speech tasks  in >50% of opportunities with mod assist multimodal cues.  Weekly Progress Updates: Pt has made some functional gains  and met 1 out of 4 short term goals this reporting period. He is currently Mod assist for dysphagia and Max assist for verbal expression tasks. Pt is consuming dysphagia 1 (puree) diet with nectar thick liquids. Pt has demonstrated improved ability to imitate non-speech oral motor movements as a precursor to speech tasks as well as timely swallow initiation during trials of ice chips and thin liquids. Pt and family education is ongoing. Anticipate pt will need 24/7 supervision upon discharge. Pt would continue to benefit from skilled ST services while inpatient due to severe dysphagia, apraxia, and aphasia.     Intensity: Minumum of 1-2 x/day, 30 to 90 minutes Frequency: 3 to 5 out of 7 days Duration/Length of Stay: 16-20 days Treatment/Interventions: Speech/Language facilitation;Therapeutic Exercise;Patient/family education;Dysphagia/aspiration precaution training;Multimodal communication approach;Therapeutic Activities;Cueing hierarchy;Functional tasks   Daily Session  Skilled Therapeutic Interventions: Pt was seen for skilled ST targeting dysphagia and language goals. SLP facilitated session with Mod verbal cues to utilize slow rate during pt's consumption of nectar thick liquids. Pt demonstrated immediate cough in 1 out of ~20 trials of nectar. Pt demonstrated efficient mastication and initiated swallow sequence with Mod verbal cues in 4 out of 4 trials of ice chips; cough X1 noted during ice chip trials. Recommend continue ice chip  and thin liquid trials prior to repeat MBS. Pt imitated 75% of oral motor movements (open and close mouth, pucker lips) with Max visual and verbal cues from SLP. Pt unable to imitate  words, however he spontaneously approximated "wife" during conversational tasks with SLP. Pt continues to require use of notebook as primary means of communication. Pt expressed desire to return home and frustration with "losing his voice." SLP reviewed CIR goals with pt and emphasized importance of continued participation in therapies to maximize functional independence. Pt was left in wheelchair with chair alarm set, call bell within reach, and all needs met. Continue per current plan of care.         Pain Pain Assessment Pain Scale: 0-10 Pain Score: 0-No pain  Therapy/Group: Individual Therapy  Arbutus Leas 08/03/2019, 12:27 PM

## 2019-08-03 NOTE — Progress Notes (Signed)
Physical Therapy Session Note  Patient Details  Name: Zachary Mccann MRN: [id number] Date of Birth: 06-11-1971  Today's Date: 08/03/2019 PT Individual Time: 1445-1555 PT Individual Time Calculation (min): 70 min   Short Term Goals: Week 2:  PT Short Term Goal 1 (Week 2): Pt will perform sit<>stand with min assist >75% of the time PT Short Term Goal 2 (Week 2): Pt will ambulate x 50 ft with LRAD and mod assist PT Short Term Goal 3 (Week 2): Pt will perform stand pivot transfers with mod assist and LRAD  Skilled Therapeutic Interventions/Progress Updates:    Pt supine in bed upon PT arrival, agreeable to therapy tx and reports ankle pain (points to ankle and gives thumbs up), willing to work on standing/ambulation today. Pt transferred to sitting EOB with mod assist and performed stand pivot to w/c with RW and min assist, unable to step back with R LE when turning to sit. Pt transported to the gym in w/c. Pt ambulated x 20 ft and x 35 ft this session with RW and min assist, therapist bringing w/c behind pt for safety, cues for increased R step length and attention to R LE. Attempted to work on R LE swing phase to perform cone kicking task while standing, UE support on RW, pt able to kick R LE forward but unable to step back with R LE to get back to starting position despite cues/assist. Pt then pointed to R ankle for pain and declined further standing activities. Pt worked on seated R LE strengthening exercises, 2 x 10 of each with cues for techniques: LAQ, heel slides with pillowcase under foot, and active assisted hip flexion. Pt agreeable to try more standing. Pt performed sit<>stand with min assist, worked on R LE foot clearance to perform toe taps on 2 inch step, during this activity pt with episode of incontinence of bladder. Pt transported back to room. Sit<>stands x 2 from w/c with RW and min assist while therapist assisted to doff soiled clothes, and then pt given wash clothe for peri care,  therapist assisted to wash LEs. Pt performed stand pivot to bed with RW and min assist. Pt doffed shirt and donned gown with assist. Sit>supine with min assist for R LE management. Pt performed rolling in both direction with min assist in order to don brief. Pt left supine in bed with needs in reach, educated pt to use urinal and left in reach, bed alarm set.   Therapy Documentation Precautions:  Precautions Precautions: Fall Precaution Comments: right hemiparesis Restrictions Weight Bearing Restrictions: No   Therapy/Group: Individual Therapy  Netta Corrigan, PT, DPT 08/03/2019, 7:59 AM

## 2019-08-03 NOTE — Progress Notes (Signed)
[place] PHYSICAL MEDICINE & REHABILITATION PROGRESS NOTE  Subjective/Complaints:  No issues per nursing, except  Keeps c/o R ankle pain- explained we had ordered AFO and he nodded understanding.  Per chart, AFO has been ordered from [facility]  ROS: limited due to language/communication   Objective: Vital Signs: Blood pressure (!) 143/80, pulse 80, temperature 98.1 F (36.7 C), resp. rate 12, height 6' (1.829 m), weight (!) 147.8 kg, SpO2 91 %. No results found. No results for input(s): WBC, HGB, HCT, PLT in the last 72 hours. No results for input(s): NA, K, CL, CO2, GLUCOSE, BUN, CREATININE, CALCIUM in the last 72 hours.  Physical Exam: BP (!) 143/80 (BP Location: Left Arm)   Pulse 80   Temp 98.1 F (36.7 C)   Resp 12   Ht 6' (1.829 m)   Wt (!) 147.8 kg   SpO2 91%   BMI 44.19 kg/m  Constitutional: No distress . Vital signs reviewed. Sitting up in manual w/c, in room , alone, flat affect, communicating with hand signals, NAD HEENT: EOMI, oral membranes moist Neck: supple Cardiovascular: RRR without murmur. No JVD    Respiratory: CTA Bilaterally without wheezes or rales. Normal effort    GI: BS +, non-tender, non-distended  Skin: Warm and dry.  Intact. Psych: Appears to be flat Musc: There is mild pain with passive ankle dorsiflexor patient also has clonus at the ankle.  There is no pain with ankle inversion eversion no evidence of ankle swelling or erythema.  No foot swelling or erythema. Neurological:Alert Right facial weakness Nonverbal. Motor: LUE/LLE: Grossly 4/5 Right upper extremity: Shoulder abduction 2/5, elbow flexion/extension 2-/5, handgrip 1/5. Apraxic, stable Right lower extremity: 2-/5 hip/knee ext synergy  Assessment/Plan: 1. Functional deficits secondary to left brain infarct with history of CVA which require 3+ hours per day of interdisciplinary therapy in a comprehensive inpatient rehab setting.  Physiatrist is providing close team supervision and 24  hour management of active medical problems listed below.  Physiatrist and rehab team continue to assess barriers to discharge/monitor patient progress toward functional and medical goals  Care Tool:  Bathing    Body parts bathed by patient: Chest, Right arm, Abdomen, Front perineal area, Right upper leg, Left upper leg, Right lower leg, Left lower leg, Face, Buttocks   Body parts bathed by helper: Left arm     Bathing assist Assist Level: Moderate Assistance - Patient 50 - 74%     Upper Body Dressing/Undressing Upper body dressing   What is the patient wearing?: Pull over shirt    Upper body assist Assist Level: Moderate Assistance - Patient 50 - 74%    Lower Body Dressing/Undressing Lower body dressing      What is the patient wearing?: Incontinence brief, Pants     Lower body assist Assist for lower body dressing: Maximal Assistance - Patient 25 - 49%     Toileting Toileting    Toileting assist Assist for toileting: Maximal Assistance - Patient 25 - 49%     Transfers Chair/bed transfer  Transfers assist  Chair/bed transfer activity did not occur: Safety/medical concerns(Decreased strength/balance)  Chair/bed transfer assist level: Moderate Assistance - Patient 50 - 74%     Locomotion Ambulation   Ambulation assist   Ambulation activity did not occur: Safety/medical concerns(decreased strength/balance/endurance)  Assist level: Moderate Assistance - Patient 50 - 74% Assistive device: Walker-rolling Max distance: 64ft   Walk 10 feet activity   Assist  Walk 10 feet activity did not occur: Safety/medical concerns(decreased strength/balance/endurance)  Assist level: Moderate Assistance - Patient - 50 - 74% Assistive device: Walker-rolling   Walk 50 feet activity   Assist Walk 50 feet with 2 turns activity did not occur: Safety/medical concerns  Assist level: Moderate Assistance - Patient - 50 - 74% Assistive device: Walker-rolling    Walk 150  feet activity   Assist Walk 150 feet activity did not occur: Safety/medical concerns         Walk 10 feet on uneven surface  activity   Assist Walk 10 feet on uneven surfaces activity did not occur: Safety/medical concerns         Wheelchair     Assist   Type of Wheelchair: Manual Wheelchair activity did not occur: Safety/medical concerns(decreased strength/balance/endurance)  Wheelchair assist level: Maximal Assistance - Patient 25 - 49% Max wheelchair distance: 55ft    Wheelchair 50 feet with 2 turns activity    Assist    Wheelchair 50 feet with 2 turns activity did not occur: Safety/medical concerns(decreased strength/balance/endurance)   Assist Level: Maximal Assistance - Patient 25 - 49%   Wheelchair 150 feet activity     Assist Wheelchair 150 feet activity did not occur: Safety/medical concerns(decreased strength/balance/endurance)       CBG (last 3)  Recent Labs    08/01/19 1146  GLUCAP 123*     Medical Problem List and Plan: 1.Right side weakness, aphasia, dysphasiasecondary to left MCA and ACA scattered infarct due to left ICA occlusion as well as history of previous stroke in the past and medical noncompliance  Continue CIR, PT, OT SLP, slow progress likely due to UTI  PRAFO/WHO for RLE and RUE 2. Antithrombotics: -DVT/anticoagulation:SCDs -antiplatelet therapy: plavix 75mg daily 3. Pain Management:Tylenol as needed 4. Mood:Provide emotional support -antipsychotic agents: N/A 5. Neuropsych: This patientappears to be capable of making decisions on hisown behalf. 6. Skin/Wound Care:Routine skin checks 7. Fluids/Electrolytes/Nutrition: check bmet tomorrow 8. Post stroke dysphagia.   Advanced to D1 nectars 8/7, poor oral intake of fluids 590 mL on 8/15  Aspiration precautions  Pt doesn't want NGT  -intake fair 25-70% meals   Advance diet as tolerated  -continue IVF 9. History of CAD with  myocardial infarction status post stenting. Continue Plavix. Patient is followed by Dr. Dina Rich 10. Hypertension. Coreg 3.125 mg twice daily.   Hydralazine 10 3 times daily started on 7/31, increased to 25 3 times daily on 8/3  Norvasc 10 mg daily   Vitals:   08/02/19 2004 08/03/19 0415  BP: 109/69 (!) 143/80  Pulse: 91 80  Resp: 16 12  Temp: 98.4 F (36.9 C) 98.1 F (36.7 C)  SpO2: 94% 91%  Controlled 8/16    No changes indicated today 11. Hyperlipidemia. Lipitor 12. History of tobacco as well as marijuana use. Counseling 13. Systolic CHF  Echo with EF of 23%  Chest x-ray personally reviewed, suggestive of cardiomegaly  Filed Weights   08/01/19 0415 08/02/19 0424 08/03/19 0500  Weight: (!) 149.4 kg (!) 147.4 kg (!) 147.8 kg   Lasix 20 daily DC'd on 8/7 due to limited p.o. intake, will consider restarting if appropriate  8/16 a bit up, monitor trend,  reduced IVF to 600 cc overnight 14.  CKD stage III  Creatinine 1.8 on 8/6,Improved to 1.66 IVF nightly started on 8/6 cont, reduce to 31ml/hr recheck BMET in am, last BUN improved to 22  Continue to monitor 15.  Morbid obesity: Encouraged weight loss 16. Sleep disturbance  Melatonin started on 8/4, increased on 8/6 DC'd on 8/7  Remeron 7.5 started on 8/7 17.  UTI not S to keflex will d/c and start macrobid 18.  Right ankle pain likely developing some contracture does have increased tone in the plantar flexors will order PR AFO  8/17- ordered today from [facility] LOS: 17 days A FACE TO FACE EVALUATION WAS PERFORMED  Eldo Umanzor 08/03/2019, 11:06 AM

## 2019-08-04 ENCOUNTER — Inpatient Hospital Stay (HOSPITAL_COMMUNITY): Payer: Self-pay

## 2019-08-04 ENCOUNTER — Inpatient Hospital Stay (HOSPITAL_COMMUNITY): Payer: Self-pay | Admitting: Speech Pathology

## 2019-08-04 ENCOUNTER — Inpatient Hospital Stay (HOSPITAL_COMMUNITY): Payer: Self-pay | Admitting: Occupational Therapy

## 2019-08-04 NOTE — Progress Notes (Signed)
Physical Therapy Session Note  Patient Details  Name: Zachary Mccann MRN: [id number] Date of Birth: 1971/01/02  Today's Date: 08/04/2019 PT Individual Time: 2876-8115 PT Individual Time Calculation (min): 72 min   Short Term Goals: Week 2:  PT Short Term Goal 1 (Week 2): Pt will perform sit<>stand with min assist >75% of the time PT Short Term Goal 2 (Week 2): Pt will ambulate x 50 ft with LRAD and mod assist PT Short Term Goal 3 (Week 2): Pt will perform stand pivot transfers with mod assist and LRAD  Skilled Therapeutic Interventions/Progress Updates:    Pt seated in w/c eating lunch upon PT arrival, agreeable to therapy tx and denies pain at rest. Pt finished eating meal with therapist providing cues and supervision as needed. Therapist asked pt if he needed to use bathroom prior to going to the gym, pt agreeable to try using urinal. Pt performed sit<>stand with RW and min assist, standing balance with RW and min assist while therapist assisted with clothing management and pt used urinal with L UE. Pt continent of bladder. Pt seated in w/c, transported to the gym. Pt ambulated 2 x 40 ft this session with RW and min assist, therapist also bringing w/c behind the pt, during ambulation therapist providing verbal cues for increased R step length and foot clearance. Pt worked on R LE strength, swing phase and dynamic balance while standing with RW to perform x 10 and x 5 toe taps on 1 inch step, and then to perform x 10 ball kicks, cues for techniques. Pt ambulated x 50 ft with RW and min assist, cues for increased R step length which is improved compared to last bout. Pt steps through with R LE about 50% of the time during gait. Pt worked on stand pivot transfers with RW this session, performed x 2 in each direction from w/c<>mat, cues for R UE/LE placement when pushing to stand and cues for device management/placement during the turn. Pt transported back to room at end of session, requesting to get in  bed. Stand pivot to bed with RW and min assist, sit>supine with min assist for R LE management. Pt left supine in bed with needs in reach and bed alarm set.   Therapy Documentation Precautions:  Precautions Precautions: Fall Precaution Comments: right hemiparesis Restrictions Weight Bearing Restrictions: No    Therapy/Group: Individual Therapy  Netta Corrigan, PT, DPT 08/04/2019, 10:50 AM

## 2019-08-04 NOTE — Progress Notes (Signed)
Occupational Therapy Session Note  Patient Details  Name: Zachary Mccann MRN: [id number] Date of Birth: November 12, 1971  Today's Date: 08/04/2019 OT Individual Time: 2841-3244 OT Individual Time Calculation (min): 75 min    Short Term Goals: Week 3:  OT Short Term Goal 1 (Week 3): Pt will complete bathing sit to stand with min assist. OT Short Term Goal 2 (Week 3): Pt will donn pullover shirt with min assist and mod demonstrational cueing. OT Short Term Goal 3 (Week 3): Pt will complete toileting with mod assist sit to stand. OT Short Term Goal 4 (Week 3): Pt will use the RUE as a gross assist with min facilitation during grooming and bathing task to hold items to be opened.  Skilled Therapeutic Interventions/Progress Updates:    Pt transferred from supine to sit EOB with mod assist to begin session.  He then completed sit to stand with mod assist in order to use the urinal with max assist from therapist to hold in place.  Pt was able to void 450cc however.  Next, had pt work on dressing with max demonstrational cueing for donning pullover shirt following hemi dressing techniques and min assist for actual completion of the task.  Max assist for donning brief, underpants, and shorts as well as socks, with mod assist for sit to stand when pulling items over his hips.  He then completed stand pivot transfer with mod assist to the wheelchair.  Next pt worked on oral hygiene sitting at the sink.  Min instructional cueing to sequence with mod assist for setup of toothbrush with toothpaste.  Pt was next rolled down to the ortho gym where he worked on Brewing technologist with use of the UE ergonometer for nine mins.  RUE was positioned on the hand grip and ace bandage was applied.  He was able to complete 3 mins using BUEs and then 2 more sets of 3 mins with isolated use of the RUE and level 1 resistance.  He needed mod assist to complete revolutions.  Finished session with return to the room with call  button and phone in reach and safety belt in place.    Therapy Documentation Precautions:  Precautions Precautions: Fall Precaution Comments: right hemiparesis Restrictions Weight Bearing Restrictions: No  Pain: Pain Assessment Pain Scale: Faces Pain Score: 0-No pain ADL: See Care Tool for some details of ADL  Therapy/Group: Individual Therapy  Demonta Wombles OTR/L 08/04/2019, 9:28 AM

## 2019-08-04 NOTE — Progress Notes (Signed)
Speech Language Pathology Daily Session Note  Patient Details  Name: Zachary Mccann MRN: [id number] Date of Birth: [birth year]  Today's Date: 08/04/2019 SLP Individual Time: 1100-1154 SLP Individual Time Calculation (min): 54 min  Short Term Goals: Week 3: SLP Short Term Goal 1 (Week 3): Pt will consume dys 1 textures and nectar thick liquids with min cues for use of swallowing precautions and minimal overt s/s of aspiration. SLP Short Term Goal 2 (Week 3): Patient will consume trials of thin liquids with minimal overt s/sx of aspiration and min cues for use of swallowing precautions over 3 consecutive sessions prior to repeat instrumental assessment. SLP Short Term Goal 3 (Week 3): Pt will produce vocalizations in coordination with movement of articulators in 50% of opportunities with max assist multimodal cues. SLP Short Term Goal 4 (Week 3): Pt will produce non speech oral motor movements on command as a precursor to speech tasks  in >50% of opportunities with mod assist multimodal cues.  Skilled Therapeutic Interventions:  Pt was seen for skilled ST targeting dysphagia and expressive language goals. Pt consumed ~20 trials of nectar thick liquid with Min verbal cues to utilize slow rate. Trace anterior loss and no overt s/s noted with nectar and dysphagia 1 (puree) POs. During upgraded trial of dysphagia 2 (minced), pt required Mod verbal cues for use of swallow strategies, and although he ultimately demonstrated adequate oral clearance of boluses, pt could only manipulate PO with exaggerated lingual movements; he could not voluntarily masticate. SLP determined pt could not tolerate full dysphagia 2 texture meal at this time. Pt also exhibited weak cough with 2 out of 8 ice chip trials, and he was only able to voluntarily cough when cued in 50% of attempts. Therefore, recommend continue dysphagia 1, nectar liquid diet. Will continue trials of upgraded solids and liquids as tolerated.   SLP also  facilitated session with Max verbal and visual cues to complete non-speech oral motor movements as a precursor to speech tasks. Pt could imitate opening and closing mouth with 100% accuracy, however unable to imitate smile or puckering lips. Pt attempted vocalization during an exaggerated sigh; 1 low intensity grunt achieved out of ~8 trials. Pt is using thumbs up and down to communicate yes/no and good/bad. Min A/prompts provided for pt to utilize notebook to communicate longer messages. Pt indicated one of his top goals is to "eat real food again." SLP educated pt regarding his dysphagia severity and etiology, however also acknowledged and validated his desire to continue to progress diet. Pt left sitting in wheelchair with chair alarm set, call bell and communication notebook within reach, all needs met. Continue per current plan of care.      Pain Pain Assessment Pain Scale: Faces Pain Score: 0-No pain  Therapy/Group: Individual Therapy  Arbutus Leas 08/04/2019, 11:51 AM

## 2019-08-04 NOTE — Progress Notes (Signed)
Nutrition Follow-up  DOCUMENTATION CODES:   Morbid obesity  INTERVENTION:   - ContinueEnsure Enlive poTID, each supplement provides 350 kcal and 20 grams of protein (thickened to nectar-thick consistency)  - ContinueMagic cup TID with meals, each supplement provides 290 kcal and 9 grams of protein  - ContinueVital Cuisine ShakeTID, each supplement provides 520 kcal and 22 grams of protein  - Continue MVI with minerals daily  NUTRITION DIAGNOSIS:   Inadequate oral intake related to dysphagia as evidenced by other (full liquid diet).  Progressing, pt now on Dysphagia 1 diet with nectar-thick liquids and PO intake improved  GOAL:   Patient will meet greater than or equal to 90% of their needs  Progressing  MONITOR:   PO intake, Supplement acceptance, Diet advancement, Labs, Weight trends, TF tolerance, I & O's  REASON FOR ASSESSMENT:   Consult Calorie Count  ASSESSMENT:   48 year old male with PMH of left cerebellar CVA September 2019, CAD non-STEMI status post stenting May 2019, HTN, CKD stage III, tobacco abuse, and medical noncompliance. Presented 07/10/19 with aphasia, right facial droop and right side weakness. UDS positive for marijuana. Cranial CT scan showd areas of prior infarction in the superior aspect of the left cerebellum. MRI/MRA showed occluded left internal carotid artery with reconstitution of the level of the left posterior communicating artery. Scattered punctate foci of cortical infarct involving the anterior left frontal lobe and left parietal lobe. Extensive left hemisphere acute subacute white matter infarcts involving the left centrum semiovale and corona radiata as well as border zone areas. Pt admitted to CIR on 7/31.  Discharge date planned for 8/26.  Weight stable compared to admission weight.  Met with pt at bedside. Pt indicates that he remembers this RD using a "thumbs up." Pt indicates that he continues to have a good appetite and is  eating well. Meal completion average improved slightly this week from 72% to 75%. Pt indicates he is drinking the Ensure Enlive shakes.  RD will continue with current supplement regimen.  Meal Completion: 50-100% (averaging 75%)  Medications reviewed and include: Ensure Enlive TID, Remeron, MVI with minerals, Senna IVF: NS @ 50 ml/hr x 12 hours q HS  Labs reviewed.  Diet Order:   Diet Order            DIET - DYS 1 Room service appropriate? Yes; Fluid consistency: Nectar Thick  Diet effective now              EDUCATION NEEDS:   No education needs have been identified at this time  Skin:  Skin Assessment: Reviewed RN Assessment  Last BM:  08/03/19  Height:   Ht Readings from Last 1 Encounters:  07/24/19 6' (1.829 m)    Weight:   Wt Readings from Last 1 Encounters:  08/04/19 (!) 146.9 kg    Ideal Body Weight:  80.9 kg  BMI:  Body mass index is 43.92 kg/m.  Estimated Nutritional Needs:   Kcal:  2200-2400  Protein:  110-125 grams  Fluid:  >/= 2.0 L    Gaynell Face, MS, RD, LDN Inpatient Clinical Dietitian Pager: [phone] Weekend/After Hours: [phone]

## 2019-08-04 NOTE — Progress Notes (Signed)
[facility] PHYSICAL MEDICINE & REHABILITATION PROGRESS NOTE  Subjective/Complaints:  Remains expressively aphasic but able to use thumbs up and down for Y/N response   ROS: limited due to language/communication , via thumbs up/down indicates no breathing problems , no bowel or bladder problems however per RN notes pt had incont BM  Objective: Vital Signs: Blood pressure (!) 155/82, pulse 85, temperature 98.2 F (36.8 C), temperature source Oral, resp. rate 16, height 6' (1.829 m), weight (!) 146.9 kg, SpO2 96 %. No results found. No results for input(s): WBC, HGB, HCT, PLT in the last 72 hours. No results for input(s): NA, K, CL, CO2, GLUCOSE, BUN, CREATININE, CALCIUM in the last 72 hours.  Physical Exam: BP (!) 155/82 (BP Location: Right Arm)   Pulse 85   Temp 98.2 F (36.8 C) (Oral)   Resp 16   Ht 6' (1.829 m)   Wt (!) 146.9 kg   SpO2 96%   BMI 43.92 kg/m  Constitutional: No distress . Vital signs reviewed. HEENT: EOMI, oral membranes moist Neck: supple Cardiovascular: RRR without murmur. No JVD    Respiratory: CTA Bilaterally without wheezes or rales. Normal effort    GI: BS +, non-tender, non-distended  Skin: Warm and dry.  Intact. Psych: Appears to be flat Musc: There is mild pain with passive ankle dorsiflexor patient also has clonus at the ankle.  There is no pain with ankle inversion eversion no evidence of ankle swelling or erythema.  No foot swelling or erythema. Neurological:Alert Right facial weakness Nonverbal. Motor: LUE/LLE: Grossly 4/5 Right upper extremity: Shoulder abduction 2/5, elbow flexion/extension 2-/5, handgrip 1/5. Apraxic, stable Right lower extremity: 2-/5 hip/knee ext synergy  Assessment/Plan: 1. Functional deficits secondary to left brain infarct with history of CVA which require 3+ hours per day of interdisciplinary therapy in a comprehensive inpatient rehab setting.  Physiatrist is providing close team supervision and 24 hour management  of active medical problems listed below.  Physiatrist and rehab team continue to assess barriers to discharge/monitor patient progress toward functional and medical goals  Care Tool:  Bathing    Body parts bathed by patient: Chest, Right arm, Abdomen, Front perineal area, Right upper leg, Left upper leg, Right lower leg, Left lower leg, Face, Buttocks   Body parts bathed by helper: Left arm     Bathing assist Assist Level: Moderate Assistance - Patient 50 - 74%     Upper Body Dressing/Undressing Upper body dressing   What is the patient wearing?: Pull over shirt    Upper body assist Assist Level: Minimal Assistance - Patient > 75%    Lower Body Dressing/Undressing Lower body dressing      What is the patient wearing?: Incontinence brief, Pants, Underwear/pull up     Lower body assist Assist for lower body dressing: Maximal Assistance - Patient 25 - 49%     Toileting Toileting    Toileting assist Assist for toileting: Maximal Assistance - Patient 25 - 49%     Transfers Chair/bed transfer  Transfers assist  Chair/bed transfer activity did not occur: Safety/medical concerns(Decreased strength/balance)  Chair/bed transfer assist level: Moderate Assistance - Patient 50 - 74%     Locomotion Ambulation   Ambulation assist   Ambulation activity did not occur: Safety/medical concerns(decreased strength/balance/endurance)  Assist level: Minimal Assistance - Patient > 75% Assistive device: Walker-rolling Max distance: 30 ft   Walk 10 feet activity   Assist  Walk 10 feet activity did not occur: Safety/medical concerns(decreased strength/balance/endurance)  Assist level: Minimal Assistance -  Patient > 75% Assistive device: Walker-rolling   Walk 50 feet activity   Assist Walk 50 feet with 2 turns activity did not occur: Safety/medical concerns  Assist level: Moderate Assistance - Patient - 50 - 74% Assistive device: Walker-rolling    Walk 150 feet  activity   Assist Walk 150 feet activity did not occur: Safety/medical concerns         Walk 10 feet on uneven surface  activity   Assist Walk 10 feet on uneven surfaces activity did not occur: Safety/medical concerns         Wheelchair     Assist   Type of Wheelchair: Manual Wheelchair activity did not occur: Safety/medical concerns(decreased strength/balance/endurance)  Wheelchair assist level: Maximal Assistance - Patient 25 - 49% Max wheelchair distance: 6625ft    Wheelchair 50 feet with 2 turns activity    Assist    Wheelchair 50 feet with 2 turns activity did not occur: Safety/medical concerns(decreased strength/balance/endurance)   Assist Level: Maximal Assistance - Patient 25 - 49%   Wheelchair 150 feet activity     Assist Wheelchair 150 feet activity did not occur: Safety/medical concerns(decreased strength/balance/endurance)          Medical Problem List and Plan: 1.Right side weakness, aphasia, dysphasiasecondary to left MCA and ACA scattered infarct due to left ICA occlusion as well as history of previous stroke in the past and medical noncompliance  Continue CIR, PT, OT SLP, team conf in am  PRAFO/WHO for RLE and RUE 2. Antithrombotics: -DVT/anticoagulation:SCDs -antiplatelet therapy: plavix 75mg daily 3. Pain Management:Tylenol as needed 4. Mood:Provide emotional support -antipsychotic agents: N/A 5. Neuropsych: This patientappears to be capable of making decisions on hisown behalf. 6. Skin/Wound Care:Routine skin checks 7. Fluids/Electrolytes/Nutrition: check bmet tomorrow 8. Post stroke dysphagia.   Advanced to D1 nectars 8/7, poor oral intake of fluids 590 mL on 8/15  Aspiration precautions  Pt doesn't want NGT  -intake fair 25-70% meals   Advance diet as tolerated  -continue IVF 9. History of CAD with myocardial infarction status post stenting. Continue Plavix. Patient is followed by Dr.  Dina RichJonathan Branch 10. Hypertension. Coreg 3.125 mg twice daily.   Hydralazine 10 3 times daily started on 7/31, increased to 25 3 times daily on 8/3  Norvasc 10 mg daily   Vitals:   08/03/19 2015 08/04/19 0616  BP: (!) 136/95 (!) 155/82  Pulse: 82 85  Resp: 15 16  Temp: 98.4 F (36.9 C) 98.2 F (36.8 C)  SpO2: 96% 96%  Controlled 8/18   No changes indicated today 11. Hyperlipidemia. Lipitor 12. History of tobacco as well as marijuana use. Counseling 13. Systolic CHF  Echo with EF of 40%40%  Chest x-ray personally reviewed, suggestive of cardiomegaly  Filed Weights   08/02/19 0424 08/03/19 0500 08/04/19 0500  Weight: (!) 147.4 kg (!) 147.8 kg (!) 146.9 kg   Lasix 20 daily DC'd on 8/7 due to limited p.o. intake, will consider restarting if appropriate  8/16 a bit up, monitor trend, I  600 cc yesterday  14.  CKD stage III  Creatinine 1.8 on 8/6,Improved to 1.66 IVF nightly started on 8/6 cont, reduce to 7150ml/hr recheck BMET in am, last BUN improved to 22  Continue to monitor 15.  Morbid obesity: Encouraged weight loss 16. Sleep disturbance  Melatonin started on 8/4, increased on 8/6 DC'd on 8/7  Remeron 7.5 started on 8/7 17.  UTI not S to keflex on  macrobid through 8/20 18.  Right ankle pain likely developing  some contracture does have increased tone in the plantar flexors will order PR AFO LOS: 18 days A FACE TO FACE EVALUATION WAS PERFORMED  Erick Colace 08/04/2019, 9:12 AM

## 2019-08-05 ENCOUNTER — Inpatient Hospital Stay (HOSPITAL_COMMUNITY): Payer: Self-pay | Admitting: Speech Pathology

## 2019-08-05 ENCOUNTER — Inpatient Hospital Stay (HOSPITAL_COMMUNITY): Payer: Self-pay

## 2019-08-05 ENCOUNTER — Inpatient Hospital Stay (HOSPITAL_COMMUNITY): Payer: Self-pay | Admitting: Occupational Therapy

## 2019-08-05 NOTE — Progress Notes (Signed)
Speech Language Pathology Daily Session Note  Patient Details  Name: Zachary Mccann MRN: [id number] Date of Birth: 20-May-1971  Today's Date: 08/05/2019 SLP Individual Time: 1303-1400 SLP Individual Time Calculation (min): 57 min  Short Term Goals: Week 3: SLP Short Term Goal 1 (Week 3): Pt will consume dys 1 textures and nectar thick liquids with min cues for use of swallowing precautions and minimal overt s/s of aspiration. SLP Short Term Goal 2 (Week 3): Patient will consume trials of thin liquids with minimal overt s/sx of aspiration and min cues for use of swallowing precautions over 3 consecutive sessions prior to repeat instrumental assessment. SLP Short Term Goal 3 (Week 3): Pt will produce vocalizations in coordination with movement of articulators in 50% of opportunities with max assist multimodal cues. SLP Short Term Goal 4 (Week 3): Pt will produce non speech oral motor movements on command as a precursor to speech tasks  in >50% of opportunities with mod assist multimodal cues.  Skilled Therapeutic Interventions: Pt was seen for skilled ST services focused on dysphagia and speech/language goals. Pt was more animated upon our greeting today and when provided Supervision assist he utilized paper and pen to communicate throughout session. He was somewhat perseverative on leaving the hospital today, but redirectable. Pt consumed nectar thick liquids and puree lunch tray items with fluctuating Min-Mod A verbal cueing to utilize slow rate and small bites; pt continues to display impulsivity while eating. One immediate cough following puree and Min anterior loss of liquids noted. Pt able to imitate open and close mouth oral motor movement as precursor to speech tasks with 100% accuracy when provided Max visual/verbal cues and feedback. One instance of phonation achieved when pushing arms down on wheelchair and "bearing down." Pt unable to imitate smile, however he smiled spontaneously as reaction  to conversation. Pt was left sitting       Pain Pain Assessment Pain Score: 0-No pain  Therapy/Group: Individual Therapy  Arbutus Leas 08/05/2019, 3:46 PM

## 2019-08-05 NOTE — Patient Care Conference (Signed)
Inpatient RehabilitationTeam Conference and Plan of Care Update Date: 08/05/2019   Time: 11:20 AM    Patient Name: Zachary Mccann      Medical Record Number: [id number]  Date of Birth: Jan 14, 1971 Sex: Male         Room/Bed: 8B33O/3A91B-16 Payor Info: Payor: MEDICAID PENDING / Plan: MEDICAID PENDING / Product Type: *No Product type* /    Admitting Diagnosis: 6. CVA 2 Team  LT. CVA; 22-24days  Admit Date/Time:  07/17/2019  2:53 PM Admission Comments: No comment available   Primary Diagnosis:  <principal problem not specified> Principal Problem: <principal problem not specified>  Patient Active Problem List   Diagnosis Date Noted  . Hemiparesis affecting dominant side as late effect of stroke (HCC)   . Labile blood pressure   . Sleep disturbance   . Benign hypertensive heart and kidney disease with diastolic CHF, NYHA class II and CKD stage III (HCC)   . Chronic systolic congestive heart failure (HCC)   . Morbid obesity (HCC)   . Uncontrolled hypertension   . Dysphagia, post-stroke   . Cardiomegaly   . Acute systolic congestive heart failure (HCC)   . 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 Cerebral embolism with cerebral infarction 07/11/2019  . Acute CVA (cerebrovascular accident) (HCC) 07/11/2019  . Slurred speech 07/10/2019  . Cardiomyopathy (HCC) 11/20/2018  . History of stroke 09/06/2018  . Tobacco use 09/06/2018  . History of non-ST elevation myocardial infarction (NSTEMI) 05/05/2018  . Severe Vitamin D deficiency 04/02/2018  . Community acquired pneumonia of left lower lobe of lung (HCC) 04/02/2018  . CKD (chronic kidney disease), stage III (HCC) = Secondary to Hypertension 04/02/2018  . Pneumonia 03/30/2018  . Metabolic acidosis 03/30/2018  . AKI (acute kidney injury) (HCC) 03/30/2018  . N&V (nausea and vomiting) 03/30/2018  . Essential hypertension 03/30/2018    Expected Discharge Date: Expected Discharge Date: 08/12/19  Team Members  Present: Physician leading conference: Dr. Claudette Laws Social Worker Present: Dossie Der, LCSW Nurse Present: Other (comment)(Danny Gleeer-RN) PT Present: Woodfin Ganja, PT OT Present: Perrin Maltese, OT SLP Present: Feliberto Gottron, SLP PPS Coordinator present : Fae Pippin, SLP     Current Status/Progress Goal Weekly Team Focus  Medical   requiring IV fluids, UTI treated, severe aphasia, incont with urine  adequate po intake of fluids  complete Abx, wean IV fluid   Bowel/Bladder   Pt incontinent B/B. LBM 08/04/2019  Encourage timed toileting. Maintain regular bowel pattern.  Assist with toileting needs PRN.   Swallow/Nutrition/ Hydration   Mod A, dys 1 and NTL     Trials of thin and dys 2, swallow strategies for oral control and education   ADL's   Min assist for UB bathing with min to mod for UB dressing.  Mod to max for LB dressing with mod assist for LB bathing sit to stand.  Transfers to the toilet and the walk-in shower are mod assist as well.  Max assist for LUE functional use during bathing.  Greater digit movement this week in the RUE but still apraxic with more limitations at the shoulder.  Currently Brunnstrum stage IV in the hand and stage III in the arm.         Mobility   mod assist bed mobility, min-mod assist sit<>stands, min-mod assist transfers and gait up to 50 ft with RW  min assist bed mobility, transfers, and gait 38ft; mod assist 2 stair navigation; and supervision w/c propulsion  NMR, motor planning,  d/c planning, education, balance, transfers, gait   Communication   Min-Supervision A multimodal, Total-Max A vocalize  Mod I multimodal, Max A vocalize  oral motor movements, vocalize, phrase/sentence expression and education   Safety/Cognition/ Behavioral Observations            Pain   No pain via Faces pain scale  Remain pain free  Assess pain Q shift and PRN   Skin   No skin issues  Maintain skin integrity and prevent skin breakdown  Assess skin Q  shift and PRN      *See Care Plan and progress notes for long and short-term goals.     Barriers to Discharge  Current Status/Progress Possible Resolutions Date Resolved   Physician    Medical stability;Incontinence;Weight     Slowly progressing  encourage intake of thickened liquids throught the day      Nursing                  PT                    OT                  SLP                SW                Discharge Planning/Teaching Needs:  Will need to get fiance in to begin education/training in prepartion for discharge next week. Pt somewhat down due to not making as pmuch progress as thought he would. Will ask neuro-pysch to see      Team Discussion:  Progressing toward his min assist level goals. Still on Dys 1 nectar thick liquids, trials of Dys 2. Still getting IV's at night for supplemental hydration. Need to encourage fluid intake so can discharge iV's.  Thumbs up or down for communication, no verbalizations. More continent.  Need to begin family training this week with fiance.  Revisions to Treatment Plan:  DC 8/26    Continued Need for Acute Rehabilitation Level of Care: The patient requires daily medical management by a physician with specialized training in physical medicine and rehabilitation for the following conditions: Daily direction of a multidisciplinary physical rehabilitation program to ensure safe treatment while eliciting the highest outcome that is of practical value to the patient.: Yes Daily medical management of patient stability for increased activity during participation in an intensive rehabilitation regime.: Yes Daily analysis of laboratory values and/or radiology reports with any subsequent need for medication adjustment of medical intervention for : Neurological problems;Nutritional problems   I attest that I was present, lead the team conference, and concur with the assessment and plan of the team. Teleconference held due to COVID  19   Tareka Jhaveri, Gardiner Rhyme 08/05/2019, 3:32 PM

## 2019-08-05 NOTE — Progress Notes (Signed)
Physical Therapy Weekly Progress Note  Patient Details  Name: Zachary Mccann MRN: [id number] Date of Birth: 07-14-1971  Beginning of progress report period: July 29, 2019 End of progress report period: August 05, 2019  Today's Date: 08/05/2019 PT Individual Time: 1500-1510 PT Individual Time Calculation (min): 10 min   Patient has met 3 of 3 short term goals.  Pt continues to require max cues for R step through and L lateral weightshifting during gait and transfers. Pt also continues to be limited by behavior and is unwilling to participate in therapy at times.   Patient continues to demonstrate the following deficits muscle weakness, decreased coordination and decreased motor planning, decreased attention, decreased problem solving and decreased safety awareness and decreased standing balance, decreased postural control, hemiplegia and decreased balance strategies and therefore will continue to benefit from skilled PT intervention to increase functional independence with mobility.  Patient progressing toward long term goals..  Continue plan of care.  PT Short Term Goals Week 2:  PT Short Term Goal 1 (Week 2): Pt will perform sit<>stand with min assist >75% of the time PT Short Term Goal 1 - Progress (Week 2): Met PT Short Term Goal 2 (Week 2): Pt will ambulate x 50 ft with LRAD and mod assist PT Short Term Goal 2 - Progress (Week 2): Met PT Short Term Goal 3 (Week 2): Pt will perform stand pivot transfers with mod assist and LRAD PT Short Term Goal 3 - Progress (Week 2): Met Week 3:  PT Short Term Goal 1 (Week 3): STG=LTG due to ELOS  Skilled Therapeutic Interventions/Progress Updates:    Pt seated in w/c upon PT arrival, pt greet therapist with a thumbs down. Therapist explains that it is time for therapist, pt continues to give a thumbs down and refuses therapy tx. Pt pointing to his bed. Therapist encouraged pt in participation in therapy, educated pt on importance of therapy tx. Pt  continues to give thumbs down. Therapist assisted pt to bed, stand pivot with min assist and then sit>supine with min assist. Therapist suggested doing some bed level LE exercises, pt continues to give thumbs down. Therapist performed R UE PROM for tone management and positioned pt's UE in extended elbow position with shoulder external rotation. Pt continues to give thumbs down throughout session. Pt missed 35 minutes of skilled therapy tx this session secondary to pt refusal, unwilling to participate.   Therapy Documentation Precautions:  Precautions Precautions: Fall Precaution Comments: right hemiparesis Restrictions Weight Bearing Restrictions: No   Therapy/Group: Individual Therapy  Netta Corrigan, PT, DPT 08/05/2019, 7:40 AM

## 2019-08-05 NOTE — Progress Notes (Signed)
[facility] PHYSICAL MEDICINE & REHABILITATION PROGRESS NOTE  Subjective/Complaints:  Sitting in bed remains non verbal   ROS: limited due to language/communication , Objective: Vital Signs: Blood pressure 119/85, pulse 89, temperature 98.9 F (37.2 C), temperature source Oral, resp. rate 18, height 6' (1.829 m), weight (!) 148 kg, SpO2 100 %. No results found. No results for input(s): WBC, HGB, HCT, PLT in the last 72 hours. No results for input(s): NA, K, CL, CO2, GLUCOSE, BUN, CREATININE, CALCIUM in the last 72 hours.  Physical Exam: BP 119/85 (BP Location: Right Arm)   Pulse 89   Temp 98.9 F (37.2 C) (Oral)   Resp 18   Ht 6' (1.829 m)   Wt (!) 148 kg   SpO2 100%   BMI 44.25 kg/m  Constitutional: No distress . Vital signs reviewed. HEENT: EOMI, oral membranes moist Neck: supple Cardiovascular: RRR without murmur. No JVD    Respiratory: CTA Bilaterally without wheezes or rales. Normal effort    GI: BS +, non-tender, non-distended  Skin: Warm and dry.  Intact. Psych: Appears to be flat Musc: There is mild pain with passive ankle dorsiflexor patient also has clonus at the ankle.  There is no pain with ankle inversion eversion no evidence of ankle swelling or erythema.  No foot swelling or erythema. Neurological:Alert Right facial weakness Nonverbal. Motor: LUE/LLE: Grossly 4/5 Right upper extremity: Shoulder abduction 2/5, elbow flexion/extension 2-/5, handgrip 1/5. Apraxic, stable Right lower extremity: 2-/5 hip/knee ext synergy  Assessment/Plan: 1. Functional deficits secondary to left brain infarct with history of CVA which require 3+ hours per day of interdisciplinary therapy in a comprehensive inpatient rehab setting.  Physiatrist is providing close team supervision and 24 hour management of active medical problems listed below.  Physiatrist and rehab team continue to assess barriers to discharge/monitor patient progress toward functional and medical goals  Care  Tool:  Bathing    Body parts bathed by patient: Chest, Right arm, Abdomen, Front perineal area, Right upper leg, Left upper leg, Right lower leg, Left lower leg, Face, Buttocks   Body parts bathed by helper: Left arm     Bathing assist Assist Level: Moderate Assistance - Patient 50 - 74%     Upper Body Dressing/Undressing Upper body dressing   What is the patient wearing?: Pull over shirt    Upper body assist Assist Level: Minimal Assistance - Patient > 75%    Lower Body Dressing/Undressing Lower body dressing      What is the patient wearing?: Incontinence brief, Pants, Underwear/pull up     Lower body assist Assist for lower body dressing: Maximal Assistance - Patient 25 - 49%     Toileting Toileting    Toileting assist Assist for toileting: Maximal Assistance - Patient 25 - 49%     Transfers Chair/bed transfer  Transfers assist  Chair/bed transfer activity did not occur: Safety/medical concerns(Decreased strength/balance)  Chair/bed transfer assist level: Minimal Assistance - Patient > 75%     Locomotion Ambulation   Ambulation assist   Ambulation activity did not occur: Safety/medical concerns(decreased strength/balance/endurance)  Assist level: Minimal Assistance - Patient > 75% Assistive device: Walker-rolling Max distance: 40 ft   Walk 10 feet activity   Assist  Walk 10 feet activity did not occur: Safety/medical concerns(decreased strength/balance/endurance)  Assist level: Minimal Assistance - Patient > 75% Assistive device: Walker-rolling   Walk 50 feet activity   Assist Walk 50 feet with 2 turns activity did not occur: Safety/medical concerns  Assist level: Moderate Assistance -  Patient - 50 - 74% Assistive device: Walker-rolling    Walk 150 feet activity   Assist Walk 150 feet activity did not occur: Safety/medical concerns         Walk 10 feet on uneven surface  activity   Assist Walk 10 feet on uneven surfaces activity  did not occur: Safety/medical concerns         Wheelchair     Assist   Type of Wheelchair: Manual Wheelchair activity did not occur: Safety/medical concerns(decreased strength/balance/endurance)  Wheelchair assist level: Maximal Assistance - Patient 25 - 49% Max wheelchair distance: 68f    Wheelchair 50 feet with 2 turns activity    Assist    Wheelchair 50 feet with 2 turns activity did not occur: Safety/medical concerns(decreased strength/balance/endurance)   Assist Level: Maximal Assistance - Patient 25 - 49%   Wheelchair 150 feet activity     Assist Wheelchair 150 feet activity did not occur: Safety/medical concerns(decreased strength/balance/endurance)          Medical Problem List and Plan: 1.Right side weakness, aphasia, dysphasiasecondary to left MCA and ACA scattered infarct due to left ICA occlusion as well as history of previous stroke in the past and medical noncompliance  Continue CIR, PT, OT SLP, Team conference today please see physician documentation under team conference tab, met with team face-to-face to discuss problems,progress, and goals. Formulized individual treatment plan based on medical history, underlying problem and comorbidities. PRAFO/WHO for RLE and RUE 2. Antithrombotics: -DVT/anticoagulation:SCDs -antiplatelet therapy: plavix 775maily 3. Pain Management:Tylenol as needed 4. Mood:Provide emotional support -antipsychotic agents: N/A 5. Neuropsych: This patientappears to be capable of making decisions on hisown behalf. 6. Skin/Wound Care:Routine skin checks 7. Fluids/Electrolytes/Nutrition: check bmet tomorrow 8. Post stroke dysphagia.   Advanced to D1 nectars 8/7, poor oral intake of fluids 590 mL on 8/15  Aspiration precautions  Pt doesn't want NGT  -intake fair 25-70% meals   Advance diet as tolerated  -continue IVF 9. History of CAD with myocardial infarction status post stenting.  Continue Plavix. Patient is followed by Dr. JoCarlyle Dolly0. Hypertension. Coreg 3.125 mg twice daily.   Hydralazine 10 3 times daily started on 7/31, increased to 25 3 times daily on 8/3  Norvasc 10 mg daily   Vitals:   08/04/19 1518 08/04/19 1950  BP: 100/63 119/85  Pulse: 87 89  Resp: 18 18  Temp: 98.8 F (37.1 C) 98.9 F (37.2 C)  SpO2: 96% 100%  Controlled 8/19   No changes indicated today 11. Hyperlipidemia. Lipitor 12. History of tobacco as well as marijuana use. Counseling 13. Systolic CHF  Echo with EF of 40%  Chest x-ray personally reviewed, suggestive of cardiomegaly  Filed Weights   08/03/19 0500 08/04/19 0500 08/05/19 0449  Weight: (!) 147.8 kg (!) 146.9 kg (!) 148 kg   Lasix 20 daily DC'd on 8/7 due to limited p.o. intake, will consider restarting if appropriate  8/16 a bit up, monitor trend, I  600 cc yesterday  14.  CKD stage III  Creatinine 1.8 on 8/6,Improved to 1.66 IVF nightly started on 8/6 cont, reduce to 5059mr recheck BMET in am, last BUN improved to 22  Continue to monitor 15.  Morbid obesity: Encouraged weight loss 16. Sleep disturbance  Melatonin started on 8/4, increased on 8/6 DC'd on 8/7  Remeron 7.5 started on 8/7 17.  UTI not S to keflex on  macrobid through 8/20 18.  Right ankle pain likely developing some contracture does have increased tone in the  plantar flexors will order PR AFO LOS: 19 days A FACE TO FACE EVALUATION WAS PERFORMED  Charlett Blake 08/05/2019, 8:04 AM

## 2019-08-05 NOTE — Progress Notes (Signed)
Occupational Therapy Session Note  Patient Details  Name: Zachary Mccann MRN: [id number] Date of Birth: 1971-01-12  Today's Date: 08/05/2019 OT Individual Time: 1003-1100 OT Individual Time Calculation (min): 57 min    Short Term Goals: Week 3:  OT Short Term Goal 1 (Week 3): Pt will complete bathing sit to stand with min assist. OT Short Term Goal 2 (Week 3): Pt will donn pullover shirt with min assist and mod demonstrational cueing. OT Short Term Goal 3 (Week 3): Pt will complete toileting with mod assist sit to stand. OT Short Term Goal 4 (Week 3): Pt will use the RUE as a gross assist with min facilitation during grooming and bathing task to hold items to be opened.  Skilled Therapeutic Interventions/Progress Updates:    Pt completed supine to sit with mod assist in preparation for transfer to the shower.  Mod assist for sit to stand with bladder urgency noted in standing initially.  Mod assist for ambulation to the shower with pt needing mod assist to advance the RLE at times secondary to decreased weightshift off of the RLE.  Min assist for UB bathing in sitting with mod assist for LB bathing.  He needs mod instructional cueing for thoroughness to rinse the right side secondary to right inattention.  Mod assist for donning pullover shirt with max assist for LB dressing following hemi dressing techniques.  Finished session with pt in the wheelchair with call button and phone in reach as well as safety alarm belt.    Therapy Documentation Precautions:  Precautions Precautions: Fall Precaution Comments: right hemiparesis Restrictions Weight Bearing Restrictions: No   Pain: Pain Assessment Pain Scale: Faces Pain Score: 0-No pain ADL: See Care Tool Section for some details of ADL  Therapy/Group: Individual Therapy  Alesi Zachery OTR/L 08/05/2019, 11:07 AM

## 2019-08-05 NOTE — Progress Notes (Signed)
Social Work Patient ID: Zachary Mccann, male   DOB: 01-Sep-1971, 48 y.o.   MRN: [id number]  Met with pt and spoke with Dewaine Oats via telephone to discuss team conference progress toward his goals of min assist level and discharge still 8/26. Will need to begin family training, have scheduled Dewaine Oats for Friday @ 9:00-12:00 and then again next week also. Will work on discharge needs.

## 2019-08-05 NOTE — Plan of Care (Signed)
  Problem: Consults Goal: RH STROKE PATIENT EDUCATION Description: See Patient Education module for education specifics  Outcome: Progressing   Problem: Consults Goal: RH STROKE PATIENT EDUCATION Description: See Patient Education module for education specifics  Outcome: Progressing   Problem: Consults Goal: RH STROKE PATIENT EDUCATION Description: See Patient Education module for education specifics  Outcome: Progressing

## 2019-08-05 NOTE — Plan of Care (Addendum)
Pt able to feed self somewhat, frequent reminders regarding aspiration precautions offered during meals.

## 2019-08-06 ENCOUNTER — Inpatient Hospital Stay (HOSPITAL_COMMUNITY): Payer: Self-pay | Admitting: Speech Pathology

## 2019-08-06 ENCOUNTER — Inpatient Hospital Stay (HOSPITAL_COMMUNITY): Payer: Self-pay

## 2019-08-06 ENCOUNTER — Inpatient Hospital Stay (HOSPITAL_COMMUNITY): Payer: Self-pay | Admitting: Physical Therapy

## 2019-08-06 NOTE — Progress Notes (Signed)
Speech Language Pathology Daily Session Note  Patient Details  Name: Zachary Mccann MRN: [id number] Date of Birth: [birth year]  Today's Date: 08/06/2019 SLP Individual Time: 2671-2458 SLP Individual Time Calculation (min): 57 min  Short Term Goals: Week 3: SLP Short Term Goal 1 (Week 3): Pt will consume dys 1 textures and nectar thick liquids with min cues for use of swallowing precautions and minimal overt s/s of aspiration. SLP Short Term Goal 2 (Week 3): Patient will consume trials of thin liquids with minimal overt s/sx of aspiration and min cues for use of swallowing precautions over 3 consecutive sessions prior to repeat instrumental assessment. SLP Short Term Goal 3 (Week 3): Pt will produce vocalizations in coordination with movement of articulators in 50% of opportunities with max assist multimodal cues. SLP Short Term Goal 4 (Week 3): Pt will produce non speech oral motor movements on command as a precursor to speech tasks  in >50% of opportunities with mod assist multimodal cues.  Skilled Therapeutic Interventions: Pt was seen for skilled ST services focused on dysphagia and speech goals. Following thorough oral care, SLP facilitated session with skilled observation of pt's consumption of uprgraded ice chip and thin liquid trials. Although min anterior spill noted due to decreased labial seal, no overt s/s aspiration were observed throughout trials of 20 ice chips, 10 teaspoons of thin water, and 6 small cup sips of thin water. Oral phase appears to be prolonged, however swallow initiation appeared to become more timely as trials progressed (given palpation). Will continue trials prior to follow up MBS to reassess pharyngeal swallow prior to pt's discharge. Provided Max visual and verbal cues, pt imitated oral motor movements (open and close mouth, smile, squeeze lips together) as precursor to speech tasks with ~90% accuracy. During speech tasks, pt achieved phonation during 1 spontaneous  laugh and 1 grunt (when pushing against arms of his wheelchair). Pt was let sitting in wheelchair with chair alarm set, call bell within reach, and nursing present. Continue per current plan of care.        Pain Pain Assessment Faces Pain Scale: Hurts little more Pain Type: Acute pain Pain Location: Ankle Pain Orientation: Right Pain Descriptors / Indicators: Grimacing Pain Onset: On-going Pain Intervention(s): RN made aware;Repositioned;Emotional support;Distraction  Therapy/Group: Individual Therapy  Arbutus Leas 08/06/2019, 12:27 PM

## 2019-08-06 NOTE — Progress Notes (Signed)
[facility] PHYSICAL MEDICINE & REHABILITATION PROGRESS NOTE  Subjective/Complaints:  Still using thumbs  Up / down   ROS: limited due to language/communication , Objective: Vital Signs: Blood pressure 122/68, pulse 80, temperature 98.8 F (37.1 C), temperature source Oral, resp. rate 19, height 6' (1.829 m), weight (!) 151.2 kg, SpO2 98 %. No results found. No results for input(s): WBC, HGB, HCT, PLT in the last 72 hours. No results for input(s): NA, K, CL, CO2, GLUCOSE, BUN, CREATININE, CALCIUM in the last 72 hours.  Physical Exam: BP 122/68 (BP Location: Left Arm)   Pulse 80   Temp 98.8 F (37.1 C) (Oral)   Resp 19   Ht 6' (1.829 m)   Wt (!) 151.2 kg   SpO2 98%   BMI 45.21 kg/m  Constitutional: No distress . Vital signs reviewed. HEENT: EOMI, oral membranes moist Neck: supple Cardiovascular: RRR without murmur. No JVD    Respiratory: CTA Bilaterally without wheezes or rales. Normal effort    GI: BS +, non-tender, non-distended  Skin: Warm and dry.  Intact. Psych: Appears to be flat Musc: There is mild pain with passive ankle dorsiflexor patient also has clonus at the ankle.  There is no pain with ankle inversion eversion no evidence of ankle swelling or erythema.  No foot swelling or erythema. Neurological:Alert Right facial weakness Nonverbal. Motor: LUE/LLE: Grossly 4/5 Right upper extremity: Shoulder abduction 2/5, elbow flexion/extension 2-/5, handgrip 1/5. Apraxic, stable Right lower extremity: 2-/5 hip/knee ext synergy  Assessment/Plan: 1. Functional deficits secondary to left brain infarct with history of CVA which require 3+ hours per day of interdisciplinary therapy in a comprehensive inpatient rehab setting.  Physiatrist is providing close team supervision and 24 hour management of active medical problems listed below.  Physiatrist and rehab team continue to assess barriers to discharge/monitor patient progress toward functional and medical goals  Care  Tool:  Bathing    Body parts bathed by patient: Chest, Right arm, Abdomen, Front perineal area, Right upper leg, Left upper leg, Right lower leg, Left lower leg, Face, Buttocks   Body parts bathed by helper: Left arm     Bathing assist Assist Level: Moderate Assistance - Patient 50 - 74%     Upper Body Dressing/Undressing Upper body dressing   What is the patient wearing?: Pull over shirt    Upper body assist Assist Level: Moderate Assistance - Patient 50 - 74%    Lower Body Dressing/Undressing Lower body dressing      What is the patient wearing?: Incontinence brief, Pants, Underwear/pull up     Lower body assist Assist for lower body dressing: Maximal Assistance - Patient 25 - 49%     Toileting Toileting    Toileting assist Assist for toileting: Maximal Assistance - Patient 25 - 49%     Transfers Chair/bed transfer  Transfers assist  Chair/bed transfer activity did not occur: Safety/medical concerns(Decreased strength/balance)  Chair/bed transfer assist level: Minimal Assistance - Patient > 75%     Locomotion Ambulation   Ambulation assist   Ambulation activity did not occur: Safety/medical concerns(decreased strength/balance/endurance)  Assist level: Moderate Assistance - Patient 50 - 74% Assistive device: Walker-rolling Max distance: 10-12'   Walk 10 feet activity   Assist  Walk 10 feet activity did not occur: Safety/medical concerns(decreased strength/balance/endurance)  Assist level: Minimal Assistance - Patient > 75% Assistive device: Walker-rolling   Walk 50 feet activity   Assist Walk 50 feet with 2 turns activity did not occur: Safety/medical concerns  Assist level:  Moderate Assistance - Patient - 50 - 74% Assistive device: Walker-rolling    Walk 150 feet activity   Assist Walk 150 feet activity did not occur: Safety/medical concerns         Walk 10 feet on uneven surface  activity   Assist Walk 10 feet on uneven surfaces  activity did not occur: Safety/medical concerns         Wheelchair     Assist   Type of Wheelchair: Manual Wheelchair activity did not occur: Safety/medical concerns(decreased strength/balance/endurance)  Wheelchair assist level: Maximal Assistance - Patient 25 - 49% Max wheelchair distance: 69ft    Wheelchair 50 feet with 2 turns activity    Assist    Wheelchair 50 feet with 2 turns activity did not occur: Safety/medical concerns(decreased strength/balance/endurance)   Assist Level: Maximal Assistance - Patient 25 - 49%   Wheelchair 150 feet activity     Assist Wheelchair 150 feet activity did not occur: Safety/medical concerns(decreased strength/balance/endurance)          Medical Problem List and Plan: 1.Right side weakness, aphasia, dysphasiasecondary to left MCA and ACA scattered infarct due to left ICA occlusion as well as history of previous stroke in the past and medical noncompliance  Continue CIR, PT, OT SLP PRAFO/WHO for RLE and RUE 2. Antithrombotics: -DVT/anticoagulation:SCDs -antiplatelet therapy: plavix 75mg daily 3. Pain Management:Tylenol as needed 4. Mood:Provide emotional support -antipsychotic agents: N/A 5. Neuropsych: This patientappears to be capable of making decisions on hisown behalf. 6. Skin/Wound Care:Routine skin checks 7. Fluids/Electrolytes/Nutrition: check bmet tomorrow 8. Post stroke dysphagia.   Advanced to D1 nectars 8/7, poor oral intake of fluids 590 mL on 8/15  Aspiration precautions  Pt doesn't want NGT  -intake fair 25-70% meals   Advance diet as tolerated  -continue IVF 9. History of CAD with myocardial infarction status post stenting. Continue Plavix. Patient is followed by Dr. Dina Rich 10. Hypertension. Coreg 3.125 mg twice daily.   Hydralazine 10 3 times daily started on 7/31, increased to 25 3 times daily on 8/3  Norvasc 10 mg daily   Vitals:   08/05/19  2136 08/06/19 0652  BP: 123/75 122/68  Pulse: 85 80  Resp:  19  Temp:  98.8 F (37.1 C)  SpO2:  98%  Controlled 8/19   No changes indicated today 11. Hyperlipidemia. Lipitor 12. History of tobacco as well as marijuana use. Counseling 13. Systolic CHF  Echo with EF of 45%  Chest x-ray personally reviewed, suggestive of cardiomegaly  Filed Weights   08/04/19 0500 08/05/19 0449 08/06/19 0709  Weight: (!) 146.9 kg (!) 148 kg (!) 151.2 kg   Lasix 20 daily DC'd on 8/7 due to limited p.o. intake, will consider restarting if appropriate  8/16 a bit up, monitor trend, I  600 cc yesterday  14.  CKD stage III  Creatinine 1.8 on 8/6,Improved to 1.66 IVF nightly started on 8/6 cont, reduce to 83ml/hr recheck BMET Continue to monitor 15.  Morbid obesity: Encouraged weight loss 16. Sleep disturbance  Melatonin started on 8/4, increased on 8/6 DC'd on 8/7  Remeron 7.5 started on 8/7 17.  UTI not S to keflex on  macrobid through today  18.  Right ankle pain likely developing some contracture does have increased tone in the plantar flexors will order PR AFO LOS: 20 days A FACE TO FACE EVALUATION WAS PERFORMED  Erick Colace 08/06/2019, 8:06 AM

## 2019-08-06 NOTE — Progress Notes (Signed)
Social Work Patient ID: Zachary Mccann, male   DOB: 1971/03/05, 48 y.o.   MRN: [id number]    Diagnosis codes: I63.9, I69.391 & I50.21  Height:  6'0              Weight: 323 lbs           Patient suffers from L-CVA  which impairs his ability to perform daily activities like ADL's and tolieting   in the home.  A walker  will not resolve issue with performing activities of daily living.  A wheelchair will allow patient to safely perform daily activities.  Patient is not able to propel themselves in the home using a standard weight wheelchair due to endurance and fatigue .  Patient can self propel in the lightweight wheelchair.

## 2019-08-06 NOTE — Progress Notes (Signed)
Physical Therapy Session Note  Patient Details  Name: [name] MRN: [id number] Date of Birth: Aug 08, 1971  Today's Date: 08/06/2019 PT Individual Time: 0807-0919 PT Individual Time Calculation (min): 72 min   Short Term Goals: Week 3:  PT Short Term Goal 1 (Week 3): STG=LTG due to ELOS  Skilled Therapeutic Interventions/Progress Updates:    Pt received supine in bed and agreeable to therapy session via thumbs up. Nurse tech arriving with pt's breakfast tray and pt requesting to eat now rather than after therapy session. Supine>sit, HOB partially elevated and using bedrails, with mod assist for trunk upright and max cuing for sequencing of LEs to assist with improved trunk upright. Seated scoot towards EOB with min/mod assist for shifting hips. Sat EOB while therapist provided manual facilitation for weight bearing through R UE and pt ate using L UE - cuing throughout for small sips and bites per SLP notes - no coughing but pt only agreeable to eat the applesauce and drink orange juice. Sit>stand elevated EOB>RW with mod assist for lifting and balance as pt demonstrates R lateral trunk lean upon coming to standing - manual facilitation for improved anterior trunk lean and cuing for R LE positioning prior to standing. Stand pivot to w/c using RW with min/mod assist for balance and manual facilitation for increased L lateral weight shift when stepping with R LE. Performed oral care at sink with max assist - pt with increased difficulty spitting toothpaste out of his mouth.  Transported to/from gym in w/c. Sit>stand w/c>RW again requiring mod assist for lifting and balance. Ambulated ~33ft using RW with min assist for steadying and w/c follow for safety - pt slowing down speed and upon questioning pt pointing to R ankle and replying with thumbs up confirming that is hurts. Provided seated rest break and therapist assessed R foot and ankle to be swollen - provided ice pack and ACE compression wrap as  well as an elevating leg rest for pt to use while sitting in w/c. Attempted a stand pivot transfer and then a squat pivot transfer but pt unable to use R LE adequately to perform either transfer due to reporting continued R ankle pain. Transported back to room in w/c. Therapist notified RN and PA of pt's pain and swelling as well as left ice pack with compression on pt's R ankle with it elevated - pt left sitting in w/c with needs in reach and seat belt alarm on.   Therapist returned 70minutes later and removed ice pack and ACE compression wrap - skin was intact and pt reported some relief from his pain.   Therapy Documentation Precautions:  Precautions Precautions: Fall Precaution Comments: right hemiparesis Restrictions Weight Bearing Restrictions: No  Pain: Reports R ankle pain that limits pt participation in standing activities - provided ice, compression, and elevation as well as notified RN and PA.    Therapy/Group: Individual Therapy  Tawana Scale, PT, DPT 08/06/2019, 7:54 AM

## 2019-08-06 NOTE — Progress Notes (Signed)
Occupational Therapy Session Note  Patient Details  Name: Zachary Mccann MRN: [id number] Date of Birth: August 01, 1971  Today's Date: 08/06/2019 OT Individual Time: 6269-4854 OT Individual Time Calculation (min): 57 min    Short Term Goals: Week 1:  OT Short Term Goal 1 (Week 1): Pt will complete transfer to [facility] with LRAD with mod +1 OT Short Term Goal 1 - Progress (Week 1): Met OT Short Term Goal 2 (Week 1): Pt will don shirt with min A OT Short Term Goal 2 - Progress (Week 1): Not met OT Short Term Goal 3 (Week 1): Pt will require no more than min verbal cueing for use of hemi techniques during dressing OT Short Term Goal 3 - Progress (Week 1): Not met OT Short Term Goal 4 (Week 1): Pt will complete peri hygiene in standing with max A OT Short Term Goal 4 - Progress (Week 1): Met  Skilled Therapeutic Interventions/Progress Updates:    1:1. Pt reporting pain in R ankle and R hand. RN alerted to deliver pain medication. Pt indicating pain with thumbs up and pointing to pain places. Pt agreeable to working on RUE after changing shirt. Pt completes UB dressing with MAX A overall and OT cuts side of shirt for improved fit. Pt completes turning over of blocks with RUE and MOD A for reaching with RUE for shoulder flexion and demo cuing of using thumb and fingers to turn over block. Pt completes 2x10 towel glides with min A for achieving full ROM shoulder flex/ext, int/ext rotation, elbow flex/ext, circles in B directions. Pt completes reaching activity seated at high low table with A for facilitating reach across midline with RUE and pt able to grasp cone and stack on top of other cones for NMR. Exited session with pt seated in bed, exit alarm on and call light in reach  Therapy Documentation Precautions:  Precautions Precautions: Fall Precaution Comments: right hemiparesis Restrictions Weight Bearing Restrictions: No General:   Vital Signs:   Pain: Pain Assessment Faces Pain Scale: Hurts  little more Pain Type: Acute pain Pain Location: Ankle Pain Orientation: Right Pain Descriptors / Indicators: Grimacing Pain Onset: On-going Pain Intervention(s): RN made aware;Repositioned;Emotional support;Distraction ADL:   Vision   Perception    Praxis   Exercises:   Other Treatments:     Therapy/Group: Individual Therapy  Tonny Branch 08/06/2019, 2:37 PM

## 2019-08-07 ENCOUNTER — Inpatient Hospital Stay (HOSPITAL_COMMUNITY): Payer: Self-pay

## 2019-08-07 ENCOUNTER — Encounter (HOSPITAL_COMMUNITY): Payer: Self-pay | Admitting: Speech Pathology

## 2019-08-07 ENCOUNTER — Ambulatory Visit (HOSPITAL_COMMUNITY): Payer: Self-pay | Admitting: Physical Therapy

## 2019-08-07 ENCOUNTER — Encounter (HOSPITAL_COMMUNITY): Payer: Self-pay | Admitting: Occupational Therapy

## 2019-08-07 LAB — BASIC METABOLIC PANEL
Anion gap: 9 (ref 5–15)
BUN: 16 mg/dL (ref 6–20)
CO2: 24 mmol/L (ref 22–32)
Calcium: 9.3 mg/dL (ref 8.9–10.3)
Chloride: 103 mmol/L (ref 98–111)
Creatinine, Ser: 1.51 mg/dL — ABNORMAL HIGH (ref 0.61–1.24)
GFR calc Af Amer: >60 mL/min (ref >60)
GFR calc non Af Amer: 54 mL/min — ABNORMAL LOW (ref >60)
Glucose, Bld: 98 mg/dL (ref 70–99)
Potassium: 4.4 mmol/L (ref 3.5–5.1)
Sodium: 136 mmol/L (ref 135–145)

## 2019-08-07 MED ORDER — DICLOFENAC SODIUM 1 % TD GEL
2.0000 g | Freq: Four times a day (QID) | TRANSDERMAL | Status: DC
  Administered 2019-08-07 – 2019-08-12 (×20): 2 g via TOPICAL
  Filled 2019-08-07: qty 100

## 2019-08-07 NOTE — Progress Notes (Signed)
Zachary Mccann PHYSICAL MEDICINE & REHABILITATION PROGRESS NOTE  Subjective/Complaints:  Remains severely aphasic , points to R ankle   ROS: limited due to language/communication , Objective: Vital Signs: Blood pressure 133/83, pulse 82, temperature 97.8 F (36.6 C), resp. rate 18, height 6' (1.829 m), weight (!) 146.4 kg, SpO2 95 %. No results found. No results for input(s): WBC, HGB, HCT, PLT in the last 72 hours. No results for input(s): NA, K, CL, CO2, GLUCOSE, BUN, CREATININE, CALCIUM in the last 72 hours.  Physical Exam: BP 133/83 (BP Location: Right Arm)   Pulse 82   Temp 97.8 F (36.6 C)   Resp 18   Ht 6' (1.829 m)   Wt (!) 146.4 kg   SpO2 95%   BMI 43.77 kg/m  Constitutional: No distress . Vital signs reviewed. HEENT: EOMI, oral membranes moist Neck: supple Cardiovascular: RRR without murmur. No JVD    Respiratory: CTA Bilaterally without wheezes or rales. Normal effort    GI: BS +, non-tender, non-distended  Skin: Warm and dry.  Intact. Psych: Appears to be flat Musc: There is mild pain with passive ankle dorsiflexor patient also has clonus at the ankle.Pain over achilles   There is no pain with ankle inversion eversion no evidence of ankle swelling or erythema.  No foot swelling or erythema. Neurological:Alert Right facial weakness Nonverbal. Motor: LUE/LLE: Grossly 4/5 Right upper extremity: Shoulder abduction 2/5, elbow flexion/extension 2-/5, handgrip 1/5. Apraxic, stable Right lower extremity: 2-/5 hip/knee ext synergy  Assessment/Plan: 1. Functional deficits secondary to left brain infarct with history of CVA which require 3+ hours per day of interdisciplinary therapy in a comprehensive inpatient rehab setting.  Physiatrist is providing close team supervision and 24 hour management of active medical problems listed below.  Physiatrist and rehab team continue to assess barriers to discharge/monitor patient progress toward functional and medical goals  Care  Tool:  Bathing    Body parts bathed by patient: Chest, Right arm, Abdomen, Front perineal area, Right upper leg, Left upper leg, Right lower leg, Left lower leg, Face, Buttocks   Body parts bathed by helper: Left arm     Bathing assist Assist Level: Moderate Assistance - Patient 50 - 74%     Upper Body Dressing/Undressing Upper body dressing   What is the patient wearing?: Pull over shirt    Upper body assist Assist Level: Moderate Assistance - Patient 50 - 74%    Lower Body Dressing/Undressing Lower body dressing      What is the patient wearing?: Incontinence brief, Pants, Underwear/pull up     Lower body assist Assist for lower body dressing: Maximal Assistance - Patient 25 - 49%     Toileting Toileting    Toileting assist Assist for toileting: Maximal Assistance - Patient 25 - 49%     Transfers Chair/bed transfer  Transfers assist  Chair/bed transfer activity did not occur: Safety/medical concerns(Decreased strength/balance)  Chair/bed transfer assist level: Moderate Assistance - Patient 50 - 74%     Locomotion Ambulation   Ambulation assist   Ambulation activity did not occur: Safety/medical concerns(decreased strength/balance/endurance)  Assist level: Minimal Assistance - Patient > 75% Assistive device: Walker-rolling Max distance: 43ft   Walk 10 feet activity   Assist  Walk 10 feet activity did not occur: Safety/medical concerns(decreased strength/balance/endurance)  Assist level: Minimal Assistance - Patient > 75% Assistive device: Walker-rolling   Walk 50 feet activity   Assist Walk 50 feet with 2 turns activity did not occur: Safety/medical concerns  Assist level:  Moderate Assistance - Patient - 50 - 74% Assistive device: Walker-rolling    Walk 150 feet activity   Assist Walk 150 feet activity did not occur: Safety/medical concerns         Walk 10 feet on uneven surface  activity   Assist Walk 10 feet on uneven surfaces  activity did not occur: Safety/medical concerns         Wheelchair     Assist   Type of Wheelchair: Manual Wheelchair activity did not occur: Safety/medical concerns(decreased strength/balance/endurance)  Wheelchair assist level: Maximal Assistance - Patient 25 - 49% Max wheelchair distance: 39ft    Wheelchair 50 feet with 2 turns activity    Assist    Wheelchair 50 feet with 2 turns activity did not occur: Safety/medical concerns(decreased strength/balance/endurance)   Assist Level: Maximal Assistance - Patient 25 - 49%   Wheelchair 150 feet activity     Assist Wheelchair 150 feet activity did not occur: Safety/medical concerns(decreased strength/balance/endurance)          Medical Problem List and Plan: 1.Right side weakness, aphasia, dysphasiasecondary to left MCA and ACA scattered infarct due to left ICA occlusion as well as history of previous stroke in the past and medical noncompliance  Continue CIR, PT, OT SLP PRAFO/WHO for RLE and RUE 2. Antithrombotics: -DVT/anticoagulation:SCDs -antiplatelet therapy: plavix 75mg daily 3. Pain Management:Tylenol as needed 4. Mood:Provide emotional support -antipsychotic agents: N/A 5. Neuropsych: This patientappears to be capable of making decisions on hisown behalf. 6. Skin/Wound Care:Routine skin checks 7. Fluids/Electrolytes/Nutrition: check bmet tomorrow 8. Post stroke dysphagia.   Advanced to D1 nectars 8/7, poor oral intake of fluids 590 mL on 8/15  Aspiration precautions  Pt doesn't want NGT  -intake fair 25-70% meals   Advance diet as tolerated  -continue IVF 9. History of CAD with myocardial infarction status post stenting. Continue Plavix. Patient is followed by Dr. Carlyle Dolly 10. Hypertension. Coreg 3.125 mg twice daily.   Hydralazine 10 3 times daily started on 7/31, increased to 25 3 times daily on 8/3  Norvasc 10 mg daily   Vitals:   08/06/19  2007 08/07/19 0439  BP: 116/84 133/83  Pulse: 84 82  Resp: 16 18  Temp: 98.8 F (37.1 C) 97.8 F (36.6 C)  SpO2: 100% 95%  Controlled 8/21  No changes indicated today 11. Hyperlipidemia. Lipitor 12. History of tobacco as well as marijuana use. Counseling 13. Systolic CHF  Echo with EF of 40%  Chest x-ray personally reviewed, suggestive of cardiomegaly  Filed Weights   08/05/19 0449 08/06/19 0709 08/07/19 0649  Weight: (!) 148 kg (!) 151.2 kg (!) 146.4 kg   Lasix 20 daily DC'd on 8/7 due to limited p.o. intake, will consider restarting if appropriate  8/16 a bit up, monitor trend, I  600 cc yesterday  14.  CKD stage III  Will d/c IVF enc po recheck BMET on 8/24T Continue to monitor 15.  Morbid obesity: Encouraged weight loss 16. Sleep disturbance  Melatonin started on 8/4, increased on 8/6 DC'd on 8/7  Remeron 7.5 started on 8/7 17.  UTI completed macrobid  18.  Right ankle pain likely developing some contracture does have increased tone in the plantar flexors will order PR AFO Achilles tendinitis add voltaren gel  LOS: 21 days A FACE TO [place] E Kirsteins 08/07/2019, 7:57 AM

## 2019-08-07 NOTE — Plan of Care (Signed)
  Problem: RH Balance Goal: LTG Patient will maintain dynamic standing with ADLs (OT) Description: LTG:  Patient will maintain dynamic standing balance with assist during activities of daily living (OT)  Flowsheets (Taken 08/07/2019 1306) LTG: Pt will maintain dynamic standing balance during ADLs with: (Goal downgraded based on progress) Moderate Assistance - Patient 50 - 74%   Problem: RH Dressing Goal: LTG Patient will perform upper body dressing (OT) Description: LTG Patient will perform upper body dressing with assist, with/without cues (OT). Flowsheets (Taken 08/07/2019 1306) LTG: Pt will perform upper body dressing with assistance level of: (Goals downgraded based on progress.) Minimal Assistance - Patient > 75% Goal: LTG Patient will perform lower body dressing w/assist (OT) Description: LTG: Patient will perform lower body dressing with assist, with/without cues in positioning using equipment (OT) Flowsheets (Taken 08/07/2019 1306) LTG: Pt will perform lower body dressing with assistance level of: (Goal downgraded based on progress) Moderate Assistance - Patient 50 - 74%   Problem: RH Toileting Goal: LTG Patient will perform toileting task (3/3 steps) with assistance level (OT) Description: LTG: Patient will perform toileting task (3/3 steps) with assistance level (OT)  Flowsheets (Taken 08/07/2019 1306) LTG: Pt will perform toileting task (3/3 steps) with assistance level: (Goal downgraded based on progress) Moderate Assistance - Patient 50 - 74%   Problem: RH Functional Use of Upper Extremity Goal: LTG Patient will use RT/LT upper extremity as a (OT) Description: LTG: Patient will use right/left upper extremity as a stabilizer/gross assist/diminished/nondominant/dominant level with assist, with/without cues during functional activity (OT) Flowsheets (Taken 08/07/2019 1306) LTG: Use of upper extremity in functional activities: RUE as gross assist level LTG: Pt will use upper extremity  in functional activity with assistance level of: (Goal downgraded based on progress) Moderate Assistance - Patient 50 - 74%   Problem: RH Tub/Shower Transfers Goal: LTG Patient will perform tub/shower transfers w/assist (OT) Description: LTG: Patient will perform tub/shower transfers with assist, with/without cues using equipment (OT) Flowsheets (Taken 08/07/2019 1306) LTG: Pt will perform tub/shower stall transfers with assistance level of: (Goal downgraded based on progress) Moderate Assistance - Patient 50 - 74% LTG: Pt will perform tub/shower transfers from: Tub/shower combination

## 2019-08-07 NOTE — Progress Notes (Signed)
Occupational Therapy Weekly Progress Note  Patient Details  Name: Zachary Mccann MRN: [id number] Date of Birth: [birth year]  Beginning of progress report period: August 01, 2019 End of progress report period: August 07, 2019  Today's Date: 08/07/2019 OT Individual Time: 2330-0762 OT Individual Time Calculation (min): 49 min    Patient has met 1 of 4 short term goals.  Pt is making slow but steady progress with OT at this time.  He continues to need min to mod assist for sit to stand variably.  Stand pivot transfers to the walk-in shower or the wide 3:1 are at a mod assist overall secondary to RLE weakness and balance.  He continues to need mod assist for donning pullover shirt with mod to max assist for all LB dressing.  RUE functional use is at a Brunnstrum stage IV level in the arm and hand with stage III in the shoulder.  He continues to demonstrate expressive deficits, relying on thumbs up and thumbs down for communication as well as pointing to things.  Feel he will likely need greater assist at home than previously thought.  Will downgrade therapy LTGs to min to mod.  Have begun family education with his spouse and will continue with this until expected discharge on 8/26.  Recommend continued OT at CIR level.   Patient continues to demonstrate the following deficits: muscle weakness, impaired timing and sequencing, abnormal tone, unbalanced muscle activation, decreased coordination and decreased motor planning, decreased attention to right and decreased motor planning, decreased attention, decreased awareness, decreased problem solving, decreased safety awareness and decreased memory and decreased sitting balance, decreased standing balance, decreased postural control, hemiplegia and decreased balance strategies and therefore will continue to benefit from skilled OT intervention to enhance overall performance with BADL and Reduce care partner burden.  Patient not progressing toward long term  goals.  See goal revision..  Continue plan of care.  OT Short Term Goals Week 4:  OT Short Term Goal 1 (Week 4): Continue working on downgraded long term min to mod assist goals.  Skilled Therapeutic Interventions/Progress Updates:    Pt worked on toilet transfer and toileiting to start session with his spouse present for education.  He was able to transfer to the EOB with mod assist and then complete functional mobility to the 3:1 over the toilet with use of the RW for support and mod assist.  He needed max assist for clothing management and toilet hygiene sit to stand.  Educated spouse on use of the gait belt for safety but she was not able to assist with toileting tasks this session secondary to decreased time.  Pt transferred out to the wheelchair after completion with mod assist with use of the RW.  Dressing was completed with some education on hemi dressing techniques, however pt was assisted more secondary to decreased time.  Pt's spouse assisted with sit to stand which was min assist and mod assist for pulling pants over his hips.  Mod assist was also needed for donning pullover shirt.  Pt was left in the wheelchair with spouse present in preparation for next session with PT.  She has agreed to come back on 8/24 for further education.    Therapy Documentation Precautions:  Precautions Precautions: Fall Precaution Comments: right hemiparesis Restrictions Weight Bearing Restrictions: No   Pain: Pain Assessment Pain Scale: Faces Pain Score: 0-No pain ADL: See Care Tool Section for some details of ADLs  Therapy/Group: Individual Therapy  Lynett Brasil OTR/L 08/07/2019, 1:16 PM

## 2019-08-07 NOTE — Progress Notes (Signed)
Physical Therapy Session Note  Patient Details  Name: Zachary Mccann MRN: [id number] Date of Birth: 23-Jan-1971  Today's Date: 08/07/2019 PT Individual Time: 1125-1210 PT Individual Time Calculation (min): 45 min   Short Term Goals: Week 3:  PT Short Term Goal 1 (Week 3): STG=LTG due to ELOS  Skilled Therapeutic Interventions/Progress Updates:    Pt received sitting in w/c with his fiance present for family education/training and pt agreeable to therapy session. [name] reported that she has been unable to have a ramp built due to a wood shortage and she has therefore ordered a ramp off of [place] but it will not be delivered until after pt's current expected D/C date - ramp is 1ft long and 36inches wide. [name] also reports there is 1 step-up in the home from where the patient will enter to where the living areas are and she has purchased a 2nd smaller, ramp for that step (also 36inches wide). [name] reports she has moved pt's bed into the living room and pt declines hospital bed therefore discussed purchasing bedrails if needed. Pt is reporting R ankle/foot pain, which limits and impacts pt's mobility during session resulting in him requiring increased assist for mobility - RN notified for medication administration. Transported to/from car room in w/c. [name] reports she has a 12 passenger [place]ian[place] Lei that is very high. Educated her on performing a stand/few step transfer w/c>car pending on ability to fit w/c and RW close to the vehicle. Pt performed short distance ambulation w/c>car transfer using RW with therapist providing min progressed to mod assist for balance and pt required max assist for stepping RLE back once close to vehicle - max assist for R LE management in/out of vehicle - mod assist for sit>stand and stand pivot back to w/c using RW with manual facilitation/assist for R LE stepping. After performing car transfer discussed with pt and his fiance and decided that this is not  safe. Discussed the above barriers with pt, fiance, and Education officer, [name] and decided on current need for ambulance transport home. Pt transported back to room in w/c. Performed sit<>stand w/c<>RW with pt's fiance providing mod assist for lifting and balance due to pt demonstrating heavy R lateral lean and therapist providing max cuing for proper set-up/sequencing and providing CGA for safety. Therapist educated pt/fiance/social worker that additional family education is necessary and discussed fiance returning on Monday 08/10/2019. Pt left sitting in w/c with needs in reach, seat belt alarm on, R foot/ankle compression wrapped with ice and elevated on leg rest. RN notified to remove ice in 20 minutes and in agreement.  Therapy Documentation Precautions:  Precautions Precautions: Fall Precaution Comments: right hemiparesis Restrictions Weight Bearing Restrictions: No  Pain: Pt continues to report significant R ankle/foot pain limiting his weight bearing activity tolerance during session - provided ice, compression, and elevation as well as notified RN for medication administration.    Therapy/Group: Individual Therapy  Tawana Scale, PT, DPT 08/07/2019, 7:58 AM

## 2019-08-07 NOTE — Progress Notes (Signed)
Social Work Patient ID: Zachary Mccann, male   DOB: 1971/04/14, 48 y.o.   MRN: [id number]  Met with [name]-fiance who is here to begin learning pt's care in preparation for discharge next Wed. She will come back on Monday to continue education. She felt it went well and will come back Monday. She has [place] a ramp via [place] but it will not be here until Thursday. Pt wants to go home via ambulance to home on Wed.

## 2019-08-07 NOTE — Plan of Care (Signed)
  Problem: RH Car Transfers Goal: LTG Patient will perform car transfers with assist (PT) Description: LTG: Patient will perform car transfers with assistance (PT). Outcome: Not Applicable Flowsheets (Taken 08/07/2019 1906) LTG: Pt will perform car transfers with assist:: (discontinued as pt planning on ambulance transport home due to inaccessible vehicle) -- Note: discontinued as pt planning on ambulance transport home due to inaccessible vehicle   Problem: RH Wheelchair Mobility Goal: LTG Patient will propel w/c in controlled environment (PT) Description: LTG: Patient will propel wheelchair in controlled environment, # of feet with assist (PT) Flowsheets Taken 08/07/2019 1906 by Tawana Scale, PT LTG: Pt will propel w/c in controlled environ  assist needed:: (downgraded based on slow progress) Minimal Assistance - Patient > 75% Taken 07/18/2019 1758 by Eustace Pen, Cherie L, PT LTG: Propel w/c distance in controlled environment: 50' Note: downgraded based on slow progress   Problem: RH Wheelchair Mobility Goal: LTG Patient will propel w/c in home environment (PT) Description: LTG: Patient will propel wheelchair in home environment, # of feet with assistance (PT). Flowsheets Taken 08/07/2019 1906 by Tawana Scale, PT LTG: Pt will propel w/c in home environ  assist needed:: (downgraded based on slow progress) Minimal Assistance - Patient > 75% Taken 07/18/2019 1758 by Eustace Pen, Cherie L, PT LTG: Propel w/c distance in home environment: 28' Note: downgraded based on slow progress   Problem: RH Stairs Goal: LTG Patient will ambulate up and down stairs w/assist (PT) Description: LTG: Patient will ambulate up and down # of stairs with assistance (PT) Outcome: Not Applicable Flowsheets (Taken 08/07/2019 1906) LTG: Pt will ambulate up/down stairs assist needed:: (discontinued as pt planning on ambulance transport home and plan for ramp entrance) -- Note: discontinued as pt planning on  ambulance transport home and plan for ramp entrance

## 2019-08-07 NOTE — Progress Notes (Signed)
Physical Therapy Session Note  Patient Details  Name: Zachary Mccann MRN: [id number] Date of Birth: 02/20/1971  Today's Date: 08/07/2019 PT Individual Time: 1432-1510 PT Individual Time Calculation (min): 38 min   Short Term Goals: Week 3:  PT Short Term Goal 1 (Week 3): STG=LTG due to ELOS  Skilled Therapeutic Interventions/Progress Updates:    Focused on w/c propulsion with hemi technique with mod assist needed physically and verbal cues for technique for steering and use of L foot x 75' with poor awareness when running into obstacles on R. [employer] from seated position for functional reaching and attention task with increased reaction time for R quadrants especially R lower quadrant x 2 trials. In seated position in w/c engaged in reciprocal movement pattern retraining with BLE for propulsion with focus on activation and attention to RLE with cues for sequencing and attention. Upon return to room, pt wanting to return to bed. Attempted squat pivot but pt tendency to try to stand and then with posterior lean unsafe and required +2 to complete safely to the L. Pt flung himself back on the bed and required assist with RLE onto bed and cues for repositioning. All needs in reach.  Therapy Documentation Precautions:  Precautions Precautions: Fall Precaution Comments: right hemiparesis Restrictions Weight Bearing Restrictions: No Pain:  pt gesturing and using thumbs up/down to communicate pain in R ankle. Declines any standing activities.    Therapy/Group: Individual Therapy  [name], PT, DPT, CBIS  08/07/2019, 3:36 PM

## 2019-08-07 NOTE — Progress Notes (Signed)
Speech Language Pathology Daily Session Note  Patient Details  Name: Zachary Mccann MRN: [id number] Date of Birth: 06/06/1971  Today's Date: 08/07/2019 SLP Individual Time: 5188-4166 SLP Individual Time Calculation (min): 58 min  Short Term Goals: Week 3: SLP Short Term Goal 1 (Week 3): Pt will consume dys 1 textures and nectar thick liquids with min cues for use of swallowing precautions and minimal overt s/s of aspiration. SLP Short Term Goal 2 (Week 3): Patient will consume trials of thin liquids with minimal overt s/sx of aspiration and min cues for use of swallowing precautions over 3 consecutive sessions prior to repeat instrumental assessment. SLP Short Term Goal 3 (Week 3): Pt will produce vocalizations in coordination with movement of articulators in 50% of opportunities with max assist multimodal cues. SLP Short Term Goal 4 (Week 3): Pt will produce non speech oral motor movements on command as a precursor to speech tasks  in >50% of opportunities with mod assist multimodal cues.  Skilled Therapeutic Interventions: Pt was seen for skilled ST services focused on family education with pt's wife. SLP reviewed treatment techniques and activities for pt's communication utilized during previous sessions, as well as description of apraxia and suggestions for how she can help to facilitate verbalizations upon pt's return home. SLP also familiarized pt's wife with pt's communication notebook and importance of multimodal means for communicating wants and needs/socializing/etc. at this time. SLP educated pt's wife regarding pt's current diet (dysphagia 1 and nectar thick liquids) and aspiration risk. Explanation and handouts provided for solid PO recommendations and how to thicken drinks to correct consistency, as well as optimal postioning for PO intake and safe swallow strategies. Pt's wife confirmed she already has thickener at home. SLP also emphasized importance of follow up ST to continue to work  toward dysphagia and communication goals; pt and wife both in agreement they would like to pursue home health [place]. All questions regarding pt's diet, swallow function, and communication were answered to her satisfaction. SLP encouraged pt's wife to reach out to ST via phone or follow up educational sessions prior to discharge to address any additional concerns that may arise. Pt was left in bed with bed alarm set, call bell within reach, and wife still present at bedside. Continue per current plan of care.       Pain Pain Assessment Pain Scale: Faces Pain Score: 0-No pain Faces Pain Scale: No hurt  Therapy/Group: Individual Therapy  Arbutus Leas 08/07/2019, 10:49 AM

## 2019-08-08 NOTE — Progress Notes (Signed)
Zachary Mccann is a 48 y.o. male May 19, 1971 [id number]  Subjective: Patient using left hand thumbs up and thumbs down to communicate, unchanged.  In such a manner, patient denies having pain.  Indicates healing well.  Denies shortness of breath or other problem.  Objective: Vital signs in last 24 hours: Temp:  [98.2 F (36.8 C)-98.4 F (36.9 C)] 98.2 F (36.8 C) (08/22 0334) Pulse Rate:  [82-86] 85 (08/22 0334) Resp:  [14-20] 14 (08/22 0334) BP: (116-130)/(76-84) 130/84 (08/22 0334) SpO2:  [97 %-99 %] 99 % (08/22 0334) Weight change:  Last BM Date: 08/05/19  Intake/Output from previous day: 08/21 0701 - 08/22 0700 In: 840 [P.O.:840] Out: 1000 [Urine:1000]  Physical Exam General: No apparent distress   sitting supine in bed recliner.  Aphasic.  Obese. Lungs: Normal effort. Lungs clear to auscultation, no crackles or wheezes. Cardiovascular: Regular rate and rhythm, no edema Neurological: No new neurological deficits.  Persisting right hemi-paresis with aphasia unchanged  Lab Results: BMET    Component Value Date/Time   NA 136 08/07/2019 0749   K 4.4 08/07/2019 0749   CL 103 08/07/2019 0749   CO2 24 08/07/2019 0749   GLUCOSE 98 08/07/2019 0749   BUN 16 08/07/2019 0749   CREATININE 1.51 (H) 08/07/2019 0749   CALCIUM 9.3 08/07/2019 0749   GFRNONAA 54 (L) 08/07/2019 0749   GFRAA >60 08/07/2019 0749   CBC    Component Value Date/Time   WBC 10.8 (H) 07/28/2019 0918   RBC 4.34 07/28/2019 0918   HGB 13.7 07/28/2019 0918   HCT 42.3 07/28/2019 0918   PLT 258 07/28/2019 0918   MCV 97.5 07/28/2019 0918   MCH 31.6 07/28/2019 0918   MCHC 32.4 07/28/2019 0918   RDW 11.6 07/28/2019 0918   LYMPHSABS 1.8 07/28/2019 0918   MONOABS 0.8 07/28/2019 0918   EOSABS 0.2 07/28/2019 0918   BASOSABS 0.1 07/28/2019 0918   CBG's (last 3):  No results for input(s): GLUCAP in the last 72 hours. LFT's Lab Results  Component Value Date   ALT 10 07/20/2019   AST 14 (L) 07/20/2019   ALKPHOS 67 07/20/2019   BILITOT 0.7 07/20/2019    Studies/Results: No results found.  Medications:  I have reviewed the patient's current medications. Scheduled Medications: . amLODipine  10 mg Oral Daily  . atorvastatin  40 mg Oral q1800  . carvedilol  6.25 mg Oral BID WC  . clopidogrel  75 mg Oral Daily  . diclofenac sodium  2 g Topical QID  . feeding supplement (ENSURE ENLIVE)  237 mL Oral TID BM  . hydrALAZINE  25 mg Oral TID  . Melatonin  3 mg Oral QHS  . mirtazapine  7.5 mg Oral QHS  . multivitamin with minerals  1 tablet Oral Daily  . nitrofurantoin (macrocrystal-monohydrate)  100 mg Oral Q12H  . 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  Left brain stroke affecting MCA and ACA distribution with subsequent right-sided weakness and aphasia.  Continue ongoing rehab with PT, OT and speech-language path as ordered. 2.  Hypertension, uncontrolled prior to admission.  Now with reasonable control post stroke in hospital setting.  Continue medications as currently prescribed and titrate as needed. 3.  Hypertensive cardiomyopathy with recent systolic heart failure and history of CAD with PTCA.  Appears compensated at present.  Continue medical management as ongoing.  I's and O's monitored during rehab stay. 4.  Post stroke dysphasia.   Appreciate speech therapy support with recommendations on advancing diet as indicated.  Continue aspiration precautions 5.  Dyslipidemia.  Continue statin. 6.  Chronic kidney disease stage III.  Creatinine stable.  Continue to monitor electrolytes, fluid balance and creatinine as ongoing  Delcie Ruppert A. Asa Lente, MD 08/08/2019, 10:50 AM

## 2019-08-09 ENCOUNTER — Inpatient Hospital Stay (HOSPITAL_COMMUNITY): Payer: Self-pay

## 2019-08-09 ENCOUNTER — Inpatient Hospital Stay (HOSPITAL_COMMUNITY): Payer: Self-pay | Admitting: Speech Pathology

## 2019-08-09 NOTE — Progress Notes (Signed)
Speech Language Pathology Daily Session Note  Patient Details  Name: Zachary Mccann MRN: [id number] Date of Birth: September 04, 1971  Today's Date: 08/09/2019 SLP Individual Time: 0803-0900 SLP Individual Time Calculation (min): 57 min  Short Term Goals: Week 3: SLP Short Term Goal 1 (Week 3): Pt will consume dys 1 textures and nectar thick liquids with min cues for use of swallowing precautions and minimal overt s/s of aspiration. SLP Short Term Goal 2 (Week 3): Patient will consume trials of thin liquids with minimal overt s/sx of aspiration and min cues for use of swallowing precautions over 3 consecutive sessions prior to repeat instrumental assessment. SLP Short Term Goal 3 (Week 3): Pt will produce vocalizations in coordination with movement of articulators in 50% of opportunities with max assist multimodal cues. SLP Short Term Goal 4 (Week 3): Pt will produce non speech oral motor movements on command as a precursor to speech tasks  in >50% of opportunities with mod assist multimodal cues.  Skilled Therapeutic Interventions:  Pt was seen for skilled ST targeting goals for communication and dysphagia.  Pt was in bed upon arrival, awake, alert, and agreeable to participating in therapy.  SLP facilitated the session with non speech oral motor movements to elicit volitional articulation of targeted phonemes as well as voicing.  Pt could replicate mouth opening and closing, smiled x1, and weakly approximated blowing a kiss with max assist multimodal cues.  Voicing (gutteral, grunting) was elicited x2 while pushing up from the bed with max assist multimodal cues.  SLP also utilized videos of people producing /ah/ and /m/ to continue to demonstrate accurate production of targets.  Pt consumed trials of thin liquids via cup sips, ice chips, and dys 2 textures.  Pt demonstrated x1 delayed, soft cough out of 5 trials of ice chips.  No overt s/s of aspiration were evident with cup sips of liquids and his  manipulation and mastication of advanced solids was slightly prolonged but functional for clearing the oral cavity.  Recommend ongoing trial of advanced textures with pt's primary SLP.  Pt was left in bed with bed alarm set and call bell within reach.  Continue per current plan of care.    Pain Pain Assessment Pain Scale: 0-10 Pain Score: 6  Faces Pain Scale: Hurts a little bit Pain Type: Acute pain Pain Location: Ankle Pain Orientation: Right Pain Descriptors / Indicators: Discomfort Pain Onset: On-going Pain Intervention(s): RN made aware  Therapy/Group: Individual Therapy  Zachary Mccann, Zachary Mccann 08/09/2019, 12:23 PM

## 2019-08-09 NOTE — Progress Notes (Signed)
Zachary Mccann is a 48 y.o. male [birth year] [id number]  Subjective: Patient using left hand thumbs up and thumbs down to communicate, unchanged.  In such a manner, patient denies having pain.  Indicates feeling well but did not sleep well.  Denies shortness of breath or other problem.  Objective: Vital signs in last 24 hours: Temp:  [98.5 F (36.9 C)-99 F (37.2 C)] 98.5 F (36.9 C) (08/23 0422) Pulse Rate:  [83-88] 88 (08/23 0422) Resp:  [16-17] 16 (08/23 0422) BP: (116-139)/(77-93) 139/93 (08/23 0422) SpO2:  [93 %-98 %] 93 % (08/23 0422) Weight change:  Last BM Date: 08/05/19  Intake/Output from previous day: 08/22 0701 - 08/23 0700 In: 720 [P.O.:720] Out: 1150 [Urine:1150]  Physical Exam General: No apparent distress   sitting supine in bed recliner.  Aphasic.  Obese. Lungs: Normal effort. Lungs clear to auscultation, no crackles or wheezes. Cardiovascular: Regular rate and rhythm, no edema Neurological: No new neurological deficits.  Persisting right hemi-paresis with aphasia unchanged  Lab Results: BMET    Component Value Date/Time   NA 136 08/07/2019 0749   K 4.4 08/07/2019 0749   CL 103 08/07/2019 0749   CO2 24 08/07/2019 0749   GLUCOSE 98 08/07/2019 0749   BUN 16 08/07/2019 0749   CREATININE 1.51 (H) 08/07/2019 0749   CALCIUM 9.3 08/07/2019 0749   GFRNONAA 54 (L) 08/07/2019 0749   GFRAA >60 08/07/2019 0749   CBC    Component Value Date/Time   WBC 10.8 (H) 07/28/2019 0918   RBC 4.34 07/28/2019 0918   HGB 13.7 07/28/2019 0918   HCT 42.3 07/28/2019 0918   PLT 258 07/28/2019 0918   MCV 97.5 07/28/2019 0918   MCH 31.6 07/28/2019 0918   MCHC 32.4 07/28/2019 0918   RDW 11.6 07/28/2019 0918   LYMPHSABS 1.8 07/28/2019 0918   MONOABS 0.8 07/28/2019 0918   EOSABS 0.2 07/28/2019 0918   BASOSABS 0.1 07/28/2019 0918   CBG's (last 3):  No results for input(s): GLUCAP in the last 72 hours. LFT's Lab Results  Component Value Date   ALT 10 07/20/2019   AST 14  (L) 07/20/2019   ALKPHOS 67 07/20/2019   BILITOT 0.7 07/20/2019    Studies/Results: No results found.  Medications:  I have reviewed the patient's current medications. Scheduled Medications: . amLODipine  10 mg Oral Daily  . atorvastatin  40 mg Oral q1800  . carvedilol  6.25 mg Oral BID WC  . clopidogrel  75 mg Oral Daily  . diclofenac sodium  2 g Topical QID  . feeding supplement (ENSURE ENLIVE)  237 mL Oral TID BM  . hydrALAZINE  25 mg Oral TID  . Melatonin  3 mg Oral QHS  . mirtazapine  7.5 mg Oral QHS  . multivitamin with minerals  1 tablet Oral Daily  . nitrofurantoin (macrocrystal-monohydrate)  100 mg Oral Q12H  . 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  Left brain stroke affecting MCA and ACA distribution with subsequent right-sided weakness and aphasia.  Continue ongoing rehab with PT, OT and speech-language path as ordered. 2.  Hypertension, uncontrolled prior to admission.  Now with reasonable control post stroke in hospital setting.  Continue medications as currently prescribed and titrate as needed. 3.  Hypertensive cardiomyopathy with recent systolic heart failure and history of CAD with PTCA.  Appears compensated at present.  Continue medical management as ongoing.  I's and O's monitored during rehab  stay. 4.  Post stroke dysphasia.  Appreciate speech therapy support with recommendations on advancing diet as indicated.  Continue aspiration precautions 5.  Dyslipidemia.  Continue statin. 6.  Chronic kidney disease stage III.  Creatinine stable.  Continue to monitor electrolytes, fluid balance and creatinine as ongoing   A. Felicity Coyer, MD 08/09/2019, 9:57 AM

## 2019-08-09 NOTE — Progress Notes (Signed)
Physical Therapy Session Note  Patient Details  Name: Zachary Mccann MRN: [id number] Date of Birth: January 21, 1971  Today's Date: 08/09/2019 PT Individual Time: 1101-1157 PT Individual Time Calculation (min): 56 min   Short Term Goals: Week 3:  PT Short Term Goal 1 (Week 3): STG=LTG due to ELOS  Skilled Therapeutic Interventions/Progress Updates:    Pt supine in bed upon PT arrival, agreeable to therapy tx with a thumbs up, pt reports R ankle pain, ace wrap applied for compression and pain relief. Pt transferred from supine>sitting EOB with min assist and use of bedrails, cues for techniques. Pt reports having to use the bathroom, points to the bathroom and gives thumbs up when asked. Therapist set up stedy for transfer, therapist asked pt to stand however pt would not. He then pointed to his head and then asked for his notepad. Pt wrote down that he feels dizzy. Therapist checked pt's blood pressure while sitting EOB in L arm- 123/79. Pt transported to the bathroom with stedy, total assist. Pt performed sit<>stands within stedy min assist, total assist for clothing/brief management. Prior to transfer therapist offered pt the urinal, pt declined. While transferring to the toilet pt incontinent of bladder, and urinated on the floor. Pt transferred onto toilet, continent of bowel this session. Therapist assisted pt to wash body and assisted with pericare. Pt transferred to w/c with stedy, dependent. Donned briefs, socks and gown, all with mod assist, sit<>stand min assist to pull over hips. Pt declined walking this session secondary to R ankle pain. Performed 2 x 10 R LE LAQ and x 10 active assisted hip flexion. Pt left in w/c with needs in reach and chair alarm set.   Therapy Documentation Precautions:  Precautions Precautions: Fall Precaution Comments: right hemiparesis Restrictions Weight Bearing Restrictions: No    Therapy/Group: Individual Therapy  Netta Corrigan, PT, DPT 08/09/2019,  7:01 AM

## 2019-08-09 NOTE — Progress Notes (Signed)
Patient noted with small boyle on right buttocks and small skin tear on right side of hip while getting personal care. While being cleaned the boyle bust. Once cleaned no extra drainage. Will continue to monitor and access areas.

## 2019-08-10 ENCOUNTER — Inpatient Hospital Stay (HOSPITAL_COMMUNITY): Payer: Medicaid Other

## 2019-08-10 ENCOUNTER — Encounter (HOSPITAL_COMMUNITY): Payer: Self-pay | Admitting: Speech Pathology

## 2019-08-10 ENCOUNTER — Encounter (HOSPITAL_COMMUNITY): Payer: Self-pay | Admitting: Occupational Therapy

## 2019-08-10 ENCOUNTER — Ambulatory Visit (HOSPITAL_COMMUNITY): Payer: Self-pay

## 2019-08-10 NOTE — Progress Notes (Signed)
Orthopedic Tech Progress Note Patient Details:  Zachary Mccann May 30, 1971 [id number] Called [facility] for AFO. Patient ID: Zachary Mccann, male   DOB: 1971/05/28, 48 y.o.   MRN: [id number]   Melony Overly T 08/10/2019, 9:59 AM

## 2019-08-10 NOTE — Progress Notes (Signed)
Modified Barium Swallow Progress Note  Patient Details  Name: Zachary Mccann MRN: [id number] Date of Birth: 08-Oct-1971  Today's Date: 08/10/2019  Modified Barium Swallow completed.  Full report located under Chart Review in the Imaging Section.  Brief recommendations include the following:  Clinical Impression   Pt presents with excellent gains in swallow function, now with a mild-moderate oral dysphagia and normal pharyngeal function.  He demonstrated improved labial seal with only trace anterior spillage of thin liquids; ongoing deficits with oral control and propulsion, but now with improved mastication and more success with manipulating soft solids.  He was unable to drink from a straw, but consumed thin liquids from the edge of a cup with functional control. A 13 mm barium pill was manually removed due to inability to propel into pharynx.  Once POs passed the marker of the ramus, the pharyngeal swallow was initiated with normal speed and strength, with reliable laryngeal vestibular closure.  There was occasional transient penetration (PAS score of 2, considered WNL); there was no aspiration.  Pharyngeal squeeze was effective and there was no residue post-swallow.  Pt is ready to advance to a dysphagia 2 diet with thin liquids; continue to crush meds in puree or give in liquid form.  Pt will benefit from ongoing SLP services to address oral dysphagia.    Swallow Evaluation Recommendations       SLP Diet Recommendations: Dysphagia 2 (Fine chop) solids;Thin liquid   Liquid Administration via: Cup   Medication Administration: Crushed with puree   Supervision: Patient able to self feed;Staff to assist with self feeding   Compensations: Small sips/bites;Monitor for anterior loss       Oral Care Recommendations: Oral care BID      Zachary Mccann, [place] Office number [phone] Pager [phone]   [name] 08/10/2019,2:33  PM

## 2019-08-10 NOTE — Progress Notes (Signed)
Physical Therapy Session Note  Patient Details  Name: Zachary Mccann MRN: [id number] Date of Birth: June 15, 1971  Today's Date: 08/10/2019 PT Individual Time: 0917-1030 PT Individual Time Calculation (min): 73 min   Short Term Goals: Week 3:  PT Short Term Goal 1 (Week 3): STG=LTG due to ELOS  Skilled Therapeutic Interventions/Progress Updates:    Pt supine in bed upon PT arrival, agreeable to therapy tx with a thumbs up and reports pain in R ankle (points to ankle, unable to rate). Pt begins pointing to the window, therapist asks pt to write in his notebook for communication. Pt writes "Why can't I go home today?" therapist explains that we need to continue family education and therapy to ensure pt is safe to go home for set d/c on Wednesday. Pt noted to be incontinent of bladder in brief. Pt performed rolling to the R with min assist and rolling to the L with mod assist x 2 in each direction in order to change briefs and don shorts, pt given washcloth to perform pericare with set up assist. Therapist donned B shoes total assist for time management and donned R toe up brace in shoe to trial for gait/transfers. Pt's significant other present now for family education this session. Therapist educated her on purpose and use of toe up brace and also addition of toe cap for foot clearance, explained hoe to don/doff. Therapist educated pt's significant other on how to perform bed mobility, pt performed supine>sitting EOB from flat bed without rails to simulate home set up, assist from significant other with therapist providing cues. Pt performed x 2 transfers this session in each direction with RW and min assist from his significant other from bed<>w/c, therapist providing education on guarding techniques and cues for transfer. Pt continues to point to R ankle, pt's significant other asking about and xray, therapist consulted PA-C (dan). Therapist also educated pt and his significant other that post-stroke can  lead to hypersensitivity and pain in the affected limb which may be a factor contributing to pain. Discussed home set up, pt's significant other reports that she forgot to measure the doorway, will measure today and call social worker to report so that we can ensure w/c will fit in the house. Throughout session pt continued to point to door/window, wanting to leave today. Therapist and pt's significant other continued to educate and explain why pt is not ready. Pt left seated in w/c at end of session with needs in reach and in care of his significant other.   Therapy Documentation Precautions:  Precautions Precautions: Fall Precaution Comments: right hemiparesis Restrictions Weight Bearing Restrictions: No    Therapy/Group: Individual Therapy  Netta Corrigan, PT, DPT 08/10/2019, 7:08 AM

## 2019-08-10 NOTE — Progress Notes (Signed)
Speech Language Pathology Daily Session Note  Patient Details  Name: Zachary Mccann MRN: [id number] Date of Birth: 07/13/1971  Today's Date: 08/10/2019 SLP Individual Time: 1320-1355 SLP Individual Time Calculation (min): 35 min  Short Term Goals: Week 3: SLP Short Term Goal 1 (Week 3): Pt will consume dys 1 textures and nectar thick liquids with min cues for use of swallowing precautions and minimal overt s/s of aspiration. SLP Short Term Goal 2 (Week 3): Patient will consume trials of thin liquids with minimal overt s/sx of aspiration and min cues for use of swallowing precautions over 3 consecutive sessions prior to repeat instrumental assessment. SLP Short Term Goal 3 (Week 3): Pt will produce vocalizations in coordination with movement of articulators in 50% of opportunities with max assist multimodal cues. SLP Short Term Goal 4 (Week 3): Pt will produce non speech oral motor movements on command as a precursor to speech tasks  in >50% of opportunities with mod assist multimodal cues.  Skilled Therapeutic Interventions: Pt was seen for skilled ST services focused on dysphagia goals and education. Pt was received sitting upright in his wheelchair upon entrance to his room. SLP facilitated session with review and explanation of pt's change in oropharyngeal swallow function that was evidenced during instrumental MBS just prior to session (pt was upgraded to dysphagia 2, thin liquids, continue meds crushed in puree). Pt was receptive to education regarding improvements in his swallow function and difference between old and new diet recommendations. SLP emphasized the importance of pt's continued compliance with swallow precautions (small bites/sips, slow rate, upright positioning) both for remainder of his hospitalization and upon his return home. SLP also facilitated session with skilled observation of pt's consumption of upgraded thin liquids. No overt s/s aspiration were observed throughout 25+  trials, and pt utilized swallow precautions with Supervision verbal cues. SLP to continue follow up education with pt and his wife tomorrow. Continue per current plan of care.       Pain Pain Assessment Pain Scale: Faces Pain Score: 0-No pain  Therapy/Group: Individual Therapy  Arbutus Leas 08/10/2019, 2:24 PM

## 2019-08-10 NOTE — Progress Notes (Signed)
[place] PHYSICAL MEDICINE & REHABILITATION PROGRESS NOTE  Subjective/Complaints:  Remains severely aphasic , with hand gestures, says he's OK, slept well, and has no issues at all.  Per PT, needs a R foot up AFO ordered for him to help with mild-moderate DF weakness.   ROS: limited due to language/communication , Objective: Vital Signs: Blood pressure 112/71, pulse 91, temperature 98.5 F (36.9 C), resp. rate 16, height 6' (1.829 m), weight (!) 141.4 kg, SpO2 97 %. No results found. No results for input(s): WBC, HGB, HCT, PLT in the last 72 hours. No results for input(s): NA, K, CL, CO2, GLUCOSE, BUN, CREATININE, CALCIUM in the last 72 hours.  Physical Exam: BP 112/71 (BP Location: Left Arm)   Pulse 91   Temp 98.5 F (36.9 C)   Resp 16   Ht 6' (1.829 m)   Wt (!) 141.4 kg   SpO2 97%   BMI 42.28 kg/m  Constitutional: vitals reviewed Sitting up in bed, in room, alone, appears comfortable, NAD HEENT: EOMI, oral membranes moist Cardiovascular: RRR without murmur. No JVD    Respiratory: CTA Bilaterally without wheezes or rales. Normal effort    GI: BS +, non-tender, non-distended  Skin: Warm and dry.  Intact. Psych: Appears to be flat affect, no smiles Musc: There is mild pain with passive ankle dorsiflexor patient also has clonus at the ankle.Pain over achilles   There is no pain with ankle inversion eversion no evidence of ankle swelling or erythema.  No foot swelling or erythema. Neurological:Alert Right facial weakness Nonverbal. Motor: LUE/LLE: Grossly 4/5 Right upper extremity: Shoulder abduction 2/5, elbow flexion/extension 2-/5, handgrip 1/5. Apraxic, stable Right lower extremity: 2-/5 hip/knee ext synergy  Assessment/Plan: 1. Functional deficits secondary to left brain infarct with history of CVA which require 3+ hours per day of interdisciplinary therapy in a comprehensive inpatient rehab setting.  Physiatrist is providing close team supervision and 24 hour  management of active medical problems listed below.  Physiatrist and rehab team continue to assess barriers to discharge/monitor patient progress toward functional and medical goals  Care Tool:  Bathing    Body parts bathed by patient: Chest, Right arm, Abdomen, Front perineal area, Right upper leg, Left upper leg, Right lower leg, Left lower leg, Face, Buttocks   Body parts bathed by helper: Left arm     Bathing assist Assist Level: Moderate Assistance - Patient 50 - 74%     Upper Body Dressing/Undressing Upper body dressing   What is the patient wearing?: Pull over shirt    Upper body assist Assist Level: Moderate Assistance - Patient 50 - 74%    Lower Body Dressing/Undressing Lower body dressing      What is the patient wearing?: Incontinence brief, Pants, Underwear/pull up     Lower body assist Assist for lower body dressing: Maximal Assistance - Patient 25 - 49%     Toileting Toileting    Toileting assist Assist for toileting: Maximal Assistance - Patient 25 - 49%     Transfers Chair/bed transfer  Transfers assist  Chair/bed transfer activity did not occur: Safety/medical concerns(Decreased strength/balance)  Chair/bed transfer assist level: Dependent - mechanical lift     Locomotion Ambulation   Ambulation assist   Ambulation activity did not occur: Safety/medical concerns(decreased strength/balance/endurance)  Assist level: Moderate Assistance - Patient 50 - 74% Assistive device: Walker-rolling Max distance: 10'   Walk 10 feet activity   Assist  Walk 10 feet activity did not occur: Safety/medical concerns(decreased strength/balance/endurance)  Assist level:  Minimal Assistance - Patient > 75% Assistive device: Walker-rolling   Walk 50 feet activity   Assist Walk 50 feet with 2 turns activity did not occur: Safety/medical concerns  Assist level: Moderate Assistance - Patient - 50 - 74% Assistive device: Walker-rolling    Walk 150 feet  activity   Assist Walk 150 feet activity did not occur: Safety/medical concerns         Walk 10 feet on uneven surface  activity   Assist Walk 10 feet on uneven surfaces activity did not occur: Safety/medical concerns         Wheelchair     Assist   Type of Wheelchair: Manual Wheelchair activity did not occur: Safety/medical concerns(decreased strength/balance/endurance)  Wheelchair assist level: Moderate Assistance - Patient 50 - 74% Max wheelchair distance: 47'    Wheelchair 50 feet with 2 turns activity    Assist    Wheelchair 50 feet with 2 turns activity did not occur: Safety/medical concerns(decreased strength/balance/endurance)   Assist Level: Moderate Assistance - Patient 50 - 74%   Wheelchair 150 feet activity     Assist Wheelchair 150 feet activity did not occur: Safety/medical concerns(decreased strength/balance/endurance)          Medical Problem List and Plan: 1.Right side weakness, aphasia, dysphasiasecondary to left MCA and ACA scattered infarct due to left ICA occlusion as well as history of previous stroke in the past and medical noncompliance  Continue CIR, PT, OT SLP PRAFO/WHO for RLE and RUE 2. Antithrombotics: -DVT/anticoagulation:SCDs -antiplatelet therapy: plavix 75mg daily 3. Pain Management:Tylenol as needed 4. Mood:Provide emotional support -antipsychotic agents: N/A 5. Neuropsych: This patientappears to be capable of making decisions on hisown behalf. 6. Skin/Wound Care:Routine skin checks 7. Fluids/Electrolytes/Nutrition: check bmet tomorrow 8. Post stroke dysphagia.   Advanced to D1 nectars 8/7, poor oral intake of fluids 590 mL on 8/15  Aspiration precautions  Pt doesn't want NGT  -intake fair 25-70% meals   Advance diet as tolerated  -continue IVF  8/24- no IVFs- last BUN 16 and Cr 1.51- will recheck BMP 9. History of CAD with myocardial infarction status post stenting.  Continue Plavix. Patient is followed by Dr. Dina Rich 10. Hypertension. Coreg 3.125 mg twice daily.   Hydralazine 10 3 times daily started on 7/31, increased to 25 3 times daily on 8/3  Norvasc 10 mg daily   Vitals:   08/09/19 1928 08/10/19 0642  BP: 124/77 112/71  Pulse: 81 91  Resp: 16 16  Temp: 98.6 F (37 C) 98.5 F (36.9 C)  SpO2: 98% 97%  Controlled 8/21  No changes indicated today 11. Hyperlipidemia. Lipitor 12. History of tobacco as well as marijuana use. Counseling 13. Systolic CHF  Echo with EF of 36%  Chest x-ray personally reviewed, suggestive of cardiomegaly  Filed Weights   08/06/19 0709 08/07/19 0649 08/10/19 0641  Weight: (!) 151.2 kg (!) 146.4 kg (!) 141.4 kg   Lasix 20 daily DC'd on 8/7 due to limited p.o. intake, will consider restarting if appropriate  8/16 a bit up, monitor trend, I  600 cc yesterday  14.  CKD stage III  Will d/c IVF enc po recheck BMET on 8/24T Continue to monitor  8/24- will recheck BMP 15.  Morbid obesity: Encouraged weight loss 16. Sleep disturbance  Melatonin started on 8/4, increased on 8/6 DC'd on 8/7  Remeron 7.5 started on 8/7 17.  UTI completed macrobid  18.  Right ankle pain likely developing some contracture does have increased tone in the plantar  flexors will order PR AFO Achilles tendinitis add voltaren gel   8/24- ordered R foot up AFO for pt. LOS: 24 days A FACE TO FACE EVALUATION WAS PERFORMED  Carita Sollars 08/10/2019, 10:34 AM

## 2019-08-10 NOTE — Progress Notes (Signed)
Occupational Therapy Session Note  Patient Details  Name: Zachary Mccann MRN: [id number] Date of Birth: 1971/09/21  Today's Date: 08/10/2019 OT Individual Time: 8101-7510 OT Individual Time Calculation (min): 73 min    Short Term Goals: Week 4:  OT Short Term Goal 1 (Week 4): Continue working on downgraded long term min to mod assist goals.  Skilled Therapeutic Interventions/Progress Updates:    Pt's spouse present for family education session.  Pt completed stand pivot transfer to the tub bench in the walk-in shower with mod assist, without use of the RW.  He was able to complete bathing with overall min assist.  Wash mit and LH sponge utilized for washing as well with mod assist for RUE use to wash the left arm.  Min assist for sit to stand with mod assist for washing buttocks.  He transferred back to the wheelchair at [facility] assist level for dressing sit to stand at the sink.  Reacher utilized with mod instructional cueing and min assist for threading shorts over his LEs.  Mod assist for sit to stand in order to pull them up over his hips, with spouse assisting with this task.  Provided step stool for donning gripper sock on the left foot which he completed with supervision.  Crossed the RLE over the left knee with max assist to donn the right gripper sock.  Min assist for donning pullover shirt with mod instructional cueing to begin with the right side first and then to do the left.  Pt's spouse then assisted him with a stand pivot transfer to and from the toilet with mod assist and use of the RW.  She will need more practice and plans to come back tomorrow as well.  Pt left in the wheelchair with call button and phone in reach and pt's spouse present for supervision.   Therapy Documentation Precautions:  Precautions Precautions: Fall Precaution Comments: right hemiparesis Restrictions Weight Bearing Restrictions: No   Pain: Pain Assessment Pain Scale: Faces Pain Score: 0-No pain ADL:  See Care Tool Section for some details of ADL  Therapy/Group: Individual Therapy  Celeste Candelas OTR/L 08/10/2019, 12:30 PM

## 2019-08-11 ENCOUNTER — Ambulatory Visit (HOSPITAL_COMMUNITY): Payer: Self-pay | Admitting: Physical Therapy

## 2019-08-11 ENCOUNTER — Inpatient Hospital Stay (HOSPITAL_COMMUNITY): Payer: Self-pay | Admitting: Occupational Therapy

## 2019-08-11 ENCOUNTER — Inpatient Hospital Stay (HOSPITAL_COMMUNITY): Payer: Self-pay | Admitting: Physical Therapy

## 2019-08-11 ENCOUNTER — Inpatient Hospital Stay (HOSPITAL_COMMUNITY): Payer: Self-pay | Admitting: Speech Pathology

## 2019-08-11 DIAGNOSIS — D72829 Elevated white blood cell count, unspecified: Secondary | ICD-10-CM

## 2019-08-11 LAB — CBC
HCT: 38 % — ABNORMAL LOW (ref 39.0–52.0)
Hemoglobin: 12.6 g/dL — ABNORMAL LOW (ref 13.0–17.0)
MCH: 31.7 pg (ref 26.0–34.0)
MCHC: 33.2 g/dL (ref 30.0–36.0)
MCV: 95.5 fL (ref 80.0–100.0)
Platelets: 251 10*3/uL (ref 150–400)
RBC: 3.98 MIL/uL — ABNORMAL LOW (ref 4.22–5.81)
RDW: 11.7 % (ref 11.5–15.5)
WBC: 7.8 10*3/uL (ref 4.0–10.5)
nRBC: 0 % (ref 0.0–0.2)

## 2019-08-11 LAB — BASIC METABOLIC PANEL
Anion gap: 12 (ref 5–15)
BUN: 20 mg/dL (ref 6–20)
CO2: 22 mmol/L (ref 22–32)
Calcium: 9.2 mg/dL (ref 8.9–10.3)
Chloride: 101 mmol/L (ref 98–111)
Creatinine, Ser: 1.76 mg/dL — ABNORMAL HIGH (ref 0.61–1.24)
GFR calc Af Amer: 52 mL/min — ABNORMAL LOW (ref >60)
GFR calc non Af Amer: 45 mL/min — ABNORMAL LOW (ref >60)
Glucose, Bld: 92 mg/dL (ref 70–99)
Potassium: 4.6 mmol/L (ref 3.5–5.1)
Sodium: 135 mmol/L (ref 135–145)

## 2019-08-11 MED ORDER — HYDRALAZINE HCL 25 MG PO TABS: 25.0000 mg | ORAL_TABLET | Freq: Three times a day (TID) | ORAL | 0 refills | Status: DC

## 2019-08-11 MED ORDER — CLOPIDOGREL BISULFATE 75 MG PO TABS: 75.0000 mg | ORAL_TABLET | Freq: Every day | ORAL | 0 refills | Status: DC

## 2019-08-11 MED ORDER — AMLODIPINE BESYLATE 10 MG PO TABS: 10.0000 mg | ORAL_TABLET | Freq: Every day | ORAL | 0 refills | Status: DC

## 2019-08-11 MED ORDER — MELATONIN 3 MG PO TABS: 3.0000 mg | ORAL_TABLET | Freq: Every day | ORAL | 0 refills | Status: DC

## 2019-08-11 MED ORDER — DICLOFENAC SODIUM 1 % TD GEL: 2.0000 g | Freq: Four times a day (QID) | TRANSDERMAL | 0 refills | Status: DC

## 2019-08-11 MED ORDER — CARVEDILOL 6.25 MG PO TABS: 6.2500 mg | ORAL_TABLET | Freq: Two times a day (BID) | ORAL | 0 refills | Status: DC

## 2019-08-11 MED ORDER — ACETAMINOPHEN 325 MG PO TABS: 650.0000 mg | ORAL_TABLET | ORAL | Status: DC | PRN

## 2019-08-11 MED ORDER — MIRTAZAPINE 7.5 MG PO TABS: 7.5000 mg | ORAL_TABLET | Freq: Every day | ORAL | 0 refills | Status: DC

## 2019-08-11 MED ORDER — ENSURE ENLIVE PO LIQD: 237.0000 mL | Freq: Two times a day (BID) | ORAL | Status: DC

## 2019-08-11 MED ORDER — SENNOSIDES-DOCUSATE SODIUM 8.6-50 MG PO TABS: 2.0000 | ORAL_TABLET | Freq: Every day | ORAL | Status: DC

## 2019-08-11 MED ORDER — ADULT MULTIVITAMIN W/MINERALS CH: 1.0000 | ORAL_TABLET | Freq: Every day | ORAL | 0 refills | Status: DC

## 2019-08-11 MED ORDER — ATORVASTATIN CALCIUM 40 MG PO TABS: 40.0000 mg | ORAL_TABLET | Freq: Every day | ORAL | 0 refills | Status: DC

## 2019-08-11 NOTE — Plan of Care (Signed)
  Problem: Consults Goal: RH STROKE PATIENT EDUCATION Description: See Patient Education module for education specifics  Outcome: Progressing   Problem: RH BOWEL ELIMINATION Goal: RH STG MANAGE BOWEL WITH ASSISTANCE Description: STG Manage Bowel with [place]. Outcome: Progressing   Problem: RH BLADDER ELIMINATION Goal: RH STG MANAGE BLADDER WITH ASSISTANCE Description: STG Manage Bladder With Min Assistance Outcome: Progressing   Problem: RH COGNITION-NURSING Goal: RH STG ANTICIPATES NEEDS/CALLS FOR ASSIST W/ASSIST/CUES Description: STG Anticipates Needs/Calls for Assist With supervision Assistance/Cues. Outcome: Progressing   Problem: RH KNOWLEDGE DEFICIT Goal: RH STG INCREASE KNOWLEDGE OF STROKE PROPHYLAXIS Description: Pt will demonstrate increased knowledge of stroke prevention including diet management, medication regimen, and follow up care with primary care physician at the time of discharge with min assist/cues.  Outcome: Progressing

## 2019-08-11 NOTE — Discharge Instructions (Signed)
Inpatient Rehab Discharge Instructions  Zachary Mccann Discharge date and time: No discharge date for patient encounter.   Activities/Precautions/ Functional Status: Activity: activity as tolerated Diet:  Wound Care: none needed Functional status:  ___ No restrictions     ___ Walk up steps independently ___ 24/7 supervision/assistance   ___ Walk up steps with assistance ___ Intermittent supervision/assistance  ___ Bathe/dress independently ___ Walk with walker     _x__ Bathe/dress with assistance ___ Walk Independently    ___ Shower independently ___ Walk with assistance    ___ Shower with assistance ___ No alcohol     ___ Return to work/school ________  Special Instructions: No smoking driving or alcohol   COMMUNITY REFERRALS UPON DISCHARGE:    Home Health:   PT, OT, SP     [place] [phone]   Date of last service:08/12/2019  Medical Equipment/Items Ordered:WHEELCHAIR, WIDE ROLLING WALKER, WIDE BEDSIDE COMMODE  Agency/Supplier:[facility]   [phone]  [place] UP WITH GETTING [place] INTO HAS ALL PAPERWORK MATCH GIVEN FOR ASSISTANCE WITH PRESCRIPTIONS APPLIED FOR DISABILITY AND MEDICAID  STROKE/TIA DISCHARGE INSTRUCTIONS SMOKING Cigarette smoking nearly doubles your risk of having a stroke & is the single most alterable risk factor  If you smoke or have smoked in the last 12 months, you are advised to quit smoking for your health.  Most of the excess cardiovascular risk related to smoking disappears within a year of stopping.  Ask you doctor about anti-smoking medications  [place] Quit Line: 1-800-QUIT NOW  Free Smoking Cessation Classes [phone]  CHOLESTEROL Know your levels; limit fat & cholesterol in your diet  Lipid Panel     Component Value Date/Time   CHOL 141 07/11/2019 0732   TRIG 117 07/11/2019 0732   HDL 28 (L) 07/11/2019 0732   CHOLHDL 5.0 07/11/2019 0732   VLDL 23 07/11/2019 0732     [place] 90 07/11/2019 0732      Many patients benefit from treatment even if their cholesterol is at goal.  Goal: Total Cholesterol (CHOL) less than 160  Goal:  Triglycerides (TRIG) less than 150  Goal:  HDL greater than 40  Goal:  LDL (LDLCALC) less than 100   BLOOD PRESSURE American Stroke Association blood pressure target is less that 120/80 mm/Hg  Your discharge blood pressure is:  BP: (!) 151/85  Monitor your blood pressure  Limit your salt and alcohol intake  Many individuals will require more than one medication for high blood pressure  DIABETES (A1c is a blood sugar average for last 3 months) Goal HGBA1c is under 7% (HBGA1c is blood sugar average for last 3 months)  Diabetes: No known diagnosis of diabetes    Lab Results  Component Value Date   HGBA1C 5.6 07/11/2019     Your HGBA1c can be lowered with medications, healthy diet, and exercise.  Check your blood sugar as directed by your physician  Call your physician if you experience unexplained or low blood sugars.  PHYSICAL ACTIVITY/REHABILITATION Goal is 30 minutes at least 4 days per week  Activity: Increase activity slowly, Therapies: Physical Therapy: Home Health Return to work:   Activity decreases your risk of heart attack and stroke and makes your heart stronger.  It helps control your weight and blood pressure; helps you relax and can improve your mood.  Participate in a regular exercise program.  Talk with your doctor about the best form of exercise for you (dancing, walking, swimming, cycling).  DIET/WEIGHT Goal  is to maintain a healthy weight  Your discharge diet is:  Diet Order            DIET - DYS 1 Room service appropriate? Yes; Fluid consistency: Nectar Thick  Diet effective now              liquids Your height is:  Height: 6' (182.9 cm) Your current weight is: Weight: (!) 148.9 kg Your Body Mass Index (BMI) is:  BMI (Calculated): 44.51  Following the type of diet specifically  designed for you will help prevent another stroke.  Your goal weight range is:    Your goal Body Mass Index (BMI) is 19-24.  Healthy food habits can help reduce 3 risk factors for stroke:  High cholesterol, hypertension, and excess weight.  RESOURCES Stroke/Support Group:  Call [phone]   STROKE EDUCATION PROVIDED/REVIEWED AND GIVEN TO PATIENT Stroke warning signs and symptoms How to activate emergency medical system (call 911). Medications prescribed at discharge. Need for follow-up after discharge. Personal risk factors for stroke. Pneumonia vaccine given:  Flu vaccine given:  My questions have been answered, the writing is legible, and I understand these instructions.  I will adhere to these goals & educational materials that have been provided to me after my discharge from the hospital.      My questions have been answered and I understand these instructions. I will adhere to these goals and the provided educational materials after my discharge from the hospital.  Patient/Caregiver Signature _______________________________ Date __________  Clinician Signature _______________________________________ Date __________  Please bring this form and your medication list with you to all your follow-up doctor's appointments.

## 2019-08-11 NOTE — Progress Notes (Signed)
Social Work Patient ID: Zachary Mccann, male   DOB: [birth year], 48 y.o.   MRN: [id number]  Fiance could not come in due to a child issue. She has ben to several sessions and learned pt's care. She feels prepared for pt to come home tomorrow. Home health set up via [facility] and his equipment should be delivered tomorrow. Prepare pt for discharge tomorrow.

## 2019-08-11 NOTE — Progress Notes (Addendum)
[place] PHYSICAL MEDICINE & REHABILITATION PROGRESS NOTE  Subjective/Complaints: Patient seen lying in bed this morning.  He indicates that he slept well overnight.  Improving.  ROS: limited due to language  Objective: Vital Signs: Blood pressure 117/76, pulse 89, temperature 98.3 F (36.8 C), resp. rate 16, height 6' (1.829 m), weight (!) 141.4 kg, SpO2 96 %. Dg Ankle Complete Right  Result Date: 08/10/2019 CLINICAL DATA:  Right ankle pain.  No injury EXAM: RIGHT ANKLE - COMPLETE 3+ VIEW COMPARISON:  None. FINDINGS: There is no evidence of fracture, dislocation, or joint effusion. There is no evidence of arthropathy or other focal bone abnormality. Soft tissues are unremarkable. IMPRESSION: Negative. Electronically Signed   By: Charlett Nose M.D.   On: 08/10/2019 19:10   Dg Swallowing Func-speech Pathology  Result Date: 08/10/2019 Objective Swallowing Evaluation: Type of Study: MBS-Modified Barium Swallow Study  Patient Details Name: NAKHI HITSMAN MRN: [id number] Date of Birth: 10-16-1971 Today's Date: 08/10/2019 Time: SLP Start Time (ACUTE ONLY): 1250 -SLP Stop Time (ACUTE ONLY): 1320 SLP Time Calculation (min) (ACUTE ONLY): 30 min Past Medical History: Past Medical History: Diagnosis Date . CAD (coronary artery disease)   a. s/p NSTEMI in 04/2018 with DES to LCx.  [name] Hypertension  . Myocardial infarction (HCC)  . Pneumonia  . Stroke [facility])  Past Surgical History: Past Surgical History: Procedure Laterality Date . CORONARY STENT INTERVENTION N/A 05/05/2018  Procedure: CORONARY STENT INTERVENTION;  Surgeon: Marykay Lex, MD;  Location: [facility] INVASIVE CV LAB;  Service: Cardiovascular;  Laterality: N/A; . LEFT HEART CATH AND CORONARY ANGIOGRAPHY N/A 05/05/2018  Procedure: LEFT HEART CATH AND CORONARY ANGIOGRAPHY;  Surgeon: Marykay Lex, MD;  Location: [facility] INVASIVE CV LAB;  Service: Cardiovascular;  Laterality: N/A; . TEE WITHOUT CARDIOVERSION N/A 09/08/2018  Procedure: TRANSESOPHAGEAL ECHOCARDIOGRAM  (TEE);  Surgeon: Laurey Morale, MD;  Location: [facility] ENDOSCOPY;  Service: Cardiovascular;  Laterality: N/A; HPI: Patient is a 48 y/o male presenting to the ED on 07/10/2019 with primary complaints of slurred speech, R facial droop. MRI extensive left hemisphere acute/subacute white matter infarcts Extensive left hemisphere acute/subacute white matter infarcts involving the left centrum semi ovale and corona radiata. Scattered punctate foci of cortical infarct involving the anterior left frontal lobe and left parietal lobe. Occluded left internal carotid artery with reconstitution of the level of the left posterior communicating artery.  Past medical history significant for CVA, hypertension, CKD 3, coronary artery disease.  Prior MBS on 7/26 revealed a primary severe oral dysphagia with "significant deficits in oral control and sequencing of motor movements, as well as sensory loss on right side.  Pt unable to coordinate use of a straw; boluses were offered from cup edge and spoon.  Liquid barium tends to spill immediately from right side of mouth.  There is limited lingual movement, requiring pt to extend neck in order to allow gravity to help propel material into pharynx.  Liquids tend to move passively into throat; purees are moved with weak tongue pumping.  However, once material reaches sub-vallecular space, pt achieves effective laryngeal vestibule closure and transitions POs through UES with no residue post-swallow.  Due to deficits in timing, there were two occasions when thin liquids entered the larynx before the onset of the swallow, leading to trace aspiration with a delayed cough response."   A full liquid diet thickened to nectar was initiated; pt was advanced to dysphagia 1/nectars.  Subjective: alert, cooperative Assessment / Plan / Recommendation CHL IP CLINICAL IMPRESSIONS 08/10/2019 Clinical  Impression  Pt presents with excellent gains in swallow function, now with a mild-moderate oral dysphagia and  normal pharyngeal function.  He demonstrated improved labial seal with only trace anterior spillage of thin liquids; ongoing deficits with oral control and propulsion, but now with improved mastication and more success with manipulating soft solids.  He was unable to drink from a straw, but consumed thin liquids from the edge of a cup with functional control. A 13 mm barium pill was manually removed due to inability to propel into pharynx.  Once POs passed the marker of the ramus, the pharyngeal swallow was initiated with normal speed and strength, with reliable laryngeal vestibular closure.  There was occasional transient penetration (PAS score of 2, considered WNL); there was no aspiration.  Pharyngeal squeeze was effective and there was no residue post-swallow.  Pt is ready to advance to a dysphagia 2 diet with thin liquids; continue to crush meds in puree or give in liquid form.  Pt will benefit from ongoing SLP services to address oral dysphagia.  SLP Visit Diagnosis Dysphagia, oral phase (R13.11) Attention and concentration deficit following -- Frontal lobe and executive function deficit following -- Impact on safety and function No limitations   CHL IP TREATMENT RECOMMENDATION 08/10/2019 Treatment Recommendations Therapy as outlined in treatment plan below   Prognosis 07/14/2019 Prognosis for Safe Diet Advancement Good Barriers to Reach Goals Severity of deficits Barriers/Prognosis Comment -- CHL IP DIET RECOMMENDATION 08/10/2019 SLP Diet Recommendations Dysphagia 2 (Fine chop) solids;Thin liquid Liquid Administration via Cup Medication Administration Crushed with puree Compensations Small sips/bites;Monitor for anterior loss Postural Changes --   CHL IP OTHER RECOMMENDATIONS 08/10/2019 Recommended Consults -- Oral Care Recommendations Oral care BID Other Recommendations --   CHL IP FOLLOW UP RECOMMENDATIONS 07/16/2019 Follow up Recommendations Inpatient Rehab   CHL IP FREQUENCY AND DURATION 07/14/2019 Speech  Therapy Frequency (ACUTE ONLY) min 3x week Treatment Duration --      CHL IP ORAL PHASE 08/10/2019 Oral Phase Impaired Oral - Pudding Teaspoon -- Oral - Pudding Cup -- Oral - Honey Teaspoon -- Oral - Honey Cup -- Oral - Nectar Teaspoon -- Oral - Nectar Cup NT Oral - Nectar Straw -- Oral - Thin Teaspoon -- Oral - Thin Cup Right anterior bolus loss;Reduced posterior propulsion;Delayed oral transit;Decreased bolus cohesion Oral - Thin Straw -- Oral - Puree Reduced posterior propulsion;Delayed oral transit;Decreased bolus cohesion Oral - Mech Soft Reduced posterior propulsion;Delayed oral transit;Decreased bolus cohesion Oral - Regular -- Oral - Multi-Consistency -- Oral - Pill -- Oral Phase - Comment --  CHL IP PHARYNGEAL PHASE 08/10/2019 Pharyngeal Phase WFL Pharyngeal- Pudding Teaspoon -- Pharyngeal -- Pharyngeal- Pudding Cup -- Pharyngeal -- Pharyngeal- Honey Teaspoon -- Pharyngeal -- Pharyngeal- Honey Cup -- Pharyngeal -- Pharyngeal- Nectar Teaspoon -- Pharyngeal -- Pharyngeal- Nectar Cup NT Pharyngeal -- Pharyngeal- Nectar Straw -- Pharyngeal -- Pharyngeal- Thin Teaspoon -- Pharyngeal -- Pharyngeal- Thin Cup WFL Pharyngeal -- Pharyngeal- Thin Straw -- Pharyngeal -- Pharyngeal- Puree WFL Pharyngeal -- Pharyngeal- Mechanical Soft -- Pharyngeal -- Pharyngeal- Regular -- Pharyngeal -- Pharyngeal- Multi-consistency -- Pharyngeal -- Pharyngeal- Pill -- Pharyngeal -- Pharyngeal Comment --  No flowsheet data found. Blenda MountsCouture, Amanda Laurice 08/10/2019, 2:34 PM              No results for input(s): WBC, HGB, HCT, PLT in the last 72 hours. No results for input(s): NA, K, CL, CO2, GLUCOSE, BUN, CREATININE, CALCIUM in the last 72 hours.  Physical Exam: BP 117/76 (BP Location: Left Arm)  Pulse 89   Temp 98.3 F (36.8 C)   Resp 16   Ht 6' (1.829 m)   Wt (!) 141.4 kg   SpO2 96%   BMI 42.28 kg/m   Constitutional: No distress . Vital signs reviewed. HENT: Normocephalic.  Atraumatic. Eyes: EOMI. No  discharge. Cardiovascular: No JVD. Respiratory: Normal effort.  No stridor. GI: Non-distended. Skin: Warm and dry.  Intact. Psych: Flat Musc: Mild LE edema.  No tenderness. Neurological:Alert Right facial weakness Nonverbal. Motor: LUE/LLE: Grossly 4+/5 Right upper extremity: Shoulder abduction 2/5, elbow flexion/extension 2-/5, handgrip 3-/5. Apraxic, stable Right lower extremity: 3/5 proximal to distal  Assessment/Plan: 1. Functional deficits secondary to left brain infarct with history of CVA which require 3+ hours per day of interdisciplinary therapy in a comprehensive inpatient rehab setting.  Physiatrist is providing close team supervision and 24 hour management of active medical problems listed below.  Physiatrist and rehab team continue to assess barriers to discharge/monitor patient progress toward functional and medical goals  Care Tool:  Bathing    Body parts bathed by patient: Chest, Right arm, Abdomen, Front perineal area, Right upper leg, Left upper leg, Right lower leg, Left lower leg, Face, Buttocks   Body parts bathed by helper: Left arm     Bathing assist Assist Level: Moderate Assistance - Patient 50 - 74%     Upper Body Dressing/Undressing Upper body dressing   What is the patient wearing?: Pull over shirt    Upper body assist Assist Level: Minimal Assistance - Patient > 75%    Lower Body Dressing/Undressing Lower body dressing      What is the patient wearing?: Incontinence brief, Pants, Underwear/pull up     Lower body assist Assist for lower body dressing: Moderate Assistance - Patient 50 - 74%     Toileting Toileting    Toileting assist Assist for toileting: Maximal Assistance - Patient 25 - 49%     Transfers Chair/bed transfer  Transfers assist  Chair/bed transfer activity did not occur: Safety/medical concerns(Decreased strength/balance)  Chair/bed transfer assist level: Minimal Assistance - Patient > 75%      Locomotion Ambulation   Ambulation assist   Ambulation activity did not occur: Safety/medical concerns(decreased strength/balance/endurance)  Assist level: Moderate Assistance - Patient 50 - 74% Assistive device: Walker-rolling Max distance: 10'   Walk 10 feet activity   Assist  Walk 10 feet activity did not occur: Safety/medical concerns(decreased strength/balance/endurance)  Assist level: Minimal Assistance - Patient > 75% Assistive device: Walker-rolling   Walk 50 feet activity   Assist Walk 50 feet with 2 turns activity did not occur: Safety/medical concerns  Assist level: Moderate Assistance - Patient - 50 - 74% Assistive device: Walker-rolling    Walk 150 feet activity   Assist Walk 150 feet activity did not occur: Safety/medical concerns         Walk 10 feet on uneven surface  activity   Assist Walk 10 feet on uneven surfaces activity did not occur: Safety/medical concerns         Wheelchair     Assist   Type of Wheelchair: Manual Wheelchair activity did not occur: Safety/medical concerns(decreased strength/balance/endurance)  Wheelchair assist level: Moderate Assistance - Patient 50 - 74% Max wheelchair distance: 5575'    Wheelchair 50 feet with 2 turns activity    Assist    Wheelchair 50 feet with 2 turns activity did not occur: Safety/medical concerns(decreased strength/balance/endurance)   Assist Level: Moderate Assistance - Patient 50 - 74%   Wheelchair 150  feet activity     Assist Wheelchair 150 feet activity did not occur: Safety/medical concerns(decreased strength/balance/endurance)          Medical Problem List and Plan: 1.Right side weakness, aphasia, dysphasiasecondary to left MCA and ACA scattered infarct due to left ICA occlusion as well as history of previous stroke in the past and medical noncompliance  Cont CIR, PT, OT SLP  PRAFO/WHO for RLE and RUE 2.  Antithrombotics: -DVT/anticoagulation:SCDs -antiplatelet therapy: plavix 75mg daily 3. Pain Management:Tylenol as needed 4. Mood:Provide emotional support -antipsychotic agents: N/A 5. Neuropsych: This patientappears to be capable of making decisions on hisown behalf. 6. Skin/Wound Care:Routine skin checks 7. Fluids/Electrolytes/Nutrition: check bmet tomorrow 8. Post stroke dysphagia.   Advanced to D2 thins  Aspiration precautions  Pt doesn't want NGT  Advance diet as tolerated 9. History of CAD with myocardial infarction status post stenting. Continue Plavix. Patient is followed by Dr. Carlyle Dolly 10. Hypertension. Coreg 3.125 mg twice daily.   Hydralazine 10 3 times daily started on 7/31, increased to 25 3 times daily on 8/3  Norvasc 10 mg daily   Vitals:   08/10/19 2021 08/11/19 0458  BP: 108/67 117/76  Pulse: 80 89  Resp: 16 16  Temp: 97.9 F (36.6 C) 98.3 F (36.8 C)  SpO2:  96%   Controlled on 8/25 11. Hyperlipidemia. Lipitor 12. History of tobacco as well as marijuana use. Counseling 13. Systolic CHF  Echo with EF of 40%  Chest x-ray personally reviewed, suggestive of cardiomegaly  Filed Weights   08/06/19 0709 08/07/19 0649 08/10/19 0641  Weight: (!) 151.2 kg (!) 146.4 kg (!) 141.4 kg   Lasix 20 daily DC'd on 8/7 due to limited p.o. intake, will consider restarting if appropriate  ?improving on 8/25 14.  CKD stage III  Cr 1.51 on 8/21  Labs ordered  15.  Morbid obesity: Encouraged weight loss 16. Sleep disturbance  Melatonin started on 8/4, increased on 8/6 DC'd on 8/7  Remeron 7.5 started on 8/7  Stable on 8/25 17.  UTI completed macrobid  18.  Right ankle pain likely developing some contracture does have increased tone in the plantar flexors will order PR AFO Achilles tendinitis added voltaren gel   8/24- ordered R foot up AFO for pt.  Right ankle xray personally reviewed, unremarkable 19.  Leukocytosis  WBCs 10.8 on 8/11  Labs ordered    LOS: 25 days A FACE TO FACE EVALUATION WAS PERFORMED   Lorie Phenix 08/11/2019, 9:06 AM

## 2019-08-11 NOTE — Progress Notes (Signed)
Speech Language Pathology Discharge Summary  Patient Details  Name: Zachary Mccann MRN: [id number] Date of Birth: [birth year]  Today's Date: 08/11/2019 SLP Individual Time: 9242-6834 SLP Individual Time Calculation (min): 56 min   Skilled Therapeutic Interventions:  Pt was seen for skilled ST targeting speech and dysphagia goals as well as further pt/family education. SLP facilitated session with skilled observation of pt consuming dysphagia 2 and thin liquid POs. No overt s/s aspiration noted throughout intake. Pt demonstrated efficient mastication and oral clearance of solid POs with Min A verbal cues to utilize swallow strategies. Per pt and RN report, pt did not experience any overt difficulty consuming breakfast or lunch today. Pt utilized written expression to communicate both basic and complex ideas throughout session with Mod I. Pt initiated discussion regarding discharge and expressed excitement to return home to his family. SLP contacted pt's wife via phone to provide her (and pt) with further education regarding recent changes in upgraded dysphagia 2/thin liquid diet. SLP summarized results of pt's improved oropharyngeal swallow function, evidenced on pt's instrumental MBS yesterday. Pt's wife verbalized understanding and asked appropriate questions about acceptable food items to remain in compliance with new recommendations. Handout was left in pt's healthcare notebook binder in room - to be sent home with pt. All questions were answered to pt and wife's satisfaction. With RN assistance, pt was transferred back to bed, left with bed alarm set and all needs within reach. Continue per current plan of care.    Patient has met 3 of 3 long term goals.  Patient to discharge at overall Supervision level.  Reasons goals not met:     Clinical Impression/Discharge Summary:   Pt made functional gains and met 3 out of 3 long term goals this admission. Pt currently requires Mod I assist for use of  written communication strategies and Min A for use of safe swallow strategies. Recommend 24/7 supervision for dysphagia upon discharge. Pt is consuming dysphagia 2 diet with thin liquids. Pt has demonstrated improved oropharyngeal swallow function and use of compensatory swallow strategies, as well as initiation/ease of use of written expression to communicate. However, given mild-mod oral dysphagia and severe apraxia of speech still present, recommend pt continue to receive skilled ST services upon discharge. Pt and family education is complete at this time.   Care Partner:  Caregiver Able to Provide Assistance: Yes  Type of Caregiver Assistance: Physical  Recommendation:  Home Health SLP;24 hour supervision/assistance  Rationale for SLP Follow Up: Maximize functional communication;Maximize swallowing safety;Reduce caregiver burden   Equipment: none   Reasons for discharge: Discharged from hospital   Patient/Family Agrees with Progress Made and Goals Achieved: Yes    Arbutus Leas 08/11/2019, 3:37 PM

## 2019-08-11 NOTE — Progress Notes (Signed)
Social Work Discharge Note   The overall goal for the admission was met for:   Discharge location: [place]  Length of Stay: Yes-27 DAYS  Discharge activity level: Yes-MIN LEVEL OF ASSIST  Home/community participation: Yes  Services provided included: MD, RD, PT, OT, SLP, RN, CM, TR, Pharmacy and SW  Financial Services: Other: PENDING MEDICAID  Follow-up services arranged: Home Health: [facility]-PT,OT,SP, DME: [facility]-WHEEL[facility], [facility] and Patient/Family has no preference for HH/DME agencies  Comments (or additional information):[name] HAS COME IN FOR SEVERAL SESSIONS AND FEELS PREPARED FOR PT TO COME HOME TODAY. MATCH GIVEN AND [facility] [place] GIVEN TO PT TO FOLLOW UP WITH. SSD AND MEDICAID APPLICATIONS STILL PENDING.  Patient/Family verbalized understanding of follow-up arrangements: Yes  Individual responsible for coordination of the follow-up plan: [name]-FIANCE  Confirmed correct DME delivered: Elease Hashimoto 08/11/2019    Elease Hashimoto

## 2019-08-11 NOTE — Progress Notes (Signed)
Occupational Therapy Discharge Summary  Patient Details  Name: Zachary Mccann MRN: [id number] Date of Birth: 17-Aug-1971  Today's Date: 08/11/2019 OT Individual Time: 1110-1205 OT Individual Time Calculation (min): 55 min   Session Note:  Pt up in the wheelchair to start session.  Therapist took him down to the tub/shower room where he practiced tub transfers with use of the RW and tub bench.  Mod assist to complete transfer on and off of the bench with mod instructional cueing for technique.  Pt still with decreased ability to advance the right LE forward or backwards and needs assist with moving the RW at times.  Next, progressed to transfer onto the therapy mat where remainder of the session focused on RUE functional reach and coordination.  He was able to work on picking up 2.5" X 1" blocks from the bedside table and placing in a container.  Min to mod facilitation needed with pt demonstrating ability to open fingers and grasp block 70% of the time, but unable to release it into the container.  When therapist would have to assist with opening of the hand, noted stronger finger flexion as opposed to extension because of motor planning deficits.  Finished session with return to the room and pt left sitting up in the wheelchair with call button and phone in reach and safety alarm belt in place.    Patient has met 8 of 11 long term goals due to improved activity tolerance, improved balance, postural control, ability to compensate for deficits, functional use of  RIGHT upper and RIGHT lower extremity, improved attention, improved awareness and improved coordination.  Patient to discharge at overall Mod Assist level.  Patient's care partner is independent to provide the necessary physical and cognitive assistance at discharge.    Reasons goals not met: Pt needs max assist for LB dressing, mod assist for toilet transfers, and mod assist for tub transfers  Recommendation:  Patient will benefit from  ongoing skilled OT services in home health setting to continue to advance functional skills in the area of BADL and Reduce care partner burden.  Pt still with inconsistent progress, expressive aphasia, right inattention, as well as right hemi paresis.  Currently needs mod assist for most selfcare tasks sit to stand as well as functional transfers.  Feel he will benefit from further [place] to progress to supervision level for greater independence and reduce caregiver burden.    Equipment: tub bench and wide 3:1  Reasons for discharge: treatment goals met and discharge from hospital  Patient/family agrees with progress made and goals achieved: Yes  OT Discharge Precautions/Restrictions  Precautions Precautions: Fall Precaution Comments: right hemiparesis Restrictions Weight Bearing Restrictions: No  Pain Pain Assessment Pain Scale: 0-10 Pain Score: 8  Faces Pain Scale: Hurts a little bit Pain Type: Acute pain Pain Location: Ankle Pain Orientation: Right Pain Descriptors / Indicators: Grimacing;Discomfort Pain Onset: On-going Pain Intervention(s): RN made aware;Other (Comment);Distraction(Adjusted brace) Multiple Pain Sites: No ADL ADL Eating: Supervision/safety Where Assessed-Eating: Wheelchair Grooming: Supervision/safety Where Assessed-Grooming: Wheelchair Upper Body Bathing: Minimal assistance Where Assessed-Upper Body Bathing: Shower Lower Body Bathing: Moderate assistance Where Assessed-Lower Body Bathing: Shower Upper Body Dressing: Minimal assistance Where Assessed-Upper Body Dressing: Wheelchair Lower Body Dressing: Maximal assistance Where Assessed-Lower Body Dressing: Wheelchair, Standing at sink, Sitting at sink Toileting: Moderate assistance Where Assessed-Toileting: Bedside Commode Toilet Transfer: Moderate assistance Toilet Transfer Method: Stand pivot Toilet Transfer Equipment: Extra wide bedside commode Tub/Shower Transfer: Moderate assistance Tub/Shower  Transfer Method: Stand pivot Tub/Shower Equipment: Transfer  tub bench Social research officer, government: Moderate assistance Social research officer, government Method: Radiographer, therapeutic: Gaffer Baseline Vision/History: No visual deficits Patient Visual Report: Diplopia Vision Assessment?: Yes Eye Alignment: Within Functional Limits Ocular Range of Motion: Within Functional Limits Tracking/Visual Pursuits: Decreased smoothness of vertical tracking;Decreased smoothness of horizontal tracking Visual Fields: No apparent deficits Diplopia Assessment: Only with left gaze Perception  Perception: Impaired Inattention/Neglect: Does not attend to right visual field Praxis Praxis: Impaired Praxis Impairment Details: Motor planning;Ideomotor Cognition Overall Cognitive Status: Impaired/Different from baseline Arousal/Alertness: Awake/alert Orientation Level: Oriented X4 Attention: Sustained;Selective Sustained Attention: Appears intact Selective Attention: Impaired Memory: Impaired Awareness: Appears intact Awareness Impairment: Anticipatory impairment;Emergent impairment Problem Solving: Appears intact Sequencing: Appears intact Self Monitoring: Appears intact Safety/Judgment: Impaired Sensation Sensation Light Touch: Impaired by gross assessment Proprioception: Impaired by gross assessment Additional Comments: Pt with decreased sensation in the RUE but unable to accurately assess secondary to expressive deficits and decreased concise response with only "thumb up or down". Coordination Gross Motor Movements are Fluid and Coordinated: No Fine Motor Movements are Fluid and Coordinated: No Coordination and Movement Description: RUE hemiparesis.  He need mod assist for integration of the UE into selfcare tasks such as bathing or holding items to be opened.  Apraxia present as well making movement in the arm and hand functionally inconsistent. Motor  Motor Motor:  Hemiplegia;Motor apraxia Motor - Discharge Observations: Still with significant RUE and RLE hemiparesis Mobility  Bed Mobility Bed Mobility: Right Sidelying to Sit Right Sidelying to Sit: Moderate Assistance - Patient 50-74% Transfers Sit to Stand: Minimal Assistance - Patient > 75% Stand to Sit: Minimal Assistance - Patient > 75%  Trunk/Postural Assessment  Cervical Assessment Cervical Assessment: Within Functional Limits Thoracic Assessment Thoracic Assessment: Exceptions to WFL(thoracic rounding) Lumbar Assessment Lumbar Assessment: Exceptions to WFL(posterior pelvic tilt)  Balance Balance Balance Assessed: Yes Static Sitting Balance Static Sitting - Balance Support: Feet supported Static Sitting - Level of Assistance: 5: Stand by assistance Dynamic Sitting Balance Dynamic Sitting - Balance Support: Feet supported;During functional activity Dynamic Sitting - Level of Assistance: 5: Stand by assistance Static Standing Balance Static Standing - Balance Support: During functional activity;Bilateral upper extremity supported Static Standing - Level of Assistance: 4: Min assist Dynamic Standing Balance Dynamic Standing - Balance Support: During functional activity Dynamic Standing - Level of Assistance: 3: Mod assist Dynamic Standing - Balance Activities: Reaching for objects Extremity/Trunk Assessment RUE Assessment RUE Assessment: Exceptions to [facility] RUE Body System: Neuro Brunstrum levels for arm and hand: Arm;Hand Brunstrum level for arm: Stage IV Movement is deviating from synergy Brunstrum level for hand: Stage V Independence from basic synergies RUE Tone RUE Tone: Mild;Other (Comment)(flexor tone present in the digits and elbow at level 1/4) LUE Assessment LUE Assessment: Within Functional Limits   Laityn Bensen OTR/L 08/11/2019, 4:47 PM

## 2019-08-11 NOTE — Progress Notes (Signed)
Nutrition Follow-up  RD working remotely.  DOCUMENTATION CODES:   Morbid obesity  INTERVENTION:   - Ensure Enlive po BID, each supplement provides 350 kcal and 20 grams of protein  - Continue Magic cup TID with meals, each supplement provides 290 kcal and 9 grams of protein  - Continue MVI with minerals daily  - d/c Vital Cuisine Shakes  NUTRITION DIAGNOSIS:   Inadequate oral intake related to dysphagia as evidenced by other (full liquid diet).  Progressing, pt now on a Dysphagia 2 diet with thin liquids  GOAL:   Patient will meet greater than or equal to 90% of their needs  Progressing  MONITOR:   PO intake, Supplement acceptance, Diet advancement, Labs, Weight trends, TF tolerance, I & O's  REASON FOR ASSESSMENT:   Consult Calorie Count  ASSESSMENT:   48 year old male with PMH of left cerebellar CVA September 2019, CAD non-STEMI status post stenting May 2019, HTN, CKD stage III, tobacco abuse, and medical noncompliance. Presented 07/10/19 with aphasia, right facial droop and right side weakness. UDS positive for marijuana. Cranial CT scan showd areas of prior infarction in the superior aspect of the left cerebellum. MRI/MRA showed occluded left internal carotid artery with reconstitution of the level of the left posterior communicating artery. Scattered punctate foci of cortical infarct involving the anterior left frontal lobe and left parietal lobe. Extensive left hemisphere acute subacute white matter infarcts involving the left centrum semiovale and corona radiata as well as border zone areas. Pt admitted to CIR on 7/31.   8/24 - MBS, diet advanced to Dysphagia 2 with thin liquids  Discharge planned for 8/26.  Unable to interact with pt today due to RD working remotely. RD will decrease Ensure Enlive to BID and d/c Vital Cuisine shakes as pt's PO intake has increased and diet has been advanced.  Weight down a total of 11 lbs since admission to CIR. However, weight  has fluctuated between 311-333 lbs. Weight on 8/21 was 322 lbs, weight today is 311 lbs.  Meal Completion: 75-100%  Medications reviewed and include: Ensure Enlive TID, Remeron, MVI with minerals, Senna  Labs reviewed.  Diet Order:   Diet Order            DIET DYS 2 Room service appropriate? Yes; Fluid consistency: Thin  Diet effective now              EDUCATION NEEDS:   No education needs have been identified at this time  Skin:  Skin Assessment: Reviewed RN Assessment  Last BM:  08/09/19  Height:   Ht Readings from Last 1 Encounters:  07/24/19 6' (1.829 m)    Weight:   Wt Readings from Last 1 Encounters:  08/10/19 (!) 141.4 kg    Ideal Body Weight:  80.9 kg  BMI:  Body mass index is 42.28 kg/m.  Estimated Nutritional Needs:   Kcal:  2200-2400  Protein:  110-125 grams  Fluid:  >/= 2.0 L    Gaynell Face, MS, RD, LDN Inpatient Clinical Dietitian Pager: [phone] Weekend/After Hours: [phone]

## 2019-08-11 NOTE — Discharge Summary (Signed)
Physician Discharge Summary  Patient ID: Zachary Mccann MRN: [id number] DOB/AGE: [birth year] 48 y.o.  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 FINDINGS: There is mild cardiomegaly. Shallow degree of aeration. No large airspace consolidation. No acute osseous abnormality. IMPRESSION: Shallow degree of aeration.  No acute cardiopulmonary process. Mild cardiomegaly Electronically Signed   By: Jonna Clark M.D.   On: 07/28/2019 19:26   Dg Ankle Complete Right  Result Date: 08/10/2019 CLINICAL DATA:  Right ankle pain.  No injury EXAM: RIGHT ANKLE - COMPLETE 3+ VIEW COMPARISON:  None. FINDINGS: There is no evidence of fracture, dislocation, or joint effusion. There is no evidence of arthropathy or other focal bone abnormality. Soft tissues are unremarkable. IMPRESSION: Negative. Electronically Signed   By: Charlett Nose M.D.   On: 08/10/2019 19:10   Ct Head Wo Contrast  Result Date: 07/27/2019 CLINICAL DATA:  Right-sided weakness. EXAM: CT HEAD WITHOUT CONTRAST TECHNIQUE: Contiguous axial images were obtained from the base of the skull through the vertex without intravenous contrast. COMPARISON:  CT scan of July 10, 2019.  FINDINGS: Brain: Stable low density is seen in right periventricular white matter consistent with old infarction. Old left cerebellar infarction is noted. No acute infarction, mass lesion or hemorrhage is noted. No mass effect or midline shift is noted. Ventricular size is within normal limits. Vascular: No hyperdense vessel or unexpected calcification. Skull: Normal. Negative for fracture or focal lesion. Sinuses/Orbits: No acute finding. Other: None. IMPRESSION: No acute intracranial abnormality seen. Electronically Signed   By: Lupita Raider M.D.   On: 07/27/2019 13:33   Dg Chest Port 1 View  Result Date: 07/17/2019 CLINICAL DATA:  Cough with abnormal lung sounds on physical examination EXAM: PORTABLE CHEST 1 VIEW COMPARISON:  May 05, 2018 FINDINGS: There is no edema or consolidation. Heart is mildly enlarged with pulmonary vascularity normal. No adenopathy. No bone lesions. IMPRESSION: Heart mildly enlarged.  No edema or consolidation. Electronically Signed   By: Bretta Bang III M.D.   On: 07/17/2019 20:32   Dg Swallowing Func-speech Pathology  Result Date: 08/10/2019 Objective Swallowing Evaluation: Type of Study: MBS-Modified Barium Swallow Study  Patient Details Name: Zachary Mccann MRN: [id number] Date of Birth: 05/13/1971 Today's Date: 08/10/2019 Time: SLP Start Time (ACUTE ONLY): 1250 -SLP Stop Time (ACUTE ONLY): 1320 SLP Time Calculation (min) (ACUTE ONLY): 30 min Past Medical History: Past Medical History: Diagnosis Date . CAD (coronary artery disease)   a. s/p NSTEMI in 04/2018 with DES to LCx.  [name] Hypertension  . Myocardial infarction (HCC)  . Pneumonia  . Stroke [facility])  Past Surgical History: Past Surgical History: Procedure Laterality Date . CORONARY STENT INTERVENTION N/A 05/05/2018  Procedure: CORONARY STENT INTERVENTION;  Surgeon: Marykay Lex,  MD;  Location: [facility] INVASIVE CV LAB;  Service: Cardiovascular;  Laterality: N/A; . LEFT HEART CATH AND CORONARY ANGIOGRAPHY N/A 05/05/2018   Procedure: LEFT HEART CATH AND CORONARY ANGIOGRAPHY;  Surgeon: Marykay LexHarding, David W, MD;  [facility];  Service: Cardiovascular;  Laterality: N/A; . TEE WITHOUT CARDIOVERSION N/A 09/08/2018  Procedure: TRANSESOPHAGEAL ECHOCARDIOGRAM (TEE);  Surgeon: Laurey MoraleMcLean, Dalton S, MD;  [facility];  Service: Cardiovascular;  Laterality: N/A; HPI: Patient is a 48 y/o male presenting to the ED on 07/10/2019 with primary complaints of slurred speech, R facial droop. MRI extensive left hemisphere acute/subacute white matter infarcts Extensive left hemisphere acute/subacute white matter infarcts involving the left centrum semi ovale and corona radiata. Scattered punctate foci of cortical infarct involving the anterior left frontal lobe and left parietal lobe. Occluded left internal carotid artery with reconstitution of the level of the left posterior communicating artery.  Past medical history significant for CVA, hypertension, CKD 3, coronary artery disease.  Prior MBS on 7/26 revealed a primary severe oral dysphagia with "significant deficits in oral control and sequencing of motor movements, as well as sensory loss on right side.  Pt unable to coordinate use of a straw; boluses were offered from cup edge and spoon.  Liquid barium tends to spill immediately from right side of mouth.  There is limited lingual movement, requiring pt to extend neck in order to allow gravity to help propel material into pharynx.  Liquids tend to move passively into throat; purees are moved with weak tongue pumping.  However, once material reaches sub-vallecular space, pt achieves effective laryngeal vestibule closure and transitions POs through UES with no residue post-swallow.  Due to deficits in timing, there were two occasions when thin liquids entered the larynx before the onset of the swallow, leading to trace aspiration with a delayed cough response."   A full liquid diet thickened to nectar was initiated; pt was advanced to  dysphagia 1/nectars.  Subjective: alert, cooperative Assessment / Plan / Recommendation CHL IP CLINICAL IMPRESSIONS 08/10/2019 Clinical Impression  Pt presents with excellent gains in swallow function, now with a mild-moderate oral dysphagia and normal pharyngeal function.  He demonstrated improved labial seal with only trace anterior spillage of thin liquids; ongoing deficits with oral control and propulsion, but now with improved mastication and more success with manipulating soft solids.  He was unable to drink from a straw, but consumed thin liquids from the edge of a cup with functional control. A 13 mm barium pill was manually removed due to inability to propel into pharynx.  Once POs passed the marker of the ramus, the pharyngeal swallow was initiated with normal speed and strength, with reliable laryngeal vestibular closure.  There was occasional transient penetration (PAS score of 2, considered WNL); there was no aspiration.  Pharyngeal squeeze was effective and there was no residue post-swallow.  Pt is ready to advance to a dysphagia 2 diet with thin liquids; continue to crush meds in puree or give in liquid form.  Pt will benefit from ongoing SLP services to address oral dysphagia.  SLP Visit Diagnosis Dysphagia, oral phase (R13.11) Attention and concentration deficit following -- Frontal lobe and executive function deficit following -- Impact on safety and function No limitations   CHL IP TREATMENT RECOMMENDATION 08/10/2019 Treatment Recommendations Therapy as outlined in treatment plan below   Prognosis 07/14/2019 Prognosis for Safe Diet Advancement Good Barriers to Reach Goals Severity of deficits Barriers/Prognosis Comment -- CHL IP DIET RECOMMENDATION 08/10/2019 SLP Diet Recommendations  Dysphagia 2 (Fine chop) solids;Thin liquid Liquid Administration via Cup Medication Administration Crushed with puree Compensations Small sips/bites;Monitor for anterior loss Postural Changes --   CHL IP OTHER  RECOMMENDATIONS 08/10/2019 Recommended Consults -- Oral Care Recommendations Oral care BID Other Recommendations --   CHL IP FOLLOW UP RECOMMENDATIONS 07/16/2019 Follow up Recommendations Inpatient Rehab   CHL IP FREQUENCY AND DURATION 07/14/2019 Speech Therapy Frequency (ACUTE ONLY) min 3x week Treatment Duration --      CHL IP ORAL PHASE 08/10/2019 Oral Phase Impaired Oral - Pudding Teaspoon -- Oral - Pudding Cup -- Oral - Honey Teaspoon -- Oral - Honey Cup -- Oral - Nectar Teaspoon -- Oral - Nectar Cup NT Oral - Nectar Straw -- Oral - Thin Teaspoon -- Oral - Thin Cup Right anterior bolus loss;Reduced posterior propulsion;Delayed oral transit;Decreased bolus cohesion Oral - Thin Straw -- Oral - Puree Reduced posterior propulsion;Delayed oral transit;Decreased bolus cohesion Oral - Mech Soft Reduced posterior propulsion;Delayed oral transit;Decreased bolus cohesion Oral - Regular -- Oral - Multi-Consistency -- Oral - Pill -- Oral Phase - Comment --  CHL IP PHARYNGEAL PHASE 08/10/2019 Pharyngeal Phase WFL Pharyngeal- Pudding Teaspoon -- Pharyngeal -- Pharyngeal- Pudding Cup -- Pharyngeal -- Pharyngeal- Honey Teaspoon -- Pharyngeal -- Pharyngeal- Honey Cup -- Pharyngeal -- Pharyngeal- Nectar Teaspoon -- Pharyngeal -- Pharyngeal- Nectar Cup NT Pharyngeal -- Pharyngeal- Nectar Straw -- Pharyngeal -- Pharyngeal- Thin Teaspoon -- Pharyngeal -- Pharyngeal- Thin Cup WFL Pharyngeal -- Pharyngeal- Thin Straw -- Pharyngeal -- Pharyngeal- Puree WFL Pharyngeal -- Pharyngeal- Mechanical Soft -- Pharyngeal -- Pharyngeal- Regular -- Pharyngeal -- Pharyngeal- Multi-consistency -- Pharyngeal -- Pharyngeal- Pill -- Pharyngeal -- Pharyngeal Comment --  No flowsheet data found. Blenda MountsCouture, Amanda Laurice 08/10/2019, 2:34 PM              Dg Swallowing Func-speech Pathology  Result Date: 07/14/2019 Objective Swallowing Evaluation: Type of Study: MBS-Modified Barium Swallow Study  Patient Details Name: Enid SkeensShawn T Selinger MRN: [id number] Date of  Birth: 07/27/1971 Today's Date: 07/14/2019 Time: SLP Start Time (ACUTE ONLY): 0915 -SLP Stop Time (ACUTE ONLY): 1000 SLP Time Calculation (min) (ACUTE ONLY): 45 min Past Medical History: Past Medical History: Diagnosis Date . CAD (coronary artery disease)   a. s/p NSTEMI in 04/2018 with DES to LCx.  [name]. Hypertension  . Myocardial infarction (HCC)  . Pneumonia  . Stroke [facility](HCC)  Past Surgical History: Past Surgical History: Procedure Laterality Date . CORONARY STENT INTERVENTION N/A 05/05/2018  Procedure: CORONARY STENT INTERVENTION;  Surgeon: Marykay LexHarding, David W, MD;  [facility];  Service: Cardiovascular;  Laterality: N/A; . LEFT HEART CATH AND CORONARY ANGIOGRAPHY N/A 05/05/2018  Procedure: LEFT HEART CATH AND CORONARY ANGIOGRAPHY;  Surgeon: Marykay LexHarding, David W, MD;  [facility];  Service: Cardiovascular;  Laterality: N/A; . TEE WITHOUT CARDIOVERSION N/A 09/08/2018  Procedure: TRANSESOPHAGEAL ECHOCARDIOGRAM (TEE);  Surgeon: Laurey MoraleMcLean, Dalton S, MD;  [facility];  Service: Cardiovascular;  Laterality: N/A; HPI: Patient is a 48 y/o male presenting to the ED on 07/10/2019 with primary complaints of slurred speech, R facial droop. MRI extensive left hemisphere acute/subacute white matter infarcts Extensive left hemisphere acute/subacute white matter infarcts involving the left centrum semi ovale and corona radiata. Scattered punctate foci of cortical infarct involving the anterior left frontal lobe and left parietal lobe. Occluded left internal carotid artery with reconstitution of the level of the left posterior communicating artery.  Past medical history significant for CVA, hypertension, CKD 3, coronary artery disease.  Subjective: alert, cooperative Assessment /  Plan / Recommendation CHL IP CLINICAL IMPRESSIONS 07/14/2019 Clinical Impression Pt presents with a primary severe oral dysphagia secondary to apraxia and focal CN weakness.  There are significant deficits in oral control and  sequencing of motor movements, as well as sensory loss on right side.  Pt unable to coordinate use of a straw; boluses were offered from cup edge and spoon.  Liquid barium tends to spill immediately from right side of mouth.  There is limited lingual movement, requiring pt to extend neck in order to allow gravity to help propel material into pharynx.  Liquids tend to move passively into throat; purees are moved with weak tongue pumping.  However, once material reaches sub-vallecular space, pt achieves effective laryngeal vestibule closure and transitions POs through UES with no residue post-swallow.  Due to deficits in timing, there were two occasions when thin liquids entered the larynx before the onset of the swallow, leading to trace aspiration with a delayed cough response.  At this time, recommend initiating a full liquid diet, thickened to a nectar consistency.  Pt will need 1:1 assistance to eat safely, with cues to tilt head back and assistance to prevent oral leakage.  He will benefit from a calorie count, as primary issue will be consumption of adequate calories.  I have spoken with Elijah Birk, RD, who is following Mr. Malecha today.  SLP will follow for safety, therapeutic exercise, and diet advancement as warranted.  SLP Visit Diagnosis Dysphagia, oral phase (R13.11) Attention and concentration deficit following -- Frontal lobe and executive function deficit following -- Impact on safety and function Moderate aspiration risk   CHL IP TREATMENT RECOMMENDATION 07/14/2019 Treatment Recommendations Therapy as outlined in treatment plan below   Prognosis 07/14/2019 Prognosis for Safe Diet Advancement Good Barriers to Reach Goals Severity of deficits Barriers/Prognosis Comment -- CHL IP DIET RECOMMENDATION 07/14/2019 SLP Diet Recommendations Nectar thick liquid Liquid Administration via Cup;Spoon Medication Administration Crushed with puree Compensations Minimize environmental distractions;Small sips/bites;Monitor  for anterior loss Postural Changes --   CHL IP OTHER RECOMMENDATIONS 07/14/2019 Recommended Consults -- Oral Care Recommendations Oral care BID Other Recommendations Order thickener from pharmacy   CHL IP FOLLOW UP RECOMMENDATIONS 07/14/2019 Follow up Recommendations Inpatient Rehab   CHL IP FREQUENCY AND DURATION 07/14/2019 Speech Therapy Frequency (ACUTE ONLY) min 3x week Treatment Duration --      CHL IP ORAL PHASE 07/14/2019 Oral Phase Impaired Oral - Pudding Teaspoon -- Oral - Pudding Cup -- Oral - Honey Teaspoon -- Oral - Honey Cup -- Oral - Nectar Teaspoon -- Oral - Nectar Cup Right anterior bolus loss;Weak lingual manipulation;Lingual pumping;Incomplete tongue to palate contact;Reduced posterior propulsion;Right pocketing in lateral sulci;Lingual/palatal residue;Pocketing in anterior sulcus;Delayed oral transit;Decreased bolus cohesion;Left anterior bolus loss Oral - Nectar Straw -- Oral - Thin Teaspoon -- Oral - Thin Cup Right anterior bolus loss;Weak lingual manipulation;Lingual pumping;Incomplete tongue to palate contact;Reduced posterior propulsion;Right pocketing in lateral sulci;Lingual/palatal residue;Pocketing in anterior sulcus;Delayed oral transit;Decreased bolus cohesion;Left anterior bolus loss Oral - Thin Straw -- Oral - Puree Right anterior bolus loss;Weak lingual manipulation;Lingual pumping;Incomplete tongue to palate contact;Reduced posterior propulsion;Right pocketing in lateral sulci;Lingual/palatal residue;Pocketing in anterior sulcus;Delayed oral transit;Decreased bolus cohesion;Left anterior bolus loss Oral - Mech Soft -- Oral - Regular -- Oral - Multi-Consistency -- Oral - Pill -- Oral Phase - Comment --  CHL IP PHARYNGEAL PHASE 07/14/2019 Pharyngeal Phase Impaired Pharyngeal- Pudding Teaspoon -- Pharyngeal -- Pharyngeal- Pudding Cup -- Pharyngeal -- Pharyngeal- Honey Teaspoon -- Pharyngeal -- Pharyngeal- Honey Cup -- Pharyngeal --  Pharyngeal- Nectar Teaspoon -- Pharyngeal -- Pharyngeal-  Nectar Cup Delayed swallow initiation-vallecula;Delayed swallow initiation-pyriform sinuses;Penetration/Aspiration before swallow;Trace aspiration Pharyngeal Material enters airway, passes BELOW cords and not ejected out despite cough attempt by patient Pharyngeal- Nectar Straw -- Pharyngeal -- Pharyngeal- Thin Teaspoon -- Pharyngeal -- Pharyngeal- Thin Cup Delayed swallow initiation-pyriform sinuses;Trace aspiration;Penetration/Aspiration before swallow Pharyngeal Material enters airway, passes BELOW cords and not ejected out despite cough attempt by patient Pharyngeal- Thin Straw -- Pharyngeal -- Pharyngeal- Puree Delayed swallow initiation-vallecula;Delayed swallow initiation-pyriform sinuses Pharyngeal -- Pharyngeal- Mechanical Soft -- Pharyngeal -- Pharyngeal- Regular -- Pharyngeal -- Pharyngeal- Multi-consistency -- Pharyngeal -- Pharyngeal- Pill -- Pharyngeal -- Pharyngeal Comment --  No flowsheet data found. [name] 07/14/2019, 11:23 AM               Labs:  Basic Metabolic Panel: Recent Labs  Lab 08/07/19 0749 08/11/19 0639  NA 136 135  K 4.4 4.6  CL 103 101  CO2 24 22  GLUCOSE 98 92  BUN 16 20  CREATININE 1.51* 1.76*  CALCIUM 9.3 9.2    CBC: Recent Labs  Lab 08/11/19 0927  WBC 7.8  HGB 12.6*  HCT 38.0*  MCV 95.5  PLT 251    CBG:    Family history.  Mother with hypertension, CVA as well as dementia.  Father with hypertension, CVA and alcoholism.  Denies any diabetes mellitus  Brief HPI:   HERSH MINNEY is a 48 y.o. right-handed male with history of left cerebellar CVA September 2019, CAD non-STEMI status post stenting May 2019 followed by Dr. Carlyle Dolly, hypertension, CKD stage III with creatinine 1.82-2.38, tobacco abuse as well as medical noncompliance.  Per chart review lives with fianc to level home.  Bedroom bathroom on main level.  Patient uses a cane and a walker prior to admission.  Presented 07/10/2019 with aphasia, right facial droop and  right-sided weakness.  Urine drug screen positive marijuana, COVID negative, creatinine 2.38.  Cranial CT scan showed areas of prior infarction in the superior aspect of the left cerebellum.  No evidence of acute hemorrhage noted.  Patient did not receive TPA.  MRI and MRA showed occluded left internal carotid artery with reconstitution of the level of the left posterior communicating artery.  Scattered punctate foci of cortical infarct involving the anterior left frontal lobe and left parietal lobe.  Extensive left hemisphere acute subacute white matter infarcts involving the left centrum semiovale and corona radiata as well as border zone areas.  Echocardiogram with ejection fraction of 40%.  Neurology consulted maintained on Plavix for CVA prophylaxis.  Patient noted to have an allergy to aspirin.  Full liquid diet nectar thick consistency.  Patient was admitted for a comprehensive rehab program   Hospital Course: ALMIR BOTTS was admitted to rehab 07/17/2019 for inpatient therapies to consist of PT, ST and OT at least three hours five days a week. Past admission physiatrist, therapy team and rehab RN have worked together to provide customized collaborative inpatient rehab.  Pertaining to patient left MCA and ACA scattered infarct remained stable maintained on Plavix therapy and would follow neurology services.  SCDs for DVT prophylaxis.  His diet has been advanced to dysphagia #2 thin liquid close monitoring of hydration.  He did have a history of CKD stage III creatinine baseline stable 1.51.  Blood pressure control with Norvasc, Coreg as well as hydralazine he would follow-up with primary MD.  Also noted history of medical noncompliance of with patient and family received full  counts regards of maintaining medical regimen.  Mood stabilization with melatonin as well as Remeron.  Patient was participating with therapies.  Continue on Lipitor for hyperlipidemia.  Urine culture 07/28/2019 Enterobacter  completed course of Macrobid.  Patient remained afebrile.  He did have some complaints of right ankle pain exam unremarkable x-rays no fracture dislocation noted.   Blood pressures were monitored on TID basis and     He/ is occasionally incontinent of bowel and bladder with routine toileting schedule.  He/ has made gains during rehab stay and is   He/ will continue to receive follow up therapy   after discharge  Rehab course: During patient's stay in rehab weekly team conferences were held to monitor patient's progress, set goals and discuss barriers to discharge. At admission, patient required +2 physical assist sit to stand, moderate assist supine to sit.  Moderate assist upper body bathing max assist lower body bathing max assist upper body dressing total assist lower body dressing  Physical exam.  Blood pressure 129/82 pulse 82 temperature 98.6 respirations 17 oxygen saturation 97% room air Constitutional.  No distress obese HEENT Head.  Normocephalic and atraumatic Eyes.  Pupils round and reactive to light EOMs normal Neck.  Supple nontender no thyromegaly present Cardiovascular normal rate and rhythm exam reveals no friction rub or murmur heard Respiratory.  Effort normal no respiratory distress no wheezes GI.  Soft no distention nontender positive bowel sounds Musculoskeletal.  General 1+ edema right upper and right lower extremity palpable pulses Neurological.  Alert makes good eye contact of the right left upper and left lower extremity 3- to 4 out of 5.  Right upper extremity 0 out of 5.  Right lower extremity trace out of 5 hip flexors knee extension ankle plantarflexion 0 out of 5 ankle dorsiflexion.  Decreased light touch and pain in right arm and leg.  Patient used thumbs up and down to communicate due to aphasia  He  has had improvement in activity tolerance, balance, postural control as well as ability to compensate for deficits. He has had improvement in functional use  RUE/LUE  and RLE/LLE as well as improvement in awareness.  Patient performed rolling to the right with minimal assistance and rolling to the left with moderate assistance.  Therapist donned bilateral shoe total assist for time management.  Patient significant other present during family education.  Therapy educated caregiver on purpose and use of therapies.  Patient performed +2 transfers each direction rolling walker minimal assistance.  Perform sit to stand with steady minimal assistance total assist for clothing and brief management.  Speech therapy follow-up for dysphasia as well as aphasia.  He was able to communicate his simple needs.  All issues in regards to family teaching discussed with family and caregivers.  Patient discharged to home       Disposition: Discharge disposition: 01-Home or Self Care     Discharge to home   Diet: Dysphagia #2 thin liquids  Special Instructions: No driving, smoking, alcohol or illicit drug use  Medications at discharge. 1 Tylenol as needed 2.  Norvasc 10 mg p.o. daily 3.  Lipitor 40 mg p.o. daily 4.  Coreg 6.25 mg p.o. twice daily 5.  Plavix 75 mg p.o. daily 6.  Voltaren gel 4 times a day to affected area 7.  Hydralazine 25 mg p.o. 3 times daily 8.  Melatonin 3 mg p.o. nightly 9.  Remeron 7.5 mg p.o. nightly 10.  Multivitamin daily 11.  Senokot S2 tablets nightly  Discharge  Instructions    Ambulatory referral to Neurology   Complete by: As directed    An appointment is requested in approximately 4 weeks left MCA infarction   Ambulatory referral to Physical Medicine Rehab   Complete by: As directed    Moderate complexity follow-up 1 to 2 weeks left MCA infarction      Follow-up Information    Marcello Fennel, MD Follow up.   Specialty: Physical Medicine and Rehabilitation Why: Office to call for appointment Contact information: [address] [place] [address] [phone]        Antoine Poche, MD Follow  up.   Specialty: Cardiology Why: Call for appointment Contact information: [address] [place] [address] [phone]        [name], NP Follow up on 09/07/2019.   Specialty: Nurse Practitioner Why: Appointment @ 8:40 Am Contact information: [address] [place] [address] [phone]        [facility] [facility] Follow up.   Why: [name] TO FOLLOW UP WITH HAS PAPERWORK FOR ELIGIBILITY Contact information: [address] [place] [address] [phone]          Signed: Mcarthur Rossetti Jovonni Borquez 08/12/2019, 5:22 AM

## 2019-08-11 NOTE — Progress Notes (Signed)
Physical Therapy Discharge Summary  Patient Details  Name: Zachary Mccann MRN: [id number] Date of Birth: [birth year]  Today's Date: 08/11/2019 PT Individual Time: 1009-1107 and [id number] PT Individual Time Calculation (min): 58 min  And 23 min and  Today's Date: 08/11/2019 PT Missed Time: 7 Minutes Missed Time Reason: Patient unwilling to participate   Patient has met 8 of 9 long term goals due to improved activity tolerance, improved balance, improved postural control, increased strength, ability to compensate for deficits, functional use of  right upper extremity and right lower extremity, improved attention and improved awareness.  Patient to discharge at a wheelchair level min/Mod Assist. Patient's care partner attended family education/training sessions and is independent to provide the necessary physical assistance at discharge.  Reasons goals not met: Patient's level of assistance for dynamic standing balance varies from min/mod depending on his fatigue level and R LE pain level. Patient is able to perform all of the goals at min assist level but his presentation can vary significantly depending on fatigue, pain, and motivation.  Recommendation:  Patient will benefit from ongoing skilled PT services in home health setting to continue to advance safe functional mobility, address ongoing impairments in R hemibody paresis, standing balance, functional mobility training (including bed mobility, transfers, gait, and w/c mobility) pain management, edema management, activity tolerance, wheelchair management/propulsion, and minimize fall risk.  Equipment: Wide RW, 22x18 wheelchair and w/c cushion  Reasons for discharge: treatment goals met, discharge from hospital    Patient/family agrees with progress made and goals achieved: Yes   Skilled Therapeutic Interventions/Progress Updates:  Session 1: Patient received supine in bed and agreeable to therapy session. Via thumbs up/down pt able to  report that his fiance is unable to come to family education/training today as she is having to care for her children. Noted urine smell coming from bed. Performed rolling R/L with min assist using bedrails with cuing for sequencing to increase pt independence while therapist performed max assist LB clothing management - pt able to perform anterior peri-care with set-up assistance. Supine>sit, HOB flat and not using bedrails to replicate home environment with min assist for trunk upright and mod cuing for LE management and sequencing to increase pt independence. Pt c/o R ankle/foot pain and via thumbs up/down pt requesting therapist don ACE wrap for compression - this therapist noted significant decrease in swelling compared to last time this therapist worked with patient. Stand pivot EOB>w/c using RW with min assist for lifting into standing and min assist for balance while turning - max cuing for R LE stepping to improve balance and pt independence/safety.  Patient performed ~56f L hemi-technique w/c propulsion with min assist for straight propulsion with mod cuing for increased use of L LE to steer the w/c. Transported remainder of distance to therapy gym. Pt agreeable to attempt ambulation despite R ankle/foot pain. Sit>stand w/c>RW with min assist for lifting - pt able to strap R hand in orthotic with cuing. Pt ambulated ~492fusing RW demonstrating antalgic gait due to R ankle/foot pain with R knee hyperextension during stance phase - pt deferring further ambulation with him demonstrating decreased safety while ambulating due to increased pain. Transported back to room in w/c and therapist re-wrapped R ankle/foot compression as it noted to be loose. Extensive patient education on not attempting ambulation at home with family/friends as he is to only walk with his physical therapist - educated patient on recommendation for follow-up HHPT to progress functional mobility and decrease caregiver burden. Extensive  patient education regarding importance of rolling schedule every 2 hours to decrease risk of pressure sores in the bed. Pt left sitting in w/c with needs in reach, R LE elevated, and seat belt alarm on. Therapist updated SW that pt's fiance was unable to attend family training.   Session 2: Patient received supine in bed, asleep and initially agreeable to therapy session. Therapist educated pt on importance of him having an HEP to perform daily at home with his fiance's assistance to maintain strength, prevent muscle atrophy, and decrease caregiver burden. Therapist engaged patient in performing supine R LE heel slides x 3 reps with pt minimally attempting to perform the task despite max encouragement and manual facilitation - pt continues to respond with thumbs down stating he can't do this exercise. Transitioned to engaging pt in performing supine R LE hip abduction/adduction x3 reps with manual facilitation/assist for movement with pt again minimally attempting to perform the task. Performed ankle DF/PF with minimal movement x2 reps with pt then stopping engaging in therapy despite max encouragement and education on importance of him performing these exercises. Upon questioning able to determine that pt is ready to go home and doesn't want to participate in therapy at this time. Provided pt printed HEP including the following exercises and educated pt on importance of performing this at home with his fiance's assistance: supine heel slides, supine hip abduction/adduction, and supine ankle PF/DF. Pt left supine in bed with needs in reach and bed alarm on. Patient missed 7 minutes of therapy due to unwilling to participate.  PT Discharge Precautions/Restrictions Precautions Precautions: Fall Precaution Comments: right hemiparesis Restrictions Weight Bearing Restrictions: No Pain Pain Assessment Pain Scale: Faces Pain Score: 8  Faces Pain Scale: Hurts even more(pt unable to verbalize pain level but  rather responds with thumbs up/down) Pain Type: Acute pain Pain Location: Ankle Pain Orientation: Right Pain Descriptors / Indicators: Grimacing;Discomfort Pain Onset: On-going Pain Intervention(s): Rest;Relaxation;RN made aware;Repositioned;Other (Comment)(Compression wrap) Multiple Pain Sites: No Vision/Perception  Vision - Assessment Eye Alignment: Within Functional Limits Ocular Range of Motion: Within Functional Limits Tracking/Visual Pursuits: Decreased smoothness of vertical tracking;Decreased smoothness of horizontal tracking Diplopia Assessment: Only with left gaze Perception Perception: Impaired Inattention/Neglect: Does not attend to right visual field Praxis Praxis: Impaired Praxis Impairment Details: Motor planning;Ideomotor  Cognition Overall Cognitive Status: Impaired/Different from baseline Arousal/Alertness: Awake/alert Orientation Level: Oriented X4(difficult to assess due to aphasia) Attention: Focused Focused Attention: Appears intact Sustained Attention: Appears intact Sensation Sensation Light Touch: Impaired Detail Central sensation comments: via thumbs up pt unable to sense light touch throughout R LE Light Touch Impaired Details: Impaired RLE Hot/Cold: Not tested Proprioception: Impaired by gross assessment Stereognosis: Not tested Additional Comments: Pt with decreased sensation in the RUE but unable to accurately assess secondary to expressive deficits and decreased concise response with only "thumb up or down". Coordination Gross Motor Movements are Fluid and Coordinated: No Fine Motor Movements are Fluid and Coordinated: No Coordination and Movement Description: gross motor movements impaired due to R hemiparesis, impaired proprioception, and pain Motor  Motor Motor: Hemiplegia;Abnormal tone;Abnormal postural alignment and control Motor - Discharge Observations: Still with significant RUE and RLE hemiparesis  Mobility Bed Mobility Bed  Mobility: Rolling Right;Rolling Left;Supine to Sit Rolling Right: Minimal Assistance - Patient > 75% Rolling Left: Minimal Assistance - Patient > 75% Supine to Sit: Minimal Assistance - Patient > 75% Transfers Transfers: Sit to Stand;Stand to Sit;Stand Pivot Transfers Sit to Stand: Minimal Assistance - Patient > 75% Stand to Sit: Minimal  Assistance - Patient > 75% Stand Pivot Transfers: Minimal Assistance - Patient > 75% Stand Pivot Transfer Details: Tactile cues for sequencing;Tactile cues for weight shifting;Manual facilitation for weight shifting;Manual facilitation for placement;Verbal cues for safe use of DME/AE;Verbal cues for gait pattern;Verbal cues for technique;Verbal cues for precautions/safety;Verbal cues for sequencing;Visual cues for safe use of DME/AE Transfer (Assistive device): Rolling walker Locomotion  Gait Ambulation: Yes Gait Assistance: Minimal Assistance - Patient > 75% Gait Distance (Feet): 5 Feet(distance limited due to R foot/ankle pain) Assistive device: Rolling walker Gait Assistance Details: Tactile cues for weight shifting;Tactile cues for sequencing;Verbal cues for safe use of DME/AE;Verbal cues for gait pattern;Manual facilitation for weight shifting Gait Gait: Yes Gait Pattern: Impaired Gait Pattern: Decreased step length - right;Decreased step length - left;Decreased stance time - right;Antalgic;Poor foot clearance - right;Right genu recurvatum Gait velocity: decreased Stairs / Additional Locomotion Stairs: No Wheelchair Mobility Wheelchair Mobility: Yes Wheelchair Assistance: Minimal assistance - Patient >75% Wheelchair Propulsion: Left upper extremity;Left lower extremity Wheelchair Parts Management: Needs assistance Distance: 40f with therapist providing cuing for use of L LE to steer w/c  Trunk/Postural Assessment  Cervical Assessment Cervical Assessment: Exceptions to WFL(left lateral cervical tilt) Thoracic Assessment Thoracic Assessment:  Exceptions to WFL(thoracic kyphosis with shoulder protraction) Lumbar Assessment Lumbar Assessment: Exceptions to WFL(posterior pelvic tilt) Postural Control Postural Control: Deficits on evaluation Trunk Control: impaired Protective Responses: impaired Postural Limitations: decreased  Balance Balance Balance Assessed: Yes Static Sitting Balance Static Sitting - Balance Support: Feet supported Static Sitting - Level of Assistance: 5: Stand by assistance Dynamic Sitting Balance Dynamic Sitting - Balance Support: Feet supported;During functional activity Dynamic Sitting - Level of Assistance: 5: Stand by assistance Static Standing Balance Static Standing - Balance Support: During functional activity;Bilateral upper extremity supported Static Standing - Level of Assistance: 4: Min assist Dynamic Standing Balance Dynamic Standing - Balance Support: During functional activity;Bilateral upper extremity supported Dynamic Standing - Level of Assistance: 4: Min assist;3: Mod assist Dynamic Standing - Balance Activities: Reaching for objects Extremity Assessment      RLE Assessment RLE Assessment: Exceptions to [facility] Strength Right Hip Flexion: 2-/5 Right Hip ABduction: 2-/5 Right Hip ADduction: 2-/5 Right Knee Flexion: 2/5 Right Knee Extension: 2+/5 Right Ankle Dorsiflexion: 1/5 Right Ankle Plantar Flexion: 2/5 LLE Assessment LLE Assessment: Exceptions to [facility] Strength Comments: Grossly 4-/5 throughout demonstrated functionally    CTawana Scale PT, DPT 08/11/2019, 7:54 AM

## 2019-08-12 MED ORDER — WHITE PETROLATUM EX OINT
TOPICAL_OINTMENT | CUTANEOUS | Status: AC
  Administered 2019-08-12: 10:00:00
  Filled 2019-08-12: qty 28.35

## 2019-08-12 NOTE — Progress Notes (Signed)
[facility] PHYSICAL MEDICINE & REHABILITATION PROGRESS NOTE  Subjective/Complaints:  Remains aphasic, thumbs up when pointing to ankle on RIght side  ROS: limited due to language  Objective: Vital Signs: Blood pressure (!) 147/80, pulse 81, temperature 98.3 F (36.8 C), resp. rate 18, height 6' (1.829 m), weight (!) 141.4 kg, SpO2 98 %. Dg Ankle Complete Right  Result Date: 08/10/2019 CLINICAL DATA:  Right ankle pain.  No injury EXAM: RIGHT ANKLE - COMPLETE 3+ VIEW COMPARISON:  None. FINDINGS: There is no evidence of fracture, dislocation, or joint effusion. There is no evidence of arthropathy or other focal bone abnormality. Soft tissues are unremarkable. IMPRESSION: Negative. Electronically Signed   By: Charlett NoseKevin  Dover M.D.   On: 08/10/2019 19:10   Dg Swallowing Func-speech Pathology  Result Date: 08/10/2019 Objective Swallowing Evaluation: Type of Study: MBS-Modified Barium Swallow Study  Patient Details Name: Zachary SkeensShawn T Maeda MRN: [id number] Date of Birth: 06/19/1971 Today's Date: 08/10/2019 Time: SLP Start Time (ACUTE ONLY): 1250 -SLP Stop Time (ACUTE ONLY): 1320 SLP Time Calculation (min) (ACUTE ONLY): 30 min Past Medical History: Past Medical History: Diagnosis Date . CAD (coronary artery disease)   a. s/p NSTEMI in 04/2018 with DES to LCx.  [name]. Hypertension  . Myocardial infarction (HCC)  . Pneumonia  . Stroke [facility](HCC)  Past Surgical History: Past Surgical History: Procedure Laterality Date . CORONARY STENT INTERVENTION N/A 05/05/2018  Procedure: CORONARY STENT INTERVENTION;  Surgeon: Marykay LexHarding, David W, MD;  [facility];  Service: Cardiovascular;  Laterality: N/A; . LEFT HEART CATH AND CORONARY ANGIOGRAPHY N/A 05/05/2018  Procedure: LEFT HEART CATH AND CORONARY ANGIOGRAPHY;  Surgeon: Marykay LexHarding, David W, MD;  [facility];  Service: Cardiovascular;  Laterality: N/A; . TEE WITHOUT CARDIOVERSION N/A 09/08/2018  Procedure: TRANSESOPHAGEAL ECHOCARDIOGRAM (TEE);  Surgeon: Laurey MoraleMcLean,  Dalton S, MD;  [facility];  Service: Cardiovascular;  Laterality: N/A; HPI: Patient is a 48 y/o male presenting to the ED on 07/10/2019 with primary complaints of slurred speech, R facial droop. MRI extensive left hemisphere acute/subacute white matter infarcts Extensive left hemisphere acute/subacute white matter infarcts involving the left centrum semi ovale and corona radiata. Scattered punctate foci of cortical infarct involving the anterior left frontal lobe and left parietal lobe. Occluded left internal carotid artery with reconstitution of the level of the left posterior communicating artery.  Past medical history significant for CVA, hypertension, CKD 3, coronary artery disease.  Prior MBS on 7/26 revealed a primary severe oral dysphagia with "significant deficits in oral control and sequencing of motor movements, as well as sensory loss on right side.  Pt unable to coordinate use of a straw; boluses were offered from cup edge and spoon.  Liquid barium tends to spill immediately from right side of mouth.  There is limited lingual movement, requiring pt to extend neck in order to allow gravity to help propel material into pharynx.  Liquids tend to move passively into throat; purees are moved with weak tongue pumping.  However, once material reaches sub-vallecular space, pt achieves effective laryngeal vestibule closure and transitions POs through UES with no residue post-swallow.  Due to deficits in timing, there were two occasions when thin liquids entered the larynx before the onset of the swallow, leading to trace aspiration with a delayed cough response."   A full liquid diet thickened to nectar was initiated; pt was advanced to dysphagia 1/nectars.  Subjective: alert, cooperative Assessment / Plan / Recommendation CHL IP CLINICAL IMPRESSIONS 08/10/2019 Clinical Impression  Pt presents  with excellent gains in swallow function, now with a mild-moderate oral dysphagia and normal pharyngeal  function.  He demonstrated improved labial seal with only trace anterior spillage of thin liquids; ongoing deficits with oral control and propulsion, but now with improved mastication and more success with manipulating soft solids.  He was unable to drink from a straw, but consumed thin liquids from the edge of a cup with functional control. A 13 mm barium pill was manually removed due to inability to propel into pharynx.  Once POs passed the marker of the ramus, the pharyngeal swallow was initiated with normal speed and strength, with reliable laryngeal vestibular closure.  There was occasional transient penetration (PAS score of 2, considered WNL); there was no aspiration.  Pharyngeal squeeze was effective and there was no residue post-swallow.  Pt is ready to advance to a dysphagia 2 diet with thin liquids; continue to crush meds in puree or give in liquid form.  Pt will benefit from ongoing SLP services to address oral dysphagia.  SLP Visit Diagnosis Dysphagia, oral phase (R13.11) Attention and concentration deficit following -- Frontal lobe and executive function deficit following -- Impact on safety and function No limitations   CHL IP TREATMENT RECOMMENDATION 08/10/2019 Treatment Recommendations Therapy as outlined in treatment plan below   Prognosis 07/14/2019 Prognosis for Safe Diet Advancement Good Barriers to Reach Goals Severity of deficits Barriers/Prognosis Comment -- CHL IP DIET RECOMMENDATION 08/10/2019 SLP Diet Recommendations Dysphagia 2 (Fine chop) solids;Thin liquid Liquid Administration via Cup Medication Administration Crushed with puree Compensations Small sips/bites;Monitor for anterior loss Postural Changes --   CHL IP OTHER RECOMMENDATIONS 08/10/2019 Recommended Consults -- Oral Care Recommendations Oral care BID Other Recommendations --   CHL IP FOLLOW UP RECOMMENDATIONS 07/16/2019 Follow up Recommendations Inpatient Rehab   CHL IP FREQUENCY AND DURATION 07/14/2019 Speech Therapy Frequency  (ACUTE ONLY) min 3x week Treatment Duration --      CHL IP ORAL PHASE 08/10/2019 Oral Phase Impaired Oral - Pudding Teaspoon -- Oral - Pudding Cup -- Oral - Honey Teaspoon -- Oral - Honey Cup -- Oral - Nectar Teaspoon -- Oral - Nectar Cup NT Oral - Nectar Straw -- Oral - Thin Teaspoon -- Oral - Thin Cup Right anterior bolus loss;Reduced posterior propulsion;Delayed oral transit;Decreased bolus cohesion Oral - Thin Straw -- Oral - Puree Reduced posterior propulsion;Delayed oral transit;Decreased bolus cohesion Oral - Mech Soft Reduced posterior propulsion;Delayed oral transit;Decreased bolus cohesion Oral - Regular -- Oral - Multi-Consistency -- Oral - Pill -- Oral Phase - Comment --  CHL IP PHARYNGEAL PHASE 08/10/2019 Pharyngeal Phase WFL Pharyngeal- Pudding Teaspoon -- Pharyngeal -- Pharyngeal- Pudding Cup -- Pharyngeal -- Pharyngeal- Honey Teaspoon -- Pharyngeal -- Pharyngeal- Honey Cup -- Pharyngeal -- Pharyngeal- Nectar Teaspoon -- Pharyngeal -- Pharyngeal- Nectar Cup NT Pharyngeal -- Pharyngeal- Nectar Straw -- Pharyngeal -- Pharyngeal- Thin Teaspoon -- Pharyngeal -- Pharyngeal- Thin Cup WFL Pharyngeal -- Pharyngeal- Thin Straw -- Pharyngeal -- Pharyngeal- Puree WFL Pharyngeal -- Pharyngeal- Mechanical Soft -- Pharyngeal -- Pharyngeal- Regular -- Pharyngeal -- Pharyngeal- Multi-consistency -- Pharyngeal -- Pharyngeal- Pill -- Pharyngeal -- Pharyngeal Comment --  No flowsheet data found. [name] 08/10/2019, 2:34 PM              Recent Labs    08/11/19 0927  WBC 7.8  HGB 12.6*  HCT 38.0*  PLT 251   Recent Labs    08/11/19 0639  NA 135  K 4.6  CL 101  CO2 22  GLUCOSE 92  BUN 20  CREATININE 1.76*  CALCIUM 9.2    Physical Exam: BP (!) 147/80 (BP Location: Left Arm)   Pulse 81   Temp 98.3 F (36.8 C)   Resp 18   Ht 6' (1.829 m)   Wt (!) 141.4 kg   SpO2 98%   BMI 42.28 kg/m   Constitutional: No distress . Vital signs reviewed. HENT: Normocephalic.  Atraumatic. Eyes:  EOMI. No discharge. Cardiovascular: No JVD. Respiratory: Normal effort.  No stridor. GI: Non-distended. Skin: Warm and dry.  Intact. Psych: Flat Musc: Mild LE edema.  No tenderness. Neurological:Alert Right facial weakness Nonverbal. Motor: LUE/LLE: Grossly 4+/5 Right upper extremity: Shoulder abduction 2/5, elbow flexion/extension 2-/5, handgrip 3-/5. Apraxic, stable Right lower extremity: 3/5 proximal to distal  Assessment/Plan: 1. Functional deficits secondary to left brain infarct with history of CVA Stable for D/C today F/u PCP in 3-4 weeks F/u PM&R 2 weeks See D/C summary See D/C instructions Care Tool:  Bathing    Body parts bathed by patient: Chest, Right arm, Abdomen, Front perineal area, Right upper leg, Left upper leg, Right lower leg, Left lower leg, Face   Body parts bathed by helper: Left arm, Buttocks     Bathing assist Assist Level: Moderate Assistance - Patient 50 - 74%     Upper Body Dressing/Undressing Upper body dressing   What is the patient wearing?: Pull over shirt    Upper body assist Assist Level: Minimal Assistance - Patient > 75%    Lower Body Dressing/Undressing Lower body dressing      What is the patient wearing?: Incontinence brief, Pants, Underwear/pull up     Lower body assist Assist for lower body dressing: Moderate Assistance - Patient 50 - 74%     Toileting Toileting    Toileting assist Assist for toileting: Moderate Assistance - Patient 50 - 74%     Transfers Chair/bed transfer  Transfers assist  Chair/bed transfer activity did not occur: Safety/medical concerns(Decreased strength/balance)  Chair/bed transfer assist level: Minimal Assistance - Patient > 75%     Locomotion Ambulation   Ambulation assist   Ambulation activity did not occur: Safety/medical concerns(decreased strength/balance/endurance)  Assist level: Minimal Assistance - Patient > 75% Assistive device: Walker-rolling Max distance: 30ft(distance  limited due to R ankle/foot pain)   Walk 10 feet activity   Assist  Walk 10 feet activity did not occur: Safety/medical concerns  Assist level: Minimal Assistance - Patient > 75% Assistive device: Walker-rolling   Walk 50 feet activity   Assist Walk 50 feet with 2 turns activity did not occur: Safety/medical concerns  Assist level: Moderate Assistance - Patient - 50 - 74% Assistive device: Walker-rolling    Walk 150 feet activity   Assist Walk 150 feet activity did not occur: Safety/medical concerns         Walk 10 feet on uneven surface  activity   Assist Walk 10 feet on uneven surfaces activity did not occur: Safety/medical concerns         Wheelchair     Assist Will patient use wheelchair at discharge?: Yes Type of Wheelchair: Manual Wheelchair activity did not occur: Safety/medical concerns(decreased strength/balance/endurance)  Wheelchair assist level: Minimal Assistance - Patient > 75%, Set up assist Max wheelchair distance: 36ft    Wheelchair 50 feet with 2 turns activity    Assist    Wheelchair 50 feet with 2 turns activity did not occur: Safety/medical concerns(decreased strength/balance/endurance)   Assist Level: Minimal Assistance - Patient > 75%, Set up assist  Wheelchair 150 feet activity     Assist Wheelchair 150 feet activity did not occur: Safety/medical concerns(decreased strength/balance/endurance)   Assist Level: Minimal Assistance - Patient > 75%, Set up assist      Medical Problem List and Plan: 1.Right side weakness, aphasia, dysphasiasecondary to left MCA and ACA scattered infarct due to left ICA occlusion as well as history of previous stroke in the past and medical noncompliance  PMR f/u , HHPT, OT, SLP PRAFO/WHO for RLE and RUE 2. Antithrombotics: -DVT/anticoagulation:SCDs -antiplatelet therapy: plavix 75mg daily 3. Pain Management:Tylenol as needed 4. Mood:Provide emotional  support -antipsychotic agents: N/A 5. Neuropsych: This patientappears to be capable of making decisions on hisown behalf. 6. Skin/Wound Care:Routine skin checks 7. Fluids/Electrolytes/Nutrition: check bmet tomorrow 8. Post stroke dysphagia.   Advanced to D2 thins  Aspiration precautions  Pt doesn't want NGT  Advance diet as tolerated 9. History of CAD with myocardial infarction status post stenting. Continue Plavix. Patient is followed by Dr. Dina RichJonathan Branch 10. Hypertension. Coreg 3.125 mg twice daily.   Hydralazine 10 3 times daily started on 7/31, increased to 25 3 times daily on 8/3  Norvasc 10 mg daily   Vitals:   08/11/19 1937 08/12/19 0549  BP: 105/67 (!) 147/80  Pulse: 83 81  Resp: 18 18  Temp: 98.5 F (36.9 C) 98.3 F (36.8 C)  SpO2: 92% 98%   Controlled on 8/26 11. Hyperlipidemia. Lipitor 12. History of tobacco as well as marijuana use. Counseling 13. Systolic CHF  Echo with EF of 40%40%  Chest x-ray personally reviewed, suggestive of cardiomegaly  Filed Weights   08/06/19 0709 08/07/19 0649 08/10/19 0641  Weight: (!) 151.2 kg (!) 146.4 kg (!) 141.4 kg   Lasix 20 daily DC'd on 8/7 due to limited p.o. intake, will consider restarting if appropriate  ?improving on 8/25 14.  CKD stage III  Cr 1.51 on 8/21  Labs ordered  15.  Morbid obesity: Encouraged weight loss 16. Sleep disturbance  Melatonin started on 8/4, increased on 8/6 DC'd on 8/7  Remeron 7.5 started on 8/7  Stable on 8/25 17.  UTI completed macrobid  18.  Right ankle pain likely developing some contracture does have increased tone in the plantar flexors will order PR AFO Achilles tendinitis added voltaren gel   8/24- ordered R foot up AFO for pt.  Improved  19. Leukocytosis-reolved  afebrile  WBCs 7.8K on 8/25     LOS: 26 days A FACE TO FACE EVALUATION WAS PERFORMED  Erick Colacendrew E Jadira Nierman 08/12/2019, 7:29 AM

## 2019-08-12 NOTE — Progress Notes (Signed)
Patient was discharged home.  Patient left floor via stretcher escorted by transport staff.  Patient verbalized understanding of discharge instructions as given by Marlowe Shores, PA All patient belongings sent with patient including DME and prescriptions.  Patient appears to be in no immediate distress at this time.    Brita Romp, RN

## 2019-08-12 NOTE — Plan of Care (Signed)
  Problem: Consults Goal: RH STROKE PATIENT EDUCATION Description: See Patient Education module for education specifics  Outcome: Completed/Met   Problem: RH BOWEL ELIMINATION Goal: RH STG MANAGE BOWEL WITH ASSISTANCE Description: STG Manage Bowel with [place]. Outcome: Completed/Met   Problem: RH BLADDER ELIMINATION Goal: RH STG MANAGE BLADDER WITH ASSISTANCE Description: STG Manage Bladder With Min Assistance Outcome: Completed/Met   Problem: RH COGNITION-NURSING Goal: RH STG ANTICIPATES NEEDS/CALLS FOR ASSIST W/ASSIST/CUES Description: STG Anticipates Needs/Calls for Assist With supervision Assistance/Cues. Outcome: Completed/Met   Problem: RH KNOWLEDGE DEFICIT Goal: RH STG INCREASE KNOWLEDGE OF STROKE PROPHYLAXIS Description: Pt will demonstrate increased knowledge of stroke prevention including diet management, medication regimen, and follow up care with primary care physician at the time of discharge with min assist/cues.  Outcome: Completed/Met

## 2019-08-12 NOTE — Progress Notes (Signed)
Social Work Patient ID: Zachary Mccann, male   DOB: 08-26-1971, 48 y.o.   MRN: [id number]     Diagnosis codes:i63.9, I69.391 & I50.21  Height:   6'0             Weight: 323 lbs           Patient suffers from L-CVA   which impairs their ability to perform daily activities like ADL's and tolieting   in the home.  A walker  will not resolve issue with performing activities of daily living.  A wheelchair will allow patient to safely perform daily activities.  Patient will need a heavy duty wheelchair due to weight and can self propel in one. He also has a caregiver who will be assisting him at home. He is ambulating 10 ft with mod assist level of care, so essentially non-ambulatory due to his CVA

## 2019-08-14 ENCOUNTER — Telehealth: Payer: Self-pay | Admitting: Registered Nurse

## 2019-08-14 NOTE — Progress Notes (Signed)
Social Work Discharge Note   The overall goal for the admission was met for:   Discharge location: [place]  Length of Stay: Yes-26 DAYS  Discharge activity level: Yes-MIN-MOD LEVEL OF ASSIST  Home/community participation: Yes  Services provided included: MD, RD, PT, OT, SLP, RN, CM, Pharmacy and SW  Financial Services: Other: PENDING MEDICAID  Follow-up services arranged: Home Health: [facility]-PT,OT.SPT, DME: [facility]-WHEELCHAIR, WIDE ROLLING WALKER AND WIDE BEDSIDE COMMODE and Patient/Family has no preference for HH/DME agencies  Comments (or additional information):[name] WAS HERE FOR SEVERAL THERAPY SESSIONS AND LEARNED PT'S CARE. SHE FEELS COMFORTABLE WITH PT'S CARE AND IS AWARE OF HIS 24 HR CARE NEEDS. SHE WILL FOLLOW UP WITH [facility] TO CONNECT PT WITH THEM SO HE CAN GET ASSIST WITH HIS MEDICATIONS AND PCP.  Patient/Family verbalized understanding of follow-up arrangements: Yes  Individual responsible for coordination of the follow-up plan: [name]-FIANCE [phone]   Confirmed correct DME delivered: Elease Hashimoto 08/14/2019    Jazsmin Couse, Gardiner Rhyme

## 2019-08-14 NOTE — Telephone Encounter (Signed)
Transitional care call placed, no answer. Left message to return the call.  

## 2019-08-17 NOTE — Telephone Encounter (Signed)
Transitional Care call Transitional Questions answered by Zachary Mccann  Patient name: Zachary Mccann  DOB: 02/06/1971  1. Are you/is patient experiencing any problems since coming home? No a. Are there any questions regarding any aspect of care? No 2. Are there any questions regarding medications administration/dosing? No a. Are meds being taken as prescribed? Yes b. "Patient should review meds with caller to confirm" Medication List Reviewed 3. Have there been any falls? No 4. Has Home Health been to the house and/or have they contacted you? Yes, [facility] a. If not, have you tried to contact them? NA b. Can we help you contact them? No 5. Are bowels and bladder emptying properly? Yes a. Are there any unexpected incontinence issues? No b. If applicable, is patient following bowel/bladder programs? NA 6. Any fevers, problems with breathing, unexpected pain? No 7. Are there any skin problems or new areas of breakdown? No 8. Has the patient/family member arranged specialty MD follow up (ie cardiology/neurology/renal/surgical/etc.)?  (      Ms. [name] was instructed to call Cardiology and Neurology to schedule HFU appointments, she verbalizes understanding.  a. Can we help arrange? (                                     ) 9. Does the patient need any other services or support that we can help arrange? No 10. Are caregivers following through as expected in assisting the patient? No 11. Has the patient quit smoking, drinking alcohol, or using drugs as recommended? Ms. [name] states Mr. [name] doesn't smoke, drink alcohol or use illicit drugs.   Appointment date/time 08/20/2019  arrival time 1:40 for 2:00 appointment with Bayard Hugger at [address] . Ms. [name] states they are unable to keep scheduled appointment, she's trying to have a ramp build, also reports Mr. [name] can't leave the home unless a ramp is built.[name] She was instructed to call office to  schedule appointment, she verbalizes understanding.

## 2019-08-20 ENCOUNTER — Encounter: Payer: Self-pay | Admitting: Registered Nurse

## 2019-08-20 DIAGNOSIS — I69321 Dysphasia following cerebral infarction: Secondary | ICD-10-CM

## 2019-08-20 DIAGNOSIS — I69351 Hemiplegia and hemiparesis following cerebral infarction affecting right dominant side: Secondary | ICD-10-CM

## 2019-08-20 DIAGNOSIS — Z8701 Personal history of pneumonia (recurrent): Secondary | ICD-10-CM

## 2019-08-20 DIAGNOSIS — N183 Chronic kidney disease, stage 3 (moderate): Secondary | ICD-10-CM

## 2019-08-20 DIAGNOSIS — Z9181 History of falling: Secondary | ICD-10-CM

## 2019-08-20 DIAGNOSIS — R1311 Dysphagia, oral phase: Secondary | ICD-10-CM

## 2019-08-20 DIAGNOSIS — R1310 Dysphagia, unspecified: Secondary | ICD-10-CM

## 2019-08-20 DIAGNOSIS — I2510 Atherosclerotic heart disease of native coronary artery without angina pectoris: Secondary | ICD-10-CM

## 2019-08-20 DIAGNOSIS — I130 Hypertensive heart and chronic kidney disease with heart failure and stage 1 through stage 4 chronic kidney disease, or unspecified chronic kidney disease: Secondary | ICD-10-CM

## 2019-08-20 DIAGNOSIS — I252 Old myocardial infarction: Secondary | ICD-10-CM

## 2019-08-20 DIAGNOSIS — I69391 Dysphagia following cerebral infarction: Secondary | ICD-10-CM

## 2019-08-20 DIAGNOSIS — R471 Dysarthria and anarthria: Secondary | ICD-10-CM

## 2019-08-20 DIAGNOSIS — I509 Heart failure, unspecified: Secondary | ICD-10-CM

## 2019-08-20 DIAGNOSIS — I69320 Aphasia following cerebral infarction: Secondary | ICD-10-CM

## 2019-08-20 DIAGNOSIS — E785 Hyperlipidemia, unspecified: Secondary | ICD-10-CM

## 2019-08-20 DIAGNOSIS — Z7902 Long term (current) use of antithrombotics/antiplatelets: Secondary | ICD-10-CM

## 2019-08-20 DIAGNOSIS — F17210 Nicotine dependence, cigarettes, uncomplicated: Secondary | ICD-10-CM

## 2019-09-07 ENCOUNTER — Ambulatory Visit (INDEPENDENT_AMBULATORY_CARE_PROVIDER_SITE_OTHER): Payer: Self-pay | Admitting: Internal Medicine

## 2019-09-09 ENCOUNTER — Telehealth (INDEPENDENT_AMBULATORY_CARE_PROVIDER_SITE_OTHER): Payer: Self-pay

## 2019-09-09 ENCOUNTER — Encounter (INDEPENDENT_AMBULATORY_CARE_PROVIDER_SITE_OTHER): Payer: Self-pay

## 2019-09-09 ENCOUNTER — Other Ambulatory Visit (INDEPENDENT_AMBULATORY_CARE_PROVIDER_SITE_OTHER): Payer: Self-pay | Admitting: Internal Medicine

## 2019-09-09 MED ORDER — HYDRALAZINE HCL 25 MG PO TABS: 25.0000 mg | ORAL_TABLET | Freq: Three times a day (TID) | ORAL | 0 refills | Status: DC

## 2019-09-09 MED ORDER — MIRTAZAPINE 7.5 MG PO TABS: 7.5000 mg | ORAL_TABLET | Freq: Every day | ORAL | 0 refills | Status: DC

## 2019-09-09 MED ORDER — CARVEDILOL 6.25 MG PO TABS: 6.2500 mg | ORAL_TABLET | Freq: Two times a day (BID) | ORAL | 0 refills | Status: DC

## 2019-09-09 MED ORDER — AMLODIPINE BESYLATE 10 MG PO TABS: 10.0000 mg | ORAL_TABLET | Freq: Every day | ORAL | 0 refills | Status: DC

## 2019-09-09 MED ORDER — CLOPIDOGREL BISULFATE 75 MG PO TABS: 75.0000 mg | ORAL_TABLET | Freq: Every day | ORAL | 0 refills | Status: AC

## 2019-09-09 MED ORDER — ATORVASTATIN CALCIUM 40 MG PO TABS: 40.0000 mg | ORAL_TABLET | Freq: Every day | ORAL | 0 refills | Status: DC

## 2019-09-09 NOTE — Progress Notes (Signed)
Hello:  Please arrange for transportation to our office on Date:10/01/2019  @ 10:20. Pt is to arrive 15 minutes early for check-in.  [address], [place] [address]  [phone] option [phone]  Thank you ,     Winn Jock

## 2019-09-09 NOTE — Telephone Encounter (Signed)
Pt spouse, [name] called asking for refill of medications of all he was on in Hospital. Pt cannot talk due to stroke. I set appt for f/u post hosp stay on 10/01/2019. Pt is going to need all medications refilled. Will need to also set up for [facility] to help w/o insurance plans.  Also pt will need  Help with transportation to get to office visit. Will Contact [facility] or [facility] -Non emergency  transport.

## 2019-09-09 NOTE — Progress Notes (Deleted)
H

## 2019-09-09 NOTE — Telephone Encounter (Signed)
nh

## 2019-09-29 ENCOUNTER — Telehealth: Payer: Self-pay

## 2019-09-29 ENCOUNTER — Inpatient Hospital Stay: Payer: MEDICAID | Admitting: Adult Health

## 2019-09-29 NOTE — Telephone Encounter (Signed)
Attempted to call pt to inform him that we would like to r/s his appointment. Because our provider is out of the office and currently not feeling well.  However, our phones are out once again. Possibly due to the frequent construction around the area.   I will go a head and cancel the pts appointment so that he is not charged a no show fee.

## 2019-10-01 ENCOUNTER — Other Ambulatory Visit: Payer: Self-pay

## 2019-10-01 ENCOUNTER — Ambulatory Visit (INDEPENDENT_AMBULATORY_CARE_PROVIDER_SITE_OTHER): Payer: Self-pay | Admitting: Internal Medicine

## 2019-10-01 ENCOUNTER — Encounter (INDEPENDENT_AMBULATORY_CARE_PROVIDER_SITE_OTHER): Payer: Self-pay | Admitting: Internal Medicine

## 2019-10-01 VITALS — BP 130/80 | HR 72 | Ht 72.0 in | Wt 336.0 lb

## 2019-10-01 DIAGNOSIS — N1831 Chronic kidney disease, stage 3a: Secondary | ICD-10-CM

## 2019-10-01 DIAGNOSIS — I10 Essential (primary) hypertension: Secondary | ICD-10-CM

## 2019-10-01 DIAGNOSIS — I63512 Cerebral infarction due to unspecified occlusion or stenosis of left middle cerebral artery: Secondary | ICD-10-CM

## 2019-10-01 DIAGNOSIS — Z23 Encounter for immunization: Secondary | ICD-10-CM

## 2019-10-01 NOTE — Progress Notes (Signed)
Wellness Office Visit  Subjective:  Patient ID: Zachary Mccann, male    DOB: 1971-11-05  Age: 48 y.o. MRN: [id number]  CC: This man comes in for follow-up regarding his multiple medical problems.  He recently had a stroke around July of this year unfortunately. HPI  He is now wheelchair-bound and is going to be following up with neurology and cardiology. This was a left middle cerebral artery stroke and this is left him almost speechless and in a wheelchair. We had been seeing him for his morbid obesity and he really not lost any significant weight. This morning, he is not really able to speak and cannot really tell me much about what is been going on.  I looked over the medical records from the hospital and the rehabilitation and he apparently has made some progress but not sufficient.  He has been at home since the end of August and his girlfriend lives with him. Past Medical History:  Diagnosis Date   CAD (coronary artery disease)    a. s/p NSTEMI in 04/2018 with DES to LCx.    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 Lives with girlfriend.     Current Meds  Medication Sig   acetaminophen (TYLENOL) 325 MG tablet Take 2 tablets (650 mg total) by mouth every 4 (four) hours as needed for mild pain (or temp > 37.5 C (99.5 F)).   amLODipine (NORVASC) 10 MG tablet Take 1 tablet (10 mg total) by mouth daily.   atorvastatin (LIPITOR) 40 MG tablet Take 1 tablet (40 mg total) by mouth daily at 6 PM.   carvedilol (COREG) 6.25 MG tablet Take 1 tablet (6.25 mg total) by mouth 2  (two) times daily with a meal.   clopidogrel (PLAVIX) 75 MG tablet Take 1 tablet (75 mg total) by mouth daily.   diclofenac sodium (VOLTAREN) 1 % GEL Apply 2 g topically 4 (four) times daily.   hydrALAZINE (APRESOLINE) 25 MG tablet Take 1 tablet (25 mg total) by mouth 3 (three) times daily.   Melatonin 3 MG TABS Take 1 tablet (3 mg total) by mouth at bedtime.   mirtazapine (REMERON) 7.5 MG tablet Take 1 tablet (7.5 mg total) by mouth at bedtime.   Multiple Vitamin (MULTIVITAMIN WITH MINERALS) TABS tablet Take 1 tablet by mouth daily.   senna-docusate (SENOKOT-S) 8.6-50 MG tablet Take 2 tablets by mouth at bedtime.        Objective:   Today's Vitals: BP 130/80    Pulse 72    Ht 6' (1.829 m)    Wt (!) 336 lb (152.4 kg)    BMI 45.57 kg/m  Vitals with BMI 10/01/2019 08/12/2019 08/11/2019  Height 6\' 0"  - -  Weight 336 lbs - -  BMI 45.56 - -  Systolic 130 147  Diastolic 80 80 67  Pulse 72 81 83     Physical Exam   He remains morbidly obese.  Blood pressure is controlled now.  He is  alert and does understand my questions.  Verbalization is very difficult for him.    Assessment   1. Stage 3a chronic kidney disease   2. Morbid obesity ([place])   3. Essential hypertension   4. Left middle cerebral artery stroke ([place])       Tests ordered Orders Placed This Encounter  Procedures   CBC   COMPLETE METABOLIC PANEL WITH GFR   Lipid panel     Plan: 1. He will continue with all medications for his chronic conditions above. 2. Blood work is ordered as above. 3. Further recommendations will depend on blood results and we will follow-up with him in about 6 months time.  Unfortunately, he has now had end-stage disease from his multiple risk factors and I am not sure how much recovery he will make at this stage.   No orders of the defined types were placed in this encounter.   Doree Albee, MD

## 2019-10-02 LAB — COMPLETE METABOLIC PANEL WITH GFR
AG Ratio: 1 (calc) (ref 1.0–2.5)
ALT: 24 U/L (ref 9–46)
AST: 21 U/L (ref 10–40)
Albumin: 4.1 g/dL (ref 3.6–5.1)
Alkaline phosphatase (APISO): 111 U/L (ref 36–130)
BUN/Creatinine Ratio: 20 (calc) (ref 6–22)
BUN: 33 mg/dL — ABNORMAL HIGH (ref 7–25)
CO2: 14 mmol/L — ABNORMAL LOW (ref 20–32)
Calcium: 10 mg/dL (ref 8.6–10.3)
Chloride: 105 mmol/L (ref 98–110)
Creat: 1.65 mg/dL — ABNORMAL HIGH (ref 0.60–1.35)
GFR, Est African American: 56 mL/min/{1.73_m2} — ABNORMAL LOW (ref >=60)
GFR, Est Non African American: 48 mL/min/{1.73_m2} — ABNORMAL LOW (ref >=60)
Globulin: 4.2 g/dL (calc) — ABNORMAL HIGH (ref 1.9–3.7)
Glucose, Bld: 89 mg/dL (ref 65–99)
Potassium: 4.4 mmol/L (ref 3.5–5.3)
Sodium: 137 mmol/L (ref 135–146)
Total Bilirubin: 0.3 mg/dL (ref 0.2–1.2)
Total Protein: 8.3 g/dL — ABNORMAL HIGH (ref 6.1–8.1)

## 2019-10-02 LAB — LIPID PANEL
Cholesterol: 117 mg/dL (ref <200)
HDL: 40 mg/dL (ref >=40)
LDL Cholesterol (Calc): 60 mg/dL (calc)
Non-HDL Cholesterol (Calc): 77 mg/dL (calc) (ref <130)
Total CHOL/HDL Ratio: 2.9 (calc) (ref <5.0)
Triglycerides: 85 mg/dL (ref <150)

## 2019-10-08 ENCOUNTER — Encounter: Payer: Self-pay | Admitting: Adult Health

## 2019-10-12 ENCOUNTER — Telehealth (INDEPENDENT_AMBULATORY_CARE_PROVIDER_SITE_OTHER): Payer: Self-pay

## 2019-10-12 DIAGNOSIS — K5900 Constipation, unspecified: Secondary | ICD-10-CM

## 2019-10-12 DIAGNOSIS — I10 Essential (primary) hypertension: Secondary | ICD-10-CM

## 2019-10-12 DIAGNOSIS — G479 Sleep disorder, unspecified: Secondary | ICD-10-CM

## 2019-10-12 DIAGNOSIS — I63512 Cerebral infarction due to unspecified occlusion or stenosis of left middle cerebral artery: Secondary | ICD-10-CM

## 2019-10-12 MED ORDER — AMLODIPINE BESYLATE 10 MG PO TABS: 10.0000 mg | ORAL_TABLET | Freq: Every day | ORAL | 0 refills | Status: DC

## 2019-10-12 MED ORDER — MIRTAZAPINE 7.5 MG PO TABS: 7.5000 mg | ORAL_TABLET | Freq: Every day | ORAL | 0 refills | Status: DC

## 2019-10-12 MED ORDER — HYDRALAZINE HCL 25 MG PO TABS: 25.0000 mg | ORAL_TABLET | Freq: Three times a day (TID) | ORAL | 0 refills | Status: DC

## 2019-10-12 MED ORDER — CARVEDILOL 6.25 MG PO TABS: 6.2500 mg | ORAL_TABLET | Freq: Two times a day (BID) | ORAL | 0 refills | Status: DC

## 2019-10-12 MED ORDER — SENNOSIDES-DOCUSATE SODIUM 8.6-50 MG PO TABS: 2.0000 | ORAL_TABLET | Freq: Every day | ORAL | Status: AC

## 2019-10-12 MED ORDER — ATORVASTATIN CALCIUM 40 MG PO TABS: 40.0000 mg | ORAL_TABLET | Freq: Every day | ORAL | 0 refills | Status: DC

## 2019-10-12 NOTE — Telephone Encounter (Signed)
Rx sent to pharmacy per patient request. 

## 2019-10-12 NOTE — Telephone Encounter (Signed)
Thank you :)

## 2019-10-20 ENCOUNTER — Inpatient Hospital Stay: Payer: MEDICAID | Admitting: Adult Health

## 2019-10-29 ENCOUNTER — Inpatient Hospital Stay: Payer: MEDICAID | Admitting: Adult Health

## 2019-10-29 ENCOUNTER — Telehealth: Payer: Self-pay

## 2019-10-29 NOTE — Progress Notes (Deleted)
[facility] [address] [place]. [place] [address] [phone]       HOSPITAL FOLLOW UP NOTE  [name] Date of Birth:  1971-09-09 Medical Record Number:  [id number]   Reason for Referral:  hospital stroke follow up    CHIEF COMPLAINT:  No chief complaint on file.   HPI: [name] being seen today for in office hospital follow-up regarding left MCA and ACA scattered infarcts due to left ICA occlusion on 07/10/2019.  History obtained from *** and chart review. Reviewed all radiology images and labs personally.  Mr. [name] is a 48 y.o. male with history of ongoing tobacco use, CAD w stent / Mi, hypertension, previous stroke with residual left-sided deficits and non compliant with medications after losing his insurance coverage, who was found to be much more dysarthric with right facial droop  and right-sided weakness.  Stroke work-up revealed left MCA and ACA scattered infarcts as evidenced on MRI due to left ICA occlusion (large vessel disease).  He did not receive IV t-PA due to late presentation (>4.5 hours from time of onset).  MRI showed extensive left hemisphere acute/subacute white matter infarcts involving the left centrum semiovale and corona radiata as well as border zone areas.  MRA showed left ICA occlusion, bilateral VA distal occlusion, BA proximal occlusion with distal retrograde recon from PCOMs.  Carotid Doppler showed left ICA/CCA occlusion.  2D echo showed an EF of 40%.  LDL 90.  A1c 5.6.  UDS positive for THC.  Recommended initiation of Plavix as he has a history of aspirin allergy.  Prior stroke 08/2018 left superior cerebellar infarct as well as evidence of prior strokes.  Hypercoagulable work-up at that time negative.  TEE unremarkable.  30 cardiac event monitor outpatient negative for A. fib.  History of CAD with cardiomyopathy non-STEMI post stent 04/2018 and medication noncompliance due to financial difficulty.  Current  tobacco use of smoking cessation counseling provided.  HTN stable and recommended long-term BP goal 130-150 due to left ICA occlusion.  Initiated atorvastatin 40 mg daily.  Other stroke risk factors include intracranial stenosis, previous EtOH use and prior drug use, THC use, obesity, family history of stroke and medication noncompliance.  Residual deficits of dysphagia, expressive aphasia and right hemiparesis and discharged to CIR for ongoing therapies.  He was discharged home with recommendation of ongoing therapies on 08/12/2019 with residual left hemiparesis, right hemiparesis, dysphagia and aphasia.      ROS:   14 system review of systems performed and negative with exception of ***  PMH:  Past Medical History:  Diagnosis Date   CAD (coronary artery disease)    a. s/p NSTEMI in 04/2018 with DES to LCx.    Hypertension    Myocardial infarction (HCC)    Pneumonia    Stroke (HCC)     PSH:  Past Surgical History:  Procedure Laterality Date   CORONARY STENT INTERVENTION N/A 05/05/2018   Procedure: CORONARY STENT INTERVENTION;  Surgeon: Marykay Lex, MD;  Location: [facility] INVASIVE CV LAB;  Service: Cardiovascular;  Laterality: N/A;   LEFT HEART CATH AND CORONARY ANGIOGRAPHY N/A 05/05/2018   Procedure: LEFT HEART CATH AND CORONARY ANGIOGRAPHY;  Surgeon: Marykay Lex, MD;  Location: [facility] INVASIVE CV LAB;  Service: Cardiovascular;  Laterality: N/A;   TEE WITHOUT CARDIOVERSION N/A 09/08/2018   Procedure: TRANSESOPHAGEAL ECHOCARDIOGRAM (TEE);  Surgeon: Laurey Morale, MD;  Location: [facility] ENDOSCOPY;  Service: Cardiovascular;  Laterality: N/A;    Social  History:  Social History   Socioeconomic History   Marital status: Single    Spouse name: Not on file   Number of children: Not on file   Years of education: Not on file   Highest education level: Not on file  Occupational History   Not on file  Social Needs   Financial resource strain: Not on file   Food insecurity     Worry: Not on file    Inability: Not on file   Transportation needs    Medical: Not on file    Non-medical: Not on file  Tobacco Use   Smoking status: Former Smoker    Types: Cigarettes   Smokeless tobacco: Never Used   Tobacco comment: smoked for 10-20 years at 1/2 ppd as of 2019  Substance and Sexual Activity   Alcohol use: Not Currently   Drug use: Not Currently   Sexual activity: Yes  Lifestyle   Physical activity    Days per week: Not on file    Minutes per session: Not on file   Stress: Not on file  Relationships   Social connections    Talks on phone: Not on file    Gets together: Not on file    Attends religious service: Not on file    Active member of club or organization: Not on file    Attends meetings of clubs or organizations: Not on file    Relationship status: Not on file   Intimate partner violence    Fear of current or ex partner: Not on file    Emotionally abused: Not on file    Physically abused: Not on file    Forced sexual activity: Not on file  Other Topics Concern   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 Lives with girlfriend.    Family History:  Family History  Problem Relation Age of Onset   Hypertension Mother    Stroke Mother    Dementia Mother    Hypertension Father    Stroke Father    Cancer Father    Alcoholism Father    Cancer Sister     Medications:   Current Outpatient Medications on File Prior to Visit  Medication Sig Dispense Refill   acetaminophen (TYLENOL) 325 MG tablet Take 2 tablets (650 mg total) by mouth every 4 (four) hours as needed for mild pain (or temp > 37.5 C (99.5 F)).     amLODipine (NORVASC) 10 MG tablet Take 1 tablet (10 mg total) by mouth daily. 30 tablet 0   atorvastatin (LIPITOR) 40 MG tablet Take 1 tablet (40 mg total) by mouth daily at 6 PM. 30 tablet 0   carvedilol (COREG) 6.25  MG tablet Take 1 tablet (6.25 mg total) by mouth 2 (two) times daily with a meal. 60 tablet 0   diclofenac sodium (VOLTAREN) 1 % GEL Apply 2 g topically 4 (four) times daily. 2 g 0   hydrALAZINE (APRESOLINE) 25 MG tablet Take 1 tablet (25 mg total) by mouth 3 (three) times daily. 90 tablet 0   Melatonin 3 MG TABS Take 1 tablet (3 mg total) by mouth at bedtime. 30 tablet 0   mirtazapine (REMERON) 7.5 MG tablet Take 1 tablet (7.5 mg total) by mouth at bedtime. 30 tablet 0   Multiple Vitamin (MULTIVITAMIN WITH MINERALS) TABS tablet Take 1 tablet by mouth daily.  0   senna-docusate (SENOKOT-S) 8.6-50 MG  tablet Take 2 tablets by mouth at bedtime.     No current facility-administered medications on file prior to visit.     Allergies:   Allergies  Allergen Reactions   Aspirin Swelling     Physical Exam  There were no vitals filed for this visit. There is no height or weight on file to calculate BMI. No exam data present  No flowsheet data found.   General: well developed, well nourished, seated, in no evident distress Head: head normocephalic and atraumatic.   Neck: supple with no carotid or supraclavicular bruits Cardiovascular: regular rate and rhythm, no murmurs Musculoskeletal: no deformity Skin:  no rash/petichiae Vascular:  Normal pulses all extremities   Neurologic Exam Mental Status: Awake and fully alert. Oriented to place and time. Recent and remote memory intact. Attention span, concentration and fund of knowledge appropriate. Mood and affect appropriate.  Cranial Nerves: Fundoscopic exam reveals sharp disc margins. Pupils equal, briskly reactive to light. Extraocular movements full without nystagmus. Visual fields full to confrontation. Hearing intact. Facial sensation intact. Face, tongue, palate moves normally and symmetrically.  Motor: Normal bulk and tone. Normal strength in all tested extremity muscles. Sensory.: intact to touch , pinprick , position and  vibratory sensation.  Coordination: Rapid alternating movements normal in all extremities. Finger-to-nose and heel-to-shin performed accurately bilaterally. Gait and Station: Arises from chair without difficulty. Stance is normal. Gait demonstrates normal stride length and balance Reflexes: 1+ and symmetric. Toes downgoing.     NIHSS  *** Modified Rankin  *** CHA2DS2-VASc *** HAS-BLED ***   Diagnostic Data (Labs, Imaging, Testing)  CT HEAD WO CONTRAST 07/10/2019 IMPRESSION: Area of prior infarction in the superior aspect of the left cerebellum. Areas of prior ischemia are noted. The changes on the left are new from the prior exam but consistent with more subacute to chronic ischemia. No evidence of acute hemorrhage noted.  [name] MR MRA HEAD  07/11/2019 IMPRESSION: 1. Occluded left internal carotid artery with reconstitution of the level of the left posterior communicating artery. Of note, there is narrowing within the left posterior communicating artery. 2. No significant stenosis or occlusion of the right internal carotid artery with a prominent right posterior communicating artery that is feeding the posterior circulation. 3. High-grade stenosis or occlusion of distal vertebral arteries bilaterally and of the proximal basilar artery. Flow is reconstituted in the distal basilar artery, presumably through the right posterior communicating artery. 4. Asymmetric attenuation of left MCA branch vessels without a focal stenosis or protrusion. This likely to represents the proximal occlusion decreased perfusion pressure due. 5. Extensive left hemisphere acute/subacute white matter infarcts involving the left centrum semi ovale and corona radiata as well as border zone areas. This corresponds with the proximal left ICA occlusion. 6. There are scattered punctate foci of cortical infarct involving the anterior left frontal lobe and left parietal lobe. There is  also some involvement of the cingulate gyrus.  ECHOCARDIOGRAM 07/11/2019 IMPRESSIONS  1. The left ventricle has a visually estimated ejection fraction of 40%. The cavity size was normal. There is mildly increased left ventricular wall thickness. Left ventricular diastolic Doppler parameters are consistent with impaired relaxation. No  obvious, formed LV thrombus seen with Definity contrast.  2. There is akinesis of the left ventricular, basal-mid inferolateral wall and inferior wall.  3. The right ventricle has normal systolic function. The cavity was normal. There is no increase in right ventricular wall thickness.  4. The mitral valve is grossly normal.  5.  The tricuspid valve is grossly normal.  6. The aortic valve was not well visualized.  7. The aorta is normal in size and structure.  8. The interatrial septum was not well visualized.  VAS US CAROTID DUPLEX BILATERAL 07/11/2019 Summary: Right Carotid: The extracranial vessels were near-normal with only minimal wall                thickening or plaque. Left Carotid: The CCA appears occluded. Vertebrals:  Bilateral vertebral arteries demonstrate antegrade flow. Subclavians: Normal flow hemodynamics were seen in bilateral subclavian              arteries.    ASSESSMENT: BRITTIAN RENALDO is a 48 y.o. year old male presented with worsening dysarthria and right facial droop on 07/10/2019 with stroke work-up showing left MCA and ACA scattered infarcts secondary to large vessel disease with left ICA occlusion. Vascular risk factors include tobacco use, CAD with cardiomyopathy and MI post stent, prior stroke, HTN, HLD and medication noncompliance due to loss of insurance.     PLAN:  1. Left MCA/ACA infarcts: Continue clopidogrel 75 mg daily  and atorvastatin for secondary stroke prevention. Maintain strict control of hypertension with blood pressure goal below 130/90, diabetes with hemoglobin A1c goal below 6.5% and cholesterol with LDL  cholesterol (bad cholesterol) goal below 70 mg/dL.  I also advised the patient to eat a healthy diet with plenty of whole grains, cereals, fruits and vegetables, exercise regularly with at least 30 minutes of continuous activity daily and maintain ideal body weight. 2. HTN: Advised to continue current treatment regimen.  Today's BP ***.  Advised to continue to monitor at home along with continued follow-up with PCP for management 3. HLD: Advised to continue current treatment regimen along with continued follow-up with PCP for future prescribing and monitoring of lipid panel 4. Extra and intracranial stenosis/occlusion: Continue on Plavix.  Unable to do DAPT due to aspirin allergy.  Aggressive stroke risk factor modification.  Tobacco cessation.  Medication compliance 5. Tobacco use:     Follow up in *** or call earlier if needed   Greater than 50% of time during this 45 minute visit was spent on counseling, explanation of diagnosis of ***, reviewing risk factor management of ***, planning of further management along with potential future management, and discussion with patient and family answering all questions.    Frann Rider, AGNP-BC  [facility] [address] [place], [place] [address]  Phone [phone] Fax [phone] Note: This document was prepared with digital dictation and possible smart phrase technology. Any transcriptional errors that result from this process are unintentional.

## 2019-10-29 NOTE — Telephone Encounter (Signed)
Patient was a no call/no show for their appointment today.   

## 2019-11-02 ENCOUNTER — Ambulatory Visit (INDEPENDENT_AMBULATORY_CARE_PROVIDER_SITE_OTHER): Payer: Self-pay | Admitting: Cardiology

## 2019-11-02 ENCOUNTER — Encounter: Payer: Self-pay | Admitting: Cardiology

## 2019-11-02 VITALS — Ht 72.0 in | Wt 328.0 lb

## 2019-11-02 DIAGNOSIS — E782 Mixed hyperlipidemia: Secondary | ICD-10-CM

## 2019-11-02 DIAGNOSIS — I5022 Chronic systolic (congestive) heart failure: Secondary | ICD-10-CM

## 2019-11-02 DIAGNOSIS — I2510 Atherosclerotic heart disease of native coronary artery without angina pectoris: Secondary | ICD-10-CM

## 2019-11-02 MED ORDER — CLOPIDOGREL BISULFATE 75 MG PO TABS: 75.0000 mg | ORAL_TABLET | Freq: Every day | ORAL | 3 refills | Status: DC

## 2019-11-02 NOTE — Progress Notes (Signed)
Virtual Visit via Telephone Note   This visit type was conducted due to national recommendations for restrictions regarding the COVID-19 Pandemic (e.g. social distancing) in an effort to limit this patient's exposure and mitigate transmission in our community.  Due to his co-morbid illnesses, this patient is at least at moderate risk for complications without adequate follow up.  This format is felt to be most appropriate for this patient at this time.  The patient did not have access to video technology/had technical difficulties with video requiring transitioning to audio format only (telephone).  All issues noted in this document were discussed and addressed.  No physical exam could be performed with this format.  Please refer to the patient's chart for his  consent to telehealth for [facility]   Date:  11/05/2019   ID:  Zachary Mccann, DOB 05/23/1971, MRN [id number]  Patient Location: Home Provider Location: Office  PCP:  Ailene Ards, NP  Cardiologist:  Carlyle Dolly, MD  Electrophysiologist:  None   Evaluation Performed:  Follow-Up Visit  Chief Complaint:  Follow up  History of Present Illness:    Zachary Mccann is a 48 y.o. male  seen today for follow up of the following medical problems.   1. CAD/ICM  - NSTEMI 04/2018, cath showed occluded LCX. Received DES. LVgram 35-45% - 04/2018 echo LVEF 50-55%, no WMAs, grade I diastolic dysfunction - ASA allergy, hadbeen on brillinta alone. We tried stopping coreg due to fatigue and erectile dysfunction but no improvement in symptoms. No ACE/ARB/ARNI/aldactone due to poor renal function  - 08/2018 had CVA, he was changed from effient to plavix as there is contraindication for effient and CVA - during 08/2018 admission with CVA echo showed LVEF back down to 35-40%.   02/2019 echo LVEF 40-45%  - no recent chest pain. No SOB or DOE. No recent edema    2. HTN -compliant with  meds - does not have bp cuff     3. Hyperlipidemia -08/2018 TC 128 TG 58 HDL 35 LDL 81 - compliant with statin  4. CKD 3 - followed by neprhology Dr Hinda Lenis Form 02/2019 note reports Cr mildly improved to 1.68  5. CVA - admit 08/2018 with left cerebellar stroke.  - no arrhtyhmia detected, TEE benign. 30 day monitor was asked to be arranged today   - recurrent CVA 06/2019. Left MCA and ACA scattered infarct with left ICA occlusion.     The patient does not have symptoms concerning for COVID-19 infection (fever, chills, cough, or new shortness of breath).    Past Medical History:  Diagnosis Date  . CAD (coronary artery disease)    a. s/p NSTEMI in 04/2018 with DES to LCx.   [name] Hypertension   . Myocardial infarction ([place])   . Pneumonia   . Stroke [facility])    Past Surgical History:  Procedure Laterality Date  . CORONARY STENT INTERVENTION N/A 05/05/2018   Procedure: CORONARY STENT INTERVENTION;  Surgeon: Leonie Man, MD;  Location: [place] CV LAB;  Service: Cardiovascular;  Laterality: N/A;  . LEFT HEART CATH AND CORONARY ANGIOGRAPHY N/A 05/05/2018   Procedure: LEFT HEART CATH AND CORONARY ANGIOGRAPHY;  Surgeon: Leonie Man, MD;  Location: [place] CV LAB;  Service: Cardiovascular;  Laterality: N/A;  . TEE WITHOUT CARDIOVERSION N/A 09/08/2018   Procedure: TRANSESOPHAGEAL ECHOCARDIOGRAM (TEE);  Surgeon: Larey Dresser, MD;  Location: [facility] ENDOSCOPY;  Service: Cardiovascular;  Laterality: N/A;     Current Meds  Medication Sig  .  acetaminophen (TYLENOL) 325 MG tablet Take 2 tablets (650 mg total) by mouth every 4 (four) hours as needed for mild pain (or temp > 37.5 C (99.5 F)).  [name] amLODipine (NORVASC) 10 MG tablet Take 1 tablet (10 mg total) by mouth daily.  [name] atorvastatin (LIPITOR) 40 MG tablet Take 1 tablet (40 mg total) by mouth daily at 6 PM.  . carvedilol (COREG) 6.25 MG tablet Take 1 tablet (6.25 mg total) by mouth 2 (two) times daily with a meal.  . diclofenac sodium (VOLTAREN) 1 % GEL  Apply 2 g topically 4 (four) times daily.  . hydrALAZINE (APRESOLINE) 25 MG tablet Take 1 tablet (25 mg total) by mouth 3 (three) times daily.  . Melatonin 3 MG TABS Take 1 tablet (3 mg total) by mouth at bedtime.  . mirtazapine (REMERON) 7.5 MG tablet Take 1 tablet (7.5 mg total) by mouth at bedtime.  . Multiple Vitamin (MULTIVITAMIN WITH MINERALS) TABS tablet Take 1 tablet by mouth daily.  [name] senna-docusate (SENOKOT-S) 8.6-50 MG tablet Take 2 tablets by mouth at bedtime.     Allergies:   Aspirin   Social History   Tobacco Use  . Smoking status: Former Smoker    Types: Cigarettes  . Smokeless tobacco: Never Used  . Tobacco comment: smoked for 10-20 years at 1/2 ppd as of 2019  Substance Use Topics  . Alcohol use: Not Currently  . Drug use: Not Currently     Family Hx: The patient's family history includes Alcoholism in his father; Cancer in his father and sister; Dementia in his mother; Hypertension in his father and mother; Stroke in his father and mother.  ROS:   Please see the history of present illness.     All other systems reviewed and are negative.   Prior CV studies:   The following studies were reviewed today:  04/2018 cath  Mid Cx to Dist Cx lesion is 100% stenosed.  A drug-eluting stent was successfully placed using a STENT SYNERGY DES 3X24. -Postdilated to 3.3 mm  Post intervention, there is a 0% residual stenosis.  LAV [place] lesion is 65% stenosed - In a small Izel Hochberg beyond the stent.  There is moderate left ventricular systolic dysfunction. The left ventricular ejection fraction is 35-45% by visual estimate.  Severe single-vessel disease with occlusion of the distal Cx treated with single DES stent. Moderately reduced LVEF with moderately elevated LVEDP.   POST CATH PLAN Plan: Admit to CCU. Continue IV nitroglycerin overnight.  With aspirin allergy, have changed from Plavix to Brilinta for antiplatelet agent.  Elevated LVEDP, but CKD-3, will  hydrate and give 1 dose of IV Lasix. -Consider diuretic for part of antihypertensive therapy (consider chlorthalidone).  Aggressive blood pressure control, have converted from metoprolol to carvedilol along with amlodipine.-Hold off on ARB/ACE inhibitor until renal function stable post cath  High-dose statin   Check 2D echocardiogram to better assess EF   04/2018 echo Study Conclusions  - Left ventricle: The cavity size was normal. There was moderate concentric hypertrophy. Systolic function was normal. The estimated ejection fraction was in the range of 50% to 55%. Wall motion was normal; there were no regional wall motion abnormalities. Doppler parameters are consistent with abnormal left ventricular relaxation (grade 1 diastolic dysfunction). - Pericardium, extracardiac: A trivial pericardial effusion was identified.   02/2019 echo IMPRESSIONS   1. Stage 1: Stage: Mid and apical inferior wall, basal and mid inferolateral wall, mid anterolateral segment, and apex are abnormal. 2. The cavity size  was mildly dilated. There is mild posterior wall and moderate septal left ventricular hypertrophy. LVEF 40-45%. 3. The aortic root is normal in size and structure. 4. The aortic valve was not well visualized. 5. The right ventricle has normal systolic function. The cavity was normal. There is no increase in right ventricular wall thickness. 6. The mitral valve is grossly normal.   06/2019 echo 1. The left ventricle has a visually estimated ejection fraction of 40%. The cavity size was normal. There is mildly increased left ventricular wall thickness. Left ventricular diastolic Doppler parameters are consistent with impaired relaxation. No  obvious, formed LV thrombus seen with Definity contrast.  2. There is akinesis of the left ventricular, basal-mid inferolateral wall and inferior wall.  3. The right ventricle has normal systolic function. The cavity was  normal. There is no increase in right ventricular wall thickness.  4. The mitral valve is grossly normal.  5. The tricuspid valve is grossly normal.  6. The aortic valve was not well visualized.  7. The aorta is normal in size and structure.  8. The interatrial septum was not well visualized.   Labs/Other Tests and Data Reviewed:    EKG:  No ECG reviewed.  Recent Labs: 08/11/2019: Hemoglobin 12.6; Platelets 251 10/01/2019: ALT 24; BUN 33; Creat 1.65; Potassium 4.4; Sodium 137   Recent Lipid Panel Lab Results  Component Value Date/Time   CHOL 117 10/01/2019 11:12 AM   TRIG 85 10/01/2019 11:12 AM   HDL 40 10/01/2019 11:12 AM   CHOLHDL 2.9 10/01/2019 11:12 AM   LDLCALC 60 10/01/2019 11:12 AM    Wt Readings from Last 3 Encounters:  11/02/19 (!) 328 lb (148.8 kg)  10/01/19 (!) 336 lb (152.4 kg)  08/10/19 (!) 311 lb 11.7 oz (141.4 kg)     Objective:    Vital Signs:  Ht 6' (1.829 m)   Wt (!) 328 lb (148.8 kg)   BMI 44.48 kg/m    Normal affect. Normal speech pattern and tone. Comfortable, no apparent distress. NO audible signs of SOb or wheezing. COmfortable no apparent distress.   ASSESSMENT & PLAN:    1. CAD/ICM/Chronic systolic heart failure -  Medical therapy limited by renal disease - no recent cardiac symptoms, continue current meds. Restart his plavix, unlclear what happened to this in his regimen  2. Hyperlipidemia -copntinue statin  3. HTN -cotinue current meds   COVID-19 Education: The signs and symptoms of COVID-19 were discussed with the patient and how to seek care for testing (follow up with PCP or arrange E-visit).  The importance of social distancing was discussed today.  Time:   Today, I have spent 16 minutes with the patient with telehealth technology discussing the above problems.     Medication Adjustments/Labs and Tests Ordered: Current medicines are reviewed at length with the patient today.  Concerns regarding medicines are outlined  above.   Tests Ordered: No orders of the defined types were placed in this encounter.   Medication Changes: Meds ordered this encounter  Medications  . clopidogrel (PLAVIX) 75 MG tablet    Sig: Take 1 tablet (75 mg total) by mouth daily.    Dispense:  90 tablet    Refill:  3    Follow Up:  In Person in 6 month(s)  Signed, Dina Rich, MD  11/05/2019 10:18 AM

## 2019-11-02 NOTE — Patient Instructions (Signed)
Medication Instructions:  START PLAVIX 75 MG DAILY   Labwork: NONE  Testing/Procedures: NONE  Follow-Up: Your physician wants you to follow-up in: 6 MONTHS.  You will receive a reminder letter in the mail two months in advance. If you don't receive a letter, please call our office to schedule the follow-up appointment.   Any Other Special Instructions Will Be Listed Below (If Applicable).     If you need a refill on your cardiac medications before your next appointment, please call your pharmacy.   

## 2019-11-16 ENCOUNTER — Telehealth (INDEPENDENT_AMBULATORY_CARE_PROVIDER_SITE_OTHER): Payer: Self-pay

## 2019-11-19 NOTE — Telephone Encounter (Signed)
Return call to f/u on phone call request. Zachary Mccann has talked with medical provider on pt case.

## 2019-11-26 ENCOUNTER — Other Ambulatory Visit: Payer: Self-pay

## 2019-11-26 ENCOUNTER — Ambulatory Visit: Payer: Medicaid Other | Admitting: Adult Health

## 2019-11-26 ENCOUNTER — Encounter: Payer: Self-pay | Admitting: Adult Health

## 2019-11-26 ENCOUNTER — Encounter

## 2019-11-26 VITALS — BP 138/74 | HR 92 | Temp 97.9°F | Ht 72.0 in

## 2019-11-26 DIAGNOSIS — R4701 Aphasia: Secondary | ICD-10-CM

## 2019-11-26 DIAGNOSIS — I69351 Hemiplegia and hemiparesis following cerebral infarction affecting right dominant side: Secondary | ICD-10-CM

## 2019-11-26 DIAGNOSIS — I63512 Cerebral infarction due to unspecified occlusion or stenosis of left middle cerebral artery: Secondary | ICD-10-CM

## 2019-11-26 DIAGNOSIS — R471 Dysarthria and anarthria: Secondary | ICD-10-CM

## 2019-11-26 DIAGNOSIS — I6930 Unspecified sequelae of cerebral infarction: Secondary | ICD-10-CM | POA: Diagnosis not present

## 2019-11-26 DIAGNOSIS — I669 Occlusion and stenosis of unspecified cerebral artery: Secondary | ICD-10-CM

## 2019-11-26 DIAGNOSIS — Z9114 Patient's other noncompliance with medication regimen: Secondary | ICD-10-CM

## 2019-11-26 NOTE — Progress Notes (Signed)
[facility] [address] [place] [id number] [phone]       HOSPITAL FOLLOW UP NOTE  Mr. Zachary SkeensShawn T Politte Date of Birth:  03/30/1971 Medical Record Number:  [id number]   Reason for Referral:  hospital stroke follow up    CHIEF COMPLAINT:  Chief Complaint  Patient presents with  . Hospitalization Follow-up    Alone. Treatment room. No new concerns at this time.     HPI: Zachary GlanceShawn T Broadnaxis being seen today for in office hospital follow-up regarding left MCA and ACA scattered infarcts due to left ICA occlusion on 07/10/2019.  History obtained from patient and chart review. Reviewed all radiology images and labs personally.  Mr. Zachary SkeensShawn T Wenzl is a 48 y.o. male with history of ongoing tobacco use, CAD w stent / Mi, hypertension, previous left cerebellar stroke with residual left-sided deficits 08/2018 and non compliant with medications after losing his insurance coverage, who was found to be much more dysarthric with right facial droop  and right-sided weakness.  Stroke work-up revealed left MCA and ACA scattered infarcts as evidenced on MRI due to left ICA occlusion (large vessel disease).  He did not receive IV t-PA due to late presentation (>4.5 hours from time of onset).  MRI showed extensive left hemisphere acute/subacute white matter infarcts involving the left centrum semiovale and corona radiata as well as border zone areas.  MRA showed left ICA occlusion, bilateral VA distal occlusion, BA proximal occlusion with distal retrograde recon from PCOMs.  Carotid Doppler showed left ICA/CCA occlusion.  2D echo showed an EF of 40%.  LDL 90.  A1c 5.6.  UDS positive for THC.  Recommended initiation of Plavix as he has a history of aspirin allergy.  Prior stroke 08/2018 left superior cerebellar infarct as well as evidence of prior strokes.  Hypercoagulable work-up at that time negative.  TEE unremarkable.  30 cardiac event monitor outpatient negative for A. fib.  History  of CAD with cardiomyopathy non-STEMI post stent 04/2018 and medication noncompliance due to financial difficulty.  Current tobacco use of smoking cessation counseling provided.  HTN stable and recommended long-term BP goal 130-150 due to left ICA occlusion.  Initiated atorvastatin 40 mg daily.  Other stroke risk factors include intracranial stenosis, previous EtOH use and prior drug use, THC use, obesity, family history of stroke and medication noncompliance.  Residual deficits of dysphagia, expressive aphasia and right hemiparesis and discharged to CIR for ongoing therapies.  He was discharged home with recommendation of ongoing therapies on 08/12/2019 with residual left hemiparesis, right hemiparesis, dysphagia and aphasia.  [name] is a 48 year old male who is being seen today for hospital follow-up.  Residual deficits from most recent stroke include right hemiparesis, and severe expressive aphasia without improvement.  He denies residual dysphagia concerns.  He also continues to have residual left hemiparesis from prior stroke.  He is primarily nonverbal but is able to yes and no appropriately.  Difficulty obtaining large amount of information due to communication barrier but able to ask yes/no questions with appropriate responses.  After inpatient rehab discharge, he was recommended for patient to receive home health therapies but he denies receiving any type of therapies at this time.  He is currently living in his own home with his girlfriend who helps assist with daily needs.  He is nonambulatory and transfers via wheelchair.  He has continued on Plavix without bleeding or bruising.  Has continued on atorvastatin 40 mg daily without myalgias.  Blood pressure today  138/74.  He continues to follow with cardiology regularly.  No further concerns at this time.     ROS:   14 system review of systems performed and negative with exception of speech difficulty, gait impairment, weakness  PMH:  Past  Medical History:  Diagnosis Date  . CAD (coronary artery disease)    a. s/p NSTEMI in 04/2018 with DES to LCx.   [name]. Hypertension   . Myocardial infarction (HCC)   . Pneumonia   . Stroke [facility](HCC)     PSH:  Past Surgical History:  Procedure Laterality Date  . CORONARY STENT INTERVENTION N/A 05/05/2018   Procedure: CORONARY STENT INTERVENTION;  Surgeon: Marykay LexHarding, David W, MD;  [facility];  Service: Cardiovascular;  Laterality: N/A;  . LEFT HEART CATH AND CORONARY ANGIOGRAPHY N/A 05/05/2018   Procedure: LEFT HEART CATH AND CORONARY ANGIOGRAPHY;  Surgeon: Marykay LexHarding, David W, MD;  [facility];  Service: Cardiovascular;  Laterality: N/A;  . TEE WITHOUT CARDIOVERSION N/A 09/08/2018   Procedure: TRANSESOPHAGEAL ECHOCARDIOGRAM (TEE);  Surgeon: Laurey MoraleMcLean, Dalton S, MD;  [facility];  Service: Cardiovascular;  Laterality: N/A;    Social History:  Social History   Socioeconomic History  . Marital status: Single    Spouse name: Not on file  . Number of children: Not on file  . Years of education: Not on file  . Highest education level: Not on file  Occupational History  . Not on file  Tobacco Use  . Smoking status: Former Smoker    Types: Cigarettes  . Smokeless tobacco: Never Used  . Tobacco comment: smoked for 10-20 years at 1/2 ppd as of 2019  Substance and Sexual Activity  . Alcohol use: Not Currently  . Drug use: Not Currently  . Sexual activity: Yes  Other Topics Concern  . 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 Has girlfriend. Lives with girlfriend.   Social Determinants of Health   Financial Resource Strain:   . Difficulty of Paying Living Expenses: Not on file  Food Insecurity:   . Worried About Programme researcher, broadcasting/film/videounning Out of Food in the Last Year: Not on file  . Ran Out of Food in the Last Year: Not on file  Transportation Needs:   . Lack of Transportation (Medical):  Not on file  . Lack of Transportation (Non-Medical): Not on file  Physical Activity:   . Days of Exercise per Week: Not on file  . Minutes of Exercise per Session: Not on file  Stress:   . Feeling of Stress : Not on file  Social Connections:   . Frequency of Communication with Friends and Family: Not on file  . Frequency of Social Gatherings with Friends and Family: Not on file  . Attends Religious Services: Not on file  . Active Member of Clubs or Organizations: Not on file  . Attends BankerClub or Organization Meetings: Not on file  . Marital Status: Not on file  Intimate Partner Violence:   . Fear of Current or Ex-Partner: Not on file  . Emotionally Abused: Not on file  . Physically Abused: Not on file  . Sexually Abused: Not on file    Family History:  Family History  Problem Relation Age of Onset  . Hypertension Mother   . Stroke Mother   . Dementia Mother   . Hypertension Father   . Stroke Father   . Cancer Father   .  Alcoholism Father   . Cancer Sister     Medications:   Current Outpatient Medications on File Prior to Visit  Medication Sig Dispense Refill  . amLODipine (NORVASC) 10 MG tablet Take 1 tablet (10 mg total) by mouth daily. 30 tablet 0  . atorvastatin (LIPITOR) 40 MG tablet Take 1 tablet (40 mg total) by mouth daily at 6 PM. 30 tablet 0  . carvedilol (COREG) 6.25 MG tablet Take 1 tablet (6.25 mg total) by mouth 2 (two) times daily with a meal. 60 tablet 0  . clopidogrel (PLAVIX) 75 MG tablet Take 1 tablet (75 mg total) by mouth daily. 90 tablet 3  . hydrALAZINE (APRESOLINE) 25 MG tablet Take 1 tablet (25 mg total) by mouth 3 (three) times daily. 90 tablet 0  . Melatonin 10 MG TABS Take 10 mg by mouth at bedtime.    . mirtazapine (REMERON) 7.5 MG tablet Take 1 tablet (7.5 mg total) by mouth at bedtime. 30 tablet 0  . Multiple Vitamin (MULTIVITAMIN WITH MINERALS) TABS tablet Take 1 tablet by mouth daily.  0  . acetaminophen (TYLENOL) 325 MG tablet Take 2 tablets  (650 mg total) by mouth every 4 (four) hours as needed for mild pain (or temp > 37.5 C (99.5 F)). (Patient not taking: Reported on 11/26/2019)    . diclofenac sodium (VOLTAREN) 1 % GEL Apply 2 g topically 4 (four) times daily. (Patient not taking: Reported on 11/26/2019) 2 g 0  . senna-docusate (SENOKOT-S) 8.6-50 MG tablet Take 2 tablets by mouth at bedtime. (Patient not taking: Reported on 11/26/2019)     No current facility-administered medications on file prior to visit.    Allergies:   Allergies  Allergen Reactions  . Aspirin Swelling     Physical Exam  Vitals:   11/26/19 1053  BP: 138/74  Pulse: 92  Temp: 97.9 F (36.6 C)  TempSrc: Oral  Height: 6' (1.829 m)   Body mass index is 44.48 kg/m. No exam data present  General: Pleasant morbidly obese middle-aged African-American male, seated, in no evident distress Head: head normocephalic and atraumatic.   Neck: supple with no carotid or supraclavicular bruits Cardiovascular: regular rate and rhythm, no murmurs Musculoskeletal: no deformity Skin:  no rash/petichiae Vascular:  Normal pulses all extremities   Neurologic Exam Mental Status: Awake and fully alert.   Severe expressive aphasia and severe dysarthria with attempts at speech but and intangible words.  Not able to name or repeat.  Oriented to place and time with choices and nodding yes/no appropriately to questions.  Unable to fully assess cognition due to communication deficit but appears intact.  Mood and affect appropriate.  Cranial Nerves: Fundoscopic exam reveals sharp disc margins. Pupils equal, briskly reactive to light. Extraocular movements full without nystagmus. Visual fields full to confrontation. Hearing intact. Facial sensation intact.  Right lower facial droop. Motor: Normal bulk and tone.  RUE: 3/5 with weak grip strength and increased tone RLE: 3 -/5 hip flexor, knee extension and flexion and 4/5 dorsiflexion LUE and LLE: 4/5 Sensory.: intact to  touch , pinprick , position and vibratory sensation.  Coordination: Rapid alternating movements diminished R>L. Finger-to-nose and heel-to-shin unable to perform adequately due to weakness Gait and Station: Deferred as patient nonambulatory Reflexes: 1+ and symmetric. Toes downgoing.     NIHSS  13 Modified Rankin  3-4    Diagnostic Data (Labs, Imaging, Testing)  CT HEAD WO CONTRAST 07/10/2019 IMPRESSION: Area of prior infarction in the superior aspect of the left  cerebellum. Areas of prior ischemia are noted. The changes on the left are new from the prior exam but consistent with more subacute to chronic ischemia. No evidence of acute hemorrhage noted.  [name] MR MRA HEAD  07/11/2019 IMPRESSION: 1. Occluded left internal carotid artery with reconstitution of the level of the left posterior communicating artery. Of note, there is narrowing within the left posterior communicating artery. 2. No significant stenosis or occlusion of the right internal carotid artery with a prominent right posterior communicating artery that is feeding the posterior circulation. 3. High-grade stenosis or occlusion of distal vertebral arteries bilaterally and of the proximal basilar artery. Flow is reconstituted in the distal basilar artery, presumably through the right posterior communicating artery. 4. Asymmetric attenuation of left MCA branch vessels without a focal stenosis or protrusion. This likely to represents the proximal occlusion decreased perfusion pressure due. 5. Extensive left hemisphere acute/subacute white matter infarcts involving the left centrum semi ovale and corona radiata as well as border zone areas. This corresponds with the proximal left ICA occlusion. 6. There are scattered punctate foci of cortical infarct involving the anterior left frontal lobe and left parietal lobe. There is also some involvement of the cingulate gyrus.  ECHOCARDIOGRAM 07/11/2019  IMPRESSIONS  1. The left ventricle has a visually estimated ejection fraction of 40%. The cavity size was normal. There is mildly increased left ventricular wall thickness. Left ventricular diastolic Doppler parameters are consistent with impaired relaxation. No  obvious, formed LV thrombus seen with Definity contrast.  2. There is akinesis of the left ventricular, basal-mid inferolateral wall and inferior wall.  3. The right ventricle has normal systolic function. The cavity was normal. There is no increase in right ventricular wall thickness.  4. The mitral valve is grossly normal.  5. The tricuspid valve is grossly normal.  6. The aortic valve was not well visualized.  7. The aorta is normal in size and structure.  8. The interatrial septum was not well visualized.  VAS US CAROTID DUPLEX BILATERAL 07/11/2019 Summary: Right Carotid: The extracranial vessels were near-normal with only minimal wall                thickening or plaque. Left Carotid: The CCA appears occluded. Vertebrals:  Bilateral vertebral arteries demonstrate antegrade flow. Subclavians: Normal flow hemodynamics were seen in bilateral subclavian              arteries.    ASSESSMENT: Zachary Mccann is a 48 y.o. year old male presented with worsening dysarthria and right facial droop on 07/10/2019 with stroke work-up showing left MCA and ACA scattered infarcts secondary to large vessel disease with left ICA occlusion. Vascular risk factors include tobacco use, CAD with cardiomyopathy and MI post stent, prior stroke with residual left hemiparesis, HTN, HLD and medication noncompliance due to loss of insurance.  Recent stroke residual deficits of right hemiparesis, right facial droop, severe aphasia and severe dysarthria    PLAN:  1. Left MCA/ACA infarcts: Continue clopidogrel 75 mg daily  and atorvastatin for secondary stroke prevention. Maintain strict control of hypertension with blood pressure goal below 130/90,  diabetes with hemoglobin A1c goal below 6.5% and cholesterol with LDL cholesterol (bad cholesterol) goal below 70 mg/dL.  I also advised the patient to eat a healthy diet with plenty of whole grains, cereals, fruits and vegetables, exercise regularly with at least 30 minutes of continuous activity daily and maintain ideal body weight. 2. Post stroke deficits: Referral placed for home  health nursing PT/OT/ST for possible improvement of residual deficits 3. HTN: Advised to continue current treatment regimen.  Today's BP stable.  Advised to continue to monitor at home along with continued follow-up with PCP for management 4. HLD: Advised to continue current treatment regimen along with continued follow-up with PCP for future prescribing and monitoring of lipid panel 5. Extra and intracranial stenosis/occlusion: Continue on Plavix.  Unable to do DAPT due to aspirin allergy.  Aggressive stroke risk factor modification.  Tobacco cessation.  Medication compliance 6. Tobacco use: Endorses cessation and highly encouraged continuation 7. CAD: Continue to follow with cardiology for ongoing monitoring and management    Follow up in 3 months or call earlier if needed   Greater than 50% of time during this 45 minute visit was spent on counseling, explanation of diagnosis of left MCA/ACA infarcts, discussion regarding residual deficits and importance of initiating therapy, reviewing risk factor management of HTN, HLD, extracranial stenosis/occlusion, CAD and prior tobacco use, discussion regarding importance of medication compliance, planning of further management along with potential future management, and discussion with patient with review of further plan    Ihor Austin, [facility]  [facility] [address] [place], [place] [address]  Phone [phone] Fax [phone] Note: This document was prepared with digital dictation and possible smart phrase technology. Any  transcriptional errors that result from this process are unintentional.

## 2019-11-26 NOTE — Progress Notes (Signed)
I agree with the above plan 

## 2019-11-26 NOTE — Patient Instructions (Signed)
Will place referral for you to start to receive home health therapies including physical, occupational and speech therapy  Recommend scheduling follow-up visit with physical medicine and rehab as recommended at discharge in regards to inpatient rehab admission  Continue to follow with cardiologist as scheduled  Continue clopidogrel 75 mg daily  and Lipitor for secondary stroke prevention  Continue to follow up with PCP regarding cholesterol and blood pressure management   Continue to monitor blood pressure at home  Maintain strict control of hypertension with blood pressure goal below 130/90, diabetes with hemoglobin A1c goal below 6.5% and cholesterol with LDL cholesterol (bad cholesterol) goal below 70 mg/dL. I also advised the patient to eat a healthy diet with plenty of whole grains, cereals, fruits and vegetables, exercise regularly and maintain ideal body weight.  Followup in the future with me in 3 months or call earlier if needed       Thank you for coming to see [place] at [facility]. I hope we have been able to provide you high quality care today.  You may receive a patient satisfaction survey over the next few weeks. We would appreciate your feedback and comments so that we may continue to improve ourselves and the health of our patients.

## 2019-12-27 ENCOUNTER — Other Ambulatory Visit (INDEPENDENT_AMBULATORY_CARE_PROVIDER_SITE_OTHER): Payer: Self-pay | Admitting: Nurse Practitioner

## 2019-12-27 DIAGNOSIS — I63512 Cerebral infarction due to unspecified occlusion or stenosis of left middle cerebral artery: Secondary | ICD-10-CM

## 2019-12-27 DIAGNOSIS — I10 Essential (primary) hypertension: Secondary | ICD-10-CM

## 2019-12-27 DIAGNOSIS — K5900 Constipation, unspecified: Secondary | ICD-10-CM

## 2019-12-27 DIAGNOSIS — G479 Sleep disorder, unspecified: Secondary | ICD-10-CM

## 2020-01-07 DIAGNOSIS — Z0271 Encounter for disability determination: Secondary | ICD-10-CM

## 2020-02-23 ENCOUNTER — Other Ambulatory Visit (INDEPENDENT_AMBULATORY_CARE_PROVIDER_SITE_OTHER): Payer: Self-pay | Admitting: Nurse Practitioner

## 2020-02-23 DIAGNOSIS — I10 Essential (primary) hypertension: Secondary | ICD-10-CM

## 2020-02-23 DIAGNOSIS — I63512 Cerebral infarction due to unspecified occlusion or stenosis of left middle cerebral artery: Secondary | ICD-10-CM

## 2020-02-23 DIAGNOSIS — K5900 Constipation, unspecified: Secondary | ICD-10-CM

## 2020-02-23 DIAGNOSIS — G479 Sleep disorder, unspecified: Secondary | ICD-10-CM

## 2020-02-28 ENCOUNTER — Other Ambulatory Visit (INDEPENDENT_AMBULATORY_CARE_PROVIDER_SITE_OTHER): Payer: Self-pay | Admitting: Nurse Practitioner

## 2020-02-28 DIAGNOSIS — I10 Essential (primary) hypertension: Secondary | ICD-10-CM

## 2020-02-28 DIAGNOSIS — G479 Sleep disorder, unspecified: Secondary | ICD-10-CM

## 2020-02-28 DIAGNOSIS — I63512 Cerebral infarction due to unspecified occlusion or stenosis of left middle cerebral artery: Secondary | ICD-10-CM

## 2020-02-28 DIAGNOSIS — K5900 Constipation, unspecified: Secondary | ICD-10-CM

## 2020-03-10 ENCOUNTER — Ambulatory Visit: Payer: Medicaid Other | Admitting: Adult Health

## 2020-03-10 ENCOUNTER — Encounter: Payer: Self-pay | Admitting: Adult Health

## 2020-03-10 NOTE — Progress Notes (Deleted)
[facility] [address] [place]. [place] [address] [phone]       STROKE FOLLOW UP NOTE  [name] Date of Birth:  1971-01-27 Medical Record Number:  [id number]   Reason for Referral:  stroke follow up    CHIEF COMPLAINT:  No chief complaint on file.   HPI:  Zachary Mccann is a 49 year old male who is being seen today, 03/10/2020, for left MCA/ACA stroke in 06/2019.  Residual deficits of right hemiparesis and expressive aphasia ***.  Continues on clopidogrel and atorvastatin for secondary stroke prevention without side effects.  Blood pressure today ***.  No concerns at this time.     History: Provided for reference purposes only Stroke admission 07/10/2019: Zachary Mccann is a 49 y.o. male with history of ongoing tobacco use, CAD w stent / Mi, hypertension, previous left cerebellar stroke with residual left-sided deficits 08/2018 and non compliant with medications after losing his insurance coverage, who was found to be much more dysarthric with right facial droop  and right-sided weakness.  Stroke work-up revealed left MCA and ACA scattered infarcts as evidenced on MRI due to left ICA occlusion (large vessel disease).  He did not receive IV t-PA due to late presentation (>4.5 hours from time of onset).  MRI showed extensive left hemisphere acute/subacute white matter infarcts involving the left centrum semiovale and corona radiata as well as border zone areas.  MRA showed left ICA occlusion, bilateral VA distal occlusion, BA proximal occlusion with distal retrograde recon from PCOMs.  Carotid Doppler showed left ICA/CCA occlusion.  2D echo showed an EF of 40%.  LDL 90.  A1c 5.6.  UDS positive for THC.  Recommended initiation of Plavix as he has a history of aspirin allergy.  Prior stroke 08/2018 left superior cerebellar infarct as well as evidence of prior strokes.  Hypercoagulable work-up at that time negative.  TEE unremarkable.  30 cardiac event  monitor outpatient negative for A. fib.  History of CAD with cardiomyopathy non-STEMI post stent 04/2018 and medication noncompliance due to financial difficulty.  Current tobacco use of smoking cessation counseling provided.  HTN stable and recommended long-term BP goal 130-150 due to left ICA occlusion.  Initiated atorvastatin 40 mg daily.  Other stroke risk factors include intracranial stenosis, previous EtOH use and prior drug use, THC use, obesity, family history of stroke and medication noncompliance.  Residual deficits of dysphagia, expressive aphasia and right hemiparesis and discharged to CIR for ongoing therapies.  He was discharged home with recommendation of ongoing therapies on 08/12/2019 with residual left hemiparesis, right hemiparesis, dysphagia and aphasia.  Initial visit 11/26/2019 JM: Zachary Mccann is a 49 year old male who is being seen today for hospital follow-up.  Residual deficits from most recent stroke include right hemiparesis, and severe expressive aphasia without improvement.  He denies residual dysphagia concerns.  He also continues to have residual left hemiparesis from prior stroke.  He is primarily nonverbal but is able to yes and no appropriately.  Difficulty obtaining large amount of information due to communication barrier but able to ask yes/no questions with appropriate responses.  After inpatient rehab discharge, he was recommended for patient to receive home health therapies but he denies receiving any type of therapies at this time.  He is currently living in his own home with his girlfriend who helps assist with daily needs.  He is nonambulatory and transfers via wheelchair.  He has continued on Plavix without bleeding or bruising.  Has continued on atorvastatin  40 mg daily without myalgias.  Blood pressure today 138/74.  He continues to follow with cardiology regularly.  No further concerns at this time.     ROS:   14 system review of systems performed and negative  with exception of speech difficulty, gait impairment, weakness  PMH:  Past Medical History:  Diagnosis Date  . CAD (coronary artery disease)    a. s/p NSTEMI in 04/2018 with DES to LCx.   [name] Hypertension   . Myocardial infarction (HCC)   . Pneumonia   . Stroke [facility])     PSH:  Past Surgical History:  Procedure Laterality Date  . CORONARY STENT INTERVENTION N/A 05/05/2018   Procedure: CORONARY STENT INTERVENTION;  Surgeon: Marykay Lex, MD;  Location: [facility] INVASIVE CV LAB;  Service: Cardiovascular;  Laterality: N/A;  . LEFT HEART CATH AND CORONARY ANGIOGRAPHY N/A 05/05/2018   Procedure: LEFT HEART CATH AND CORONARY ANGIOGRAPHY;  Surgeon: Marykay Lex, MD;  Location: [facility] INVASIVE CV LAB;  Service: Cardiovascular;  Laterality: N/A;  . TEE WITHOUT CARDIOVERSION N/A 09/08/2018   Procedure: TRANSESOPHAGEAL ECHOCARDIOGRAM (TEE);  Surgeon: Laurey Morale, MD;  Location: [facility] ENDOSCOPY;  Service: Cardiovascular;  Laterality: N/A;    Social History:  Social History   Socioeconomic History  . Marital status: Single    Spouse name: Not on file  . Number of children: Not on file  . Years of education: Not on file  . Highest education level: Not on file  Occupational History  . Not on file  Tobacco Use  . Smoking status: Former Smoker    Types: Cigarettes  . Smokeless tobacco: Never Used  . Tobacco comment: smoked for 10-20 years at 1/2 ppd as of 2019  Substance and Sexual Activity  . Alcohol use: Not Currently  . Drug use: Not Currently  . Sexual activity: Yes  Other Topics Concern  . 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 Lives with girlfriend.   Social Determinants of Health   Financial Resource Strain:   . Difficulty of Paying Living Expenses:   Food Insecurity:   . Worried About Programme researcher, broadcasting/film/video in the Last Year:   . Barista in the Last Year:   Transportation  Needs:   . Freight forwarder (Medical):   [name] Lack of Transportation (Non-Medical):   Physical Activity:   . Days of Exercise per Week:   . Minutes of Exercise per Session:   Stress:   . Feeling of Stress :   Social Connections:   . Frequency of Communication with Friends and Family:   . Frequency of Social Gatherings with Friends and Family:   . Attends Religious Services:   . Active Member of Clubs or Organizations:   . Attends Banker Meetings:   [name] Marital Status:   Intimate Partner Violence:   . Fear of Current or Ex-Partner:   . Emotionally Abused:   [name] Physically Abused:   . Sexually Abused:     Family History:  Family History  Problem Relation Age of Onset  . Hypertension Mother   . Stroke Mother   . Dementia Mother   . Hypertension Father   . Stroke Father   . Cancer Father   . Alcoholism Father   . Cancer Sister     Medications:   Current Outpatient Medications on File Prior to Visit  Medication Sig Dispense Refill  .  acetaminophen (TYLENOL) 325 MG tablet Take 2 tablets (650 mg total) by mouth every 4 (four) hours as needed for mild pain (or temp > 37.5 C (99.5 F)). (Patient not taking: Reported on 11/26/2019)    . amLODipine (NORVASC) 10 MG tablet Take 1 tablet by mouth once daily 90 tablet 0  . atorvastatin (LIPITOR) 40 MG tablet TAKE 1 TABLET BY MOUTH ONCE DAILY AT  6:00  PM 90 tablet 0  . carvedilol (COREG) 6.25 MG tablet TAKE 1 TABLET BY MOUTH TWICE DAILY WITH MEALS 180 tablet 0  . clopidogrel (PLAVIX) 75 MG tablet Take 1 tablet (75 mg total) by mouth daily. 90 tablet 3  . diclofenac sodium (VOLTAREN) 1 % GEL Apply 2 g topically 4 (four) times daily. (Patient not taking: Reported on 11/26/2019) 2 g 0  . hydrALAZINE (APRESOLINE) 25 MG tablet TAKE 1 TABLET BY MOUTH THREE TIMES DAILY 90 tablet 3  . Melatonin 10 MG TABS Take 10 mg by mouth at bedtime.    . mirtazapine (REMERON) 7.5 MG tablet Take 1 tablet (7.5 mg total) by mouth at bedtime. 30  tablet 0  . Multiple Vitamin (MULTIVITAMIN WITH MINERALS) TABS tablet Take 1 tablet by mouth daily.  0  . senna-docusate (SENOKOT-S) 8.6-50 MG tablet Take 2 tablets by mouth at bedtime. (Patient not taking: Reported on 11/26/2019)    . [DISCONTINUED] amLODipine (NORVASC) 10 MG tablet Take 1 tablet (10 mg total) by mouth daily. 30 tablet 0  . [DISCONTINUED] atorvastatin (LIPITOR) 40 MG tablet Take 1 tablet (40 mg total) by mouth daily at 6 PM. 30 tablet 0  . [DISCONTINUED] carvedilol (COREG) 6.25 MG tablet Take 1 tablet (6.25 mg total) by mouth 2 (two) times daily with a meal. 60 tablet 0   No current facility-administered medications on file prior to visit.    Allergies:   Allergies  Allergen Reactions  . Aspirin Swelling     Physical Exam  There were no vitals filed for this visit. There is no height or weight on file to calculate BMI. No exam data present  General: Pleasant morbidly obese middle-aged African-American male, seated, in no evident distress Head: head normocephalic and atraumatic.   Neck: supple with no carotid or supraclavicular bruits Cardiovascular: regular rate and rhythm, no murmurs Musculoskeletal: no deformity Skin:  no rash/petichiae Vascular:  Normal pulses all extremities   Neurologic Exam Mental Status: Awake and fully alert.   Severe expressive aphasia and severe dysarthria with attempts at speech but and intangible words.  Not able to name or repeat.  Oriented to place and time with choices and nodding yes/no appropriately to questions.  Unable to fully assess cognition due to communication deficit but appears intact.  Mood and affect appropriate.  Cranial Nerves: Fundoscopic exam reveals sharp disc margins. Pupils equal, briskly reactive to light. Extraocular movements full without nystagmus. Visual fields full to confrontation. Hearing intact. Facial sensation intact.  Right lower facial droop. Motor: Normal bulk and tone.  RUE: 3/5 with weak grip  strength and increased tone RLE: 3 -/5 hip flexor, knee extension and flexion and 4/5 dorsiflexion LUE and LLE: 4/5 Sensory.: intact to touch , pinprick , position and vibratory sensation.  Coordination: Rapid alternating movements diminished R>L. Finger-to-nose and heel-to-shin unable to perform adequately due to weakness Gait and Station: Deferred as patient nonambulatory Reflexes: 1+ and symmetric. Toes downgoing.     NIHSS  13 Modified Rankin  3-4    Diagnostic Data (Labs, Imaging, Testing)  CT HEAD WO  CONTRAST 07/10/2019 IMPRESSION: Area of prior infarction in the superior aspect of the left cerebellum. Areas of prior ischemia are noted. The changes on the left are new from the prior exam but consistent with more subacute to chronic ischemia. No evidence of acute hemorrhage noted.  [name] MR MRA HEAD  07/11/2019 IMPRESSION: 1. Occluded left internal carotid artery with reconstitution of the level of the left posterior communicating artery. Of note, there is narrowing within the left posterior communicating artery. 2. No significant stenosis or occlusion of the right internal carotid artery with a prominent right posterior communicating artery that is feeding the posterior circulation. 3. High-grade stenosis or occlusion of distal vertebral arteries bilaterally and of the proximal basilar artery. Flow is reconstituted in the distal basilar artery, presumably through the right posterior communicating artery. 4. Asymmetric attenuation of left MCA branch vessels without a focal stenosis or protrusion. This likely to represents the proximal occlusion decreased perfusion pressure due. 5. Extensive left hemisphere acute/subacute white matter infarcts involving the left centrum semi ovale and corona radiata as well as border zone areas. This corresponds with the proximal left ICA occlusion. 6. There are scattered punctate foci of cortical infarct  involving the anterior left frontal lobe and left parietal lobe. There is also some involvement of the cingulate gyrus.  ECHOCARDIOGRAM 07/11/2019 IMPRESSIONS  1. The left ventricle has a visually estimated ejection fraction of 40%. The cavity size was normal. There is mildly increased left ventricular wall thickness. Left ventricular diastolic Doppler parameters are consistent with impaired relaxation. No  obvious, formed LV thrombus seen with Definity contrast.  2. There is akinesis of the left ventricular, basal-mid inferolateral wall and inferior wall.  3. The right ventricle has normal systolic function. The cavity was normal. There is no increase in right ventricular wall thickness.  4. The mitral valve is grossly normal.  5. The tricuspid valve is grossly normal.  6. The aortic valve was not well visualized.  7. The aorta is normal in size and structure.  8. The interatrial septum was not well visualized.  VAS US CAROTID DUPLEX BILATERAL 07/11/2019 Summary: Right Carotid: The extracranial vessels were near-normal with only minimal wall                thickening or plaque. Left Carotid: The CCA appears occluded. Vertebrals:  Bilateral vertebral arteries demonstrate antegrade flow. Subclavians: Normal flow hemodynamics were seen in bilateral subclavian              arteries.    ASSESSMENT: Zachary Mccann is a 49 y.o. year old male presented with worsening dysarthria and right facial droop on 07/10/2019 with stroke work-up showing left MCA and ACA scattered infarcts secondary to large vessel disease with left ICA occlusion. Vascular risk factors include tobacco use, CAD with cardiomyopathy and MI post stent, prior stroke with residual left hemiparesis, HTN, HLD and medication noncompliance due to loss of insurance.  Recent stroke residual deficits of right hemiparesis, right facial droop, severe aphasia and severe dysarthria    PLAN:  1. Left MCA/ACA infarcts: Continue  clopidogrel 75 mg daily  and atorvastatin for secondary stroke prevention. Maintain strict control of hypertension with blood pressure goal below 130/90, diabetes with hemoglobin A1c goal below 6.5% and cholesterol with LDL cholesterol (bad cholesterol) goal below 70 mg/dL.  I also advised the patient to eat a healthy diet with plenty of whole grains, cereals, fruits and vegetables, exercise regularly with at least 30 minutes of continuous activity  daily and maintain ideal body weight. 2. Post stroke deficits: Referral placed for home health nursing PT/OT/ST for possible improvement of residual deficits 3. HTN: Advised to continue current treatment regimen.  Today's BP stable.  Advised to continue to monitor at home along with continued follow-up with PCP for management 4. HLD: Advised to continue current treatment regimen along with continued follow-up with PCP for future prescribing and monitoring of lipid panel 5. Extra and intracranial stenosis/occlusion: Continue on Plavix.  Unable to do DAPT due to aspirin allergy.  Aggressive stroke risk factor modification.  Tobacco cessation.  Medication compliance 6. Tobacco use: Endorses cessation and highly encouraged continuation 7. CAD: Continue to follow with cardiology for ongoing monitoring and management    Follow up in 3 months or call earlier if needed   Greater than 50% of time during this 45 minute visit was spent on counseling, explanation of diagnosis of left MCA/ACA infarcts, discussion regarding residual deficits and importance of initiating therapy, reviewing risk factor management of HTN, HLD, extracranial stenosis/occlusion, CAD and prior tobacco use, discussion regarding importance of medication compliance, planning of further management along with potential future management, and discussion with patient with review of further plan    Zachary Mccann, [facility]  [facility] [address] [place], [place]  [address]  Phone [phone] Fax [phone] Note: This document was prepared with digital dictation and possible smart phrase technology. Any transcriptional errors that result from this process are unintentional.

## 2020-03-11 ENCOUNTER — Inpatient Hospital Stay (HOSPITAL_COMMUNITY)
Admission: EM | Admit: 2020-03-11 | Discharge: 2020-03-16 | DRG: 057 | Disposition: A | Discharge status: Rehabilitation Facility | Payer: Medicaid Other | Attending: Internal Medicine | Admitting: Internal Medicine

## 2020-03-11 ENCOUNTER — Other Ambulatory Visit (INDEPENDENT_AMBULATORY_CARE_PROVIDER_SITE_OTHER): Payer: Self-pay | Admitting: Nurse Practitioner

## 2020-03-11 ENCOUNTER — Emergency Department (HOSPITAL_COMMUNITY): Payer: Medicaid Other

## 2020-03-11 ENCOUNTER — Other Ambulatory Visit: Payer: Self-pay

## 2020-03-11 ENCOUNTER — Inpatient Hospital Stay (HOSPITAL_COMMUNITY): Payer: Medicaid Other

## 2020-03-11 ENCOUNTER — Encounter (HOSPITAL_COMMUNITY): Payer: Self-pay | Admitting: Emergency Medicine

## 2020-03-11 DIAGNOSIS — R946 Abnormal results of thyroid function studies: Secondary | ICD-10-CM | POA: Diagnosis present

## 2020-03-11 DIAGNOSIS — I130 Hypertensive heart and chronic kidney disease with heart failure and stage 1 through stage 4 chronic kidney disease, or unspecified chronic kidney disease: Secondary | ICD-10-CM | POA: Diagnosis present

## 2020-03-11 DIAGNOSIS — Z8673 Personal history of transient ischemic attack (TIA), and cerebral infarction without residual deficits: Secondary | ICD-10-CM | POA: Diagnosis not present

## 2020-03-11 DIAGNOSIS — Z87891 Personal history of nicotine dependence: Secondary | ICD-10-CM

## 2020-03-11 DIAGNOSIS — I69328 Other speech and language deficits following cerebral infarction: Secondary | ICD-10-CM

## 2020-03-11 DIAGNOSIS — I5030 Unspecified diastolic (congestive) heart failure: Secondary | ICD-10-CM | POA: Diagnosis present

## 2020-03-11 DIAGNOSIS — R29702 NIHSS score 2: Secondary | ICD-10-CM | POA: Diagnosis present

## 2020-03-11 DIAGNOSIS — Z8249 Family history of ischemic heart disease and other diseases of the circulatory system: Secondary | ICD-10-CM

## 2020-03-11 DIAGNOSIS — E785 Hyperlipidemia, unspecified: Secondary | ICD-10-CM | POA: Diagnosis present

## 2020-03-11 DIAGNOSIS — M25561 Pain in right knee: Secondary | ICD-10-CM | POA: Diagnosis not present

## 2020-03-11 DIAGNOSIS — R4701 Aphasia: Secondary | ICD-10-CM | POA: Diagnosis not present

## 2020-03-11 DIAGNOSIS — I639 Cerebral infarction, unspecified: Secondary | ICD-10-CM | POA: Diagnosis present

## 2020-03-11 DIAGNOSIS — G8191 Hemiplegia, unspecified affecting right dominant side: Secondary | ICD-10-CM | POA: Diagnosis not present

## 2020-03-11 DIAGNOSIS — R531 Weakness: Secondary | ICD-10-CM

## 2020-03-11 DIAGNOSIS — F801 Expressive language disorder: Secondary | ICD-10-CM | POA: Diagnosis not present

## 2020-03-11 DIAGNOSIS — N183 Chronic kidney disease, stage 3 unspecified: Secondary | ICD-10-CM | POA: Diagnosis not present

## 2020-03-11 DIAGNOSIS — Z7902 Long term (current) use of antithrombotics/antiplatelets: Secondary | ICD-10-CM

## 2020-03-11 DIAGNOSIS — I69391 Dysphagia following cerebral infarction: Secondary | ICD-10-CM | POA: Diagnosis not present

## 2020-03-11 DIAGNOSIS — R471 Dysarthria and anarthria: Secondary | ICD-10-CM | POA: Diagnosis present

## 2020-03-11 DIAGNOSIS — I69351 Hemiplegia and hemiparesis following cerebral infarction affecting right dominant side: Secondary | ICD-10-CM

## 2020-03-11 DIAGNOSIS — I5042 Chronic combined systolic (congestive) and diastolic (congestive) heart failure: Secondary | ICD-10-CM | POA: Diagnosis present

## 2020-03-11 DIAGNOSIS — I6930 Unspecified sequelae of cerebral infarction: Secondary | ICD-10-CM | POA: Diagnosis not present

## 2020-03-11 DIAGNOSIS — Z20822 Contact with and (suspected) exposure to covid-19: Secondary | ICD-10-CM | POA: Diagnosis present

## 2020-03-11 DIAGNOSIS — E860 Dehydration: Secondary | ICD-10-CM | POA: Diagnosis present

## 2020-03-11 DIAGNOSIS — K5900 Constipation, unspecified: Secondary | ICD-10-CM | POA: Diagnosis present

## 2020-03-11 DIAGNOSIS — M25569 Pain in unspecified knee: Secondary | ICD-10-CM

## 2020-03-11 DIAGNOSIS — E559 Vitamin D deficiency, unspecified: Secondary | ICD-10-CM | POA: Diagnosis present

## 2020-03-11 DIAGNOSIS — I10 Essential (primary) hypertension: Secondary | ICD-10-CM | POA: Diagnosis not present

## 2020-03-11 DIAGNOSIS — N1831 Chronic kidney disease, stage 3a: Secondary | ICD-10-CM | POA: Diagnosis present

## 2020-03-11 DIAGNOSIS — I2510 Atherosclerotic heart disease of native coronary artery without angina pectoris: Secondary | ICD-10-CM | POA: Diagnosis present

## 2020-03-11 DIAGNOSIS — R739 Hyperglycemia, unspecified: Secondary | ICD-10-CM | POA: Diagnosis not present

## 2020-03-11 DIAGNOSIS — Z6841 Body Mass Index (BMI) 40.0 and over, adult: Secondary | ICD-10-CM | POA: Diagnosis not present

## 2020-03-11 DIAGNOSIS — R1310 Dysphagia, unspecified: Secondary | ICD-10-CM | POA: Diagnosis present

## 2020-03-11 DIAGNOSIS — G479 Sleep disorder, unspecified: Secondary | ICD-10-CM | POA: Diagnosis not present

## 2020-03-11 DIAGNOSIS — D631 Anemia in chronic kidney disease: Secondary | ICD-10-CM | POA: Diagnosis present

## 2020-03-11 DIAGNOSIS — Z823 Family history of stroke: Secondary | ICD-10-CM

## 2020-03-11 DIAGNOSIS — D649 Anemia, unspecified: Secondary | ICD-10-CM | POA: Diagnosis not present

## 2020-03-11 DIAGNOSIS — G8194 Hemiplegia, unspecified affecting left nondominant side: Secondary | ICD-10-CM | POA: Diagnosis not present

## 2020-03-11 DIAGNOSIS — I69320 Aphasia following cerebral infarction: Secondary | ICD-10-CM

## 2020-03-11 DIAGNOSIS — N179 Acute kidney failure, unspecified: Secondary | ICD-10-CM | POA: Diagnosis present

## 2020-03-11 DIAGNOSIS — I6522 Occlusion and stenosis of left carotid artery: Secondary | ICD-10-CM | POA: Diagnosis present

## 2020-03-11 DIAGNOSIS — I428 Other cardiomyopathies: Secondary | ICD-10-CM | POA: Diagnosis present

## 2020-03-11 DIAGNOSIS — Z79899 Other long term (current) drug therapy: Secondary | ICD-10-CM

## 2020-03-11 DIAGNOSIS — I252 Old myocardial infarction: Secondary | ICD-10-CM

## 2020-03-11 DIAGNOSIS — Z955 Presence of coronary angioplasty implant and graft: Secondary | ICD-10-CM

## 2020-03-11 DIAGNOSIS — I6389 Other cerebral infarction: Secondary | ICD-10-CM | POA: Diagnosis not present

## 2020-03-11 DIAGNOSIS — M1711 Unilateral primary osteoarthritis, right knee: Secondary | ICD-10-CM | POA: Diagnosis not present

## 2020-03-11 DIAGNOSIS — Z886 Allergy status to analgesic agent status: Secondary | ICD-10-CM

## 2020-03-11 DIAGNOSIS — I63512 Cerebral infarction due to unspecified occlusion or stenosis of left middle cerebral artery: Secondary | ICD-10-CM

## 2020-03-11 DIAGNOSIS — I5022 Chronic systolic (congestive) heart failure: Secondary | ICD-10-CM | POA: Diagnosis not present

## 2020-03-11 DIAGNOSIS — I63232 Cerebral infarction due to unspecified occlusion or stenosis of left carotid arteries: Secondary | ICD-10-CM | POA: Diagnosis not present

## 2020-03-11 LAB — COMPREHENSIVE METABOLIC PANEL
ALT: 12 U/L (ref 0–44)
AST: 12 U/L — ABNORMAL LOW (ref 15–41)
Albumin: 3.6 g/dL (ref 3.5–5.0)
Alkaline Phosphatase: 113 U/L (ref 38–126)
Anion gap: 11 (ref 5–15)
BUN: 36 mg/dL — ABNORMAL HIGH (ref 6–20)
CO2: 23 mmol/L (ref 22–32)
Calcium: 9.1 mg/dL (ref 8.9–10.3)
Chloride: 104 mmol/L (ref 98–111)
Creatinine, Ser: 2.41 mg/dL — ABNORMAL HIGH (ref 0.61–1.24)
GFR calc Af Amer: 35 mL/min — ABNORMAL LOW (ref >60)
GFR calc non Af Amer: 30 mL/min — ABNORMAL LOW (ref >60)
Glucose, Bld: 113 mg/dL — ABNORMAL HIGH (ref 70–99)
Potassium: 3.9 mmol/L (ref 3.5–5.1)
Sodium: 138 mmol/L (ref 135–145)
Total Bilirubin: 0.4 mg/dL (ref 0.3–1.2)
Total Protein: 8.7 g/dL — ABNORMAL HIGH (ref 6.5–8.1)

## 2020-03-11 LAB — CBC
HCT: 39.5 % (ref 39.0–52.0)
Hemoglobin: 12.5 g/dL — ABNORMAL LOW (ref 13.0–17.0)
MCH: 30.6 pg (ref 26.0–34.0)
MCHC: 31.6 g/dL (ref 30.0–36.0)
MCV: 96.6 fL (ref 80.0–100.0)
Platelets: 295 10*3/uL (ref 150–400)
RBC: 4.09 MIL/uL — ABNORMAL LOW (ref 4.22–5.81)
RDW: 12.5 % (ref 11.5–15.5)
WBC: 9.6 10*3/uL (ref 4.0–10.5)
nRBC: 0 % (ref 0.0–0.2)

## 2020-03-11 LAB — DIFFERENTIAL
Abs Immature Granulocytes: 0.07 10*3/uL (ref 0.00–0.07)
Basophils Absolute: 0 10*3/uL (ref 0.0–0.1)
Basophils Relative: 0 %
Eosinophils Absolute: 0.1 10*3/uL (ref 0.0–0.5)
Eosinophils Relative: 1 %
Immature Granulocytes: 1 %
Lymphocytes Relative: 13 %
Lymphs Abs: 1.2 10*3/uL (ref 0.7–4.0)
Monocytes Absolute: 0.8 10*3/uL (ref 0.1–1.0)
Monocytes Relative: 8 %
Neutro Abs: 7.3 10*3/uL (ref 1.7–7.7)
Neutrophils Relative %: 77 %

## 2020-03-11 LAB — PROTIME-INR
INR: 1.1 (ref 0.8–1.2)
Prothrombin Time: 13.8 seconds (ref 11.4–15.2)

## 2020-03-11 LAB — CBG MONITORING, ED: Glucose-Capillary: 121 mg/dL — ABNORMAL HIGH (ref 70–99)

## 2020-03-11 LAB — SARS CORONAVIRUS 2 (TAT 6-24 HRS): SARS Coronavirus 2: NEGATIVE

## 2020-03-11 LAB — APTT: aPTT: 32 seconds (ref 24–36)

## 2020-03-11 LAB — TSH: TSH: 5.449 u[IU]/mL — ABNORMAL HIGH (ref 0.350–4.500)

## 2020-03-11 MED ORDER — ALTEPLASE 100 MG IV SOLR
INTRAVENOUS | Status: AC
  Filled 2020-03-11: qty 100

## 2020-03-11 MED ORDER — SODIUM CHLORIDE 0.9 % IV SOLN: INTRAVENOUS | Status: DC

## 2020-03-11 MED ORDER — ONDANSETRON HCL 4 MG/2ML IJ SOLN: 4.0000 mg | Freq: Four times a day (QID) | INTRAMUSCULAR | Status: DC | PRN

## 2020-03-11 MED ORDER — HEPARIN SODIUM (PORCINE) 5000 UNIT/ML IJ SOLN
5000.0000 [IU] | Freq: Three times a day (TID) | INTRAMUSCULAR | Status: DC
  Administered 2020-03-11 – 2020-03-16 (×16): 5000 [IU] via SUBCUTANEOUS
  Filled 2020-03-11 (×16): qty 1

## 2020-03-11 MED ORDER — ACETAMINOPHEN 325 MG PO TABS
650.0000 mg | ORAL_TABLET | Freq: Four times a day (QID) | ORAL | Status: DC | PRN
  Administered 2020-03-12 – 2020-03-15 (×4): 650 mg via ORAL
  Filled 2020-03-11 (×4): qty 2

## 2020-03-11 MED ORDER — CLOPIDOGREL BISULFATE 75 MG PO TABS: 75.0000 mg | ORAL_TABLET | Freq: Every day | ORAL | Status: DC

## 2020-03-11 MED ORDER — SODIUM CHLORIDE 0.9% FLUSH
3.0000 mL | Freq: Once | INTRAVENOUS | Status: AC
  Administered 2020-03-11: 3 mL via INTRAVENOUS

## 2020-03-11 MED ORDER — ATORVASTATIN CALCIUM 40 MG PO TABS: 40.0000 mg | ORAL_TABLET | Freq: Every day | ORAL | Status: DC

## 2020-03-11 MED ORDER — CLOPIDOGREL BISULFATE 75 MG PO TABS
75.0000 mg | ORAL_TABLET | Freq: Every day | ORAL | Status: DC
  Administered 2020-03-12 – 2020-03-16 (×5): 75 mg via ORAL
  Filled 2020-03-11 (×5): qty 1

## 2020-03-11 MED ORDER — SODIUM CHLORIDE 0.9 % IV BOLUS
500.0000 mL | Freq: Once | INTRAVENOUS | Status: AC
  Administered 2020-03-11: 500 mL via INTRAVENOUS

## 2020-03-11 MED ORDER — ATORVASTATIN CALCIUM 40 MG PO TABS
40.0000 mg | ORAL_TABLET | Freq: Every day | ORAL | Status: DC
  Administered 2020-03-12 – 2020-03-16 (×5): 40 mg via ORAL
  Filled 2020-03-11 (×5): qty 1

## 2020-03-11 MED ORDER — ACETAMINOPHEN 650 MG RE SUPP: 650.0000 mg | Freq: Four times a day (QID) | RECTAL | Status: DC | PRN

## 2020-03-11 MED ORDER — STROKE: EARLY STAGES OF RECOVERY BOOK
Freq: Once | Status: AC
  Filled 2020-03-11 (×2): qty 1

## 2020-03-11 MED ORDER — ONDANSETRON HCL 4 MG PO TABS: 4.0000 mg | ORAL_TABLET | Freq: Four times a day (QID) | ORAL | Status: DC | PRN

## 2020-03-11 NOTE — H&P (Addendum)
History and Physical    Zachary Mccann:[id number] DOB: Aug 08, 1971 DOA: 03/11/2020  PCP: [name], NP   Patient coming from: Home  Chief Complaint: Worsening R sided weakness  HPI: Zachary Mccann is a 49 y.o. male with medical history significant for left ICA occlusion in July 2020 with associated CVA causing right-sided hemiparesis and slurred speech as well as mild aphasia, CAD with prior NSTEMI in 2019, hypertension, dyslipidemia, and morbid obesity.  He presented to the ED via EMS after he was noticed by his fiance to be slightly more weak and slumped a little bit at 10 AM.  He was apparently ambulating with his walker at around 830 at his usual baseline level of functioning.  He was noted to have some difficulty with raising his right leg up off the bed and he is usually able to do this.  He is also noticing some pain to his right upper and lower extremity.  He denies any headache, visual changes, or further trouble with his speech.  It is difficult to understand and gather further history from the patient as he is dysarthric.  I have confirmed with his fiance at the concern appear to be mostly with difficulty in raising up his right leg.   ED Course: Patient has stable vital signs and labs with BUN 36 and creatinine 2.41 with usual baseline 1.5-1.7.  He has been given a bolus of IV fluid.  CT of the head with no acute findings noted and teleneurology evaluation without concern for large vessel occlusion or need for TPA noted.  Teleneurology is recommending brain MRI for further evaluation to rule out CVA and maintain on IV fluid for now as it is unclear whether or not he has had another CVA on clinical examination.  Review of Systems: Cannot be obtained given patient condition.  Past Medical History:  Diagnosis Date  . CAD (coronary artery disease)    a. s/p NSTEMI in 04/2018 with DES to LCx.   [name] Hypertension   . Myocardial infarction (HCC)   . Pneumonia   . Stroke [facility])      Past Surgical History:  Procedure Laterality Date  . CORONARY STENT INTERVENTION N/A 05/05/2018   Procedure: CORONARY STENT INTERVENTION;  Surgeon: Marykay Lex, MD;  Location: [facility] INVASIVE CV LAB;  Service: Cardiovascular;  Laterality: N/A;  . LEFT HEART CATH AND CORONARY ANGIOGRAPHY N/A 05/05/2018   Procedure: LEFT HEART CATH AND CORONARY ANGIOGRAPHY;  Surgeon: Marykay Lex, MD;  Location: [facility] INVASIVE CV LAB;  Service: Cardiovascular;  Laterality: N/A;  . TEE WITHOUT CARDIOVERSION N/A 09/08/2018   Procedure: TRANSESOPHAGEAL ECHOCARDIOGRAM (TEE);  Surgeon: Laurey Morale, MD;  Location: [facility] ENDOSCOPY;  Service: Cardiovascular;  Laterality: N/A;     reports that he has quit smoking. His smoking use included cigarettes. He has never used smokeless tobacco. He reports previous alcohol use. He reports previous drug use.  Allergies  Allergen Reactions  . Aspirin Swelling    Family History  Problem Relation Age of Onset  . Hypertension Mother   . Stroke Mother   . Dementia Mother   . Hypertension Father   . Stroke Father   . Cancer Father   . Alcoholism Father   . Cancer Sister     Prior to Admission medications   Medication Sig Start Date End Date Taking? Authorizing Provider  amLODipine (NORVASC) 10 MG tablet Take 1 tablet by mouth once daily 02/23/20  Yes Gosrani, Nimish C, MD  atorvastatin (  LIPITOR) 40 MG tablet TAKE 1 TABLET BY MOUTH ONCE DAILY AT  6:00  PM 02/23/20  Yes Gosrani, Nimish C, MD  carvedilol (COREG) 6.25 MG tablet TAKE 1 TABLET BY MOUTH TWICE DAILY WITH MEALS 02/29/20  Yes Gosrani, Nimish C, MD  clopidogrel (PLAVIX) 75 MG tablet Take 1 tablet (75 mg total) by mouth daily. 11/02/19  Yes Antoine Poche, MD  hydrALAZINE (APRESOLINE) 25 MG tablet TAKE 1 TABLET BY MOUTH THREE TIMES DAILY 12/28/19  Yes [name], NP  Melatonin 10 MG TABS Take 10 mg by mouth at bedtime.   Yes [provider]  mirtazapine (REMERON) 7.5 MG tablet Take 1 tablet (7.5 mg  total) by mouth at bedtime. 10/12/19  Yes [name], NP  acetaminophen (TYLENOL) 325 MG tablet Take 2 tablets (650 mg total) by mouth every 4 (four) hours as needed for mild pain (or temp > 37.5 C (99.5 F)). Patient not taking: Reported on 11/26/2019 08/11/19   Angiulli, Mcarthur Rossetti, PA-C  diclofenac sodium (VOLTAREN) 1 % GEL Apply 2 g topically 4 (four) times daily. Patient not taking: Reported on 11/26/2019 08/11/19   Charlton Amor, PA-C  Multiple Vitamin (MULTIVITAMIN WITH MINERALS) TABS tablet Take 1 tablet by mouth daily. Patient not taking: Reported on 03/11/2020 08/11/19   Angiulli, Mcarthur Rossetti, PA-C  senna-docusate (SENOKOT-S) 8.6-50 MG tablet Take 2 tablets by mouth at bedtime. Patient not taking: Reported on 11/26/2019 10/12/19   [name], NP  amLODipine (NORVASC) 10 MG tablet Take 1 tablet (10 mg total) by mouth daily. 10/12/19   [name], NP  atorvastatin (LIPITOR) 40 MG tablet Take 1 tablet (40 mg total) by mouth daily at 6 PM. 10/12/19   [name], NP  carvedilol (COREG) 6.25 MG tablet Take 1 tablet (6.25 mg total) by mouth 2 (two) times daily with a meal. 10/12/19   [name], NP    Physical Exam: Vitals:   03/11/20 1238 03/11/20 1250  BP: (!) 173/72 106/69  Pulse:  88  Resp: 18 (!) 22  Temp: 98 F (36.7 C)   TempSrc: Oral   SpO2:  100%  Weight:  (!) 152.5 kg    Constitutional: NAD, calm, comfortable, dysarthric and difficult to understand Vitals:   03/11/20 1238 03/11/20 1250  BP: (!) 173/72 106/69  Pulse:  88  Resp: 18 (!) 22  Temp: 98 F (36.7 C)   TempSrc: Oral   SpO2:  100%  Weight:  (!) 152.5 kg   Eyes: lids and conjunctivae normal ENMT: Mucous membranes are moist.  Neck: normal, supple Respiratory: clear to auscultation bilaterally. Normal respiratory effort. No accessory muscle use.  Cardiovascular: Regular rate and rhythm, no murmurs. No extremity edema. Abdomen: no tenderness, no distention. Bowel sounds positive.    Musculoskeletal:  No joint deformity upper and lower extremities.   Skin: no rashes, lesions, ulcers.  Psychiatric: Normal judgment and insight. Alert and oriented x 3. Normal mood.  Neurological evaluation: Difficult to assess and understand patient.  Right side appears weaker on upper and lower extremity testing compared to left.  Much of this is also his usual baseline.  Labs on Admission: I have personally reviewed following labs and imaging studies  CBC: Recent Labs  Lab 03/11/20 1335  WBC 9.6  NEUTROABS 7.3  HGB 12.5*  HCT 39.5  MCV 96.6  PLT 295   Basic Metabolic Panel: Recent Labs  Lab 03/11/20 1335  NA 138  K 3.9  CL 104  CO2 23  GLUCOSE 113*  BUN 36*  CREATININE 2.41*  CALCIUM 9.1   GFR: Estimated Creatinine Clearance: 56.4 mL/min (A) (by C-G formula based on SCr of 2.41 mg/dL (H)). Liver Function Tests: Recent Labs  Lab 03/11/20 1335  AST 12*  ALT 12  ALKPHOS 113  BILITOT 0.4  PROT 8.7*  ALBUMIN 3.6   No results for input(s): LIPASE, AMYLASE in the last 168 hours. No results for input(s): AMMONIA in the last 168 hours. Coagulation Profile: Recent Labs  Lab 03/11/20 1335  INR 1.1   Cardiac Enzymes: No results for input(s): CKTOTAL, CKMB, CKMBINDEX, TROPONINI in the last 168 hours. BNP (last 3 results) No results for input(s): PROBNP in the last 8760 hours. HbA1C: No results for input(s): HGBA1C in the last 72 hours. CBG: Recent Labs  Lab 03/11/20 1227  GLUCAP 121*   Lipid Profile: No results for input(s): CHOL, HDL, LDLCALC, TRIG, CHOLHDL, LDLDIRECT in the last 72 hours. Thyroid Function Tests: No results for input(s): TSH, T4TOTAL, FREET4, T3FREE, THYROIDAB in the last 72 hours. Anemia Panel: No results for input(s): VITAMINB12, FOLATE, FERRITIN, TIBC, IRON, RETICCTPCT in the last 72 hours. Urine analysis:    Component Value Date/Time   COLORURINE AMBER (A) 07/28/2019 1455   APPEARANCEUR CLOUDY (A) 07/28/2019 1455   LABSPEC 1.020  07/28/2019 1455   PHURINE 5.0 07/28/2019 1455   GLUCOSEU NEGATIVE 07/28/2019 1455   HGBUR MODERATE (A) 07/28/2019 1455   BILIRUBINUR NEGATIVE 07/28/2019 1455   KETONESUR NEGATIVE 07/28/2019 1455   PROTEINUR 100 (A) 07/28/2019 1455   NITRITE NEGATIVE 07/28/2019 1455   LEUKOCYTESUR LARGE (A) 07/28/2019 1455    Radiological Exams on Admission: CT HEAD CODE STROKE WO CONTRAST  Result Date: 03/11/2020 CLINICAL DATA:  Code stroke. Ataxia, stroke suspected. Possible stroke, increased weakness at start today at 10 a.m., right-sided weakness from previous stroke. Patient reports weakness on left side. EXAM: CT HEAD WITHOUT CONTRAST TECHNIQUE: Contiguous axial images were obtained from the base of the skull through the vertex without intravenous contrast. COMPARISON:  Head CT 07/27/2019, brain MRI 07/11/2019. FINDINGS: Brain: The examination is mildly motion degraded. There is no evidence of acute intracranial hemorrhage. No acute demarcated cortical infarct is identified. No evidence of intracranial mass. No midline shift or extra-axial fluid collection. Redemonstrated extensive chronic ischemic changes within the left cerebral white matter. Redemonstrated chronic lacunar infarct within the right corona radiata/basal ganglia. Also unchanged, there are chronic lacunar infarcts within the left cerebellar hemisphere. Additional ill-defined hypoattenuation within the cerebral white matter is nonspecific, but consistent with chronic small vessel ischemic disease mild generalized parenchymal atrophy. Vascular: No hyperdense vessel.  Atherosclerotic calcifications. Skull: No calvarial fracture or suspicious osseous lesion. Sinuses/Orbits: Mucosal thickening within the dependent left maxillary sinus. No significant mastoid effusion. These results were called by telephone at the time of interpretation on 03/11/2020 at 1:00 pm to provider [facility] , who verbally acknowledged these results. IMPRESSION: No CT evidence  of acute intracranial abnormality. Redemonstrated extensive chronic ischemic changes within the left cerebral white matter. Consider brain MRI to exclude acute on chronic ischemia in this region. Redemonstrated chronic lacunar infarcts within the right corona radiata/basal ganglia and left cerebellum. Background generalized parenchymal atrophy and chronic small vessel ischemic disease. Electronically Signed   By: Kellie Simmering DO   On: 03/11/2020 13:00    EKG: Independently reviewed. SR 82bpm.  Assessment/Plan Active Problems:   CVA (cerebral vascular accident) ([place])    Worsening, acute on chronic right-sided hemiparesis -In setting of  prior CVA with left MCA-distribution CVA causing residual right-sided weakness and slurred speech as well as mild aphasia.  It is therefore very difficult to examine him clinically and ascertain whether potentially new/acute changes. -Teleneurology consultation with evaluation not suggesting LVO and no TPA recommended -He will need transfer to [facility] for brain MRI to assess for CVA and possible neurology consultation thereafter -Hold on 2D echocardiogram and further studies until brain MRI result noted -Maintain on IV fluid hydration for now as well as Plavix -Patient states that he has been compliant with his home Plavix use -Check lipid panel and hemoglobin A1c -PT/OT/SLP evaluation will be ordered -Telemetry monitoring  AKI on CKD stage IIIa -Patient appears to be somewhat dehydrated and he will be started on IV fluid -Continue monitor I's and O's and avoid nephrotoxic agents -Fluid resuscitation  Dyslipidemia -Lipid panel -Maintain on atorvastatin  Hypertension -Avoid antihypertensives and allow for permissive hypertension  CAD -Maintain on Plavix and statin -Hold Coreg to allow permissive hypertension  Morbid obesity -BMI 45.60 -We will need to work on lifestyle changes outpatient   DVT prophylaxis: Heparin Code Status: Full Family  Communication: Discussed with fianc on phone Disposition Plan: Transfer to [facility] for brain MRI and possible neurological consultation based on MRI results. Consults called: None Admission status: Inpatient, Tele   Mieke Brinley D Merin Borjon DO [facility]  If 7PM-7AM, please contact night-coverage [email or web address]  03/11/2020, 2:53 PM

## 2020-03-11 NOTE — ED Notes (Signed)
Patient has history of dysarthria. Patient's wife states it is at baseline.

## 2020-03-11 NOTE — Progress Notes (Signed)
1232 call time 1238 beeper time 1243 exam started 1244 exam finished 1244 images sent to [facility] 1248 EXAM completed in Epic 1249 [facility] radiology called

## 2020-03-11 NOTE — ED Notes (Signed)
Called [facility] with bed assignment and for transport to [facility]. 

## 2020-03-11 NOTE — ED Provider Notes (Signed)
[facility] EMERGENCY DEPARTMENT Provider Note   CSN: [id number] Arrival date & time: 03/11/20  1215  An emergency department physician performed an initial assessment on this suspected stroke patient at 1242.  History Chief Complaint  Patient presents with  . Weakness  . Code Stroke    Zachary Mccann is a 49 y.o. male.  Patient with history of coronary artery disease, high blood pressure, stroke with right-sided deficits and speech deficits at baseline presents with feeling weaker on his right side and also on his left side.  I try to clarify this with him and he said he feels overall weak on his right side than baseline and was unable to walk starting at 10:00 and at 6:00 when he woke up he was at his baseline.        Past Medical History:  Diagnosis Date  . CAD (coronary artery disease)    a. s/p NSTEMI in 04/2018 with DES to LCx.   [name] Hypertension   . Myocardial infarction (HCC)   . Pneumonia   . Stroke [facility])     Patient Active Problem List   Diagnosis Date Noted  . Leukocytosis   . Hemiparesis affecting dominant side as late effect of stroke (HCC)   . Labile blood pressure   . Sleep disturbance   . Benign hypertensive heart and kidney disease with diastolic CHF, NYHA class II and CKD stage III (HCC)   . Chronic systolic congestive heart failure (HCC)   . Morbid obesity (HCC)   . Uncontrolled hypertension   . Dysphagia, post-stroke   . Cardiomegaly   . Acute systolic congestive heart failure (HCC)   . 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 Cerebral embolism with cerebral infarction 07/11/2019  . Acute CVA (cerebrovascular accident) (HCC) 07/11/2019  . Slurred speech 07/10/2019  . Cardiomyopathy (HCC) 11/20/2018  . History of stroke 09/06/2018  . Tobacco use 09/06/2018  . History of non-ST elevation myocardial infarction (NSTEMI) 05/05/2018  . Severe Vitamin D deficiency 04/02/2018  . Community acquired pneumonia of left lower lobe of lung  04/02/2018  . CKD (chronic kidney disease), stage III (HCC) = Secondary to Hypertension 04/02/2018  . Pneumonia 03/30/2018  . Metabolic acidosis 03/30/2018  . AKI (acute kidney injury) (HCC) 03/30/2018  . N&V (nausea and vomiting) 03/30/2018  . Essential hypertension 03/30/2018    Past Surgical History:  Procedure Laterality Date  . CORONARY STENT INTERVENTION N/A 05/05/2018   Procedure: CORONARY STENT INTERVENTION;  Surgeon: Marykay Lex, MD;  Location: [facility] INVASIVE CV LAB;  Service: Cardiovascular;  Laterality: N/A;  . LEFT HEART CATH AND CORONARY ANGIOGRAPHY N/A 05/05/2018   Procedure: LEFT HEART CATH AND CORONARY ANGIOGRAPHY;  Surgeon: Marykay Lex, MD;  Location: [facility] INVASIVE CV LAB;  Service: Cardiovascular;  Laterality: N/A;  . TEE WITHOUT CARDIOVERSION N/A 09/08/2018   Procedure: TRANSESOPHAGEAL ECHOCARDIOGRAM (TEE);  Surgeon: Laurey Morale, MD;  Location: [facility] ENDOSCOPY;  Service: Cardiovascular;  Laterality: N/A;       Family History  Problem Relation Age of Onset  . Hypertension Mother   . Stroke Mother   . Dementia Mother   . Hypertension Father   . Stroke Father   . Cancer Father   . Alcoholism Father   . Cancer Sister     Social History   Tobacco Use  . Smoking status: Former Smoker    Types: Cigarettes  . Smokeless tobacco: Never Used  . Tobacco comment: smoked for 10-20 years at 1/2 ppd  as of 2019  Substance Use Topics  . Alcohol use: Not Currently  . Drug use: Not Currently    Home Medications Prior to Admission medications   Medication Sig Start Date End Date Taking? Authorizing Provider  amLODipine (NORVASC) 10 MG tablet Take 1 tablet by mouth once daily 02/23/20  Yes Gosrani, Nimish C, MD  atorvastatin (LIPITOR) 40 MG tablet TAKE 1 TABLET BY MOUTH ONCE DAILY AT  6:00  PM 02/23/20  Yes Gosrani, Nimish C, MD  carvedilol (COREG) 6.25 MG tablet TAKE 1 TABLET BY MOUTH TWICE DAILY WITH MEALS 02/29/20  Yes Gosrani, Nimish C, MD  clopidogrel (PLAVIX) 75  MG tablet Take 1 tablet (75 mg total) by mouth daily. 11/02/19  Yes Antoine Poche, MD  hydrALAZINE (APRESOLINE) 25 MG tablet TAKE 1 TABLET BY MOUTH THREE TIMES DAILY 12/28/19  Yes [name], NP  Melatonin 10 MG TABS Take 10 mg by mouth at bedtime.   Yes [provider]  mirtazapine (REMERON) 7.5 MG tablet Take 1 tablet (7.5 mg total) by mouth at bedtime. 10/12/19  Yes [name], NP  acetaminophen (TYLENOL) 325 MG tablet Take 2 tablets (650 mg total) by mouth every 4 (four) hours as needed for mild pain (or temp > 37.5 C (99.5 F)). Patient not taking: Reported on 11/26/2019 08/11/19   Angiulli, Mcarthur Rossetti, PA-C  diclofenac sodium (VOLTAREN) 1 % GEL Apply 2 g topically 4 (four) times daily. Patient not taking: Reported on 11/26/2019 08/11/19   Charlton Amor, PA-C  Multiple Vitamin (MULTIVITAMIN WITH MINERALS) TABS tablet Take 1 tablet by mouth daily. Patient not taking: Reported on 03/11/2020 08/11/19   Angiulli, Mcarthur Rossetti, PA-C  senna-docusate (SENOKOT-S) 8.6-50 MG tablet Take 2 tablets by mouth at bedtime. Patient not taking: Reported on 11/26/2019 10/12/19   [name], NP  amLODipine (NORVASC) 10 MG tablet Take 1 tablet (10 mg total) by mouth daily. 10/12/19   [name], NP  atorvastatin (LIPITOR) 40 MG tablet Take 1 tablet (40 mg total) by mouth daily at 6 PM. 10/12/19   [name], NP  carvedilol (COREG) 6.25 MG tablet Take 1 tablet (6.25 mg total) by mouth 2 (two) times daily with a meal. 10/12/19   [name], NP    Allergies    Aspirin  Review of Systems   Review of Systems  Unable to perform ROS: Acuity of condition    Physical Exam Updated Vital Signs BP 106/69 (BP Location: Left Arm)   Pulse 88   Temp 98 F (36.7 C) (Oral)   Resp (!) 22   Wt (!) 152.5 kg   SpO2 100%   BMI 45.60 kg/m   Physical Exam Vitals and nursing note reviewed.  Constitutional:      Appearance: He is well-developed.  HENT:     Head: Normocephalic and atraumatic.    Eyes:     General:        Right eye: No discharge.        Left eye: No discharge.     Conjunctiva/sclera: Conjunctivae normal.  Neck:     Trachea: No tracheal deviation.  Cardiovascular:     Rate and Rhythm: Normal rate and regular rhythm.  Pulmonary:     Effort: Pulmonary effort is normal.     Breath sounds: Normal breath sounds.  Abdominal:     General: There is no distension.     Palpations: Abdomen is soft.     Tenderness: There is no  abdominal tenderness. There is no guarding.  Musculoskeletal:     Cervical back: Normal range of motion and neck supple.  Skin:    General: Skin is warm.     Findings: No rash.  Neurological:     Mental Status: He is alert and oriented to person, place, and time.     Comments: Patient has weakness right arm and right leg compared to normal strength left arm and left leg.  No arm drift appreciated on the left arm.  Extraocular muscle function intact, pupils equal.  Neck supple.  Significant dysarthria.  Psychiatric:        Behavior: Behavior is slowed.     ED Results / Procedures / Treatments   Labs (all labs ordered are listed, but only abnormal results are displayed) Labs Reviewed  CBC - Abnormal; Notable for the following components:      Result Value   RBC 4.09 (*)    Hemoglobin 12.5 (*)    All other components within normal limits  COMPREHENSIVE METABOLIC PANEL - Abnormal; Notable for the following components:   Glucose, Bld 113 (*)    BUN 36 (*)    Creatinine, Ser 2.41 (*)    Total Protein 8.7 (*)    AST 12 (*)    GFR calc non Af Amer 30 (*)    GFR calc Af Amer 35 (*)    All other components within normal limits  CBG MONITORING, ED - Abnormal; Notable for the following components:   Glucose-Capillary 121 (*)    All other components within normal limits  PROTIME-INR  APTT  DIFFERENTIAL  URINALYSIS, ROUTINE W REFLEX MICROSCOPIC  CBG MONITORING, ED  I-STAT CHEM 8, ED    EKG None  Radiology CT HEAD CODE STROKE WO  CONTRAST  Result Date: 03/11/2020 CLINICAL DATA:  Code stroke. Ataxia, stroke suspected. Possible stroke, increased weakness at start today at 10 a.m., right-sided weakness from previous stroke. Patient reports weakness on left side. EXAM: CT HEAD WITHOUT CONTRAST TECHNIQUE: Contiguous axial images were obtained from the base of the skull through the vertex without intravenous contrast. COMPARISON:  Head CT 07/27/2019, brain MRI 07/11/2019. FINDINGS: Brain: The examination is mildly motion degraded. There is no evidence of acute intracranial hemorrhage. No acute demarcated cortical infarct is identified. No evidence of intracranial mass. No midline shift or extra-axial fluid collection. Redemonstrated extensive chronic ischemic changes within the left cerebral white matter. Redemonstrated chronic lacunar infarct within the right corona radiata/basal ganglia. Also unchanged, there are chronic lacunar infarcts within the left cerebellar hemisphere. Additional ill-defined hypoattenuation within the cerebral white matter is nonspecific, but consistent with chronic small vessel ischemic disease mild generalized parenchymal atrophy. Vascular: No hyperdense vessel.  Atherosclerotic calcifications. Skull: No calvarial fracture or suspicious osseous lesion. Sinuses/Orbits: Mucosal thickening within the dependent left maxillary sinus. No significant mastoid effusion. These results were called by telephone at the time of interpretation on 03/11/2020 at 1:00 pm to provider [facility] , who verbally acknowledged these results. IMPRESSION: No CT evidence of acute intracranial abnormality. Redemonstrated extensive chronic ischemic changes within the left cerebral white matter. Consider brain MRI to exclude acute on chronic ischemia in this region. Redemonstrated chronic lacunar infarcts within the right corona radiata/basal ganglia and left cerebellum. Background generalized parenchymal atrophy and chronic small vessel  ischemic disease. Electronically Signed   By: Jackey Loge DO   On: 03/11/2020 13:00    Procedures .Critical Care Performed by: Blane Ohara, MD Authorized by: Blane Ohara, MD  Critical care provider statement:    Critical care time (minutes):  35   Critical care start time:  03/11/2020 12:45 PM   Critical care time was exclusive of:  Separately billable procedures and treating other patients and teaching time   Critical care was time spent personally by me on the following activities:  Discussions with consultants, evaluation of patient's response to treatment, examination of patient, ordering and performing treatments and interventions, ordering and review of laboratory studies, ordering and review of radiographic studies, pulse oximetry, re-evaluation of patient's condition, obtaining history from patient or surrogate and review of old charts   (including critical care time)  Medications Ordered in ED Medications  sodium chloride flush (NS) 0.9 % injection 3 mL (has no administration in time range)  sodium chloride 0.9 % bolus 500 mL (has no administration in time range)    ED Course  I have reviewed the triage vital signs and the nursing notes.  Pertinent labs & imaging results that were available during my care of the patient were reviewed by me and considered in my medical decision making (see chart for details).    MDM Rules/Calculators/A&P                     Patient presents with worsening stroke symptoms that started at 10:00 today.  Code stroke called immediately, patient brought to CT scan upon arrival.  Neurology teleconsulted for discussion assistance. Discussed with neurologist on the phone and patient is not a TPA candidate as last seen normal was 830.  Patient is weaker than normal and will need further work-up for metabolic etc.  Neurology recommends admission for further work-up and observation.  Blood work and urinalysis pending.  Discussed with radiology for  CT scan results no bleeding however old strokes visualized.  Blood work reviewed normal glucose, hemoglobin 12.5, normal white blood cell count, creatinine elevated compared to previous now 2.4.  Paged hospitalist for admission for further monitoring for his worsening stroke symptoms, metabolic work-up and to monitor kidney function closely.   Final Clinical Impression(s) / ED Diagnoses Final diagnoses:  Right sided weakness  History of CVA in adulthood  Acute renal failure, unspecified acute renal failure type [facility])    Rx / DC Orders ED Discharge Orders    None       Elnora Morrison, MD 03/11/20 1419

## 2020-03-11 NOTE — Consult Note (Signed)
[facility] Tele[facility]urology Consult Services   Date of Service:   03/11/2020 12:43:56  Impression:     .  I63.9 - Cerebrovascular accident (CVA), unspecified mechanism (HCC)  Comments/Sign-Out: the patient has worsening right-sided weakness and was apparently a little bit slumped when his wife saw him at 10 AM. His blood pressures have been low most recently in the emergency department and 90 at low 100s systolic range. He is not a TPA candidate since his last known normal is over 4.5 hours ago and he has a chronically occluded left ICA. Also his modified Rankin is high. Recommend he be admitted for MRI of the brain toxic metabolic workup fluid resuscitation. Question whether or not his symptoms are more due to recrudescence with weakness being worse in the setting of borderline low blood pressure. when taking into account his baseline level of deficit in his NIH is approximately a 1-2  Metrics: Last Known Well: 03/11/2020 08:30:00 [facility] Notification Time: 03/11/2020 12:43:56 Arrival Time: 03/11/2020 12:15:00 Stamp Time: 03/11/2020 12:43:56 Telephone Response Time: 03/11/2020 12:48:00 Time First Login Attempt: 03/11/2020 12:48:00 Symptoms: right-sided weakness worse than usual NIHSS Start Assessment Time: 03/11/2020 13:01:14 Patient is not a candidate for Alteplase/Activase. Alteplase Medical Decision: 03/11/2020 13:13:00 Patient was not deemed candidate for Alteplase/Activase thrombolytics because of Last Well Known Above 4.5 Hours.  CT head showed no acute hemorrhage or acute core infarct.  Clinical Presentation is not Suggestive of Large Vessel Occlusive Disease  Radiologist was not called back for review of advanced imaging because na ED Physician notified of diagnostic impression and management plan on 03/11/2020 13:19:15  Our recommendations are outlined below.  Recommendations:     .  Activate Stroke Protocol Admission/Order Set     .  Stroke/Telemetry Floor  .  Neuro Checks     .  Bedside Swallow Eval     .  DVT Prophylaxis     .  IV Fluids, Normal Saline     .  Head of Bed 30 Degrees     .  Euglycemia and Avoid Hyperthermia (PRN Acetaminophen)     .  he may continue home Plavix  Routine Consultation with Inhouse Neurology for Follow up Care  Sign Out:     .  Discussed with Emergency Department Provider    ------------------------------------------------------------------------------  History of Present Illness: Patient is a 49 year old Male.  Patient was brought by EMS for symptoms of right-sided weakness worse than usual  the patient is a very pleasant 49 year old gentleman with a history of a left ICA occlusion in July 2020 causing right-sided weakness and slurred speech and some mild aphasia. He had a complete occlusion and he was not a TPA order interventional candidate at that time. Today he presents with worsening right-sided weakness compared to his baseline. His wife was contacted by phone who said he was up walking around at about 8:30 with his walker and seemed to be at his usual level of weakness. Then at about 10 she felt kind of slumped a little bit with inability to completely lift the right leg up off the bed which is not his baseline. He is morbidly obese and has a history of prior drug use, prior tobacco use, hypertension, prior non-STEMI. he takes Plavix daily   Past Medical History:     . Hypertension     . Hyperlipidemia     . Coronary Artery Disease     . Stroke     . There is NO history of Diabetes Mellitus     .  There is NO history of Atrial Fibrillation  Anticoagulant use:  No  Antiplatelet use: Plavix    Examination: BP(92/57), Pulse(85), Blood Glucose(p) 1A: Level of Consciousness - Alert; keenly responsive + 0 1B: Ask Month and Age - Both Questions Right + 0 1C: Blink Eyes & Squeeze Hands - Performs Both Tasks + 0 2: Test Horizontal Extraocular Movements - Normal + 0 3: Test Visual Fields - No  Visual Loss + 0 4: Test Facial Palsy (Use Grimace if Obtunded) - Normal symmetry + 0 5A: Test Left Arm Motor Drift - No Drift for 10 Seconds + 0 5B: Test Right Arm Motor Drift - Drift, but doesn't hit bed + 1 6A: Test Left Leg Motor Drift - No Drift for 5 Seconds + 0 6B: Test Right Leg Motor Drift - No Effort Against Gravity + 3 7: Test Limb Ataxia (FNF/Heel-Shin) - No Ataxia + 0 8: Test Sensation - Mild-Moderate Loss: Less Sharp/More Dull + 1 9: Test Language/Aphasia - Mild-Moderate Aphasia: Some Obvious Changes, Without Significant Limitation + 1 10: Test Dysarthria - Mild-Moderate Dysarthria: Slurring but can be understood + 1 11: Test Extinction/Inattention - No abnormality + 0  NIHSS Score: 7  Pre-Morbid Modified Ranking Scale: 3 Points = Moderate disability; requiring some help, but able to walk without assistance   Patient/Family was informed the Neurology Consult would occur via TeleHealth consult by way of interactive audio and video telecommunications and consented to receiving care in this manner.   Due to the immediate potential for life-threatening deterioration due to underlying acute neurologic illness, I spent 35 minutes providing critical care. This time includes time for face to face visit via telemedicine, review of medical records, imaging studies and discussion of findings with providers, the patient and/or family.   Dr Katina Degree   [facility] [phone]  Case [id number]

## 2020-03-11 NOTE — ED Triage Notes (Signed)
Per EMS patient complains of weakness that started today at 10 am. History of right sided weakness from previous stroke. Patient states weakness on left side.

## 2020-03-12 DIAGNOSIS — R531 Weakness: Secondary | ICD-10-CM

## 2020-03-12 LAB — BASIC METABOLIC PANEL
Anion gap: 11 (ref 5–15)
BUN: 22 mg/dL — ABNORMAL HIGH (ref 6–20)
CO2: 22 mmol/L (ref 22–32)
Calcium: 9 mg/dL (ref 8.9–10.3)
Chloride: 107 mmol/L (ref 98–111)
Creatinine, Ser: 1.6 mg/dL — ABNORMAL HIGH (ref 0.61–1.24)
GFR calc Af Amer: 58 mL/min — ABNORMAL LOW (ref >60)
GFR calc non Af Amer: 50 mL/min — ABNORMAL LOW (ref >60)
Glucose, Bld: 116 mg/dL — ABNORMAL HIGH (ref 70–99)
Potassium: 4.3 mmol/L (ref 3.5–5.1)
Sodium: 140 mmol/L (ref 135–145)

## 2020-03-12 MED ORDER — HYDRALAZINE HCL 25 MG PO TABS
25.0000 mg | ORAL_TABLET | Freq: Three times a day (TID) | ORAL | Status: DC
  Administered 2020-03-12 – 2020-03-16 (×12): 25 mg via ORAL
  Filled 2020-03-12 (×13): qty 1

## 2020-03-12 MED ORDER — MIRTAZAPINE 15 MG PO TABS
7.5000 mg | ORAL_TABLET | Freq: Every day | ORAL | Status: DC
  Administered 2020-03-12 – 2020-03-15 (×4): 7.5 mg via ORAL
  Filled 2020-03-12 (×4): qty 1

## 2020-03-12 MED ORDER — CARVEDILOL 6.25 MG PO TABS
6.2500 mg | ORAL_TABLET | Freq: Two times a day (BID) | ORAL | Status: DC
  Administered 2020-03-12 – 2020-03-16 (×8): 6.25 mg via ORAL
  Filled 2020-03-12 (×8): qty 1

## 2020-03-12 MED ORDER — ADULT MULTIVITAMIN W/MINERALS CH: 1.0000 | ORAL_TABLET | Freq: Every day | ORAL | Status: DC

## 2020-03-12 MED ORDER — SENNOSIDES-DOCUSATE SODIUM 8.6-50 MG PO TABS
2.0000 | ORAL_TABLET | Freq: Every day | ORAL | Status: DC
  Administered 2020-03-12 – 2020-03-15 (×3): 2 via ORAL
  Filled 2020-03-12 (×4): qty 2

## 2020-03-12 MED ORDER — AMLODIPINE BESYLATE 10 MG PO TABS
10.0000 mg | ORAL_TABLET | Freq: Every day | ORAL | Status: DC
  Administered 2020-03-12 – 2020-03-16 (×5): 10 mg via ORAL
  Filled 2020-03-12 (×4): qty 1
  Filled 2020-03-12: qty 2

## 2020-03-12 MED ORDER — MELATONIN 3 MG PO TABS
9.0000 mg | ORAL_TABLET | Freq: Every day | ORAL | Status: DC
  Administered 2020-03-12 – 2020-03-15 (×4): 9 mg via ORAL
  Filled 2020-03-12 (×5): qty 3

## 2020-03-12 NOTE — Progress Notes (Signed)
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  Initial CT of the head did not show any acute findings.  Telemetry neurology evaluation was done at [facility], ED there was no large concern for large vessel occlusion or TPA infusion.  Recommended getting a brain MRI for further evaluation to rule out any new CVA.  Patient was transferred to [facility] for an MRI of the brain.  MRI of the brain does not show any acute infarct, but was Positive for progressive deep watershed ischemia and development of left side Wallerian degeneration since July of last year. Underlying chronic multifocal intracranial vessel occlusion discussed the above findings with the neurologist on-call recommended no further work-up at this time and to follow-up therapy evaluations for disposition.  Assessment & Plan:   Active Problems:   CVA (cerebral vascular accident) ([place])    Generalized weakness and worsening left hemiparesis MRI of the brain ruled out acute CVA.  Discussed with neurology recommended therapy evaluations and no further stroke work-up. Plan for PT, OT evaluations for disposition.  Continue with Plavix, atorvastatin 40 mg. Outpatient follow-up with neurology recommended.   History of CVA CAD s/p NSTEMI Patient denies any chest pain or shortness of breath at this time.  Continue with Plavix, restarted Coreg and statin.    Essential hypertension Blood pressure parameters slightly high, resume home medications.   Hyperlipidemia Continue with atorvastatin 40 mg daily.   Body mass index is 45.6 kg/m. Morbid  obesity   History of left ICA infarct with residual right-sided hemiparesis and slurred speech and mild aphasia Continue with Plavix, atorvastatin and Coreg.   DVT prophylaxis: Heparin Code Status: Full code Family Communication: None at bedside Disposition Plan:   Patient came from: Home             Anticipated d/c place: Pending therapy evaluation  Barriers to d/c OR conditions which need to be met to effect a safe d/c: Pending therapy evaluation   Consultants:   Curbside consultation with neurologist  Procedures: MRI of the brain without contrast Antimicrobials: None  Subjective: Patient denies any chest pain or shortness of breath at this time.  Objective: Vitals:   03/12/20 0041 03/12/20 0337 03/12/20 0805 03/12/20 1131  BP: 124/76 108/74 (!) 133/101 (!) 141/92  Pulse: 74 83 97 91  Resp: 20 20 18 20   Temp: (!) 97.5 F (36.4 C) 98 F (36.7 C) 97.8 F (36.6 C) (!) 97.3 F (36.3 C)  TempSrc: Oral Oral Oral Oral  SpO2: 100% 93% 98% 96%  Weight:        Intake/Output Summary (Last 24 hours) at 03/12/2020 1431 Last data filed at 03/12/2020 0300 Gross per 24 hour  Intake 1363 ml  Output 300 ml  Net 1063 ml   Filed Weights   03/11/20 1250  Weight: (!) 152.5 kg    Examination:  General exam: Appears calm and comfortable  Respiratory system: Clear to auscultation. Respiratory effort normal. Cardiovascular system: S1 & S2 heard, RRR. No pedal edema. Gastrointestinal system: Abdomen is nondistended, soft and nontender.Normal bowel sounds heard. Central nervous system: Alert, answering questions, speech is slow, right-sided hemiparesis present following commands  Extremities: Symmetric 5 x 5 power. Skin: No rashes, lesions or ulcers Psychiatry:  Mood  is appropriate   Data Reviewed: I have personally reviewed following labs and imaging studies  CBC: Recent Labs  Lab 03/11/20 1335  WBC 9.6  NEUTROABS 7.3  HGB 12.5*  HCT 39.5  MCV 96.6  PLT 106    Basic Metabolic Panel: Recent Labs  Lab 03/11/20 1335  NA 138  K 3.9  CL 104  CO2 23  GLUCOSE 113*  BUN 36*  CREATININE 2.41*  CALCIUM 9.1   GFR: Estimated Creatinine Clearance: 56.4 mL/min (A) (by C-G formula based on SCr of 2.41 mg/dL (H)). Liver Function Tests: Recent Labs  Lab 03/11/20 1335  AST 12*  ALT 12  ALKPHOS 113  BILITOT 0.4  PROT 8.7*  ALBUMIN 3.6   No results for input(s): LIPASE, AMYLASE in the last 168 hours. No results for input(s): AMMONIA in the last 168 hours. Coagulation Profile: Recent Labs  Lab 03/11/20 1335  INR 1.1   Cardiac Enzymes: No results for input(s): CKTOTAL, CKMB, CKMBINDEX, TROPONINI in the last 168 hours. BNP (last 3 results) No results for input(s): PROBNP in the last 8760 hours. HbA1C: No results for input(s): HGBA1C in the last 72 hours. CBG: Recent Labs  Lab 03/11/20 1227  GLUCAP 121*   Lipid Profile: No results for input(s): CHOL, HDL, LDLCALC, TRIG, CHOLHDL, LDLDIRECT in the last 72 hours. Thyroid Function Tests: Recent Labs    03/11/20 1335  TSH 5.449*   Anemia Panel: No results for input(s): VITAMINB12, FOLATE, FERRITIN, TIBC, IRON, RETICCTPCT in the last 72 hours. Sepsis Labs: No results for input(s): PROCALCITON, LATICACIDVEN in the last 168 hours.  Recent Results (from the past 240 hour(s))  SARS CORONAVIRUS 2 (TAT 6-24 HRS) Nasopharyngeal Nasopharyngeal Swab     Status: None   Collection Time: 03/11/20  3:10 PM   Specimen: Nasopharyngeal Swab  Result Value Ref Range Status   SARS Coronavirus 2 NEGATIVE NEGATIVE Final    Comment: (NOTE) SARS-CoV-2 target nucleic acids are NOT DETECTED. The SARS-CoV-2 RNA is generally detectable in upper and lower respiratory specimens during the acute phase of infection. Negative results do not preclude SARS-CoV-2 infection, do not rule out co-infections with other pathogens, and should not be used as the sole basis for treatment or other patient management  decisions. Negative results must be combined with clinical observations, patient history, and epidemiological information. The expected result is Negative. Fact Sheet for Patients: SugarRoll.be Fact Sheet for Healthcare Providers: https://www.woods-mathews.com/ This test is not yet approved or cleared by the [place] FDA and  has been authorized for detection and/or diagnosis of SARS-CoV-2 by FDA under an Emergency Use Authorization (EUA). This EUA will remain  in effect (meaning this test can be used) for the duration of the COVID-19 declaration under Section 56 4(b)(1) of the Act, 21 U.S.C. section 360bbb-3(b)(1), unless the authorization is terminated or revoked sooner. Performed at [facility], [place] [address], [place], [place] [address]          Radiology Studies: [name]  Result Date: 03/11/2020 CLINICAL DATA:  49 year old male code stroke presentation earlier today. Increased right side weakness. Ataxia. EXAM: MRI HEAD WITHOUT CONTRAST TECHNIQUE: Multiplanar, multiecho pulse sequences of the brain and surrounding structures were obtained without intravenous contrast. COMPARISON:  Head CT earlier today.  Brain MRI 07/11/2019. FINDINGS: Brain: No restricted diffusion or evidence of acute infarction. Progressed and now confluent central white matter T2 and FLAIR  hyperintensity in the left hemisphere, with new abnormal signal tracking through the left deep gray nuclei and into the brainstem compatible with Wallerian degeneration (series 9, image 9). Stable superimposed chronic lacunar type infarcts in the right hemisphere, left cerebellum, left lower pons. No definite cortical encephalomalacia. Numerous chronic microhemorrhages in the bilateral thalami and pons are stable. There is mild new microhemorrhage in the left parietal lobe. No midline shift, mass effect, evidence of mass lesion, ventriculomegaly, extra-axial  collection or acute intracranial hemorrhage. Cervicomedullary junction and pituitary are within normal limits. Vascular: Chronically abnormal flow voids of both distal vertebral arteries and the left ICA siphon. Stable major vascular flow voids since July. Skull and upper cervical spine: Stable visible cervical spine, bone marrow signal. Sinuses/Orbits: Orbits are stable and negative. Chronic left maxillary sinusitis. Other: Trace new mastoid air cell fluid on the left. Chronic thickening of the adenoid soft tissues appears stable since July. Other Visible internal auditory structures appear normal. IMPRESSION: 1. Negative for acute infarct. 2. Positive for progressive deep watershed ischemia and development of left side Wallerian degeneration since July of last year. Underlying chronic multifocal intracranial vessel occlusion. 3. Stable superimposed advanced chronic small vessel disease in the deep gray nuclei, brainstem. Electronically Signed   By: Genevie Ann M.D.   On: 03/11/2020 23:20   CT HEAD CODE STROKE WO CONTRAST  Result Date: 03/11/2020 CLINICAL DATA:  Code stroke. Ataxia, stroke suspected. Possible stroke, increased weakness at start today at 10 a.m., right-sided weakness from previous stroke. Patient reports weakness on left side. EXAM: CT HEAD WITHOUT CONTRAST TECHNIQUE: Contiguous axial images were obtained from the base of the skull through the vertex without intravenous contrast. COMPARISON:  Head CT 07/27/2019, brain MRI 07/11/2019. FINDINGS: Brain: The examination is mildly motion degraded. There is no evidence of acute intracranial hemorrhage. No acute demarcated cortical infarct is identified. No evidence of intracranial mass. No midline shift or extra-axial fluid collection. Redemonstrated extensive chronic ischemic changes within the left cerebral white matter. Redemonstrated chronic lacunar infarct within the right corona radiata/basal ganglia. Also unchanged, there are chronic lacunar  infarcts within the left cerebellar hemisphere. Additional ill-defined hypoattenuation within the cerebral white matter is nonspecific, but consistent with chronic small vessel ischemic disease mild generalized parenchymal atrophy. Vascular: No hyperdense vessel.  Atherosclerotic calcifications. Skull: No calvarial fracture or suspicious osseous lesion. Sinuses/Orbits: Mucosal thickening within the dependent left maxillary sinus. No significant mastoid effusion. These results were called by telephone at the time of interpretation on 03/11/2020 at 1:00 pm to provider [facility] , who verbally acknowledged these results. IMPRESSION: No CT evidence of acute intracranial abnormality. Redemonstrated extensive chronic ischemic changes within the left cerebral white matter. Consider brain MRI to exclude acute on chronic ischemia in this region. Redemonstrated chronic lacunar infarcts within the right corona radiata/basal ganglia and left cerebellum. Background generalized parenchymal atrophy and chronic small vessel ischemic disease. Electronically Signed   By: Kellie Simmering DO   On: 03/11/2020 13:00        Scheduled Meds:  atorvastatin  40 mg Oral Q breakfast   clopidogrel  75 mg Oral Q breakfast   heparin  5,000 Units Subcutaneous Q8H   Continuous Infusions:  sodium chloride 100 mL/hr at 03/11/20 1703     LOS: 1 day       Hosie Poisson, MD [facility]   To contact the attending provider between 7A-7P or the covering provider during after hours 7P-7A, please log into the web site [email or web address] and access using  universal  password for that web site. If you do not have the password, please call the hospital operator.  03/12/2020, 2:31 PM

## 2020-03-12 NOTE — Progress Notes (Signed)
Rehab Admissions Coordinator Note:  Per PT/OT recommendations atient was screened by Stephania Fragmin for appropriateness for an Inpatient Acute Rehab Consult.  At this time, we are recommending Inpatient Rehab consult.  I will place an order per our protocol.   Stephania Fragmin 03/12/2020, 11:22 AM  I can be reached at [phone].

## 2020-03-12 NOTE — Evaluation (Signed)
Physical Therapy Evaluation Patient Details Name: Zachary Mccann MRN: [id number] DOB: 07-15-1971 Today's Date: 03/12/2020   History of Present Illness  49 y.o. male with medical history significant for left ICA occlusion in July 2020 with associated CVA causing right-sided hemiparesis and slurred speech as well as mild aphasia, CAD with prior NSTEMI in 2019, hypertension, dyslipidemia, and morbid obesity.  Admitted with worsening weakness of RLE.  Clinical Impression  Pt admitted with above diagnosis. PTA ambulatory with RW although his girlfriend would assist with all ADLs. Rt non-dominant hemiparesis with progression of RLE weakness most notably. Mod-Max assist +2 for bed mobility and sit<>stand transfer today. Pt currently with functional limitations due to the deficits listed below (see PT Problem List). Pt will benefit from skilled PT to increase their independence and safety with mobility to allow discharge to the venue listed below.       Follow Up Recommendations CIR    Equipment Recommendations  None recommended by PT    Recommendations for Other Services Rehab consult     Precautions / Restrictions Precautions Precautions: Fall Restrictions Weight Bearing Restrictions: No      Mobility  Bed Mobility Overal bed mobility: Needs Assistance Bed Mobility: Supine to Sit;Sit to Supine;Rolling;Sit to Sidelying Rolling: Mod assist;+2 for physical assistance   Supine to sit: Mod assist;+2 for physical assistance;HOB elevated   Sit to sidelying: Mod assist;+2 for physical assistance General bed mobility comments: Mod assist +2 for trunk control initialy and RLE support. Cues for technique. Able to pull lightly with RUE. Minimal activity with RLE to assist. Good scooting technique once upright and able to sit EOB unassisted.  Transfers Overall transfer level: Needs assistance Equipment used: Rolling walker (2 wheeled) Transfers: Sit to/from Stand Sit to Stand: Max assist;+2  physical assistance;From elevated surface         General transfer comment: max assist +2 for sit<>stand from elevated bed, performed x2. Unable to fully stand from lowest bed setting. Tolerated approx 30 seconds of standing while ped pad was changed. Easily fatigued. RLE pain in WB, Rt knee onlyl.  Ambulation/Gait                Stairs            Wheelchair Mobility    Modified Rankin (Stroke Patients Only) Modified Rankin (Stroke Patients Only) Pre-Morbid Rankin Score: Moderate disability Modified Rankin: Severe disability     Balance Overall balance assessment: Needs assistance Sitting-balance support: No upper extremity supported;Feet supported Sitting balance-Leahy Scale: Good     Standing balance support: Bilateral upper extremity supported Standing balance-Leahy Scale: Zero                               Pertinent Vitals/Pain Pain Assessment: Faces Faces Pain Scale: Hurts even more Pain Location: right knee Pain Descriptors / Indicators: Aching Pain Intervention(s): Limited activity within patient's tolerance;Monitored during session;Repositioned    Home Living Family/patient expects to be discharged to:: Inpatient rehab Living Arrangements: Spouse/significant other Available Help at Discharge: Available 24 hours/day;Family Type of Home: House Home Access: Ramped entrance     Home Layout: Two level;Able to live on main level with bedroom/bathroom Home Equipment: Dan Humphreys - 2 wheels;Shower seat;Bedside commode;Wheelchair - manual      Prior Function Level of Independence: Needs assistance   Gait / Transfers Assistance Needed: Able to ambulate with RW PTA  ADL's / Homemaking Assistance Needed: Girlfriend assists with all ADLs per pt  report.        Hand Dominance   Dominant Hand: Right    Extremity/Trunk Assessment   Upper Extremity Assessment Upper Extremity Assessment: Defer to OT evaluation    Lower Extremity  Assessment Lower Extremity Assessment: RLE deficits/detail RLE Deficits / Details: 3/5 ankle df and pf, 2+/5 hip flexion, trace knee flexion/extension RLE Coordination: decreased gross motor       Communication   Communication: Expressive difficulties  Cognition Arousal/Alertness: Awake/alert Behavior During Therapy: WFL for tasks assessed/performed Overall Cognitive Status: Within Functional Limits for tasks assessed                                        General Comments      Exercises General Exercises - Lower Extremity Ankle Circles/Pumps: AAROM;Right;10 reps;Supine Short Arc Quad: AAROM;Right;10 reps;Supine   Assessment/Plan    PT Assessment Patient needs continued PT services  PT Problem List Decreased strength;Decreased range of motion;Decreased activity tolerance;Decreased balance;Decreased mobility;Decreased coordination;Decreased safety awareness;Impaired tone;Obesity;Pain       PT Treatment Interventions DME instruction;Gait training;Functional mobility training;Therapeutic activities;Therapeutic exercise;Balance training;Neuromuscular re-education;Patient/family education    PT Goals (Current goals can be found in the Care Plan section)  Acute Rehab PT Goals Patient Stated Goal: Get stronger before going home PT Goal Formulation: With patient Time For Goal Achievement: 03/26/20 Potential to Achieve Goals: Good    Frequency Min 4X/week   Barriers to discharge Other (comment) Level of assistance will require more than currently available.    Co-evaluation PT/OT/SLP Co-Evaluation/Treatment: Yes Reason for Co-Treatment: Complexity of the patient's impairments (multi-system involvement);Necessary to address cognition/behavior during functional activity;For patient/therapist safety;To address functional/ADL transfers PT goals addressed during session: Mobility/safety with mobility;Balance;Proper use of DME;Strengthening/ROM         AM-PAC PT  "6 Clicks" Mobility  Outcome Measure Help needed turning from your back to your side while in a flat bed without using bedrails?: A Lot Help needed moving from lying on your back to sitting on the side of a flat bed without using bedrails?: A Lot Help needed moving to and from a bed to a chair (including a wheelchair)?: Total Help needed standing up from a chair using your arms (e.g., wheelchair or bedside chair)?: A Lot Help needed to walk in hospital room?: Total Help needed climbing 3-5 steps with a railing? : Total 6 Click Score: 9    End of Session Equipment Utilized During Treatment: Gait belt Activity Tolerance: Patient limited by fatigue;Patient tolerated treatment well Patient left: in bed;with call bell/phone within reach;with bed alarm set Nurse Communication: Mobility status;Need for lift equipment PT Visit Diagnosis: Difficulty in walking, not elsewhere classified (R26.2);Hemiplegia and hemiparesis;Pain Hemiplegia - Right/Left: Right Hemiplegia - dominant/non-dominant: Non-dominant Hemiplegia - caused by: Cerebral infarction Pain - Right/Left: Right Pain - part of body: Knee    Time: 2197-5883 PT Time Calculation (min) (ACUTE ONLY): 27 min   Charges:   PT Evaluation $PT Eval Moderate Complexity: 1 [address] [place], PT  Ellouise Newer 03/12/2020, 9:43 AM

## 2020-03-12 NOTE — Progress Notes (Signed)
Patient has history of OSA; pt does not wear CPAP at home and does not want to wear one here either.

## 2020-03-12 NOTE — Evaluation (Addendum)
Occupational Therapy Evaluation Patient Details Name: Zachary Mccann MRN: [id number] DOB: 04/28/1971 Today's Date: 03/12/2020    History of Present Illness 49 y.o. male with medical history significant for left ICA occlusion in July 2020 with associated CVA causing right-sided hemiparesis and slurred speech as well as mild aphasia, CAD with prior NSTEMI in 2019, hypertension, dyslipidemia, and morbid obesity.  Admitted with worsening weakness of RLE.   Clinical Impression   This 49 y/o male presents with the above. PTA pt reports was living at home with significant other who was assisting with ADL tasks, reports was mobilizing with RW. Pt now presenting with worsening R side weakness, decreased standing balance and overall mobility/functional status. Pt with dysarthric speech requiring increased time and intermittent repetition to answer questions. Overall pt following simple commands and appears to be accurate historian regarding PLOF. Pt requiring two person assist for completion of bed mobility and sit<>stand attempts at RW, unable to progress beyond standing due to R knee pain with wt bearing. He currently requires up to [facility] for UB ADL, max-totalA for LB ADL. Pt will benefit from continued acute OT services and will benefit from follow up CIR level therapies at time of discharge to maximize his overall safety and independence with ADL and mobility as well as to decrease level of caregiver burden. Will follow.     Follow Up Recommendations  CIR;Supervision/Assistance - 24 hour    Equipment Recommendations  Other (comment)(TBD)    Recommendations for Other Services Rehab consult     Precautions / Restrictions Precautions Precautions: Fall Restrictions Weight Bearing Restrictions: No      Mobility Bed Mobility Overal bed mobility: Needs Assistance Bed Mobility: Supine to Sit;Sit to Supine;Rolling;Sit to Sidelying Rolling: Mod assist;+2 for physical assistance   Supine to sit: Mod  assist;+2 for physical assistance;HOB elevated   Sit to sidelying: Mod assist;+2 for physical assistance General bed mobility comments: Mod assist +2 for trunk control initialy and RLE support. Cues for technique. Able to pull lightly with RUE. Minimal activity with RLE to assist. Good scooting technique once upright and able to sit EOB unassisted.  Transfers Overall transfer level: Needs assistance Equipment used: Rolling walker (2 wheeled) Transfers: Sit to/from Stand Sit to Stand: Max assist;+2 physical assistance;From elevated surface         General transfer comment: max assist +2 for sit<>stand from elevated bed, performed x2. Unable to fully stand from lowest bed setting. Tolerated approx 30 seconds of standing while ped pad was changed. Easily fatigued. RLE pain in WB, Rt knee only.    Balance Overall balance assessment: Needs assistance Sitting-balance support: No upper extremity supported;Feet supported Sitting balance-Leahy Scale: Good     Standing balance support: Bilateral upper extremity supported Standing balance-Leahy Scale: Zero                             ADL either performed or assessed with clinical judgement   ADL Overall ADL's : Needs assistance/impaired Eating/Feeding: Set up;Supervision/ safety   Grooming: Minimal assistance;Sitting   Upper Body Bathing: Minimal assistance;Sitting   Lower Body Bathing: Maximal assistance;+2 for physical assistance;Sitting/lateral leans;Sit to/from stand   Upper Body Dressing : Minimal assistance;Moderate assistance;Sitting Upper Body Dressing Details (indicate cue type and reason): donning new gown Lower Body Dressing: Maximal assistance;+2 for physical assistance;Sit to/from stand;Sitting/lateral leans       Toileting- Clothing Manipulation and Hygiene: Total assistance;+2 for physical assistance  Functional mobility during ADLs: Maximal assistance;+2 for physical assistance;Rolling  walker(sit<>stand only) General ADL Comments: pt with worsened R side weakness, speech difficulties, decreased standing balance     Vision   Additional Comments: will need to assess further     Perception     Praxis      Pertinent Vitals/Pain Pain Assessment: Faces Faces Pain Scale: Hurts even more Pain Location: right knee Pain Descriptors / Indicators: Aching Pain Intervention(s): Limited activity within patient's tolerance;Monitored during session;Repositioned     Hand Dominance Left   Extremity/Trunk Assessment Upper Extremity Assessment Upper Extremity Assessment: RUE deficits/detail RUE Deficits / Details: 2/5 shoulder flexion, 3/5 elbow/wrist/hand. pt self reports that RUE is now weaker compared to baseline (pt with baseline R side weakness) RUE Coordination: decreased fine motor;decreased gross motor   Lower Extremity Assessment Lower Extremity Assessment: Defer to PT evaluation RLE Deficits / Details: 3/5 ankle df and pf, 2+/5 hip flexion, trace knee flexion/extension RLE Coordination: decreased gross motor       Communication Communication Communication: Expressive difficulties   Cognition Arousal/Alertness: Awake/alert Behavior During Therapy: WFL for tasks assessed/performed Overall Cognitive Status: No family/caregiver present to determine baseline cognitive functioning                                 General Comments: pt able to provide basic PLOF and answer simple questions, appears to be accurate historian, following commands   General Comments       Exercises Exercises: General Lower Extremity General Exercises - Lower Extremity Ankle Circles/Pumps: AAROM;Right;10 reps;Supine Short Arc Quad: AAROM;Right;10 reps;Supine   Shoulder Instructions      Home Living Family/patient expects to be discharged to:: Inpatient rehab Living Arrangements: Spouse/significant other Available Help at Discharge: Available 24 hours/day;Family Type  of Home: House Home Access: Ramped entrance     Home Layout: Two level;Able to live on main level with bedroom/bathroom     Bathroom Shower/Tub: Producer, television/film/video: Standard     Home Equipment: Environmental consultant - 2 wheels;Shower seat;Bedside commode;Wheelchair - manual          Prior Functioning/Environment Level of Independence: Needs assistance  Gait / Transfers Assistance Needed: Able to ambulate with RW PTA ADL's / Homemaking Assistance Needed: Girlfriend assists with all ADLs per pt report.            OT Problem List: Decreased strength;Decreased range of motion;Decreased activity tolerance;Impaired balance (sitting and/or standing);Decreased cognition;Decreased safety awareness;Decreased coordination;Decreased knowledge of use of DME or AE;Cardiopulmonary status limiting activity;Obesity;Impaired UE functional use;Pain      OT Treatment/Interventions: Self-care/ADL training;Therapeutic exercise;Neuromuscular education;Energy conservation;DME and/or AE instruction;Therapeutic activities;Cognitive remediation/compensation;Patient/family education;Balance training;Splinting    OT Goals(Current goals can be found in the care plan section) Acute Rehab OT Goals Patient Stated Goal: Get stronger before going home OT Goal Formulation: With patient Time For Goal Achievement: 03/26/20 Potential to Achieve Goals: Good  OT Frequency: Min 2X/week(will benefit from 3x)   Barriers to D/C:            Co-evaluation PT/OT/SLP Co-Evaluation/Treatment: Yes Reason for Co-Treatment: Complexity of the patient's impairments (multi-system involvement);For patient/therapist safety;To address functional/ADL transfers PT goals addressed during session: Mobility/safety with mobility;Balance;Proper use of DME;Strengthening/ROM OT goals addressed during session: ADL's and self-care;Strengthening/ROM      AM-PAC OT "6 Clicks" Daily Activity     Outcome Measure Help from another person  eating meals?: A Little Help from another person taking care  of personal grooming?: A Little Help from another person toileting, which includes using toliet, bedpan, or urinal?: Total Help from another person bathing (including washing, rinsing, drying)?: A Lot Help from another person to put on and taking off regular upper body clothing?: A Lot Help from another person to put on and taking off regular lower body clothing?: Total 6 Click Score: 12   End of Session Equipment Utilized During Treatment: Gait belt;Rolling walker Nurse Communication: Mobility status  Activity Tolerance: Patient tolerated treatment well Patient left: in bed;with call bell/phone within reach;with bed alarm set  OT Visit Diagnosis: Other abnormalities of gait and mobility (R26.89);Hemiplegia and hemiparesis;Muscle weakness (generalized) (M62.81);Cognitive communication deficit (R41.841) Symptoms and signs involving cognitive functions: Cerebral infarction Hemiplegia - Right/Left: Right Hemiplegia - dominant/non-dominant: Non-Dominant Hemiplegia - caused by: Cerebral infarction                Time: 6269-4854 OT Time Calculation (min): 20 min Charges:  OT General Charges $OT Visit: 1 Visit OT Evaluation $OT Eval Moderate Complexity: 1 Mod  Zachary Mccann, OT Acute Rehabilitation Services Pager [phone] Office [phone]   Raymondo Band 03/12/2020, 11:10 AM

## 2020-03-13 LAB — BASIC METABOLIC PANEL
Anion gap: 9 (ref 5–15)
BUN: 14 mg/dL (ref 6–20)
CO2: 23 mmol/L (ref 22–32)
Calcium: 9.2 mg/dL (ref 8.9–10.3)
Chloride: 107 mmol/L (ref 98–111)
Creatinine, Ser: 1.47 mg/dL — ABNORMAL HIGH (ref 0.61–1.24)
GFR calc Af Amer: >60 mL/min (ref >60)
GFR calc non Af Amer: 55 mL/min — ABNORMAL LOW (ref >60)
Glucose, Bld: 101 mg/dL — ABNORMAL HIGH (ref 70–99)
Potassium: 4 mmol/L (ref 3.5–5.1)
Sodium: 139 mmol/L (ref 135–145)

## 2020-03-13 LAB — T4, FREE: Free T4: 0.95 ng/dL (ref 0.61–1.12)

## 2020-03-13 LAB — MAGNESIUM: Magnesium: 1.9 mg/dL (ref 1.7–2.4)

## 2020-03-13 MED ORDER — SODIUM CHLORIDE 0.9 % IV BOLUS
1000.0000 mL | Freq: Once | INTRAVENOUS | Status: AC
  Administered 2020-03-13: 1000 mL via INTRAVENOUS

## 2020-03-13 NOTE — Progress Notes (Signed)
Physical Therapy Treatment Patient Details Name: Zachary Mccann MRN: [id number] DOB: [birth year] Today's Date: 03/13/2020    History of Present Illness 49 y.o. male with medical history significant for left ICA occlusion in July 2020 with associated CVA causing right-sided hemiparesis and slurred speech as well as mild aphasia, CAD with prior NSTEMI in 2019, hypertension, dyslipidemia, and morbid obesity.  Admitted with worsening weakness of RLE.    PT Comments    Continuing work on functional mobility and activity tolerance;  Session focused on sit<>stand transfers; Stood 3 times with 2 person max assist; required R knee block for stability as it tended to buckle in standing; He participated well in session, despite knee pain; he is young, motivated to get home, and needs PT, OT and ST; Highly recommend a CIR stay for intensive therapies to maximize independence and safety with mobility, ADLs, and communication  Follow Up Recommendations  CIR     Equipment Recommendations  None recommended by PT    Recommendations for Other Services       Precautions / Restrictions Precautions Precautions: Fall    Mobility  Bed Mobility Overal bed mobility: Needs Assistance Bed Mobility: Supine to Sit;Sit to Supine;Rolling;Sit to Sidelying Rolling: Mod assist;+2 for physical assistance   Supine to sit: Mod assist;+2 for physical assistance;HOB elevated   Sit to sidelying: Mod assist;+2 for physical assistance General bed mobility comments: Very good initiation of getting up by moving LEs towards teh EOB; Mod assist +2 for trunk control initialy and RLE support. Cues for technique. Able to pull lightly with RUE. Minimal activity with RLE to assist. Good scooting technique once upright and able to sit EOB unassisted.  Transfers Overall transfer level: Needs assistance Equipment used: 2 person hand held assist(and use of gati belt and bed pad) Transfers: Sit to/from Stand Sit to Stand: Max  assist;+2 physical assistance;From elevated surface         General transfer comment: max assist +2 for sit<>stand from elevated bed, performed x2. Unable to fully stand from lowest bed setting. Tolerated approx 30 seconds of standing while ped pad was changed. R knee buckle, requiring heavy block; R knee pain as well; started work on weight shifting laterally; fatigued quickly.  Ambulation/Gait                 Stairs             Wheelchair Mobility    Modified Rankin (Stroke Patients Only) Modified Rankin (Stroke Patients Only) Pre-Morbid Rankin Score: Moderate disability Modified Rankin: Severe disability     Balance Overall balance assessment: Needs assistance Sitting-balance support: No upper extremity supported;Feet supported Sitting balance-Leahy Scale: Good       Standing balance-Leahy Scale: Zero                              Cognition Arousal/Alertness: Awake/alert Behavior During Therapy: WFL for tasks assessed/performed Overall Cognitive Status: Within Functional Limits for tasks assessed                                 General Comments: pt able to provide basic PLOF and answer simple questions, appears to be accurate historian, following commands      Exercises      General Comments        Pertinent Vitals/Pain Pain Assessment: Faces Faces Pain Scale: Hurts little more Pain Location: right  knee Pain Descriptors / Indicators: Aching Pain Intervention(s): Monitored during session;Limited activity within patient's tolerance    Home Living                      Prior Function            PT Goals (current goals can now be found in the care plan section) Acute Rehab PT Goals Patient Stated Goal: Wants to go home PT Goal Formulation: With patient Time For Goal Achievement: 03/26/20 Potential to Achieve Goals: Good Progress towards PT goals: Progressing toward goals    Frequency    Min  4X/week      PT Plan Current plan remains appropriate    Co-evaluation              AM-PAC PT "6 Clicks" Mobility   Outcome Measure  Help needed turning from your back to your side while in a flat bed without using bedrails?: A Lot Help needed moving from lying on your back to sitting on the side of a flat bed without using bedrails?: A Lot Help needed moving to and from a bed to a chair (including a wheelchair)?: Total Help needed standing up from a chair using your arms (e.g., wheelchair or bedside chair)?: A Lot Help needed to walk in hospital room?: Total Help needed climbing 3-5 steps with a railing? : Total 6 Click Score: 9    End of Session Equipment Utilized During Treatment: Gait belt Activity Tolerance: Patient tolerated treatment well Patient left: in bed;with call bell/phone within reach;with bed alarm set(bed in semi-chair position) Nurse Communication: Mobility status;Need for lift equipment PT Visit Diagnosis: Difficulty in walking, not elsewhere classified (R26.2);Hemiplegia and hemiparesis;Pain Hemiplegia - Right/Left: Right Hemiplegia - dominant/non-dominant: Non-dominant Hemiplegia - caused by: Cerebral infarction Pain - Right/Left: Right Pain - part of body: Knee     Time: 9381-8299 PT Time Calculation (min) (ACUTE ONLY): 24 min  Charges:  $Therapeutic Activity: 23-37 mins                     Van Clines, PT  Acute Rehabilitation Services Pager [phone] Office [phone]    [name] 03/13/2020, 2:17 PM

## 2020-03-13 NOTE — Progress Notes (Signed)
Nurse paged MD in regards to patient diastolic BP. MD Ordered nurse to hold patient afternoon BP meds. Will continue to monitor. Melony Overly, RN

## 2020-03-13 NOTE — Progress Notes (Signed)
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  Initial CT of the head did not show any acute findings.  Telemetry neurology evaluation was done at [facility], ED there was no large concern for large vessel occlusion or TPA infusion.  Recommended getting a brain MRI for further evaluation to rule out any new CVA.  Patient was transferred to [facility] for an MRI of the brain.  MRI of the brain does not show any acute infarct, but was Positive for progressive deep watershed ischemia and development of left side Wallerian degeneration since July of last year. Underlying chronic multifocal intracranial vessel occlusion discussed the above findings with the neurologist on-call recommended no further work-up at this time and to follow-up therapy evaluations for disposition. Patient seen and examined at bedside, no new events overnight.  Discussed with patient's family yesterday and answered their questions.  Assessment & Plan:   Active Problems:   CVA (cerebral vascular accident) ([place])    Generalized weakness and worsening left hemiparesis MRI of the brain ruled out acute CVA.  Discussed with neurology , recommended therapy evaluations and no further stroke work-up. Continue with Plavix and atorvastatin 40 mg daily. PT, OT evaluations recommending CIR.  CIR consult in place. Outpatient follow-up with neurology recommended.   History of CVA CAD s/p NSTEMI Patient denies any chest pain or shortness of breath at this time.  Continue with Plavix, restarted Coreg and statin.    Essential hypertension Well-controlled  blood pressure parameters, continue with Coreg .    Hyperlipidemia Continue with atorvastatin 40 mg daily.   Body mass index is 45.6 kg/m. Morbid obesity   History of left ICA infarct with residual right-sided hemiparesis and slurred speech and mild aphasia Continue with Plavix, atorvastatin and Coreg.   AKI Probably prerenal/dehydration.  Patient currently hydrated and his renal parameters are back to baseline Baseline creatinine is around 1.6. Patient was admitted with a creatinine of 2.4 after gentle hydration creatinine is back at 1.6.     DVT prophylaxis: Heparin Code Status: Full code Family Communication: None at bedside Disposition Plan:  . Patient came from: Home            . Anticipated d/c place: CIR Barriers to d/c OR conditions which need to be met to effect a safe d/c: CIR evaluation  Consultants:   Curbside consultation with neurologist  Procedures: MRI of the brain without contrast Antimicrobials: None  Subjective: No chest pain or shortness of breath.  No new events  Objective: Vitals:   03/12/20 2350 03/13/20 0214 03/13/20 0341 03/13/20 0900  BP: (!) 86/52 (!) 143/73 133/77 128/75  Pulse: 85 82 81 89  Resp: 19 20 19 18   Temp: 98.4 F (36.9 C) 98.4 F (36.9 C) 98.3 F (36.8 C) 98.2 F (36.8 C)  TempSrc: Oral Oral Oral Oral  SpO2: 92% 94% 97% 98%  Weight:        Intake/Output Summary (Last 24 hours) at 03/13/2020 1005 Last data filed at 03/13/2020 0214 Gross per 24 hour  Intake 500 ml  Output --  Net 500 ml   Filed Weights   03/11/20 1250  Weight: [name])  152.5 kg    Examination:  General exam: Alert and comfortable, not in any kind of distress. Respiratory system: Clear to auscultation bilaterally, no wheezing or rhonchi. Cardiovascular system:  S1s2, RRR, no pedal edema.  Gastrointestinal system: Abdomen is soft, nontender, nondistended bowel sounds are good Central nervous system: A alert oriented, speech is slow, right  hemiparesis, following commands Extremities: No cyanosis or clubbing Skin: No rashes seen Psychiatry: Mood is appropriate  Data Reviewed: I have personally reviewed following labs and imaging studies  CBC: Recent Labs  Lab 03/11/20 1335  WBC 9.6  NEUTROABS 7.3  HGB 12.5*  HCT 39.5  MCV 96.6  PLT 701   Basic Metabolic Panel: Recent Labs  Lab 03/11/20 1335 03/12/20 1459  NA 138 140  K 3.9 4.3  CL 104 107  CO2 23 22  GLUCOSE 113* 116*  BUN 36* 22*  CREATININE 2.41* 1.60*  CALCIUM 9.1 9.0   GFR: Estimated Creatinine Clearance: 85 mL/min (A) (by C-G formula based on SCr of 1.6 mg/dL (H)). Liver Function Tests: Recent Labs  Lab 03/11/20 1335  AST 12*  ALT 12  ALKPHOS 113  BILITOT 0.4  PROT 8.7*  ALBUMIN 3.6   No results for input(s): LIPASE, AMYLASE in the last 168 hours. No results for input(s): AMMONIA in the last 168 hours. Coagulation Profile: Recent Labs  Lab 03/11/20 1335  INR 1.1   Cardiac Enzymes: No results for input(s): CKTOTAL, CKMB, CKMBINDEX, TROPONINI in the last 168 hours. BNP (last 3 results) No results for input(s): PROBNP in the last 8760 hours. HbA1C: No results for input(s): HGBA1C in the last 72 hours. CBG: Recent Labs  Lab 03/11/20 1227  GLUCAP 121*   Lipid Profile: No results for input(s): CHOL, HDL, LDLCALC, TRIG, CHOLHDL, LDLDIRECT in the last 72 hours. Thyroid Function Tests: Recent Labs    03/11/20 1335  TSH 5.449*   Anemia Panel: No results for input(s): VITAMINB12, FOLATE, FERRITIN, TIBC, IRON, RETICCTPCT in the last 72 hours. Sepsis Labs: No results for input(s): PROCALCITON, LATICACIDVEN in the last 168 hours.  Recent Results (from the past 240 hour(s))  SARS CORONAVIRUS 2 (TAT 6-24 HRS) Nasopharyngeal Nasopharyngeal Swab     Status: None   Collection Time: 03/11/20  3:10 PM   Specimen: Nasopharyngeal Swab  Result Value Ref Range Status   SARS Coronavirus 2 NEGATIVE NEGATIVE Final    Comment:  (NOTE) SARS-CoV-2 target nucleic acids are NOT DETECTED. The SARS-CoV-2 RNA is generally detectable in upper and lower respiratory specimens during the acute phase of infection. Negative results do not preclude SARS-CoV-2 infection, do not rule out co-infections with other pathogens, and should not be used as the sole basis for treatment or other patient management decisions. Negative results must be combined with clinical observations, patient history, and epidemiological information. The expected result is Negative. Fact Sheet for Patients: SugarRoll.be Fact Sheet for Healthcare Providers: https://www.woods-mathews.com/ This test is not yet approved or cleared by the [place] FDA and  has been authorized for detection and/or diagnosis of SARS-CoV-2 by FDA under an Emergency Use Authorization (EUA). This EUA will remain  in effect (meaning this test can be used) for the duration of the COVID-19 declaration under Section 56 4(b)(1) of the Act, 21 U.S.C. section 360bbb-3(b)(1), unless the authorization is terminated or revoked sooner. Performed at [facility], [place] [address] [place], [place] [address]          Radiology Studies: [name]  Result Date: 03/11/2020 CLINICAL  DATA:  49 year old male code stroke presentation earlier today. Increased right side weakness. Ataxia. EXAM: MRI HEAD WITHOUT CONTRAST TECHNIQUE: Multiplanar, multiecho pulse sequences of the brain and surrounding structures were obtained without intravenous contrast. COMPARISON:  Head CT earlier today.  Brain MRI 07/11/2019. FINDINGS: Brain: No restricted diffusion or evidence of acute infarction. Progressed and now confluent central white matter T2 and FLAIR hyperintensity in the left hemisphere, with new abnormal signal tracking through the left deep gray nuclei and into the brainstem compatible with Wallerian degeneration (series 9, image 9). Stable  superimposed chronic lacunar type infarcts in the right hemisphere, left cerebellum, left lower pons. No definite cortical encephalomalacia. Numerous chronic microhemorrhages in the bilateral thalami and pons are stable. There is mild new microhemorrhage in the left parietal lobe. No midline shift, mass effect, evidence of mass lesion, ventriculomegaly, extra-axial collection or acute intracranial hemorrhage. Cervicomedullary junction and pituitary are within normal limits. Vascular: Chronically abnormal flow voids of both distal vertebral arteries and the left ICA siphon. Stable major vascular flow voids since July. Skull and upper cervical spine: Stable visible cervical spine, bone marrow signal. Sinuses/Orbits: Orbits are stable and negative. Chronic left maxillary sinusitis. Other: Trace new mastoid air cell fluid on the left. Chronic thickening of the adenoid soft tissues appears stable since July. Other Visible internal auditory structures appear normal. IMPRESSION: 1. Negative for acute infarct. 2. Positive for progressive deep watershed ischemia and development of left side Wallerian degeneration since July of last year. Underlying chronic multifocal intracranial vessel occlusion. 3. Stable superimposed advanced chronic small vessel disease in the deep gray nuclei, brainstem. Electronically Signed   By: Genevie Ann M.D.   On: 03/11/2020 23:20   CT HEAD CODE STROKE WO CONTRAST  Result Date: 03/11/2020 CLINICAL DATA:  Code stroke. Ataxia, stroke suspected. Possible stroke, increased weakness at start today at 10 a.m., right-sided weakness from previous stroke. Patient reports weakness on left side. EXAM: CT HEAD WITHOUT CONTRAST TECHNIQUE: Contiguous axial images were obtained from the base of the skull through the vertex without intravenous contrast. COMPARISON:  Head CT 07/27/2019, brain MRI 07/11/2019. FINDINGS: Brain: The examination is mildly motion degraded. There is no evidence of acute intracranial  hemorrhage. No acute demarcated cortical infarct is identified. No evidence of intracranial mass. No midline shift or extra-axial fluid collection. Redemonstrated extensive chronic ischemic changes within the left cerebral white matter. Redemonstrated chronic lacunar infarct within the right corona radiata/basal ganglia. Also unchanged, there are chronic lacunar infarcts within the left cerebellar hemisphere. Additional ill-defined hypoattenuation within the cerebral white matter is nonspecific, but consistent with chronic small vessel ischemic disease mild generalized parenchymal atrophy. Vascular: No hyperdense vessel.  Atherosclerotic calcifications. Skull: No calvarial fracture or suspicious osseous lesion. Sinuses/Orbits: Mucosal thickening within the dependent left maxillary sinus. No significant mastoid effusion. These results were called by telephone at the time of interpretation on 03/11/2020 at 1:00 pm to provider [facility] , who verbally acknowledged these results. IMPRESSION: No CT evidence of acute intracranial abnormality. Redemonstrated extensive chronic ischemic changes within the left cerebral white matter. Consider brain MRI to exclude acute on chronic ischemia in this region. Redemonstrated chronic lacunar infarcts within the right corona radiata/basal ganglia and left cerebellum. Background generalized parenchymal atrophy and chronic small vessel ischemic disease. Electronically Signed   By: Kellie Simmering DO   On: 03/11/2020 13:00        Scheduled Meds: . amLODipine  10 mg Oral Daily  . atorvastatin  40 mg Oral Q breakfast  .  carvedilol  6.25 mg Oral BID WC  . clopidogrel  75 mg Oral Q breakfast  . heparin  5,000 Units Subcutaneous Q8H  . hydrALAZINE  25 mg Oral TID  . melatonin  9 mg Oral QHS  . mirtazapine  7.5 mg Oral QHS  . senna-docusate  2 tablet Oral QHS   Continuous Infusions:    LOS: 2 days       Hosie Poisson, MD [facility]   To contact the  attending provider between 7A-7P or the covering provider during after hours 7P-7A, please log into the web site [email or web address] and access using universal [place] password for that web site. If you do not have the password, please call the hospital operator.  03/13/2020, 10:05 AM

## 2020-03-14 DIAGNOSIS — I63232 Cerebral infarction due to unspecified occlusion or stenosis of left carotid arteries: Secondary | ICD-10-CM

## 2020-03-14 LAB — T3, FREE: T3, Free: 2.2 pg/mL (ref 2.0–4.4)

## 2020-03-14 NOTE — Consult Note (Signed)
Physical Medicine and Rehabilitation Consult Reason for Consult: Right side weakness Referring Physician: [facility]   HPI: Zachary Mccann is a 49 y.o. right-handed male with history of hypertension and tobacco abuse, CAD status post non-STEMI 04/2018 as well as left ICA occlusion July 2020 with associated CVA causing right-sided hemiparesis and slurred speech maintained on Plavix and received inpatient rehab services 07/17/2019 to 08/12/2019.  Per chart review patient lives with girlfriend.  Two-level home bed and bath main level with ramped entrance.  Able to ambulate with rolling walker prior to admission.  Girlfriend assist with some ADLs.  Presented 03/11/2020 with increasing right side weakness.  CT/MRI negative for acute infarct.  Positive for progressive deep watershed ischemia and development of left side Wallerian degeneration since July of last year.  Admission chemistries with creatinine 2.41 with baseline creatinine 1.6, TSH 5.449, SARS coronavirus negative.  Neurology follow-up work-up ongoing patient currently remains on Plavix for CVA prophylaxis.  Subcutaneous heparin for DVT prophylaxis.  Maintain on a regular diet.  Therapy evaluations completed with recommendations of physical medicine rehab consult.  Patient states that he wishes to go home where his girlfriend can assist him.  She has 6 young children which he states are in school   Review of Systems  Constitutional: Negative for chills and fever.  HENT: Negative for hearing loss.   Eyes: Negative for blurred vision and double vision.  Respiratory: Negative for cough and shortness of breath.   Cardiovascular: Negative for chest pain, palpitations and leg swelling.  Gastrointestinal: Positive for constipation. Negative for heartburn and nausea.  Genitourinary: Negative for flank pain and hematuria.  Musculoskeletal: Positive for myalgias.  Neurological: Positive for speech change and weakness.  All other systems reviewed  and are negative.  Past Medical History:  Diagnosis Date  . CAD (coronary artery disease)    a. s/p NSTEMI in 04/2018 with DES to LCx.   [name] Hypertension   . Myocardial infarction (HCC)   . Pneumonia   . Stroke [facility])    Past Surgical History:  Procedure Laterality Date  . CORONARY STENT INTERVENTION N/A 05/05/2018   Procedure: CORONARY STENT INTERVENTION;  Surgeon: Marykay Lex, MD;  Location: [facility] INVASIVE CV LAB;  Service: Cardiovascular;  Laterality: N/A;  . LEFT HEART CATH AND CORONARY ANGIOGRAPHY N/A 05/05/2018   Procedure: LEFT HEART CATH AND CORONARY ANGIOGRAPHY;  Surgeon: Marykay Lex, MD;  Location: [facility] INVASIVE CV LAB;  Service: Cardiovascular;  Laterality: N/A;  . TEE WITHOUT CARDIOVERSION N/A 09/08/2018   Procedure: TRANSESOPHAGEAL ECHOCARDIOGRAM (TEE);  Surgeon: Laurey Morale, MD;  Location: [facility] ENDOSCOPY;  Service: Cardiovascular;  Laterality: N/A;   Family History  Problem Relation Age of Onset  . Hypertension Mother   . Stroke Mother   . Dementia Mother   . Hypertension Father   . Stroke Father   . Cancer Father   . Alcoholism Father   . Cancer Sister    Social History:  reports that he has quit smoking. His smoking use included cigarettes. He has never used smokeless tobacco. He reports previous alcohol use. He reports previous drug use. Allergies:  Allergies  Allergen Reactions  . Aspirin Swelling   Medications Prior to Admission  Medication Sig Dispense Refill  . amLODipine (NORVASC) 10 MG tablet Take 1 tablet by mouth once daily 90 tablet 0  . atorvastatin (LIPITOR) 40 MG tablet TAKE 1 TABLET BY MOUTH ONCE DAILY AT  6:00  PM 90 tablet 0  . carvedilol (  COREG) 6.25 MG tablet TAKE 1 TABLET BY MOUTH TWICE DAILY WITH MEALS 180 tablet 0  . clopidogrel (PLAVIX) 75 MG tablet Take 1 tablet (75 mg total) by mouth daily. 90 tablet 3  . hydrALAZINE (APRESOLINE) 25 MG tablet TAKE 1 TABLET BY MOUTH THREE TIMES DAILY 90 tablet 3  . Melatonin 10 MG TABS Take 10 mg by  mouth at bedtime.    . mirtazapine (REMERON) 7.5 MG tablet Take 1 tablet (7.5 mg total) by mouth at bedtime. 30 tablet 0  . acetaminophen (TYLENOL) 325 MG tablet Take 2 tablets (650 mg total) by mouth every 4 (four) hours as needed for mild pain (or temp > 37.5 C (99.5 F)). (Patient not taking: Reported on 11/26/2019)    . diclofenac sodium (VOLTAREN) 1 % GEL Apply 2 g topically 4 (four) times daily. (Patient not taking: Reported on 11/26/2019) 2 g 0  . Multiple Vitamin (MULTIVITAMIN WITH MINERALS) TABS tablet Take 1 tablet by mouth daily. (Patient not taking: Reported on 03/11/2020)  0  . senna-docusate (SENOKOT-S) 8.6-50 MG tablet Take 2 tablets by mouth at bedtime. (Patient not taking: Reported on 11/26/2019)      Home: Home Living Family/patient expects to be discharged to:: Inpatient rehab Living Arrangements: Spouse/significant other Available Help at Discharge: Available 24 hours/day, Family Type of Home: House Home Access: Ramped entrance Home Layout: Two level, Able to live on main level with bedroom/bathroom Bathroom Shower/Tub: Health visitor: Standard Home Equipment: Environmental consultant - 2 wheels, Shower seat, Bedside commode, Wheelchair - manual  Functional History: Prior Function Level of Independence: Needs assistance Gait / Transfers Assistance Needed: Able to ambulate with RW PTA ADL's / Homemaking Assistance Needed: Girlfriend assists with all ADLs per pt report. Functional Status:  Mobility: Bed Mobility Overal bed mobility: Needs Assistance Bed Mobility: Supine to Sit, Sit to Supine, Rolling, Sit to Sidelying Rolling: Mod assist, +2 for physical assistance Supine to sit: Mod assist, +2 for physical assistance, HOB elevated Sit to sidelying: Mod assist, +2 for physical assistance General bed mobility comments: Very good initiation of getting up by moving LEs towards teh EOB; Mod assist +2 for trunk control initialy and RLE support. Cues for technique. Able to  pull lightly with RUE. Minimal activity with RLE to assist. Good scooting technique once upright and able to sit EOB unassisted. Transfers Overall transfer level: Needs assistance Equipment used: 2 person hand held assist(and use of gati belt and bed pad) Transfers: Sit to/from Stand Sit to Stand: Max assist, +2 physical assistance, From elevated surface General transfer comment: max assist +2 for sit<>stand from elevated bed, performed x2. Unable to fully stand from lowest bed setting. Tolerated approx 30 seconds of standing while ped pad was changed. R knee buckle, requiring heavy block; R knee pain as well; started work on weight shifting laterally; fatigued quickly.      ADL: ADL Overall ADL's : Needs assistance/impaired Eating/Feeding: Set up, Supervision/ safety Grooming: Minimal assistance, Sitting Upper Body Bathing: Minimal assistance, Sitting Lower Body Bathing: Maximal assistance, +2 for physical assistance, Sitting/lateral leans, Sit to/from stand Upper Body Dressing : Minimal assistance, Moderate assistance, Sitting Upper Body Dressing Details (indicate cue type and reason): donning new gown Lower Body Dressing: Maximal assistance, +2 for physical assistance, Sit to/from stand, Sitting/lateral leans Toileting- Clothing Manipulation and Hygiene: Total assistance, +2 for physical assistance Functional mobility during ADLs: Maximal assistance, +2 for physical assistance, Rolling walker(sit<>stand only) General ADL Comments: pt with worsened R side weakness, speech difficulties, decreased standing  balance  Cognition: Cognition Overall Cognitive Status: Within Functional Limits for tasks assessed Orientation Level: Oriented X4 Cognition Arousal/Alertness: Awake/alert Behavior During Therapy: WFL for tasks assessed/performed Overall Cognitive Status: Within Functional Limits for tasks assessed General Comments: pt able to provide basic PLOF and answer simple questions, appears  to be accurate historian, following commands Difficult to assess due to: Impaired communication  Blood pressure (!) 145/92, pulse 83, temperature 99.1 F (37.3 C), temperature source Oral, resp. rate 18, weight (!) 152.5 kg, SpO2 97 %. Physical Exam  Nursing note and vitals reviewed. Constitutional: He is oriented to person, place, and time. He appears well-developed and well-nourished.  HENT:  Head: Normocephalic and atraumatic.  Eyes: Pupils are equal, round, and reactive to light. Conjunctivae and EOM are normal.  Cardiovascular: Normal rate, regular rhythm and normal heart sounds.  No murmur heard. Respiratory: Effort normal and breath sounds normal. No respiratory distress. He has no wheezes.  GI: Soft. Bowel sounds are normal. He exhibits no distension. There is no abdominal tenderness.  Neurological: He is alert and oriented to person, place, and time. Gait abnormal.  Patient is alert.  Speech is a bit dysarthric.  Follows commands.  Provides his name and age.  3/5 in the right deltoid, bicep, tricep, grip Trace in the right knee extensor and ankle dorsiflexor plantar flexor 0 at the right hip flexor 5/5 in the left deltoid bicep tricep grip hip flexor knee extensor ankle dorsiflexor Sensation light touch is intact in the right upper and right lower limb  Skin: Skin is warm and dry.  Psychiatric: He has a normal mood and affect.    Results for orders placed or performed during the hospital encounter of 03/11/20 (from the past 24 hour(s))  T4, free     Status: None   Collection Time: 03/13/20 11:18 AM  Result Value Ref Range   Free T4 0.95 0.61 - 1.12 ng/dL  Basic metabolic panel     Status: Abnormal   Collection Time: 03/13/20 11:18 AM  Result Value Ref Range   Sodium 139 135 - 145 mmol/L   Potassium 4.0 3.5 - 5.1 mmol/L   Chloride 107 98 - 111 mmol/L   CO2 23 22 - 32 mmol/L   Glucose, Bld 101 (H) 70 - 99 mg/dL   BUN 14 6 - 20 mg/dL   Creatinine, Ser 1.47 (H) 0.61 -  1.24 mg/dL   Calcium 9.2 8.9 - 10.3 mg/dL   GFR calc non Af Amer 55 (L) >60 mL/min   GFR calc Af Amer >60 >60 mL/min   Anion gap 9 5 - 15  Magnesium     Status: None   Collection Time: 03/13/20 11:18 AM  Result Value Ref Range   Magnesium 1.9 1.7 - 2.4 mg/dL   No results found.  Assessment/Plan: Diagnosis: Watershed infarct left ICA/MCA distribution with worsening of right hemiparesis 1. Does the need for close, 24 hr/day medical supervision in concert with the patient's rehab needs make it unreasonable for this patient to be served in a less intensive setting? Yes 2. Co-Morbidities requiring supervision/potential complications: Hypertension, coronary artery disease, severe dysarthria from prior stroke 3. Due to bladder management, bowel management, safety, skin/wound care, disease management, medication administration, pain management and patient education, does the patient require 24 hr/day rehab nursing? Yes 4. Does the patient require coordinated care of a physician, rehab nurse, therapy disciplines of PT, OT, speech to address physical and functional deficits in the context of the above medical diagnosis(es)? Yes  Addressing deficits in the following areas: balance, endurance, locomotion, strength, transferring, bowel/bladder control, bathing, dressing, feeding, toileting, speech and psychosocial support 5. Can the patient actively participate in an intensive therapy program of at least 3 hrs of therapy per day at least 5 days per week? No 6. The potential for patient to make measurable gains while on inpatient rehab is good 7. Anticipated functional outcomes upon discharge from inpatient rehab are min assist  with PT, min assist with OT, min assist with SLP. 8. Estimated rehab length of stay to reach the above functional goals is: 14 to 18 days 9. Anticipated discharge destination: Home 10. Overall Rehab/Functional Prognosis: good  RECOMMENDATIONS: This patient's condition is  appropriate for continued rehabilitative care in the following setting: CIR Patient has agreed to participate in recommended program. No Note that insurance prior authorization may be required for reimbursement for recommended care.  Comment: Patient refusing inpatient rehabilitation.  We discussed the recommendation to help upgrade his functional status.  We discussed that his girlfriend has 6 young children to care for her as well. Mcarthur Rossetti Angiulli, PA-C 03/14/2020  "I have personally performed a face to face diagnostic evaluation of this patient.  Additionally, I have reviewed and concur with the physician assistant's documentation above." Erick Colace M.D. [facility] FAAPM&R (Neuromuscular Med) Diplomate Am Board of Electrodiagnostic Med Fellow Am Board of Interventional Pain

## 2020-03-14 NOTE — Progress Notes (Signed)
Occupational Therapy Treatment Patient Details Name: Zachary Mccann MRN: [id number] DOB: [birth year] Today's Date: 03/14/2020    History of present illness 49 y.o. male with medical history significant for left ICA occlusion in July 2020 with associated CVA causing right-sided hemiparesis and slurred speech as well as mild aphasia, CAD with prior NSTEMI in 2019, hypertension, dyslipidemia, and morbid obesity.  Admitted with worsening weakness of RLE.   OT comments  Patient continues to make steady progress towards goals in skilled OT session. Patient's session encompassed ADLs at sink in stedy as well as weight-shifting in order to promote previous level of independence. Pt continues to complain of R knee pain during activities, and requires multi-modal cues and min increase extra time to follow commands. Pt required max encouragement to incorporate RUE into tasks at sink (primarily due to impaired balance and body schema) but will attempt. Pt would continue to greatly benefit from CIR due to his ability to tolerate three hours of therapy as well as his motivation to return home; will continue to follow acutely.    Follow Up Recommendations  CIR;Supervision/Assistance - 24 hour    Equipment Recommendations  Other (comment)(defer at next venue)    Recommendations for Other Services      Precautions / Restrictions Precautions Precautions: Fall Restrictions Weight Bearing Restrictions: No       Mobility Bed Mobility Overal bed mobility: Needs Assistance Bed Mobility: Supine to Sit     Supine to sit: Mod assist;+2 for physical assistance;HOB elevated     General bed mobility comments: Requires cues for incorporation of R side, however able to hook LLE to bring RLE off of bed, mod assist to come to sitting able to scoot to EOB with increased multimodal cues to weightshift hips  Transfers                      Balance Overall balance assessment: Needs  assistance Sitting-balance support: No upper extremity supported;Feet supported Sitting balance-Leahy Scale: Good     Standing balance support: Bilateral upper extremity supported Standing balance-Leahy Scale: Poor Standing balance comment: Dependent on stedy for support                           ADL either performed or assessed with clinical judgement   ADL Overall ADL's : Needs assistance/impaired     Grooming: Minimal assistance;Sitting;Standing;Oral care;Wash/dry hands Grooming Details (indicate cue type and reason): In stedy at sink, cues to incorporate RUE             Lower Body Dressing: +2 for physical assistance;Sit to/from stand;Sitting/lateral leans;Total assistance Lower Body Dressing Details (indicate cue type and reason): Attempted to doff socks, but unable however can reach down to top of sock to adjust Toilet Transfer: Moderate assistance;+2 for physical assistance Toilet Transfer Details (indicate cue type and reason): Simulated with stedy bed<>chair         Functional mobility during ADLs: Maximal assistance;+2 for physical assistance;Rolling walker General ADL Comments: max A to attempt to stand with RW, in stedy mod A +2 for safety and maintaining balance due to increased R side weakness and R lean     Vision       Perception     Praxis      Cognition Arousal/Alertness: Awake/alert Behavior During Therapy: WFL for tasks assessed/performed Overall Cognitive Status: Within Functional Limits for tasks assessed  General Comments: Due to speech easier to ask yes/no questions, however with increased time can give you an accurate history        Exercises     Shoulder Instructions       General Comments      Pertinent Vitals/ Pain       Pain Assessment: Faces Faces Pain Scale: Hurts even more Pain Location: right knee Pain Descriptors / Indicators: Cramping;Aching;Discomfort Pain  Intervention(s): Limited activity within patient's tolerance;Monitored during session;Repositioned  Home Living                                          Prior Functioning/Environment              Frequency  Min 2X/week        Progress Toward Goals  OT Goals(current goals can now be found in the care plan section)  Progress towards OT goals: Progressing toward goals  Acute Rehab OT Goals Patient Stated Goal: Wants to go home OT Goal Formulation: With patient Time For Goal Achievement: 03/26/20 Potential to Achieve Goals: Good  Plan Discharge plan remains appropriate    Co-evaluation    PT/OT/SLP Co-Evaluation/Treatment: Yes Reason for Co-Treatment: Complexity of the patient's impairments (multi-system involvement);Necessary to address cognition/behavior during functional activity;For patient/therapist safety;To address functional/ADL transfers   OT goals addressed during session: ADL's and self-care;Strengthening/ROM;Proper use of Adaptive equipment and DME      AM-PAC OT "6 Clicks" Daily Activity     Outcome Measure   Help from another person eating meals?: A Little Help from another person taking care of personal grooming?: A Little Help from another person toileting, which includes using toliet, bedpan, or urinal?: Total Help from another person bathing (including washing, rinsing, drying)?: A Lot Help from another person to put on and taking off regular upper body clothing?: A Lot Help from another person to put on and taking off regular lower body clothing?: Total 6 Click Score: 12    End of Session Equipment Utilized During Treatment: Other (comment);Gait belt(stedy)  OT Visit Diagnosis: Other abnormalities of gait and mobility (R26.89);Hemiplegia and hemiparesis;Muscle weakness (generalized) (M62.81);Cognitive communication deficit (R41.841) Symptoms and signs involving cognitive functions: Cerebral infarction Hemiplegia - Right/Left:  Right Hemiplegia - dominant/non-dominant: Non-Dominant Hemiplegia - caused by: Cerebral infarction   Activity Tolerance Patient tolerated treatment well   Patient Left in bed;with call bell/phone within reach;with chair alarm set   Nurse Communication Mobility status        Time: 7616-0737 OT Time Calculation (min): 26 min  Charges: OT General Charges $OT Visit: 1 Visit OT Treatments $Self Care/Home Management : 8-22 mins  Pollyann Glen E. Chriselda Leppert, COTA/L Acute Rehabilitation Services [phone]   Cherlyn Cushing 03/14/2020, 2:11 PM

## 2020-03-14 NOTE — Progress Notes (Addendum)
Inpatient Rehabilitation Admissions Coordinator  I met with patient at bedside for rehab assessment. We discussed the recommendation for continued rehab at [facility]. He states he wants to go home, but gave me permission to call his girlfriend/caregiver, [name]. I spoke with her my phone at patient's bedside. She prefers CIR admit before return home with her and will come visit patient this afternoon to discuss with him.   Danne Baxter, RN, MSN Rehab Admissions Coordinator [phone] 03/14/2020 11:56 AM'  [name] called to let me know that she will come to see patient in the morning to discuss recommendations for CIR when she has caregiver support for her children who are not in school.  Danne Baxter, RN, MSN Rehab Admissions Coordinator [phone] 03/14/2020 12:48 PM

## 2020-03-14 NOTE — Progress Notes (Signed)
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  Initial CT of the head did not show any acute findings.  Telemetry neurology evaluation was done at [facility], ED there was no large concern for large vessel occlusion or TPA infusion.  Recommended getting a brain MRI for further evaluation to rule out any new CVA.  Patient was transferred to [facility] for an MRI of the brain.  MRI of the brain does not show any acute infarct, but was Positive for progressive deep watershed ischemia and development of left side Wallerian degeneration since July of last year. Underlying chronic multifocal intracranial vessel occlusion discussed the above findings with the neurologist on-call recommended no further work-up at this time and to follow-up therapy evaluations for disposition. Patient seen and examined at bedside no new events overnight. CIR evaluation in progress.  Assessment & Plan:   Active Problems:   CVA (cerebral vascular accident) ([place])    Generalized weakness and worsening left hemiparesis MRI of the brain ruled out acute CVA.  Discussed with neurology , recommended therapy evaluations and no further stroke work-up. Continue with Plavix and atorvastatin 40 mg daily. PT, OT evaluations recommending CIR.  CIR consult in place. Outpatient follow-up with neurology recommended. CIR evaluation in progress to see if he is appropriate for their program.   History of CVA CAD s/p NSTEMI Patient denies any chest pain or shortness of breath at this time. Continue with Plavix, restarted Coreg and statin.    Essential  hypertension Blood pressure parameters are good and at goal, continue with Coreg.   Hyperlipidemia Continue with atorvastatin 40 mg daily.   Body mass index is 45.6 kg/m. Morbid obesity   History of left ICA infarct with residual right-sided hemiparesis and slurred speech and mild aphasia Continue with Plavix, atorvastatin and Coreg.   AKI Probably prerenal/dehydration.  Patient currently hydrated and his renal parameters are back to baseline Baseline creatinine is around 1.6. Patient was admitted with a creatinine of 2.4 after gentle hydration creatinine is at 1.47 today   Mild normocytic anemia hemoglobin at 12.5  DVT prophylaxis: Heparin Code Status: Full code Family Communication: None at bedside Disposition Plan:  . Patient came from: Home            . Anticipated d/c place: CIR Barriers to d/c OR conditions which need to be met to effect a safe d/c: CIR evaluation  Consultants:   Curbside consultation with neurologist  Procedures: MRI of the brain without contrast Antimicrobials: None  Subjective: Patient denies any chest pain, shortness of breath, nausea vomiting or abdominal pain  Objective: Vitals:   03/13/20 2317 03/14/20 0341 03/14/20 0854 03/14/20 1012  BP: (!) 135/101 (!) 145/92 139/85 121/82  Pulse: 81 83 85 88  Resp: 18  20   Temp: 98.4 F (36.9 C) 99.1 F (37.3 C) 98.6 F (37 C)   TempSrc: Oral Oral Oral   SpO2: 100% 97% 96%   Weight:       No intake or output data in the 24 hours ending 03/14/20 1545 Filed Weights   03/11/20 1250  Weight: (!) 152.5 kg    Examination:  General  exam: Alert and comfortable, not in any kind of distress. Respiratory system: Clear to auscultation bilaterally, no wheezing or rhonchi Cardiovascular system: S1-S2 heard, regular rate rhythm, no JVD, no pedal edema Gastrointestinal system: Abdomen is soft, nontender, nondistended, bowel sounds normal Central nervous system: Patient is alert, dysarthric right  hemiparesis, following commands Extremities: No cyanosis or clubbing Skin: No rashes seen Psychiatry: Mood is appropriate   Data Reviewed: I have personally reviewed following labs and imaging studies  CBC: Recent Labs  Lab 03/11/20 1335  WBC 9.6  NEUTROABS 7.3  HGB 12.5*  HCT 39.5  MCV 96.6  PLT 469   Basic Metabolic Panel: Recent Labs  Lab 03/11/20 1335 03/12/20 1459 03/13/20 1118  NA 138 140 139  K 3.9 4.3 4.0  CL 104 107 107  CO2 23 22 23   GLUCOSE 113* 116* 101*  BUN 36* 22* 14  CREATININE 2.41* 1.60* 1.47*  CALCIUM 9.1 9.0 9.2  MG  --   --  1.9   GFR: Estimated Creatinine Clearance: 92.5 mL/min (A) (by C-G formula based on SCr of 1.47 mg/dL (H)). Liver Function Tests: Recent Labs  Lab 03/11/20 1335  AST 12*  ALT 12  ALKPHOS 113  BILITOT 0.4  PROT 8.7*  ALBUMIN 3.6   No results for input(s): LIPASE, AMYLASE in the last 168 hours. No results for input(s): AMMONIA in the last 168 hours. Coagulation Profile: Recent Labs  Lab 03/11/20 1335  INR 1.1   Cardiac Enzymes: No results for input(s): CKTOTAL, CKMB, CKMBINDEX, TROPONINI in the last 168 hours. BNP (last 3 results) No results for input(s): PROBNP in the last 8760 hours. HbA1C: No results for input(s): HGBA1C in the last 72 hours. CBG: Recent Labs  Lab 03/11/20 1227  GLUCAP 121*   Lipid Profile: No results for input(s): CHOL, HDL, LDLCALC, TRIG, CHOLHDL, LDLDIRECT in the last 72 hours. Thyroid Function Tests: Recent Labs    03/13/20 1118  FREET4 0.95  T3FREE 2.2   Anemia Panel: No results for input(s): VITAMINB12, FOLATE, FERRITIN, TIBC, IRON, RETICCTPCT in the last 72 hours. Sepsis Labs: No results for input(s): PROCALCITON, LATICACIDVEN in the last 168 hours.  Recent Results (from the past 240 hour(s))  SARS CORONAVIRUS 2 (TAT 6-24 HRS) Nasopharyngeal Nasopharyngeal Swab     Status: None   Collection Time: 03/11/20  3:10 PM   Specimen: Nasopharyngeal Swab  Result Value Ref  Range Status   SARS Coronavirus 2 NEGATIVE NEGATIVE Final    Comment: (NOTE) SARS-CoV-2 target nucleic acids are NOT DETECTED. The SARS-CoV-2 RNA is generally detectable in upper and lower respiratory specimens during the acute phase of infection. Negative results do not preclude SARS-CoV-2 infection, do not rule out co-infections with other pathogens, and should not be used as the sole basis for treatment or other patient management decisions. Negative results must be combined with clinical observations, patient history, and epidemiological information. The expected result is Negative. Fact Sheet for Patients: SugarRoll.be Fact Sheet for Healthcare Providers: https://www.woods-mathews.com/ This test is not yet approved or cleared by the [place] FDA and  has been authorized for detection and/or diagnosis of SARS-CoV-2 by FDA under an Emergency Use Authorization (EUA). This EUA will remain  in effect (meaning this test can be used) for the duration of the COVID-19 declaration under Section 56 4(b)(1) of the Act, 21 U.S.C. section 360bbb-3(b)(1), unless the authorization is terminated or revoked sooner. Performed at [facility], [place] [address] [place], [place] [address]  Radiology Studies: No results found.      Scheduled Meds: . amLODipine  10 mg Oral Daily  . atorvastatin  40 mg Oral Q breakfast  . carvedilol  6.25 mg Oral BID WC  . clopidogrel  75 mg Oral Q breakfast  . heparin  5,000 Units Subcutaneous Q8H  . hydrALAZINE  25 mg Oral TID  . melatonin  9 mg Oral QHS  . mirtazapine  7.5 mg Oral QHS  . senna-docusate  2 tablet Oral QHS   Continuous Infusions:    LOS: 3 days       Hosie Poisson, MD [facility]   To contact the attending provider between 7A-7P or the covering provider during after hours 7P-7A, please log into the web site [email or web address] and access using universal [place]  password for that web site. If you do not have the password, please call the hospital operator.  03/14/2020, 3:45 PM

## 2020-03-14 NOTE — Progress Notes (Signed)
Physical Therapy Treatment Patient Details Name: Zachary Mccann MRN: [id number] DOB: 1971/08/05 Today's Date: 03/14/2020    History of Present Illness 49 y.o. male with medical history significant for left ICA occlusion in July 2020 with associated CVA causing right-sided hemiparesis and slurred speech as well as mild aphasia, CAD with prior NSTEMI in 2019, hypertension, dyslipidemia, and morbid obesity.  Admitted with worsening weakness of RLE.    PT Comments    Pt supine in bed on arrival voicing need to urinate.  Removed male primo fit as he reports this leaks and he was able to void in the urinal.  Focus of session on transfer and pre gt activities.  He remains guarded and anxious due to R sided weakness.  He presents with poor posture and L lateral lean.  Challenged balance with B lateral weight shifting.  He continues to be an excellent candidate for aggressive CIR therapies in a post acute setting.     Follow Up Recommendations  CIR     Equipment Recommendations  None recommended by PT    Recommendations for Other Services Rehab consult     Precautions / Restrictions Precautions Precautions: Fall Restrictions Weight Bearing Restrictions: No    Mobility  Bed Mobility Overal bed mobility: Needs Assistance Bed Mobility: Supine to Sit     Supine to sit: Mod assist;+2 for physical assistance;HOB elevated     General bed mobility comments: Requires cues for incorporation of R side, however able to hook LLE to bring RLE off of bed, mod assist to come to sitting able to scoot to EOB with increased multimodal cues to weightshift hips  Transfers Overall transfer level: Needs assistance Equipment used: Rolling walker (2 wheeled);Ambulation equipment used(RW x1 and sara stedy for continued attempts) Transfers: Sit to/from Stand Sit to Stand: Max assist;+2 physical assistance;From elevated surface         General transfer comment: Pt performed transfer from elevated bed x1  with R LE blocked into extension.  Pt performed standing tolerance x 30 sec, performed additional transfers with sara stedy and he performed with min +2 assistance.  Ambulation/Gait Ambulation/Gait assistance: (Focused on pre gt weight shifting within sara stedy frame.)               Stairs             Wheelchair Mobility    Modified Rankin (Stroke Patients Only)       Balance Overall balance assessment: Needs assistance Sitting-balance support: No upper extremity supported;Feet supported Sitting balance-Leahy Scale: Good     Standing balance support: Bilateral upper extremity supported Standing balance-Leahy Scale: Poor Standing balance comment: Dependent on stedy for support                            Cognition Arousal/Alertness: Awake/alert Behavior During Therapy: WFL for tasks assessed/performed Overall Cognitive Status: Within Functional Limits for tasks assessed                                 General Comments: Due to speech easier to ask yes/no questions, however with increased time can give you an accurate history      Exercises      General Comments        Pertinent Vitals/Pain Pain Assessment: Faces Faces Pain Scale: Hurts even more Pain Location: right knee Pain Descriptors / Indicators: Cramping;Aching;Discomfort Pain Intervention(s): Monitored during  session;Repositioned    Home Living                      Prior Function            PT Goals (current goals can now be found in the care plan section) Acute Rehab PT Goals Patient Stated Goal: Wants to go home Potential to Achieve Goals: Good Progress towards PT goals: Progressing toward goals    Frequency    Min 4X/week      PT Plan Current plan remains appropriate    Co-evaluation PT/OT/SLP Co-Evaluation/Treatment: Yes Reason for Co-Treatment: Complexity of the patient's impairments (multi-system involvement) PT goals addressed during  session: Mobility/safety with mobility OT goals addressed during session: ADL's and self-care      AM-PAC PT "6 Clicks" Mobility   Outcome Measure  Help needed turning from your back to your side while in a flat bed without using bedrails?: A Lot Help needed moving from lying on your back to sitting on the side of a flat bed without using bedrails?: A Lot Help needed moving to and from a bed to a chair (including a wheelchair)?: Total Help needed standing up from a chair using your arms (e.g., wheelchair or bedside chair)?: Total Help needed to walk in hospital room?: Total Help needed climbing 3-5 steps with a railing? : Total 6 Click Score: 8    End of Session Equipment Utilized During Treatment: Gait belt Activity Tolerance: Patient tolerated treatment well Patient left: in chair;with call bell/phone within reach;with chair alarm set Nurse Communication: Mobility status;Need for lift equipment(use of sara stedy) PT Visit Diagnosis: Difficulty in walking, not elsewhere classified (R26.2);Hemiplegia and hemiparesis;Pain Hemiplegia - Right/Left: Right Hemiplegia - dominant/non-dominant: Non-dominant Hemiplegia - caused by: Cerebral infarction Pain - Right/Left: Right Pain - part of body: Knee     Time: 7619-5093 PT Time Calculation (min) (ACUTE ONLY): 28 min  Charges:  $Therapeutic Activity: 8-22 mins                     Zachary Mccann , PTA Acute Rehabilitation Services Pager [phone] Office [phone]     Zachary Mccann Artis Delay 03/14/2020, 2:38 PM

## 2020-03-14 NOTE — H&P (Addendum)
Physical Medicine and Rehabilitation Admission H&P    Chief Complaint  Patient presents with  . Weakness  . Code Stroke  : HPI: Zachary Mccann is a 49 year old right-handed male with history of hypertension and tobacco abuse, CAD status post non-STEMI 04/2018 as well as left ICA occlusion in July 2020 with 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  History taken from chart review due to dysarthria. Patient lives with his girlfriend.  Two-level home bed and bath on main level with ramped entrance.  Girlfriend did assist with some ADLs.  He do have 6 children in the home and for reportedly handicapped.  He presented on 03/11/20 with increasing right hemiparesis. MRI brain personally reviewed, unremarkable for acute intracranial process.  Per report, positive for progressive deep watershed ischemia and development of left side Wallerian degeneration since July of last year.  Admission chemistries with creatinine 2.41 with baseline 1.6, TSH 5.449, SARS coronavirus negative.  Neurology follow-up currently maintained on Plavix for CVA prophylaxis.  Subcutaneous heparin for DVT prophylaxis.  Maintained on a regular diet.  Renal function stabilized with gentle IV fluids latest creatinine 1.47.  Therapy evaluations completed and patient was admitted for a comprehensive rehab program. Please see preadmission assessment from earlier today as well.   Review of Systems  Constitutional: Negative for chills and fever.  HENT: Negative for hearing loss.   Eyes: Negative for blurred vision and double vision.  Respiratory: Negative for cough and shortness of breath.   Cardiovascular: Negative for chest pain and palpitations.  Gastrointestinal: Positive for constipation. Negative for heartburn, nausea and vomiting.  Genitourinary: Negative for dysuria, flank pain  and hematuria.  Musculoskeletal: Positive for myalgias.  Skin: Negative for rash.  Neurological: Positive for speech change, focal weakness and weakness.  Psychiatric/Behavioral: The patient has insomnia.    Past Medical History:  Diagnosis Date  . CAD (coronary artery disease)    a. s/p NSTEMI in 04/2018 with DES to LCx.   [name] Hypertension   . Myocardial infarction (HCC)   . Pneumonia   . Stroke [facility])    Past Surgical History:  Procedure Laterality Date  . CORONARY STENT INTERVENTION N/A 05/05/2018   Procedure: CORONARY STENT INTERVENTION;  Surgeon: Marykay Lex, MD;  Location: [facility] INVASIVE CV LAB;  Service: Cardiovascular;  Laterality: N/A;  . LEFT HEART CATH AND CORONARY ANGIOGRAPHY N/A 05/05/2018   Procedure: LEFT HEART CATH AND CORONARY ANGIOGRAPHY;  Surgeon: Marykay Lex, MD;  Location: [facility] INVASIVE CV LAB;  Service: Cardiovascular;  Laterality: N/A;  . TEE WITHOUT CARDIOVERSION N/A 09/08/2018   Procedure: TRANSESOPHAGEAL ECHOCARDIOGRAM (TEE);  Surgeon: Laurey Morale, MD;  Location: [facility] ENDOSCOPY;  Service: Cardiovascular;  Laterality: N/A;   Family History  Problem Relation Age of Onset  . Hypertension Mother   . Stroke Mother   . Dementia Mother   . Hypertension Father   . Stroke Father   . Cancer Father   . Alcoholism Father   . Cancer Sister    Social History:  reports that he has quit smoking. His smoking use included cigarettes. He has never used smokeless tobacco. He reports previous alcohol use. He reports previous drug use. Allergies:  Allergies  Allergen Reactions  . Aspirin Swelling   Medications Prior to Admission  Medication Sig Dispense Refill  . amLODipine (NORVASC) 10 MG tablet Take 1 tablet by mouth once  daily 90 tablet 0  . atorvastatin (LIPITOR) 40 MG tablet TAKE 1 TABLET BY MOUTH ONCE DAILY AT  6:00  PM 90 tablet 0  . carvedilol (COREG) 6.25 MG tablet TAKE 1 TABLET BY MOUTH TWICE DAILY WITH MEALS 180 tablet 0  . clopidogrel (PLAVIX) 75 MG  tablet Take 1 tablet (75 mg total) by mouth daily. 90 tablet 3  . hydrALAZINE (APRESOLINE) 25 MG tablet TAKE 1 TABLET BY MOUTH THREE TIMES DAILY 90 tablet 3  . Melatonin 10 MG TABS Take 10 mg by mouth at bedtime.    . mirtazapine (REMERON) 7.5 MG tablet Take 1 tablet (7.5 mg total) by mouth at bedtime. 30 tablet 0  . acetaminophen (TYLENOL) 325 MG tablet Take 2 tablets (650 mg total) by mouth every 4 (four) hours as needed for mild pain (or temp > 37.5 C (99.5 F)). (Patient not taking: Reported on 11/26/2019)    . diclofenac sodium (VOLTAREN) 1 % GEL Apply 2 g topically 4 (four) times daily. (Patient not taking: Reported on 11/26/2019) 2 g 0  . Multiple Vitamin (MULTIVITAMIN WITH MINERALS) TABS tablet Take 1 tablet by mouth daily. (Patient not taking: Reported on 03/11/2020)  0  . senna-docusate (SENOKOT-S) 8.6-50 MG tablet Take 2 tablets by mouth at bedtime. (Patient not taking: Reported on 11/26/2019)      Drug Regimen Review Drug regimen was reviewed and remains appropriate with no significant issues identified  Home: Home Living Family/patient expects to be discharged to:: Inpatient rehab Living Arrangements: (girlfriend, Copy) Available Help at Discharge: Available 24 hours/day, Family Type of Home: House Home Access: Ramped entrance Home Layout: Two level, Able to live on main level with bedroom/bathroom Bathroom Shower/Tub: Health visitor: Standard Bathroom Accessibility: Yes Home Equipment: Environmental consultant - 2 wheels, Shower seat, Bedside commode, Wheelchair - manual  Lives With: Significant other(and Automotive engineer' 6 children with 4 having disabilities)   Functional History: Prior Function Level of Independence: Needs assistance Gait / Transfers Assistance Needed: Able to ambulate with RW PTA ADL's / Homemaking Assistance Needed: Girlfriend assists with all ADLs per pt report. Comments: not working since CVA 06/2019; was a Chartered certified accountant  Functional Status:  Mobility: Bed  Mobility Overal bed mobility: Needs Assistance Bed Mobility: Sit to Supine Rolling: Mod assist, +2 for safety/equipment Supine to sit: Mod assist, +2 for physical assistance Sit to sidelying: Mod assist, +2 for physical assistance General bed mobility comments: Mod assistance +2 to return to supine, assistance to lift B LEs back to bed and maintain hookyling for bridging hips to L side of bed. Transfers Overall transfer level: Needs assistance Equipment used: None Transfers: Lateral/Scoot Transfers Sit to Stand: Max assist, +2 physical assistance, From elevated surface  Lateral/Scoot Transfers: Max assist, +2 safety/equipment General transfer comment: x2 scoots the returned to supine and bridged hips the rest of the way into his bed. Ambulation/Gait Ambulation/Gait assistance: (Focused on pre gt weight shifting within sara stedy frame.)    ADL: ADL Overall ADL's : Needs assistance/impaired Eating/Feeding: Set up, Supervision/ safety Grooming: Minimal assistance, Sitting, Standing, Oral care, Wash/dry hands Grooming Details (indicate cue type and reason): In stedy at sink, cues to incorporate RUE Upper Body Bathing: Minimal assistance, Sitting Lower Body Bathing: Maximal assistance, +2 for physical assistance, Sitting/lateral leans, Sit to/from stand Upper Body Dressing : Minimal assistance, Moderate assistance, Sitting Upper Body Dressing Details (indicate cue type and reason): donning new gown Lower Body Dressing: +2 for physical assistance, Sit to/from stand, Sitting/lateral leans, Total assistance Lower Body Dressing  Details (indicate cue type and reason): Attempted to doff socks, but unable however can reach down to top of sock to adjust Toilet Transfer: Moderate assistance, +2 for physical assistance Toilet Transfer Details (indicate cue type and reason): Simulated with stedy bed<>chair Toileting- Clothing Manipulation and Hygiene: Total assistance, +2 for physical  assistance Functional mobility during ADLs: Maximal assistance, +2 for physical assistance, Rolling walker General ADL Comments: max A to attempt to stand with RW, in stedy mod A +2 for safety and maintaining balance due to increased R side weakness and R lean  Cognition: Cognition Overall Cognitive Status: Within Functional Limits for tasks assessed Orientation Level: Oriented X4 Cognition Arousal/Alertness: Awake/alert Behavior During Therapy: WFL for tasks assessed/performed Overall Cognitive Status: Within Functional Limits for tasks assessed General Comments: speech is more slurred and difficult to understand but he able to write on white board to communicate when speech is not understood Difficult to assess due to: Impaired communication  Physical Exam: Blood pressure 135/85, pulse 95, temperature 98.6 F (37 C), temperature source Oral, resp. rate 18, weight (!) 152.5 kg, SpO2 97 %. Physical Exam  Vitals reviewed. Constitutional: He appears well-developed.  Obese  HENT:  Head: Normocephalic and atraumatic.  Eyes: EOM are normal. Right eye exhibits no discharge. Left eye exhibits no discharge.  Respiratory: Effort normal. No respiratory distress.  GI: Soft. He exhibits no distension.  Musculoskeletal:     Cervical back: Normal range of motion and neck supple.     Comments: LE edema  Neurological: He is alert.  Dysarthria Follows commands.   Provides name and age.   He was cooperative with exam. Motor: RUE: Shoulder abduction 2/5, distal 3 -/5 RLE: HF, KE 1+/5, ADF 2-/5 LUE/LLE: 5/5 proximal to distal Sensation intact to light touch  Skin: Skin is warm and dry.  Psychiatric: His behavior is normal. His speech is slurred.    No results found for this or any previous visit (from the past 48 hour(s)). DG Knee 1-2 Views Right  Result Date: 03/15/2020 CLINICAL DATA:  Right knee pain and swelling EXAM: RIGHT KNEE - 1-2 VIEW COMPARISON:  None. FINDINGS: No fracture or  malalignment. Mild to moderate medial and patellofemoral degenerative changes. Mild lateral joint space degenerative change. Trace knee effusion. IMPRESSION: No acute osseous abnormality. Tricompartment arthritis with trace knee effusion. Electronically Signed   By: [name] Pang M.D.   On: 03/15/2020 16:48       Medical Problem List and Plan: 1.  Increased right side hemiparesis with dysarthria secondary to watershed infarct left ICA/MCA distribution  -patient may shower  -ELOS/Goals: 12-17 days/Min A  Admit to CIR 2.  Antithrombotics: -DVT/anticoagulation: Subcutaneous heparin  -antiplatelet therapy: Plavix 75 mg daily 3. Pain Management: Tylenol as needed 4. Mood: Team support  -antipsychotic agents: N/A 5. Neuropsych: This patient is capable of making decisions on his own behalf. 6. Skin/Wound Care: Routine skin checks 7. Fluids/Electrolytes/Nutrition: Routine in and outs. CMP ordered. 8.  CAD with stenting.  Continue Plavix. 9.  Hypertension.  Norvasc 10 mg daily, Coreg 6.25 mg twice daily, hydralazine 25 mg 3 times daily.    Monitor with increased mobility 10.  Hyperlipidemia.  Continue Lipitor 11.  AKI on CKD.  Creatinine baselin 1.60.    CMP ordered for tomorrow 12.  Morbid Obesity.  BMI 45.60.  Dietary follow-up. Encourage weight loss 13. Sleep disturbance  Remeron 7.5 mg nightly, melatonin 9 mg nightly  Charlton Amor, PA-C 03/16/2020  I have personally performed a face to  face diagnostic evaluation, including, but not limited to relevant history and physical exam findings, of this patient and developed relevant assessment and plan.  Additionally, I have reviewed and concur with the physician assistant's documentation above.  Delice Lesch, MD, ABPMR

## 2020-03-14 NOTE — Evaluation (Signed)
Speech Language Pathology Evaluation Patient Details Name: Zachary Mccann MRN: [id number] DOB: [birth year] Today's Date: 03/14/2020 Time: 4782-9562 SLP Time Calculation (min) (ACUTE ONLY): 13 min  Problem List:  Patient Active Problem List   Diagnosis Date Noted  . CVA (cerebral vascular accident) (HCC) 03/11/2020  . Leukocytosis   . Hemiparesis affecting dominant side as late effect of stroke (HCC)   . Labile blood pressure   . Sleep disturbance   . Benign hypertensive heart and kidney disease with diastolic CHF, NYHA class II and CKD stage III (HCC)   . Chronic systolic congestive heart failure (HCC)   . Morbid obesity (HCC)   . Uncontrolled hypertension   . Dysphagia, post-stroke   . Cardiomegaly   . Acute systolic congestive heart failure (HCC)   . 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 Cerebral embolism with cerebral infarction 07/11/2019  . Acute CVA (cerebrovascular accident) (HCC) 07/11/2019  . Slurred speech 07/10/2019  . Cardiomyopathy (HCC) 11/20/2018  . History of stroke 09/06/2018  . Tobacco use 09/06/2018  . History of non-ST elevation myocardial infarction (NSTEMI) 05/05/2018  . Severe Vitamin D deficiency 04/02/2018  . Community acquired pneumonia of left lower lobe of lung 04/02/2018  . CKD (chronic kidney disease), stage III (HCC) = Secondary to Hypertension 04/02/2018  . Pneumonia 03/30/2018  . Metabolic acidosis 03/30/2018  . AKI (acute kidney injury) (HCC) 03/30/2018  . N&V (nausea and vomiting) 03/30/2018  . Essential hypertension 03/30/2018   Past Medical History:  Past Medical History:  Diagnosis Date  . CAD (coronary artery disease)    a. s/p NSTEMI in 04/2018 with DES to LCx.   [name] Hypertension   . Myocardial infarction (HCC)   . Pneumonia   . Stroke [facility])    Past Surgical History:  Past Surgical History:  Procedure Laterality Date  . CORONARY STENT INTERVENTION N/A 05/05/2018   Procedure: CORONARY STENT INTERVENTION;   Surgeon: Marykay Lex, MD;  Location: [facility] INVASIVE CV LAB;  Service: Cardiovascular;  Laterality: N/A;  . LEFT HEART CATH AND CORONARY ANGIOGRAPHY N/A 05/05/2018   Procedure: LEFT HEART CATH AND CORONARY ANGIOGRAPHY;  Surgeon: Marykay Lex, MD;  Location: [facility] INVASIVE CV LAB;  Service: Cardiovascular;  Laterality: N/A;  . TEE WITHOUT CARDIOVERSION N/A 09/08/2018   Procedure: TRANSESOPHAGEAL ECHOCARDIOGRAM (TEE);  Surgeon: Laurey Morale, MD;  Location: [facility] ENDOSCOPY;  Service: Cardiovascular;  Laterality: N/A;   HPI:  49 y.o. male with medical history significant for left ICA occlusion in July 2020 with associated CVA causing right-sided hemiparesis and slurred speech as well as mild aphasia, CAD with prior NSTEMI in 2019, hypertension, dyslipidemia, and morbid obesity.  Admitted with worsening weakness of RLE.   Assessment / Plan / Recommendation Clinical Impression   Patient presents with expressive > receptive aphasia and apraxia of speech. Per assessment results and pt's report, pt appears to be functioning at his baseline. Per CIR notes in 2020, pt's deficits also seem to have improved following his prior hospital stay. Pt noted with 100% accuracy during confrontational naming tasks. His intelligbility is reduced at the word level, but is functional within context. Pt also reports he has family available at home 24hrs/day to help him as needed. All further needs can be addressed at the next venue, where familiar therapists may more thoroughly assess and determine baseline vs new acute needs. Will s/o at this time.     SLP Assessment  SLP Recommendation/Assessment: All further Speech Lanaguage Pathology  needs can be  addressed in the next venue of care SLP Visit Diagnosis: Apraxia (R48.2);Aphasia (R47.01)    Follow Up Recommendations  Inpatient Rehab    Frequency and Duration           SLP Evaluation Cognition  Overall Cognitive Status: Within Functional Limits for tasks  assessed Orientation Level: Oriented X4       Comprehension  Auditory Comprehension Overall Auditory Comprehension: Appears within functional limits for tasks assessed    Expression Expression Primary Mode of Expression: Verbal Verbal Expression Overall Verbal Expression: Impaired at baseline Level of Generative/Spontaneous Verbalization: Phrase Repetition: No impairment Naming: Impairment Responsive: 76-100% accurate Verbal Errors: Aware of errors Interfering Components: Speech intelligibility Effective Techniques: Semantic cues;Sentence completion;Articulatory cues   Oral / Motor  Oral Motor/Sensory Function Overall Oral Motor/Sensory Function: Within functional limits Motor Speech Overall Motor Speech: Impaired at baseline Respiration: Within functional limits Phonation: Normal Resonance: Within functional limits Articulation: Impaired Level of Impairment: Phrase Intelligibility: Intelligibility reduced Word: 50-74% accurate Phrase: 50-74% accurate Sentence: 50-74% accurate Motor Planning: Impaired Level of Impairment: Word Motor Speech Errors: Aware Effective Techniques: Over-articulate   GO                   Aline August, Student SLP Office: [phone]  03/14/2020, 10:18 AM

## 2020-03-15 ENCOUNTER — Inpatient Hospital Stay (HOSPITAL_COMMUNITY): Payer: Medicaid Other

## 2020-03-15 NOTE — PMR Pre-admission (Addendum)
PMR Admission Coordinator Pre-Admission Assessment  Patient: Zachary Mccann is an 49 y.o., male MRN: [id number] DOB: [birth year] Height:   Weight: (!) 152.5 kg              Insurance Information HMO:     PPO:      PCP:      IPA:      80/20:      OTHER:  PRIMARY: Medicaid of [place]      Policy#: [id number]      Subscriber: pt Benefits:  Phone #: passport one online     Name: 3/30 Eff. Date: active 03/15/2020    [facility]  Medicaid Application Date:       Case Manager:  Disability Application Date:       Case Worker:   The "Data Collection Information Summary" for patients in Inpatient Rehabilitation Facilities with attached "Privacy Act Statement-Health Care Records" was provided and verbally reviewed with: N/A  Emergency Contact Information Contact Information    Name Relation Home Work Mobile   [place] Niece   [phone]   [place], [place] Brother [phone]  [phone]   [name] Significant other   [phone]   [name], [name]   [phone]     Current Medical History  Patient Admitting Diagnosis: CVA  History of Present Illness:  Zachary Mccann is a 49 year old right-handed male with history of hypertension and tobacco abuse, CAD status post non-STEMI 04/2018 as well as left ICA occlusion in July 2020 with 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  Per chart review patient lives with his girlfriend.  Two-level home bed and bath on main level with ramped entrance.  Girlfriend did assist with some ADLs.  He do have 6 children in the home and for reportedly handicapped.  Presented 03/11/2020 with increasing right-sided weakness.  CT/MRI negative for acute infarction.  Positive for progressive deep watershed ischemia and development of left side Wallerian degeneration since July of last year.  Admission chemistries  with creatinine 2.41 with baseline 1.6, TSH 5.449, SARS coronavirus negative.  Neurology follow-up currently maintained on Plavix for CVA prophylaxis.  Subcutaneous heparin for DVT prophylaxis.  Maintained on a regular diet.  Renal function stabilized with gentle IV fluids latest creatinine 1.47.    Complete NIHSS TOTAL: 8 Glasgow Coma Scale Score: 15  Past Medical History  Past Medical History:  Diagnosis Date  . CAD (coronary artery disease)    a. s/p NSTEMI in 04/2018 with DES to LCx.   [name] Hypertension   . Myocardial infarction (HCC)   . Pneumonia   . Stroke [facility])     Family History  family history includes Alcoholism in his father; Cancer in his father and sister; Dementia in his mother; Hypertension in his father and mother; Stroke in his father and mother.  Prior Rehab/Hospitalizations:  Has the patient had prior rehab or hospitalizations prior to admission? Yes  CIR with Dr Eliane Decree team 06/2019 for 26 days. Went home at min to mod assist level overall  Has the patient had major surgery during 100 days prior to admission? No  Current Medications   Current Facility-Administered Medications:  .  acetaminophen (TYLENOL) tablet 650 mg, 650 mg, Oral, Q6H PRN, 650 mg at 03/15/20 2033 **OR** acetaminophen (TYLENOL) suppository 650 mg, 650 mg, Rectal, Q6H PRN, Sherryll Burger, Pratik D, DO .  amLODipine (NORVASC) tablet 10 mg, 10 mg, Oral, Daily,  Kathlen Mody, MD, 10 mg at 03/15/20 0932 .  atorvastatin (LIPITOR) tablet 40 mg, 40 mg, Oral, Q breakfast, Sherryll Burger, Pratik D, DO, 40 mg at 03/16/20 0856 .  carvedilol (COREG) tablet 6.25 mg, 6.25 mg, Oral, BID WC, Kathlen Mody, MD, 6.25 mg at 03/16/20 0856 .  clopidogrel (PLAVIX) tablet 75 mg, 75 mg, Oral, Q breakfast, Sherryll Burger, Pratik D, DO, 75 mg at 03/16/20 0856 .  heparin injection 5,000 Units, 5,000 Units, Subcutaneous, Q8H, Shah, Pratik D, DO, 5,000 Units at 03/16/20 0545 .  hydrALAZINE (APRESOLINE) tablet 25 mg, 25 mg, Oral, TID, Kathlen Mody, MD, 25  mg at 03/15/20 2035 .  melatonin tablet 9 mg, 9 mg, Oral, QHS, Kathlen Mody, MD, 9 mg at 03/15/20 2035 .  mirtazapine (REMERON) tablet 7.5 mg, 7.5 mg, Oral, QHS, Kathlen Mody, MD, 7.5 mg at 03/15/20 2034 .  ondansetron (ZOFRAN) tablet 4 mg, 4 mg, Oral, Q6H PRN **OR** ondansetron (ZOFRAN) injection 4 mg, 4 mg, Intravenous, Q6H PRN, Sherryll Burger, Pratik D, DO .  senna-docusate (Senokot-S) tablet 2 tablet, 2 tablet, Oral, QHS, Kathlen Mody, MD, 2 tablet at 03/15/20 2033  Patients Current Diet:  Diet Order            Diet - low sodium heart healthy        Diet Heart Room service appropriate? Yes; Fluid consistency: Thin  Diet effective now              Precautions / Restrictions Precautions Precautions: Fall Restrictions Weight Bearing Restrictions: No   Has the patient had 2 or more falls or a fall with injury in the past year?No  Prior Activity Level Limited Community (1-2x/wk): home bound; used RW; did not drive  Prior Functional Level Prior Function Level of Independence: Needs assistance Gait / Transfers Assistance Needed: Able to ambulate with RW PTA ADL's / Homemaking Assistance Needed: Girlfriend assists with all ADLs per pt report. Comments: not working since CVA 06/2019; was a Chartered certified accountant  Self Care: Did the patient need help bathing, dressing, using the toilet or eating?  Needed some help  Indoor Mobility: Did the patient need assistance with walking from room to room (with or without device)? Needed some help  Stairs: Did the patient need assistance with internal or external stairs (with or without device)? Needed some help  Functional Cognition: Did the patient need help planning regular tasks such as shopping or remembering to take medications? Needed some help  Home Assistive Devices / Equipment Home Assistive Devices/Equipment: None Home Equipment: Walker - 2 wheels, Shower seat, Bedside commode, Wheelchair - manual  Prior Device Use: Indicate devices/aids used by  the patient prior to current illness, exacerbation or injury? Walker  Current Functional Level Cognition  Overall Cognitive Status: Within Functional Limits for tasks assessed Difficult to assess due to: Impaired communication Orientation Level: Oriented X4 General Comments: speech is more slurred and difficult to understand but he able to write on white board to communicate when speech is not understood    Extremity Assessment (includes Sensation/Coordination)  Upper Extremity Assessment: RUE deficits/detail RUE Deficits / Details: 2/5 shoulder flexion, 3/5 elbow/wrist/hand. pt self reports that RUE is now weaker compared to baseline (pt with baseline R side weakness) RUE Coordination: decreased fine motor, decreased gross motor  Lower Extremity Assessment: Defer to PT evaluation RLE Deficits / Details: 3/5 ankle df and pf, 2+/5 hip flexion, trace knee flexion/extension RLE Coordination: decreased gross motor    ADLs  Overall ADL's : Needs assistance/impaired Eating/Feeding: Set up, Supervision/  safety Grooming: Minimal assistance, Sitting, Standing, Oral care, Wash/dry hands Grooming Details (indicate cue type and reason): In stedy at sink, cues to incorporate RUE Upper Body Bathing: Minimal assistance, Sitting Lower Body Bathing: Maximal assistance, +2 for physical assistance, Sitting/lateral leans, Sit to/from stand Upper Body Dressing : Minimal assistance, Moderate assistance, Sitting Upper Body Dressing Details (indicate cue type and reason): donning new gown Lower Body Dressing: +2 for physical assistance, Sit to/from stand, Sitting/lateral leans, Total assistance Lower Body Dressing Details (indicate cue type and reason): Attempted to doff socks, but unable however can reach down to top of sock to adjust Toilet Transfer: Moderate assistance, +2 for physical assistance Toilet Transfer Details (indicate cue type and reason): Simulated with stedy bed<>chair Toileting- Clothing  Manipulation and Hygiene: Total assistance, +2 for physical assistance Functional mobility during ADLs: Maximal assistance, +2 for physical assistance, Rolling walker General ADL Comments: max A to attempt to stand with RW, in stedy mod A +2 for safety and maintaining balance due to increased R side weakness and R lean    Mobility  Overal bed mobility: Needs Assistance Bed Mobility: Sit to Supine Rolling: Mod assist, +2 for safety/equipment Supine to sit: Mod assist, +2 for physical assistance Sit to sidelying: Mod assist, +2 for physical assistance General bed mobility comments: Mod assistance +2 to return to supine, assistance to lift B LEs back to bed and maintain hookyling for bridging hips to L side of bed.    Transfers  Overall transfer level: Needs assistance Equipment used: None Transfers: Lateral/Scoot Transfers Sit to Stand: Max assist, +2 physical assistance, From elevated surface  Lateral/Scoot Transfers: Max assist, +2 safety/equipment General transfer comment: x2 scoots the returned to supine and bridged hips the rest of the way into his bed.    Ambulation / Gait / Stairs / Wheelchair Mobility  Ambulation/Gait Ambulation/Gait assistance: (Focused on pre gt weight shifting within sara stedy frame.)    Posture / Balance Balance Overall balance assessment: Needs assistance Sitting-balance support: No upper extremity supported, Feet supported Sitting balance-Leahy Scale: Good Standing balance support: Bilateral upper extremity supported Standing balance-Leahy Scale: Poor Standing balance comment: Dependent on stedy for support    Special needs/care consideration BiPAP/CPAP n/a CPM n/a Continuous Drip IV n/a Dialysis n/a Life Vest n/a Oxygen n/a Special Bed n/a Trach Size n/a Wound Vac n/a Skin intact Bowel mgmt: incontinent Bladder mgmt:incontinent Diabetic mgmt n/a Behavioral consideration  N/a Chemo/radiation  N/a Designated visitor is Copy, girlfriend    Previous Home Environment  Living Arrangements: (girlfriend, Copy)  Lives With: Significant other(and EVettes' 6 children with 4 having disabilities) Available Help at Discharge: Available 24 hours/day, Family Type of Home: House Home Layout: Two level, Able to live on main level with bedroom/bathroom Home Access: Ramped entrance Bathroom Shower/Tub: Health visitor: Standard Bathroom Accessibility: Yes How Accessible: Accessible via walker Home Care Services: No  Discharge Living Setting Plans for Discharge Living Setting: Patient's home, Lives with (comment)(girlfriend, [name] and her 6 children with 4 having disabili) Type of Home at Discharge: House Discharge Home Layout: One level, Able to live on main level with bedroom/bathroom Discharge Home Access: Ramped entrance Discharge Bathroom Shower/Tub: Walk-in shower Discharge Bathroom Toilet: Standard Discharge Bathroom Accessibility: Yes How Accessible: Accessible via walker Does the patient have any problems obtaining your medications?: No  Social/Family/Support Systems Patient Roles: Partner Contact Information: [name] Anticipated Caregiver: Event organiser Information: see above Ability/Limitations of Caregiver: min to mod assist level Caregiver Availability: 24/7 Discharge Plan Discussed with  Primary Caregiver: Yes Is Caregiver In Agreement with Plan?: Yes Does Caregiver/Family have Issues with Lodging/Transportation while Pt is in Rehab?: No  Goals/Additional Needs Patient/Family Goal for Rehab: min PT, OT and SLP Expected length of stay: ELOS 14 to 18 days, but patient states he will rethink it at 7 days Pt/Family Agrees to Admission and willing to participate: Yes Program Orientation Provided & Reviewed with Pt/Caregiver Including Roles  & Responsibilities: Yes  Decrease burden of Care through IP rehab admission: n/a  Possible need for SNF placement upon discharge:  n/a  Patient Condition: This patient's medical and functional status has changed since the consult dated 03/14/2020 in which the Rehabilitation Physician determined and documented that the patient was potentially appropriate for intensive rehabilitative care in an inpatient rehabilitation facility. Issues have been addressed and update has been discussed with Dr. Posey Pronto and patient now appropriate for inpatient rehabilitation. Will admit to inpatient rehab today.   Preadmission Screen Completed By:  Cleatrice Burke, RN, 03/16/2020 10:42 AM ______________________________________________________________________   Discussed status with Dr. Posey Pronto on 03/16/2020 at  1042 and received approval for admission today.  Admission Coordinator:  Cleatrice Burke, time 3888 Date 03/16/2020

## 2020-03-15 NOTE — Progress Notes (Signed)
Physical Therapy Treatment Patient Details Name: Zachary Mccann MRN: [id number] DOB: 10/03/1971 Today's Date: 03/15/2020    History of Present Illness 49 y.o. male with medical history significant for left ICA occlusion in July 2020 with associated CVA causing right-sided hemiparesis and slurred speech as well as mild aphasia, CAD with prior NSTEMI in 2019, hypertension, dyslipidemia, and morbid obesity.  Admitted with worsening weakness of RLE.    PT Comments    Pt supine in bed on arrival.  He reports he is very tired because he only slept x 2hours but he was aggreeable to transfer training.  Continue to recommend aggressive rehab at CIR to maximize functional gains before return home.  Plan for standing tolerance training and pre gt activities next session.     Follow Up Recommendations  CIR     Equipment Recommendations  None recommended by PT    Recommendations for Other Services       Precautions / Restrictions Precautions Precautions: Fall Restrictions Weight Bearing Restrictions: No    Mobility  Bed Mobility Overal bed mobility: Needs Assistance Bed Mobility: Supine to Sit Rolling: Mod assist;+2 for safety/equipment         General bed mobility comments: Requires cues for incorporation of R side, however able to hook LLE to bring RLE off of bed, mod assist to come to sitting able to scoot to EOB with increased multimodal cues to weightshift hips  Transfers Overall transfer level: Needs assistance Equipment used: Ambulation equipment used(sara stedy) Transfers: Sit to/from Stand Sit to Stand: Max assist;+2 physical assistance;From elevated surface         General transfer comment: Performed from edge of bed with max +2 to rise into standing.  Pt presents with weight shift forward and flexed hips.  Cues for upper trunk extension and B hip extension.  Ambulation/Gait                 Stairs             Wheelchair Mobility    Modified Rankin  (Stroke Patients Only)       Balance Overall balance assessment: Needs assistance Sitting-balance support: No upper extremity supported;Feet supported Sitting balance-Leahy Scale: Good       Standing balance-Leahy Scale: Poor Standing balance comment: Dependent on stedy for support                            Cognition Arousal/Alertness: Awake/alert Behavior During Therapy: WFL for tasks assessed/performed Overall Cognitive Status: Within Functional Limits for tasks assessed                                 General Comments: speech is more slurred and difficult to understand but he able to write on white board to communicate when speech is not understood      Exercises      General Comments        Pertinent Vitals/Pain Pain Assessment: Faces Faces Pain Scale: Hurts even more Pain Location: right knee Pain Descriptors / Indicators: Cramping;Aching;Discomfort Pain Intervention(s): Monitored during session;Repositioned    Home Living                      Prior Function            PT Goals (current goals can now be found in the care plan section) Acute Rehab PT Goals  Patient Stated Goal: Wants to go home Potential to Achieve Goals: Good Progress towards PT goals: Progressing toward goals    Frequency    Min 4X/week      PT Plan Current plan remains appropriate    Co-evaluation              AM-PAC PT "6 Clicks" Mobility   Outcome Measure  Help needed turning from your back to your side while in a flat bed without using bedrails?: A Lot Help needed moving from lying on your back to sitting on the side of a flat bed without using bedrails?: A Lot Help needed moving to and from a bed to a chair (including a wheelchair)?: Total Help needed standing up from a chair using your arms (e.g., wheelchair or bedside chair)?: Total Help needed to walk in hospital room?: Total Help needed climbing 3-5 steps with a railing? :  Total 6 Click Score: 8    End of Session Equipment Utilized During Treatment: Gait belt Activity Tolerance: Patient tolerated treatment well Patient left: in chair;with call bell/phone within reach;with chair alarm set Nurse Communication: Mobility status;Need for lift equipment PT Visit Diagnosis: Difficulty in walking, not elsewhere classified (R26.2);Hemiplegia and hemiparesis;Pain Hemiplegia - Right/Left: Right Hemiplegia - dominant/non-dominant: Non-dominant Hemiplegia - caused by: Cerebral infarction Pain - Right/Left: Right Pain - part of body: Knee     Time: 1203-1219 PT Time Calculation (min) (ACUTE ONLY): 16 min  Charges:  $Therapeutic Activity: 8-22 mins                     Erasmo Leventhal , PTA Acute Rehabilitation Services Pager [phone] Office [phone]     Callan Yontz Eli Hose 03/15/2020, 2:40 PM

## 2020-03-15 NOTE — Progress Notes (Signed)
Physical Therapy Treatment Patient Details Name: Zachary Mccann MRN: [id number] DOB: 1971/06/17 Today's Date: 03/15/2020    History of Present Illness 49 y.o. male with medical history significant for left ICA occlusion in July 2020 with associated CVA causing right-sided hemiparesis and slurred speech as well as mild aphasia, CAD with prior NSTEMI in 2019, hypertension, dyslipidemia, and morbid obesity.  Admitted with worsening weakness of RLE.    PT Comments    Pt seated in recliner and RN attempting OOB to recliner.  PTA entered room to assist nursing.  He was unable to stand without sara stedy so utilized lateral scoot from bed to recliner to assist nursing.  He continues to require +2 external assistance to mobilize and continues to benefit from placement at CIR.     Follow Up Recommendations  CIR     Equipment Recommendations  None recommended by PT    Recommendations for Other Services Rehab consult     Precautions / Restrictions Precautions Precautions: Fall Restrictions Weight Bearing Restrictions: No    Mobility  Bed Mobility Overal bed mobility: Needs Assistance Bed Mobility: Sit to Supine Rolling: Mod assist;+2 for safety/equipment   Sit to supinet: Mod assist;+2 for physical assistance     General bed mobility comments: Mod assistance +2 to return to supine, assistance to lift B LEs back to bed and maintain hookyling for bridging hips to L side of bed.  Transfers Overall transfer level: Needs assistance Equipment used: None Transfers: Lateral/Scoot Transfers         Lateral/Scoot Transfers: Max assist;+2 safety/equipment General transfer comment: x2 scoots the returned to supine and bridged hips the rest of the way into his bed.  Ambulation/Gait                 Stairs             Wheelchair Mobility    Modified Rankin (Stroke Patients Only)       Balance Overall balance assessment: Needs assistance Sitting-balance support: No  upper extremity supported;Feet supported Sitting balance-Leahy Scale: Good       Standing balance-Leahy Scale: Poor Standing balance comment: Dependent on stedy for support                            Cognition Arousal/Alertness: Awake/alert Behavior During Therapy: WFL for tasks assessed/performed Overall Cognitive Status: Within Functional Limits for tasks assessed                                 General Comments: speech is more slurred and difficult to understand but he able to write on white board to communicate when speech is not understood      Exercises      General Comments        Pertinent Vitals/Pain Pain Assessment: Faces Faces Pain Scale: Hurts little more Pain Location: right knee Pain Descriptors / Indicators: Cramping;Aching;Discomfort Pain Intervention(s): Monitored during session;Repositioned    Home Living                      Prior Function            PT Goals (current goals can now be found in the care plan section) Acute Rehab PT Goals Patient Stated Goal: Wants to go home Potential to Achieve Goals: Good Progress towards PT goals: Progressing toward goals    Frequency  Min 4X/week      PT Plan Current plan remains appropriate    Co-evaluation              AM-PAC PT "6 Clicks" Mobility   Outcome Measure  Help needed turning from your back to your side while in a flat bed without using bedrails?: A Lot Help needed moving from lying on your back to sitting on the side of a flat bed without using bedrails?: A Lot Help needed moving to and from a bed to a chair (including a wheelchair)?: Total Help needed standing up from a chair using your arms (e.g., wheelchair or bedside chair)?: Total Help needed to walk in hospital room?: Total Help needed climbing 3-5 steps with a railing? : Total 6 Click Score: 8    End of Session Equipment Utilized During Treatment: Gait belt Activity Tolerance:  Patient tolerated treatment well Patient left: in bed;with bed alarm set;with call bell/phone within reach Nurse Communication: Mobility status;Need for lift equipment PT Visit Diagnosis: Difficulty in walking, not elsewhere classified (R26.2);Hemiplegia and hemiparesis;Pain Hemiplegia - Right/Left: Right Hemiplegia - dominant/non-dominant: Non-dominant Hemiplegia - caused by: Cerebral infarction Pain - Right/Left: Right Pain - part of body: Knee     Time: 1545-1555 PT Time Calculation (min) (ACUTE ONLY): 10 min  Charges:  $Therapeutic Activity: 8-22 mins                     [name] , PTA Acute Rehabilitation Services Pager [phone] Office [phone]     Traquan Duarte Artis Delay 03/15/2020, 4:27 PM

## 2020-03-15 NOTE — Progress Notes (Signed)
Inpatient Rehabilitation Admissions Coordinator  I met with patient with his girlfriend, [name] at bedside. Patient is now in agreement to admit to CIR. Bed is not available today. I am hopeful for bed to be available in the next 24 to 48 hrs.  Danne Baxter, RN, MSN Rehab Admissions Coordinator [phone] 03/15/2020 10:34 AM

## 2020-03-15 NOTE — Progress Notes (Signed)
CM consulted for medication assistance. Pt has medicaid and most of his medications cost around $3. CM is not able to further assist with his medications.

## 2020-03-15 NOTE — Progress Notes (Signed)
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  Initial CT of the head did not show any acute findings.  Telemetry neurology evaluation was done at [facility], ED there was no large concern for large vessel occlusion or TPA infusion.  Recommended getting a brain MRI for further evaluation to rule out any new CVA.  Patient was transferred to [facility] for an MRI of the brain.  MRI of the brain does not show any acute infarct, but was Positive for progressive deep watershed ischemia and development of left side Wallerian degeneration since July of last year. Underlying chronic multifocal intracranial vessel occlusion. discussed the above findings with the neurologist on-call recommended no further work-up at this time and to follow-up therapy evaluations for disposition. PT/OT Evaluations recommending CIR. Patient initially refused inpatient rehab, but later on agreed . CIR evaluation in progress.    Assessment & Plan:   Active Problems:   Essential hypertension   History of stroke   Benign hypertensive heart and kidney disease with diastolic CHF, NYHA class II and CKD stage III (HCC)   Morbid obesity (HCC)   CVA (cerebral vascular accident) ([place])    Generalized weakness and worsening left hemiparesis MRI of the brain ruled out acute CVA.  Discussed with neurology , recommended therapy evaluations and no further stroke work-up. Continue with Plavix and atorvastatin 40 mg daily. PT, OT evaluations recommending CIR.  CIR consult in place. Outpatient follow-up with neurology recommended. CIR evaluation in progress to  see if he is appropriate for their program.   History of CVA CAD s/p NSTEMI Pt denies any chest pain or sob,  Continue with Plavix,coreg and atorvastatin.     Essential hypertension BP parameters are at goal continue with Coreg, hydralazine.   Hyperlipidemia Continue with atorvastatin 40 mg daily.   Body mass index is 45.6 kg/m. Morbid obesity   History of left ICA infarct with residual right-sided hemiparesis and slurred speech and dysarthria: Continue with Plavix, atorvastatin and Coreg.   AKI Probably prerenal/dehydration.  Patient currently hydrated and his renal parameters are back to baseline Baseline creatinine is around 1.6. Patient was admitted with a creatinine of 2.4 after gentle hydration creatinine is at 1.47  No new labs today.    Mild normocytic anemia hemoglobin at 12.5  Constipation:  Senna docusate.   Abnormal TSH: Normal free t3 and t4.    DVT prophylaxis: Heparin Code Status: Full code Family Communication: None at bedside, spoke with her girlfriend over the phone.  Disposition Plan:  . Patient came from: Home            . Anticipated d/c place: CIR Barriers to d/c OR conditions which need to be met to effect a safe d/c: CIR evaluation  Consultants:   Curbside consultation with neurologist  Procedures: MRI of the brain without contrast  Antimicrobials: None  Subjective: No chest pain or sob, no nausea, vomiting or abdominal pain. Agreeable to CIR today. Work up in progress.   Objective: Vitals:   03/15/20 0434 03/15/20 0831 03/15/20 0925 03/15/20 1236  BP: 131/90 (!) 138/53 (!) 130/108 115/87  Pulse: 83 83 81 85  Resp:  19 15 20 18   Temp: 98.4 F (36.9 C) 98.7 F (37.1 C) 98.4 F (36.9 C) 98.4 F (36.9 C)  TempSrc: Oral Oral Oral Oral  SpO2: 100% 100% 97% 94%  Weight:        Intake/Output Summary (Last 24 hours) at 03/15/2020 1334 Last data filed at 03/14/2020 2210 Gross per 24 hour  Intake 200 ml  Output 650 ml  Net  -450 ml   Filed Weights   03/11/20 1250  Weight: (!) 152.5 kg    Examination:  General exam: alert and comfortable,laying in bed.  Respiratory system: clear to auscultation bilaterally , no wheezing or rhonchi.  Cardiovascular system: S1 S2 heard, RRR, no JVD, no pedal edema.  Gastrointestinal system: Abdomen is soft, non tender non distended, Bowel sounds wnl. Central nervous system: Patient is alert, dysarthric, right hemiparesis, ale to follow commands.  Extremities: N o cyanosis or clubbing.  Skin: No rashes seen Psychiatry: Mood Appropriate    Data Reviewed: I have personally reviewed following labs and imaging studies  CBC: Recent Labs  Lab 03/11/20 1335  WBC 9.6  NEUTROABS 7.3  HGB 12.5*  HCT 39.5  MCV 96.6  PLT 295   Basic Metabolic Panel: Recent Labs  Lab 03/11/20 1335 03/12/20 1459 03/13/20 1118  NA 138 140 139  K 3.9 4.3 4.0  CL 104 107 107  CO2 23 22 23   GLUCOSE 113* 116* 101*  BUN 36* 22* 14  CREATININE 2.41* 1.60* 1.47*  CALCIUM 9.1 9.0 9.2  MG  --   --  1.9   GFR: Estimated Creatinine Clearance: 92.5 mL/min (A) (by C-G formula based on SCr of 1.47 mg/dL (H)). Liver Function Tests: Recent Labs  Lab 03/11/20 1335  AST 12*  ALT 12  ALKPHOS 113  BILITOT 0.4  PROT 8.7*  ALBUMIN 3.6   No results for input(s): LIPASE, AMYLASE in the last 168 hours. No results for input(s): AMMONIA in the last 168 hours. Coagulation Profile: Recent Labs  Lab 03/11/20 1335  INR 1.1   Cardiac Enzymes: No results for input(s): CKTOTAL, CKMB, CKMBINDEX, TROPONINI in the last 168 hours. BNP (last 3 results) No results for input(s): PROBNP in the last 8760 hours. HbA1C: No results for input(s): HGBA1C in the last 72 hours. CBG: Recent Labs  Lab 03/11/20 1227  GLUCAP 121*   Lipid Profile: No results for input(s): CHOL, HDL, LDLCALC, TRIG, CHOLHDL, LDLDIRECT in the last 72 hours. Thyroid Function Tests: Recent Labs    03/13/20 1118  FREET4 0.95    T3FREE 2.2   Anemia Panel: No results for input(s): VITAMINB12, FOLATE, FERRITIN, TIBC, IRON, RETICCTPCT in the last 72 hours. Sepsis Labs: No results for input(s): PROCALCITON, LATICACIDVEN in the last 168 hours.  Recent Results (from the past 240 hour(s))  SARS CORONAVIRUS 2 (TAT 6-24 HRS) Nasopharyngeal Nasopharyngeal Swab     Status: None   Collection Time: 03/11/20  3:10 PM   Specimen: Nasopharyngeal Swab  Result Value Ref Range Status   SARS Coronavirus 2 NEGATIVE NEGATIVE Final    Comment: (NOTE) SARS-CoV-2 target nucleic acids are NOT DETECTED. The SARS-CoV-2 RNA is generally detectable in upper and lower respiratory specimens during the acute phase of infection. Negative results do not preclude SARS-CoV-2 infection, do not rule out co-infections with other pathogens, and should not be used as the sole basis for treatment or other patient management decisions. Negative results must be combined with clinical observations, patient history, and epidemiological information. The expected result is Negative. Fact  Sheet for Patients: SugarRoll.be Fact Sheet for Healthcare Providers: https://www.woods-mathews.com/ This test is not yet approved or cleared by the [place] FDA and  has been authorized for detection and/or diagnosis of SARS-CoV-2 by FDA under an Emergency Use Authorization (EUA). This EUA will remain  in effect (meaning this test can be used) for the duration of the COVID-19 declaration under Section 56 4(b)(1) of the Act, 21 U.S.C. section 360bbb-3(b)(1), unless the authorization is terminated or revoked sooner. Performed at [facility], [place] [address], [place], [place] [address]          Radiology Studies: No results found.      Scheduled Meds: . amLODipine  10 mg Oral Daily  . atorvastatin  40 mg Oral Q breakfast  . carvedilol  6.25 mg Oral BID WC  . clopidogrel  75 mg Oral Q breakfast  .  heparin  5,000 Units Subcutaneous Q8H  . hydrALAZINE  25 mg Oral TID  . melatonin  9 mg Oral QHS  . mirtazapine  7.5 mg Oral QHS  . senna-docusate  2 tablet Oral QHS   Continuous Infusions:    LOS: 4 days       Hosie Poisson, MD [facility]   To contact the attending provider between 7A-7P or the covering provider during after hours 7P-7A, please log into the web site [email or web address] and access using universal [place] password for that web site. If you do not have the password, please call the hospital operator.  03/15/2020, 1:34 PM

## 2020-03-16 ENCOUNTER — Inpatient Hospital Stay (HOSPITAL_COMMUNITY)
Admission: RE | Admit: 2020-03-16 | Discharge: 2020-04-01 | DRG: 057 | Disposition: A | Discharge status: Home / Self Care | Payer: Medicaid Other | Source: Intra-hospital | Attending: Physical Medicine & Rehabilitation | Admitting: Physical Medicine & Rehabilitation

## 2020-03-16 DIAGNOSIS — N189 Chronic kidney disease, unspecified: Secondary | ICD-10-CM | POA: Diagnosis present

## 2020-03-16 DIAGNOSIS — I10 Essential (primary) hypertension: Secondary | ICD-10-CM | POA: Diagnosis not present

## 2020-03-16 DIAGNOSIS — G479 Sleep disorder, unspecified: Secondary | ICD-10-CM | POA: Diagnosis not present

## 2020-03-16 DIAGNOSIS — Z7902 Long term (current) use of antithrombotics/antiplatelets: Secondary | ICD-10-CM | POA: Diagnosis not present

## 2020-03-16 DIAGNOSIS — I69351 Hemiplegia and hemiparesis following cerebral infarction affecting right dominant side: Secondary | ICD-10-CM | POA: Diagnosis present

## 2020-03-16 DIAGNOSIS — E785 Hyperlipidemia, unspecified: Secondary | ICD-10-CM | POA: Diagnosis present

## 2020-03-16 DIAGNOSIS — I6389 Other cerebral infarction: Secondary | ICD-10-CM | POA: Insufficient documentation

## 2020-03-16 DIAGNOSIS — I252 Old myocardial infarction: Secondary | ICD-10-CM | POA: Diagnosis not present

## 2020-03-16 DIAGNOSIS — I129 Hypertensive chronic kidney disease with stage 1 through stage 4 chronic kidney disease, or unspecified chronic kidney disease: Secondary | ICD-10-CM | POA: Diagnosis present

## 2020-03-16 DIAGNOSIS — D649 Anemia, unspecified: Secondary | ICD-10-CM | POA: Diagnosis present

## 2020-03-16 DIAGNOSIS — N183 Chronic kidney disease, stage 3 unspecified: Secondary | ICD-10-CM | POA: Diagnosis not present

## 2020-03-16 DIAGNOSIS — I2510 Atherosclerotic heart disease of native coronary artery without angina pectoris: Secondary | ICD-10-CM | POA: Diagnosis present

## 2020-03-16 DIAGNOSIS — N179 Acute kidney failure, unspecified: Secondary | ICD-10-CM | POA: Diagnosis present

## 2020-03-16 DIAGNOSIS — I63512 Cerebral infarction due to unspecified occlusion or stenosis of left middle cerebral artery: Secondary | ICD-10-CM

## 2020-03-16 DIAGNOSIS — I6930 Unspecified sequelae of cerebral infarction: Secondary | ICD-10-CM | POA: Diagnosis not present

## 2020-03-16 DIAGNOSIS — F801 Expressive language disorder: Secondary | ICD-10-CM

## 2020-03-16 DIAGNOSIS — Z79899 Other long term (current) drug therapy: Secondary | ICD-10-CM

## 2020-03-16 DIAGNOSIS — M1711 Unilateral primary osteoarthritis, right knee: Secondary | ICD-10-CM | POA: Diagnosis present

## 2020-03-16 DIAGNOSIS — F809 Developmental disorder of speech and language, unspecified: Secondary | ICD-10-CM | POA: Diagnosis present

## 2020-03-16 DIAGNOSIS — Z6841 Body Mass Index (BMI) 40.0 and over, adult: Secondary | ICD-10-CM

## 2020-03-16 DIAGNOSIS — Z87891 Personal history of nicotine dependence: Secondary | ICD-10-CM | POA: Diagnosis not present

## 2020-03-16 DIAGNOSIS — R0902 Hypoxemia: Secondary | ICD-10-CM | POA: Diagnosis not present

## 2020-03-16 DIAGNOSIS — I69322 Dysarthria following cerebral infarction: Secondary | ICD-10-CM | POA: Diagnosis not present

## 2020-03-16 DIAGNOSIS — R4701 Aphasia: Secondary | ICD-10-CM

## 2020-03-16 DIAGNOSIS — Z955 Presence of coronary angioplasty implant and graft: Secondary | ICD-10-CM

## 2020-03-16 DIAGNOSIS — M25561 Pain in right knee: Secondary | ICD-10-CM

## 2020-03-16 DIAGNOSIS — R739 Hyperglycemia, unspecified: Secondary | ICD-10-CM | POA: Diagnosis not present

## 2020-03-16 DIAGNOSIS — Z8673 Personal history of transient ischemic attack (TIA), and cerebral infarction without residual deficits: Secondary | ICD-10-CM

## 2020-03-16 DIAGNOSIS — K5900 Constipation, unspecified: Secondary | ICD-10-CM

## 2020-03-16 DIAGNOSIS — I5022 Chronic systolic (congestive) heart failure: Secondary | ICD-10-CM

## 2020-03-16 DIAGNOSIS — G8191 Hemiplegia, unspecified affecting right dominant side: Secondary | ICD-10-CM

## 2020-03-16 LAB — CREATININE, SERUM
Creatinine, Ser: 2.11 mg/dL — ABNORMAL HIGH (ref 0.61–1.24)
GFR calc Af Amer: 41 mL/min — ABNORMAL LOW (ref >60)
GFR calc non Af Amer: 36 mL/min — ABNORMAL LOW (ref >60)

## 2020-03-16 LAB — CBC
HCT: 40 % (ref 39.0–52.0)
Hemoglobin: 13.1 g/dL (ref 13.0–17.0)
MCH: 30.8 pg (ref 26.0–34.0)
MCHC: 32.8 g/dL (ref 30.0–36.0)
MCV: 93.9 fL (ref 80.0–100.0)
Platelets: 289 10*3/uL (ref 150–400)
RBC: 4.26 MIL/uL (ref 4.22–5.81)
RDW: 12.4 % (ref 11.5–15.5)
WBC: 8.7 10*3/uL (ref 4.0–10.5)
nRBC: 0 % (ref 0.0–0.2)

## 2020-03-16 MED ORDER — HEPARIN SODIUM (PORCINE) 5000 UNIT/ML IJ SOLN
5000.0000 [IU] | Freq: Three times a day (TID) | INTRAMUSCULAR | Status: DC
  Administered 2020-03-16 – 2020-04-01 (×47): 5000 [IU] via SUBCUTANEOUS
  Filled 2020-03-16 (×47): qty 1

## 2020-03-16 MED ORDER — ACETAMINOPHEN 325 MG PO TABS
650.0000 mg | ORAL_TABLET | Freq: Four times a day (QID) | ORAL | Status: DC | PRN
  Administered 2020-03-17 – 2020-03-21 (×5): 650 mg via ORAL
  Filled 2020-03-16 (×5): qty 2

## 2020-03-16 MED ORDER — CLOPIDOGREL BISULFATE 75 MG PO TABS
75.0000 mg | ORAL_TABLET | Freq: Every day | ORAL | Status: DC
  Administered 2020-03-17 – 2020-04-01 (×16): 75 mg via ORAL
  Filled 2020-03-16 (×16): qty 1

## 2020-03-16 MED ORDER — CARVEDILOL 6.25 MG PO TABS
6.2500 mg | ORAL_TABLET | Freq: Two times a day (BID) | ORAL | Status: DC
  Administered 2020-03-17 – 2020-04-01 (×31): 6.25 mg via ORAL
  Filled 2020-03-16 (×31): qty 1

## 2020-03-16 MED ORDER — ONDANSETRON HCL 4 MG PO TABS
4.0000 mg | ORAL_TABLET | Freq: Four times a day (QID) | ORAL | Status: DC | PRN
  Administered 2020-03-19: 16:00:00 4 mg via ORAL
  Filled 2020-03-16: qty 1

## 2020-03-16 MED ORDER — SENNOSIDES-DOCUSATE SODIUM 8.6-50 MG PO TABS
2.0000 | ORAL_TABLET | Freq: Every day | ORAL | Status: DC
  Administered 2020-03-16 – 2020-03-31 (×16): 2 via ORAL
  Filled 2020-03-16 (×16): qty 2

## 2020-03-16 MED ORDER — SORBITOL 70 % SOLN
30.0000 mL | Freq: Every day | Status: DC | PRN
  Administered 2020-03-17: 30 mL via ORAL
  Filled 2020-03-16: qty 30

## 2020-03-16 MED ORDER — ATORVASTATIN CALCIUM 40 MG PO TABS
40.0000 mg | ORAL_TABLET | Freq: Every day | ORAL | Status: DC
  Administered 2020-03-17 – 2020-04-01 (×16): 40 mg via ORAL
  Filled 2020-03-16 (×16): qty 1

## 2020-03-16 MED ORDER — ONDANSETRON HCL 4 MG/2ML IJ SOLN: 4.0000 mg | Freq: Four times a day (QID) | INTRAMUSCULAR | Status: DC | PRN

## 2020-03-16 MED ORDER — ACETAMINOPHEN 650 MG RE SUPP: 650.0000 mg | Freq: Four times a day (QID) | RECTAL | Status: DC | PRN

## 2020-03-16 MED ORDER — MIRTAZAPINE 15 MG PO TABS
7.5000 mg | ORAL_TABLET | Freq: Every day | ORAL | Status: DC
  Administered 2020-03-16 – 2020-03-31 (×16): 7.5 mg via ORAL
  Filled 2020-03-16 (×16): qty 1

## 2020-03-16 MED ORDER — AMLODIPINE BESYLATE 10 MG PO TABS
10.0000 mg | ORAL_TABLET | Freq: Every day | ORAL | Status: DC
  Administered 2020-03-17 – 2020-04-01 (×16): 10 mg via ORAL
  Filled 2020-03-16 (×16): qty 1

## 2020-03-16 MED ORDER — HYDRALAZINE HCL 25 MG PO TABS
25.0000 mg | ORAL_TABLET | Freq: Three times a day (TID) | ORAL | Status: DC
  Administered 2020-03-16 – 2020-04-01 (×47): 25 mg via ORAL
  Filled 2020-03-16 (×48): qty 1

## 2020-03-16 MED ORDER — HEPARIN SODIUM (PORCINE) 5000 UNIT/ML IJ SOLN: 5000.0000 [IU] | Freq: Three times a day (TID) | INTRAMUSCULAR | Status: DC

## 2020-03-16 MED ORDER — MELATONIN 3 MG PO TABS
9.0000 mg | ORAL_TABLET | Freq: Every day | ORAL | Status: DC
  Administered 2020-03-16 – 2020-03-25 (×10): 9 mg via ORAL
  Filled 2020-03-16 (×10): qty 3

## 2020-03-16 NOTE — TOC Transition Note (Signed)
Transition of Care [facility]) - CM/SW Discharge Note   Patient Details  Name: Zachary Mccann MRN: [id number] Date of Birth: [birth year]  Transition of Care [facility]) CM/SW Contact:  Kermit Balo, RN Phone Number: 03/16/2020, 10:38 AM   Clinical Narrative:    Pt discharging to CIR today. CM signing off.    Final next level of care: IP Rehab Facility Barriers to Discharge: No Barriers Identified   Patient Goals and CMS Choice        Discharge Placement                       Discharge Plan and Services                                     Social Determinants of Health (SDOH) Interventions     Readmission Risk Interventions No flowsheet data found.

## 2020-03-16 NOTE — Progress Notes (Signed)
Report given to [facility] on the 4th floor and taken in bed with assist of [employer].

## 2020-03-16 NOTE — IPOC Note (Signed)
Individualized overall Plan of Care [facility]) Patient Details Name: Zachary Mccann MRN: [id number] DOB: Apr 23, 1971  Admitting Diagnosis: Cerebral infarction, watershed distribution, unilateral, chronic  Hospital Problems: Principal Problem:   Cerebral infarction, watershed distribution, unilateral, chronic Active Problems:   Benign essential HTN      Functional Problem List: Nursing Bladder, Bowel, Endurance, Medication Management, Motor, Pain, Safety  PT Balance, Endurance, Motor, Pain, Perception, Safety, Sensory, Skin Integrity  OT Balance, Endurance, Motor, Pain, Perception, Cognition  SLP Linguistic, Motor  TR         Basic ADL's: OT Grooming, Bathing, Dressing, Toileting     Advanced  ADL's: OT       Transfers: PT Bed to Chair, Bed Mobility, Car, Chief Operating Officer: PT Emergency planning/management officer, Ambulation, Stairs     Additional Impairments: OT Fuctional Use of Upper Extremity  SLP Communication expression    TR      Anticipated Outcomes Item Anticipated Outcome  Self Feeding no goal, pt is set up  Swallowing      Basic self-care  min A UB self care, max A LB self care  Toileting  max A   Bathroom Transfers min A to BSC and/or toilet  Bowel/Bladder  min assist  Transfers  min assist  Locomotion  supervision w/c mobility; mod assist gait  Communication  Mod A at word level  Cognition     Pain  less than 3  Safety/Judgment  min assist   Therapy Plan: PT Intensity: Minimum of 1-2 x/day ,45 to 90 minutes PT Frequency: 5 out of 7 days PT Duration Estimated Length of Stay: 17-21 days OT Intensity: Minimum of 1-2 x/day, 45 to 90 minutes OT Frequency: 5 out of 7 days OT Duration/Estimated Length of Stay: 20-21 days SLP Intensity: Minumum of 1-2 x/day, 30 to 90 minutes SLP Frequency: 3 to 5 out of 7 days SLP Duration/Estimated Length of Stay: 21 days    Team Interventions: Nursing Interventions Patient/Family Education, Bladder  Management, Bowel Management, Disease Management/Prevention, Pain Management, Medication Management, Cognitive Remediation/Compensation, Dysphagia/Aspiration Precaution Training, Discharge Planning, Psychosocial Support  PT interventions Ambulation/gait training, Training and development officer, Cognitive remediation/compensation, Community reintegration, Discharge planning, Disease management/prevention, DME/adaptive equipment instruction, Functional mobility training, Functional electrical stimulation, Neuromuscular re-education, Pain management, Patient/family education, Psychosocial support, Skin care/wound management, Splinting/orthotics, Stair training, Therapeutic Activities, Therapeutic Exercise, UE/LE Strength taining/ROM, UE/LE Coordination activities, Wheelchair propulsion/positioning, Visual/perceptual remediation/compensation  OT Interventions Training and development officer, Cognitive remediation/compensation, Discharge planning, DME/adaptive equipment instruction, Functional mobility training, Psychosocial support, Pain management, Neuromuscular re-education, Self Care/advanced ADL retraining, Therapeutic Activities, Therapeutic Exercise, UE/LE Strength taining/ROM, Splinting/orthotics, Patient/family education, UE/LE Coordination activities  SLP Interventions Speech/Language facilitation, Patient/family education  TR Interventions    SW/CM Interventions     Barriers to Discharge MD  Medical stability, Weight, and Dysarthria  Nursing Incontinence    PT Decreased caregiver support    OT      SLP      SW       Team Discharge Planning: Destination: PT-Home ,OT- Home , SLP-Home Projected Follow-up: PT-Home health PT, 24 hour supervision/assistance, OT-  Home health OT, SLP-Home Health SLP, Outpatient SLP Projected Equipment Needs: PT-To be determined, OT- None recommended by OT, SLP-None recommended by SLP Equipment Details: PT-pt already has manual w/c and RW, OT-  Patient/family  involved in discharge planning: PT- Patient,  OT-Patient, SLP-Patient  MD ELOS: 17-20 days. Medical Rehab Prognosis:  Good Assessment: 49 year old right-handed male with history of hypertension and tobacco abuse, CAD  status post non-STEMI 04/2018 as well as left ICA occlusion in July 2020 with 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 He presented on 03/11/20 with increasing right hemiparesis. MRI brain personally reviewed, unremarkable for acute intracranial process.  Per report, positive for progressive deep watershed ischemia and development of left side Wallerian degeneration since July of last year.  Admission chemistries with creatinine 2.41 with baseline 1.6, TSH 5.449, SARS coronavirus negative.  Neurology follow-up currently maintained on Plavix for CVA prophylaxis.  Patient with resulting functional deficits with mobility, transfers, speech, self-care.  Will set goals for Min A with PT/OT and Mod A with SLP.   Due to the current state of emergency, patients may not be receiving their 3-hours of Medicare-mandated therapy.  See Team Conference Notes for weekly updates to the plan of care

## 2020-03-16 NOTE — H&P (Signed)
Physical Medicine and Rehabilitation Admission H&P    Chief Complaint  Patient presents with  . Weakness  . Code Stroke  : HPI: Zachary Mccann is a 49 year old right-handed male with history of hypertension and tobacco abuse, CAD status post non-STEMI 04/2018 as well as left ICA occlusion in July 2020 with 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  History taken from chart review due to dysarthria. Patient lives with his girlfriend.  Two-level home bed and bath on main level with ramped entrance.  Girlfriend did assist with some ADLs.  He do have 6 children in the home and for reportedly handicapped.  He presented on 03/11/20 with increasing right hemiparesis. MRI brain personally reviewed, unremarkable for acute intracranial process.  Per report, positive for progressive deep watershed ischemia and development of left side Wallerian degeneration since July of last year.  Admission chemistries with creatinine 2.41 with baseline 1.6, TSH 5.449, SARS coronavirus negative.  Neurology follow-up currently maintained on Plavix for CVA prophylaxis.  Subcutaneous heparin for DVT prophylaxis.  Maintained on a regular diet.  Renal function stabilized with gentle IV fluids latest creatinine 1.47.  Therapy evaluations completed and patient was admitted for a comprehensive rehab program. Please see preadmission assessment from earlier today as well.   Review of Systems  Constitutional: Negative for chills and fever.  HENT: Negative for hearing loss.   Eyes: Negative for blurred vision and double vision.  Respiratory: Negative for cough and shortness of breath.   Cardiovascular: Negative for chest pain and palpitations.  Gastrointestinal: Positive for constipation. Negative for heartburn, nausea and vomiting.  Genitourinary: Negative for dysuria, flank pain  and hematuria.  Musculoskeletal: Positive for myalgias.  Skin: Negative for rash.  Neurological: Positive for speech change, focal weakness and weakness.  Psychiatric/Behavioral: The patient has insomnia.    Past Medical History:  Diagnosis Date  . CAD (coronary artery disease)    a. s/p NSTEMI in 04/2018 with DES to LCx.   [name] Hypertension   . Myocardial infarction (HCC)   . Pneumonia   . Stroke [facility])    Past Surgical History:  Procedure Laterality Date  . CORONARY STENT INTERVENTION N/A 05/05/2018   Procedure: CORONARY STENT INTERVENTION;  Surgeon: Marykay Lex, MD;  Location: [facility] INVASIVE CV LAB;  Service: Cardiovascular;  Laterality: N/A;  . LEFT HEART CATH AND CORONARY ANGIOGRAPHY N/A 05/05/2018   Procedure: LEFT HEART CATH AND CORONARY ANGIOGRAPHY;  Surgeon: Marykay Lex, MD;  Location: [facility] INVASIVE CV LAB;  Service: Cardiovascular;  Laterality: N/A;  . TEE WITHOUT CARDIOVERSION N/A 09/08/2018   Procedure: TRANSESOPHAGEAL ECHOCARDIOGRAM (TEE);  Surgeon: Laurey Morale, MD;  Location: [facility] ENDOSCOPY;  Service: Cardiovascular;  Laterality: N/A;   Family History  Problem Relation Age of Onset  . Hypertension Mother   . Stroke Mother   . Dementia Mother   . Hypertension Father   . Stroke Father   . Cancer Father   . Alcoholism Father   . Cancer Sister    Social History:  reports that he has quit smoking. His smoking use included cigarettes. He has never used smokeless tobacco. He reports previous alcohol use. He reports previous drug use. Allergies:  Allergies  Allergen Reactions  . Aspirin Swelling   Medications Prior to Admission  Medication Sig Dispense Refill  . amLODipine (NORVASC) 10 MG tablet Take 1 tablet by mouth once  daily 90 tablet 0  . atorvastatin (LIPITOR) 40 MG tablet TAKE 1 TABLET BY MOUTH ONCE DAILY AT  6:00  PM 90 tablet 0  . carvedilol (COREG) 6.25 MG tablet TAKE 1 TABLET BY MOUTH TWICE DAILY WITH MEALS 180 tablet 0  . clopidogrel (PLAVIX) 75 MG  tablet Take 1 tablet (75 mg total) by mouth daily. 90 tablet 3  . hydrALAZINE (APRESOLINE) 25 MG tablet TAKE 1 TABLET BY MOUTH THREE TIMES DAILY 90 tablet 3  . Melatonin 10 MG TABS Take 10 mg by mouth at bedtime.    . mirtazapine (REMERON) 7.5 MG tablet Take 1 tablet (7.5 mg total) by mouth at bedtime. 30 tablet 0  . acetaminophen (TYLENOL) 325 MG tablet Take 2 tablets (650 mg total) by mouth every 4 (four) hours as needed for mild pain (or temp > 37.5 C (99.5 F)). (Patient not taking: Reported on 11/26/2019)    . diclofenac sodium (VOLTAREN) 1 % GEL Apply 2 g topically 4 (four) times daily. (Patient not taking: Reported on 11/26/2019) 2 g 0  . Multiple Vitamin (MULTIVITAMIN WITH MINERALS) TABS tablet Take 1 tablet by mouth daily. (Patient not taking: Reported on 03/11/2020)  0  . senna-docusate (SENOKOT-S) 8.6-50 MG tablet Take 2 tablets by mouth at bedtime. (Patient not taking: Reported on 11/26/2019)      Drug Regimen Review Drug regimen was reviewed and remains appropriate with no significant issues identified  Home: Home Living Family/patient expects to be discharged to:: Inpatient rehab Living Arrangements: (girlfriend, Copy) Available Help at Discharge: Available 24 hours/day, Family Type of Home: House Home Access: Ramped entrance Home Layout: Two level, Able to live on main level with bedroom/bathroom Bathroom Shower/Tub: Health visitor: Standard Bathroom Accessibility: Yes Home Equipment: Environmental consultant - 2 wheels, Shower seat, Bedside commode, Wheelchair - manual  Lives With: Significant other(and Automotive engineer' 6 children with 4 having disabilities)   Functional History: Prior Function Level of Independence: Needs assistance Gait / Transfers Assistance Needed: Able to ambulate with RW PTA ADL's / Homemaking Assistance Needed: Girlfriend assists with all ADLs per pt report. Comments: not working since CVA 06/2019; was a Chartered certified accountant  Functional Status:  Mobility: Bed  Mobility Overal bed mobility: Needs Assistance Bed Mobility: Sit to Supine Rolling: Mod assist, +2 for safety/equipment Supine to sit: Mod assist, +2 for physical assistance Sit to sidelying: Mod assist, +2 for physical assistance General bed mobility comments: Mod assistance +2 to return to supine, assistance to lift B LEs back to bed and maintain hookyling for bridging hips to L side of bed. Transfers Overall transfer level: Needs assistance Equipment used: None Transfers: Lateral/Scoot Transfers Sit to Stand: Max assist, +2 physical assistance, From elevated surface  Lateral/Scoot Transfers: Max assist, +2 safety/equipment General transfer comment: x2 scoots the returned to supine and bridged hips the rest of the way into his bed. Ambulation/Gait Ambulation/Gait assistance: (Focused on pre gt weight shifting within sara stedy frame.)    ADL: ADL Overall ADL's : Needs assistance/impaired Eating/Feeding: Set up, Supervision/ safety Grooming: Minimal assistance, Sitting, Standing, Oral care, Wash/dry hands Grooming Details (indicate cue type and reason): In stedy at sink, cues to incorporate RUE Upper Body Bathing: Minimal assistance, Sitting Lower Body Bathing: Maximal assistance, +2 for physical assistance, Sitting/lateral leans, Sit to/from stand Upper Body Dressing : Minimal assistance, Moderate assistance, Sitting Upper Body Dressing Details (indicate cue type and reason): donning new gown Lower Body Dressing: +2 for physical assistance, Sit to/from stand, Sitting/lateral leans, Total assistance Lower Body Dressing  Details (indicate cue type and reason): Attempted to doff socks, but unable however can reach down to top of sock to adjust Toilet Transfer: Moderate assistance, +2 for physical assistance Toilet Transfer Details (indicate cue type and reason): Simulated with stedy bed<>chair Toileting- Clothing Manipulation and Hygiene: Total assistance, +2 for physical  assistance Functional mobility during ADLs: Maximal assistance, +2 for physical assistance, Rolling walker General ADL Comments: max A to attempt to stand with RW, in stedy mod A +2 for safety and maintaining balance due to increased R side weakness and R lean  Cognition: Cognition Overall Cognitive Status: Within Functional Limits for tasks assessed Orientation Level: Oriented X4 Cognition Arousal/Alertness: Awake/alert Behavior During Therapy: WFL for tasks assessed/performed Overall Cognitive Status: Within Functional Limits for tasks assessed General Comments: speech is more slurred and difficult to understand but he able to write on white board to communicate when speech is not understood Difficult to assess due to: Impaired communication  Physical Exam: Blood pressure 135/85, pulse 95, temperature 98.6 F (37 C), temperature source Oral, resp. rate 18, weight (!) 152.5 kg, SpO2 97 %. Physical Exam  Vitals reviewed. Constitutional: He appears well-developed.  Obese  HENT:  Head: Normocephalic and atraumatic.  Eyes: EOM are normal. Right eye exhibits no discharge. Left eye exhibits no discharge.  Respiratory: Effort normal. No respiratory distress.  GI: Soft. He exhibits no distension.  Musculoskeletal:     Cervical back: Normal range of motion and neck supple.     Comments: LE edema  Neurological: He is alert.  Dysarthria Follows commands.   Provides name and age.   He was cooperative with exam. Motor: RUE: Shoulder abduction 2/5, distal 3 -/5 RLE: HF, KE 1+/5, ADF 2-/5 LUE/LLE: 5/5 proximal to distal Sensation intact to light touch  Skin: Skin is warm and dry.  Psychiatric: His behavior is normal. His speech is slurred.    No results found for this or any previous visit (from the past 48 hour(s)). DG Knee 1-2 Views Right  Result Date: 03/15/2020 CLINICAL DATA:  Right knee pain and swelling EXAM: RIGHT KNEE - 1-2 VIEW COMPARISON:  None. FINDINGS: No fracture or  malalignment. Mild to moderate medial and patellofemoral degenerative changes. Mild lateral joint space degenerative change. Trace knee effusion. IMPRESSION: No acute osseous abnormality. Tricompartment arthritis with trace knee effusion. Electronically Signed   By: [name] Pang M.D.   On: 03/15/2020 16:48       Medical Problem List and Plan: 1.  Increased right side hemiparesis with dysarthria secondary to watershed infarct left ICA/MCA distribution  -patient may shower  -ELOS/Goals: 12-17 days/Min A  Admit to CIR 2.  Antithrombotics: -DVT/anticoagulation: Subcutaneous heparin  -antiplatelet therapy: Plavix 75 mg daily 3. Pain Management: Tylenol as needed 4. Mood: Team support  -antipsychotic agents: N/A 5. Neuropsych: This patient is capable of making decisions on his own behalf. 6. Skin/Wound Care: Routine skin checks 7. Fluids/Electrolytes/Nutrition: Routine in and outs. CMP ordered. 8.  CAD with stenting.  Continue Plavix. 9.  Hypertension.  Norvasc 10 mg daily, Coreg 6.25 mg twice daily, hydralazine 25 mg 3 times daily.    Monitor with increased mobility 10.  Hyperlipidemia.  Continue Lipitor 11.  AKI on CKD.  Creatinine baselin 1.60.    CMP ordered for tomorrow 12.  Morbid Obesity.  BMI 45.60.  Dietary follow-up. Encourage weight loss 13. Sleep disturbance  Remeron 7.5 mg nightly, melatonin 9 mg nightly  Charlton Amor, PA-C 03/16/2020  I have personally performed a face to  face diagnostic evaluation, including, but not limited to relevant history and physical exam findings, of this patient and developed relevant assessment and plan.  Additionally, I have reviewed and concur with the physician assistant's documentation above.  Delice Lesch, MD, ABPMR  The patient's status has not changed. The original post admission physician evaluation remains appropriate, and any changes from the pre-admission screening or documentation from the acute chart are noted above.   Delice Lesch, MD, ABPMR

## 2020-03-16 NOTE — Progress Notes (Signed)
Kirsteins, Victorino Sparrow, MD  Physician  Physical Medicine and Rehabilitation  Consult Note     Signed  Date of Service:  03/14/2020  5:34 AM      Related encounter: ED to Hosp-Admission (Current) from 03/11/2020 in [place] [facility]      Signed      Expand AllCollapse All   Show:Clear all [x] Manual[x] Template[] Copied  Added by: [x] Angiulli, , PA-C[x] Kirsteins, , MD  [] Hover for details          Physical Medicine and Rehabilitation Consult Reason for Consult: Right side weakness Referring Physician: [facility]     HPI: Zachary Mccann is a 49 y.o. right-handed male with history of hypertension and tobacco abuse, CAD status post non-STEMI 04/2018 as well as left ICA occlusion July 2020 with associated CVA causing right-sided hemiparesis and slurred speech maintained on Plavix and received inpatient rehab services 07/17/2019 to 08/12/2019.  Per chart review patient lives with girlfriend.  Two-level home bed and bath main level with ramped entrance.  Able to ambulate with rolling walker prior to admission.  Girlfriend assist with some ADLs.  Presented 03/11/2020 with increasing right side weakness.  CT/MRI negative for acute infarct.  Positive for progressive deep watershed ischemia and development of left side Wallerian degeneration since July of last year.  Admission chemistries with creatinine 2.41 with baseline creatinine 1.6, TSH 5.449, SARS coronavirus negative.  Neurology follow-up work-up ongoing patient currently remains on Plavix for CVA prophylaxis.  Subcutaneous heparin for DVT prophylaxis.  Maintain on a regular diet.  Therapy evaluations completed with recommendations of physical medicine rehab consult.   Patient states that he wishes to go home where his girlfriend can assist him.  She has 6 young children which he states are in school     Review of Systems  Constitutional: Negative for chills and fever.  HENT: Negative for hearing loss.   Eyes:  Negative for blurred vision and double vision.  Respiratory: Negative for cough and shortness of breath.   Cardiovascular: Negative for chest pain, palpitations and leg swelling.  Gastrointestinal: Positive for constipation. Negative for heartburn and nausea.  Genitourinary: Negative for flank pain and hematuria.  Musculoskeletal: Positive for myalgias.  Neurological: Positive for speech change and weakness.  All other systems reviewed and are negative.       Past Medical History:  Diagnosis Date  . CAD (coronary artery disease)      a. s/p NSTEMI in 04/2018 with DES to LCx.   03/13/2020 Hypertension    . Myocardial infarction (HCC)    . Pneumonia    . Stroke [facility])           Past Surgical History:  Procedure Laterality Date  . CORONARY STENT INTERVENTION N/A 05/05/2018    Procedure: CORONARY STENT INTERVENTION;  Surgeon: [name], MD;  Location: [facility] INVASIVE CV LAB;  Service: Cardiovascular;  Laterality: N/A;  . LEFT HEART CATH AND CORONARY ANGIOGRAPHY N/A 05/05/2018    Procedure: LEFT HEART CATH AND CORONARY ANGIOGRAPHY;  Surgeon: Marykay Lex, MD;  Location: [facility] INVASIVE CV LAB;  Service: Cardiovascular;  Laterality: N/A;  . TEE WITHOUT CARDIOVERSION N/A 09/08/2018    Procedure: TRANSESOPHAGEAL ECHOCARDIOGRAM (TEE);  Surgeon: Marykay Lex, MD;  Location: [facility] ENDOSCOPY;  Service: Cardiovascular;  Laterality: N/A;         Family History  Problem Relation Age of Onset  . Hypertension Mother    . Stroke Mother    . Dementia Mother    .  Hypertension Father    . Stroke Father    . Cancer Father    . Alcoholism Father    . Cancer Sister      Social History:  reports that he has quit smoking. His smoking use included cigarettes. He has never used smokeless tobacco. He reports previous alcohol use. He reports previous drug use. Allergies:      Allergies  Allergen Reactions  . Aspirin Swelling          Medications Prior to Admission  Medication Sig Dispense Refill  .  amLODipine (NORVASC) 10 MG tablet Take 1 tablet by mouth once daily 90 tablet 0  . atorvastatin (LIPITOR) 40 MG tablet TAKE 1 TABLET BY MOUTH ONCE DAILY AT  6:00  PM 90 tablet 0  . carvedilol (COREG) 6.25 MG tablet TAKE 1 TABLET BY MOUTH TWICE DAILY WITH MEALS 180 tablet 0  . clopidogrel (PLAVIX) 75 MG tablet Take 1 tablet (75 mg total) by mouth daily. 90 tablet 3  . hydrALAZINE (APRESOLINE) 25 MG tablet TAKE 1 TABLET BY MOUTH THREE TIMES DAILY 90 tablet 3  . Melatonin 10 MG TABS Take 10 mg by mouth at bedtime.      . mirtazapine (REMERON) 7.5 MG tablet Take 1 tablet (7.5 mg total) by mouth at bedtime. 30 tablet 0  . acetaminophen (TYLENOL) 325 MG tablet Take 2 tablets (650 mg total) by mouth every 4 (four) hours as needed for mild pain (or temp > 37.5 C (99.5 F)). (Patient not taking: Reported on 11/26/2019)      . diclofenac sodium (VOLTAREN) 1 % GEL Apply 2 g topically 4 (four) times daily. (Patient not taking: Reported on 11/26/2019) 2 g 0  . Multiple Vitamin (MULTIVITAMIN WITH MINERALS) TABS tablet Take 1 tablet by mouth daily. (Patient not taking: Reported on 03/11/2020)   0  . senna-docusate (SENOKOT-S) 8.6-50 MG tablet Take 2 tablets by mouth at bedtime. (Patient not taking: Reported on 11/26/2019)          Home: Home Living Family/patient expects to be discharged to:: Inpatient rehab Living Arrangements: Spouse/significant other Available Help at Discharge: Available 24 hours/day, Family Type of Home: House Home Access: Ramped entrance Home Layout: Two level, Able to live on main level with bedroom/bathroom Bathroom Shower/Tub: Health visitor: Standard Home Equipment: Environmental consultant - 2 wheels, Shower seat, Bedside commode, Wheelchair - manual  Functional History: Prior Function Level of Independence: Needs assistance Gait / Transfers Assistance Needed: Able to ambulate with RW PTA ADL's / Homemaking Assistance Needed: Girlfriend assists with all ADLs per pt  report. Functional Status:  Mobility: Bed Mobility Overal bed mobility: Needs Assistance Bed Mobility: Supine to Sit, Sit to Supine, Rolling, Sit to Sidelying Rolling: Mod assist, +2 for physical assistance Supine to sit: Mod assist, +2 for physical assistance, HOB elevated Sit to sidelying: Mod assist, +2 for physical assistance General bed mobility comments: Very good initiation of getting up by moving LEs towards teh EOB; Mod assist +2 for trunk control initialy and RLE support. Cues for technique. Able to pull lightly with RUE. Minimal activity with RLE to assist. Good scooting technique once upright and able to sit EOB unassisted. Transfers Overall transfer level: Needs assistance Equipment used: 2 person hand held assist(and use of gati belt and bed pad) Transfers: Sit to/from Stand Sit to Stand: Max assist, +2 physical assistance, From elevated surface General transfer comment: max assist +2 for sit<>stand from elevated bed, performed x2. Unable to fully stand from lowest bed  setting. Tolerated approx 30 seconds of standing while ped pad was changed. R knee buckle, requiring heavy block; R knee pain as well; started work on weight shifting laterally; fatigued quickly.   ADL: ADL Overall ADL's : Needs assistance/impaired Eating/Feeding: Set up, Supervision/ safety Grooming: Minimal assistance, Sitting Upper Body Bathing: Minimal assistance, Sitting Lower Body Bathing: Maximal assistance, +2 for physical assistance, Sitting/lateral leans, Sit to/from stand Upper Body Dressing : Minimal assistance, Moderate assistance, Sitting Upper Body Dressing Details (indicate cue type and reason): donning new gown Lower Body Dressing: Maximal assistance, +2 for physical assistance, Sit to/from stand, Sitting/lateral leans Toileting- Clothing Manipulation and Hygiene: Total assistance, +2 for physical assistance Functional mobility during ADLs: Maximal assistance, +2 for physical assistance,  Rolling walker(sit<>stand only) General ADL Comments: pt with worsened R side weakness, speech difficulties, decreased standing balance   Cognition: Cognition Overall Cognitive Status: Within Functional Limits for tasks assessed Orientation Level: Oriented X4 Cognition Arousal/Alertness: Awake/alert Behavior During Therapy: WFL for tasks assessed/performed Overall Cognitive Status: Within Functional Limits for tasks assessed General Comments: pt able to provide basic PLOF and answer simple questions, appears to be accurate historian, following commands Difficult to assess due to: Impaired communication   Blood pressure (!) 145/92, pulse 83, temperature 99.1 F (37.3 C), temperature source Oral, resp. rate 18, weight (!) 152.5 kg, SpO2 97 %. Physical Exam  Nursing note and vitals reviewed. Constitutional: He is oriented to person, place, and time. He appears well-developed and well-nourished.  HENT:  Head: Normocephalic and atraumatic.  Eyes: Pupils are equal, round, and reactive to light. Conjunctivae and EOM are normal.  Cardiovascular: Normal rate, regular rhythm and normal heart sounds.  No murmur heard. Respiratory: Effort normal and breath sounds normal. No respiratory distress. He has no wheezes.  GI: Soft. Bowel sounds are normal. He exhibits no distension. There is no abdominal tenderness.  Neurological: He is alert and oriented to person, place, and time. Gait abnormal.  Patient is alert.  Speech is a bit dysarthric.  Follows commands.  Provides his name and age.  3/5 in the right deltoid, bicep, tricep, grip Trace in the right knee extensor and ankle dorsiflexor plantar flexor 0 at the right hip flexor 5/5 in the left deltoid bicep tricep grip hip flexor knee extensor ankle dorsiflexor Sensation light touch is intact in the right upper and right lower limb  Skin: Skin is warm and dry.  Psychiatric: He has a normal mood and affect.      Lab Results Last 24 Hours        Results for orders placed or performed during the hospital encounter of 03/11/20 (from the past 24 hour(s))  T4, free     Status: None    Collection Time: 03/13/20 11:18 AM  Result Value Ref Range    Free T4 0.95 0.61 - 1.12 ng/dL  Basic metabolic panel     Status: Abnormal    Collection Time: 03/13/20 11:18 AM  Result Value Ref Range    Sodium 139 135 - 145 mmol/L    Potassium 4.0 3.5 - 5.1 mmol/L    Chloride 107 98 - 111 mmol/L    CO2 23 22 - 32 mmol/L    Glucose, Bld 101 (H) 70 - 99 mg/dL    BUN 14 6 - 20 mg/dL    Creatinine, Ser 2.63 (H) 0.61 - 1.24 mg/dL    Calcium 9.2 8.9 - 33.5 mg/dL    GFR calc non Af Amer 55 (L) >60 mL/min  GFR calc Af Amer >60 >60 mL/min    Anion gap 9 5 - 15  Magnesium     Status: None    Collection Time: 03/13/20 11:18 AM  Result Value Ref Range    Magnesium 1.9 1.7 - 2.4 mg/dL      Imaging Results (Last 48 hours)  No results found.     Assessment/Plan: Diagnosis: Watershed infarct left ICA/MCA distribution with worsening of right hemiparesis 1. Does the need for close, 24 hr/day medical supervision in concert with the patient's rehab needs make it unreasonable for this patient to be served in a less intensive setting? Yes 2. Co-Morbidities requiring supervision/potential complications: Hypertension, coronary artery disease, severe dysarthria from prior stroke 3. Due to bladder management, bowel management, safety, skin/wound care, disease management, medication administration, pain management and patient education, does the patient require 24 hr/day rehab nursing? Yes 4. Does the patient require coordinated care of a physician, rehab nurse, therapy disciplines of PT, OT, speech to address physical and functional deficits in the context of the above medical diagnosis(es)? Yes Addressing deficits in the following areas: balance, endurance, locomotion, strength, transferring, bowel/bladder control, bathing, dressing, feeding, toileting, speech and  psychosocial support 5. Can the patient actively participate in an intensive therapy program of at least 3 hrs of therapy per day at least 5 days per week? No 6. The potential for patient to make measurable gains while on inpatient rehab is good 7. Anticipated functional outcomes upon discharge from inpatient rehab are min assist  with PT, min assist with OT, min assist with SLP. 8. Estimated rehab length of stay to reach the above functional goals is: 14 to 18 days 9. Anticipated discharge destination: Home 10. Overall Rehab/Functional Prognosis: good   RECOMMENDATIONS: This patient's condition is appropriate for continued rehabilitative care in the following setting: CIR Patient has agreed to participate in recommended program. No Note that insurance prior authorization may be required for reimbursement for recommended care.   Comment: Patient refusing inpatient rehabilitation.  We discussed the recommendation to help upgrade his functional status.  We discussed that his girlfriend has 3 young children to care for her as well. Lavon Paganini Angiulli, PA-C 03/14/2020  "I have personally performed a face to face diagnostic evaluation of this patient.  Additionally, I have reviewed and concur with the physician assistant's documentation above." Charlett Blake M.D. [facility] FAAPM&R (Neuromuscular Med) Diplomate Am Board of Electrodiagnostic Med Fellow Am Board of Interventional Pain            Revision History                     Routing History

## 2020-03-16 NOTE — Progress Notes (Signed)
Marcello Fennel, MD  Physician  Physical Medicine and Rehabilitation  PMR Pre-admission     Addendum  Date of Service:  03/15/2020  4:57 PM      Related encounter: ED to Hosp-Admission (Current) from 03/11/2020 in [facility]        Show:Clear all [x] Manual[x] Template[x] Copied  Added by: [x] , RN  [] Hover for details PMR Admission Coordinator Pre-Admission Assessment   Patient: JACQUESE HACKMAN is an 49 y.o., male MRN: DOB: 07-21-1971 Height:   Weight: (!) 152.5 kg                                                                                                                                                  Insurance Information HMO:     PPO:      PCP:      IPA:      80/20:      OTHER:  PRIMARY: Medicaid of [place]      Policy#: [id number]      Subscriber: pt Benefits:  Phone #: passport one online     Name: 3/30 Eff. Date: active 03/15/2020    [facility]   Medicaid Application Date:       Case Manager:  Disability Application Date:       Case Worker:    The "Data Collection Information Summary" for patients in Inpatient Rehabilitation Facilities with attached "Privacy Act Statement-Health Care Records" was provided and verbally reviewed with: N/A   Emergency Contact Information Contact Information     Name Relation Home Work Mobile    [place] Niece     [phone]    tynell, winchell Brother [phone]   [phone]    Robbie Lis Significant other     [phone]    Greggory, Safranek     [phone]       Current Medical History  Patient Admitting Diagnosis: CVA  History of Present Illness:  Rajveer Handler is a 49 year old right-handed male with history of hypertension and tobacco abuse, CAD status post non-STEMI 04/2018 as well as left ICA occlusion in July 2020 with 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  Per chart review patient lives with his girlfriend.  Two-level home bed and bath on main level with ramped entrance.  Girlfriend did assist with some ADLs.  He do have 6 children in the home and for reportedly handicapped.  Presented 03/11/2020 with increasing right-sided weakness.  CT/MRI negative for acute infarction.  Positive for progressive deep watershed ischemia and development of left side Wallerian degeneration since July of last year.  Admission chemistries with creatinine 2.41 with baseline 1.6, TSH 5.449, SARS coronavirus negative.  Neurology follow-up currently maintained on Plavix for CVA prophylaxis.  Subcutaneous  heparin for DVT prophylaxis.  Maintained on a regular diet.  Renal function stabilized with gentle IV fluids latest creatinine 1.47.     Complete NIHSS TOTAL: 8 Glasgow Coma Scale Score: 15   Past Medical History      Past Medical History:  Diagnosis Date  . CAD (coronary artery disease)      a. s/p NSTEMI in 04/2018 with DES to LCx.   [name] Hypertension    . Myocardial infarction (HCC)    . Pneumonia    . Stroke [facility])        Family History  family history includes Alcoholism in his father; Cancer in his father and sister; Dementia in his mother; Hypertension in his father and mother; Stroke in his father and mother.   Prior Rehab/Hospitalizations:  Has the patient had prior rehab or hospitalizations prior to admission? Yes  CIR with Dr Eliane Decree team 06/2019 for 26 days. Went home at min to mod assist level overall   Has the patient had major surgery during 100 days prior to admission? No   Current Medications    Current Facility-Administered Medications:  .  acetaminophen (TYLENOL) tablet 650 mg, 650 mg, Oral, Q6H PRN, 650 mg at 03/15/20 2033 **OR** acetaminophen (TYLENOL) suppository 650 mg, 650 mg, Rectal, Q6H PRN, Sherryll Burger, Pratik D, DO .  amLODipine (NORVASC) tablet 10 mg, 10 mg, Oral, Daily, Kathlen Mody, MD,  10 mg at 03/15/20 0932 .  atorvastatin (LIPITOR) tablet 40 mg, 40 mg, Oral, Q breakfast, Sherryll Burger, Pratik D, DO, 40 mg at 03/16/20 0856 .  carvedilol (COREG) tablet 6.25 mg, 6.25 mg, Oral, BID WC, Kathlen Mody, MD, 6.25 mg at 03/16/20 0856 .  clopidogrel (PLAVIX) tablet 75 mg, 75 mg, Oral, Q breakfast, Sherryll Burger, Pratik D, DO, 75 mg at 03/16/20 0856 .  heparin injection 5,000 Units, 5,000 Units, Subcutaneous, Q8H, Shah, Pratik D, DO, 5,000 Units at 03/16/20 0545 .  hydrALAZINE (APRESOLINE) tablet 25 mg, 25 mg, Oral, TID, Kathlen Mody, MD, 25 mg at 03/15/20 2035 .  melatonin tablet 9 mg, 9 mg, Oral, QHS, Kathlen Mody, MD, 9 mg at 03/15/20 2035 .  mirtazapine (REMERON) tablet 7.5 mg, 7.5 mg, Oral, QHS, Kathlen Mody, MD, 7.5 mg at 03/15/20 2034 .  ondansetron (ZOFRAN) tablet 4 mg, 4 mg, Oral, Q6H PRN **OR** ondansetron (ZOFRAN) injection 4 mg, 4 mg, Intravenous, Q6H PRN, Sherryll Burger, Pratik D, DO .  senna-docusate (Senokot-S) tablet 2 tablet, 2 tablet, Oral, QHS, Kathlen Mody, MD, 2 tablet at 03/15/20 2033   Patients Current Diet:     Diet Order                      Diet - low sodium heart healthy           Diet Heart Room service appropriate? Yes; Fluid consistency: Thin  Diet effective now                   Precautions / Restrictions Precautions Precautions: Fall Restrictions Weight Bearing Restrictions: No    Has the patient had 2 or more falls or a fall with injury in the past year?No   Prior Activity Level Limited Community (1-2x/wk): home bound; used RW; did not drive   Prior Functional Level Prior Function Level of Independence: Needs assistance Gait / Transfers Assistance Needed: Able to ambulate with RW PTA ADL's / Homemaking Assistance Needed: Girlfriend assists with all ADLs per pt report. Comments: not working since CVA 06/2019; was a Chartered certified accountant  Self Care: Did the patient need help bathing, dressing, using the toilet or eating?  Needed some help   Indoor Mobility: Did the  patient need assistance with walking from room to room (with or without device)? Needed some help   Stairs: Did the patient need assistance with internal or external stairs (with or without device)? Needed some help   Functional Cognition: Did the patient need help planning regular tasks such as shopping or remembering to take medications? Needed some help   Home Assistive Devices / [place] Community Devices/Equipment: None Home Equipment: Walker - 2 wheels, Shower seat, Bedside commode, Wheelchair - manual   Prior Device Use: Indicate devices/aids used by the patient prior to current illness, exacerbation or injury? Walker   Current Functional Level Cognition   Overall Cognitive Status: Within Functional Limits for tasks assessed Difficult to assess due to: Impaired communication Orientation Level: Oriented X4 General Comments: speech is more slurred and difficult to understand but he able to write on white board to communicate when speech is not understood    Extremity Assessment (includes Sensation/Coordination)   Upper Extremity Assessment: RUE deficits/detail RUE Deficits / Details: 2/5 shoulder flexion, 3/5 elbow/wrist/hand. pt self reports that RUE is now weaker compared to baseline (pt with baseline R side weakness) RUE Coordination: decreased fine motor, decreased gross motor  Lower Extremity Assessment: Defer to PT evaluation RLE Deficits / Details: 3/5 ankle df and pf, 2+/5 hip flexion, trace knee flexion/extension RLE Coordination: decreased gross motor     ADLs   Overall ADL's : Needs assistance/impaired Eating/Feeding: Set up, Supervision/ safety Grooming: Minimal assistance, Sitting, Standing, Oral care, Wash/dry hands Grooming Details (indicate cue type and reason): In stedy at sink, cues to incorporate RUE Upper Body Bathing: Minimal assistance, Sitting Lower Body Bathing: Maximal assistance, +2 for physical assistance, Sitting/lateral leans, Sit to/from  stand Upper Body Dressing : Minimal assistance, Moderate assistance, Sitting Upper Body Dressing Details (indicate cue type and reason): donning new gown Lower Body Dressing: +2 for physical assistance, Sit to/from stand, Sitting/lateral leans, Total assistance Lower Body Dressing Details (indicate cue type and reason): Attempted to doff socks, but unable however can reach down to top of sock to adjust Toilet Transfer: Moderate assistance, +2 for physical assistance Toilet Transfer Details (indicate cue type and reason): Simulated with stedy bed<>chair Toileting- Clothing Manipulation and Hygiene: Total assistance, +2 for physical assistance Functional mobility during ADLs: Maximal assistance, +2 for physical assistance, Rolling walker General ADL Comments: max A to attempt to stand with RW, in stedy mod A +2 for safety and maintaining balance due to increased R side weakness and R lean     Mobility   Overal bed mobility: Needs Assistance Bed Mobility: Sit to Supine Rolling: Mod assist, +2 for safety/equipment Supine to sit: Mod assist, +2 for physical assistance Sit to sidelying: Mod assist, +2 for physical assistance General bed mobility comments: Mod assistance +2 to return to supine, assistance to lift B LEs back to bed and maintain hookyling for bridging hips to L side of bed.     Transfers   Overall transfer level: Needs assistance Equipment used: None Transfers: Lateral/Scoot Transfers Sit to Stand: Max assist, +2 physical assistance, From elevated surface  Lateral/Scoot Transfers: Max assist, +2 safety/equipment General transfer comment: x2 scoots the returned to supine and bridged hips the rest of the way into his bed.     Ambulation / Gait / Stairs / Wheelchair Mobility   Ambulation/Gait Ambulation/Gait assistance: (Focused on pre gt weight  shifting within sara stedy frame.)     Posture / Balance Balance Overall balance assessment: Needs assistance Sitting-balance support:  No upper extremity supported, Feet supported Sitting balance-Leahy Scale: Good Standing balance support: Bilateral upper extremity supported Standing balance-Leahy Scale: Poor Standing balance comment: Dependent on stedy for support     Special needs/care consideration BiPAP/CPAP n/a CPM n/a Continuous Drip IV n/a Dialysis n/a Life Vest n/a Oxygen n/a Special Bed n/a Trach Size n/a Wound Vac n/a Skin intact Bowel mgmt: incontinent Bladder mgmt:incontinent Diabetic mgmt n/a Behavioral consideration  N/a Chemo/radiation  N/a Designated visitor is Copy, girlfriend    Previous Home Environment  Living Arrangements: (girlfriend, Copy)  Lives With: Significant other(and EVettes' 6 children with 4 having disabilities) Available Help at Discharge: Available 24 hours/day, Family Type of Home: House Home Layout: Two level, Able to live on main level with bedroom/bathroom Home Access: Ramped entrance Bathroom Shower/Tub: Health visitor: Standard Bathroom Accessibility: Yes How Accessible: Accessible via walker Home Care Services: No   Discharge Living Setting Plans for Discharge Living Setting: Patient's home, Lives with (comment)(girlfriend, [name] and her 6 children with 4 having disabili) Type of Home at Discharge: House Discharge Home Layout: One level, Able to live on main level with bedroom/bathroom Discharge Home Access: Ramped entrance Discharge Bathroom Shower/Tub: Walk-in shower Discharge Bathroom Toilet: Standard Discharge Bathroom Accessibility: Yes How Accessible: Accessible via walker Does the patient have any problems obtaining your medications?: No   Social/Family/Support Systems Patient Roles: Partner Contact Information: Copy Anticipated Caregiver: Event organiser Information: see above Ability/Limitations of Caregiver: min to mod assist level Caregiver Availability: 24/7 Discharge Plan Discussed with Primary  Caregiver: Yes Is Caregiver In Agreement with Plan?: Yes Does Caregiver/Family have Issues with Lodging/Transportation while Pt is in Rehab?: No   Goals/Additional Needs Patient/Family Goal for Rehab: min PT, OT and SLP Expected length of stay: ELOS 14 to 18 days, but patient states he will rethink it at 7 days Pt/Family Agrees to Admission and willing to participate: Yes Program Orientation Provided & Reviewed with Pt/Caregiver Including Roles  & Responsibilities: Yes   Decrease burden of Care through IP rehab admission: n/a   Possible need for SNF placement upon discharge: n/a   Patient Condition: This patient's medical and functional status has changed since the consult dated 03/14/2020 in which the Rehabilitation Physician determined and documented that the patient was potentially appropriate for intensive rehabilitative care in an inpatient rehabilitation facility. Issues have been addressed and update has been discussed with Dr. Allena Katz and patient now appropriate for inpatient rehabilitation. Will admit to inpatient rehab today.    Preadmission Screen Completed By:  Clois Dupes, RN, 03/16/2020 10:42 AM ______________________________________________________________________   Discussed status with Dr. Allena Katz on 03/16/2020 at  1042 and received approval for admission today.   Admission Coordinator:  Clois Dupes, time 7322 Date 03/16/2020     Revision History

## 2020-03-16 NOTE — Progress Notes (Signed)
Inpatient Rehabilitation Admissions Coordinator  CIR bed is available for this patient today. I met with patient at bedside and he is in agreement. I will contact Dr. Xu to make the arrangements. RN CM, Kelli and SW, Liz made aware.  Barbara Boyette, RN, MSN Rehab Admissions Coordinator [phone] 03/16/2020 10:39 AM  

## 2020-03-16 NOTE — Progress Notes (Signed)
Physical Therapy Treatment Patient Details Name: Zachary Mccann MRN: [id number] DOB: 03/29/1971 Today's Date: 03/16/2020    History of Present Illness 49 y.o. male with medical history significant for left ICA occlusion in July 2020 with associated CVA causing right-sided hemiparesis and slurred speech as well as mild aphasia, CAD with prior NSTEMI in 2019, hypertension, dyslipidemia, and morbid obesity.  Admitted with worsening weakness of RLE.    PT Comments    Pt supine in bed on arrival he was soiled in urine and he required transfer OOB for pericare and repositioning to eat his lunch.  He required decreased assistance to rise into standing with use of sara stedy.  He performed initiation of movement but required mod +2 to complete.  Continue to recommend aggressive rehab in a post acute setting.     Follow Up Recommendations  CIR     Equipment Recommendations  None recommended by PT    Recommendations for Other Services       Precautions / Restrictions Precautions Precautions: Fall Restrictions Weight Bearing Restrictions: No    Mobility  Bed Mobility Overal bed mobility: Needs Assistance Bed Mobility: Supine to Sit     Supine to sit: Mod assist;+2 for physical assistance     General bed mobility comments: Mod +2, he was able to use LLE to advance RLE, use of bed pad to scoot hips and assistance to elevate trunk into sitting.  Transfers Overall transfer level: Needs assistance Equipment used: Ambulation equipment used(sara stedy) Transfers: Sit to/from Stand Sit to Stand: +2 physical assistance;From elevated surface;Mod assist         General transfer comment: Pt with improved ability to rise into standing with sara stedy, he was able to perform 3/4 of the way without assistance and required mod +2 to elevate trunk and boost to complete standing.  He continues to require moderate assistance for eccentric load.  Ambulation/Gait Ambulation/Gait assistance:  (NT)               Stairs             Wheelchair Mobility    Modified Rankin (Stroke Patients Only)       Balance Overall balance assessment: Needs assistance Sitting-balance support: No upper extremity supported;Feet supported Sitting balance-Leahy Scale: Good     Standing balance support: Bilateral upper extremity supported Standing balance-Leahy Scale: Poor                              Cognition Arousal/Alertness: Awake/alert Behavior During Therapy: WFL for tasks assessed/performed Overall Cognitive Status: Within Functional Limits for tasks assessed                                 General Comments: Speech is slurred but when needed he uses a whote board well.      Exercises      General Comments        Pertinent Vitals/Pain Pain Assessment: Faces Faces Pain Scale: Hurts little more Pain Location: right knee Pain Descriptors / Indicators: Cramping;Aching;Discomfort Pain Intervention(s): Monitored during session;Repositioned    Home Living                      Prior Function            PT Goals (current goals can now be found in the care plan section) Acute Rehab PT  Goals Patient Stated Goal: Wants to go home Potential to Achieve Goals: Good Progress towards PT goals: Progressing toward goals    Frequency    Min 4X/week      PT Plan Current plan remains appropriate    Co-evaluation              AM-PAC PT "6 Clicks" Mobility   Outcome Measure  Help needed turning from your back to your side while in a flat bed without using bedrails?: A Lot Help needed moving from lying on your back to sitting on the side of a flat bed without using bedrails?: A Lot Help needed moving to and from a bed to a chair (including a wheelchair)?: Total Help needed standing up from a chair using your arms (e.g., wheelchair or bedside chair)?: Total Help needed to walk in hospital room?: Total Help needed  climbing 3-5 steps with a railing? : Total 6 Click Score: 8    End of Session Equipment Utilized During Treatment: Gait belt Activity Tolerance: Patient tolerated treatment well Patient left: in chair;with call bell/phone within reach;with chair alarm set Nurse Communication: Mobility status;Need for lift equipment PT Visit Diagnosis: Difficulty in walking, not elsewhere classified (R26.2);Hemiplegia and hemiparesis;Pain Hemiplegia - Right/Left: Right Hemiplegia - dominant/non-dominant: Non-dominant Hemiplegia - caused by: Cerebral infarction Pain - Right/Left: Right Pain - part of body: Knee     Time: 1443-1456 PT Time Calculation (min) (ACUTE ONLY): 13 min  Charges:  $Therapeutic Activity: 8-22 mins                     Erasmo Leventhal , PTA Acute Rehabilitation Services Pager [phone] Office [phone]     Salvadore Valvano Eli Hose 03/16/2020, 5:17 PM

## 2020-03-16 NOTE — Discharge Summary (Addendum)
Discharge Summary  Zachary Mccann KPT:[id number] DOB: 05-08-1971  PCP: [name], NP  Admit date: 03/11/2020 Discharge date: 03/16/2020  Time spent: , more than 50% time spent on coordination of care.  Discharge from [facility] to [facility]  Recommendations for Outpatient Follow-up after CIR:  1. F/u with PCP within a week  for hospital discharge follow up, repeat cbc/bmp at follow up 2. 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    Discharge Condition: stable  Diet recommendation: heart healthy  Filed Weights   03/11/20 1250  Weight: (!) 152.5 kg    History of present illness: (Per admitting MD Dr. Sherryll Burger) PCP: [name], NP   Patient coming from: Home  Chief Complaint: Worsening R sided weakness  HPI: Zachary Mccann is a 49 y.o. male with medical history significant for left ICA occlusion in July 2020 with associated CVA causing right-sided hemiparesis and slurred speech as well as mild aphasia, CAD with prior NSTEMI in 2019, hypertension, dyslipidemia, and morbid obesity.  He presented to the ED via EMS after he was noticed by his fiance to be slightly more weak and slumped a little bit at 10 AM.  He was apparently ambulating with his walker at around 830 at his usual baseline level of functioning.  He was noted to have some difficulty with raising his right leg up off the bed and he is usually able to do this.  He is also noticing some pain to his right upper and lower extremity.  He denies any headache, visual changes, or further trouble with his speech.  It is difficult to  understand and gather further history from the patient as he is dysarthric.  I have confirmed with his fiance at the concern appear to be mostly with difficulty in raising up his right leg.   ED Course: Patient has stable vital signs and labs with BUN 36 and creatinine 2.41 with usual baseline 1.5-1.7.  He has been given a bolus of IV fluid.  CT of the head with no acute findings noted and teleneurology evaluation without concern for large vessel occlusion or need for TPA noted.  Teleneurology is recommending brain MRI for further evaluation to rule out CVA and maintain on IV fluid for now as it is unclear whether or not he has had another CVA on clinical examination.  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 -MRI of the brain on presentation ruled out acute CVA.   - MRI brain:  1. Negative for acute infarct. 2. Positive for progressive deep watershed ischemia and development of left side Wallerian degeneration since July of last year. Underlying chronic multifocal intracranial vessel occlusion. 3. Stable superimposed advanced chronic small vessel disease in the deep gray nuclei, brainstem. - my colleague  Dr Blake Divine Discussed with neurology who recommended therapy evaluations and no further stroke work-up. -Continue with Plavix and atorvastatin 40 mg daily. (History of aspirin allergy) -PT, OT , speech evaluations recommending CIR.  CIR accepted the patient -Outpatient follow-up with neurology recommended.   CAD s/p NSTEMI with DES to Mid-distal LCx 100% stenosis May 2019: No chest pain, dyspnea.  ICM, chronic HFrEF: EF 35-45% on  LHC angiography in May.  - Euvolemic. Continue home medications  HTN: Stable on home medication  AKI/CKD 2 -BUN 36 creatinine 2.4 on presentation -Probably prerenal from dehydration -He received gentle hydration initially, creatinine back to baseline, BUN 14 creatinine 1.47 on March 28.  Abnormal TSH -TSH on presentation was 5.4, unremarkable free T4 and T3 -Follow-up with PCP  Class III obesity, Body mass index is 45.6 kg/m.  Lifestyle modification   Procedures:  None  Consultations:  Neurology  CIR  PT/OT/speech  Discharge Exam: BP 112/66 (BP Location: Right Arm)    Pulse 82    Temp 98 F (36.7 C) (Oral)    Resp 18    Wt (!) 152.5 kg    SpO2 96%    BMI 45.60 kg/m   General: NAD, expressive aphasia, follow command Cardiovascular: RRR Respiratory: Clear to auscultation bilaterally Extremity: No edema Neuro: Expressive aphasia, right hemiparesis, follow command  Discharge Instructions You were cared for by a hospitalist during your hospital stay. If you have any questions about your discharge medications or the care you received while you were in the hospital after you are discharged, you can call the unit and asked to speak with the hospitalist on call if the hospitalist that took care of you is not available. Once you are discharged, your primary care physician will handle any further medical issues. Please note that NO REFILLS for any discharge medications will be authorized once you are discharged, as it is imperative that you return to your primary care physician (or establish a relationship with a primary care physician if you do not have one) for your aftercare needs so that they can reassess your need for medications and monitor your lab values.  Discharge Instructions    Diet - low sodium heart healthy   Complete by: As directed    Increase activity slowly   Complete by: As directed      Allergies as of 03/16/2020      Reactions   Aspirin Swelling        Medication List    STOP taking these medications   acetaminophen 325 MG tablet Commonly known as: TYLENOL     TAKE these medications   amLODipine 10 MG tablet Commonly known as: NORVASC Take 1 tablet by mouth once daily   atorvastatin 40 MG tablet Commonly known as: LIPITOR TAKE 1 TABLET BY MOUTH ONCE DAILY AT  6:00  PM   carvedilol 6.25 MG tablet Commonly known as: COREG TAKE 1 TABLET BY MOUTH TWICE DAILY WITH MEALS   clopidogrel 75 MG tablet Commonly known as: PLAVIX Take 1 tablet (75 mg total) by mouth daily.   diclofenac sodium 1 % Gel Commonly known as: VOLTAREN Apply 2 g topically 4 (four) times daily.   hydrALAZINE 25 MG tablet Commonly known as: APRESOLINE TAKE 1 TABLET BY MOUTH THREE TIMES DAILY   Melatonin 10 MG Tabs Take 10 mg by mouth at bedtime.   mirtazapine 7.5 MG tablet Commonly known as: REMERON Take 1 tablet (7.5 mg total) by mouth at bedtime.   multivitamin with minerals Tabs  tablet Take 1 tablet by mouth daily.   senna-docusate 8.6-50 MG tablet Commonly known as: Senokot-S Take 2 tablets by mouth at bedtime.      Allergies  Allergen Reactions   Aspirin Swelling      The results of significant diagnostics from this hospitalization (including imaging, microbiology, ancillary and laboratory) are listed below for reference.    Significant Diagnostic Studies: DG Knee 1-2 Views Right  Result Date: 03/15/2020 CLINICAL DATA:  Right knee pain and swelling EXAM: RIGHT KNEE - 1-2 VIEW COMPARISON:  None. FINDINGS: No fracture or malalignment. Mild to moderate medial and patellofemoral degenerative changes. Mild lateral joint space degenerative change. Trace knee effusion. IMPRESSION: No acute osseous abnormality. Tricompartment arthritis with trace knee effusion. Electronically Signed   By: Donavan Foil M.D.   On: 03/15/2020 16:48   [name]  Result Date: 03/11/2020 CLINICAL DATA:  49 year old male code stroke presentation  earlier today. Increased right side weakness. Ataxia. EXAM: MRI HEAD WITHOUT CONTRAST TECHNIQUE: Multiplanar, multiecho pulse sequences of the brain and surrounding structures were obtained without intravenous contrast. COMPARISON:  Head CT earlier today.  Brain MRI 07/11/2019. FINDINGS: Brain: No restricted diffusion or evidence of acute infarction. Progressed and now confluent central white matter T2 and FLAIR hyperintensity in the left hemisphere, with new abnormal signal tracking through the left deep gray nuclei and into the brainstem compatible with Wallerian degeneration (series 9, image 9). Stable superimposed chronic lacunar type infarcts in the right hemisphere, left cerebellum, left lower pons. No definite cortical encephalomalacia. Numerous chronic microhemorrhages in the bilateral thalami and pons are stable. There is mild new microhemorrhage in the left parietal lobe. No midline shift, mass effect, evidence of mass lesion, ventriculomegaly, extra-axial collection or acute intracranial hemorrhage. Cervicomedullary junction and pituitary are within normal limits. Vascular: Chronically abnormal flow voids of both distal vertebral arteries and the left ICA siphon. Stable major vascular flow voids since July. Skull and upper cervical spine: Stable visible cervical spine, bone marrow signal. Sinuses/Orbits: Orbits are stable and negative. Chronic left maxillary sinusitis. Other: Trace new mastoid air cell fluid on the left. Chronic thickening of the adenoid soft tissues appears stable since July. Other Visible internal auditory structures appear normal. IMPRESSION: 1. Negative for acute infarct. 2. Positive for progressive deep watershed ischemia and development of left side Wallerian degeneration since July of last year. Underlying chronic multifocal intracranial vessel occlusion. 3. Stable superimposed advanced chronic small vessel disease in the deep gray nuclei, brainstem. Electronically Signed   By: Genevie Ann M.D.   On: 03/11/2020 23:20   CT HEAD CODE STROKE WO CONTRAST  Result Date: 03/11/2020 CLINICAL DATA:  Code stroke. Ataxia, stroke suspected. Possible stroke, increased weakness at start today at 10 a.m., right-sided weakness from previous stroke. Patient reports weakness on left side. EXAM: CT HEAD WITHOUT CONTRAST TECHNIQUE: Contiguous axial images were obtained from the base of the skull through the vertex without intravenous contrast. COMPARISON:  Head CT 07/27/2019, brain MRI 07/11/2019. FINDINGS: Brain: The examination is mildly motion degraded. There is no evidence of acute intracranial hemorrhage. No acute demarcated cortical infarct is identified. No evidence of intracranial mass. No midline shift or extra-axial fluid collection. Redemonstrated extensive chronic ischemic changes within the left cerebral white matter. Redemonstrated chronic lacunar infarct within the right corona radiata/basal ganglia. Also unchanged, there are chronic lacunar infarcts within the left cerebellar hemisphere. Additional ill-defined hypoattenuation within the cerebral white matter is nonspecific, but consistent with chronic small vessel ischemic disease mild  generalized parenchymal atrophy. Vascular: No hyperdense vessel.  Atherosclerotic calcifications. Skull: No calvarial fracture or suspicious osseous lesion. Sinuses/Orbits: Mucosal thickening within the dependent left maxillary sinus. No significant mastoid effusion. These results were called by telephone at the time of interpretation on 03/11/2020 at 1:00 pm to provider [facility] , who verbally acknowledged these results. IMPRESSION: No CT evidence of acute intracranial abnormality. Redemonstrated extensive chronic ischemic changes within the left cerebral white matter. Consider brain MRI to exclude acute on chronic ischemia in this region. Redemonstrated chronic lacunar infarcts within the right corona radiata/basal ganglia and left cerebellum. Background  generalized parenchymal atrophy and chronic small vessel ischemic disease. Electronically Signed   By: Jackey Loge DO   On: 03/11/2020 13:00    Microbiology: Recent Results (from the past 240 hour(s))  SARS CORONAVIRUS 2 (TAT 6-24 HRS) Nasopharyngeal Nasopharyngeal Swab     Status: None   Collection Time: 03/11/20  3:10 PM   Specimen: Nasopharyngeal Swab  Result Value Ref Range Status   SARS Coronavirus 2 NEGATIVE NEGATIVE Final    Comment: (NOTE) SARS-CoV-2 target nucleic acids are NOT DETECTED. The SARS-CoV-2 RNA is generally detectable in upper and lower respiratory specimens during the acute phase of infection. Negative results do not preclude SARS-CoV-2 infection, do not rule out co-infections with other pathogens, and should not be used as the sole basis for treatment or other patient management decisions. Negative results must be combined with clinical observations, patient history, and epidemiological information. The expected result is Negative. Fact Sheet for Patients: HairSlick.no Fact Sheet for Healthcare Providers: quierodirigir.com This test is not yet approved or cleared by the [place] FDA and  has been authorized for detection and/or diagnosis of SARS-CoV-2 by FDA under an Emergency Use Authorization (EUA). This EUA will remain  in effect (meaning this test can be used) for the duration of the COVID-19 declaration under Section 56 4(b)(1) of the Act, 21 U.S.C. section 360bbb-3(b)(1), unless the authorization is terminated or revoked sooner. Performed at [facility], [address], [place], [place] [address]      Labs: Basic Metabolic Panel: Recent Labs  Lab 03/11/20 1335 03/12/20 1459 03/13/20 1118  NA 138 140 139  K 3.9 4.3 4.0  CL 104 107 107  CO2 23 22 23   GLUCOSE 113* 116* 101*  BUN 36* 22* 14  CREATININE 2.41* 1.60* 1.47*  CALCIUM 9.1 9.0 9.2  MG  --   --  1.9   Liver Function  Tests: Recent Labs  Lab 03/11/20 1335  AST 12*  ALT 12  ALKPHOS 113  BILITOT 0.4  PROT 8.7*  ALBUMIN 3.6   No results for input(s): LIPASE, AMYLASE in the last 168 hours. No results for input(s): AMMONIA in the last 168 hours. CBC: Recent Labs  Lab 03/11/20 1335  WBC 9.6  NEUTROABS 7.3  HGB 12.5*  HCT 39.5  MCV 96.6  PLT 295   Cardiac Enzymes: No results for input(s): CKTOTAL, CKMB, CKMBINDEX, TROPONINI in the last 168 hours. BNP: BNP (last 3 results) No results for input(s): BNP in the last 8760 hours.  ProBNP (last 3 results) No results for input(s): PROBNP in the last 8760 hours.  CBG: Recent Labs  Lab 03/11/20 1227  GLUCAP 121*       Signed:  03/13/20 MD, PhD, FACP  [facility] 03/16/2020, 11:18 AM

## 2020-03-17 ENCOUNTER — Inpatient Hospital Stay (HOSPITAL_COMMUNITY): Payer: Medicaid Other

## 2020-03-17 ENCOUNTER — Inpatient Hospital Stay (HOSPITAL_COMMUNITY): Payer: Medicaid Other | Admitting: Speech Pathology

## 2020-03-17 ENCOUNTER — Inpatient Hospital Stay (HOSPITAL_COMMUNITY): Payer: Medicaid Other | Admitting: Occupational Therapy

## 2020-03-17 DIAGNOSIS — N179 Acute kidney failure, unspecified: Secondary | ICD-10-CM

## 2020-03-17 DIAGNOSIS — I10 Essential (primary) hypertension: Secondary | ICD-10-CM

## 2020-03-17 DIAGNOSIS — G479 Sleep disorder, unspecified: Secondary | ICD-10-CM

## 2020-03-17 DIAGNOSIS — I6930 Unspecified sequelae of cerebral infarction: Secondary | ICD-10-CM

## 2020-03-17 NOTE — Evaluation (Signed)
Physical Therapy Assessment and Plan  Patient Details  Name: Zachary Mccann MRN: [id number] Date of Birth: 05-03-1971  PT Diagnosis: Difficulty walking, Hemiplegia dominant, Impaired cognition, Impaired sensation, Muscle weakness and Pain in joint Rehab Potential: Good ELOS: 17-21 days   Today's Date: 03/17/2020 PT Individual Time: 3300-7622 PT Individual Time Calculation (min): 57 min    Problem List:  Patient Active Problem List   Diagnosis Date Noted  . Acute bilat watershed infarction [facility]) 03/16/2020  . Cerebral infarction, watershed distribution, unilateral, chronic 03/16/2020  . Acute renal failure ([place])   . History of CVA in adulthood   . Dyslipidemia   . CVA (cerebral vascular accident) ([place]) 03/11/2020  . Leukocytosis   . Hemiparesis affecting dominant side as late effect of stroke ([place])   . Labile blood pressure   . Sleep disturbance   . Benign hypertensive heart and kidney disease with diastolic CHF, NYHA class II and CKD stage III ([place])   . Chronic systolic congestive heart failure ([place])   . Morbid obesity ([place])   . Uncontrolled hypertension   . Dysphagia, post-stroke   . Cardiomegaly   . Acute systolic congestive heart failure ([place])   . Left middle cerebral artery stroke ([place]) 07/17/2019  . Cerebral embolism with cerebral infarction 07/11/2019  . Acute CVA (cerebrovascular accident) ([place]) 07/11/2019  . Slurred speech 07/10/2019  . Cardiomyopathy ([place]) 11/20/2018  . History of stroke 09/06/2018  . Tobacco use 09/06/2018  . History of non-ST elevation myocardial infarction (NSTEMI) 05/05/2018  . Severe Vitamin D deficiency 04/02/2018  . Community acquired pneumonia of left lower lobe of lung 04/02/2018  . CKD (chronic kidney disease), stage III ([place]) = Secondary to Hypertension 04/02/2018  . Pneumonia 03/30/2018  . Metabolic acidosis 63/33/5456  . AKI (acute kidney injury) ([place]) 03/30/2018  . N&V (nausea and vomiting) 03/30/2018  . Essential hypertension  03/30/2018    Past Medical History:  Past Medical History:  Diagnosis Date  . CAD (coronary artery disease)    a. s/p NSTEMI in 04/2018 with DES to LCx.   [name] Hypertension   . Myocardial infarction ([place])   . Pneumonia   . Stroke [facility])    Past Surgical History:  Past Surgical History:  Procedure Laterality Date  . CORONARY STENT INTERVENTION N/A 05/05/2018   Procedure: CORONARY STENT INTERVENTION;  Surgeon: Leonie Man, MD;  Location: [place] CV LAB;  Service: Cardiovascular;  Laterality: N/A;  . LEFT HEART CATH AND CORONARY ANGIOGRAPHY N/A 05/05/2018   Procedure: LEFT HEART CATH AND CORONARY ANGIOGRAPHY;  Surgeon: Leonie Man, MD;  Location: [place] CV [facility];  Service: Cardiovascular;  Laterality: N/A;  . TEE WITHOUT CARDIOVERSION N/A 09/08/2018   Procedure: TRANSESOPHAGEAL ECHOCARDIOGRAM (TEE);  Surgeon: Larey Dresser, MD;  Location: [facility] ENDOSCOPY;  Service: Cardiovascular;  Laterality: N/A;    Assessment & Plan Clinical Impression: Patient is a 49 year old right-handed male with history of hypertension and tobacco abuse, CAD status post non-STEMI 04/2018 as well as left ICA occlusion in July 2020 with 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  History taken from chart review due to dysarthria. Patient lives with his girlfriend.  Two-level home bed and bath on main level with ramped entrance.  Girlfriend did assist with some ADLs.  He do have 6 children in the home and for reportedly handicapped.  He presented on 03/11/20 with increasing right  hemiparesis. MRI brain personally reviewed, unremarkable for acute intracranial process.  Per report, positive for progressive deep watershed ischemia and development of left side Wallerian degeneration since July of last year.  Admission chemistries with creatinine 2.41 with baseline 1.6,  TSH 5.449, SARS coronavirus negative.  Neurology follow-up currently maintained on Plavix for CVA prophylaxis.  Subcutaneous heparin for DVT prophylaxis.  Maintained on a regular diet.  Renal function stabilized with gentle IV fluids latest creatinine 1.47.  Therapy evaluations completed and patient was admitted for a comprehensive rehab program. Patient transferred to CIR on 03/16/2020 .   Patient currently requires total +2 with mobility secondary to muscle weakness and muscle joint tightness, decreased cardiorespiratoy endurance, impaired timing and sequencing, decreased coordination and decreased motor planning, decreased midline orientation and decreased attention to right, decreased problem solving and decreased memory and decreased sitting balance, decreased standing balance, decreased postural control, hemiplegia and decreased balance strategies.  Prior to hospitalization, patient was mod I for basic mobility and required assistance with ADLs with mobility and lived with Significant other in a House home.  Home access is  Ramped entrance.  Patient will benefit from skilled PT intervention to maximize safe functional mobility, minimize fall risk and decrease caregiver burden for planned discharge home with 24 hour assist.  Anticipate patient will benefit from follow up [facility] at discharge.  PT - End of Session Activity Tolerance: Decreased this session Endurance Deficit: Yes PT Assessment Rehab Potential (ACUTE/IP ONLY): Good PT Barriers to Discharge: Decreased caregiver support PT Patient demonstrates impairments in the following area(s): Balance;Endurance;Motor;Pain;Perception;Safety;Sensory;Skin Integrity PT Transfers Functional Problem(s): Bed to Chair;Bed Mobility;Car;Furniture PT Locomotion Functional Problem(s): Wheelchair Mobility;Ambulation;Stairs PT Plan PT Intensity: Minimum of 1-2 x/day ,45 to 90 minutes PT Frequency: 5 out of 7 days PT Duration Estimated Length of Stay: 17-21  days PT Treatment/Interventions: Ambulation/gait training;Balance/vestibular training;Cognitive remediation/compensation;Community reintegration;Discharge planning;Disease management/prevention;DME/adaptive equipment instruction;Functional mobility training;Functional electrical stimulation;Neuromuscular re-education;Pain management;Patient/family education;Psychosocial support;Skin care/wound management;Splinting/orthotics;Stair training;Therapeutic Activities;Therapeutic Exercise;UE/LE Strength taining/ROM;UE/LE Coordination activities;Wheelchair propulsion/positioning;Visual/perceptual remediation/compensation PT Transfers Anticipated Outcome(s): min assist PT Locomotion Anticipated Outcome(s): supervision w/c mobility; mod assist gait PT Recommendation Recommendations for Other Services: Neuropsych consult;Therapeutic Recreation consult Therapeutic Recreation Interventions: Stress management;Outing/community reintergration Follow Up Recommendations: Home health PT;24 hour supervision/assistance Patient destination: Home Equipment Recommended: To be determined Equipment Details: pt already has manual w/c and RW  Skilled Therapeutic Intervention  Evaluation completed (see details above and below) with education on PT POC and goals and individual treatment initiated with focus on transfers(using Stedy and introduction to the [employer]), bed mobility re=training, and w/c mobility. Pain in R knee increasing with weightbearing activities. RN was made aware. Required max assist for sit <> stand from w/c with Stedy and up to min assist from more elevated surface of mat table/perched surface. Noted to have decreased weightshift to the R and requires max verbal and tactile cues to extend from trunk. Limited standing tolerance due to pain in R knee. Using [employer] board required mod +2 assist for transfer with cues for hand placement, head/hips relationship, and technique. Pt difficulty with motor planning  scooting when EOM to address bed mobility. Max to total assist for bed mobility with cues needed for awareness of R hemibody. Pt difficulty with w/c propulsion due to L foot not fully reaching floor but pt also with difficulty with coordination, motor planning and sequencing and would keep L foot in the air and just use LUE on wheel. Returned back to bed end of session with +2 assist via beasy board. Increased  difficulty with placement due to not having much room for R lateral lean for proper board placement. Hand off to RN.   PT Evaluation Precautions/Restrictions Precautions Precautions: Fall Precaution Comments: R hemi Restrictions Weight Bearing Restrictions: No Pain Pain Assessment Pain Scale: 0-10 Pain Score: 4  Pain Type: Acute pain Pain Location: Knee Pain Orientation: Right Pain Descriptors / Indicators: Aching Pain Frequency: Intermittent Pain Onset: With Activity Pain Intervention(s): Medication (See eMAR) Home Living/Prior Functioning Home Living Available Help at Discharge: Available 24 hours/day;Family Type of Home: House Home Access: Ramped entrance Home Layout: Two level;Able to live on main level with bedroom/bathroom Bathroom Shower/Tub: Chiropodist: Standard Bathroom Accessibility: Yes Additional Comments: Pt reports w/c will fit in home and used combination of w/c and RW for mobility PTA  Lives With: Significant other Prior Function Level of Independence: Needs assistance with ADLs;Requires assistive device for independence;Independent with gait;Independent with transfers Bath: Moderate Toileting: Moderate Dressing: Moderate Driving: No Vocation: On disability Comments: not working since CVA 06/2019; was a Furniture conservator/restorer; reports requiring assistnace with bed mobility  Vision/Perception  Perception Perception: Impaired Inattention/Neglect: Does not attend to right side of body Praxis Praxis: Impaired Praxis Impairment Details: Motor  planning  Cognition Overall Cognitive Status: History of cognitive impairments - at baseline Arousal/Alertness: Awake/alert Memory: Impaired Memory Impairment: Decreased short term memory Decreased Short Term Memory: Verbal complex Immediate Memory Recall: Sock;Blue;Bed Memory Recall Sock: Without Cue Memory Recall Blue: Without Cue Memory Recall Bed: Without Cue Awareness: Appears intact Problem Solving: Impaired Problem Solving Impairment: Functional basic;Verbal basic Safety/Judgment: Appears intact Sensation Sensation Light Touch: Appears Intact Hot/Cold: Appears Intact Proprioception: Impaired by gross assessment(RUE/RLE) Stereognosis: Impaired by gross assessment Additional Comments: impaired on RUE/RLE Coordination Gross Motor Movements are Fluid and Coordinated: No Fine Motor Movements are Fluid and Coordinated: No Coordination and Movement Description: R hemiplegia, pt has had hemiplegia since July 2020 but pt reports it is weaker Finger Nose Finger Test: unable to reach R hand to chin or nose Motor  Motor Motor: Hemiplegia;Abnormal postural alignment and control Motor - Skilled Clinical Observations: impaired postural control seated and standing with decreased weightshift to the R  Mobility Bed Mobility Bed Mobility: Rolling Left;Supine to Sit;Sit to Supine Rolling Right: Minimal Assistance - Patient > 75% Rolling Left: Maximal Assistance - Patient 25-49% Left Sidelying to Sit: Maximal Assistance - Patient 25-49% Supine to Sit: Maximal Assistance - Patient - Patient 25-49% Sit to Supine: Total Assistance - Patient < 25% Transfers Transfers: Adult nurse via Arts development officer Transfers: 2 Press photographer (Assistive device): Biochemist, clinical) Transfer via Lift Equipment: Haematologist / Therapist, nutritional Stairs: No Architect: Yes Wheelchair Assistance: Total Assistance - Patient  <25% Wheelchair Propulsion: Left upper extremity;Left lower extremity(not proper fit to be able to successfully use LLE) Wheelchair Parts Management: Needs assistance Distance: 5'  Trunk/Postural Assessment  Cervical Assessment Cervical Assessment: Within Functional Limits Thoracic Assessment Thoracic Assessment: Exceptions to WFL(bias to the L) Lumbar Assessment Lumbar Assessment: Exceptions to WFL(posterior tilt; decreased pelvic mobility) Postural Control Postural Control: Deficits on evaluation Trunk Control: decreased in standing; bias to the L, poor midline orientation Protective Responses: delayed   Balance Balance Balance Assessed: Yes Static Sitting Balance Static Sitting - Level of Assistance: 5: Stand by assistance Dynamic Sitting Balance Dynamic Sitting - Level of Assistance: 4: Min assist Static Standing Balance Static Standing - Level of Assistance: 1: +1 Total assist(using stedy) Extremity Assessment  RUE Assessment Active Range of Motion (AROM) Comments: sh  flexion to 45, elbow flexion to 90, can open hand 50% of the way RUE Body System: Neuro Brunstrum levels for arm and hand: Arm;Hand Brunstrum level for arm: Stage II Synergy is developing Brunstrum level for hand: Stage II Synergy is developing RUE Tone RUE Tone: Moderate LUE Assessment LUE Assessment: Within Functional Limits RLE Assessment RLE Assessment: Exceptions to [facility] Passive Range of Motion (PROM) Comments: tight heel cords General Strength Comments: 2-/5 at ankle and knee flexion; unable to demonstrate active knee extension or hip flexion LLE Assessment LLE Assessment: Within Functional Limits    Refer to Care Plan for Long Term Goals  Recommendations for other services: Neuropsych and Therapeutic Recreation  Stress management and Outing/community reintegration  Discharge Criteria: Patient will be discharged from PT if patient refuses treatment 3 consecutive times without medical reason, if  treatment goals not met, if there is a change in medical status, if patient makes no progress towards goals or if patient is discharged from hospital.  The above assessment, treatment plan, treatment alternatives and goals were discussed and mutually agreed upon: by patient  Juanna Cao, PT, DPT, CBIS  03/17/2020, 2:38 PM

## 2020-03-17 NOTE — Evaluation (Signed)
Occupational Therapy Assessment and Plan  Patient Details  Name: Zachary Mccann MRN: [id number] Date of Birth: [birth year]  OT Diagnosis: abnormal posture, apraxia, cognitive deficits, hemiplegia affecting dominant side, muscular wasting and disuse atrophy and muscle weakness (generalized) Rehab Potential: Rehab Potential (ACUTE ONLY): Good ELOS: 20-21 days   Today's Date: 03/17/2020 OT Individual Time: 8563-1497 OT Individual Time Calculation (min): 80 min     Problem List:  Patient Active Problem List   Diagnosis Date Noted  . Acute bilat watershed infarction [facility]) 03/16/2020  . Cerebral infarction, watershed distribution, unilateral, chronic 03/16/2020  . Acute renal failure ([place])   . History of CVA in adulthood   . Dyslipidemia   . CVA (cerebral vascular accident) ([place]) 03/11/2020  . Leukocytosis   . Hemiparesis affecting dominant side as late effect of stroke ([place])   . Labile blood pressure   . Sleep disturbance   . Benign hypertensive heart and kidney disease with diastolic CHF, NYHA class II and CKD stage III ([place])   . Chronic systolic congestive heart failure ([place])   . Morbid obesity ([place])   . Uncontrolled hypertension   . Dysphagia, post-stroke   . Cardiomegaly   . Acute systolic congestive heart failure ([place])   . Left middle cerebral artery stroke ([place]) 07/17/2019  . Cerebral embolism with cerebral infarction 07/11/2019  . Acute CVA (cerebrovascular accident) ([place]) 07/11/2019  . Slurred speech 07/10/2019  . Cardiomyopathy ([place]) 11/20/2018  . History of stroke 09/06/2018  . Tobacco use 09/06/2018  . History of non-ST elevation myocardial infarction (NSTEMI) 05/05/2018  . Severe Vitamin D deficiency 04/02/2018  . Community acquired pneumonia of left lower lobe of lung 04/02/2018  . CKD (chronic kidney disease), stage III ([place]) = Secondary to Hypertension 04/02/2018  . Pneumonia 03/30/2018  . Metabolic acidosis 02/63/7858  . AKI (acute kidney injury) ([place])  03/30/2018  . N&V (nausea and vomiting) 03/30/2018  . Essential hypertension 03/30/2018    Past Medical History:  Past Medical History:  Diagnosis Date  . CAD (coronary artery disease)    a. s/p NSTEMI in 04/2018 with DES to LCx.   [name] Hypertension   . Myocardial infarction ([place])   . Pneumonia   . Stroke [facility])    Past Surgical History:  Past Surgical History:  Procedure Laterality Date  . CORONARY STENT INTERVENTION N/A 05/05/2018   Procedure: CORONARY STENT INTERVENTION;  Surgeon: Leonie Man, MD;  Location: [place] CV LAB;  Service: Cardiovascular;  Laterality: N/A;  . LEFT HEART CATH AND CORONARY ANGIOGRAPHY N/A 05/05/2018   Procedure: LEFT HEART CATH AND CORONARY ANGIOGRAPHY;  Surgeon: Leonie Man, MD;  Location: [place] CV LAB;  Service: Cardiovascular;  Laterality: N/A;  . TEE WITHOUT CARDIOVERSION N/A 09/08/2018   Procedure: TRANSESOPHAGEAL ECHOCARDIOGRAM (TEE);  Surgeon: Larey Dresser, MD;  Location: [facility] ENDOSCOPY;  Service: Cardiovascular;  Laterality: N/A;    Assessment & Plan Clinical Impression: Zachary Mccann is a 49 year old right-handed male with history of hypertension and tobacco abuse, CAD status post non-STEMI 04/2018 as well as left ICA occlusion in July 2020 with 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  History taken from chart review due to dysarthria. Patient lives with his girlfriend.  Two-level home bed and bath on main level with ramped entrance.  Girlfriend did assist with some ADLs.  He do have 6 children in the home and  for reportedly handicapped.  He presented on 03/11/20 with increasing right hemiparesis. MRI brain personally reviewed, unremarkable for acute intracranial process.  Per report, positive for progressive deep watershed ischemia and development of left side Wallerian degeneration  since July of last year.  Admission chemistries with creatinine 2.41 with baseline 1.6, TSH 5.449, SARS coronavirus negative.  Neurology follow-up currently maintained on Plavix for CVA prophylaxis.  Subcutaneous heparin for DVT prophylaxis.  Maintained on a regular diet.  Renal function stabilized with gentle IV fluids latest creatinine 1.47.  Therapy evaluations completed and patient was admitted for a comprehensive rehab program. Please see preadmission assessment from earlier today as well.     Patient transferred to CIR on 03/16/2020 .    Patient currently requires max with basic self-care skills secondary to muscle joint tightness, decreased cardiorespiratoy endurance, motor apraxia, decreased attention to right, decreased problem solving and decreased sitting balance, decreased standing balance, decreased postural control, hemiplegia and decreased balance strategies.  Prior to hospitalization, patient could complete se;f care with mod.  Patient will benefit from skilled intervention to increase independence with basic self-care skills prior to discharge home with care partner.  Anticipate patient will require moderate physical assistance and follow up home health.  OT - End of Session Activity Tolerance: Tolerates 10 - 20 min activity with multiple rests OT Assessment Rehab Potential (ACUTE ONLY): Good OT Patient demonstrates impairments in the following area(s): Balance;Endurance;Motor;Pain;Perception;Cognition OT Basic ADL's Functional Problem(s): Grooming;Bathing;Dressing;Toileting OT Transfers Functional Problem(s): Toilet OT Additional Impairment(s): Fuctional Use of Upper Extremity OT Plan OT Intensity: Minimum of 1-2 x/day, 45 to 90 minutes OT Frequency: 5 out of 7 days OT Duration/Estimated Length of Stay: 20-21 days OT Treatment/Interventions: Balance/vestibular training;Cognitive remediation/compensation;Discharge planning;DME/adaptive equipment instruction;Functional mobility  training;Psychosocial support;Pain management;Neuromuscular re-education;Self Care/advanced ADL retraining;Therapeutic Activities;Therapeutic Exercise;UE/LE Strength taining/ROM;Splinting/orthotics;Patient/family education;UE/LE Coordination activities OT Self Feeding Anticipated Outcome(s): no goal, pt is set up OT Basic Self-Care Anticipated Outcome(s): min A UB self care, max A LB self care OT Toileting Anticipated Outcome(s): max A OT Bathroom Transfers Anticipated Outcome(s): min A to [facility] and/or toilet OT Recommendation Patient destination: Home Follow Up Recommendations: Home health OT Equipment Recommended: None recommended by OT   Skilled Therapeutic Intervention Pt seen for initial evaluation and ADL training with a focus on functional mobility.  Obtained w/c, cushion, BSC for patient. Worked on B/d bed level as pt states this is what is girlfriend does for him at home. HEAVY max to total A of 1, pt will need +2 A.   2nd helper NT arrived to help this therapist with donning brief and pants, rolling and sidelying to sit.  Attempted to do a scoot transfer to wc but pt not lifting his hips enough for it to be safe. Used bariatric stedy and pt was able to pull to standing with just CGA.  But he was unable to fully extend upper back to be upright.  Transferred to wc to finish UB b/d.   Reviewed pt's goals, OT goals, POC. Pt in wc with chair pad alarm on and all needs met.    OT Evaluation Precautions/Restrictions  Precautions Precautions: Fall   Pain Pain Assessment Pain Score: 0-No pain(did have pain in R LE with passive movement) Home Living/Prior Functioning Home Living Available Help at Discharge: Available 24 hours/day, Family Type of Home: House Home Access: Ramped entrance Home Layout: Two level, Able to live on main level with bedroom/bathroom Bathroom Shower/Tub: Chiropodist: Standard  Lives With: Significant other Prior Function Level  of  Independence: Needs assistance with ADLs, Requires assistive device for independence Bath: Moderate Toileting: Moderate Dressing: Moderate Driving: No Vocation: On disability Comments: not working since CVA 06/2019; was a Furniture conservator/restorer ADL  max -total LB care, mod A UB care Vision Patient Visual Report: No change from baseline Vision Assessment?: No apparent visual deficits Perception  Perception: Impaired Inattention/Neglect: Does not attend to right side of body(R inattention) Praxis Praxis: Impaired Praxis Impairment Details: Motor planning Cognition Overall Cognitive Status: History of cognitive impairments - at baseline Arousal/Alertness: Awake/alert Orientation Level: Person;Place;Situation Person: Oriented Place: Oriented Situation: Oriented Year: 2021 Month: April Day of Week: Correct Memory: Impaired Memory Impairment: Decreased short term memory Decreased Short Term Memory: Verbal complex Immediate Memory Recall: Sock;Blue;Bed Memory Recall Sock: Without Cue Memory Recall Blue: Without Cue Memory Recall Bed: Without Cue Awareness: Appears intact Problem Solving: Impaired Problem Solving Impairment: Functional basic;Verbal basic Safety/Judgment: Appears intact Sensation Sensation Light Touch: Appears Intact Hot/Cold: Appears Intact Proprioception: Impaired by gross assessment Stereognosis: Impaired by gross assessment Additional Comments: impaired on RUE/RLE Coordination Gross Motor Movements are Fluid and Coordinated: No Fine Motor Movements are Fluid and Coordinated: No Coordination and Movement Description: R hemiplegia, pt has had hemiplegia since July 2020 but pt reports it is weaker Finger Nose Finger Test: unable to reach R hand to chin or nose Motor  Motor Motor: Hemiplegia;Abnormal postural alignment and control Mobility  Bed Mobility Bed Mobility: Rolling Right;Rolling Left;Left Sidelying to Sit Rolling Right: Minimal Assistance - Patient >  75% Rolling Left: Total Assistance - Patient < 25% Left Sidelying to Sit: Maximal Assistance - Patient 25-49%  Trunk/Postural Assessment  Postural Control Postural Control: Deficits on evaluation(limited ability to fully extend trunk in standing.)  Balance Static Sitting Balance Static Sitting - Level of Assistance: 5: Stand by assistance Dynamic Sitting Balance Dynamic Sitting - Level of Assistance: 4: Min assist Static Standing Balance Static Standing - Level of Assistance: Not tested (comment) Extremity/Trunk Assessment RUE Assessment Active Range of Motion (AROM) Comments: sh flexion to 45, elbow flexion to 90, can open hand 50% of the way RUE Body System: Neuro Brunstrum levels for arm and hand: Arm;Hand Brunstrum level for arm: Stage II Synergy is developing Brunstrum level for hand: Stage II Synergy is developing RUE Tone RUE Tone: Moderate LUE Assessment LUE Assessment: Within Functional Limits     Refer to Care Plan for Long Term Goals  Recommendations for other services: None    Discharge Criteria: Patient will be discharged from OT if patient refuses treatment 3 consecutive times without medical reason, if treatment goals not met, if there is a change in medical status, if patient makes no progress towards goals or if patient is discharged from hospital.  The above assessment, treatment plan, treatment alternatives and goals were discussed and mutually agreed upon: by patient  [facility] 03/17/2020, 12:59 PM

## 2020-03-17 NOTE — Progress Notes (Signed)
Inpatient Rehabilitation Medication Review by a Pharmacist  A complete drug regimen review was completed for this patient to identify any potential clinically significant medication issues.  Clinically significant medication issues were identified:  no  Pharmacist comments: no issues  Time spent performing this drug regimen review (minutes): 5 mins   Jeanee Fabre, PharmD PGY1 Ambulatory Care Resident [employer] # [phone]     

## 2020-03-17 NOTE — Progress Notes (Signed)
[facility] PHYSICAL MEDICINE & REHABILITATION PROGRESS NOTE  Subjective/Complaints: Patient seen sitting up in bed this morning, having finished breakfast.  He indicates he slept well overnight.  He indicates he is ready to begin therapies.  ROS: Denies CP, shortness of breath, nausea, vomiting, diarrhea.  Objective: Vital Signs: Blood pressure 124/79, pulse 94, temperature 97.8 F (36.6 C), resp. rate 20, weight (!) 146.5 kg, SpO2 97 %. DG Knee 1-2 Views Right  Result Date: 03/15/2020 CLINICAL DATA:  Right knee pain and swelling EXAM: RIGHT KNEE - 1-2 VIEW COMPARISON:  None. FINDINGS: No fracture or malalignment. Mild to moderate medial and patellofemoral degenerative changes. Mild lateral joint space degenerative change. Trace knee effusion. IMPRESSION: No acute osseous abnormality. Tricompartment arthritis with trace knee effusion. Electronically Signed   By: Donavan Foil M.D.   On: 03/15/2020 16:48   Recent Labs    03/16/20 1956  WBC 8.7  HGB 13.1  HCT 40.0  PLT 289   Recent Labs    03/16/20 1956  CREATININE 2.11*    Physical Exam: BP 124/79 (BP Location: Left Arm)   Pulse 94   Temp 97.8 F (36.6 C)   Resp 20   Wt (!) 146.5 kg   SpO2 97%   BMI 43.80 kg/m  Constitutional: No distress . Vital signs reviewed.  Obese. HENT: Normocephalic.  Atraumatic. Eyes: EOMI. No discharge. Cardiovascular: No JVD. Respiratory: Normal effort.  No stridor. GI: Non-distended. Skin: Warm and dry.  Intact. Psych: Normal mood.  Normal behavior. Musc: Lower extremity edema. Neurological: Alert Dysarthria Follows commands.   Provides name and age.   He was cooperative with exam. Motor: RUE: Shoulder abduction 2+/5, distal 3-/5 RLE: HF, KE 2/5, ADF 3/5 LUE/LLE: 5/5 proximal to distal  Assessment/Plan: 1. Functional deficits secondary to watershed infarct left ICA/MCA distribution which require 3+ hours per day of interdisciplinary therapy in a comprehensive inpatient rehab  setting.  Physiatrist is providing close team supervision and 24 hour management of active medical problems listed below.  Physiatrist and rehab team continue to assess barriers to discharge/monitor patient progress toward functional and medical goals  Care Tool:  Bathing  Bathing activity did not occur: Safety/medical concerns           Bathing assist       Upper Body Dressing/Undressing Upper body dressing   What is the patient wearing?: Hospital gown only    Upper body assist Assist Level: Moderate Assistance - Patient 50 - 74%    Lower Body Dressing/Undressing Lower body dressing      What is the patient wearing?: Hospital gown only     Lower body assist Assist for lower body dressing: Moderate Assistance - Patient 50 - 74%     Toileting Toileting    Toileting assist Assist for toileting: Set up assist Assistive Device Comment: Urinal   Transfers Chair/bed transfer  Transfers assist  Chair/bed transfer activity did not occur: Safety/medical concerns        Locomotion Ambulation   Ambulation assist              Walk 10 feet activity   Assist           Walk 50 feet activity   Assist           Walk 150 feet activity   Assist           Walk 10 feet on uneven surface  activity   Assist  Wheelchair     Assist               Wheelchair 50 feet with 2 turns activity    Assist            Wheelchair 150 feet activity     Assist            Medical Problem List and Plan: 1.  Increased right side hemiparesis with dysarthria secondary to watershed infarct left ICA/MCA distribution  Begin CIR evaluations 2.  Antithrombotics: -DVT/anticoagulation: Subcutaneous heparin             -antiplatelet therapy: Plavix 75 mg daily 3. Pain Management: Tylenol as needed 4. Mood: Team support             -antipsychotic agents: N/A 5. Neuropsych: This patient is capable of making decisions on  his own behalf. 6. Skin/Wound Care: Routine skin checks 7. Fluids/Electrolytes/Nutrition: Routine in and outs.  CMP ordered for tomorrow 8.  CAD with stenting.  Continue Plavix. 9.  Hypertension.  Norvasc 10 mg daily, Coreg 6.25 mg twice daily, hydralazine 25 mg 3 times daily.               Monitor with increased mobility 10.  Hyperlipidemia.  Continue Lipitor 11.  AKI on CKD.  Creatinine baselin 1.60.               Creatinine 2.11 on 3/31, labs ordered for tomorrow  Encourage fluids 12.  Morbid Obesity.  BMI 45.60.  Dietary follow-up. Encourage weight loss 13. Sleep disturbance             Remeron 7.5 mg nightly, melatonin 9 mg nightly  Stable.  LOS: 1 days A FACE TO FACE EVALUATION WAS PERFORMED  Lynlee Stratton Karis Juba 03/17/2020, 8:22 AM

## 2020-03-17 NOTE — Progress Notes (Signed)
Inpatient Rehabilitation  Patient information reviewed and entered into eRehab system by Shimika Ames M. Nery Frappier, M.A., CCC/SLP, PPS Coordinator.  Information including medical coding, functional ability and quality indicators will be reviewed and updated through discharge.    

## 2020-03-17 NOTE — Evaluation (Signed)
Speech Language Pathology Assessment and Plan  Patient Details  Name: Zachary Mccann MRN: [id number] Date of Birth: 1971-02-25  SLP Diagnosis: Dysphagia;Apraxia  Rehab Potential: Fair ELOS: 21 days    Today's Date: 03/17/2020 SLP Individual Time: 0800-0900 SLP Individual Time Calculation (min): 60 min   Problem List:  Patient Active Problem List   Diagnosis Date Noted  . Acute bilat watershed infarction [facility]) 03/16/2020  . Cerebral infarction, watershed distribution, unilateral, chronic 03/16/2020  . Acute renal failure ([place])   . History of CVA in adulthood   . Dyslipidemia   . CVA (cerebral vascular accident) ([place]) 03/11/2020  . Leukocytosis   . Hemiparesis affecting dominant side as late effect of stroke ([place])   . Labile blood pressure   . Sleep disturbance   . Benign hypertensive heart and kidney disease with diastolic CHF, NYHA class II and CKD stage III ([place])   . Chronic systolic congestive heart failure ([place])   . Morbid obesity ([place])   . Uncontrolled hypertension   . Dysphagia, post-stroke   . Cardiomegaly   . Acute systolic congestive heart failure ([place])   . Left middle cerebral artery stroke ([place]) 07/17/2019  . Cerebral embolism with cerebral infarction 07/11/2019  . Acute CVA (cerebrovascular accident) ([place]) 07/11/2019  . Slurred speech 07/10/2019  . Cardiomyopathy ([place]) 11/20/2018  . History of stroke 09/06/2018  . Tobacco use 09/06/2018  . History of non-ST elevation myocardial infarction (NSTEMI) 05/05/2018  . Severe Vitamin D deficiency 04/02/2018  . Community acquired pneumonia of left lower lobe of lung 04/02/2018  . CKD (chronic kidney disease), stage III ([place]) = Secondary to Hypertension 04/02/2018  . Pneumonia 03/30/2018  . Metabolic acidosis 33/43/5686  . AKI (acute kidney injury) ([place]) 03/30/2018  . N&V (nausea and vomiting) 03/30/2018  . Essential hypertension 03/30/2018   Past Medical History:  Past Medical History:  Diagnosis Date  . CAD  (coronary artery disease)    a. s/p NSTEMI in 04/2018 with DES to LCx.   [name] Hypertension   . Myocardial infarction ([place])   . Pneumonia   . Stroke [facility])    Past Surgical History:  Past Surgical History:  Procedure Laterality Date  . CORONARY STENT INTERVENTION N/A 05/05/2018   Procedure: CORONARY STENT INTERVENTION;  Surgeon: Leonie Man, MD;  Location: [place] CV LAB;  Service: Cardiovascular;  Laterality: N/A;  . LEFT HEART CATH AND CORONARY ANGIOGRAPHY N/A 05/05/2018   Procedure: LEFT HEART CATH AND CORONARY ANGIOGRAPHY;  Surgeon: Leonie Man, MD;  Location: [place] CV LAB;  Service: Cardiovascular;  Laterality: N/A;  . TEE WITHOUT CARDIOVERSION N/A 09/08/2018   Procedure: TRANSESOPHAGEAL ECHOCARDIOGRAM (TEE);  Surgeon: Larey Dresser, MD;  Location: [facility] ENDOSCOPY;  Service: Cardiovascular;  Laterality: N/A;    Assessment / Plan / Recommendation Clinical Impression Mel Langan is a 49 year old right-handed male with history of hypertension and tobacco abuse, CAD status post non-STEMI 04/2018 as well as left ICA occlusion in July 2020 with 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  History taken from chart review due to dysarthria. Patient lives with his girlfriend.  Two-level home bed and bath on main level with ramped entrance.  Girlfriend did assist with some ADLs.  He do have 6 children in the home and for reportedly handicapped.  He presented on 03/11/20 with increasing right hemiparesis. MRI brain personally reviewed, unremarkable for acute  intracranial process.  Per report, positive for progressive deep watershed ischemia and development of left side Wallerian degeneration since July of last year.  Admission chemistries with creatinine 2.41 with baseline 1.6, TSH 5.449, SARS coronavirus negative.  Neurology follow-up  currently maintained on Plavix for CVA prophylaxis.  Subcutaneous heparin for DVT prophylaxis.  Maintained on a regular diet.  Renal function stabilized with gentle IV fluids latest creatinine 1.47.  Therapy evaluations completed and patient was admitted for a comprehensive rehab program. Please see preadmission assessment from earlier today as well.   Cognitive linguistic evaluation completed on 03/17/20. Pt is known to this Probation officer and he has made remarkable progress over previous admission in the area of speech intelligibility. Currently his cognition, receptive language and expressive language are within functional limits as well as his ability to read. Pt utilizes gestures for yes/no questions and some basic words such as yes/no are intelligible.  Pt presents with severe deficits in speech intelligibility d/t apraxia and dysarthria. Pt has imprecise articulation especially when producing frontal phonemes and dipthongs. Additionally, he producing many unvoiced sounds as voiced (such a he voicing for /p/ therefore it sounds like /b/. His apraxia impacts his oral motor ability to perform basic tasks such as blowing out a candle and it also manifests itself with phoneme repetitions especialy with initial phonemes. As a result, pt's speech intelligibility is < 25% at the word level. Skilled St is required to targete these deficits to advance pt's speech intelligibilty, increase functional independence and reduce caregiver burden.    Skilled Therapeutic Interventions          [phone] - SLP facilitated therapeutic interventions by providing tactile cues at writer's neck for voicing and then attempting to decrease voicing by using vibrations at his neck. Despite Total effort, pt's apraxia interfered as he was not able to break repetitive pattern. In addition, SLP provided external items such a bubbles and tissue paper to facilitate sustained phonation and attempts to use easy onset to produce basic sounds. Pt was  not able to blow d/t repetively producing the phoneme /b/.   SLP Assessment  Patient will need skilled [facility] during CIR admission    Recommendations  Patient destination: Home Follow up Recommendations: Home Health SLP;Outpatient SLP Equipment Recommended: None recommended by SLP    SLP Frequency 3 to 5 out of 7 days   SLP Duration  SLP Intensity  SLP Treatment/Interventions 21 days  Minumum of 1-2 x/day, 30 to 90 minutes  Speech/Language facilitation;Patient/family education    Pain Pain Assessment Pain Scale: 0-10 Pain Score: 2  Pain Type: Acute pain Pain Location: Knee Pain Orientation: Right Pain Descriptors / Indicators: Aching Pain Frequency: Intermittent Pain Onset: With Activity Pain Intervention(s): Medication (See eMAR)  Prior Functioning Cognitive/Linguistic Baseline: Within functional limits Type of Home: House  Lives With: Significant other Available Help at Discharge: Available 24 hours/day;Family Vocation: On disability  SLP Evaluation Cognition Overall Cognitive Status: Within Functional Limits for tasks assessed Arousal/Alertness: Awake/alert Memory: Impaired Memory Impairment: Decreased short term memory Decreased Short Term Memory: Verbal complex Immediate Memory Recall: Sock;Blue;Bed Memory Recall Sock: Without Cue Memory Recall Blue: Without Cue Memory Recall Bed: Without Cue Awareness: Appears intact Problem Solving: Impaired Problem Solving Impairment: Functional basic;Verbal basic Safety/Judgment: Appears intact  Comprehension Auditory Comprehension Overall Auditory Comprehension: Appears within functional limits for tasks assessed Yes/No Questions: Within Functional Limits Commands: Within Functional Limits Visual Recognition/Discrimination Discrimination: Within Function Limits Reading Comprehension Reading Status: Within funtional limits Expression Expression Primary  Mode of Expression:  Verbal Verbal Expression Overall Verbal Expression: Impaired Automatic Speech: Name Level of Generative/Spontaneous Verbalization: Phrase Repetition: Impaired Level of Impairment: Word level Naming: No impairment Non-Verbal Means of Communication: Not applicable Written Expression Dominant Hand: Left Written Expression: Not tested Oral Motor Oral Motor/Sensory Function Overall Oral Motor/Sensory Function: Moderate impairment Facial ROM: Reduced right Facial Symmetry: Abnormal symmetry right Facial Strength: Reduced right Facial Sensation: Reduced right Motor Speech Overall Motor Speech: Impaired Phonation: Normal Resonance: Within functional limits Articulation: Impaired Level of Impairment: Word Intelligibility: Intelligibility reduced Word: 0-24% accurate Phrase: 0-24% accurate Sentence: 0-24% accurate Conversation: Not tested Motor Planning: Impaired Level of Impairment: Word Motor Speech Errors: Aware Effective Techniques: Over-articulate    Short Term Goals: Week 1: SLP Short Term Goal 1 (Week 1): With Total A pt will correctly voice appropriate phonemes and their cognate pair. SLP Short Term Goal 2 (Week 1): with Max A cues, pt will utilize speech intelligibility strategies to produce basic consonant vowel combinations in 5 out of 10 opportunities.  Refer to Care Plan for Long Term Goals  Recommendations for other services: None   Discharge Criteria: Patient will be discharged from SLP if patient refuses treatment 3 consecutive times without medical reason, if treatment goals not met, if there is a change in medical status, if patient makes no progress towards goals or if patient is discharged from hospital.  The above assessment, treatment plan, treatment alternatives and goals were discussed and mutually agreed upon: by patient  Jenilee Franey Rutherford Nail 03/17/2020, 4:39 PM

## 2020-03-18 ENCOUNTER — Inpatient Hospital Stay (HOSPITAL_COMMUNITY): Payer: Medicaid Other | Admitting: Occupational Therapy

## 2020-03-18 ENCOUNTER — Inpatient Hospital Stay (HOSPITAL_COMMUNITY): Payer: Medicaid Other | Admitting: Physical Therapy

## 2020-03-18 ENCOUNTER — Inpatient Hospital Stay (HOSPITAL_COMMUNITY): Payer: Medicaid Other | Admitting: Speech Pathology

## 2020-03-18 DIAGNOSIS — I10 Essential (primary) hypertension: Secondary | ICD-10-CM

## 2020-03-18 DIAGNOSIS — N183 Chronic kidney disease, stage 3 unspecified: Secondary | ICD-10-CM

## 2020-03-18 LAB — COMPREHENSIVE METABOLIC PANEL
ALT: 14 U/L (ref 0–44)
AST: 13 U/L — ABNORMAL LOW (ref 15–41)
Albumin: 3.3 g/dL — ABNORMAL LOW (ref 3.5–5.0)
Alkaline Phosphatase: 87 U/L (ref 38–126)
Anion gap: 12 (ref 5–15)
BUN: 27 mg/dL — ABNORMAL HIGH (ref 6–20)
CO2: 22 mmol/L (ref 22–32)
Calcium: 9.5 mg/dL (ref 8.9–10.3)
Chloride: 107 mmol/L (ref 98–111)
Creatinine, Ser: 1.67 mg/dL — ABNORMAL HIGH (ref 0.61–1.24)
GFR calc Af Amer: 55 mL/min — ABNORMAL LOW (ref >60)
GFR calc non Af Amer: 47 mL/min — ABNORMAL LOW (ref >60)
Glucose, Bld: 99 mg/dL (ref 70–99)
Potassium: 4.2 mmol/L (ref 3.5–5.1)
Sodium: 141 mmol/L (ref 135–145)
Total Bilirubin: 0.6 mg/dL (ref 0.3–1.2)
Total Protein: 8 g/dL (ref 6.5–8.1)

## 2020-03-18 NOTE — Progress Notes (Signed)
Occupational Therapy Session Note  Patient Details  Name: Zachary Mccann MRN: [id number] Date of Birth: 1971/08/31  Today's Date: 03/18/2020 OT Individual Time: 1130-1208 OT Individual Time Calculation (min): 38 min    Short Term Goals: Week 1:  OT Short Term Goal 1 (Week 1): Pt will be able to push from L sidelying to sit with mod A to prep for transfer. OT Short Term Goal 2 (Week 1): Pt will be able to complete squat pivot to [facility] with max A of 1. OT Short Term Goal 3 (Week 1): Pt will be able to stand fully upright in stedy to allow caregivers to manage clothing over hips pre/post toileting. OT Short Term Goal 4 (Week 1): Pt will don shirt with min A. OT Short Term Goal 5 (Week 1): Pt will perform self ROM with mod A.  Skilled Therapeutic Interventions/Progress Updates:    Pt seen this session to focus on sit to stand skills and RUE NMR.  With +2 A for safety, pt worked on sit to stand from w/c using L hand on hallway rail. Pt was able to rise to stand with only min A and maintained static balance for 2 minutes with min A 3x. Attempted to have pt to a weight shift side to side but he was having great difficulty transferring more weight onto his RLE.  In between stands, RUE NMR with a/arom of push pull exercises and PNF D1 and D2 patterns, and sh abd to 6.  Pt then needed to use urinal.  +2 A to manage clothing over hips.  Pt sat with urinal for a few minutes but could not void.  RN will check back on him.  Pt resting in wc with belt alarm on and all needs met.   Therapy Documentation Precautions:  Precautions Precautions: Fall Precaution Comments: R hemi Restrictions Weight Bearing Restrictions: No   Pain: Pain Assessment Pain Scale: 0-10 Pain Score: 0-No pain  Therapy/Group: Individual Therapy  Nyasha Rahilly 03/18/2020, 12:44 PM

## 2020-03-18 NOTE — Care Management (Signed)
Inpatient Rehabilitation Center Individual Statement of Services  Patient Name:  Zachary Mccann  Date:  03/18/2020  Welcome to the Inpatient Rehabilitation Center.  Our goal is to provide you with an individualized program based on your diagnosis and situation, designed to meet your specific needs.  With this comprehensive rehabilitation program, you will be expected to participate in at least 3 hours of rehabilitation therapies Monday-Friday, with modified therapy programming on the weekends.  Your rehabilitation program will include the following services:  Physical Therapy (PT), Occupational Therapy (OT), Speech Therapy (ST), 24 hour per day rehabilitation nursing, Therapeutic Recreaction (TR), Neuropsychology, Case Management (Social Worker), Rehabilitation Medicine, Nutrition Services, Pharmacy Services and Other  Weekly team conferences will be held on Wednesdays to discuss your progress.  Your Social Worker will talk with you frequently to get your input and to update you on team discussions.  Team conferences with you and your family in attendance may also be held.  Expected length of stay: 12-21 DAYS  Overall anticipated outcome: Supervision/Assistance   Depending on your progress and recovery, your program may change. Your Social Worker will coordinate services and will keep you informed of any changes. Your Social Worker's name and contact numbers are listed  below.  The following services may also be recommended but are not provided by the Inpatient Rehabilitation Center:    Home Health Rehabiltiation Services  Outpatient Rehabilitation Services    Arrangements will be made to provide these services after discharge if needed.  Arrangements include referral to agencies that provide these services.  Your insurance has been verified to be:  Medicaid [place] Your primary doctor is:  Dina Rich and Jiles Prows  Pertinent information will be shared with your doctor and your  insurance company.  Social Worker:  Lavera Guise, [place] [phone] or [phone]   Information discussed with and copy given to patient by: Andria Rhein, 03/18/2020, 11:49 AM

## 2020-03-18 NOTE — Progress Notes (Signed)
Occupational Therapy Session Note  Patient Details  Name: Zachary Mccann MRN: [id number] Date of Birth: 1971/06/29  Today's Date: 03/18/2020 OT Individual Time: 0800-0901 OT Individual Time Calculation (min): 61 min    Short Term Goals: Week 1:  OT Short Term Goal 1 (Week 1): Pt will be able to push from L sidelying to sit with mod A to prep for transfer. OT Short Term Goal 2 (Week 1): Pt will be able to complete squat pivot to [facility] with max A of 1. OT Short Term Goal 3 (Week 1): Pt will be able to stand fully upright in stedy to allow caregivers to manage clothing over hips pre/post toileting. OT Short Term Goal 4 (Week 1): Pt will don shirt with min A. OT Short Term Goal 5 (Week 1): Pt will perform self ROM with mod A.  Skilled Therapeutic Interventions/Progress Updates:    Pt completed bathing and dressing sit to stand from the EOB to start session.  He was able to complete bathing with overall min assist for UB and supervision for LB with no standing as pt reported nursing assisted with washing front and back peri area prior to session.  He needed max assist with max demonstrational cueing for sequencing UB dressing.  Total assist +2 (pt 40%) for sit to stand during LB dressing.  He needed assist with threading his underpants and pants over his right foot, but he was able to donn them over the left.  He completed scoot pivot transfer with total assist +2 (pt 30%) to the wheelchair and then was positioned at the sink for completion or oral hygiene.  He was able to complete this with setup help only.  Finished session with pt in the wheelchair with call button and phone in reach with safety belt in place.    Therapy Documentation Precautions:  Precautions Precautions: Fall Precaution Comments: R hemi Restrictions Weight Bearing Restrictions: No   Pain: Pain Assessment Pain Scale: 0-10 Pain Score: 0-No pain Faces Pain Scale: Hurts a little bit Pain Type: Acute pain Pain Location:  Knee Pain Orientation: Right Pain Descriptors / Indicators: Aching Pain Onset: With Activity Pain Intervention(s): Repositioned;Emotional support ADL: See Care Tool Section for some details of mobility and selfcare  Therapy/Group: Individual Therapy  Temitope Griffing OTR/L 03/18/2020, 12:18 PM

## 2020-03-18 NOTE — Progress Notes (Signed)
Speech Language Pathology Daily Session Note  Patient Details  Name: ABDULLA POOLEY MRN: [id number] Date of Birth: Nov 17, 1971  Today's Date: 03/18/2020 SLP Individual Time: 0802-2336 SLP Individual Time Calculation (min): 55 min  Short Term Goals: Week 1: SLP Short Term Goal 1 (Week 1): With Total A pt will correctly voice appropriate phonemes and their cognate pair. SLP Short Term Goal 2 (Week 1): with Max A cues, pt will utilize speech intelligibility strategies to produce basic consonant vowel combinations in 5 out of 10 opportunities.  Skilled Therapeutic Interventions: Skilled treatment session focused on speech goals. SLP facilitated session by providing Mod A visual, verbal and contextual cues for patient to exhale during a structured speech task without voicing. Patient can alternate between a voiced phoneme and exhaling (blowing) without voicing X 5 with Mod multimodal cues. However, patient unable to translate that to producing a voiceless /p/ bur patient could produce the voiceless phonemes /s/ and/sh/ with Min verbal cues in isolation and at the word level appropriately without voicing. Patient unable to whisper at the word level despite Max A multimodal cues. Patient left upright in the wheelchair with alarm on and all needs within reach. Continue with current plan of care.       Pain Pain Assessment Pain Scale: 0-10 Pain Score: 0-No pain  Therapy/Group: Individual Therapy  Kalise Fickett 03/18/2020, 2:04 PM

## 2020-03-18 NOTE — Progress Notes (Signed)
Social Work Assessment and Plan   Patient Details  Name: Zachary Mccann MRN: [id number] Date of Birth: 25-Mar-1971  Today's Date: 03/18/2020  Problem List:  Patient Active Problem List   Diagnosis Date Noted  . Benign essential HTN   . Acute bilat watershed infarction [facility]) 03/16/2020  . Cerebral infarction, watershed distribution, unilateral, chronic 03/16/2020  . Acute renal failure ([place])   . History of CVA in adulthood   . Dyslipidemia   . CVA (cerebral vascular accident) ([place]) 03/11/2020  . Leukocytosis   . Hemiparesis affecting dominant side as late effect of stroke ([place])   . Labile blood pressure   . Sleep disturbance   . Benign hypertensive heart and kidney disease with diastolic CHF, NYHA class II and CKD stage III ([place])   . Chronic systolic congestive heart failure ([place])   . Morbid obesity ([place])   . Uncontrolled hypertension   . Dysphagia, post-stroke   . Cardiomegaly   . Acute systolic congestive heart failure ([place])   . Left middle cerebral artery stroke ([place]) 07/17/2019  . Cerebral embolism with cerebral infarction 07/11/2019  . Acute CVA (cerebrovascular accident) ([place]) 07/11/2019  . Slurred speech 07/10/2019  . Cardiomyopathy ([place]) 11/20/2018  . History of stroke 09/06/2018  . Tobacco use 09/06/2018  . History of non-ST elevation myocardial infarction (NSTEMI) 05/05/2018  . Severe Vitamin D deficiency 04/02/2018  . Community acquired pneumonia of left lower lobe of lung 04/02/2018  . CKD (chronic kidney disease), stage III ([place]) = Secondary to Hypertension 04/02/2018  . Pneumonia 03/30/2018  . Metabolic acidosis 12/19/7251  . AKI (acute kidney injury) ([place]) 03/30/2018  . N&V (nausea and vomiting) 03/30/2018  . Essential hypertension 03/30/2018   Past Medical History:  Past Medical History:  Diagnosis Date  . CAD (coronary artery disease)    a. s/p NSTEMI in 04/2018 with DES to LCx.   [name] Hypertension   . Myocardial infarction ([place])   . Pneumonia   .  Stroke [facility])    Past Surgical History:  Past Surgical History:  Procedure Laterality Date  . CORONARY STENT INTERVENTION N/A 05/05/2018   Procedure: CORONARY STENT INTERVENTION;  Surgeon: Leonie Man, MD;  Location: [place] CV LAB;  Service: Cardiovascular;  Laterality: N/A;  . LEFT HEART CATH AND CORONARY ANGIOGRAPHY N/A 05/05/2018   Procedure: LEFT HEART CATH AND CORONARY ANGIOGRAPHY;  Surgeon: Leonie Man, MD;  Location: [place] CV LAB;  Service: Cardiovascular;  Laterality: N/A;  . TEE WITHOUT CARDIOVERSION N/A 09/08/2018   Procedure: TRANSESOPHAGEAL ECHOCARDIOGRAM (TEE);  Surgeon: Larey Dresser, MD;  Location: [facility] ENDOSCOPY;  Service: Cardiovascular;  Laterality: N/A;   Social History:  reports that he has quit smoking. His smoking use included cigarettes. He has never used smokeless tobacco. He reports previous alcohol use. He reports previous drug use.  Family / Support Systems Patient Roles: Partner Spouse/Significant Other: Yvette Children: 6 Children (4-17) Other Supports: Family: Sister, Mom, [place], Brother Anticipated Caregiver: Yvette Ability/Limitations of Caregiver: No Caregiver Availability: 24/7 Family Dynamics: N/A  Social History Preferred language: English Religion:  Cultural Background: Was working before previous stroke. Last stroke left pt in wheelchair Read: Yes Write: Yes Employment Status: Disabled Public relations account executive Issues: No   Abuse/Neglect Abuse/Neglect Assessment Can Be Completed: Yes Physical Abuse: Denies Verbal Abuse: Denies Sexual Abuse: Denies Exploitation of patient/patient's resources: Denies Self-Neglect: Denies  Emotional Status Pt's affect, behavior and adjustment status: Patient feels like there has been an adjustment in his mood Stated he feels  like his moods are "so,so and declining" Recent Psychosocial Issues: N/A Psychiatric History: N/A Substance Abuse History: No  Patient / Family Perceptions,  Expectations & Goals Pt/Family understanding of illness & functional limitations: Yes. Sig other reported they have been taking care of him since his health decline Premorbid pt/family roles/activities: N/A Anticipated changes in roles/activities/participation: N/A Pt/family expectations/goals: Pt and family expect pt to discharge home  [employer]: None Premorbid Home Care/DME Agencies: None([facility], [facility]) Transportation available at discharge: Yes, pt will have transport avaliable at d/c Resource referrals recommended: Neuropsychology  Discharge Planning Living Arrangements: Spouse/significant other, Children, Other relatives Support Systems: Spouse/significant other, Children, Parent, Other relatives Type of Residence: Private residence Insurance Resources: Medicaid (specify county) Surveyor, quantity Resources: [employer] Financial Screen Referred: Yes Living Expenses: Psychologist, sport and exercise Management: Patient, Significant Other Does the patient have any problems obtaining your medications?: No Social Work Anticipated Follow Up Needs: HH/OP Expected length of stay: 14-18 Days  Clinical Impression SW went to meet patient. Patient was pleasant, seemed down. Mood improved by end of conversation. Called significant other and sister in room with pt.  Introduced self, explained role and discharge process. Family pleased as well. Pt is not a Cytogeneticist.   Andria Rhein 03/18/2020, 11:37 AM

## 2020-03-18 NOTE — Progress Notes (Signed)
[place] PHYSICAL MEDICINE & REHABILITATION PROGRESS NOTE  Subjective/Complaints: Patient seen laying in bed this morning.  He indicates he slept well overnight.  He indicates that he had good first day of therapies yesterday.  Discussed with therapies patient's previous admission.  ROS: Denies CP, shortness of breath, nausea, vomiting, diarrhea.  Objective: Vital Signs: Blood pressure 112/86, pulse 83, temperature 98 F (36.7 C), resp. rate 18, weight (!) 144.2 kg, SpO2 98 %. No results found. Recent Labs    03/16/20 1956  WBC 8.7  HGB 13.1  HCT 40.0  PLT 289   Recent Labs    03/16/20 1956 03/18/20 0553  NA  --  141  K  --  4.2  CL  --  107  CO2  --  22  GLUCOSE  --  99  BUN  --  27*  CREATININE 2.11* 1.67*  CALCIUM  --  9.5    Physical Exam: BP 112/86 (BP Location: Left Arm)   Pulse 83   Temp 98 F (36.7 C)   Resp 18   Wt (!) 144.2 kg   SpO2 98%   BMI 43.12 kg/m  Constitutional: No distress . Vital signs reviewed.  Obese. HENT: Normocephalic.  Atraumatic. Eyes: EOMI. No discharge. Cardiovascular: No JVD. Respiratory: Normal effort.  No stridor. GI: Non-distended. Skin: Warm and dry.  Intact. Psych: Normal mood.  Normal behavior. Musc: Lower extremity edema Neurological: Alert Dysarthria Follows commands.   Provides name and age.   He was cooperative with exam. Motor: RUE: Shoulder abduction 2+/5, distal 3-/5, some improvement RLE: HF, KE 2/5, ADF 3/5, some improvement  Assessment/Plan: 1. Functional deficits secondary to watershed infarct left ICA/MCA distribution which require 3+ hours per day of interdisciplinary therapy in a comprehensive inpatient rehab setting.  Physiatrist is providing close team supervision and 24 hour management of active medical problems listed below.  Physiatrist and rehab team continue to assess barriers to discharge/monitor patient progress toward functional and medical goals  Care Tool:  Bathing  Bathing  activity did not occur: Safety/medical concerns Body parts bathed by patient: Chest, Abdomen, Front perineal area, Left upper leg, Left lower leg, Face   Body parts bathed by helper: Left lower leg, Right lower leg, Front perineal area, Buttocks     Bathing assist Assist Level: Maximal Assistance - Patient 24 - 49%     Upper Body Dressing/Undressing Upper body dressing   What is the patient wearing?: Pull over shirt    Upper body assist Assist Level: Maximal Assistance - Patient 25 - 49%    Lower Body Dressing/Undressing Lower body dressing      What is the patient wearing?: Pants, Incontinence brief     Lower body assist Assist for lower body dressing: Dependent - Patient 0%     Toileting Toileting    Toileting assist Assist for toileting: Maximal Assistance - Patient 25 - 49% Assistive Device Comment: Urinal   Transfers Chair/bed transfer  Transfers assist  Chair/bed transfer activity did not occur: Safety/medical concerns  Chair/bed transfer assist level: 2 Helpers     Locomotion Ambulation   Ambulation assist   Ambulation activity did not occur: Safety/medical concerns          Walk 10 feet activity   Assist  Walk 10 feet activity did not occur: Safety/medical concerns        Walk 50 feet activity   Assist Walk 50 feet with 2 turns activity did not occur: Safety/medical concerns  Walk 150 feet activity   Assist Walk 150 feet activity did not occur: Safety/medical concerns         Walk 10 feet on uneven surface  activity   Assist Walk 10 feet on uneven surfaces activity did not occur: Safety/medical concerns         Wheelchair     Assist Will patient use wheelchair at discharge?: Yes Type of Wheelchair: Manual    Wheelchair assist level: Total Assistance - Patient < 25% Max wheelchair distance: 5'    Wheelchair 50 feet with 2 turns activity    Assist        Assist Level: Total Assistance -  Patient < 25%   Wheelchair 150 feet activity     Assist     Assist Level: Total Assistance - Patient < 25%      Medical Problem List and Plan: 1.  Increased right side hemiparesis with dysarthria secondary to watershed infarct left ICA/MCA distribution  Continue CIR 2.  Antithrombotics: -DVT/anticoagulation: Subcutaneous heparin             -antiplatelet therapy: Plavix 75 mg daily 3. Pain Management: Tylenol as needed 4. Mood: Team support             Antipsychotic agents: N/A 5. Neuropsych: This patient is capable of making decisions on his own behalf. 6. Skin/Wound Care: Routine skin checks 7. Fluids/Electrolytes/Nutrition: Routine in and outs.   8.  CAD with stenting.  Continue Plavix. 9.  Hypertension.  Norvasc 10 mg daily, Coreg 6.25 mg twice daily, hydralazine 25 mg 3 times daily.    Controlled on 4/2             Monitor with increased mobility 10.  Hyperlipidemia.  Continue Lipitor 11.  AKI on CKD.  Creatinine baselin 1.60.               Creatinine 1.67 on 4/2, labs ordered for Monday  Encourage fluids 12.  Morbid Obesity.  BMI 45.60.  Dietary follow-up. Encourage weight loss 13. Sleep disturbance             Remeron 7.5 mg nightly, melatonin 9 mg nightly  Improving  LOS: 2 days A FACE TO FACE EVALUATION WAS PERFORMED  Zachary Mccann Karis Juba 03/18/2020, 8:24 AM

## 2020-03-18 NOTE — Progress Notes (Signed)
Physical Therapy Session Note  Patient Details  Name: Zachary Mccann MRN: [id number] Date of Birth: 01/31/1971  Today's Date: 03/18/2020 PT Individual Time: 7867-6720 PT Individual Time Calculation (min): 64 min   Short Term Goals: Week 1:  PT Short Term Goal 1 (Week 1): Pt will be able to perform basic w/c level transfers with max assist PT Short Term Goal 2 (Week 1): Pt will be able to perform sit <> stand with max assist PT Short Term Goal 3 (Week 1): Pt will be able to perform w/c propulsion x 50'  Skilled Therapeutic Interventions/Progress Updates:    Pt received sitting in w/c and agreeable to therapy session.  Transported to/from gym in w/c for time management and energy conservation. Sit>stand w/c>stedy with +2 mod assist for lifting into standing - cuing for anterior trunk lean. While standing in stedy w/ B UE support on bar pt demonstrates good R knee control with no buckling and min assist for balance. Stedy transfer to/from mat. Sit<>stand to/from stedy seat using B UE support on bar with +2 min assist (2nd person for safety) and sat on mat. Sit>stands slightly elevated EOM>RW with max assist of 1 for lifting, facilitating anterior trunk lean, and guarding R knee with +2 providing CGA for safety - cues for scooting forward, pushing up with B UEs, and leaning forward to come to stand - manual facilitation for increased anterior trunk lean.  Stand>sit RW>slightly elevated EOM with min/mod assist of 1 for controlled descent and cuing to reach back with UE. In standing with B UE support on RW performed the following pre-gait activities targeting R LE NMR using mirror feedback for improved upright posture and +2 present for safety and min assist when needed: - stepping R LE forwards/backwards with pt demonstrating minimal hip/knee flexion during movement but able to take adequate step forward but min assist for bringing foot backwards - min/mod assist for balance and +2 present for safety -  stepping L LE forward/backwards (2 sets to fatigue ~8-10reps) with pt demonstrating increased anterior trunk flexion with hip flexion and decreased R lateral weight shift onto stance limb - max multimodal cuing for improvement - mod assist throughout facilitating R lateral weight shift, trunk/R hip extension, and guarding R knee (no significant buckling) - stepping L LE in/out laterally with continued cuing and manual facilitation for trunk/hip extension to improve upright posture; mod assist for balance and facilitation Pt's wife arrived and provided information regarding pt's PLOF stating he primarily was performing stand pivot transfers bed<>w/c using RW and his wife would perform dependent w/c transportation around the house. Pt's wife reports the pt did not receive follow-up physical therapy after D/C from CIR due to insurance limitations and she feels that contributed to his lack of progress with ambulation. Stedy transfer back to w/c. Transported back to room in w/c and pt left seated with needs in reach and seat belt alarm on.   Therapy Documentation Precautions:  Precautions Precautions: Fall Precaution Comments: R hemi Restrictions Weight Bearing Restrictions: No  Pain: Denies pain during session but does request heat at end of session for R knee pain management - donned moist heat pack and notified RN to remove in 20 minutes.   Therapy/Group: Individual Therapy  Ginny Forth, PT, DPT 03/18/2020, 12:33 PM

## 2020-03-19 ENCOUNTER — Inpatient Hospital Stay (HOSPITAL_COMMUNITY): Payer: Medicaid Other | Admitting: Speech Pathology

## 2020-03-19 ENCOUNTER — Other Ambulatory Visit: Payer: Self-pay

## 2020-03-19 ENCOUNTER — Inpatient Hospital Stay (HOSPITAL_COMMUNITY): Payer: Medicaid Other | Admitting: Physical Therapy

## 2020-03-19 ENCOUNTER — Encounter (HOSPITAL_COMMUNITY): Payer: Self-pay | Admitting: Physical Medicine & Rehabilitation

## 2020-03-19 ENCOUNTER — Inpatient Hospital Stay (HOSPITAL_COMMUNITY): Payer: Medicaid Other | Admitting: Occupational Therapy

## 2020-03-19 NOTE — Progress Notes (Signed)
Occupational Therapy Session Note  Patient Details  Name: Zachary Mccann MRN: [id number] Date of Birth: February 10, 1971  Today's Date: 03/19/2020 OT Individual Time: 1045-1200 OT Individual Time Calculation (min): 75 min    Short Term Goals: Week 1:  OT Short Term Goal 1 (Week 1): Pt will be able to push from L sidelying to sit with mod A to prep for transfer. OT Short Term Goal 2 (Week 1): Pt will be able to complete squat pivot to [facility] with max A of 1. OT Short Term Goal 3 (Week 1): Pt will be able to stand fully upright in stedy to allow caregivers to manage clothing over hips pre/post toileting. OT Short Term Goal 4 (Week 1): Pt will don shirt with min A. OT Short Term Goal 5 (Week 1): Pt will perform self ROM with mod A.  Skilled Therapeutic Interventions/Progress Updates:    Pt received in wc and ready for therapy. Pt taken to therapy gym to work on LE strength by performing scoot transfers on sliding board (vs just sliding his hips) to progress to a squat pivot.  A great deal of the session was spent on working on forward weight shifts to lift hips high enough for an efficient scoot.  Used board for safety as pt is not able to fully pivot his hips.    Moving to his L side, pt was able to shift forward with min-mod A and perform small scoots along the board. On mat worked on lifting hips and holding for a few counts at a time and scooting L and R on mat. It is much easier for him to move to the R because he can use strong L side to push to the side.    Pt worked on scooting to R with improved control to return to wc.  A/arom with towel slides on the floor for RLE and RUE on table.  Worked on B hands pushing and pulling bar with A/arom.   Pt participated well. Pt taken back to his room, belt alarm on and all needs met.  Therapy Documentation Precautions:  Precautions Precautions: Fall Precaution Comments: R hemi Restrictions Weight Bearing Restrictions: No    Vital Signs: Therapy  Vitals Pulse Rate: 98 BP: 113/87 Patient Position (if appropriate): Lying Pain: Pain Assessment Pain Scale: 0-10 Pain Score: 4  Pain Type: Acute pain Pain Location: Leg Pain Orientation: Right Pain Descriptors / Indicators: Aching Pain Frequency: Intermittent Pain Onset: With Activity Pain Intervention(s): RN made aware   Therapy/Group: Individual Therapy  [place] 03/19/2020, 12:13 PM

## 2020-03-19 NOTE — Progress Notes (Signed)
Speech Language Pathology Daily Session Note  Patient Details  Name: Zachary Mccann MRN: [id number] Date of Birth: 1971-05-15  Today's Date: 03/19/2020 SLP Individual Time: 8937-3428 SLP Individual Time Calculation (min): 41 min  Short Term Goals: Week 1: SLP Short Term Goal 1 (Week 1): With Total A pt will correctly voice appropriate phonemes and their cognate pair. SLP Short Term Goal 2 (Week 1): with Max A cues, pt will utilize speech intelligibility strategies to produce basic consonant vowel combinations in 5 out of 10 opportunities.  Skilled Therapeutic Interventions:  Pt was seen for skilled ST targeting goals for communication.  Upon arrival, pt was in bed with complaints of 6/10 pain in his right leg.  RN made aware and meds were administered.  Pt requesting to get out of bed and was transferred to wheelchair with +2 via Stedy lift.  SLP facilitated the session with max assist verbal cues for alternating between voiced and voiceless initial consonants in minimally distinct word pairs.  Cues for "whispering" for eliciting more consistent production of voiceless consonants was not effective on this date.  Pt was left in wheelchair with chair alarm set and call bell within reach.  Continue per current plan of care.    Pain Pain Assessment Pain Scale: 0-10 Pain Score: 6  Pain Type: Acute pain Pain Location: Leg Pain Orientation: Right Pain Descriptors / Indicators: Aching Pain Frequency: Intermittent Pain Onset: With Activity Pain Intervention(s): RN made aware  Therapy/Group: Individual Therapy  Edwena Mayorga, Melanee Spry 03/19/2020, 9:51 AM

## 2020-03-19 NOTE — Progress Notes (Signed)
Physical Therapy Session Note  Patient Details  Name: Zachary Mccann MRN: [id number] Date of Birth: 1970-12-25  Today's Date: 03/19/2020 PT Individual Time: 1305-1410 PT Individual Time Calculation (min): 65 min   Short Term Goals: Week 1:  PT Short Term Goal 1 (Week 1): Pt will be able to perform basic w/c level transfers with max assist PT Short Term Goal 2 (Week 1): Pt will be able to perform sit <> stand with max assist PT Short Term Goal 3 (Week 1): Pt will be able to perform w/c propulsion x 50'  Skilled Therapeutic Interventions/Progress Updates:    Pt received sitting in w/c and agreeable to therapy session. Reporting need to urinate - mod assist for LB clothing management and total assist for holding urinal but pt unable to void.  Transported to/from gym in w/c for time management and energy conservation. Sit>stand w/c>stedy with +2 mod assist for lifting into standing. Stedy transfer to [facility]. Performed repeated sit<>stands to/from high perched seat in stedy with B HHA to decrease compensations from pulling up on bar with +2 min assist x8reps. Sit>stand EOM>RW with max assist of 1 and min assist/CGA of 2nd person with cuing for proper UE placement and increased anterior trunk lean followed by trunk/hip extension for upright posture; therapist guarding R knee but no buckling. While standing with RW min assist for steadying and cuing for increased trunk/hip extension therapist and +2 assist donned maxisky harness (poor fit so used gait belt to improve strap around waist).  Gait training 11ft, 81ft x2 using RW while in maxisky harness for safety and +2 present for safety/intermittent AD management - therapist providing multimodal cuing for sequencing of LE stepping and AD management - pt able to bring R foot through during swing though had minimal foot clearance and only very slight hip/knee flexion - demonstrates lack of R lateral weight shift during stance phase with pt compensating via forward  trunk flexion and increased WBing down through B UEs - step-to pattern leading with R LE - manual facilitation and cuing throughout for improved upright posture via trunk/hip extension and increased R lateral weight shift during stance. Doffed harness as described above. Transported back to room in w/c. Sit>stand w/c>RW with max assist of 1 for lifting into standing and continued cuing for anterior weight shift followed by trunk/hip extension. Stood ~48minutes using RW with min assist of 1 while +2 assist performed total assist LB clothing management and peri-care due to pt being incontinent of BM. Pt left seated in w/c with needs in reach and seat belt alarm on.   Therapy Documentation Precautions:  Precautions Precautions: Fall Precaution Comments: R hemi Restrictions Weight Bearing Restrictions: No  Pain: Denies pain during session.   Therapy/Group: Individual Therapy  Ginny Forth, PT, DPT 03/19/2020, 12:27 PM

## 2020-03-19 NOTE — Progress Notes (Signed)
[place] PHYSICAL MEDICINE & REHABILITATION PROGRESS NOTE  Subjective/Complaints: No new complaints. Slept well per RN.   ROS: Limited due to cognitive/behavioral   Objective: Vital Signs: Blood pressure 113/87, pulse 98, temperature 98.1 F (36.7 C), resp. rate 18, height 6' (1.829 m), weight (!) 144.6 kg, SpO2 96 %. No results found. Recent Labs    03/16/20 1956  WBC 8.7  HGB 13.1  HCT 40.0  PLT 289   Recent Labs    03/16/20 1956 03/18/20 0553  NA  --  141  K  --  4.2  CL  --  107  CO2  --  22  GLUCOSE  --  99  BUN  --  27*  CREATININE 2.11* 1.67*  CALCIUM  --  9.5    Physical Exam: BP 113/87 (BP Location: Left Arm)   Pulse 98   Temp 98.1 F (36.7 C)   Resp 18   Ht 6' (1.829 m)   Wt (!) 144.6 kg   SpO2 96%   BMI 43.24 kg/m  Constitutional: No distress . Vital signs reviewed. obese HEENT: EOMI, oral membranes moist Neck: supple Cardiovascular: RRR without murmur. No JVD    Respiratory/Chest: CTA Bilaterally without wheezes or rales. Normal effort    GI/Abdomen: BS +, non-tender, non-distended Ext: no clubbing, cyanosis  Psych: flate Musc: Lower extremity edema Neurological: Alert Dysarthria Follows commands.   Provides name and age.   He was cooperative with exam. Motor: RUE: Shoulder abduction 2+/5, distal 3-/5--no real changes RLE: HF, KE 2/5, ADF 3/5, some improvement  Assessment/Plan: 1. Functional deficits secondary to watershed infarct left ICA/MCA distribution which require 3+ hours per day of interdisciplinary therapy in a comprehensive inpatient rehab setting.  Physiatrist is providing close team supervision and 24 hour management of active medical problems listed below.  Physiatrist and rehab team continue to assess barriers to discharge/monitor patient progress toward functional and medical goals  Care Tool:  Bathing  Bathing activity did not occur: Safety/medical concerns Body parts bathed by patient: Chest, Abdomen, Left upper  leg, Left lower leg, Face, Right arm, Right upper leg, Right lower leg   Body parts bathed by helper: Left arm Body parts n/a: Front perineal area, Buttocks(completed earlier with nursing per pt report)   Bathing assist Assist Level: 2 Helpers     Upper Body Dressing/Undressing Upper body dressing   What is the patient wearing?: Pull over shirt    Upper body assist Assist Level: Maximal Assistance - Patient 25 - 49%    Lower Body Dressing/Undressing Lower body dressing      What is the patient wearing?: Pants, Incontinence brief     Lower body assist Assist for lower body dressing: Maximal Assistance - Patient 25 - 49%     Toileting Toileting    Toileting assist Assist for toileting: Maximal Assistance - Patient 25 - 49% Assistive Device Comment: Urinal   Transfers Chair/bed transfer  Transfers assist  Chair/bed transfer activity did not occur: Safety/medical concerns  Chair/bed transfer assist level: Dependent - mechanical lift(stedy)     Locomotion Ambulation   Ambulation assist   Ambulation activity did not occur: Safety/medical concerns          Walk 10 feet activity   Assist  Walk 10 feet activity did not occur: Safety/medical concerns        Walk 50 feet activity   Assist Walk 50 feet with 2 turns activity did not occur: Safety/medical concerns  Walk 150 feet activity   Assist Walk 150 feet activity did not occur: Safety/medical concerns         Walk 10 feet on uneven surface  activity   Assist Walk 10 feet on uneven surfaces activity did not occur: Safety/medical concerns         Wheelchair     Assist Will patient use wheelchair at discharge?: Yes Type of Wheelchair: Manual    Wheelchair assist level: Total Assistance - Patient < 25% Max wheelchair distance: 5'    Wheelchair 50 feet with 2 turns activity    Assist        Assist Level: Total Assistance - Patient < 25%   Wheelchair 150 feet  activity     Assist     Assist Level: Total Assistance - Patient < 25%    BP 113/87 (BP Location: Left Arm)   Pulse 98   Temp 98.1 F (36.7 C)   Resp 18   Ht 6' (1.829 m)   Wt (!) 144.6 kg   SpO2 96%   BMI 43.24 kg/m    Medical Problem List and Plan: 1.  Increased right side hemiparesis with dysarthria secondary to watershed infarct left ICA/MCA distribution  Continue CIR 2.  Antithrombotics: -DVT/anticoagulation: Subcutaneous heparin             -antiplatelet therapy: Plavix 75 mg daily 3. Pain Management: Tylenol as needed 4. Mood: Team support             Antipsychotic agents: N/A 5. Neuropsych: This patient is capable of making decisions on his own behalf. 6. Skin/Wound Care: Routine skin checks 7. Fluids/Electrolytes/Nutrition: Routine in and outs.   8.  CAD with stenting.  Continue Plavix. 9.  Hypertension.  Norvasc 10 mg daily, Coreg 6.25 mg twice daily, hydralazine 25 mg 3 times daily.    Controlled on 4/3             Monitor with increased mobility 10.  Hyperlipidemia.  Continue Lipitor 11.  AKI on CKD.  Creatinine baselin 1.60.               Creatinine 1.67 on 4/2, labs ordered for Monday  Encourage fluids 12.  Morbid Obesity.  BMI 45.60.  Dietary follow-up. Encourage weight loss 13. Sleep disturbance             Remeron 7.5 mg nightly, melatonin 9 mg nightly  Slept well last night  LOS: 3 days A FACE TO FACE EVALUATION WAS PERFORMED  Meredith Staggers 03/19/2020, 8:58 AM

## 2020-03-20 NOTE — Progress Notes (Signed)
[place] PHYSICAL MEDICINE & REHABILITATION PROGRESS NOTE  Subjective/Complaints: Lying in bed taking nap. Just finished breakfast. No new problems. "thumbs up"  ROS: Limited due to cognitive/behavioral   Objective: Vital Signs: Blood pressure 104/71, pulse 88, temperature 98.5 F (36.9 C), temperature source Oral, resp. rate 16, height 6' (1.829 m), weight (!) 144.6 kg, SpO2 98 %. No results found. No results for input(s): WBC, HGB, HCT, PLT in the last 72 hours. Recent Labs    03/18/20 0553  NA 141  K 4.2  CL 107  CO2 22  GLUCOSE 99  BUN 27*  CREATININE 1.67*  CALCIUM 9.5    Physical Exam: BP 104/71 (BP Location: Left Arm)   Pulse 88   Temp 98.5 F (36.9 C) (Oral)   Resp 16   Ht 6' (1.829 m)   Wt (!) 144.6 kg   SpO2 98%   BMI 43.24 kg/m  Constitutional: No distress . Vital signs reviewed. HEENT: EOMI, oral membranes moist Neck: supple Cardiovascular: RRR without murmur. No JVD    Respiratory/Chest: CTA Bilaterally without wheezes or rales. Normal effort    GI/Abdomen: BS +, non-tender, non-distended Ext: no clubbing, cyanosis, or edema Psych: pleasant and cooperative Musc: Lower extremity edema 1+ Neurological: Alert Dysarthric Follows commands.   Fair insight and awareness Motor: RUE: Shoulder abduction 2+/5, distal 3-/5--no real changes RLE: HF, KE 2/5, ADF 3/5, some improvement  Assessment/Plan: 1. Functional deficits secondary to watershed infarct left ICA/MCA distribution which require 3+ hours per day of interdisciplinary therapy in a comprehensive inpatient rehab setting.  Physiatrist is providing close team supervision and 24 hour management of active medical problems listed below.  Physiatrist and rehab team continue to assess barriers to discharge/monitor patient progress toward functional and medical goals  Care Tool:  Bathing  Bathing activity did not occur: Safety/medical concerns Body parts bathed by patient: Chest, Abdomen, Left  upper leg, Left lower leg, Face, Right arm, Right upper leg, Right lower leg   Body parts bathed by helper: Left arm Body parts n/a: Front perineal area, Buttocks(completed earlier with nursing per pt report)   Bathing assist Assist Level: 2 Helpers     Upper Body Dressing/Undressing Upper body dressing   What is the patient wearing?: Pull over shirt    Upper body assist Assist Level: Maximal Assistance - Patient 25 - 49%    Lower Body Dressing/Undressing Lower body dressing      What is the patient wearing?: Pants, Incontinence brief     Lower body assist Assist for lower body dressing: Maximal Assistance - Patient 25 - 49%     Toileting Toileting    Toileting assist Assist for toileting: Maximal Assistance - Patient 25 - 49% Assistive Device Comment: Urinal   Transfers Chair/bed transfer  Transfers assist  Chair/bed transfer activity did not occur: Safety/medical concerns  Chair/bed transfer assist level: Dependent - mechanical lift(stedy)     Locomotion Ambulation   Ambulation assist   Ambulation activity did not occur: Safety/medical concerns  Assist level: 2 helpers Assistive device: Maxi Sky Max distance: 42ft   Walk 10 feet activity   Assist  Walk 10 feet activity did not occur: Safety/medical concerns  Assist level: 2 helpers Assistive device: Maxi Sky   Walk 50 feet activity   Assist Walk 50 feet with 2 turns activity did not occur: Safety/medical concerns         Walk 150 feet activity   Assist Walk 150 feet activity did not occur: Safety/medical concerns  Walk 10 feet on uneven surface  activity   Assist Walk 10 feet on uneven surfaces activity did not occur: Safety/medical concerns         Wheelchair     Assist Will patient use wheelchair at discharge?: Yes Type of Wheelchair: Manual    Wheelchair assist level: Total Assistance - Patient < 25% Max wheelchair distance: 5'    Wheelchair 50 feet with  2 turns activity    Assist        Assist Level: Total Assistance - Patient < 25%   Wheelchair 150 feet activity     Assist     Assist Level: Total Assistance - Patient < 25%    BP 104/71 (BP Location: Left Arm)   Pulse 88   Temp 98.5 F (36.9 C) (Oral)   Resp 16   Ht 6' (1.829 m)   Wt (!) 144.6 kg   SpO2 98%   BMI 43.24 kg/m    Medical Problem List and Plan: 1.  Increased right side hemiparesis with dysarthria secondary to watershed infarct left ICA/MCA distribution  Continue CIR PT, OT, SLP 2.  Antithrombotics: -DVT/anticoagulation: Subcutaneous heparin             -antiplatelet therapy: Plavix 75 mg daily 3. Pain Management: Tylenol as needed 4. Mood: Team support             Antipsychotic agents: N/A 5. Neuropsych: This patient is capable of making decisions on his own behalf. 6. Skin/Wound Care: Routine skin checks 7. Fluids/Electrolytes/Nutrition: Routine in and outs.   8.  CAD with stenting.  Continue Plavix. 9.  Hypertension.  Norvasc 10 mg daily, Coreg 6.25 mg twice daily, hydralazine 25 mg 3 times daily.    Controlled on 4/4             Monitor with increased mobility 10.  Hyperlipidemia.  Continue Lipitor 11.  AKI on CKD.  Creatinine baselin 1.60.               Creatinine 1.67 on 4/2, labs ordered for Monday  Encourage fluids 12.  Morbid Obesity.  BMI 45.60.  Dietary follow-up. Encourage weight loss 13. Sleep disturbance             Remeron 7.5 mg nightly, melatonin 9 mg nightly  Slept well last night again   LOS: 4 days A FACE TO [place] 03/20/2020, 8:01 AM

## 2020-03-21 ENCOUNTER — Inpatient Hospital Stay (HOSPITAL_COMMUNITY): Payer: Medicaid Other | Admitting: Occupational Therapy

## 2020-03-21 ENCOUNTER — Inpatient Hospital Stay (HOSPITAL_COMMUNITY): Payer: Medicaid Other | Admitting: Speech Pathology

## 2020-03-21 ENCOUNTER — Inpatient Hospital Stay (HOSPITAL_COMMUNITY): Payer: Medicaid Other

## 2020-03-21 NOTE — Progress Notes (Signed)
Speech Language Pathology Daily Session Note  Patient Details  Name: Zachary Mccann MRN: [id number] Date of Birth: Jan 16, 1971  Today's Date: 03/21/2020 SLP Individual Time: 1300-1330 SLP Individual Time Calculation (min): 30 min  Short Term Goals: Week 1: SLP Short Term Goal 1 (Week 1): With Total A pt will correctly voice appropriate phonemes and their cognate pair. SLP Short Term Goal 2 (Week 1): with Max A cues, pt will utilize speech intelligibility strategies to produce basic consonant vowel combinations in 5 out of 10 opportunities.  Skilled Therapeutic Interventions: Skilled treatment session focused on speech goals. SLP facilitated session by providing Min verbal and contextual cues for patient to "blow" on command without voicing. Patient able to switch between the voiced phoneme /z/ and the voiceless /s/ with Min verbal cues. Patient also performed the voiceless /p/ and /s/ in the final position but required Max A multimodal cues to produce the voiceless /p/ in the initial position. Patient required total A to produce /t/ and to demonstrated distorted vowels despite Max A multimodal cues. Patient reported difficulty with intelligibility due to just finishing lunch with frequent belching noted and requested to be seen prior to meals. Therefore, patient missed remaining 30 minutes of session. Patient left upright in the wheelchair with alarm on and all needs within reach. Continue with current plan of care.      Pain No/Denies Pain   Therapy/Group: Individual Therapy  Dewie Ahart 03/21/2020, 2:32 PM

## 2020-03-21 NOTE — Progress Notes (Signed)
Physical Therapy Session Note  Patient Details  Name: Zachary Mccann MRN: [id number] Date of Birth: [birth year]  Today's Date: 03/21/2020 PT Individual Time: 1500-1555 PT Individual Time Calculation (min): 55 min    Short Term Goals: Week 1:  PT Short Term Goal 1 (Week 1): Pt will be able to perform basic w/c level transfers with max assist PT Short Term Goal 2 (Week 1): Pt will be able to perform sit <> stand with max assist PT Short Term Goal 3 (Week 1): Pt will be able to perform w/c propulsion x 50'  Skilled Therapeutic Interventions/Progress Updates:    Pt seated in w/c upon PT arrival, agreeable to therapy tx and denies pain. Pt transported to the gym in w/c. Pt performed sit>stand max assist with RW and then performed stand pivot to the mat with RW and mod assist, increased time to complete, cues for foot placement and facilitation for weightshifting. Pt performed x 5 sit<>stands from mat with RW and mod assist working on techniques and strengthening, cues for hand placement. Pt worked on standing balance this session without UE support to perform horseshoe toss activity, x 2 trials with min assist for standing balance, reaching to promote increased R lateral weightshift. Stand pivot back to w/c with mod-max assist and RW +2 for safety, cues to continue turning before trying to sit. Pt worked on ambulation this session x8 ft and x 6 ft with RW and +2 for w/c follow, min-mod assist with decreased R lateral weightshift noted. Pt reports he did not like using maxisky last time, therapist provided education on safety precuations. Pt reports he does not like mask because he can't breath when walking. Pt transported back to room at end of session, left with needs in reach and chair alarm set.   Therapy Documentation Precautions:  Precautions Precautions: Fall Precaution Comments: R hemi Restrictions Weight Bearing Restrictions: No    Therapy/Group: Individual Therapy  Cresenciano Genre,  PT, DPT, CSRS 03/21/2020, 8:28 AM

## 2020-03-21 NOTE — Plan of Care (Signed)
  Problem: Consults Goal: RH STROKE PATIENT EDUCATION Description: Patient and family will be able to verbalize understanding of stroke prevention and how to identify stroke symptoms prior to discharge. Outcome: Progressing   Problem: RH BOWEL ELIMINATION Goal: RH STG MANAGE BOWEL WITH ASSISTANCE Description: STG Manage Bowel with min Assistance. Outcome: Progressing Goal: RH STG MANAGE BOWEL W/MEDICATION W/ASSISTANCE Description: STG Manage Bowel with Medication with min Assistance. Outcome: Progressing   Problem: RH BLADDER ELIMINATION Goal: RH STG MANAGE BLADDER WITH ASSISTANCE Description: STG Manage Bladder With min Assistance Outcome: Progressing   Problem: RH SKIN INTEGRITY Goal: RH STG SKIN FREE OF INFECTION/BREAKDOWN Description: Pt will be free of skin breakdown/infection with min assist while on CIR  Outcome: Progressing Goal: RH STG MAINTAIN SKIN INTEGRITY WITH ASSISTANCE Description: STG Maintain Skin Integrity With min Assistance. Outcome: Progressing Goal: RH STG ABLE TO PERFORM INCISION/WOUND CARE W/ASSISTANCE Description: STG Able To Perform Incision/Wound Care With mod Assistance. Outcome: Progressing   Problem: RH SAFETY Goal: RH STG ADHERE TO SAFETY PRECAUTIONS W/ASSISTANCE/DEVICE Description: STG Adhere to Safety Precautions With cues/reminders Assistance/Device. Outcome: Progressing   Problem: RH KNOWLEDGE DEFICIT Goal: RH STG INCREASE KNOWLEDGE OF HYPERTENSION Description: Pt will demonstrate understanding of management for HTN with diet regimen and medication compliance with min assist using handouts/booklets prior to DC  Outcome: Progressing Goal: RH STG INCREASE KNOWLEGDE OF HYPERLIPIDEMIA Description: Pt will demonstrate understanding of management for HLD with diet regimen and medication compliance with min assist using handouts/booklets prior to DC  Outcome: Progressing Goal: RH STG INCREASE KNOWLEDGE OF STROKE PROPHYLAXIS Description: Pt will  demonstrate understanding of management for stroke prevention with diet regimen and medication compliance with min assist using handouts/booklets prior to DC  Outcome: Progressing

## 2020-03-21 NOTE — Progress Notes (Signed)
[place] PHYSICAL MEDICINE & REHABILITATION PROGRESS NOTE  Subjective/Complaints:  Pt reports "im ok" with dysarthric speech- did give thumbs up to how he's doing and how his bowels are going.     ROS: Limited due to cognitive/behavioral   Objective: Vital Signs: Blood pressure 128/82, pulse 86, temperature 98.4 F (36.9 C), resp. rate 20, height 6' (1.829 m), weight (!) 145.2 kg, SpO2 100 %. No results found. No results for input(s): WBC, HGB, HCT, PLT in the last 72 hours. No results for input(s): NA, K, CL, CO2, GLUCOSE, BUN, CREATININE, CALCIUM in the last 72 hours.  Physical Exam: BP 128/82 (BP Location: Left Arm)   Pulse 86   Temp 98.4 F (36.9 C)   Resp 20   Ht 6' (1.829 m)   Wt (!) 145.2 kg   SpO2 100%   BMI 43.41 kg/m  Constitutional: sitting up in bed; appropriate, thumbs up, NAD HEENT: EOMI, oral membranes moist Neck: supple Cardiovascular: RRR; no JVD    Respiratory/Chest: CTA b/L- a little upper airway sounds; good air movement   GI/Abdomen: soft, NT, ND, (+)BS Ext: no clubbing, cyanosis, or edema Psych: pleasant and cooperative Musc: Lower extremity edema 1+ Neurological: Alert Very dysarthric- can understand 1 word sentences Follows commands.   Fair insight and awareness Motor: RUE: Shoulder abduction 2+/5, distal 3-/5--no real changes RLE: HF, KE 2/5, ADF 3/5, some improvement  Assessment/Plan: 1. Functional deficits secondary to watershed infarct left ICA/MCA distribution which require 3+ hours per day of interdisciplinary therapy in a comprehensive inpatient rehab setting.  Physiatrist is providing close team supervision and 24 hour management of active medical problems listed below.  Physiatrist and rehab team continue to assess barriers to discharge/monitor patient progress toward functional and medical goals  Care Tool:  Bathing  Bathing activity did not occur: Safety/medical concerns Body parts bathed by patient: Chest, Abdomen, Left  upper leg, Left lower leg, Face, Right arm, Right upper leg, Right lower leg   Body parts bathed by helper: Left arm Body parts n/a: Front perineal area, Buttocks(completed earlier with nursing per pt report)   Bathing assist Assist Level: 2 Helpers     Upper Body Dressing/Undressing Upper body dressing   What is the patient wearing?: Pull over shirt    Upper body assist Assist Level: Maximal Assistance - Patient 25 - 49%    Lower Body Dressing/Undressing Lower body dressing      What is the patient wearing?: Pants     Lower body assist Assist for lower body dressing: 2 Helpers     Toileting Toileting    Toileting assist Assist for toileting: Maximal Assistance - Patient 25 - 49% Assistive Device Comment: Urinal   Transfers Chair/bed transfer  Transfers assist  Chair/bed transfer activity did not occur: Safety/medical concerns  Chair/bed transfer assist level: 2 Helpers(sliding board)     Locomotion Ambulation   Ambulation assist   Ambulation activity did not occur: Safety/medical concerns  Assist level: 2 helpers Assistive device: Maxi Sky Max distance: 51ft   Walk 10 feet activity   Assist  Walk 10 feet activity did not occur: Safety/medical concerns  Assist level: 2 helpers Assistive device: Maxi Sky   Walk 50 feet activity   Assist Walk 50 feet with 2 turns activity did not occur: Safety/medical concerns         Walk 150 feet activity   Assist Walk 150 feet activity did not occur: Safety/medical concerns         Walk 10  feet on uneven surface  activity   Assist Walk 10 feet on uneven surfaces activity did not occur: Safety/medical concerns         Wheelchair     Assist Will patient use wheelchair at discharge?: Yes Type of Wheelchair: Manual    Wheelchair assist level: Total Assistance - Patient < 25% Max wheelchair distance: 5'    Wheelchair 50 feet with 2 turns activity    Assist        Assist Level:  Total Assistance - Patient < 25%   Wheelchair 150 feet activity     Assist     Assist Level: Total Assistance - Patient < 25%    BP 128/82 (BP Location: Left Arm)   Pulse 86   Temp 98.4 F (36.9 C)   Resp 20   Ht 6' (1.829 m)   Wt (!) 145.2 kg   SpO2 100%   BMI 43.41 kg/m    Medical Problem List and Plan: 1.  Increased right side hemiparesis with dysarthria secondary to watershed infarct left ICA/MCA distribution  Continue CIR PT, OT, SLP 2.  Antithrombotics: -DVT/anticoagulation: Subcutaneous heparin             -antiplatelet therapy: Plavix 75 mg daily 3. Pain Management: Tylenol as needed  4/5- denies pain. 4. Mood: Team support             Antipsychotic agents: N/A 5. Neuropsych: This patient is capable of making decisions on his own behalf. 6. Skin/Wound Care: Routine skin checks 7. Fluids/Electrolytes/Nutrition: Routine in and outs.   8.  CAD with stenting.  Continue Plavix. 9.  Hypertension.  Norvasc 10 mg daily, Coreg 6.25 mg twice daily, hydralazine 25 mg 3 times daily.    4/5- BP controlled- con't meds             Monitor with increased mobility 10.  Hyperlipidemia.  Continue Lipitor 11.  AKI on CKD.  Creatinine baselin 1.60.               Creatinine 1.67 on 4/2, labs ordered for Monday  4/5- labs not drawn- will order for tomorrow  Encourage fluids 12.  Morbid Obesity.  BMI 45.60.  Dietary follow-up. Encourage weight loss 13. Sleep disturbance             Remeron 7.5 mg nightly, melatonin 9 mg nightly  4/5- sleeping well- con't regimen  LOS: 5 days A FACE TO FACE EVALUATION WAS PERFORMED  Ziare Orrick 03/21/2020, 3:50 PM

## 2020-03-21 NOTE — Progress Notes (Signed)
Occupational Therapy Session Note  Patient Details  Name: Zachary Mccann MRN: [id number] Date of Birth: [birth year]  Today's Date: 03/21/2020 OT Individual Time: 7322-0254 OT Individual Time Calculation (min): 48 min    Short Term Goals: Week 1:  OT Short Term Goal 1 (Week 1): Pt will be able to push from L sidelying to sit with mod A to prep for transfer. OT Short Term Goal 2 (Week 1): Pt will be able to complete squat pivot to [facility] with max A of 1. OT Short Term Goal 3 (Week 1): Pt will be able to stand fully upright in stedy to allow caregivers to manage clothing over hips pre/post toileting. OT Short Term Goal 4 (Week 1): Pt will don shirt with min A. OT Short Term Goal 5 (Week 1): Pt will perform self ROM with mod A.  Skilled Therapeutic Interventions/Progress Updates:    Pt completed supine to sit EOB with max assist to start session, on the right side of the bed.  He was able to work on donning his shorts from the EOB.  Mod assist for placing them over the RLE with min assist for the left.  He needed total assist +2 (pt 50%) for sit to stand from the EOB to pull them over his hips.  He was able to then transfer via sliding board with mod assist to the wheelchair.  He was able to complete oral hygiene in sitting at the sink with setup assist.  Therapist assisted with squeezing out the toothpaste with the RUE and min facilitation.  Once complete, he was taken down to the ortho gym where he worked on sit to stand from the wheelchair with use of the bariatric RW for support.  He was able to complete sit to stand with max assist and maintain with mod facilitation for up to 2 mins.  In standing he continues to demonstrate increased trunk flexion as well as increased lean to the left.  Finished session with transfer back to the room and pt setup for completion of eating breakfast.  Pt left with call button and phone in reach with safety belt in place.    Therapy Documentation Precautions:   Precautions Precautions: Fall Precaution Comments: R hemi Restrictions Weight Bearing Restrictions: No  Pain: Pain Assessment Pain Scale: 0-10 Pain Score: 0-No pain Faces Pain Scale: Hurts a little bit Pain Type: Acute pain Pain Location: Knee Pain Orientation: Right Pain Descriptors / Indicators: Discomfort Pain Onset: With Activity Pain Intervention(s): Repositioned;Emotional support ADL: See Care Tool Section for some details of mobility and selfcare  Therapy/Group: Individual Therapy  Danyetta Gillham OTR/L 03/21/2020, 12:07 PM

## 2020-03-21 NOTE — Progress Notes (Signed)
Occupational Therapy Session Note  Patient Details  Name: Zachary Mccann MRN: [id number] Date of Birth: [birth year]  Today's Date: 03/21/2020 OT Individual Time: 0930-1000 OT Individual Time Calculation (min): 30 min    Short Term Goals: Week 1:  OT Short Term Goal 1 (Week 1): Pt will be able to push from L sidelying to sit with mod A to prep for transfer. OT Short Term Goal 2 (Week 1): Pt will be able to complete squat pivot to [facility] with max A of 1. OT Short Term Goal 3 (Week 1): Pt will be able to stand fully upright in stedy to allow caregivers to manage clothing over hips pre/post toileting. OT Short Term Goal 4 (Week 1): Pt will don shirt with min A. OT Short Term Goal 5 (Week 1): Pt will perform self ROM with mod A.  Skilled Therapeutic Interventions/Progress Updates:    Pt seen this session to focus on functional mobility of scoot pivot transfer to progress to squat pivot transfer.  Pt received in wc and taken to the gym.   Pt worked on transferring w/c to mat to his R and back to chair to his L and then again in the reverse direction.   For all 4 transfers, used sliding board for support but did not have pt slide on board. Focused on forward weight shift and lifting his buttocks as high as possible to shift his hips over.   1 person A to hold the board, 1 in front to guide pt with min -mod A with forward weight shift. Pt needed max cues initially, progressing to min cues to complete movement pattern.  On the last transfer to his R he had the most efficient movement of scooting farther as he was lifting his hips higher.  Focused on speed for smoother movement patterns.  Pt returned to room with all needs met and bed alarm set.  Therapy Documentation Precautions:  Precautions Precautions: Fall Precaution Comments: R hemi Restrictions Weight Bearing Restrictions: No   Pain: Pain Assessment Pain Scale: 0-10 Pain Score: 0-No pain  Therapy/Group: Individual  Therapy  Becky Colan 03/21/2020, 12:29 PM

## 2020-03-22 ENCOUNTER — Inpatient Hospital Stay (HOSPITAL_COMMUNITY): Payer: Medicaid Other | Admitting: Speech Pathology

## 2020-03-22 ENCOUNTER — Inpatient Hospital Stay (HOSPITAL_COMMUNITY): Payer: Medicaid Other | Admitting: Occupational Therapy

## 2020-03-22 ENCOUNTER — Inpatient Hospital Stay (HOSPITAL_COMMUNITY): Payer: Medicaid Other | Admitting: Physical Therapy

## 2020-03-22 ENCOUNTER — Encounter (HOSPITAL_COMMUNITY): Payer: Medicaid Other | Admitting: Psychology

## 2020-03-22 DIAGNOSIS — F801 Expressive language disorder: Secondary | ICD-10-CM

## 2020-03-22 LAB — CBC WITH DIFFERENTIAL/PLATELET
Abs Immature Granulocytes: 0.04 K/uL (ref 0.00–0.07)
Basophils Absolute: 0 K/uL (ref 0.0–0.1)
Basophils Relative: 1 %
Eosinophils Absolute: 0.2 K/uL (ref 0.0–0.5)
Eosinophils Relative: 2 %
HCT: 37.3 % — ABNORMAL LOW (ref 39.0–52.0)
Hemoglobin: 12.1 g/dL — ABNORMAL LOW (ref 13.0–17.0)
Immature Granulocytes: 1 %
Lymphocytes Relative: 22 %
Lymphs Abs: 1.7 K/uL (ref 0.7–4.0)
MCH: 30.6 pg (ref 26.0–34.0)
MCHC: 32.4 g/dL (ref 30.0–36.0)
MCV: 94.4 fL (ref 80.0–100.0)
Monocytes Absolute: 0.8 K/uL (ref 0.1–1.0)
Monocytes Relative: 10 %
Neutro Abs: 5 K/uL (ref 1.7–7.7)
Neutrophils Relative %: 64 %
Platelets: 252 K/uL (ref 150–400)
RBC: 3.95 MIL/uL — ABNORMAL LOW (ref 4.22–5.81)
RDW: 12.5 % (ref 11.5–15.5)
WBC: 7.7 K/uL (ref 4.0–10.5)
nRBC: 0 % (ref 0.0–0.2)

## 2020-03-22 LAB — BASIC METABOLIC PANEL
Anion gap: 10 (ref 5–15)
BUN: 25 mg/dL — ABNORMAL HIGH (ref 6–20)
CO2: 22 mmol/L (ref 22–32)
Calcium: 9.4 mg/dL (ref 8.9–10.3)
Chloride: 104 mmol/L (ref 98–111)
Creatinine, Ser: 1.59 mg/dL — ABNORMAL HIGH (ref 0.61–1.24)
GFR calc Af Amer: 58 mL/min — ABNORMAL LOW (ref >60)
GFR calc non Af Amer: 50 mL/min — ABNORMAL LOW (ref >60)
Glucose, Bld: 94 mg/dL (ref 70–99)
Potassium: 4.5 mmol/L (ref 3.5–5.1)
Sodium: 136 mmol/L (ref 135–145)

## 2020-03-22 NOTE — Progress Notes (Signed)
Speech Language Pathology Daily Session Note  Patient Details  Name: Zachary Mccann MRN: [id number] Date of Birth: [birth year]  Today's Date: 03/22/2020 SLP Individual Time: 1055-1150 SLP Individual Time Calculation (min): 55 min  Short Term Goals: Week 1: SLP Short Term Goal 1 (Week 1): With Total A pt will correctly voice appropriate phonemes and their cognate pair. SLP Short Term Goal 2 (Week 1): with Max A cues, pt will utilize speech intelligibility strategies to produce basic consonant vowel combinations in 5 out of 10 opportunities.  Skilled Therapeutic Interventions: Skilled treatment session focused on speech goals. SLP facilitated session by providing Mod A verbal cues for use of speech intelligibility strategies at the word level while producing minimal pairs with 75% intelligibility. SLP also facilitated session by providing overall Mod-Max A verbal cues for use of speech intelligibility strategies at the word level while communicating biographical information. Patient left upright in wheelchair with alarm on and all needs within reach. Continue with current plan of care.      Pain No/Denies pain   Therapy/Group: Individual Therapy  Renny Gunnarson 03/22/2020, 11:53 AM

## 2020-03-22 NOTE — Consult Note (Signed)
Neuropsychological Consultation   Patient:   Zachary Mccann   DOB:   July 26, 1971  MR Number:  [id number]  Location:  [facility] [address] CENTER [address] [id number] [place] [address] Dept: [phone] Loc: [phone]           Date of Service:   03/22/2020  Start Time:   2 PM End Time:   3 PM  Provider/Observer:  Arley Phenix, Psy.D.       Clinical Neuropsychologist       Billing Code/Service: [id number]  Chief Complaint:    Zachary Mccann is a 49 year old male with a history of hypertension and tobacco abuse, CAD status post non-STEMI 04/2018 as well as left ICA occlusion in July 2020 with associated CVA causing right-sided hemiparesis and slurred speech.  The patient did receive inpatient rehabilitation services between 7/31 and 08/12/2019.  He was discharged home with mid to mod assist level.  The patient presented on 03/11/2020 with increased right hemiparesis.  MRI brain was positive for progressive deep watershed ischemia and development of left side wallerian degeneration since July of last year.  There appeared to be further degeneration of descending neuronal fibers from stroke in July 2020 along with the watershed events prior that are leading to his deterioration.  The patient was again admitted for the comprehensive inpatient rehabilitation program.  Reason for Service:  The patient was referred for neuropsychological consultation due to adjustment coping issues due to significant residual cognitive deficits and in particular motor deficits and expressive language deficits.  Below is the HPI for the current mission.  HPI: Zachary Mccann is a 49 year old right-handed male with history of hypertension and tobacco abuse, CAD status post non-STEMI 04/2018 as well as left ICA occlusion in July 2020 with associated CVA causing right-sided hemiparesis and slurred speech maintained on Plavix and received inpatient  rehab services 07/17/2019 to 08/12/2019 discharged to home with min mod assist level.  History taken from chart review due to dysarthria. Patient lives with his girlfriend.  Two-level home bed and bath on main level with ramped entrance.  Girlfriend did assist with some ADLs.  He do have 6 children in the home and for reportedly handicapped.  He presented on 03/11/20 with increasing right hemiparesis. MRI brain personally reviewed, unremarkable for acute intracranial process.  Per report, positive for progressive deep watershed ischemia and development of left side Wallerian degeneration since July of last year.  Admission chemistries with creatinine 2.41 with baseline 1.6, TSH 5.449, SARS coronavirus negative.  Neurology follow-up currently maintained on Plavix for CVA prophylaxis.  Subcutaneous heparin for DVT prophylaxis.  Maintained on a regular diet.  Renal function stabilized with gentle IV fluids latest creatinine 1.47.  Therapy evaluations completed and patient was admitted for a comprehensive rehab program. Please see preadmission assessment from earlier today as well.  Current Status:  The patient displayed significant expressive language deficits with significant dysarthria and limited production.  The patient primarily used a thumbs up or thumbs down to express himself and there was no instance where he put together any sentence structure and the words that he did say were extremely hard to understand with poor articulation.  However, the patient was able to effectively express that he did not feel like he was dealing with significant depression at this time and his mood was not keeping him from participating in therapeutic efforts.  He did acknowledge frustration about the deficits and feels like he  has been getting worse since July of last year.  Behavioral Observation: Zachary Mccann  presents as a 49 y.o.-year-old Right Caucasian Male who appeared his stated age. his dress was Appropriate and he  was Well Groomed and his manners were Appropriate to the situation.  his participation was indicative of Appropriate and Redirectable behaviors.  There were any physical disabilities noted.  he displayed an appropriate level of cooperation and motivation.     Interactions:    Active Appropriate and Redirectable  Attention:   abnormal and attention span appeared shorter than expected for age  Memory:   within normal limits; recent and remote memory intact  Visuo-spatial:  not examined  Speech (Volume):  normal  Speech:   non-fluent aphasia; slurred  Thought Process:  Coherent and Relevant  Though Content:  WNL; not suicidal and not homicidal  Orientation:   person, place and time/date  Judgment:   Fair  Planning:   Poor  Affect:    Appropriate  Mood:    Euthymic  Insight:   Fair  Intelligence:   normal  Medical History:   Past Medical History:  Diagnosis Date  . CAD (coronary artery disease)    a. s/p NSTEMI in 04/2018 with DES to LCx.   [name] Hypertension   . Myocardial infarction ([place])   . Pneumonia   . Stroke [facility])    Psychiatric History:  No prior psychiatric history  Family Med/Psych History:  Family History  Problem Relation Age of Onset  . Hypertension Mother   . Stroke Mother   . Dementia Mother   . Hypertension Father   . Stroke Father   . Cancer Father   . Alcoholism Father   . Cancer Sister      Impression/DX:  Zachary Mccann is a 49 year old male with a history of hypertension and tobacco abuse, CAD status post non-STEMI 04/2018 as well as left ICA occlusion in July 2020 with associated CVA causing right-sided hemiparesis and slurred speech.  The patient did receive inpatient rehabilitation services between 7/31 and 08/12/2019.  He was discharged home with mid to mod assist level.  The patient presented on 03/11/2020 with increased right hemiparesis.  MRI brain was positive for progressive deep watershed ischemia and development of left side wallerian  degeneration since July of last year.  There appeared to be further degeneration of descending neuronal fibers from stroke in July 2020 along with the watershed events prior that are leading to his deterioration.  The patient was again admitted for the comprehensive inpatient rehabilitation program.  The patient displayed significant expressive language deficits with significant dysarthria and limited production.  The patient primarily used a thumbs up or thumbs down to express himself and there was no instance where he put together any sentence structure and the words that he did say were extremely hard to understand with poor articulation.  However, the patient was able to effectively express that he did not feel like he was dealing with significant depression at this time and his mood was not keeping him from participating in therapeutic efforts.  He did acknowledge frustration about the deficits and feels like he has been getting worse since July of last year.  Disposition/Plan:  Today reviewed his current status and adjustment to continue deterioration from cerebral vascular mediated deficits due to cerebral infarction, watershed distribution pattern with wallerian degeneration.  Diagnosis:    Cerebral infarction, watershed distribution, unilateral, chronic - Plan: Ambulatory referral to Neurology  Electronically Signed   _______________________ Ilean Skill, Psy.D.

## 2020-03-22 NOTE — Progress Notes (Signed)
Occupational Therapy Session Note  Patient Details  Name: Zachary Mccann MRN: [id number] Date of Birth: [birth year]  Today's Date: 03/22/2020 OT Individual Time: 4665-9935 OT Individual Time Calculation (min): 76 min    Short Term Goals: Week 1:  OT Short Term Goal 1 (Week 1): Pt will be able to push from L sidelying to sit with mod A to prep for transfer. OT Short Term Goal 2 (Week 1): Pt will be able to complete squat pivot to [facility] with max A of 1. OT Short Term Goal 3 (Week 1): Pt will be able to stand fully upright in stedy to allow caregivers to manage clothing over hips pre/post toileting. OT Short Term Goal 4 (Week 1): Pt will don shirt with min A. OT Short Term Goal 5 (Week 1): Pt will perform self ROM with mod A.  Skilled Therapeutic Interventions/Progress Updates:    Pt completed supine to sit EOB with max assist on the left side to start session. Pt needed HOB elevated and then needed assistance with bringing the RLE off of the bed and scooting to the edge.  He then worked on donning his pants, pullover shirt, and his shoes.  Max demonstrational cueing for hemi dressing techniques when attempting to donn the pullover shirt, along with overall max assist.  He also needed total assist +2 (pt 45%) for donning pants over his hips in standing, but he was able to donn them up over his LEs with mod assist.  He then needed total assist for donning TEDs with mod assist for donning his velcro shoes.  Next, had him complete stand pivot transfer to the wheelchair with total assist +2 (pt 50%) and use of the RW.  He was then taken down to the therapy gym where he completed stand pivot transfers with the RW and mod demonstrational cueing for upright trunk and head and mod assist for transfers.  He was able to complete functional mobility with use of the RW for support for 28' X 2 with mod assist overall and mod demonstrational cueing to not step too close to the walker and for sequencing secondary to  motor planning difficulty.  Ace wrapped the right knee for support during mobility and transfers as well.  Finished session with transfer back to the wheelchair with return to the room.  Pt left with call button and phone in reach with safety belt in place with pt staying up in the wheelchair.    Therapy Documentation Precautions:  Precautions Precautions: Fall Precaution Comments: R hemi Restrictions Weight Bearing Restrictions: No   Pain: Pain Assessment Pain Scale: Faces Faces Pain Scale: Hurts a little bit Pain Type: Chronic pain Pain Location: Knee Pain Orientation: Right Pain Descriptors / Indicators: Discomfort Pain Onset: With Activity Pain Intervention(s): MD notified (Comment);Repositioned;Cold applied ADL: See Care Tool Section for some details of mobility and selfcare  Therapy/Group: Individual Therapy  Dinita Migliaccio OTR/L 03/22/2020, 12:13 PM

## 2020-03-22 NOTE — Progress Notes (Signed)
[place] PHYSICAL MEDICINE & REHABILITATION PROGRESS NOTE  Subjective/Complaints: Has some right knee blisters which he believes are from the heating pad. Increased warmth to right knee as well as knee pain. Has not been using ice.  Sleeping poorly at night but believes this will resolve once he returns home.   ROS: Limited due to cognitive/behavioral   Objective: Vital Signs: Blood pressure 120/76, pulse 84, temperature 98 F (36.7 C), resp. rate 18, height 6' (1.829 m), weight (!) 144.9 kg, SpO2 95 %. No results found. Recent Labs    03/22/20 0508  WBC 7.7  HGB 12.1*  HCT 37.3*  PLT 252   Recent Labs    03/22/20 0508  NA 136  K 4.5  CL 104  CO2 22  GLUCOSE 94  BUN 25*  CREATININE 1.59*  CALCIUM 9.4    Physical Exam: BP 120/76 (BP Location: Left Arm)   Pulse 84   Temp 98 F (36.7 C)   Resp 18   Ht 6' (1.829 m)   Wt (!) 144.9 kg   SpO2 95%   BMI 43.32 kg/m  Constitutional: sitting up in bed; appropriate, thumbs up, NAD HEENT: EOMI, oral membranes moist Neck: supple Cardiovascular: RRR; no JVD    Respiratory/Chest: CTA b/L- a little upper airway sounds; good air movement   GI/Abdomen: soft, NT, ND, (+)BS Ext: no clubbing, cyanosis, or edema Psych: pleasant and cooperative Musc: Lower extremity edema 1+ Neurological: Alert Very dysarthric- can understand 1 word sentences Follows commands.   Fair insight and awareness Motor: RUE: Shoulder abduction 2+/5, distal 3-/5--no real changes RLE: HF, KE 2/5, ADF 3/5, some improvement Skin: 2 small blisters on right knee, fluid-filled.   Assessment/Plan: 1. Functional deficits secondary to watershed infarct left ICA/MCA distribution which require 3+ hours per day of interdisciplinary therapy in a comprehensive inpatient rehab setting.  Physiatrist is providing close team supervision and 24 hour management of active medical problems listed below.  Physiatrist and rehab team continue to assess barriers to  discharge/monitor patient progress toward functional and medical goals  Care Tool:  Bathing  Bathing activity did not occur: Safety/medical concerns Body parts bathed by patient: Chest, Abdomen, Left upper leg, Left lower leg, Face, Right arm, Right upper leg, Right lower leg   Body parts bathed by helper: Left arm Body parts n/a: Front perineal area, Buttocks(completed earlier with nursing per pt report)   Bathing assist Assist Level: 2 Helpers     Upper Body Dressing/Undressing Upper body dressing   What is the patient wearing?: Pull over shirt    Upper body assist Assist Level: Maximal Assistance - Patient 25 - 49%    Lower Body Dressing/Undressing Lower body dressing      What is the patient wearing?: Pants     Lower body assist Assist for lower body dressing: 2 Helpers     Toileting Toileting    Toileting assist Assist for toileting: Maximal Assistance - Patient 25 - 49% Assistive Device Comment: Urinal   Transfers Chair/bed transfer  Transfers assist  Chair/bed transfer activity did not occur: Safety/medical concerns  Chair/bed transfer assist level: 2 Helpers     Locomotion Ambulation   Ambulation assist   Ambulation activity did not occur: Safety/medical concerns  Assist level: 2 helpers Assistive device: Walker-rolling Max distance: 6 ft   Walk 10 feet activity   Assist  Walk 10 feet activity did not occur: Safety/medical concerns  Assist level: 2 helpers Assistive device: [employer]   Walk 50 feet  activity   Assist Walk 50 feet with 2 turns activity did not occur: Safety/medical concerns         Walk 150 feet activity   Assist Walk 150 feet activity did not occur: Safety/medical concerns         Walk 10 feet on uneven surface  activity   Assist Walk 10 feet on uneven surfaces activity did not occur: Safety/medical concerns         Wheelchair     Assist Will patient use wheelchair at discharge?: Yes Type of  Wheelchair: Manual    Wheelchair assist level: Total Assistance - Patient < 25% Max wheelchair distance: 5'    Wheelchair 50 feet with 2 turns activity    Assist        Assist Level: Total Assistance - Patient < 25%   Wheelchair 150 feet activity     Assist     Assist Level: Total Assistance - Patient < 25%    BP 120/76 (BP Location: Left Arm)   Pulse 84   Temp 98 F (36.7 C)   Resp 18   Ht 6' (1.829 m)   Wt (!) 144.9 kg   SpO2 95%   BMI 43.32 kg/m    Medical Problem List and Plan: 1.  Increased right side hemiparesis with dysarthria secondary to watershed infarct left ICA/MCA distribution  Continue CIR PT, OT, SLP 2.  Antithrombotics: -DVT/anticoagulation: Subcutaneous heparin             -antiplatelet therapy: Plavix 75 mg daily 3. Pain Management: Tylenol as needed  4/6-has right knee pain. Recommended icing; will place general nursing order.  4. Mood: Team support             Antipsychotic agents: N/A 5. Neuropsych: This patient is capable of making decisions on his own behalf. 6. Skin/Wound Care: Routine skin checks 7. Fluids/Electrolytes/Nutrition: Routine in and outs.   8.  CAD with stenting.  Continue Plavix. 9.  Hypertension.  Norvasc 10 mg daily, Coreg 6.25 mg twice daily, hydralazine 25 mg 3 times daily.    4/6- BP well controlled             Monitor with increased mobility 10.  Hyperlipidemia.  Continue Lipitor 11.  AKI on CKD.  Creatinine baselin 1.60.               Creatinine 1.67 on 4/2, 4/6- improved.   Encourage fluids 12.  Morbid Obesity.  BMI 45.60.  Dietary follow-up. Encourage weight loss 13. Sleep disturbance             Remeron 7.5 mg nightly, melatonin 9 mg nightly  4/5- sleeping well- con't regimen 14. Anemia: 46: hgb decreased from 13.1 to 12.1. Trend tomorrow.   LOS: 6 days A FACE TO FACE EVALUATION WAS PERFORMED  Clint Bolder P Dreama Kuna 03/22/2020, 8:59 AM

## 2020-03-22 NOTE — Plan of Care (Signed)
  Problem: Consults Goal: RH STROKE PATIENT EDUCATION Description: Patient and family will be able to verbalize understanding of stroke prevention and how to identify stroke symptoms prior to discharge. Outcome: Progressing   Problem: RH BOWEL ELIMINATION Goal: RH STG MANAGE BOWEL WITH ASSISTANCE Description: STG Manage Bowel with min Assistance. Outcome: Progressing Goal: RH STG MANAGE BOWEL W/MEDICATION W/ASSISTANCE Description: STG Manage Bowel with Medication with min Assistance. Outcome: Progressing   Problem: RH BLADDER ELIMINATION Goal: RH STG MANAGE BLADDER WITH ASSISTANCE Description: STG Manage Bladder With min Assistance Outcome: Progressing   Problem: RH SKIN INTEGRITY Goal: RH STG SKIN FREE OF INFECTION/BREAKDOWN Description: Pt will be free of skin breakdown/infection with min assist while on CIR  Outcome: Progressing Goal: RH STG MAINTAIN SKIN INTEGRITY WITH ASSISTANCE Description: STG Maintain Skin Integrity With min Assistance. Outcome: Progressing Goal: RH STG ABLE TO PERFORM INCISION/WOUND CARE W/ASSISTANCE Description: STG Able To Perform Incision/Wound Care With mod Assistance. Outcome: Progressing   Problem: RH SAFETY Goal: RH STG ADHERE TO SAFETY PRECAUTIONS W/ASSISTANCE/DEVICE Description: STG Adhere to Safety Precautions With cues/reminders Assistance/Device. Outcome: Progressing   Problem: RH KNOWLEDGE DEFICIT Goal: RH STG INCREASE KNOWLEDGE OF HYPERTENSION Description: Pt will demonstrate understanding of management for HTN with diet regimen and medication compliance with min assist using handouts/booklets prior to DC  Outcome: Progressing Goal: RH STG INCREASE KNOWLEGDE OF HYPERLIPIDEMIA Description: Pt will demonstrate understanding of management for HLD with diet regimen and medication compliance with min assist using handouts/booklets prior to DC  Outcome: Progressing Goal: RH STG INCREASE KNOWLEDGE OF STROKE PROPHYLAXIS Description: Pt will  demonstrate understanding of management for stroke prevention with diet regimen and medication compliance with min assist using handouts/booklets prior to DC  Outcome: Progressing   

## 2020-03-22 NOTE — Plan of Care (Signed)
  Problem: RH Dressing Goal: LTG Patient will perform lower body dressing w/assist (OT) Description: LTG: Patient will perform lower body dressing with assist, with/without cues in positioning using equipment (OT) Flowsheets (Taken 03/22/2020 1646) LTG: Pt will perform lower body dressing with assistance level of: (goal upgraded based on progress) Moderate Assistance - Patient 50 - 74%   Problem: RH Toileting Goal: LTG Patient will perform toileting task (3/3 steps) with assistance level (OT) Description: LTG: Patient will perform toileting task (3/3 steps) with assistance level (OT)  Flowsheets (Taken 03/22/2020 1646) LTG: Pt will perform toileting task (3/3 steps) with assistance level: (goal upgraded based on progress) Moderate Assistance - Patient 50 - 74%

## 2020-03-22 NOTE — Progress Notes (Signed)
Physical Therapy Session Note  Patient Details  Name: Zachary Mccann MRN: [id number] Date of Birth: Feb 21, 1971  Today's Date: 03/22/2020 PT Individual Time: 6295-2841 PT Individual Time Calculation (min): 70 min   Short Term Goals: Week 1:  PT Short Term Goal 1 (Week 1): Pt will be able to perform basic w/c level transfers with max assist PT Short Term Goal 2 (Week 1): Pt will be able to perform sit <> stand with max assist PT Short Term Goal 3 (Week 1): Pt will be able to perform w/c propulsion x 50'  Skilled Therapeutic Interventions/Progress Updates:   Pt received sitting in w/c and agreeable to therapy session.  Transported to/from gym in w/c for time management and energy conservation. Therapist ACE wrapped R knee for pain management during session. Sit>stand w/c>RW with max assist for lifting into standing and cuing for R LE and UE placement prior to standing as well as cuing/manual facilitation for increased anterior weight shift. Gait training ~71ft, ~28ft (seated break between) using RW with min assist of 1 person and +2 w/c follow - continues to demonstrate forward flexed trunk with increased hip flexion, decreased R lateral weight shift during stance with compensation via increased B UE weightbearing, decreased R LE stance time, decreased B LE foot clearance during swing, and significantly decreased gait speed. Attempted seated R LE active assisted long arc quads x10 reps with pt demonstrating minimal quad activation and only able to move through ~15degrees of AROM. L stand pivot transfer w/c>EOM using RW with mod/max assist for lifting into standing and min/mod assist for balance while turning to sit - cuing not to initiate sitting prior to turning fully but pt has significant difficulty standing on R LE to step L foot back during transfer. Sit>supine with min assist and max multimodal cuing for sequencing of task to increase pt independence. Supine hip flexor stretch - pt reports  strong stretch just lying flat on mat. Supine R LE NMR via heel slides targeting hip/knee flexion strength 2x10 reps with assist to decreased hip abduction/external rotation. Attempted supine bridging but despite manual facilitation and max multimodal cuing pt unable to lift hips from mat. Supine>sitting EOM with min/mod assist and again max multimodal cuing for sequencing to increase pt independence. R stand pivot EOM>w/c using RW with again min/mod assist of 1 (+2 present for safety) and pt continuing to demonstrate difficulty standing on R LE while stepping L foot backwards during transfer. Standing>tall kneeing on mat table with B UE support on bench and +2 min/mod assist for lifting LEs onto mat and balance. In tall kneeling on mat, worked on maintaining position without UE support x2 minutes then pt requesting to get out of the position. Tall kneeling>L sidelying>sitting EOM with min/mod assist and max cuing for sequencing. Stand pivot EOM>w/c using RW with min/mod assist as described above. Transported to day room in w/c. Stand pivot w/c<>Nustep using RW with min/mod assist (+2 present for safety) and cuing for AD management and LE stepping. On Nustep targeted R LE strengthening by using it similarly to a single leg press machine against level 2 resistance and therapist manually facilitating improved alignment to decreased R hip abduction/external rotation x3 minutes. Transitioned to B LE alternating reciprocal pattern against level 4 resistance for 3 minutes with therapist continuing to guide improved R LE alignment. Stand pivot back to w/c and transported back to room. Pt's wife in his room - pt left seated in w/c with needs in reach and seat belt  alarm on.  Therapy Documentation Precautions:  Precautions Precautions: Fall Precaution Comments: R hemi Restrictions Weight Bearing Restrictions: No  Pain:   Reports R knee joint pain - unrated - therapist ACE wrapped it during session and modified  activities for pain management.    Therapy/Group: Individual Therapy  Tawana Scale, PT, DPT 03/22/2020, 4:15 PM

## 2020-03-23 ENCOUNTER — Inpatient Hospital Stay (HOSPITAL_COMMUNITY): Payer: Medicaid Other | Admitting: Occupational Therapy

## 2020-03-23 ENCOUNTER — Inpatient Hospital Stay (HOSPITAL_COMMUNITY): Payer: Medicaid Other

## 2020-03-23 ENCOUNTER — Inpatient Hospital Stay (HOSPITAL_COMMUNITY): Payer: Medicaid Other | Admitting: Speech Pathology

## 2020-03-23 LAB — CBC
HCT: 35.8 % — ABNORMAL LOW (ref 39.0–52.0)
Hemoglobin: 11.8 g/dL — ABNORMAL LOW (ref 13.0–17.0)
MCH: 31.1 pg (ref 26.0–34.0)
MCHC: 33 g/dL (ref 30.0–36.0)
MCV: 94.2 fL (ref 80.0–100.0)
Platelets: 250 10*3/uL (ref 150–400)
RBC: 3.8 MIL/uL — ABNORMAL LOW (ref 4.22–5.81)
RDW: 12.6 % (ref 11.5–15.5)
WBC: 7.4 10*3/uL (ref 4.0–10.5)
nRBC: 0 % (ref 0.0–0.2)

## 2020-03-23 MED ORDER — HYDROCERIN EX CREA
TOPICAL_CREAM | Freq: Two times a day (BID) | CUTANEOUS | Status: DC
  Administered 2020-03-23 – 2020-03-24 (×2): 1 via TOPICAL
  Filled 2020-03-23: qty 113

## 2020-03-23 NOTE — Progress Notes (Signed)
[facility] PHYSICAL MEDICINE & REHABILITATION PROGRESS NOTE  Subjective/Complaints: Hgb decrease from 12.1 to 11.8. Had BM yesterday.  Still has some right knee pain, has been icing which helps. Blisters are stable, well bandaged.   ROS: Limited due to cognitive/behavioral   Objective: Vital Signs: Blood pressure 128/84, pulse 87, temperature 97.8 F (36.6 C), resp. rate 17, height 6' (1.829 m), weight (!) 147.6 kg, SpO2 96 %. No results found. Recent Labs    03/22/20 0508 03/23/20 0514  WBC 7.7 7.4  HGB 12.1* 11.8*  HCT 37.3* 35.8*  PLT 252 250   Recent Labs    03/22/20 0508  NA 136  K 4.5  CL 104  CO2 22  GLUCOSE 94  BUN 25*  CREATININE 1.59*  CALCIUM 9.4    Physical Exam: BP 128/84 (BP Location: Left Arm)   Pulse 87   Temp 97.8 F (36.6 C)   Resp 17   Ht 6' (1.829 m)   Wt (!) 147.6 kg   SpO2 96%   BMI 44.13 kg/m  Constitutional: sitting up in bed; appropriate, thumbs up, NAD HEENT: EOMI, oral membranes moist Neck: supple Cardiovascular: RRR; no JVD    Respiratory/Chest: CTA b/L- a little upper airway sounds; good air movement   GI/Abdomen: soft, NT, ND, (+)BS Ext: no clubbing, cyanosis, or edema Psych: pleasant and cooperative Musc: Lower extremity edema 1+ Neurological: Alert Very dysarthric- can understand 1 word sentences Follows commands.   Fair insight and awareness Motor: RUE: Shoulder abduction 2+/5, distal 3-/5--no real changes RLE: HF, KE 2/5, ADF 3/5, some improvement Skin: 2 small blisters on right knee, fluid-filled. Bandaged; healing well.   Assessment/Plan: 1. Functional deficits secondary to watershed infarct left ICA/MCA distribution which require 3+ hours per day of interdisciplinary therapy in a comprehensive inpatient rehab setting.  Physiatrist is providing close team supervision and 24 hour management of active medical problems listed below.  Physiatrist and rehab team continue to assess barriers to discharge/monitor  patient progress toward functional and medical goals  Care Tool:  Bathing  Bathing activity did not occur: Safety/medical concerns Body parts bathed by patient: Chest, Abdomen, Left upper leg, Left lower leg, Face, Right arm, Right upper leg, Right lower leg   Body parts bathed by helper: Left arm Body parts n/a: Front perineal area, Buttocks(completed earlier with nursing per pt report)   Bathing assist Assist Level: 2 Helpers     Upper Body Dressing/Undressing Upper body dressing   What is the patient wearing?: Pull over shirt    Upper body assist Assist Level: Maximal Assistance - Patient 25 - 49%    Lower Body Dressing/Undressing Lower body dressing      What is the patient wearing?: Pants     Lower body assist Assist for lower body dressing: 2 Helpers     Toileting Toileting    Toileting assist Assist for toileting: Maximal Assistance - Patient 25 - 49% Assistive Device Comment: Urinal   Transfers Chair/bed transfer  Transfers assist  Chair/bed transfer activity did not occur: Safety/medical concerns  Chair/bed transfer assist level: 2 Helpers(max A of 1 and +2 min/CGA for safety)     Locomotion Ambulation   Ambulation assist   Ambulation activity did not occur: Safety/medical concerns  Assist level: 2 helpers(mod A of 1 and +2 w/c follow) Assistive device: Walker-rolling Max distance: 46ft   Walk 10 feet activity   Assist  Walk 10 feet activity did not occur: Safety/medical concerns  Assist level: 2 helpers(mod A of  1 and +2 w/c follow) Assistive device: Walker-rolling   Walk 50 feet activity   Assist Walk 50 feet with 2 turns activity did not occur: Safety/medical concerns         Walk 150 feet activity   Assist Walk 150 feet activity did not occur: Safety/medical concerns         Walk 10 feet on uneven surface  activity   Assist Walk 10 feet on uneven surfaces activity did not occur: Safety/medical concerns          Wheelchair     Assist Will patient use wheelchair at discharge?: Yes Type of Wheelchair: Manual    Wheelchair assist level: Total Assistance - Patient < 25% Max wheelchair distance: 5'    Wheelchair 50 feet with 2 turns activity    Assist        Assist Level: Total Assistance - Patient < 25%   Wheelchair 150 feet activity     Assist     Assist Level: Total Assistance - Patient < 25%    BP 128/84 (BP Location: Left Arm)   Pulse 87   Temp 97.8 F (36.6 C)   Resp 17   Ht 6' (1.829 m)   Wt (!) 147.6 kg   SpO2 96%   BMI 44.13 kg/m    Medical Problem List and Plan: 1.  Increased right side hemiparesis with dysarthria secondary to watershed infarct left ICA/MCA distribution  Continue CIR PT, OT, SLP, min-mod goals.   Team conference today. Walked 30 feet with walker; requires wheelchair follow.  2.  Antithrombotics: -DVT/anticoagulation: Subcutaneous heparin             -antiplatelet therapy: Plavix 75 mg daily 3. Pain Management: Tylenol as needed  4/7: has been icing right knee, pain better controlled.   4. Mood: Team support             Antipsychotic agents: N/A 5. Neuropsych: This patient is capable of making decisions on his own behalf. 6. Skin/Wound Care: Routine skin checks 7. Fluids/Electrolytes/Nutrition: Routine in and outs.   8.  CAD with stenting.  Continue Plavix. 9.  Hypertension.  Norvasc 10 mg daily, Coreg 6.25 mg twice daily, hydralazine 25 mg 3 times daily.    4/7- BP well controlled, 128/84 this morning.              Monitor with increased mobility 10.  Hyperlipidemia.  Continue Lipitor 11.  AKI on CKD.  Creatinine baselin 1.60.               Creatinine 1.67 on 4/2, 4/6- improved.   Encourage fluids 12.  Morbid Obesity.  BMI 45.60.  Dietary follow-up. Encourage weight loss 13. Sleep disturbance             Remeron 7.5 mg nightly, melatonin 9 mg nightly  4/5- sleeping well- con't regimen 14. Anemia: 4/6: hgb decreased from 13.1 to  12.1. Trend tomorrow.   4/7: Further decrease to 11.8. Continue to trend.   LOS: 7 days A FACE TO FACE EVALUATION WAS PERFORMED  Hailyn Zarr P Con Arganbright 03/23/2020, 8:05 AM

## 2020-03-23 NOTE — Progress Notes (Signed)
Social Work Patient ID: Zachary Mccann, male   DOB: 09-Jul-1971, 49 y.o.   MRN: [id number]   SW gave went in to give patient team conference updates. Called significant other Zachary Mccann. Gave pt and sig other updates from team. Sig other agreed to care giver training on Tuesday, April 13th 1-4 PM with a d/c date of April 22nd. Patient is ready to discharge sooner, MD reports he is not ready clinically. SW will  Update patient of any changes.

## 2020-03-23 NOTE — Patient Care Conference (Signed)
Inpatient RehabilitationTeam Conference and Plan of Care Update Date: 03/23/2020   Time: 11:05 AM    Patient Name: Zachary Mccann      Medical Record Number: [id number]  Date of Birth: 03-08-1971 Sex: Male         Room/Bed: 4M05C/4M05C-01 Payor Info: Payor: MEDICAID [place] / Plan: MEDICAID [place] / Product Type: *No Product type* /    Admit Date/Time:  03/16/2020  7:01 PM  Primary Diagnosis:  Cerebral infarction, watershed distribution, unilateral, chronic  Patient Active Problem List   Diagnosis Date Noted  . Expressive language impairment   . Benign essential HTN   . Acute bilat watershed infarction [facility]) 03/16/2020  . Cerebral infarction, watershed distribution, unilateral, chronic 03/16/2020  . Acute renal failure (HCC)   . History of CVA in adulthood   . Dyslipidemia   . CVA (cerebral vascular accident) (HCC) 03/11/2020  . Leukocytosis   . Hemiparesis affecting dominant side as late effect of stroke (HCC)   . Labile blood pressure   . Sleep disturbance   . Benign hypertensive heart and kidney disease with diastolic CHF, NYHA class II and CKD stage III (HCC)   . Chronic systolic congestive heart failure (HCC)   . Morbid obesity (HCC)   . Uncontrolled hypertension   . Dysphagia, post-stroke   . Cardiomegaly   . Acute systolic congestive heart failure (HCC)   . 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 Cerebral embolism with cerebral infarction 07/11/2019  . Acute CVA (cerebrovascular accident) (HCC) 07/11/2019  . Slurred speech 07/10/2019  . Cardiomyopathy (HCC) 11/20/2018  . History of stroke 09/06/2018  . Tobacco use 09/06/2018  . History of non-ST elevation myocardial infarction (NSTEMI) 05/05/2018  . Severe Vitamin D deficiency 04/02/2018  . Community acquired pneumonia of left lower lobe of lung 04/02/2018  . CKD (chronic kidney disease), stage III (HCC) = Secondary to Hypertension 04/02/2018  . Pneumonia 03/30/2018  . Metabolic acidosis 03/30/2018  . AKI  (acute kidney injury) (HCC) 03/30/2018  . N&V (nausea and vomiting) 03/30/2018  . Essential hypertension 03/30/2018    Expected Discharge Date: Expected Discharge Date: 04/07/20  Team Members Present: Physician leading conference: Dr. Sula Soda Care Coodinator Present: Roderic Palau, RN, MSN;Deborah Cedric Fishman, RN, BSN, CRRN Nurse Present: Rosiland Oz, RN PT Present: Mindi Curling Shagen, PT;Carr Freida Busman, PT OT Present: Primitivo Gauze, OT SLP Present: Wanda Plump, SLP PPS Coordinator present : Edson Snowball, PT     Current Status/Progress Goal Weekly Team Focus  Bowel/Bladder   Continent with incontinenet episodes of both, LBM 4/6  less incontinent episodes  monitor & assist as needed   Swallow/Nutrition/ Hydration             ADL's   Min assist for UB bathing with mod to max for UB dressing.  LB bathing is at total assist +2 (pt 40%) as well as LB dressing, max assist for stand pivot transfers with use of the RW for support to the wide 3:1.  RUE functional use is at a gross assist level for grooming and bathing tasks with min assist.  currently min to mod assist level  selfcare retraining, transfer training, balance retraining, neuromuscular re-education, pt/family education, DME education   Mobility   mod assist bed mobility, mod/max assist sit<>stands and stand pivot transfers using RW (+2 for safety), ambulating 66ft using RW with mod assist and +2 w/c follow  min assist bed mobility and transfers, mod assist car transfer, mod assist gait 98ft, supervision w/c  propulsion 119ft  R LE strengthening and NMR, transfer training, gait trianing, activity tolerance, standing balance, pt education   Communication   Max A multimodal cues for intelligibility at word level with use of compensatory strategies  Mod A for speech intelligibility at the word level (75% intelligible)  increased use of speech intelligibility strategies at the word level   Safety/Cognition/ Behavioral Observations             Pain   no c/o pain yet on shift, c/o pain to the right knee intermittently, has tylenol prn & ice pack  pain scale <4/10  assess & treat as needed   Skin   has blisters to the right knee from hot pack, cracking & dry skin to bil feet  no new areas of skin break down  assess q shift    Rehab Goals Patient on target to meet rehab goals: Yes Rehab Goals Revised: N/A *See Care Plan and progress notes for long and short-term goals.     Barriers to Discharge  Current Status/Progress Possible Resolutions Date Resolved   Nursing                  PT                    OT                  SLP                SW                Discharge Planning/Teaching Needs:  Patient plans to discharge home with significant other and her children. Pt also had family to assit with care      Team Discussion: MD blisters R knee, R knee pain/swelling, monitoring Hgb, BP controlled, not sleeping well.  RN R side weakness, BM 4/6, cont/inc, urgency, MASD perineum.  OT min/mod goals, max transfers, max LB ADLs.  PT mod/max pivot transfers, amb 37' RW +2 with w/c following.  SLP mod A goals, max A for speech intelligibility.  Has wife to assist at DC.   Revisions to Treatment Plan: N/A     Medical Summary Current Status: Frustrated that he is unable to go home next week but still requiring total A, has blisters on right knee from heating pad Weekly Focus/Goal: Continued pain management of right knee, monitoring of blisters and swelling, conitnued intensive therapies with caregiver training.  Barriers to Discharge: Medical stability;Decreased family/caregiver support   Possible Resolutions to Barriers: Caregiver training early in stay, education as to necessity of longer duration of stay then he wants, monitoring of right knee swelling/blisters   Continued Need for Acute Rehabilitation Level of Care: The patient requires daily medical management by a physician with specialized training in physical medicine  and rehabilitation for the following reasons: Direction of a multidisciplinary physical rehabilitation program to maximize functional independence : Yes Medical management of patient stability for increased activity during participation in an intensive rehabilitation regime.: Yes Analysis of laboratory values and/or radiology reports with any subsequent need for medication adjustment and/or medical intervention. : Yes   I attest that I was present, lead the team conference, and concur with the assessment and plan of the team.   Retta Diones 03/23/2020, 4:46 PM   Team conference was held via web/ teleconference due to [place] - 19

## 2020-03-23 NOTE — Plan of Care (Signed)
  Problem: Consults Goal: RH STROKE PATIENT EDUCATION Description: Patient and family will be able to verbalize understanding of stroke prevention and how to identify stroke symptoms prior to discharge. Outcome: Progressing   Problem: RH BOWEL ELIMINATION Goal: RH STG MANAGE BOWEL WITH ASSISTANCE Description: STG Manage Bowel with min Assistance. Outcome: Progressing Goal: RH STG MANAGE BOWEL W/MEDICATION W/ASSISTANCE Description: STG Manage Bowel with Medication with min Assistance. Outcome: Progressing   Problem: RH BLADDER ELIMINATION Goal: RH STG MANAGE BLADDER WITH ASSISTANCE Description: STG Manage Bladder With min Assistance Outcome: Progressing   Problem: RH SKIN INTEGRITY Goal: RH STG SKIN FREE OF INFECTION/BREAKDOWN Description: Pt will be free of skin breakdown/infection with min assist while on CIR  Outcome: Progressing Goal: RH STG MAINTAIN SKIN INTEGRITY WITH ASSISTANCE Description: STG Maintain Skin Integrity With min Assistance. Outcome: Progressing Goal: RH STG ABLE TO PERFORM INCISION/WOUND CARE W/ASSISTANCE Description: STG Able To Perform Incision/Wound Care With mod Assistance. Outcome: Progressing   Problem: RH SAFETY Goal: RH STG ADHERE TO SAFETY PRECAUTIONS W/ASSISTANCE/DEVICE Description: STG Adhere to Safety Precautions With cues/reminders Assistance/Device. Outcome: Progressing   Problem: RH KNOWLEDGE DEFICIT Goal: RH STG INCREASE KNOWLEDGE OF HYPERTENSION Description: Pt will demonstrate understanding of management for HTN with diet regimen and medication compliance with min assist using handouts/booklets prior to DC  Outcome: Progressing Goal: RH STG INCREASE KNOWLEGDE OF HYPERLIPIDEMIA Description: Pt will demonstrate understanding of management for HLD with diet regimen and medication compliance with min assist using handouts/booklets prior to DC  Outcome: Progressing Goal: RH STG INCREASE KNOWLEDGE OF STROKE PROPHYLAXIS Description: Pt will  demonstrate understanding of management for stroke prevention with diet regimen and medication compliance with min assist using handouts/booklets prior to DC  Outcome: Progressing   

## 2020-03-23 NOTE — Progress Notes (Signed)
Speech Language Pathology Daily Session Note  Patient Details  Name: Zachary Mccann MRN: [id number] Date of Birth: 1971-01-25  Today's Date: 03/23/2020 SLP Individual Time: 0900-0940 SLP Individual Time Calculation (min): 40 min  Short Term Goals: Week 1: SLP Short Term Goal 1 (Week 1): With Total A pt will correctly voice appropriate phonemes and their cognate pair. SLP Short Term Goal 2 (Week 1): with Max A cues, pt will utilize speech intelligibility strategies to produce basic consonant vowel combinations in 5 out of 10 opportunities.  Skilled Therapeutic Interventions: Skilled treatment session focused on communication goals. Upon arrival, patient was in the bed and reported fatigue. He as agreeable to participate in treatment session and requested to get out of bed. SLP provided assistance to scoot EOB and +2 assist was needed with the [facility] for transfer to the chair. Patient asked about conference and discharge date. SLP explained the conference hadn't happened yet but that the ELOS was ~2.5-3 weeks. Patient became verbally frustrated/agitated. SLP attempted to provide education on why he would need to stay in CIR to help reduce burden of care and improve overall safety at home. Patient reported comprehension but continued to report he was not going to stay on CIR past next week. Patient also reported discomfort with current bed, SLP offered another bed option but patient declined. Throughout conversation, patient expressed his wants/needs at the word and phrase level with ~50% intelligibility, with increased errors/decreaed use of speech strategies when frustrated. Session ended 20 minutes early due to frustration/agitation. Patient left upright in wheelchair with alarm on and all needs within reach. Continue with current plan of care.      Pain Pain Assessment Pain Scale: Faces Pain Score: 0-No pain Pain Type: Chronic pain Pain Location: Knee Pain Orientation: Right Pain Descriptors /  Indicators: Discomfort Pain Onset: With Activity Pain Intervention(s): Repositioned  Therapy/Group: Individual Therapy  Elih Mooney 03/23/2020, 3:02 PM

## 2020-03-23 NOTE — Progress Notes (Signed)
Occupational Therapy Session Note  Patient Details  Name: Zachary Mccann MRN: [id number] Date of Birth: 08-Mar-1971  Today's Date: 03/23/2020 OT Individual Time: 1101-1200 OT Individual Time Calculation (min): 59 min    Short Term Goals: Week 1:  OT Short Term Goal 1 (Week 1): Pt will be able to push from L sidelying to sit with mod A to prep for transfer. OT Short Term Goal 2 (Week 1): Pt will be able to complete squat pivot to [facility] with max A of 1. OT Short Term Goal 3 (Week 1): Pt will be able to stand fully upright in stedy to allow caregivers to manage clothing over hips pre/post toileting. OT Short Term Goal 4 (Week 1): Pt will don shirt with min A. OT Short Term Goal 5 (Week 1): Pt will perform self ROM with mod A.  Skilled Therapeutic Interventions/Progress Updates:    Pt up in the wheelchair to start the session.  He was agitated about being in the hospital and not wanting to stay.  Therapist re-directed pt and worked on trying to make him understand that he needs to be able to transfer at a min assist level for selfcare and toilet transfers with the RW.  Also discussed the need to stay longer as to decrease the percentage of risk of fall.  Pt with some understanding.  Also discussed if he does better faster than he might not need to stay another two weeks.  Will continue to monitor.  Took pt to the dayroom where he worked on functional mobility with use of the RW for support.  He completed 2 intervals of mobility at 25' with use of the RW and max assist for sit to stand.  Once standing, he was able to ambulate with min assist, but demonstrated limited distance secondary to right knee pain.  Right knee also ace bandaged for support as well.  Next, had pt work on RUE coordination in sitting with some standing as tolerated.  He needed min facilitation for picking up and placing checkers with the RUE secondary to motor planning deficits and coordination difficulty.  Mod demonstrational cueing as  well to avoid leaning to the left when attempting to place checker in the [employer].  He was able to stand for intervals of 1-2 mins while engaged in activity as well.  Increased trunk flexion in standing with increased weightshift off of the RLE and over to the LLE noted.  Finished session with transfer back to the room and pt left sitting up in the wheelchair with call button and phone in reach.  Safety belt in place.   Therapy Documentation Precautions:  Precautions Precautions: Fall Precaution Comments: R hemi Restrictions Weight Bearing Restrictions: No  Pain: Pain Assessment Pain Scale: Faces Pain Score: 0-No pain Pain Type: Chronic pain Pain Location: Knee Pain Orientation: Right Pain Descriptors / Indicators: Discomfort Pain Onset: With Activity Pain Intervention(s): Repositioned ADL: See Care Tool Section for some details of mobility and selfcare  Therapy/Group: Individual Therapy  Shasha Buchbinder OTR/L 03/23/2020, 12:07 PM

## 2020-03-23 NOTE — Progress Notes (Signed)
Patient noted laying in bed at the beginning of the shift. He was alert & responsive, no c/o pain or discomfort. Blisters to the right lateral knee are still intact. A tegaderm was placed prior to the shift for protection. No redness noted. He has very dry, flaking skin to the bottoms of both feet. No acute distress noted. Will continue to monitor for changes.

## 2020-03-23 NOTE — Progress Notes (Signed)
Physical Therapy Session Note  Patient Details  Name: Zachary Mccann MRN: [id number] Date of Birth: 1971/03/19  Today's Date: 03/23/2020 PT Individual Time: 1335-1448 PT Individual Time Calculation (min): 73 min    Short Term Goals: Week 1:  PT Short Term Goal 1 (Week 1): Pt will be able to perform basic w/c level transfers with max assist PT Short Term Goal 2 (Week 1): Pt will be able to perform sit <> stand with max assist PT Short Term Goal 3 (Week 1): Pt will be able to perform w/c propulsion x 50'  Skilled Therapeutic Interventions/Progress Updates:    Pt seated in w/c upon PT arrival, pt on the phone upon arrival talking to his wife. Pt tells his wife "I want to go home, come and get me." Once patient hangs up therapist asks what his wife said. Pt reports his wife is coming in on Tuesday for education, pt continues to report "home today" and giving thumbs down. Therapist and therapy tech provide emotional support and education on importance of therapy for strengthening and increasing independence with mobility. Pt's brother then calls, pt talking to his brother who is also trying to encourage pt to stay for therapy. Pt agreeable to go to the gym and participate in therapy. Pt transported to the gym in w/c. Pt participated in blocked practice of stand pivot transfers with RW and min-mod assist +2 for safety, min for transfer to L, mod for transfer to the R. Sit<>stands fluctuated throoughout session based on R knee pain/weakness, from mod +1 to Max +2 for sit<>stands. Pt reports R knee pain 7/10, offered ice but pt declined, ace wrapped for compression/pain relief during standing. Pt performed blocked practice of sit<>stands 2 x 5 from the mat with cues for anterior weightshift, foot placement and scooting hips to edge of mat, pt initially max +1, fading to mod assist +1 with practice/cues. Pt worked on standing balance/tolerance without UE support, maintains L lateral weightshift while  statically standing, performed ball toss activity x 2 trials with CGA-min assist. Pt ambulated x 16 ft this session with RW and min assist +2 for w/c follow for safety, pt with decreased R LE stance time secondary to pain/weakness (likely PLOF). Pt transported outside this session for fresh air, while outside discussed PLOF and d/c planning, pt performed x 10 LAQ with R LE. Pt left in w/c in room at end of session with needs in reach and chair alarm set.   Therapy Documentation Precautions:  Precautions Precautions: Fall Precaution Comments: R hemi Restrictions Weight Bearing Restrictions: No    Therapy/Group: Individual Therapy  Cresenciano Genre, PT, DPT, CSRS 03/23/2020, 1:37 PM

## 2020-03-24 ENCOUNTER — Inpatient Hospital Stay (HOSPITAL_COMMUNITY): Payer: Medicaid Other | Admitting: Physical Therapy

## 2020-03-24 ENCOUNTER — Inpatient Hospital Stay (HOSPITAL_COMMUNITY): Payer: Medicaid Other | Admitting: Speech Pathology

## 2020-03-24 ENCOUNTER — Inpatient Hospital Stay (HOSPITAL_COMMUNITY): Payer: Medicaid Other | Admitting: Occupational Therapy

## 2020-03-24 ENCOUNTER — Inpatient Hospital Stay (HOSPITAL_COMMUNITY): Payer: Medicaid Other | Admitting: *Deleted

## 2020-03-24 LAB — CBC
HCT: 38.2 % — ABNORMAL LOW (ref 39.0–52.0)
Hemoglobin: 12.6 g/dL — ABNORMAL LOW (ref 13.0–17.0)
MCH: 31.4 pg (ref 26.0–34.0)
MCHC: 33 g/dL (ref 30.0–36.0)
MCV: 95.3 fL (ref 80.0–100.0)
Platelets: 252 10*3/uL (ref 150–400)
RBC: 4.01 MIL/uL — ABNORMAL LOW (ref 4.22–5.81)
RDW: 12.6 % (ref 11.5–15.5)
WBC: 8.2 10*3/uL (ref 4.0–10.5)
nRBC: 0 % (ref 0.0–0.2)

## 2020-03-24 MED ORDER — TRAMADOL HCL 50 MG PO TABS
50.0000 mg | ORAL_TABLET | Freq: Two times a day (BID) | ORAL | Status: DC | PRN
  Administered 2020-03-24: 12:00:00 50 mg via ORAL
  Filled 2020-03-24: qty 1

## 2020-03-24 NOTE — Progress Notes (Signed)
[name] PHYSICAL MEDICINE & REHABILITATION PROGRESS NOTE  Subjective/Complaints:  Icing R knee helps, but still bothering him a lot-  Very painful- did speak and say knee hurt, but did a lot of gesturing as well.  Said hurts esp with walking.     ROS: limited due to cognition  Objective: Vital Signs: Blood pressure (!) 127/95, pulse 77, temperature 98 F (36.7 C), resp. rate 20, height 6' (1.829 m), weight (!) 146.3 kg, SpO2 95 %. No results found. Recent Labs    03/23/20 0514 03/24/20 0441  WBC 7.4 8.2  HGB 11.8* 12.6*  HCT 35.8* 38.2*  PLT 250 252   Recent Labs    03/22/20 0508  NA 136  K 4.5  CL 104  CO2 22  GLUCOSE 94  BUN 25*  CREATININE 1.59*  CALCIUM 9.4    Physical Exam: BP (!) 127/95   Pulse 77   Temp 98 F (36.7 C)   Resp 20   Ht 6' (1.829 m)   Wt (!) 146.3 kg   SpO2 95%   BMI 43.74 kg/m  Constitutional: sitting up in bed; initially asleep, but woke easily, NAD HEENT: EOMI intact; moist oral mucosa Neck: supple Cardiovascular: RRR- no JVD   Respiratory/Chest: CTA B/L- good air movement  GI/Abdomen: soft, NT;  ND, (+)BS Ext: no clubbing, cyanosis, or edema Psych: pleasant; appropriate Musc: Lower extremity edema 1+ Neurological: Alert Very dysarthric Follows commands.   Fair insight and awareness Motor: RUE: Shoulder abduction 2+/5, distal 3-/5--no real changes RLE: HF, KE 2/5, ADF 3/5, some improvement Skin: 2 small blisters on right knee, fluid-filled. Bandaged; healing well.  Also on palpation, has an effusion of R knee- TTP    Assessment/Plan: 1. Functional deficits secondary to watershed infarct left ICA/MCA distribution which require 3+ hours per day of interdisciplinary therapy in a comprehensive inpatient rehab setting.  Physiatrist is providing close team supervision and 24 hour management of active medical problems listed below.  Physiatrist and rehab team continue to assess barriers to discharge/monitor patient progress  toward functional and medical goals  Care Tool:  Bathing  Bathing activity did not occur: Safety/medical concerns Body parts bathed by patient: Chest, Abdomen, Left upper leg, Left lower leg, Face, Right arm, Right upper leg, Right lower leg   Body parts bathed by helper: Left arm Body parts n/a: Front perineal area, Buttocks(completed earlier with nursing per pt report)   Bathing assist Assist Level: 2 Helpers     Upper Body Dressing/Undressing Upper body dressing   What is the patient wearing?: Pull over shirt    Upper body assist Assist Level: Maximal Assistance - Patient 25 - 49%    Lower Body Dressing/Undressing Lower body dressing      What is the patient wearing?: Pants     Lower body assist Assist for lower body dressing: 2 Helpers     Toileting Toileting    Toileting assist Assist for toileting: Maximal Assistance - Patient 25 - 49% Assistive Device Comment: Urinal   Transfers Chair/bed transfer  Transfers assist  Chair/bed transfer activity did not occur: Safety/medical concerns  Chair/bed transfer assist level: Moderate Assistance - Patient 50 - 74%     Locomotion Ambulation   Ambulation assist   Ambulation activity did not occur: Safety/medical concerns  Assist level: 2 helpers Assistive device: Walker-rolling Max distance: 16 ft   Walk 10 feet activity   Assist  Walk 10 feet activity did not occur: Safety/medical concerns  Assist level: 2 helpers Assistive  device: Walker-rolling   Walk 50 feet activity   Assist Walk 50 feet with 2 turns activity did not occur: Safety/medical concerns         Walk 150 feet activity   Assist Walk 150 feet activity did not occur: Safety/medical concerns         Walk 10 feet on uneven surface  activity   Assist Walk 10 feet on uneven surfaces activity did not occur: Safety/medical concerns         Wheelchair     Assist Will patient use wheelchair at discharge?: Yes Type of  Wheelchair: Manual    Wheelchair assist level: Total Assistance - Patient < 25% Max wheelchair distance: 5'    Wheelchair 50 feet with 2 turns activity    Assist        Assist Level: Total Assistance - Patient < 25%   Wheelchair 150 feet activity     Assist     Assist Level: Total Assistance - Patient < 25%    BP (!) 127/95   Pulse 77   Temp 98 F (36.7 C)   Resp 20   Ht 6' (1.829 m)   Wt (!) 146.3 kg   SpO2 95%   BMI 43.74 kg/m    Medical Problem List and Plan: 1.  Increased right side hemiparesis with dysarthria secondary to watershed infarct left ICA/MCA distribution  Continue CIR PT, OT, SLP, min-mod goals.   Team conference today. Walked 30 feet with walker; requires wheelchair follow.  2.  Antithrombotics: -DVT/anticoagulation: Subcutaneous heparin             -antiplatelet therapy: Plavix 75 mg daily 3. Pain Management: Tylenol as needed  4/7: has been icing right knee, pain better controlled.   4/8- will try Tramadol since also has effusion palpated- 50 mg q12 hours prn-  4. Mood: Team support             Antipsychotic agents: N/A 5. Neuropsych: This patient is capable of making decisions on his own behalf. 6. Skin/Wound Care: Routine skin checks 7. Fluids/Electrolytes/Nutrition: Routine in and outs.   8.  CAD with stenting.  Continue Plavix. 9.  Hypertension.  Norvasc 10 mg daily, Coreg 6.25 mg twice daily, hydralazine 25 mg 3 times daily.    4/8- BP 120s/90s- this AM- overall controlled             Monitor with increased mobility 10.  Hyperlipidemia.  Continue Lipitor 11.  AKI on CKD.  Creatinine baselin 1.60.               Creatinine 1.67 on 4/2, 4/6- improved.   Encourage fluids 12.  Morbid Obesity.  BMI 45.60.  Dietary follow-up. Encourage weight loss 13. Sleep disturbance             Remeron 7.5 mg nightly, melatonin 9 mg nightly  4/5- sleeping well- con't regimen 14. Anemia: 4/6: hgb decreased from 13.1 to 12.1. Trend tomorrow.   4/7:  Further decrease to 11.8. Continue to trend.  4/8- Hb back up to 12.6- doing better     LOS: 8 days A FACE TO FACE EVALUATION WAS PERFORMED  Zachary Mccann 03/24/2020, 10:41 AM

## 2020-03-24 NOTE — Progress Notes (Signed)
Physical Therapy Weekly Progress Note  Patient Details  Name: Zachary Mccann MRN: [id number] Date of Birth: [birth year]  Beginning of progress report period: March 17, 2020 End of progress report period: March 24, 2020  Today's Date: 03/24/2020 PT Individual Time: 0488-8916 PT Individual Time Calculation (min): 68 min   Patient has met 2 of 3 short term goals. [name] is progressing well with therapy demonstrating improving activity tolerance and independence with functional mobility. He is performing bed mobility with mod assist (occasional max assist), sit<>stand transfers using RW with mod/max assist of 1 (+2 for safety), stand pivot transfers with mod/max to come to stand and mod assist for balance while turning (+2 for safety), and ambulating up to 35f using RW with mod assist of 1 and +2 w/c follow. The past few days he has been limited in progression with standing/ambulating tasks due to R knee pain - MD aware and adjusting medications - pt declines ice during therapy sessions.  Patient continues to demonstrate the following deficits muscle weakness and muscle joint tightness, decreased cardiorespiratoy endurance, impaired timing and sequencing, unbalanced muscle activation, decreased coordination and decreased motor planning, decreased problem solving and decreased safety awareness and decreased standing balance, decreased postural control and decreased balance strategies and therefore will continue to benefit from skilled PT intervention to increase functional independence with mobility.  Patient progressing toward long term goals..  Continue plan of care.  PT Short Term Goals Week 1:  PT Short Term Goal 1 (Week 1): Pt will be able to perform basic w/c level transfers with max assist PT Short Term Goal 1 - Progress (Week 1): Met PT Short Term Goal 2 (Week 1): Pt will be able to perform sit <> stand with max assist PT Short Term Goal 2 - Progress (Week 1): Met PT Short Term Goal 3  (Week 1): Pt will be able to perform w/c propulsion x 50' PT Short Term Goal 3 - Progress (Week 1): Not met Week 2:  PT Short Term Goal 1 (Week 2): Pt will perform supine<>sit with mod assist consistently PT Short Term Goal 2 (Week 2): Pt wil perform sit<>stand with mod assist consistently PT Short Term Goal 3 (Week 2): Pt will perform stand pivot transfer using LRAD with mod assist consistently PT Short Term Goal 4 (Week 2): Pt will ambulate at least 125fusing RW with mod assist of 1 and +2 w/c follow if needed  Skilled Therapeutic Interventions/Progress Updates:  Ambulation/gait training;Balance/vestibular training;Cognitive remediation/compensation;Community reintegration;Discharge planning;Disease management/prevention;DME/adaptive equipment instruction;Functional mobility training;Functional electrical stimulation;Neuromuscular re-education;Pain management;Patient/family education;Psychosocial support;Skin care/wound management;Splinting/orthotics;Stair training;Therapeutic Activities;Therapeutic Exercise;UE/LE Strength taining/ROM;UE/LE Coordination activities;Wheelchair propulsion/positioning;Visual/perceptual remediation/compensation   Pt received supine in bed and agreeable to therapy session. Pt reports R knee pain and having premedicated with tylenol but states it does not help - Dr. LoDagoberto Ligasotified and reports added another medication - pt made aware and requested at end of session. Attempted R knee grade 1 joint mobilizations while in supine for pain management but pt reports this slightly increased the pain therefore deferred continuing. Supine<>sit with mod assist and max multimodal cuing for sequencing to increase pt independence. Seated EOB pt continues to demonstrate poor R quad muscle activation with significant strength deficits therefore applied NMES via "large muscle" group on "atrophy" setting for 10 minutes while participating in long arc quads with active assist for increased ROM  - unable to get visible quad muscle contraction using NMES. Sitting EOB donned pants max assist for time management. Sit>stand EOB>RW with mod  assist for lifting into standing primarily for increased anterior weight shift - pt demonstrates locking bilateral knees into extension with delayed hip extension when coming to stand - max cuing for improved technique/form. Standing with RW and min assist therapist pulled pants over hips max/total assist . Pt initiated ambulation ~15f to sink but unable to tolerate R LE weightbearing due to R knee pain therefore unable to ambulate at this time - returned to sitting EOB. Repeated sit<>stands x6 using RW with mod assist for lifting/lowering and facilitating anterior weight shift - pt continues to demonstrate poor motor planning as described above anticipate this is likely due to R knee pain and significantly impaired R quad muscle strength. R stand pivot EOB>w/c using RW with mod assist for coming to stand then min/mod assist for balance while turning - max cuing for sequencing AD and LE stepping - pt continues to demonstrate antalgic pattern with limited R LE weightbearing tolerance.  Transported to/from gym in w/c for time management and energy conservation. Block practice stand pivot transfers w/c<>EOM using RW as described above with pt education on importance of increasing independence with this task prior to D/C home so his wife can safely assist him. Transported back to room and pt left seated in w/c with needs in reach and seat belt alarm on.  Therapy Documentation Precautions:  Precautions Precautions: Fall Precaution Comments: R hemi Restrictions Weight Bearing Restrictions: No   Pain:   R knee pain - details above - Dr. LDagoberto Ligasnotified  Therapy/Group: Individual Therapy  CTawana Scale PT, DPT 03/24/2020, 8:34 AM

## 2020-03-24 NOTE — Progress Notes (Signed)
Speech Language Pathology Weekly Progress and Session Note  Patient Details  Name: Zachary Mccann MRN: [id number] Date of Birth: 08/16/1971  Beginning of progress report period: March 17, 2020 End of progress report period: March 24, 2020  Today's Date: 03/24/2020 SLP Individual Time: 0830-0930 SLP Individual Time Calculation (min): 60 min  Short Term Goals: Week 1: SLP Short Term Goal 1 (Week 1): With Total A pt will correctly voice appropriate phonemes and their cognate pair. SLP Short Term Goal 1 - Progress (Week 1): Met SLP Short Term Goal 2 (Week 1): with Max A cues, pt will utilize speech intelligibility strategies to produce basic consonant vowel combinations in 5 out of 10 opportunities. SLP Short Term Goal 2 - Progress (Week 1): Met    New Short Term Goals: Week 2: SLP Short Term Goal 1 (Week 2): STGs = LTGs d/t short ELOS  Weekly Progress Updates:  Pt has made steady gains during this reporting period and as a result he has met all of his current STGs. Pt continues to have reduced speech intelligibility at the word level with Max to Moderate cues to use speech intelligibility strategies. Pt's ability to verbally express his wants/needs continues to be negatively impacted. As such, skilled ST continues to be required to improve speech intelligibility at the word level.      Intensity: Minumum of 1-2 x/day, 30 to 90 minutes Frequency: 3 to 5 out of 7 days Duration/Length of Stay: 4/22 Treatment/Interventions: Speech/Language facilitation;Patient/family education   Daily Session  Skilled Therapeutic Interventions: Skilled treatment session targeted pt's speech intelligibility goals as well as extensive education on need for intensive therapy to improve speech intelligibility as well as physical deficits. With Max A cues pt able to imitate simple CVC words that have /p/ in final position. Although pt demosntrated understanding of need to continue intensive therapy, he continued  to say that he was discharging next Friday (April 16).      General    Pain    Therapy/Group: Individual Therapy  Jaleyah Longhi 03/24/2020, 9:03 AM

## 2020-03-24 NOTE — Progress Notes (Signed)
Occupational Therapy Weekly Progress Note  Patient Details  Name: RAYSHAD RIVIELLO MRN: [id number] Date of Birth: [birth year]  Beginning of progress report period: March 17, 2020 End of progress report period: March 24, 2020  Today's Date: 03/24/2020 OT Individual Time: 2751-7001 OT Individual Time Calculation (min): 69 min    Patient has met 3 of 5 short term goals.  Mr. Rudd is making steady progress with OT at this time.  He continues to need max assist for LB selfcare sit to stand as well as for functional transfers.  He also continues to need min assist for UB bathing with max assist for UB dressing to donn a pullover shirt following hemi dressing techniques.  Mr. Siler currently needs min to mod assist for integration of the RUE into functional tasks and demonstrates some motor planning deficits secondary to the inconsistency noted with grasp and functional reach.  Pt is eager to get stronger and return home.  Current discharge is set for 4/22, but may be sooner if pt can progress to a manageable level with transfers and selfcare tasks.  Recommend continued CIR level therapy at this time to progress to overall min assist level goals.   Patient continues to demonstrate the following deficits: muscle weakness, impaired timing and sequencing, unbalanced muscle activation, motor apraxia, decreased coordination and decreased motor planning and decreased sitting balance, decreased standing balance, hemiplegia and decreased balance strategies and therefore will continue to benefit from skilled OT intervention to enhance overall performance with BADL and Reduce care partner burden.  Patient progressing toward long term goals..  Continue plan of care.  OT Short Term Goals Week 2:  OT Short Term Goal 1 (Week 2): Pt will complete supine to sit with HOB elevated and overall mod assist. OT Short Term Goal 2 (Week 2): Pt will complete UB dressing with min assist following hemi dressing techniques. OT  Short Term Goal 3 (Week 2): Pt will complete LB dressing with mod assist sit to stand. OT Short Term Goal 4 (Week 2): Pt will perform stand pivot transfer to the 3:1 with min assist.  Skilled Therapeutic Interventions/Progress Updates:    Pt completed sit to stand transitions in the dayroom with overall mod to max assist from the wheelchair.  RW was used for support in standing with pt needing mod assist at times while engaged in Wii activity.  He was able to tolerate standing for 2-3 mins before needing to sit and rest.  He continues to exhibit increased trunk flexion in standing with decreased ability to maintain active hip extension.  Noted pt with bowel incontinence in his brief while standing.  Returned to the room to work on cleaning up his buttocks and donning new brief sit to stand at the sink.  He was able to complete sit to stand with mod assist from the wheelchair but needed total assist for washing his buttocks thoroughly.  He was able to wash his front peri area with mod assist in standing.  Total assist was then needed for donning a new brief and pulling it over his hips.  He was left in the wheelchair at the end of the session with the call button and phone in reach and safety belt in place.    Therapy Documentation Precautions:  Precautions Precautions: Fall Precaution Comments: R hemi Restrictions Weight Bearing Restrictions: No  Pain: Pain Assessment Pain Scale: Faces Pain Score: 0-No pain ADL: See Care Tool Section for some details of mobility and selfcare  Therapy/Group: Individual  Therapy  Acelyn Basham OTR/L 03/24/2020, 4:26 PM

## 2020-03-24 NOTE — Evaluation (Signed)
Recreational Therapy Assessment and Plan  Patient Details  Name: Zachary Mccann MRN: [id number] Date of Birth: 1971-12-16 Today's Date: 03/24/2020  Rehab Potential: Good ELOS: d/c 4/22  Assessment Problem List:      Patient Active Problem List   Diagnosis Date Noted  . Acute bilat watershed infarction [facility]) 03/16/2020  . Cerebral infarction, watershed distribution, unilateral, chronic 03/16/2020  . Acute renal failure ([place])   . History of CVA in adulthood   . Dyslipidemia   . CVA (cerebral vascular accident) ([place]) 03/11/2020  . Leukocytosis   . Hemiparesis affecting dominant side as late effect of stroke ([place])   . Labile blood pressure   . Sleep disturbance   . Benign hypertensive heart and kidney disease with diastolic CHF, NYHA class II and CKD stage III ([place])   . Chronic systolic congestive heart failure ([place])   . Morbid obesity ([place])   . Uncontrolled hypertension   . Dysphagia, post-stroke   . Cardiomegaly   . Acute systolic congestive heart failure ([place])   . Left middle cerebral artery stroke ([place]) 07/17/2019  . Cerebral embolism with cerebral infarction 07/11/2019  . Acute CVA (cerebrovascular accident) ([place]) 07/11/2019  . Slurred speech 07/10/2019  . Cardiomyopathy ([place]) 11/20/2018  . History of stroke 09/06/2018  . Tobacco use 09/06/2018  . History of non-ST elevation myocardial infarction (NSTEMI) 05/05/2018  . Severe Vitamin D deficiency 04/02/2018  . Community acquired pneumonia of left lower lobe of lung 04/02/2018  . CKD (chronic kidney disease), stage III ([place]) = Secondary to Hypertension 04/02/2018  . Pneumonia 03/30/2018  . Metabolic acidosis 58/52/7782  . AKI (acute kidney injury) ([place]) 03/30/2018  . N&V (nausea and vomiting) 03/30/2018  . Essential hypertension 03/30/2018    Past Medical History:      Past Medical History:  Diagnosis Date  . CAD (coronary artery disease)    a. s/p NSTEMI in 04/2018 with DES to LCx.   [name]  Hypertension   . Myocardial infarction ([place])   . Pneumonia   . Stroke [facility])    Past Surgical History:  Past Surgical History:  Procedure Laterality Date  . CORONARY STENT INTERVENTION N/A 05/05/2018   Procedure: CORONARY STENT INTERVENTION;  Surgeon: Leonie Man, MD;  Location: [facility];  Service: Cardiovascular;  Laterality: N/A;  . LEFT HEART CATH AND CORONARY ANGIOGRAPHY N/A 05/05/2018   Procedure: LEFT HEART CATH AND CORONARY ANGIOGRAPHY;  Surgeon: Leonie Man, MD;  Location: [facility];  Service: Cardiovascular;  Laterality: N/A;  . TEE WITHOUT CARDIOVERSION N/A 09/08/2018   Procedure: TRANSESOPHAGEAL ECHOCARDIOGRAM (TEE);  Surgeon: Larey Dresser, MD;  Location: [facility] ENDOSCOPY;  Service: Cardiovascular;  Laterality: N/A;    Assessment & Plan Clinical Impression: Patient is a 49 year old right-handed male with history of hypertension and tobacco abuse, CAD status post non-STEMI 04/2018 as well as left ICA occlusion in July 2020 with 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 History taken from chart review due to dysarthria. Patient lives with his girlfriend. Two-level home bed and bath on main level with ramped entrance. Girlfriend did assist with some ADLs. He do have 6 children in the home and for reportedly handicapped. He presented on 03/11/20 with increasing right hemiparesis. MRI brain personally reviewed, unremarkable for acute intracranial process. Per report, positive for progressive deep watershed ischemia and development of left side Wallerian degeneration since July  of last year. Admission chemistries with creatinine 2.41 with baseline 1.6, TSH 5.449, SARS coronavirus negative. Neurology follow-up currently maintained on Plavix for CVA prophylaxis. Subcutaneous heparin for DVT prophylaxis.  Maintained on a regular diet. Renal function stabilized with gentle IV fluids latest creatinine 1.47. Therapy evaluations completed and patient was admitted for a comprehensive rehab program. Patient transferred to CIR on 03/16/2020.    Pt presents with decreased activity tolerance, decreased functional mobility, decreased balance Limiting pt's independence with leisure/community pursuits.  Plan Min 1 TR session >20 minutes per week during LOS  Recommendations for other services: Neuropsych  Discharge Criteria: Patient will be discharged from TR if patient refuses treatment 3 consecutive times without medical reason.  If treatment goals not met, if there is a change in medical status, if patient makes no progress towards goals or if patient is discharged from hospital.  The above assessment, treatment plan, treatment alternatives and goals were discussed and mutually agreed upon: by patient  [place] 03/24/2020, 3:02 PM

## 2020-03-24 NOTE — Plan of Care (Signed)
  Problem: Consults Goal: RH STROKE PATIENT EDUCATION Description: Patient and family will be able to verbalize understanding of stroke prevention and how to identify stroke symptoms prior to discharge. Outcome: Progressing   Problem: RH BOWEL ELIMINATION Goal: RH STG MANAGE BOWEL WITH ASSISTANCE Description: STG Manage Bowel with min Assistance. Outcome: Progressing Goal: RH STG MANAGE BOWEL W/MEDICATION W/ASSISTANCE Description: STG Manage Bowel with Medication with min Assistance. Outcome: Progressing   Problem: RH BLADDER ELIMINATION Goal: RH STG MANAGE BLADDER WITH ASSISTANCE Description: STG Manage Bladder With min Assistance Outcome: Progressing   Problem: RH SKIN INTEGRITY Goal: RH STG SKIN FREE OF INFECTION/BREAKDOWN Description: Pt will be free of skin breakdown/infection with min assist while on CIR  Outcome: Progressing Goal: RH STG MAINTAIN SKIN INTEGRITY WITH ASSISTANCE Description: STG Maintain Skin Integrity With min Assistance. Outcome: Progressing Goal: RH STG ABLE TO PERFORM INCISION/WOUND CARE W/ASSISTANCE Description: STG Able To Perform Incision/Wound Care With mod Assistance. Outcome: Progressing   Problem: RH SAFETY Goal: RH STG ADHERE TO SAFETY PRECAUTIONS W/ASSISTANCE/DEVICE Description: STG Adhere to Safety Precautions With cues/reminders Assistance/Device. Outcome: Progressing   Problem: RH KNOWLEDGE DEFICIT Goal: RH STG INCREASE KNOWLEDGE OF HYPERTENSION Description: Pt will demonstrate understanding of management for HTN with diet regimen and medication compliance with min assist using handouts/booklets prior to DC  Outcome: Progressing Goal: RH STG INCREASE KNOWLEGDE OF HYPERLIPIDEMIA Description: Pt will demonstrate understanding of management for HLD with diet regimen and medication compliance with min assist using handouts/booklets prior to DC  Outcome: Progressing Goal: RH STG INCREASE KNOWLEDGE OF STROKE PROPHYLAXIS Description: Pt will  demonstrate understanding of management for stroke prevention with diet regimen and medication compliance with min assist using handouts/booklets prior to DC  Outcome: Progressing   

## 2020-03-25 ENCOUNTER — Inpatient Hospital Stay (HOSPITAL_COMMUNITY): Payer: Medicaid Other | Admitting: Occupational Therapy

## 2020-03-25 ENCOUNTER — Inpatient Hospital Stay (HOSPITAL_COMMUNITY): Payer: Medicaid Other | Admitting: Speech Pathology

## 2020-03-25 ENCOUNTER — Inpatient Hospital Stay (HOSPITAL_COMMUNITY): Payer: Medicaid Other | Admitting: Physical Therapy

## 2020-03-25 MED ORDER — TRAMADOL HCL 50 MG PO TABS
50.0000 mg | ORAL_TABLET | Freq: Three times a day (TID) | ORAL | Status: DC | PRN
  Administered 2020-03-25 – 2020-03-27 (×3): 50 mg via ORAL
  Filled 2020-03-25 (×3): qty 1

## 2020-03-25 NOTE — Progress Notes (Signed)
Speech Language Pathology Daily Session Note  Patient Details  Name: Zachary Mccann MRN: [id number] Date of Birth: [birth year]  Today's Date: 03/25/2020 SLP Individual Time: 0730-0825 SLP Individual Time Calculation (min): 55 min  Short Term Goals: Week 2: SLP Short Term Goal 1 (Week 2): STGs = LTGs d/t short ELOS  Skilled Therapeutic Interventions:  Skilled treatment session targeted pt's speech intelligibility goals. SLP facilitiated session by providing opportunities for pt to communicate functional wants/needs at the word to simple phrase level. Opportunities included repositioning himself in bed, assisting with fixing his brief and directions to reposition his left leg. With Mod A verbal cues, pt was ~ 75% intelligible. 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 Pt left upright in bed, bed alarm on and all needs within reach. Continue per current plan of care.      Pain Pain Assessment Pain Scale: 0-10 Pain Score: 7  Pain Type: Acute pain Pain Location: Knee Pain Orientation: Right Pain Descriptors / Indicators: Aching Pain Onset: With Activity Patients Stated Pain Goal: 3 Pain Intervention(s): Medication (See eMAR)  Therapy/Group: Individual Therapy  Channon Brougher 03/25/2020, 12:19 PM

## 2020-03-25 NOTE — Progress Notes (Signed)
[place] PHYSICAL MEDICINE & REHABILITATION PROGRESS NOTE  Subjective/Complaints:  Pt reports R knee still a large issue- however tramadol "somewhat" helpful- discussed increasing dose to q8 hours prn- but explained pt needs to ask for it- pt agreeable.    ROS: limited due to speech/congition Objective: Vital Signs: Blood pressure 121/78, pulse 86, temperature 97.9 F (36.6 C), resp. rate 16, height 6' (1.829 m), weight (!) 146.9 kg, SpO2 97 %. No results found. Recent Labs    03/23/20 0514 03/24/20 0441  WBC 7.4 8.2  HGB 11.8* 12.6*  HCT 35.8* 38.2*  PLT 250 252   No results for input(s): NA, K, CL, CO2, GLUCOSE, BUN, CREATININE, CALCIUM in the last 72 hours.  Physical Exam: BP 121/78 (BP Location: Left Arm)   Pulse 86   Temp 97.9 F (36.6 C)   Resp 16   Ht 6' (1.829 m)   Wt (!) 146.9 kg   SpO2 97%   BMI 43.92 kg/m  Constitutional: sitting up in bed; SLP at bedside- pt eating breakfast, NAD HEENT: conjugate gaze Neck: supple Cardiovascular: RRR_ no JVD   Respiratory/Chest: good air movement; no resp distress-  GI/Abdomen:soft, nondistended;  Ext: no clubbing, cyanosis, or edema Psych: pleasant; appropriate Musc: Lower extremity edema 1+ Neurological: Alert Very dysarthric Follows commands.   Fair insight and awareness Motor: RUE: Shoulder abduction 2+/5, distal 3-/5--no real changes RLE: HF, KE 2/5, ADF 3/5, some improvement Skin: 2 small blisters on right knee, fluid-filled. Bandaged; healing well.  R knee slightly swollen; also has effusion- TTP over lateral distal aspect of patella.    Assessment/Plan: 1. Functional deficits secondary to watershed infarct left ICA/MCA distribution which require 3+ hours per day of interdisciplinary therapy in a comprehensive inpatient rehab setting.  Physiatrist is providing close team supervision and 24 hour management of active medical problems listed below.  Physiatrist and rehab team continue to assess barriers to  discharge/monitor patient progress toward functional and medical goals  Care Tool:  Bathing  Bathing activity did not occur: Safety/medical concerns Body parts bathed by patient: Chest, Abdomen, Left upper leg, Left lower leg, Face, Right arm, Right upper leg, Right lower leg   Body parts bathed by helper: Left arm Body parts n/a: Front perineal area, Buttocks(completed earlier with nursing per pt report)   Bathing assist Assist Level: 2 Helpers     Upper Body Dressing/Undressing Upper body dressing   What is the patient wearing?: Pull over shirt    Upper body assist Assist Level: Maximal Assistance - Patient 25 - 49%    Lower Body Dressing/Undressing Lower body dressing      What is the patient wearing?: Pants     Lower body assist Assist for lower body dressing: 2 Helpers     Toileting Toileting    Toileting assist Assist for toileting: Maximal Assistance - Patient 25 - 49% Assistive Device Comment: Urinal   Transfers Chair/bed transfer  Transfers assist  Chair/bed transfer activity did not occur: Safety/medical concerns  Chair/bed transfer assist level: Moderate Assistance - Patient 50 - 74%     Locomotion Ambulation   Ambulation assist   Ambulation activity did not occur: Safety/medical concerns  Assist level: 2 helpers Assistive device: Walker-rolling Max distance: 16 ft   Walk 10 feet activity   Assist  Walk 10 feet activity did not occur: Safety/medical concerns  Assist level: 2 helpers Assistive device: Walker-rolling   Walk 50 feet activity   Assist Walk 50 feet with 2 turns activity did not  occur: Safety/medical concerns         Walk 150 feet activity   Assist Walk 150 feet activity did not occur: Safety/medical concerns         Walk 10 feet on uneven surface  activity   Assist Walk 10 feet on uneven surfaces activity did not occur: Safety/medical concerns         Wheelchair     Assist Will patient use  wheelchair at discharge?: Yes Type of Wheelchair: Manual    Wheelchair assist level: Total Assistance - Patient < 25% Max wheelchair distance: 5'    Wheelchair 50 feet with 2 turns activity    Assist        Assist Level: Total Assistance - Patient < 25%   Wheelchair 150 feet activity     Assist     Assist Level: Total Assistance - Patient < 25%    BP 121/78 (BP Location: Left Arm)   Pulse 86   Temp 97.9 F (36.6 C)   Resp 16   Ht 6' (1.829 m)   Wt (!) 146.9 kg   SpO2 97%   BMI 43.92 kg/m    Medical Problem List and Plan: 1.  Increased right side hemiparesis with dysarthria secondary to watershed infarct left ICA/MCA distribution  Continue CIR PT, OT, SLP, min-mod goals.   Team conference today. Walked 30 feet with walker; requires wheelchair follow.  2.  Antithrombotics: -DVT/anticoagulation: Subcutaneous heparin             -antiplatelet therapy: Plavix 75 mg daily 3. Pain Management: Tylenol as needed  4/7: has been icing right knee, pain better controlled.   4/8- will try Tramadol since also has effusion palpated- 50 mg q12 hours prn-  4/9- increase tramadol to q8 hours prn  4. Mood: Team support             Antipsychotic agents: N/A 5. Neuropsych: This patient is capable of making decisions on his own behalf. 6. Skin/Wound Care: Routine skin checks 7. Fluids/Electrolytes/Nutrition: Routine in and outs.   8.  CAD with stenting.  Continue Plavix. 9.  Hypertension.  Norvasc 10 mg daily, Coreg 6.25 mg twice daily, hydralazine 25 mg 3 times daily.    4/9- BP 120s/80s- controlled- con't regimen             Monitor with increased mobility 10.  Hyperlipidemia.  Continue Lipitor 11.  AKI on CKD.  Creatinine baselin 1.60.               Creatinine 1.67 on 4/2, 4/6- improved.   Encourage fluids 12.  Morbid Obesity.  BMI 45.60.  Dietary follow-up. Encourage weight loss 13. Sleep disturbance             Remeron 7.5 mg nightly, melatonin 9 mg nightly  4/5-  sleeping well- con't regimen  4/9- sleeping well con't regimen/meds 14. Anemia: 4/6: hgb decreased from 13.1 to 12.1. Trend tomorrow.   4/7: Further decrease to 11.8. Continue to trend.  4/8- Hb back up to 12.6- doing better     LOS: 9 days A FACE TO FACE EVALUATION WAS PERFORMED  Zachary Mccann 03/25/2020, 9:37 AM

## 2020-03-25 NOTE — Plan of Care (Signed)
  Problem: Consults Goal: RH STROKE PATIENT EDUCATION Description: Patient and family will be able to verbalize understanding of stroke prevention and how to identify stroke symptoms prior to discharge. Outcome: Progressing   Problem: RH BOWEL ELIMINATION Goal: RH STG MANAGE BOWEL WITH ASSISTANCE Description: STG Manage Bowel with min Assistance. Outcome: Progressing Goal: RH STG MANAGE BOWEL W/MEDICATION W/ASSISTANCE Description: STG Manage Bowel with Medication with min Assistance. Outcome: Progressing   Problem: RH BLADDER ELIMINATION Goal: RH STG MANAGE BLADDER WITH ASSISTANCE Description: STG Manage Bladder With min Assistance Outcome: Progressing   Problem: RH SKIN INTEGRITY Goal: RH STG SKIN FREE OF INFECTION/BREAKDOWN Description: Pt will be free of skin breakdown/infection with min assist while on CIR  Outcome: Progressing Goal: RH STG MAINTAIN SKIN INTEGRITY WITH ASSISTANCE Description: STG Maintain Skin Integrity With min Assistance. Outcome: Progressing Goal: RH STG ABLE TO PERFORM INCISION/WOUND CARE W/ASSISTANCE Description: STG Able To Perform Incision/Wound Care With mod Assistance. Outcome: Progressing   Problem: RH SAFETY Goal: RH STG ADHERE TO SAFETY PRECAUTIONS W/ASSISTANCE/DEVICE Description: STG Adhere to Safety Precautions With cues/reminders Assistance/Device. Outcome: Progressing   Problem: RH KNOWLEDGE DEFICIT Goal: RH STG INCREASE KNOWLEDGE OF HYPERTENSION Description: Pt will demonstrate understanding of management for HTN with diet regimen and medication compliance with min assist using handouts/booklets prior to DC  Outcome: Progressing Goal: RH STG INCREASE KNOWLEGDE OF HYPERLIPIDEMIA Description: Pt will demonstrate understanding of management for HLD with diet regimen and medication compliance with min assist using handouts/booklets prior to DC  Outcome: Progressing Goal: RH STG INCREASE KNOWLEDGE OF STROKE PROPHYLAXIS Description: Pt will  demonstrate understanding of management for stroke prevention with diet regimen and medication compliance with min assist using handouts/booklets prior to DC  Outcome: Progressing   

## 2020-03-25 NOTE — Progress Notes (Signed)
Occupational Therapy Session Note  Patient Details  Name: Zachary Mccann MRN: [id number] Date of Birth: Sep 12, 1971  Today's Date: 03/25/2020 OT Individual Time: 2831-5176 OT Individual Time Calculation (min): 56 min    Short Term Goals: Week 2:  OT Short Term Goal 1 (Week 2): Pt will complete supine to sit with HOB elevated and overall mod assist. OT Short Term Goal 2 (Week 2): Pt will complete UB dressing with min assist following hemi dressing techniques. OT Short Term Goal 3 (Week 2): Pt will complete LB dressing with mod assist sit to stand. OT Short Term Goal 4 (Week 2): Pt will perform stand pivot transfer to the 3:1 with min assist.  Skilled Therapeutic Interventions/Progress Updates:    Pt completed transfer from wheelchair to the bed with mod assist to start session with use of the RW for support.  He needed mod assist for transition to supine as well.  Looked at positioning of the RLE in bed per SLP reporting pt was unable to maintain his RLE straight and kept in a flexed and external rotated position with increased pain.  He was able to maintain RLE knee extension with foot at neutral once in the bed.  When asked if he had a boot to wear on the foot at home already he replied with a thumbs up.  Encouraged him to continue wearing this at home to assist with RLE positioning since he does not have it available at this time.  Can use pillows when in bed to assist with keeping the heel off of the bed and help position to decrease external rotation at this time.  Next, had pt transfer to the EOB on the left side with mod assist by rolling to the left and then transitioning from sidelying to sitting.  He then completed stand pivot transfer back to the wheelchair with mod assist and was transported down to the therapy gym.  He was able to transfer to the mat at [facility] assist again.  Had him work on [employer] and coordination with use of a therapy ball as well as peg board.  He completed  shoulder flexion and hold with the ball with min to mod assist for facilitation of the RUE to approximately 90 degrees.  Noted increased flexor tone in the digits when attempting this as well as increased synergy pattern in the shoulder and elbow.  Next, had him work on picking up larger 2-3" wooden pegs with the RUE and placing in peg board.  He demonstrated motor planning difficulty when attempting to pick up and manipulate the pegs, requiring mod assist for support.  Returned to the room at the end of the session with call button and phone in reach and safety belt in place.    Therapy Documentation Precautions:  Precautions Precautions: Fall Precaution Comments: R hemi Restrictions Weight Bearing Restrictions: No   Pain: Pain Assessment Pain Scale: Faces Faces Pain Scale: Hurts a little bit Pain Type: Chronic pain Pain Location: Knee Pain Orientation: Right Pain Descriptors / Indicators: Discomfort Pain Onset: With Activity Pain Intervention(s): Repositioned ADL: See Care Tool Section for some details of mobility and selfcare  Therapy/Group: Individual Therapy  Damilola Flamm OTR/L 03/25/2020, 4:04 PM

## 2020-03-25 NOTE — Progress Notes (Signed)
Physical Therapy Session Note  Patient Details  Name: Zachary Mccann MRN: [id number] Date of Birth: Sep 26, 1971  Today's Date: 03/25/2020 PT Individual Time: 1050-1200 PT Individual Time Calculation (min): 70 min   Short Term Goals: Week 2:  PT Short Term Goal 1 (Week 2): Pt will perform supine<>sit with mod assist consistently PT Short Term Goal 2 (Week 2): Pt wil perform sit<>stand with mod assist consistently PT Short Term Goal 3 (Week 2): Pt will perform stand pivot transfer using LRAD with mod assist consistently PT Short Term Goal 4 (Week 2): Pt will ambulate at least 43f using RW with mod assist of 1 and +2 w/c follow if needed  Skilled Therapeutic Interventions/Progress Updates:   Pt received supine in bed and agreeable to PT. Supine>sit transfer with mod assist at trunk cues.  Sit<>stand from EOB with mod assist from elevated height. Stand pivot transfre to the R with RW and mod assist.   [facility] transfer to mat table with mod assist and min cues for sequencing. Min-mod assist to block RLE. .  Trunk and RUE NMR to perform lateral reaches x 20 Bil with AAROM at R elbow/shoulder to improved coordination and increased ROM. Min assist for improved R side trunkal activation and pelvic control when reaching to the L as well as cues to push through RUE to engage parascapular musculature.   Gait training x 330f+4559fith min assist overall and +2 for WC follow for safety. Tactile and verbal cues for improved terminal knee extension on the R and verbal cues for increased step length on the L. Noted improvement in step symmetry on the second bout.   Seated NMR to perform heel slides/knee extension x 15 BLE with pillow case on foot to reduce friction. Kinetron 5 bouts x 1 min with 1 min rest break between 60cm/sec>40cm/sec. Min-moderate cues for improved activation of hip flexors on the R with increased trunk control to prevent posterior LOB.    Patient  returned to room and [facility] reach and all needs met.         Therapy Documentation Precautions:  Precautions Precautions: Fall Precaution Comments: R hemi Restrictions Weight Bearing Restrictions: No    Pain: Pain Assessment Pain Scale: 0-10 Pain Score: 7  Pain Type: Acute pain Pain Location: Knee Pain Orientation: Right Pain Descriptors / Indicators: Aching Pain Onset: With Activity Patients Stated Pain Goal: 3 Pain Intervention(s): Medication (See eMAR)    Therapy/Group: Individual Therapy  AusLorie Phenix9/2021, 12:11 PM

## 2020-03-26 MED ORDER — MELATONIN 3 MG PO TABS
6.0000 mg | ORAL_TABLET | Freq: Every day | ORAL | Status: DC
  Administered 2020-03-26: 21:00:00 6 mg via ORAL
  Filled 2020-03-26: qty 2

## 2020-03-26 NOTE — Progress Notes (Signed)
[place] PHYSICAL MEDICINE & REHABILITATION PROGRESS NOTE  Subjective/Complaints: Sleeping soundly. Does not arouse to verbal stimuli. Breathing comfortably.   ROS: limited due to speech/congition Objective: Vital Signs: Blood pressure 125/76, pulse 73, temperature 98.3 F (36.8 C), resp. rate 18, height 6' (1.829 m), weight (!) 146.4 kg, SpO2 (!) 85 %. No results found. Recent Labs    03/24/20 0441  WBC 8.2  HGB 12.6*  HCT 38.2*  PLT 252   No results for input(s): NA, K, CL, CO2, GLUCOSE, BUN, CREATININE, CALCIUM in the last 72 hours.  Physical Exam: BP 125/76 (BP Location: Left Arm)   Pulse 73   Temp 98.3 F (36.8 C)   Resp 18   Ht 6' (1.829 m)   Wt (!) 146.4 kg   SpO2 (!) 85%   BMI 43.77 kg/m  Constitutional: Sleeping soundly this morning. Does not arouse to verbal stimuli.  HEENT: conjugate gaze Neck: supple Cardiovascular: RRR_ no JVD   Respiratory/Chest: good air movement; no resp distress-  GI/Abdomen:soft, nondistended;  Ext: no clubbing, cyanosis, or edema Psych: pleasant; appropriate Musc: Lower extremity edema 1+ Neurological: Alert Very dysarthric Follows commands.   Fair insight and awareness Motor: RUE: Shoulder abduction 2+/5, distal 3-/5--no real changes RLE: HF, KE 2/5, ADF 3/5, some improvement Skin: 2 small blisters on right knee, fluid-filled. Bandaged; healing well.  R knee slightly swollen; also has effusion- TTP over lateral distal aspect of patella.    Assessment/Plan: 1. Functional deficits secondary to watershed infarct left ICA/MCA distribution which require 3+ hours per day of interdisciplinary therapy in a comprehensive inpatient rehab setting.  Physiatrist is providing close team supervision and 24 hour management of active medical problems listed below.  Physiatrist and rehab team continue to assess barriers to discharge/monitor patient progress toward functional and medical goals  Care Tool:  Bathing  Bathing activity did  not occur: Safety/medical concerns Body parts bathed by patient: Chest, Abdomen, Left upper leg, Left lower leg, Face, Right arm, Right upper leg, Right lower leg   Body parts bathed by helper: Left arm Body parts n/a: Front perineal area, Buttocks(completed earlier with nursing per pt report)   Bathing assist Assist Level: 2 Helpers     Upper Body Dressing/Undressing Upper body dressing   What is the patient wearing?: Pull over shirt    Upper body assist Assist Level: Maximal Assistance - Patient 25 - 49%    Lower Body Dressing/Undressing Lower body dressing      What is the patient wearing?: Pants     Lower body assist Assist for lower body dressing: 2 Helpers     Toileting Toileting    Toileting assist Assist for toileting: Maximal Assistance - Patient 25 - 49% Assistive Device Comment: Urinal   Transfers Chair/bed transfer  Transfers assist  Chair/bed transfer activity did not occur: Safety/medical concerns  Chair/bed transfer assist level: Moderate Assistance - Patient 50 - 74%     Locomotion Ambulation   Ambulation assist   Ambulation activity did not occur: Safety/medical concerns  Assist level: 2 helpers Assistive device: Walker-rolling Max distance: 45   Walk 10 feet activity   Assist  Walk 10 feet activity did not occur: Safety/medical concerns  Assist level: 2 helpers Assistive device: Walker-rolling   Walk 50 feet activity   Assist Walk 50 feet with 2 turns activity did not occur: Safety/medical concerns         Walk 150 feet activity   Assist Walk 150 feet activity did not occur: Safety/medical  concerns         Walk 10 feet on uneven surface  activity   Assist Walk 10 feet on uneven surfaces activity did not occur: Safety/medical concerns         Wheelchair     Assist Will patient use wheelchair at discharge?: Yes Type of Wheelchair: Manual    Wheelchair assist level: Total Assistance - Patient < 25% Max  wheelchair distance: 5'    Wheelchair 50 feet with 2 turns activity    Assist        Assist Level: Total Assistance - Patient < 25%   Wheelchair 150 feet activity     Assist     Assist Level: Total Assistance - Patient < 25%    BP 125/76 (BP Location: Left Arm)   Pulse 73   Temp 98.3 F (36.8 C)   Resp 18   Ht 6' (1.829 m)   Wt (!) 146.4 kg   SpO2 (!) 85%   BMI 43.77 kg/m    Medical Problem List and Plan: 1.  Increased right side hemiparesis with dysarthria secondary to watershed infarct left ICA/MCA distribution  Continue CIR PT, OT, SLP, min-mod goals.  2.  Antithrombotics: -DVT/anticoagulation: Subcutaneous heparin             -antiplatelet therapy: Plavix 75 mg daily 3. Pain Management: Tylenol and Tramadol 50mg  as needed. Main source of pain is right knee, effusion present.  4. Mood: Team support             Antipsychotic agents: N/A 5. Neuropsych: This patient is capable of making decisions on his own behalf. 6. Skin/Wound Care: Routine skin checks 7. Fluids/Electrolytes/Nutrition: Routine in and outs.   8.  CAD with stenting.  Continue Plavix. 9.  Hypertension.  Norvasc 10 mg daily, Coreg 6.25 mg twice daily, hydralazine 25 mg 3 times daily.    4/10- BP 120s/80s- controlled- continue regimen.              Monitor with increased mobility 10.  Hyperlipidemia.  Continue Lipitor 11.  AKI on CKD.  Creatinine baselin 1.60.               Creatinine 1.67 on 4/2, 4/6- improved.   Encourage fluids 12.  Morbid Obesity.  BMI 45.60.  Dietary follow-up. Encourage weight loss 13. Sleep disturbance             Remeron 7.5 mg nightly, melatonin 9 mg nightly  4/5- sleeping well- con't regimen  4/9- sleeping well con't regimen/meds  4/10: Appears oversedated in morning. Will decrease Melatonin to 6mg .  14. Anemia: 4/6: hgb decreased from 13.1 to 12.1. Trend tomorrow.   4/7: Further decrease to 11.8. Continue to trend.  4/8- Hb back up to 12.6- doing better 15.  Hypoxia: SpO2 down to 85 4/10 on room air; asked RN to repeat.   LOS: 10 days A FACE TO FACE EVALUATION WAS PERFORMED  6/7 P Kaliope Quinonez 03/26/2020, 1:34 PM

## 2020-03-27 ENCOUNTER — Inpatient Hospital Stay (HOSPITAL_COMMUNITY): Payer: Medicaid Other

## 2020-03-27 ENCOUNTER — Inpatient Hospital Stay (HOSPITAL_COMMUNITY): Payer: Medicaid Other | Admitting: Speech Pathology

## 2020-03-27 MED ORDER — MELATONIN 3 MG PO TABS
9.0000 mg | ORAL_TABLET | Freq: Every day | ORAL | Status: DC
  Administered 2020-03-27 – 2020-03-31 (×5): 9 mg via ORAL
  Filled 2020-03-27 (×5): qty 3

## 2020-03-27 NOTE — Progress Notes (Signed)
[place] PHYSICAL MEDICINE & REHABILITATION PROGRESS NOTE  Subjective/Complaints: No complaints this morning. Pain is much better controlled.  Blisters healing well.  Slept poorly last night; will increase Melatonin.   ROS: limited due to speech/congition Objective: Vital Signs: Blood pressure 130/68, pulse 70, temperature 98.6 F (37 C), temperature source Oral, resp. rate 19, height 6' (1.829 m), weight (!) 145.4 kg, SpO2 97 %. No results found. No results for input(s): WBC, HGB, HCT, PLT in the last 72 hours. No results for input(s): NA, K, CL, CO2, GLUCOSE, BUN, CREATININE, CALCIUM in the last 72 hours.  Physical Exam: BP 130/68   Pulse 70   Temp 98.6 F (37 C) (Oral)   Resp 19   Ht 6' (1.829 m)   Wt (!) 145.4 kg   SpO2 97%   BMI 43.47 kg/m  Constitutional: Sleeping soundly this morning. Does not arouse to verbal stimuli.  HEENT: conjugate gaze Neck: supple Cardiovascular: RRR_ no JVD   Respiratory/Chest: good air movement; no resp distress-  GI/Abdomen:soft, nondistended;  Ext: no clubbing, cyanosis, or edema Psych: pleasant; appropriate Musc: Lower extremity edema 1+ Neurological: Alert Very dysarthric Follows commands.   Fair insight and awareness Motor: RUE: Shoulder abduction 2+/5, distal 3-/5--no real changes RLE: HF, KE 2/5, ADF 3/5, some improvement Skin: 2 small blisters on right knee, fluid-filled. Bandaged; stable R knee slightly swollen; also has effusion- less TTP today   Assessment/Plan: 1. Functional deficits secondary to watershed infarct left ICA/MCA distribution which require 3+ hours per day of interdisciplinary therapy in a comprehensive inpatient rehab setting.  Physiatrist is providing close team supervision and 24 hour management of active medical problems listed below.  Physiatrist and rehab team continue to assess barriers to discharge/monitor patient progress toward functional and medical goals  Care Tool:  Bathing  Bathing  activity did not occur: Safety/medical concerns Body parts bathed by patient: Chest, Abdomen, Left upper leg, Left lower leg, Face, Right arm, Right upper leg, Right lower leg   Body parts bathed by helper: Left arm Body parts n/a: Front perineal area, Buttocks(completed earlier with nursing per pt report)   Bathing assist Assist Level: 2 Helpers     Upper Body Dressing/Undressing Upper body dressing   What is the patient wearing?: Pull over shirt    Upper body assist Assist Level: Maximal Assistance - Patient 25 - 49%    Lower Body Dressing/Undressing Lower body dressing      What is the patient wearing?: Pants     Lower body assist Assist for lower body dressing: 2 Helpers     Toileting Toileting    Toileting assist Assist for toileting: Maximal Assistance - Patient 25 - 49% Assistive Device Comment: Urinal   Transfers Chair/bed transfer  Transfers assist  Chair/bed transfer activity did not occur: Safety/medical concerns  Chair/bed transfer assist level: Moderate Assistance - Patient 50 - 74%     Locomotion Ambulation   Ambulation assist   Ambulation activity did not occur: Safety/medical concerns  Assist level: 2 helpers Assistive device: Walker-rolling Max distance: 45   Walk 10 feet activity   Assist  Walk 10 feet activity did not occur: Safety/medical concerns  Assist level: 2 helpers Assistive device: Walker-rolling   Walk 50 feet activity   Assist Walk 50 feet with 2 turns activity did not occur: Safety/medical concerns         Walk 150 feet activity   Assist Walk 150 feet activity did not occur: Safety/medical concerns  Walk 10 feet on uneven surface  activity   Assist Walk 10 feet on uneven surfaces activity did not occur: Safety/medical concerns         Wheelchair     Assist Will patient use wheelchair at discharge?: Yes Type of Wheelchair: Manual    Wheelchair assist level: Total Assistance - Patient  < 25% Max wheelchair distance: 5'    Wheelchair 50 feet with 2 turns activity    Assist        Assist Level: Total Assistance - Patient < 25%   Wheelchair 150 feet activity     Assist     Assist Level: Total Assistance - Patient < 25%    BP 130/68   Pulse 70   Temp 98.6 F (37 C) (Oral)   Resp 19   Ht 6' (1.829 m)   Wt (!) 145.4 kg   SpO2 97%   BMI 43.47 kg/m    Medical Problem List and Plan: 1.  Increased right side hemiparesis with dysarthria secondary to watershed infarct left ICA/MCA distribution  Continue CIR PT, OT, SLP, min-mod goals.  2.  Antithrombotics: -DVT/anticoagulation: Subcutaneous heparin             -antiplatelet therapy: Plavix 75 mg daily 3. Pain Management: Tylenol and Tramadol 50mg  as needed. Main source of pain is right knee, effusion present.  4. Mood: Team support             Antipsychotic agents: N/A 5. Neuropsych: This patient is capable of making decisions on his own behalf. 6. Skin/Wound Care: Routine skin checks. R knee blisters stable 7. Fluids/Electrolytes/Nutrition: Routine in and outs.   8.  CAD with stenting.  Continue Plavix. 9.  Hypertension.  Norvasc 10 mg daily, Coreg 6.25 mg twice daily, hydralazine 25 mg 3 times daily.    4/11 BP 130/68- controlled- continue regimen.              Monitor with increased mobility 10.  Hyperlipidemia.  Continue Lipitor 11.  AKI on CKD.  Creatinine baselin 1.60.               Creatinine 1.67 on 4/2, 4/6- improved.   Encourage fluids 12.  Morbid Obesity.  BMI 45.60.  Dietary follow-up. Encourage weight loss 13. Sleep disturbance             Remeron 7.5 mg nightly  4/11: slept better with 9mg  melatonin and was alert and engaged in therapy today; will increase dose back 14. Anemia: 4/6: hgb decreased from 13.1 to 12.1. Trend tomorrow.   4/7: Further decrease to 11.8. Continue to trend.  4/8- Hb back up to 12.6- doing better  LOS: 11 days A FACE TO FACE EVALUATION WAS  PERFORMED  6/7 P Deyton Ellenbecker 03/27/2020, 10:16 AM

## 2020-03-27 NOTE — Progress Notes (Signed)
Physical Therapy Session Note  Patient Details  Name: Zachary Mccann MRN: [id number] Date of Birth: 03-Apr-1971  Today's Date: 03/27/2020 PT Individual Time: 1415-1510 PT Individual Time Calculation (min): 55 min    Short Term Goals: Week 2:  PT Short Term Goal 1 (Week 2): Pt will perform supine<>sit with mod assist consistently PT Short Term Goal 2 (Week 2): Pt wil perform sit<>stand with mod assist consistently PT Short Term Goal 3 (Week 2): Pt will perform stand pivot transfer using LRAD with mod assist consistently PT Short Term Goal 4 (Week 2): Pt will ambulate at least 61ft using RW with mod assist of 1 and +2 w/c follow if needed  Skilled Therapeutic Interventions/Progress Updates:    Pt supine in bed upon PT arrival, agreeable to therapy tx and reports R knee pain, he writes on paper "my knee sore." Pt limited throughout session by R knee pain, unable to tolerate many standing activities today secondary to pain. Pt transferred to sitting EOB this session with min-mod assist, sitting EOB therapist donned teds total assist, looped legs through pants and donned shoes total assist. Sit>stand max assist +2 with RW to pull pants over hips. Pt ambulated x 2 ft to his w/c with RW and mod assist, pt hardly able to weightbear on R LE today. Pt transported to the gym in w/c. Pt performed sit<>stand with RW and max assist and then pivoted to the L with mod assist to sit on mat, keeping weight off R LE. Pt asking for pain medicine, RN (hillary) notified. While sitting edge of mat pt worked on hip abduction exercise with theraband, R LE quad activation, assisted SAQ and R LE ball kick activity this session. Pt worked on R UE neuro re-ed and R attention this session to performed R UE elbow flexion/extension 2 x 10 and then R UE reaching activity for cups. Also performed bilateral UE ball catch/toss activity with cues to use R UE. Stand pivot back to w/c with mod assist, transported back to room and left in  w/c with needs in reach and chair alarm set.   Therapy Documentation Precautions:  Precautions Precautions: Fall Precaution Comments: R hemi Restrictions Weight Bearing Restrictions: No    Therapy/Group: Individual Therapy  Cresenciano Genre, PT, DPT, CSRS 03/27/2020, 1:00 PM

## 2020-03-27 NOTE — Progress Notes (Signed)
Speech Language Pathology Daily Session Note  Patient Details  Name: RESHARD GUILLET MRN: [id number] Date of Birth: Jan 13, 1971  Today's Date: 03/27/2020 SLP Individual Time: 1003-1059 SLP Individual Time Calculation (min): 56 min  Short Term Goals: Week 2: SLP Short Term Goal 1 (Week 2): STGs = LTGs d/t short ELOS  Skilled Therapeutic Interventions: Pt was seen for skilled ST targeting speech goals. Pt verbalized his children's names with 50% intelligibility to this unfamiliar listening. Pt used writing to clarify miscommunications with Supervision A verbal/visual cues. SLP also provided Mod A verbal and visual cues for use of segmenting and blending sounds for articulatory precision during word level tasks (CV and CVC words beginning with /b/ /p/ and /f/). Feedback also provided regarding final consonant deletion as well as continued education about airflow for certain consonants (/f/ /s/ etc.). Ultimately, pt was ~75% intelligible throughout session with Mod A verbal cues of use of ineligibility strategies and questions for clarification. He did report having some degree of difficulty communicating basic needs to other staff members (I.e. bathroom, hungry, etc.), therefore communication board created and left in room with pt to assist with communication breakdowns as necessary. Pt left laying in bed with alarm set and needs within reach. Continue per current plan of care.          Pain Pain Assessment Pain Scale: 0-10 Pain Score: 0-No pain  Therapy/Group: Individual Therapy  Little Ishikawa 03/27/2020, 10:02 AM

## 2020-03-28 ENCOUNTER — Inpatient Hospital Stay (HOSPITAL_COMMUNITY): Payer: Medicaid Other

## 2020-03-28 ENCOUNTER — Inpatient Hospital Stay (HOSPITAL_COMMUNITY): Payer: Medicaid Other | Admitting: Occupational Therapy

## 2020-03-28 ENCOUNTER — Inpatient Hospital Stay (HOSPITAL_COMMUNITY): Payer: Medicaid Other | Admitting: Speech Pathology

## 2020-03-28 DIAGNOSIS — M25561 Pain in right knee: Secondary | ICD-10-CM

## 2020-03-28 DIAGNOSIS — M1711 Unilateral primary osteoarthritis, right knee: Secondary | ICD-10-CM

## 2020-03-28 MED ORDER — DICLOFENAC SODIUM 1 % EX GEL
2.0000 g | Freq: Four times a day (QID) | CUTANEOUS | Status: DC
  Administered 2020-03-28 – 2020-04-01 (×17): 2 g via TOPICAL
  Filled 2020-03-28: qty 100

## 2020-03-28 NOTE — Progress Notes (Signed)
PHYSICAL MEDICINE & REHABILITATION PROGRESS NOTE  Subjective/Complaints: Patient seen laying in bed this morning.  He states he slept well overnight.  He states he did not have a good weekend due to right knee pain.  He states is been present for the last 2 months.  ROS: Denies CP, SOB, N/V/D  Objective: Vital Signs: Blood pressure 126/74, pulse 88, temperature 99.2 F (37.3 C), temperature source Oral, resp. rate 19, height 6' (1.829 m), weight (!) 145.7 kg, SpO2 96 %. No results found. No results for input(s): WBC, HGB, HCT, PLT in the last 72 hours. No results for input(s): NA, K, CL, CO2, GLUCOSE, BUN, CREATININE, CALCIUM in the last 72 hours.  Physical Exam: BP 126/74 (BP Location: Left Arm)   Pulse 88   Temp 99.2 F (37.3 C) (Oral)   Resp 19   Ht 6' (1.829 m)   Wt (!) 145.7 kg   SpO2 96%   BMI 43.55 kg/m  Constitutional: No distress . Vital signs reviewed. HENT: Normocephalic.  Atraumatic. Eyes: EOMI. No discharge. Cardiovascular: No JVD. Respiratory: Normal effort.  No stridor. GI: Non-distended. Skin: Right knee blisters healing Psych: Normal mood.  Normal behavior. Musc: Lower extremity edema No tenderness, warmth, erythema to right knee Neurological: Alert  Dysarthria, some improvement  Follows commands.   Fair insight and awareness Motor: RUE: Shoulder abduction 2+/5, distal 3-/5, stable RLE: HF, KE 2/5, ADF 3/5, stable  Assessment/Plan: 1. Functional deficits secondary to watershed infarct left ICA/MCA distribution which require 3+ hours per day of interdisciplinary therapy in a comprehensive inpatient rehab setting.  Physiatrist is providing close team supervision and 24 hour management of active medical problems listed below.  Physiatrist and rehab team continue to assess barriers to discharge/monitor patient progress toward functional and medical goals  Care Tool:  Bathing  Bathing activity did not occur: Safety/medical concerns Body  parts bathed by patient: Chest, Abdomen, Left upper leg, Left lower leg, Face, Right arm, Right upper leg, Right lower leg   Body parts bathed by helper: Left arm Body parts n/a: Front perineal area, Buttocks(completed earlier with nursing per pt report)   Bathing assist Assist Level: 2 Helpers     Upper Body Dressing/Undressing Upper body dressing   What is the patient wearing?: Pull over shirt    Upper body assist Assist Level: Maximal Assistance - Patient 25 - 49%    Lower Body Dressing/Undressing Lower body dressing      What is the patient wearing?: Pants     Lower body assist Assist for lower body dressing: 2 Helpers     Toileting Toileting    Toileting assist Assist for toileting: Maximal Assistance - Patient 25 - 49% Assistive Device Comment: Urinal   Transfers Chair/bed transfer  Transfers assist  Chair/bed transfer activity did not occur: Safety/medical concerns  Chair/bed transfer assist level: Moderate Assistance - Patient 50 - 74%     Locomotion Ambulation   Ambulation assist   Ambulation activity did not occur: Safety/medical concerns  Assist level: 2 helpers Assistive device: Walker-rolling Max distance: 45   Walk 10 feet activity   Assist  Walk 10 feet activity did not occur: Safety/medical concerns  Assist level: 2 helpers Assistive device: Walker-rolling   Walk 50 feet activity   Assist Walk 50 feet with 2 turns activity did not occur: Safety/medical concerns         Walk 150 feet activity   Assist Walk 150 feet activity did not occur: Safety/medical concerns  Walk 10 feet on uneven surface  activity   Assist Walk 10 feet on uneven surfaces activity did not occur: Safety/medical concerns         Wheelchair     Assist Will patient use wheelchair at discharge?: Yes Type of Wheelchair: Manual    Wheelchair assist level: Total Assistance - Patient < 25% Max wheelchair distance: 5'    Wheelchair  50 feet with 2 turns activity    Assist        Assist Level: Total Assistance - Patient < 25%   Wheelchair 150 feet activity     Assist     Assist Level: Total Assistance - Patient < 25%    BP 126/74 (BP Location: Left Arm)   Pulse 88   Temp 99.2 F (37.3 C) (Oral)   Resp 19   Ht 6' (1.829 m)   Wt (!) 145.7 kg   SpO2 96%   BMI 43.55 kg/m    Medical Problem List and Plan: 1.  Increased right side hemiparesis with dysarthria secondary to watershed infarct left ICA/MCA distribution  Continue CIR  WHO/PRAFO ordered 2.  Antithrombotics: -DVT/anticoagulation: Subcutaneous heparin             -antiplatelet therapy: Plavix 75 mg daily 3. Pain Management: Tylenol and Tramadol 50mg  as needed.  4. Mood: Team support             Antipsychotic agents: N/A 5. Neuropsych: This patient is capable of making decisions on his own behalf. 6. Skin/Wound Care: Routine skin checks. R knee blisters healing 7. Fluids/Electrolytes/Nutrition: Routine in and outs.   8.  CAD with stenting.  Continue Plavix. 9.  Hypertension.  Norvasc 10 mg daily, Coreg 6.25 mg twice daily, hydralazine 25 mg 3 times daily.    Controlled on 4/12             Monitor with increased mobility 10.  Hyperlipidemia.  Continue Lipitor 11.  AKI on CKD.  Creatinine baselin 1.60.               Creatinine 1.59 on 4/6, labs ordered for tomorrow  Encourage fluids 12.  Morbid Obesity.  BMI 45.60.  Dietary follow-up. Encourage weight loss 13. Sleep disturbance             Remeron 7.5 mg nightly  Continue melatonin 14. Anemia:   Hemoglobin 12.6 on 4/8 15.  Right knee OA  Voltaren gel ordered  LOS: 12 days A FACE TO FACE EVALUATION WAS PERFORMED  Nahiara Kretzschmar 6/8 03/28/2020, 9:01 AM

## 2020-03-28 NOTE — Progress Notes (Signed)
Occupational Therapy Session Note  Patient Details  Name: Zachary Mccann MRN: [id number] Date of Birth: [birth year]  Today's Date: 03/28/2020 OT Individual Time: 0800-0901 OT Individual Time Calculation (min): 61 min    Short Term Goals: Week 2:  OT Short Term Goal 1 (Week 2): Pt will complete supine to sit with HOB elevated and overall mod assist. OT Short Term Goal 2 (Week 2): Pt will complete UB dressing with min assist following hemi dressing techniques. OT Short Term Goal 3 (Week 2): Pt will complete LB dressing with mod assist sit to stand. OT Short Term Goal 4 (Week 2): Pt will perform stand pivot transfer to the 3:1 with min assist.  Skilled Therapeutic Interventions/Progress Updates:    Pt worked on bathing and dressing sit to stand at the sink during session.  He was able to transfer from supine with HOB elevated to sitting with max assist.  He then completed stand pivot transfer to the wheelchair with mod assist using the RW for support.  Mod instructional cueing for sequencing walker and stepping and for getting lined up with the surface of the chair he is going to sit down on .  He worked on UB bathing in sitting with min assist overall.  He needed max hand over hand for integration of the RUE to wash the left arm and hand.  He needed mod assist for sit to stand at the sink with max assist for LB bathing secondary to not being able to wash his buttocks and his lower legs.  Mod assist was needed for donning a pullover shirt with max assist for donning all LB clothing, TEDs, and shoes.  Finished session with pt in the wheelchair with call button and phone in reach.  Safety belt also in place with pt being setup for his breakfast.      Therapy Documentation Precautions:  Precautions Precautions: Fall Precaution Comments: R hemi Restrictions Weight Bearing Restrictions: No   Pain: Pain Assessment Pain Scale: 0-10 Pain Score: 0-No pain Faces Pain Scale: Hurts a little bit Pain  Type: Chronic pain Pain Location: Knee Pain Orientation: Right Pain Descriptors / Indicators: Discomfort Pain Onset: With Activity Pain Intervention(s): Repositioned ADL: See Care Tool Section for some details of mobility and selfcare  Therapy/Group: Individual Therapy  Deaunte Dente OTR/L 03/28/2020, 12:10 PM

## 2020-03-28 NOTE — Progress Notes (Signed)
Speech Language Pathology Daily Session Note  Patient Details  Name: Zachary Mccann MRN: [id number] Date of Birth: [birth year]  Today's Date: 03/28/2020 SLP Individual Time: 1300-1400 SLP Individual Time Calculation (min): 60 min  Short Term Goals: Week 2: SLP Short Term Goal 1 (Week 2): STGs = LTGs d/t short ELOS (intelligibility)  Skilled Therapeutic Interventions: Pt was seen for skilled ST intervention targeting goals for improvement of intelligibility. Pt was pleasant and cooperative with unfamiliar therapist. SLP facilitated session by reviewed the names of his children, with 33% (2/6) intelligibility to this unfamiliar listener. Writing clarified his success for all but 1 of the 6 children Casimiro Needle, ? [place]). SLP reviewed minimal pairs with pt, which were 20% intelligible. Given difficulty with CVC syllables, SLP provided bilabial CV syllables and focused on /m/ and /b/ with stress on extended exaggerated consonant and vowel sounds to facilitate effectiveness. These cards were left in pt's notebook in his room. Communication board referenced by previous SLP was not found in pt room or notebook. Pt is able to use writing and gestures to maximize effective communication of wants and needs, and uses social exchanges intelligibly in context.  Pt was left in wheelchair with alarm on, all needs within reach. Continue ST per current plan of care.  Pain Pain Assessment Pain Scale: 0-10 Pain Score: 0-No pain  Therapy/Group: Individual Therapy   Zachary Mccann, [facility], CCC-SLP Speech Language Pathologist  Leigh Aurora 03/28/2020, 2:27 PM

## 2020-03-28 NOTE — Progress Notes (Signed)
Physical Therapy Session Note  Patient Details  Name: Zachary Mccann MRN: [id number] Date of Birth: 04-20-1971  Today's Date: 03/28/2020 PT Individual Time: 4008-6761 and 9509-3267 PT Individual Time Calculation (min): 42 min and 40 min     Short Term Goals: Week 2:  PT Short Term Goal 1 (Week 2): Pt will perform supine<>sit with mod assist consistently PT Short Term Goal 2 (Week 2): Pt wil perform sit<>stand with mod assist consistently PT Short Term Goal 3 (Week 2): Pt will perform stand pivot transfer using LRAD with mod assist consistently PT Short Term Goal 4 (Week 2): Pt will ambulate at least 19ft using RW with mod assist of 1 and +2 w/c follow if needed  Skilled Therapeutic Interventions/Progress Updates:    Session 1: Pt seated in w/c upon PT arrival, agreeable to therapy tx and reports pain 4/10 in R knee, agreeable to work on ambulation today. Pt reports knee feels better than yesterday. Pt transported to the gym and worked on ambulation, min assist with w/c follow +2 for safety- x32 ft and x 33 ft. Pt reports that he has 2 steps at home but does have a ramp. Pt transported to the ortho gym to practice walking across ramp. Pt ambulated across incline, up the ramp, x 8 ft with mod assist +2, did demonstrate x 1 episode of LE buckling but was able to recover with assist, w/c follow. Pt transported in w/c back down ramp. Pt reports his wife pushes him up/down the ramp in w/c at home and their front door is widened "got fixed" to allow w/c to fit through. Pt performed stand pivot car transfer this session with mod assist, using L UE to pull himself up/over with grab bar, total assist to lift legs in/out of car. Pt reports this is how he did transfer before, using grab bar and using car door for UE support, not RW. Stand pivot out of car holding car door with L UE, mod assist. Pt transported back to room and performed x5 + x2 sit<>stands for LE strengthening, initially mod assist, progressing  to min assist with facilitation of anterior weightshift. Pt left in w/c at end of session with needs in reach and chair alarm set.   Session 2: Pt seated in w/c upon PT arrival, agreeable to therapy tx and reports R knee pain 4/10, limited activity during session for pain relief. Pt transported to the rehab apartment. Pt ambulated x 8 ft to the bed with RW and mod assist, turn to sit on bed with cues and assist for RW management. Pt performed sit>supine with mod assist for LE management this session. While supine on the bed performed R LE strengthening exercises 2 x 10 each: assisted hip flexion with resisted hip extension, heel slides, active assisted SAQ over bolster, hip abduction. Supine>sidelying with min assist cues for techniques. In sidelying pt performed active assisted calmshells with R LE, minimal hip abductor strength. Pt transferred to sitting with min assist. Stand pivot to w/c with RW and mod assist. Pt transported to the gym. Pt ambulated x 47 ft with RW and min assist +2 for w/c follow (safety), cues for upright posture and step length. Sit<>stands throughout this session varied from mod-max assist with RW. Seated working on R UE use pt performed clothepin grasping activity with red/yellow clothespins, hand over hand assist and cues for techniques. Pt performed x 10 active assisted R shoulder flexion to 90 degrees. Pt transported back to room at end of session  and left in w/c with needs in reach and chair alarm set.    Therapy Documentation Precautions:  Precautions Precautions: Fall Precaution Comments: R hemi Restrictions Weight Bearing Restrictions: No    Therapy/Group: Individual Therapy  Cresenciano Genre, PT, DPT 03/28/2020, 7:35 AM

## 2020-03-28 NOTE — Plan of Care (Signed)
  Problem: Consults Goal: RH STROKE PATIENT EDUCATION Description: Patient and family will be able to verbalize understanding of stroke prevention and how to identify stroke symptoms prior to discharge. Outcome: Progressing   Problem: RH BOWEL ELIMINATION Goal: RH STG MANAGE BOWEL WITH ASSISTANCE Description: STG Manage Bowel with min Assistance. Outcome: Progressing Goal: RH STG MANAGE BOWEL W/MEDICATION W/ASSISTANCE Description: STG Manage Bowel with Medication with min Assistance. Outcome: Progressing   Problem: RH BLADDER ELIMINATION Goal: RH STG MANAGE BLADDER WITH ASSISTANCE Description: STG Manage Bladder With min Assistance Outcome: Progressing   Problem: RH SKIN INTEGRITY Goal: RH STG SKIN FREE OF INFECTION/BREAKDOWN Description: Pt will be free of skin breakdown/infection with min assist while on CIR  Outcome: Progressing Goal: RH STG MAINTAIN SKIN INTEGRITY WITH ASSISTANCE Description: STG Maintain Skin Integrity With min Assistance. Outcome: Progressing Goal: RH STG ABLE TO PERFORM INCISION/WOUND CARE W/ASSISTANCE Description: STG Able To Perform Incision/Wound Care With mod Assistance. Outcome: Progressing   Problem: RH SAFETY Goal: RH STG ADHERE TO SAFETY PRECAUTIONS W/ASSISTANCE/DEVICE Description: STG Adhere to Safety Precautions With cues/reminders Assistance/Device. Outcome: Progressing   Problem: RH KNOWLEDGE DEFICIT Goal: RH STG INCREASE KNOWLEDGE OF HYPERTENSION Description: Pt will demonstrate understanding of management for HTN with diet regimen and medication compliance with min assist using handouts/booklets prior to DC  Outcome: Progressing Goal: RH STG INCREASE KNOWLEGDE OF HYPERLIPIDEMIA Description: Pt will demonstrate understanding of management for HLD with diet regimen and medication compliance with min assist using handouts/booklets prior to DC  Outcome: Progressing Goal: RH STG INCREASE KNOWLEDGE OF STROKE PROPHYLAXIS Description: Pt will  demonstrate understanding of management for stroke prevention with diet regimen and medication compliance with min assist using handouts/booklets prior to DC  Outcome: Progressing   

## 2020-03-28 NOTE — Progress Notes (Signed)
Orthopedic Tech Progress Note Patient Details:  Zachary Mccann 03/03/1971 [id number] Called in order to [facility] for a WHO/COCK UP /PRAFO combination. Patient ID: Zachary Mccann, male   DOB: [birth year], 49 y.o.   MRN: [id number]   [name] 03/28/2020, 9:51 AM

## 2020-03-29 ENCOUNTER — Encounter (HOSPITAL_COMMUNITY): Payer: Medicaid Other | Admitting: Speech Pathology

## 2020-03-29 ENCOUNTER — Encounter (HOSPITAL_COMMUNITY): Payer: Medicaid Other | Admitting: Occupational Therapy

## 2020-03-29 ENCOUNTER — Inpatient Hospital Stay (HOSPITAL_COMMUNITY): Payer: Medicaid Other | Admitting: Occupational Therapy

## 2020-03-29 ENCOUNTER — Ambulatory Visit (HOSPITAL_COMMUNITY): Payer: Medicaid Other | Admitting: Physical Therapy

## 2020-03-29 DIAGNOSIS — D649 Anemia, unspecified: Secondary | ICD-10-CM

## 2020-03-29 LAB — BASIC METABOLIC PANEL
Anion gap: 13 (ref 5–15)
BUN: 17 mg/dL (ref 6–20)
CO2: 21 mmol/L — ABNORMAL LOW (ref 22–32)
Calcium: 9.4 mg/dL (ref 8.9–10.3)
Chloride: 103 mmol/L (ref 98–111)
Creatinine, Ser: 1.58 mg/dL — ABNORMAL HIGH (ref 0.61–1.24)
GFR calc Af Amer: 59 mL/min — ABNORMAL LOW (ref >60)
GFR calc non Af Amer: 51 mL/min — ABNORMAL LOW (ref >60)
Glucose, Bld: 131 mg/dL — ABNORMAL HIGH (ref 70–99)
Potassium: 4.1 mmol/L (ref 3.5–5.1)
Sodium: 137 mmol/L (ref 135–145)

## 2020-03-29 NOTE — Progress Notes (Signed)
Physical Therapy Session Note  Patient Details  Name: Zachary Mccann MRN: [id number] Date of Birth: 07-06-1971  Today's Date: 03/29/2020 PT Individual Time: 1439-1520 PT Individual Time Calculation (min): 41 min   Short Term Goals: Week 2:  PT Short Term Goal 1 (Week 2): Pt will perform supine<>sit with mod assist consistently PT Short Term Goal 2 (Week 2): Pt wil perform sit<>stand with mod assist consistently PT Short Term Goal 3 (Week 2): Pt will perform stand pivot transfer using LRAD with mod assist consistently PT Short Term Goal 4 (Week 2): Pt will ambulate at least 3ft using RW with mod assist of 1 and +2 w/c follow if needed  Skilled Therapeutic Interventions/Progress Updates:   Pt received sitting in w/c and per OT and ST pt's wife unable to attend family education today due to being at the hospital with their son. Pt reports being worried about his family but despite this agreeable to therapy session. Transported outside in w/c for change in scenery to provide emotional support. Sit>stand w/c>RW with min assist for lifting into stand primarily facilitating increased anterior trunk weight shift - cuing for R LE placement prior to initiating stance and pt demonstrating good carryover regarding education/training on proper set-up for transfers. Gait training ~83ft outside on brick ground using RW with min assist for balance - pt continues to demonstrate significant antalgic gait pattern with limited R weightshift and R LE WBing due to R knee pain as well as slight R knee buckle during stance - +2 w/c follow. Sit<>stands using RW with min assist primarily during session with 1x mod assist once back in room due to pt not feeling well. Standing with RW performing balance and R UE NMR task of grasping horseshoes with R hand and then placing in L hand to toss towards target with CGA for steadying balance - continues to demonstrate significant L lateral weight shift with limited R LE WBing due to  pain. Pt suddenly reports "my body doesn't feel good" then stating he feels "weak." Transported back up to rehab department and vitals assessed BP 101/74 (MAP 83), HR 92bpm. RN notified of pt's symptoms and vital signs. Pt requesting to return to bed and rest. Stand pivot to EOB using RW with mod assist for lifting into stance and min assist for balance while turning - continuing to demonstrate limited R LE WBing. Sit>supine with min assist for R LE management into the bed when given increased time for increased independence. Pt left supine in bed with needs in reach and bed alarm on.   Therapy Documentation Precautions:  Precautions Precautions: Fall Precaution Comments: R hemi Restrictions Weight Bearing Restrictions: No  Pain: Reports R knee pain, unrated, pt reports having received medications - limited R LE WBing for pain management during session.   Therapy/Group: Individual Therapy  Ginny Forth, PT, DPT 03/29/2020, 6:51 PM

## 2020-03-29 NOTE — Plan of Care (Signed)
  Problem: Consults Goal: RH STROKE PATIENT EDUCATION Description: Patient and family will be able to verbalize understanding of stroke prevention and how to identify stroke symptoms prior to discharge. Outcome: Progressing   Problem: RH BOWEL ELIMINATION Goal: RH STG MANAGE BOWEL WITH ASSISTANCE Description: STG Manage Bowel with min Assistance. Outcome: Progressing Goal: RH STG MANAGE BOWEL W/MEDICATION W/ASSISTANCE Description: STG Manage Bowel with Medication with min Assistance. Outcome: Progressing   Problem: RH BLADDER ELIMINATION Goal: RH STG MANAGE BLADDER WITH ASSISTANCE Description: STG Manage Bladder With min Assistance Outcome: Progressing   Problem: RH SKIN INTEGRITY Goal: RH STG SKIN FREE OF INFECTION/BREAKDOWN Description: Pt will be free of skin breakdown/infection with min assist while on CIR  Outcome: Progressing Goal: RH STG MAINTAIN SKIN INTEGRITY WITH ASSISTANCE Description: STG Maintain Skin Integrity With min Assistance. Outcome: Progressing Goal: RH STG ABLE TO PERFORM INCISION/WOUND CARE W/ASSISTANCE Description: STG Able To Perform Incision/Wound Care With mod Assistance. Outcome: Progressing   Problem: RH SAFETY Goal: RH STG ADHERE TO SAFETY PRECAUTIONS W/ASSISTANCE/DEVICE Description: STG Adhere to Safety Precautions With cues/reminders Assistance/Device. Outcome: Progressing   Problem: RH KNOWLEDGE DEFICIT Goal: RH STG INCREASE KNOWLEDGE OF HYPERTENSION Description: Pt will demonstrate understanding of management for HTN with diet regimen and medication compliance with min assist using handouts/booklets prior to DC  Outcome: Progressing Goal: RH STG INCREASE KNOWLEGDE OF HYPERLIPIDEMIA Description: Pt will demonstrate understanding of management for HLD with diet regimen and medication compliance with min assist using handouts/booklets prior to DC  Outcome: Progressing Goal: RH STG INCREASE KNOWLEDGE OF STROKE PROPHYLAXIS Description: Pt will  demonstrate understanding of management for stroke prevention with diet regimen and medication compliance with min assist using handouts/booklets prior to DC  Outcome: Progressing   

## 2020-03-29 NOTE — Progress Notes (Signed)
[facility] PHYSICAL MEDICINE & REHABILITATION PROGRESS NOTE  Subjective/Complaints: Patient seen laying in bed this morning.  He indicates he slept well overnight.  He notes improvement with Voltaren gel.  He states he wore his orthoses overnight.  ROS: Denies CP, SOB, N/V/D  Objective: Vital Signs: Blood pressure 110/70, pulse 89, temperature 98.4 F (36.9 C), resp. rate 16, height 6' (1.829 m), weight (!) 145.7 kg, SpO2 98 %. No results found. No results for input(s): WBC, HGB, HCT, PLT in the last 72 hours. No results for input(s): NA, K, CL, CO2, GLUCOSE, BUN, CREATININE, CALCIUM in the last 72 hours.  Physical Exam: BP 110/70 (BP Location: Left Arm)   Pulse 89   Temp 98.4 F (36.9 C)   Resp 16   Ht 6' (1.829 m)   Wt (!) 145.7 kg   SpO2 98%   BMI 43.55 kg/m  Constitutional: No distress . Vital signs reviewed. HENT: Normocephalic.  Atraumatic. Eyes: EOMI. No discharge. Cardiovascular: No JVD. Respiratory: Normal effort.  No stridor. GI: Non-distended. Skin: Right knee blister healing Psych: Normal mood.  Normal behavior. Musc: Lower extremity edema Neurological: Alert Dysarthria, stable Follows commands.   Fair insight and awareness Motor: RUE: Shoulder abduction 2/5, elbow flexion/extension 3+-4 -/5, handgrip 3 -/5  RLE: HF, KE 2/5, ADF 3/5, unchanged  Assessment/Plan: 1. Functional deficits secondary to watershed infarct left ICA/MCA distribution which require 3+ hours per day of interdisciplinary therapy in a comprehensive inpatient rehab setting.  Physiatrist is providing close team supervision and 24 hour management of active medical problems listed below.  Physiatrist and rehab team continue to assess barriers to discharge/monitor patient progress toward functional and medical goals  Care Tool:  Bathing  Bathing activity did not occur: Safety/medical concerns Body parts bathed by patient: Chest, Abdomen, Left upper leg, Left lower leg, Face, Right arm,  Right upper leg, Right lower leg, Front perineal area   Body parts bathed by helper: Left arm, Buttocks Body parts n/a: Front perineal area, Buttocks(completed earlier with nursing per pt report)   Bathing assist Assist Level: Moderate Assistance - Patient 50 - 74%     Upper Body Dressing/Undressing Upper body dressing   What is the patient wearing?: Pull over shirt    Upper body assist Assist Level: Moderate Assistance - Patient 50 - 74%    Lower Body Dressing/Undressing Lower body dressing      What is the patient wearing?: Pants     Lower body assist Assist for lower body dressing: Maximal Assistance - Patient 25 - 49%     Toileting Toileting    Toileting assist Assist for toileting: Maximal Assistance - Patient 25 - 49% Assistive Device Comment: Urinal   Transfers Chair/bed transfer  Transfers assist  Chair/bed transfer activity did not occur: Safety/medical concerns  Chair/bed transfer assist level: Moderate Assistance - Patient 50 - 74%     Locomotion Ambulation   Ambulation assist   Ambulation activity did not occur: Safety/medical concerns  Assist level: 2 helpers Assistive device: Walker-rolling Max distance: 33 ft   Walk 10 feet activity   Assist  Walk 10 feet activity did not occur: Safety/medical concerns  Assist level: 2 helpers Assistive device: Walker-rolling   Walk 50 feet activity   Assist Walk 50 feet with 2 turns activity did not occur: Safety/medical concerns         Walk 150 feet activity   Assist Walk 150 feet activity did not occur: Safety/medical concerns  Walk 10 feet on uneven surface  activity   Assist Walk 10 feet on uneven surfaces activity did not occur: Safety/medical concerns   Assist level: 2 helpers     Wheelchair     Assist Will patient use wheelchair at discharge?: Yes Type of Wheelchair: Manual    Wheelchair assist level: Total Assistance - Patient < 25% Max wheelchair  distance: 5'    Wheelchair 50 feet with 2 turns activity    Assist        Assist Level: Total Assistance - Patient < 25%   Wheelchair 150 feet activity     Assist     Assist Level: Total Assistance - Patient < 25%    BP 110/70 (BP Location: Left Arm)   Pulse 89   Temp 98.4 F (36.9 C)   Resp 16   Ht 6' (1.829 m)   Wt (!) 145.7 kg   SpO2 98%   BMI 43.55 kg/m    Medical Problem List and Plan: 1.  Increased right side hemiparesis with dysarthria secondary to watershed infarct left ICA/MCA distribution  Continue CIR  WHO/PRAFO nightly 2.  Antithrombotics: -DVT/anticoagulation: Subcutaneous heparin             -antiplatelet therapy: Plavix 75 mg daily 3. Pain Management: Tylenol and Tramadol 50mg  as needed.  4. Mood: Team support             Antipsychotic agents: N/A 5. Neuropsych: This patient is capable of making decisions on his own behalf. 6. Skin/Wound Care: Routine skin checks. R knee blisters healing 7. Fluids/Electrolytes/Nutrition: Routine in and outs.   8.  CAD with stenting.  Continue Plavix. 9.  Hypertension.  Norvasc 10 mg daily, Coreg 6.25 mg twice daily, hydralazine 25 mg 3 times daily.    Controlled on 4/13             Monitor with increased mobility 10.  Hyperlipidemia.  Continue Lipitor 11.  AKI on CKD.  Creatinine baselin 1.60.               Creatinine 1.59 on 4/6, labs pending  Encourage fluids 12.  Morbid Obesity.  BMI 45.60.  Dietary follow-up. Encourage weight loss 13. Sleep disturbance             Remeron 7.5 mg nightly  Continue melatonin  Improving 14. Anemia:   Hemoglobin 12.6 on 4/8  Continue to monitor 15.  Right knee OA  Continue Voltaren gel   Improving  LOS: 13 days A FACE TO FACE EVALUATION WAS PERFORMED  Jennamarie Goings 6/8 03/29/2020, 8:30 AM

## 2020-03-29 NOTE — Progress Notes (Signed)
Speech Language Pathology Daily Session Note  Patient Details  Name: Zachary Mccann MRN: [id number] Date of Birth: 01-05-1971  Today's Date: 03/29/2020 SLP Individual Time: 0086-7619 SLP Individual Time Calculation (min): 28 min  Short Term Goals: Week 2: SLP Short Term Goal 1 (Week 2): STGs = LTGs d/t short ELOS  Skilled Therapeutic Interventions: Pt was seen for skilled ST targeting speech goals. Family education was scheduled for ST this afternoon, however pt's wife called and stated she had a family emergency and was unable to attend training today. SLP spoke with licensed social worker to initiate rescheduling family training.  Speech interventions targeted functional communication to express basic wants and needs and intelligibility of /cv/ and /cvc/ words containing /b/ and /p/. Pt required Moderate degree of models in order to correct phonemic errors and articulatory precision and to differentiate vowel sounds that occur at the end of words (ex: bee vs bow). SLP also created a new communication board for pt at his request (original board was missing) and also left pen and paper to assist with clarification of communication breakdowns with staff. Pt left sitting in wheelchair with alarm set, call bell within reach,  needs met to his satisfaction. Continue per current plan of care.       Pain Pain Assessment Pain Scale: 0-10 Pain Score: 0-No pain  Therapy/Group: Individual Therapy  Arbutus Leas 03/29/2020, 7:29 AM

## 2020-03-29 NOTE — Progress Notes (Signed)
Social Work Patient ID: Zachary Mccann, male   DOB: Sep 28, 1971, 49 y.o.   MRN: [id number] SW informed that family education scheduled for today was cancelled by patients family. SW followed up with family to see what days would work best for education. Significant other stated that Thursday or Friday would work best. Once therapy schedule is updated SW will call back with the best day for therapy and time options.

## 2020-03-29 NOTE — Progress Notes (Signed)
Occupational Therapy Session Note  Patient Details  Name: Zachary Mccann MRN: [id number] Date of Birth: [birth year]  Today's Date: 03/29/2020 OT Individual Time: 0240-9735 OT Individual Time Calculation (min): 70 min    Short Term Goals: Week 1:  OT Short Term Goal 1 (Week 1): Pt will be able to push from L sidelying to sit with mod A to prep for transfer. OT Short Term Goal 1 - Progress (Week 1): Not met OT Short Term Goal 2 (Week 1): Pt will be able to complete squat pivot to [facility] with max A of 1. OT Short Term Goal 2 - Progress (Week 1): Met OT Short Term Goal 3 (Week 1): Pt will be able to stand fully upright in stedy to allow caregivers to manage clothing over hips pre/post toileting. OT Short Term Goal 3 - Progress (Week 1): Met OT Short Term Goal 4 (Week 1): Pt will don shirt with min A. OT Short Term Goal 4 - Progress (Week 1): Not met OT Short Term Goal 5 (Week 1): Pt will perform self ROM with mod A. OT Short Term Goal 5 - Progress (Week 1): Met Week 2:  OT Short Term Goal 1 (Week 2): Pt will complete supine to sit with HOB elevated and overall mod assist. OT Short Term Goal 2 (Week 2): Pt will complete UB dressing with min assist following hemi dressing techniques. OT Short Term Goal 3 (Week 2): Pt will complete LB dressing with mod assist sit to stand. OT Short Term Goal 4 (Week 2): Pt will perform stand pivot transfer to the 3:1 with min assist.      Skilled Therapeutic Interventions/Progress Updates:    Pt seen this session to focus on functional transfers to prepare for his family ed session this afternoon.  Pt received in bed and he stated he had already been washed up last night and had clean pants on but wanted to change shirts.    Pt sat to EOB with min A and needed mod A with UB dressing due to poor recall of hemidressing techniques and challenges with donning over his shoulders.    Focused on forward lean, pushing up with L hand, placing L foot back slightly to  allow for a full push through L foot and R hand on RW.   Pt was able to rise from EOB 2x, BSC 3x, and w.c 3x all with min A to stand.  He practiced wc >< BSC and bed >< BSC for a total of 5 transfers all with min A to support his balance and min A to shift the RW as he is not able to pull it back on his R side to adjust positioning of the walker and/or has a R inattention.  He practiced upright standing with RW with CGA for 2 min and then 3 min.  At end of session ,pt brushed teeth at sink with set up.   Resting in wc with belt alarm on and all needs met.    Therapy Documentation Precautions:  Precautions Precautions: Fall Precaution Comments: R hemi Restrictions Weight Bearing Restrictions: No    Vital Signs: Therapy Vitals Temp: 98.4 F (36.9 C) Pulse Rate: 89 Resp: 16 BP: 110/70 Patient Position (if appropriate): Lying Oxygen Therapy SpO2: 98 % O2 Device: Room Air Pain: Pain Assessment Pain Scale: 0-10 Pain Score: 0-No pain  Therapy/Group: Individual Therapy  [place] 03/29/2020, 8:31 AM

## 2020-03-29 NOTE — Progress Notes (Signed)
Occupational Therapy Session Note  Patient Details  Name: Zachary Mccann MRN: [id number] Date of Birth: 09/29/1971  Today's Date: 03/29/2020 OT Individual Time: 0177-9390 OT Individual Time Calculation (min): 43 min    Short Term Goals: Week 2:  OT Short Term Goal 1 (Week 2): Pt will complete supine to sit with HOB elevated and overall mod assist. OT Short Term Goal 2 (Week 2): Pt will complete UB dressing with min assist following hemi dressing techniques. OT Short Term Goal 3 (Week 2): Pt will complete LB dressing with mod assist sit to stand. OT Short Term Goal 4 (Week 2): Pt will perform stand pivot transfer to the 3:1 with min assist.  Skilled Therapeutic Interventions/Progress Updates:    Pt transferred from the wheelchair to the therapy mat with min assist using the RW for support.  He then completed sit to stand and pre-gait standing from the elevated mat.  He was able to work on stepping forward, backwards, and to the side with the LUE while supporting himself with BUEs and use of the RW.  Decreased right knee control in stance phase with increased trunk flexion.  Mod facilitation was needed at times to maintain upright posture and provide support, especially when stepping to and from sideways to the left side.  He then worked in sitting on RUE shoulder flexion and reach with use of a therapy ball.  Mod facilitation was needed for bilateral shoulder flexion while holding the ball as well as when reaching for a target.  Finished session with transfer back to the wheelchair with min assist and transition back to the room via wheelchair.  Pt left in the wheelchair with the call button and phone in reach and safety belt in place.      Therapy Documentation Precautions:  Precautions Precautions: Fall Precaution Comments: R hemi Restrictions Weight Bearing Restrictions: No   Pain: Pain Assessment Pain Scale: 0-10 Pain Score: 0-No pain ADL: See Care Tool Section for some details of  mobility and selfcare  Therapy/Group: Individual Therapy  Hae Ahlers OTR/L 03/29/2020, 3:38 PM

## 2020-03-30 ENCOUNTER — Inpatient Hospital Stay (HOSPITAL_COMMUNITY): Payer: Medicaid Other | Admitting: Occupational Therapy

## 2020-03-30 ENCOUNTER — Inpatient Hospital Stay (HOSPITAL_COMMUNITY): Payer: Medicaid Other

## 2020-03-30 ENCOUNTER — Inpatient Hospital Stay (HOSPITAL_COMMUNITY): Payer: Medicaid Other | Admitting: Speech Pathology

## 2020-03-30 DIAGNOSIS — N1831 Chronic kidney disease, stage 3a: Secondary | ICD-10-CM

## 2020-03-30 DIAGNOSIS — R739 Hyperglycemia, unspecified: Secondary | ICD-10-CM

## 2020-03-30 NOTE — Plan of Care (Signed)
  Problem: RH BOWEL ELIMINATION Goal: RH STG MANAGE BOWEL WITH ASSISTANCE Description: STG Manage Bowel with min Assistance. Outcome: Progressing Goal: RH STG MANAGE BOWEL W/MEDICATION W/ASSISTANCE Description: STG Manage Bowel with Medication with min Assistance. Outcome: Progressing   Problem: RH SAFETY Goal: RH STG ADHERE TO SAFETY PRECAUTIONS W/ASSISTANCE/DEVICE Description: STG Adhere to Safety Precautions With cues/reminders Assistance/Device. Outcome: Progressing   Problem: RH KNOWLEDGE DEFICIT Goal: RH STG INCREASE KNOWLEDGE OF HYPERTENSION Description: Pt will demonstrate understanding of management for HTN with diet regimen and medication compliance with min assist using handouts/booklets prior to DC  Outcome: Progressing Goal: RH STG INCREASE KNOWLEGDE OF HYPERLIPIDEMIA Description: Pt will demonstrate understanding of management for HLD with diet regimen and medication compliance with min assist using handouts/booklets prior to DC  Outcome: Progressing Goal: RH STG INCREASE KNOWLEDGE OF STROKE PROPHYLAXIS Description: Pt will demonstrate understanding of management for stroke prevention with diet regimen and medication compliance with min assist using handouts/booklets prior to DC  Outcome: Progressing

## 2020-03-30 NOTE — Progress Notes (Signed)
Occupational Therapy Session Note  Patient Details  Name: Zachary Mccann MRN: [id number] Date of Birth: 1971-09-08  Today's Date: 03/30/2020 OT Individual Time: 7902-4097 OT Individual Time Calculation (min): 34 min    Short Term Goals: Week 2:  OT Short Term Goal 1 (Week 2): Pt will complete supine to sit with HOB elevated and overall mod assist. OT Short Term Goal 2 (Week 2): Pt will complete UB dressing with min assist following hemi dressing techniques. OT Short Term Goal 3 (Week 2): Pt will complete LB dressing with mod assist sit to stand. OT Short Term Goal 4 (Week 2): Pt will perform stand pivot transfer to the 3:1 with min assist.  Skilled Therapeutic Interventions/Progress Updates:    Pt was transferred down to the therapy gym with use of the wheelchair.  He then completed transfer to the therapy mat with mod assist sit to stand using the RW for support.  Mod demonstrational cueing was needed for sequencing sit to stand with max assist for reciprical scooting the right hip forward.  He worked on sit to stand transitions from the mat with min assist and mod demonstrational cueing.  Next, had pt transfer back to the wheelchair with min assist to return to the room.  Pt incontinent of stool so worked on standing at the sink for cleaning up of peri area and donning new brief.  Total assist needed to complete task.        Therapy Documentation Precautions:  Precautions Precautions: Fall Precaution Comments: R hemi Restrictions Weight Bearing Restrictions: No  Pain: Pain Assessment Pain Scale: Faces Pain Score: 0-No pain ADL: See Care Tool Section for some details of mobility and selfcare  Therapy/Group: Individual Therapy  Zachary Mccann OTR/L 03/30/2020, 4:20 PM

## 2020-03-30 NOTE — Patient Care Conference (Signed)
Inpatient RehabilitationTeam Conference and Plan of Care Update Date: 03/30/2020   Time: 8:12 PM    Patient Name: Zachary Mccann      Medical Record Number: [id number]  Date of Birth: 09/30/1971 Sex: Male         Room/Bed: 4M05C/4M05C-01 Payor Info: Payor: MEDICAID [place] / Plan: MEDICAID OF [place] / Product Type: *No Product type* /    Admit Date/Time:  03/16/2020  7:01 PM  Primary Diagnosis:  Cerebral infarction, watershed distribution, unilateral, chronic  Patient Active Problem List   Diagnosis Date Noted  . Hyperglycemia   . Anemia   . Acute pain of right knee   . Primary osteoarthritis of right knee   . Expressive language impairment   . Benign essential HTN   . Acute bilat watershed infarction [facility]) 03/16/2020  . Cerebral infarction, watershed distribution, unilateral, chronic 03/16/2020  . Acute renal failure ([place])   . History of CVA in adulthood   . Dyslipidemia   . CVA (cerebral vascular accident) ([place]) 03/11/2020  . Leukocytosis   . Hemiparesis affecting dominant side as late effect of stroke ([place])   . Labile blood pressure   . Sleep disturbance   . Benign hypertensive heart and kidney disease with diastolic CHF, NYHA class II and CKD stage III ([place])   . Chronic systolic congestive heart failure ([place])   . Morbid obesity ([place])   . Uncontrolled hypertension   . Dysphagia, post-stroke   . Cardiomegaly   . Acute systolic congestive heart failure ([place])   . Left middle cerebral artery stroke ([place]) 07/17/2019  . Cerebral embolism with cerebral infarction 07/11/2019  . Acute CVA (cerebrovascular accident) ([place]) 07/11/2019  . Slurred speech 07/10/2019  . Cardiomyopathy ([place]) 11/20/2018  . History of stroke 09/06/2018  . Tobacco use 09/06/2018  . History of non-ST elevation myocardial infarction (NSTEMI) 05/05/2018  . Severe Vitamin D deficiency 04/02/2018  . Community acquired pneumonia of left lower lobe of lung 04/02/2018  . CKD (chronic kidney disease), stage III ([place]) =  Secondary to Hypertension 04/02/2018  . Pneumonia 03/30/2018  . Metabolic acidosis 40/98/1191  . AKI (acute kidney injury) ([place]) 03/30/2018  . N&V (nausea and vomiting) 03/30/2018  . Essential hypertension 03/30/2018    Expected Discharge Date: Expected Discharge Date: 04/01/20  Team Members Present: Physician leading conference: Dr. Delice Lesch Care Coodinator Present: Karene Fry, RN, MSN;Christina Sampson Goon, BSW;Deborah Tobin Chad, RN, BSN, CRRN Nurse Present: Debroah Loop, RN PT Present: Drema Dallas Shagen, PT;Carr Zenia Resides, PT OT Present: Meriel Pica, OT SLP Present: Stormy Fabian, SLP PPS Coordinator present : Gunnar Fusi, Novella Olive, PT     Current Status/Progress Goal Weekly Team Focus  Bowel/Bladder   Continent with incontinent episodes of both, LBM 4/13  less incontinent episodes  monitor & assist as needed   Swallow/Nutrition/ Hydration             ADL's   He currently completes UB bathing at min assist and UB dressing at mod assist.  LB bathing is at mod assist sit to stand with mod assist for LB dressing.  Transfers are min to mod assist level with use of the RW to the tub bench and wheelchair.  He continues with baseline RUE functional use at a gross assist with supervision.  Min A sit to stand, toilet transfer, bathing, UB dressing; mod A LB dressing, toileting and dynamic stand balance  selfcare retraining, transfer training, balance retraining, neuromuscular re-education, DME education   Mobility   min/mod assist supine<>sit, min/mod  assist sit<>stand and stand pivot transfers using RW, gait up to 11ft using RW with min/mod assist and +2 w/c follow for safety  min assist bed mobility and transfers, mod assist car transfer, mod assist gait 12ft, supervision w/c propulsion 155ft  R LE strengthening and NMR, transfer training, gait training, bed mobility training, activity tolerance, standing balance, pt education   Communication   Mod-Max A multimodal cues  intellgibility at word level  Mod A for speech intelligibility at the word level (75% intelligible)  independence wtih use of speech intellgibility strategies word level   Safety/Cognition/ Behavioral Observations            Pain   no c/o pain on shift, has tylenol, tramadol prn & voltaren gel scheduled  pain scale <3/10  assess & treat as needed   Skin   1 blister left to the right knee, is intact & drying up, BLE nonpitting edema, MASD groin, buttocks, ITD abdominal fold, cracking skin to Bil feet  no new areas of skin break down  assess q shift    Rehab Goals Patient on target to meet rehab goals: Yes *See Care Plan and progress notes for long and short-term goals.     Barriers to Discharge  Current Status/Progress Possible Resolutions Date Resolved   Nursing  Incontinence               PT                    OT                  SLP                SW                Discharge Planning/Teaching Needs:  Goal is for patient is to discharge home with spouse to provide care.  Family education 03/29/20   Team Discussion: MD BS up yesterday, R knee pain, voltaren gel, repeat labs, BP controlled.  RN inc at times, A+O, BM 4/13.  PT min/mod bed and sit to stand, gait 45' min/mod RW, goals min/mod A.  OT transfers RW min A, wife helps with bathing, mod/max ADLs.  SLP max A goals, wants to go home Friday.   Revisions to Treatment Plan: N/A     Medical Summary Current Status: Increased right side hemiparesis with dysarthria secondary to watershed infarct left ICA/MCA distribution Weekly Focus/Goal: Improve mobility, right knee pain, BP  Barriers to Discharge: Medical stability;Decreased family/caregiver support;Weight;Incontinence   Possible Resolutions to Barriers: Therapies, optimize pain meds, follow labs   Continued Need for Acute Rehabilitation Level of Care: The patient requires daily medical management by a physician with specialized training in physical medicine and  rehabilitation for the following reasons: Direction of a multidisciplinary physical rehabilitation program to maximize functional independence : Yes Medical management of patient stability for increased activity during participation in an intensive rehabilitation regime.: Yes Analysis of laboratory values and/or radiology reports with any subsequent need for medication adjustment and/or medical intervention. : Yes   I attest that I was present, lead the team conference, and concur with the assessment and plan of the team.   Trish Mage 03/30/2020, 8:12 PM   Team conference was held via web/ teleconference due to COVID - 19

## 2020-03-30 NOTE — Progress Notes (Signed)
Physical Therapy Session Note  Patient Details  Name: Zachary Mccann MRN: [id number] Date of Birth: Jun 07, 1971  Today's Date: 03/30/2020 PT Individual Time: 0917-1030 PT Individual Time Calculation (min): 73 min   Short Term Goals: Week 2:  PT Short Term Goal 1 (Week 2): Pt will perform supine<>sit with mod assist consistently PT Short Term Goal 2 (Week 2): Pt wil perform sit<>stand with mod assist consistently PT Short Term Goal 3 (Week 2): Pt will perform stand pivot transfer using LRAD with mod assist consistently PT Short Term Goal 4 (Week 2): Pt will ambulate at least 37ft using RW with mod assist of 1 and +2 w/c follow if needed  Skilled Therapeutic Interventions/Progress Updates:     Pt received supine in bed and agreeable to therapy. Reports pain in R knee but improved from yesterday. Numerical rating not provided. PT provided mobility and repositioning for comfort.   Supine>sit with minA and use of bed features. PT providing assistance with RLE and cues for positioning of R hip for balance and safety. Stand step from elevated EOB with RW and modA for anterior weight shift and sequencing. WC transport to therapy gym for time management and energy conservation.  Pt ambulates bouts of 30' x2 with modA +1 and +2 WC follow. x1 bout of 30' with modA and without WC follow. Very short stance time on RLE due to pain but pt does demo reciprocal gait pattern with swing through technique and improved trunk extension. Pt performs 180 degree turns and and navigates around doorways and other obstacles.  Pt shoots basketball in standing with LUE with RUE support on RW and PT providing minA stability at hips. Pt is able to stand for 2-3 minutes at a time and does not have any LOBs. x4 bouts with seated rest breaks. PT cues for body mechanics to facilitate sit to stand and hand placement on RW for optimal safety and stability.  Pt performs x7 additional reps sit to stand with minA/modA from PT, with  cues for foot placement and anterior weight shift and manual facilitation at hips to complete.  Pt left seated in WC with alarm intact and all needs within reach.  Therapy Documentation Precautions:  Precautions Precautions: Fall Precaution Comments: R hemi Restrictions Weight Bearing Restrictions: No   Therapy/Group: Individual Therapy  Beau Fanny, PT, DPT 03/30/2020, 4:42 PM

## 2020-03-30 NOTE — Progress Notes (Signed)
Social Work Patient ID: Zachary Mccann, male   DOB: December 28, 1970, 49 y.o.   MRN: [id number] Significant other followed up with SW about family education. Wants to make sure she will have a babysitter then would prefer to give me a call back.

## 2020-03-30 NOTE — Plan of Care (Signed)
  Problem: RH Expression Communication Goal: LTG Patient will increase speech intelligibility (SLP) Description: LTG: Patient will increase speech intelligibility at word/phrase/conversation level with cues, % of the time (SLP) Flowsheets (Taken 03/30/2020 1047) LTG: Patient will increase speech intelligibility (SLP): Maximal Assistance - Patient 25 - 49% Level: Word Percent of time patient will use intelligible speech: 50% Note: Downgraded d/t slower than expected progress

## 2020-03-30 NOTE — Progress Notes (Signed)
Occupational Therapy Session Note  Patient Details  Name: Zachary Mccann MRN: [id number] Date of Birth: [birth year]  Today's Date: 03/30/2020 OT Individual Time: 1130-1200 OT Individual Time Calculation (min): 30 min    Short Term Goals: Week 1:  OT Short Term Goal 1 (Week 1): Pt will be able to push from L sidelying to sit with mod A to prep for transfer. OT Short Term Goal 1 - Progress (Week 1): Not met OT Short Term Goal 2 (Week 1): Pt will be able to complete squat pivot to [facility] with max A of 1. OT Short Term Goal 2 - Progress (Week 1): Met OT Short Term Goal 3 (Week 1): Pt will be able to stand fully upright in stedy to allow caregivers to manage clothing over hips pre/post toileting. OT Short Term Goal 3 - Progress (Week 1): Met OT Short Term Goal 4 (Week 1): Pt will don shirt with min A. OT Short Term Goal 4 - Progress (Week 1): Not met OT Short Term Goal 5 (Week 1): Pt will perform self ROM with mod A. OT Short Term Goal 5 - Progress (Week 1): Met Week 2:  OT Short Term Goal 1 (Week 2): Pt will complete supine to sit with HOB elevated and overall mod assist. OT Short Term Goal 2 (Week 2): Pt will complete UB dressing with min assist following hemi dressing techniques. OT Short Term Goal 3 (Week 2): Pt will complete LB dressing with mod assist sit to stand. OT Short Term Goal 4 (Week 2): Pt will perform stand pivot transfer to the 3:1 with min assist.      Skilled Therapeutic Interventions/Progress Updates:    Pt received in wc agreeable to therapy. Discussed with pt his challenges with carryover of techniques/ memory with strategies for improved mobility.  Pt agreeable to trying written instructions.  Wrote out the steps for sit to stand and pt was able to read them out loud.  Pt then read the instructions to himself, following each step well. He was able to rise to stand with only contact guard A a total of 5x.  +2 A from a PT (for safety) to facilitate pt ambulating with RW in  hall way for 10 ft x2 with only min A.  Each time he stood he read the directions and followed through without needing cues from therapist.  Pt participated well, resting in wc with belt alarm on and all needs met.   Therapy Documentation Precautions:  Precautions Precautions: Fall Precaution Comments: R hemi Restrictions Weight Bearing Restrictions: No       Pain: Pain Assessment Pain Scale: 0-10 Pain Score: 0-No pain   Therapy/Group: Individual Therapy  [place] 03/30/2020, 12:21 PM

## 2020-03-30 NOTE — Progress Notes (Signed)
Social Work Patient ID: Zachary Mccann, male   DOB: [birth year], 49 y.o.   MRN: [id number]  SW went in to give patient team conference update. Reports MD, Nursing, and therapy updates. Patient pleased. Informed patient of tentative discharge date of 04/01/20. Attempted to call significant other to provide information. Unable to leave voicemail due to mailbox being full, will continue to attempt. Therapy is recommending family education before discharge. Hopes of completing family education tomorrow 03/31/20, once significant other confirms. If no confirmation on family education tomorrow, discharge will have to be push backed. Sw will update of an questions/concerns.

## 2020-03-30 NOTE — Discharge Summary (Addendum)
Physician Discharge Summary  Patient ID: Zachary Mccann MRN: [id number] DOB/AGE: [birth year] 49 y.o.  Admit date: 03/16/2020 Discharge date: 04/01/2020  Discharge Diagnoses:  Principal Problem:   Cerebral infarction, watershed distribution, unilateral, chronic Active Problems:   Benign essential HTN   Expressive language impairment   Acute pain of right knee   Primary osteoarthritis of right knee   Anemia   Hyperglycemia DVT prophylaxis Hyperlipidemia CKD CAD with stenting Morbid obesity Tobacco abuse  Discharged Condition: Stable  Significant Diagnostic Studies: DG Knee 1-2 Views Right  Result Date: 03/15/2020 CLINICAL DATA:  Right knee pain and swelling EXAM: RIGHT KNEE - 1-2 VIEW COMPARISON:  None. FINDINGS: No fracture or malalignment. Mild to moderate medial and patellofemoral degenerative changes. Mild lateral joint space degenerative change. Trace knee effusion. IMPRESSION: No acute osseous abnormality. Tricompartment arthritis with trace knee effusion. Electronically Signed   By: [name] Pang M.D.   On: 03/15/2020 16:48   [name]  Result Date: 03/11/2020 CLINICAL DATA:  49 year old male code stroke presentation earlier today. Increased right side weakness. Ataxia. EXAM: MRI HEAD WITHOUT CONTRAST TECHNIQUE: Multiplanar, multiecho pulse sequences of the brain and surrounding structures were obtained without intravenous contrast. COMPARISON:  Head CT earlier today.  Brain MRI 07/11/2019. FINDINGS: Brain: No restricted diffusion or evidence of acute infarction. Progressed and now confluent central white matter T2 and FLAIR hyperintensity in the left hemisphere, with new abnormal signal tracking through the left deep gray nuclei and into the brainstem compatible with Wallerian degeneration (series 9, image 9). Stable superimposed chronic lacunar type infarcts in the right hemisphere, left cerebellum, left lower pons. No definite cortical encephalomalacia. Numerous  chronic microhemorrhages in the bilateral thalami and pons are stable. There is mild new microhemorrhage in the left parietal lobe. No midline shift, mass effect, evidence of mass lesion, ventriculomegaly, extra-axial collection or acute intracranial hemorrhage. Cervicomedullary junction and pituitary are within normal limits. Vascular: Chronically abnormal flow voids of both distal vertebral arteries and the left ICA siphon. Stable major vascular flow voids since July. Skull and upper cervical spine: Stable visible cervical spine, bone marrow signal. Sinuses/Orbits: Orbits are stable and negative. Chronic left maxillary sinusitis. Other: Trace new mastoid air cell fluid on the left. Chronic thickening of the adenoid soft tissues appears stable since July. Other Visible internal auditory structures appear normal. IMPRESSION: 1. Negative for acute infarct. 2. Positive for progressive deep watershed ischemia and development of left side Wallerian degeneration since July of last year. Underlying chronic multifocal intracranial vessel occlusion. 3. Stable superimposed advanced chronic small vessel disease in the deep gray nuclei, brainstem. Electronically Signed   By: Odessa Fleming M.D.   On: 03/11/2020 23:20   CT HEAD CODE STROKE WO CONTRAST  Result Date: 03/11/2020 CLINICAL DATA:  Code stroke. Ataxia, stroke suspected. Possible stroke, increased weakness at start today at 10 a.m., right-sided weakness from previous stroke. Patient reports weakness on left side. EXAM: CT HEAD WITHOUT CONTRAST TECHNIQUE: Contiguous axial images were obtained from the base of the skull through the vertex without intravenous contrast. COMPARISON:  Head CT 07/27/2019, brain MRI 07/11/2019. FINDINGS: Brain: The examination is mildly motion degraded. There is no evidence of acute intracranial hemorrhage. No acute demarcated cortical infarct is identified. No evidence of intracranial mass. No midline shift or extra-axial fluid collection.  Redemonstrated extensive chronic ischemic changes within the left cerebral white matter. Redemonstrated chronic lacunar infarct within the right corona radiata/basal ganglia. Also unchanged, there are chronic lacunar infarcts within the left cerebellar  hemisphere. Additional ill-defined hypoattenuation within the cerebral white matter is nonspecific, but consistent with chronic small vessel ischemic disease mild generalized parenchymal atrophy. Vascular: No hyperdense vessel.  Atherosclerotic calcifications. Skull: No calvarial fracture or suspicious osseous lesion. Sinuses/Orbits: Mucosal thickening within the dependent left maxillary sinus. No significant mastoid effusion. These results were called by telephone at the time of interpretation on 03/11/2020 at 1:00 pm to provider [facility] , who verbally acknowledged these results. IMPRESSION: No CT evidence of acute intracranial abnormality. Redemonstrated extensive chronic ischemic changes within the left cerebral white matter. Consider brain MRI to exclude acute on chronic ischemia in this region. Redemonstrated chronic lacunar infarcts within the right corona radiata/basal ganglia and left cerebellum. Background generalized parenchymal atrophy and chronic small vessel ischemic disease. Electronically Signed   By: Jackey Loge DO   On: 03/11/2020 13:00    Labs:  Basic Metabolic Panel: Recent Labs  Lab 03/29/20 0913  NA 137  K 4.1  CL 103  CO2 21*  GLUCOSE 131*  BUN 17  CREATININE 1.58*  CALCIUM 9.4    CBC: Recent Labs  Lab 03/31/20 0536  WBC 6.9  NEUTROABS 4.5  HGB 11.9*  HCT 36.0*  MCV 94.2  PLT 227    CBG: No results for input(s): GLUCAP in the last 168 hours.  Family history.  Mother with hypertension CVA and dementia.  Father hypertension CVA and unspecified cancer.  Denies any rectal colon or esophageal cancer  Brief HPI:   Zachary Mccann is a 49 y.o. right-handed male with history of hypertension, tobacco abuse, CAD  with non-STEMI 04/2018 as well as left ICA occlusion July 2020 with associated CVA causing right side weakness and slurred speech maintained on Plavix and received inpatient rehab services 07/17/2019 to 08/12/2019 discharge to home min mod assist level.  Patient lives with his girlfriend two-level home bed and bath on main level with ramped entrance.  Girlfriend did assist with ADLs.  They do have 6 children total in the home and reportedly 4 of them are handicapped.  Presented 03/11/2020 with increasing right side weakness.  MRI of the brain reviewed unremarkable for acute process.  Per report positive for progressive deep watershed ischemia and development of left side Wallerian degeneration since July of last year.  Admission chemistries creatinine 2.41 baseline 1.6, TSH 5.449, SARS coronavirus negative.  Neurology consulted maintained on Plavix for CVA prophylaxis.  Subcutaneous heparin for DVT prophylaxis.  Maintain on a regular diet.  Renal function stabilized with gentle IV fluids latest creatinine 1.47.  Therapy evaluations completed and patient was admitted for a comprehensive rehab program.   Hospital Course: Zachary Mccann was admitted to rehab 03/16/2020 for inpatient therapies to consist of PT, ST and OT at least three hours five days a week. Past admission physiatrist, therapy team and rehab RN have worked together to provide customized collaborative inpatient rehab.  Pertaining to patient's watershed infarct left ICA MCA distribution remained stable maintained on Plavix therapy he would follow-up neurology services.  Subcutaneous heparin for DVT prophylaxis no bleeding episodes.  Patient did have a history of CAD with stenting he remained on Plavix no chest pain or shortness of breath.  Blood pressure controlled on combination of Norvasc Coreg and hydralazine no orthostasis would follow-up with his primary MD.  Lipitor ongoing for hyperlipidemia.  CKD stable creatinine baseline 1.60 with latest  creatinine 1.58.  Morbid obesity BMI 45.60 dietary follow-up.  Sleep disturbance bouts of insomnia maintained on Remeron at night as well  as melatonin.  Patient did have some right knee osteoarthritis improved with the use of Voltaren gel.   Blood pressures were monitored on TID basis and controlled  He/ is continent of bowel and bladder.  He/ has made gains during rehab stay and is attending therapies  He/ will continue to receive follow up therapies   after discharge  Rehab course: During patient's stay in rehab weekly team conferences were held to monitor patient's progress, set goals and discuss barriers to discharge. At admission, patient required +2 physical assist sit to stand, moderate assist supine to sit, moderate assist for rolling.  Minimal assist upper body bathing max is lower body bathing minimal assist upper body dressing +2 physical assist lower body dressing  Physical exam.  Blood pressure 135/85 pulse 95 temperature 96 respirations 18 oxygen saturation 97% room air HEENT Head.  Normocephalic and atraumatic Eyes.  Pupils round and reactive to light no discharge.nystagmus Neck.  Supple nontender no JVD without thyromegaly Cardiac regular rate rhythm without any extra sounds or murmur heard Abdomen.  Soft nontender positive bowel sounds without rebound Respiratory effort normal no respiratory distress without wheeze Musculoskeletal. Normal range of motion Neurological.  Patient is alert dysarthric speech follows commands he does provide his name and age Motor.  Right upper extremity shoulder abduction 2 out of 5 distally 3- out of 5 Right lower extremity hip flexion knee extension 1+ out of 5 ankle dorsiflexion 2- out of 5 Left upper extremity left lower extremity 5 out of 5 proximal to distal sensation intact to light touch  He/  has had improvement in activity tolerance, balance, postural control as well as ability to compensate for deficits. He/ has had improvement in  functional use RUE/LUE  and RLE/LLE as well as improvement in awareness.  Sit to stand wheelchair rolling walker minimal assist for lifting and to stand primary facilitating increase anterior trunk weight shift.  Gait 10 feet outside on brick ground using rolling walker minimal assist for balance.  Patient demonstrates significant antalgic gait pattern with limited right weight shift.  Sit to stand rolling walker minimal assist into the wheelchair.  He was able to rise to stand with only contact-guard assist for ADLs needing min mod assist overall to complete tasks.  He was able to work on stepping forward backwards onto the side with left upper extremity while supporting himself with bilateral upper extremities and use of rolling walker.  Patient required total assist cues to control voicing during certain tasks for dysarthria and speech aware of deficits and attempts to increase speech intelligibility.  He was able to communicate basic needs.  Full family teaching completed discharged home       Disposition: Discharged to home    Diet: Regular  Special Instructions: No driving smoking or alcohol  Medications at discharge 1.  Tylenol as needed 2.  Norvasc 10 mg p.o. daily 3.  Lipitor 40 mg p.o. daily 4.  Coreg 6.25 mg p.o. twice daily 5.  Plavix 25 mg p.o. daily 6.  Voltaren gel 2 g 4 times a day to affected area 7.  Hydralazine 25 mg p.o. 3 times daily 8.  Melatonin 9 mg p.o. nightly 9.  Remeron 7.5 mg p.o. nightly 10.  Tramadol 50 mg every 8 hours as needed pain  Discharge Instructions     Ambulatory referral to Neurology   Complete by: As directed    An appointment is requested in approximately 4 weeks watershed infarction left ICA MCA distribution  Ambulatory referral to Physical Medicine Rehab   Complete by: As directed    Moderate complexity follow-up 1 to 2 weeks watershed/left ICA/MCA distribution infarction       Follow-up Information     Marcello Fennel, MD  Follow up.   Specialty: Physical Medicine and Rehabilitation Why: Office to call for appointment Contact information: [address] [place] [address] [phone]            Signed: Charlton Amor 04/01/2020, 5:23 AM Patient was seen, face-face, and physical exam performed by me on day of discharge, less than 30 minutes of total time spent.. Please see progress note from day of discharge as well.  Maryla Morrow, MD, ABPMR

## 2020-03-30 NOTE — Plan of Care (Signed)
  Problem: RH Wheelchair Mobility Goal: LTG Patient will propel w/c in controlled environment (PT) Description: LTG: Patient will propel wheelchair in controlled environment, # of feet with assist (PT) Outcome: Not Applicable Flowsheets (Taken 03/30/2020 0735) LTG: Pt will propel w/c in controlled environ  assist needed:: (goal dc'd 4/14 (pt ambulatory or wife transports in w/c)) -- Goal: LTG Patient will propel w/c in home environment (PT) Description: LTG: Patient will propel wheelchair in home environment, # of feet with assistance (PT). Outcome: Not Applicable Flowsheets (Taken 03/30/2020 0735) LTG: Pt will propel w/c in home environ  assist needed:: (goal dc'd 4/14 (pt ambulatory or wife transports in w/c)) --   Problem: Sit to Stand Goal: LTG:  Patient will perform sit to stand with assistance level (PT) Description: LTG:  Patient will perform sit to stand with assistance level (PT) Flowsheets (Taken 03/30/2020 0735) LTG: PT will perform sit to stand in preparation for functional mobility with assistance level: (goal downgraded due to lack of progress and knee pain limiting mobility 4/14) Moderate Assistance - Patient 50 - 74%   Problem: RH Ambulation Goal: LTG Patient will ambulate in controlled environment (PT) Description: LTG: Patient will ambulate in a controlled environment, # of feet with assistance (PT). Flowsheets (Taken 03/30/2020 0735) LTG: Pt will ambulate in controlled environ  assist needed:: (upgraded due to progress w/ ambulation 4/14) Minimal Assistance - Patient > 75%

## 2020-03-30 NOTE — Progress Notes (Addendum)
Speech Language Pathology Daily Session Note  Patient Details  Name: Zachary Mccann MRN: [id number] Date of Birth: 09-Oct-1971  Today's Date: 03/30/2020 SLP Individual Time: 0730-0820 SLP Individual Time Calculation (min): 50 min  Short Term Goals: Week 2: SLP Short Term Goal 1 (Week 2): STGs = LTGs d/t short ELOS  Skilled Therapeutic Interventions:  Skilled treatment session targeted pt's speech intelligibility goals. Pt required Total A cues to control voicing during blowing tasks and his CV words were not intelligible despite this level of cues. Pt is aware of deficits and attempts to increase speech intelligibility but during these attempts pt has increase motor planning deficits that is very difficult to overcome. Pt was left upright in bed, bed alarm on and all needs within reach. Continue per current plan of care.      Pain Pain Assessment Pain Scale: 0-10 Pain Score: 0-No pain  Therapy/Group: Individual Therapy  Blandon Offerdahl 03/30/2020, 10:55 AM

## 2020-03-30 NOTE — Progress Notes (Addendum)
[facility] PHYSICAL MEDICINE & REHABILITATION PROGRESS NOTE  Subjective/Complaints: Patient seen laying in bed this morning.  He indicates he slept well overnight.  He states he is "ready" to go home.  ROS: Denies CP, SOB, N/V/D  Objective: Vital Signs: Blood pressure 137/88, pulse 86, temperature 98.7 F (37.1 C), temperature source Oral, resp. rate 18, height 6' (1.829 m), weight (!) 145.4 kg, SpO2 98 %. No results found. No results for input(s): WBC, HGB, HCT, PLT in the last 72 hours. Recent Labs    03/29/20 0913  NA 137  K 4.1  CL 103  CO2 21*  GLUCOSE 131*  BUN 17  CREATININE 1.58*  CALCIUM 9.4    Physical Exam: BP 137/88 (BP Location: Left Arm)   Pulse 86   Temp 98.7 F (37.1 C) (Oral)   Resp 18   Ht 6' (1.829 m)   Wt (!) 145.4 kg   SpO2 98%   BMI 43.47 kg/m  Constitutional: No distress . Vital signs reviewed. HENT: Normocephalic.  Atraumatic. Eyes: EOMI. No discharge. Cardiovascular: No JVD. Respiratory: Normal effort.  No stridor. GI: Non-distended. Skin: Right knee blister healing Psych: Normal mood.  Normal behavior. Musc: Right lower extremity edema, stable Neurological: Alert Dysarthria, unchanged Follows commands.   Fair insight and awareness Motor: RUE: Shoulder abduction 2/5, elbow flexion/extension 3+-4 -/5, handgrip 3 -/5, unchanged RLE: HF, KE 2/5, ADF 3/5, improving  Assessment/Plan: 1. Functional deficits secondary to watershed infarct left ICA/MCA distribution which require 3+ hours per day of interdisciplinary therapy in a comprehensive inpatient rehab setting.  Physiatrist is providing close team supervision and 24 hour management of active medical problems listed below.  Physiatrist and rehab team continue to assess barriers to discharge/monitor patient progress toward functional and medical goals  Care Tool:  Bathing  Bathing activity did not occur: Safety/medical concerns Body parts bathed by patient: Chest, Abdomen, Left  upper leg, Left lower leg, Face, Right arm, Right upper leg, Right lower leg, Front perineal area   Body parts bathed by helper: Left arm, Buttocks Body parts n/a: Front perineal area, Buttocks(completed earlier with nursing per pt report)   Bathing assist Assist Level: Moderate Assistance - Patient 50 - 74%     Upper Body Dressing/Undressing Upper body dressing   What is the patient wearing?: Pull over shirt    Upper body assist Assist Level: Moderate Assistance - Patient 50 - 74%    Lower Body Dressing/Undressing Lower body dressing      What is the patient wearing?: Pants     Lower body assist Assist for lower body dressing: Maximal Assistance - Patient 25 - 49%     Toileting Toileting    Toileting assist Assist for toileting: Maximal Assistance - Patient 25 - 49% Assistive Device Comment: Urinal   Transfers Chair/bed transfer  Transfers assist  Chair/bed transfer activity did not occur: Safety/medical concerns  Chair/bed transfer assist level: Minimal Assistance - Patient > 75% Chair/bed transfer assistive device: Geologist, engineering   Ambulation assist   Ambulation activity did not occur: Safety/medical concerns  Assist level: 2 helpers(min A of 1 and +2 w/c follow) Assistive device: Walker-rolling Max distance: 58ft   Walk 10 feet activity   Assist  Walk 10 feet activity did not occur: Safety/medical concerns  Assist level: 2 helpers(min A of 1 and +2 w/c follow) Assistive device: Walker-rolling   Walk 50 feet activity   Assist Walk 50 feet with 2 turns activity did not occur: Safety/medical concerns  Walk 150 feet activity   Assist Walk 150 feet activity did not occur: Safety/medical concerns         Walk 10 feet on uneven surface  activity   Assist Walk 10 feet on uneven surfaces activity did not occur: Safety/medical concerns   Assist level: 2 helpers     Wheelchair     Assist Will patient use  wheelchair at discharge?: Yes Type of Wheelchair: Manual    Wheelchair assist level: Total Assistance - Patient < 25% Max wheelchair distance: 5'    Wheelchair 50 feet with 2 turns activity    Assist        Assist Level: Total Assistance - Patient < 25%   Wheelchair 150 feet activity     Assist     Assist Level: Total Assistance - Patient < 25%    BP 137/88 (BP Location: Left Arm)   Pulse 86   Temp 98.7 F (37.1 C) (Oral)   Resp 18   Ht 6' (1.829 m)   Wt (!) 145.4 kg   SpO2 98%   BMI 43.47 kg/m    Medical Problem List and Plan: 1.  Increased right side hemiparesis with dysarthria secondary to watershed infarct left ICA/MCA distribution  Continue CIR  WHO/PRAFO nightly  Team conference today to discuss current and goals and coordination of care, home and environmental barriers, and discharge planning with nursing, case manager, and therapies.  2.  Antithrombotics: -DVT/anticoagulation: Subcutaneous heparin             -antiplatelet therapy: Plavix 75 mg daily 3. Pain Management: Tylenol and Tramadol 50mg  as needed.  4. Mood: Team support             Antipsychotic agents: N/A 5. Neuropsych: This patient is capable of making decisions on his own behalf. 6. Skin/Wound Care: Routine skin checks. R knee blisters healing 7. Fluids/Electrolytes/Nutrition: Routine in and outs.   8.  CAD with stenting.  Continue Plavix. 9.  Hypertension.  Norvasc 10 mg daily, Coreg 6.25 mg twice daily, hydralazine 25 mg 3 times daily.    Controlled on 4/14             Monitor with increased mobility 10.  Hyperlipidemia.  Continue Lipitor 11.  CKD.  Creatinine baselin 1.60.               Creatinine 1.58 on 4/13  Encourage fluids 12.  Morbid Obesity.  BMI 45.60.  Dietary follow-up. Encourage weight loss 13. Sleep disturbance             Remeron 7.5 mg nightly  Continue melatonin  Improved 14. Anemia:   Hemoglobin 12.6 on 4/8, labs ordered for tomorrow  Continue to  monitor 15.  Right knee OA  Continue Voltaren gel   Improving 16.  Hyperglycemia  Elevated on 4/13,?  Postprandial  LOS: 14 days A FACE TO FACE EVALUATION WAS PERFORMED  Reema Chick Lorie Phenix 03/30/2020, 8:09 AM

## 2020-03-31 ENCOUNTER — Encounter (HOSPITAL_COMMUNITY): Payer: Medicaid Other | Admitting: Speech Pathology

## 2020-03-31 ENCOUNTER — Ambulatory Visit (HOSPITAL_COMMUNITY): Payer: Medicaid Other | Admitting: Physical Therapy

## 2020-03-31 ENCOUNTER — Ambulatory Visit (INDEPENDENT_AMBULATORY_CARE_PROVIDER_SITE_OTHER): Payer: Self-pay | Admitting: Nurse Practitioner

## 2020-03-31 ENCOUNTER — Inpatient Hospital Stay (HOSPITAL_COMMUNITY): Payer: Medicaid Other | Admitting: Occupational Therapy

## 2020-03-31 ENCOUNTER — Encounter (HOSPITAL_COMMUNITY): Payer: Medicaid Other | Admitting: Occupational Therapy

## 2020-03-31 ENCOUNTER — Ambulatory Visit (HOSPITAL_COMMUNITY): Payer: Medicaid Other | Admitting: *Deleted

## 2020-03-31 ENCOUNTER — Inpatient Hospital Stay (HOSPITAL_COMMUNITY): Payer: Medicaid Other | Admitting: Speech Pathology

## 2020-03-31 DIAGNOSIS — I6389 Other cerebral infarction: Secondary | ICD-10-CM

## 2020-03-31 LAB — CBC WITH DIFFERENTIAL/PLATELET
Abs Immature Granulocytes: 0.02 10*3/uL (ref 0.00–0.07)
Basophils Absolute: 0 10*3/uL (ref 0.0–0.1)
Basophils Relative: 1 %
Eosinophils Absolute: 0.1 10*3/uL (ref 0.0–0.5)
Eosinophils Relative: 2 %
HCT: 36 % — ABNORMAL LOW (ref 39.0–52.0)
Hemoglobin: 11.9 g/dL — ABNORMAL LOW (ref 13.0–17.0)
Immature Granulocytes: 0 %
Lymphocytes Relative: 21 %
Lymphs Abs: 1.4 10*3/uL (ref 0.7–4.0)
MCH: 31.2 pg (ref 26.0–34.0)
MCHC: 33.1 g/dL (ref 30.0–36.0)
MCV: 94.2 fL (ref 80.0–100.0)
Monocytes Absolute: 0.8 10*3/uL (ref 0.1–1.0)
Monocytes Relative: 12 %
Neutro Abs: 4.5 10*3/uL (ref 1.7–7.7)
Neutrophils Relative %: 64 %
Platelets: 227 10*3/uL (ref 150–400)
RBC: 3.82 MIL/uL — ABNORMAL LOW (ref 4.22–5.81)
RDW: 12.7 % (ref 11.5–15.5)
WBC: 6.9 10*3/uL (ref 4.0–10.5)
nRBC: 0 % (ref 0.0–0.2)

## 2020-03-31 MED ORDER — CARVEDILOL 6.25 MG PO TABS: 6.2500 mg | ORAL_TABLET | Freq: Two times a day (BID) | ORAL | 0 refills | Status: DC

## 2020-03-31 MED ORDER — MIRTAZAPINE 7.5 MG PO TABS: 7.5000 mg | ORAL_TABLET | Freq: Every day | ORAL | 0 refills | Status: DC

## 2020-03-31 MED ORDER — ATORVASTATIN CALCIUM 40 MG PO TABS: ORAL_TABLET | ORAL | 0 refills | Status: DC

## 2020-03-31 MED ORDER — ACETAMINOPHEN 325 MG PO TABS: 650.0000 mg | ORAL_TABLET | Freq: Four times a day (QID) | ORAL | Status: AC | PRN

## 2020-03-31 MED ORDER — DICLOFENAC SODIUM 1 % TD GEL: 2.0000 g | Freq: Four times a day (QID) | TRANSDERMAL | 0 refills | Status: DC

## 2020-03-31 MED ORDER — MELATONIN 10 MG PO TABS: 10.0000 mg | ORAL_TABLET | Freq: Every day | ORAL | 0 refills | Status: AC

## 2020-03-31 MED ORDER — CLOPIDOGREL BISULFATE 75 MG PO TABS: 75.0000 mg | ORAL_TABLET | Freq: Every day | ORAL | 3 refills | Status: DC

## 2020-03-31 MED ORDER — AMLODIPINE BESYLATE 10 MG PO TABS: 10.0000 mg | ORAL_TABLET | Freq: Every day | ORAL | 0 refills | Status: DC

## 2020-03-31 MED ORDER — TRAMADOL HCL 50 MG PO TABS: 50.0000 mg | ORAL_TABLET | Freq: Three times a day (TID) | ORAL | 0 refills | Status: DC | PRN

## 2020-03-31 MED ORDER — HYDRALAZINE HCL 25 MG PO TABS: 25.0000 mg | ORAL_TABLET | Freq: Three times a day (TID) | ORAL | 3 refills | Status: DC

## 2020-03-31 NOTE — Progress Notes (Signed)
Occupational Therapy Session Note  Patient Details  Name: Zachary Mccann MRN: [id number] Date of Birth: [birth year]  Today's Date: 03/31/2020 OT Individual Time: 8381-8403 OT Individual Time Calculation (min): 45 min    Short Term Goals: Week 1:  OT Short Term Goal 1 (Week 1): Pt will be able to push from L sidelying to sit with mod A to prep for transfer. OT Short Term Goal 1 - Progress (Week 1): Not met OT Short Term Goal 2 (Week 1): Pt will be able to complete squat pivot to [facility] with max A of 1. OT Short Term Goal 2 - Progress (Week 1): Met OT Short Term Goal 3 (Week 1): Pt will be able to stand fully upright in stedy to allow caregivers to manage clothing over hips pre/post toileting. OT Short Term Goal 3 - Progress (Week 1): Met OT Short Term Goal 4 (Week 1): Pt will don shirt with min A. OT Short Term Goal 4 - Progress (Week 1): Not met OT Short Term Goal 5 (Week 1): Pt will perform self ROM with mod A. OT Short Term Goal 5 - Progress (Week 1): Met Week 2:  OT Short Term Goal 1 (Week 2): Pt will complete supine to sit with HOB elevated and overall mod assist. OT Short Term Goal 2 (Week 2): Pt will complete UB dressing with min assist following hemi dressing techniques. OT Short Term Goal 3 (Week 2): Pt will complete LB dressing with mod assist sit to stand. OT Short Term Goal 4 (Week 2): Pt will perform stand pivot transfer to the 3:1 with min assist.      Skilled Therapeutic Interventions/Progress Updates:    Pt seen for BADL training and functional mobility skills.  Pt continues to need mod A with bed mobility to roll onto his side and push up to sitting.  Once sitting, he bathed UB with min A and guiding with hand over hand A to fully wash under L arm.  Did well with hemitechnique to don shirt with min A.  Pt completed sit to stand from bed to RW with min A to help push his pants off of hips with mod A. From sitting,  he was able to partially pull pants over R foot and then  placed his L foot into pants.   Min a to stand again and pt able to release L hand to pull pants over hips with mod A.    Sit to stand 3rd time to stand pivot to wc with Rw with min A.  Completed oral care at sink.    Pt set up with all needs met, belt alarm on.   Therapy Documentation Precautions:  Precautions Precautions: Fall Precaution Comments: R hemi Restrictions Weight Bearing Restrictions: No   Pain: Pain Assessment Pain Score: 0-No pain   Therapy/Group: Individual Therapy  Mikyle Sox 03/31/2020, 11:47 AM

## 2020-03-31 NOTE — Progress Notes (Signed)
Speech Language Pathology Discharge Summary  Patient Details  Name: Zachary Mccann MRN: [id number] Date of Birth: 1971-10-31  Today's Date: 03/31/2020 SLP Individual Time: 0730-0800 SLP Individual Time Calculation (min): 30 min   Skilled Therapeutic Interventions:   Skilled treatment session targeted pt's speech intelligibility goals. Specifically, SLP facilitated session by providing functional opportunities for pt to express his children's names and his sister's name. Pt's speech intelligibility was ~ 25% for common familiar names. SLP also facilitated session by providing questions about discharge such as who can call 911 if his wife is out of the house. Using multi-modal forms of communication, he was able to communicate that his sister is always present when his wife has to leave the house for appts etc.     Patient has met 1 of 1 long term goals.  Patient to discharge at overall Max level.   Reasons goals not met:   N/A  Clinical Impression/Discharge Summary:   Pt has made progress over the course of skilled ST. His speech intelligibility continues to be greatly decreased d/t apraxia and dysarthria. Specifically, he has difficulty with voiceless/voiced cognates, consonant clusters and anterior lingual movements. Despite maximal effort he is not able to change motor movements when they occur.    Care Partner:  Caregiver Able to Provide Assistance: Yes     Recommendation:  Outpatient SLP  Rationale for SLP Follow Up: Maximize functional communication;Reduce caregiver burden   Equipment:   N/A  Reasons for discharge: Discharged from hospital;Treatment goals met   Patient/Family Agrees with Progress Made and Goals Achieved: Yes    [name] 03/31/2020, 3:47 PM

## 2020-03-31 NOTE — Progress Notes (Signed)
Social Work Patient ID: Zachary Mccann, male   DOB: 25-Mar-1971, 49 y.o.   MRN: [id number] SW contacted significant other to inform her of the possibility of patient being set up with outpatient therapy. Due to needed all therapy disciplines patient was turned down by [facility]. Significant other agreed and is willing to transport patient to outpatient therapy.

## 2020-03-31 NOTE — Progress Notes (Signed)
Social Work Patient ID: Zachary Mccann, male   DOB: 29-Apr-1971, 49 y.o.   MRN: [id number] SW attempted to get patient set up with [facility] unable to take patient. Will continue to follow up

## 2020-03-31 NOTE — Discharge Instructions (Signed)
Inpatient Rehab Discharge Instructions  JAVELL BLACKBURN Discharge date and time: No discharge date for patient encounter.   Activities/Precautions/ Functional Status: Activity: activity as tolerated Diet: regular diet Wound Care: none needed Functional status:  ___ No restrictions     ___ Walk up steps independently ___ 24/7 supervision/assistance   ___ Walk up steps with assistance ___ Intermittent supervision/assistance  ___ Bathe/dress independently ___ Walk with walker     _x__ Bathe/dress with assistance ___ Walk Independently    ___ Shower independently ___ Walk with assistance    ___ Shower with assistance ___ No alcohol     ___ Return to work/school ________  COMMUNITY REFERRALS UPON DISCHARGE:    Outpatient: PT     OT    ST                 Agency: [facility] Phone: [phone]              Appointment Date/Time:PT evaluations: April 22nd 8:25 AM, OT evaluations: May 5th 11:00AM, ST evaluations: April 30th 11:45AM  Medical Equipment/Items Ordered: Has Tub Bench, [facility]                                                 Agency/Supplier: [employer] [phone]  Special Instructions: No driving smoking or alcohol STROKE/TIA DISCHARGE INSTRUCTIONS SMOKING Cigarette smoking nearly doubles your risk of having a stroke & is the single most alterable risk factor  If you smoke or have smoked in the last 12 months, you are advised to quit smoking for your health.  Most of the excess cardiovascular risk related to smoking disappears within a year of stopping.  Ask you doctor about anti-smoking medications  [place] Quit Line: 1-800-QUIT NOW  Free Smoking Cessation Classes [phone]  CHOLESTEROL Know your levels; limit fat & cholesterol in your diet  Lipid Panel     Component Value Date/Time   CHOL 117 10/01/2019 1112   TRIG 85 10/01/2019 1112   HDL 40 10/01/2019 1112   CHOLHDL 2.9 10/01/2019 1112   VLDL 23 07/11/2019 0732   [place] 60 10/01/2019 1112       Many patients benefit from treatment even if their cholesterol is at goal.  Goal: Total Cholesterol (CHOL) less than 160  Goal:  Triglycerides (TRIG) less than 150  Goal:  HDL greater than 40  Goal:  LDL (LDLCALC) less than 100   BLOOD PRESSURE American Stroke Association blood pressure target is less that 120/80 mm/Hg  Your discharge blood pressure is:  BP: 124/79  Monitor your blood pressure  Limit your salt and alcohol intake  Many individuals will require more than one medication for high blood pressure  DIABETES (A1c is a blood sugar average for last 3 months) Goal HGBA1c is under 7% (HBGA1c is blood sugar average for last 3 months)  Diabetes: No known diagnosis of diabetes    Lab Results  Component Value Date   HGBA1C 5.6 07/11/2019     Your HGBA1c can be lowered with medications, healthy diet, and exercise.  Check your blood sugar as directed by your physician  Call your physician if you experience unexplained or low blood sugars.  PHYSICAL ACTIVITY/REHABILITATION Goal is 30 minutes at least 4 days per week  Activity: Increase activity slowly, Therapies: Physical Therapy: Home Health Return to work:   Activity decreases your  risk of heart attack and stroke and makes your heart stronger.  It helps control your weight and blood pressure; helps you relax and can improve your mood.  Participate in a regular exercise program.  Talk with your doctor about the best form of exercise for you (dancing, walking, swimming, cycling).  DIET/WEIGHT Goal is to maintain a healthy weight  Your discharge diet is:  Diet Order            Diet Heart Room service appropriate? Yes; Fluid consistency: Thin  Diet effective now              liquids Your height is:    Your current weight is: Weight: (!) 146.5 kg Your Body Mass Index (BMI) is:     Following the type of diet specifically designed for you will help prevent another stroke.  Your goal weight range is:    Your  goal Body Mass Index (BMI) is 19-24.  Healthy food habits can help reduce 3 risk factors for stroke:  High cholesterol, hypertension, and excess weight.  RESOURCES Stroke/Support Group:  Call [phone]   STROKE EDUCATION PROVIDED/REVIEWED AND GIVEN TO PATIENT Stroke warning signs and symptoms How to activate emergency medical system (call 911). Medications prescribed at discharge. Need for follow-up after discharge. Personal risk factors for stroke. Pneumonia vaccine given:  Flu vaccine given:  My questions have been answered, the writing is legible, and I understand these instructions.  I will adhere to these goals & educational materials that have been provided to me after my discharge from the hospital.      My questions have been answered and I understand these instructions. I will adhere to these goals and the provided educational materials after my discharge from the hospital.  Patient/Caregiver Signature _______________________________ Date __________  Clinician Signature _______________________________________ Date __________  Please bring this form and your medication list with you to all your follow-up doctor's appointments.

## 2020-03-31 NOTE — Progress Notes (Signed)
Occupational Therapy Session Note  Patient Details  Name: Zachary Mccann MRN: [id number] Date of Birth: [birth year]  Today's Date: 03/31/2020 OT Individual Time: 9892-1194 OT Individual Time Calculation (min): 38 min    Short Term Goals: Week 2:  OT Short Term Goal 1 (Week 2): Pt will complete supine to sit with HOB elevated and overall mod assist. OT Short Term Goal 2 (Week 2): Pt will complete UB dressing with min assist following hemi dressing techniques. OT Short Term Goal 3 (Week 2): Pt will complete LB dressing with mod assist sit to stand. OT Short Term Goal 4 (Week 2): Pt will perform stand pivot transfer to the 3:1 with min assist.  Skilled Therapeutic Interventions/Progress Updates:    Pt's spouse present for family education during session.  Had her and pt practice toilet transfers to the 3:1 from the wheelchair.  Pt needing multiple attempts for sit to stand from the wheelchair with mod assist overall and spouse assisting.  Once standing, he was able to pivot around to the bedside commode with overall min to mod assist.  He completed sit to stand from the toilet with mod assist as well on two attempts.  He needed total assist for cleaning up bowel incontinence in standing as well and for changing his brief.  Feel pt will benefit from longer rehab stay as the transfers he completed with his wife were not at goal level of min assist.  She is open to him staying longer, but pt is declining to stay at conclusion of session.  Told them to discuss it and see how he does with PT family education directly after OT session.  Will continue to follow-up tomorrow.   Therapy Documentation Precautions:  Precautions Precautions: Fall Precaution Comments: R hemi Restrictions Weight Bearing Restrictions: No  Pain: Pain Assessment Pain Scale: Faces Pain Score: 0-No pain ADL: See Care Tool Section for some details of mobility and selfcare  Therapy/Group: Individual Therapy  Bronwen Pendergraft  OTR/L 03/31/2020, 3:41 PM

## 2020-03-31 NOTE — Progress Notes (Signed)
Recreational Therapy Session Note  Patient Details  Name: Zachary Mccann MRN: [id number] Date of Birth: [birth year] Today's Date: 03/31/2020 Time:  5909-3112 Pain: no c/o Skilled Therapeutic Interventions/Progress Updates: Session focused on activity tolerance, dynamic standing balance during co-treat with PT.  Pt performed sit stands using written instructions with mod assist.  Pt stood to perform modified fishing activity with min assist for balance with 1 UE support.  Pt initiated rest breaks appropriately and independently throughout the session.  Pt enjoyed activity.    Therapy/Group: Co-Treatment Lydiann Bonifas 03/31/2020, 1:07 PM

## 2020-03-31 NOTE — Progress Notes (Signed)
Physical Therapy Discharge Summary  Patient Details  Name: Zachary Mccann MRN: [id number] Date of Birth: 11-24-1971  Today's Date: 03/31/2020 PT Individual Time: 0347-4259 PT Individual Time Calculation (min): 35 min    Patient has met 8 of 8 long term goals due to improved activity tolerance, improved balance, improved postural control, increased strength, ability to compensate for deficits, functional use of  right upper extremity and right lower extremity, improved attention and improved awareness.  Patient to discharge at stand pivot transfer to/from w/c level using RW with Min Assist and occasional mod assist when pt fatigued.  Patient's care partner attended hands-on family training and is independent to provide the necessary physical and cognitive assistance at discharge.  All goals met.  Recommendation:  Patient will benefit from ongoing skilled PT services in outpatient setting to continue to advance safe functional mobility, address ongoing impairments in R hemiparesis, standing balance, activity tolerance, gait training, and minimize fall risk. Requires 24hr assistance at discharge - wife able to provide.  Equipment: No equipment provided as pt has all necessary DME  Reasons for discharge: treatment goals met and discharge from hospital  Patient/family agrees with progress made and goals achieved: Yes   Skilled Therapeutic Interventions/Progress Updates:  Pt received sitting in w/c with his wife present for hands-on family training/education. Therapist educated pt's wife on his current functional mobility level with need for assistance to perform functional transfers and bed mobility. Therapist educated pt/wife on using RW to perform stand pivot transfers to/from w/c with transferring towards the L when possible due to limited tolerance for R LE WBing due to R knee pain. Therapist demonstrated proper set-up of transfers and performed L stand pivot transfer w/c>EOB using RW with  min assist for lifting into standing and CGA/min assist for balance while turning - pt continues to demonstrate decreased R lateral weight shifting and decreased R LE WBing during transfer with antalgic pattern. Sit>supine, HOB flat and not using bedrails, with min assist for R LE management and cuing wife to ensure pt doing as much as possible. Pt performed supine<>sit with pt's wife demonstrating proper min assistance for R LE management in/out of the bed. R stand pivot transfer back to w/c using RW with pt's wife providing proper min assist for lifting into standing and balance while turning with min cuing from therapist. Pt's wife reports pt would often scoot too close to edge of bed resulting in him sliding off into the floor - therapist educated pt/wife on monitoring for this and not scooting as close if pt is standing from higher surface such as EOB.  Transported to/from gym in w/c for time management and energy conservation. Pt's wife confirms that she pushes him up/down ramp in w/c for home entry. Therapist educated pt/wife on proper sequencing of car transfer via full stand pivot with RW to/from w/c - pt/wife report they tend to use RW when pt not feeling as well so practiced transfer via this technique for safety. Stand pivot w/c<>simulated car (high van seat height) using RW with pt's wife providing proper min assist for lifting into stand and balance while turning - min cuing from therapist for sequencing. Therapist reinforced education regarding importance of performing stand pivot with RW and pt turning hips fully and stepping back to the car prior to initiating sit. Pt/wife demonstrate great understanding of training and pt's wife reports no questions/concerns regarding assisting pt safely at home. Transported back to room in w/c and pt left with needs in reach and  his wife present.  PT Discharge Precautions/Restrictions Precautions Precautions: Fall Precaution Comments: R  hemi Restrictions Weight Bearing Restrictions: No Pain Pain Assessment Pain Scale: 0-10 Pain Score: 0-No pain(Does not verbalize any pain but continues to demonstrate limited R LE WBing during transfers anticipate due to R knee pain) Vision/Perception  Perception Perception: Impaired Inattention/Neglect: Does not attend to right side of body Praxis Praxis: Impaired Praxis Impairment Details: Motor planning  Cognition Overall Cognitive Status: History of cognitive impairments - at baseline Arousal/Alertness: Awake/alert Orientation Level: Oriented X4 Memory: Impaired Awareness: Impaired Awareness Impairment: Anticipatory impairment Safety/Judgment: Impaired Comments: Poor awareness of deficits Sensation Sensation Light Touch: Appears Intact Hot/Cold: Not tested Proprioception: Impaired by gross assessment Stereognosis: Not tested Coordination Gross Motor Movements are Fluid and Coordinated: No Fine Motor Movements are Fluid and Coordinated: No Coordination and Movement Description: R hemiparesis, R knee pain limiting WBing during standing activities Motor  Motor Motor: Hemiplegia;Abnormal postural alignment and control Motor - Discharge Observations: Pre-existing RUE and RLE hemiparesis  Mobility Bed Mobility Bed Mobility: Supine to Sit;Sit to Supine Supine to Sit: Minimal Assistance - Patient > 75% Sit to Supine: Minimal Assistance - Patient > 75% Transfers Transfers: Sit to Stand;Stand to Sit;Stand Pivot Transfers Sit to Stand: Minimal Assistance - Patient > 75% Stand to Sit: Minimal Assistance - Patient > 75% Stand Pivot Transfers: Minimal Assistance - Patient > 75% Transfer (Assistive device): Rolling walker Locomotion  Gait Ambulation: Yes Gait Assistance: Minimal Assistance - Patient > 75%;2 Helpers(min A of 1 and +2 w/c follow - or required pre-determined seat planned ahead) Gait Distance (Feet): 30 Feet Assistive device: Rolling walker Gait Assistance  Details: Verbal cues for sequencing;Verbal cues for technique;Verbal cues for safe use of DME/AE;Verbal cues for precautions/safety;Verbal cues for gait pattern Gait Gait: Yes Gait Pattern: Impaired Gait Pattern: Antalgic;Trunk flexed;Poor foot clearance - right;Poor foot clearance - left;Wide base of support;Decreased weight shift to right;Decreased dorsiflexion - right;Decreased hip/knee flexion - right;Decreased stance time - right Stairs / Additional Locomotion Stairs: No Ramp: Other (comment)(pre-hospitalization: dependent w/c transport up/down ramp) Wheelchair Mobility Wheelchair Mobility: No(doesn't functionally propel w/c)  Trunk/Postural Assessment  Cervical Assessment Cervical Assessment: Within Functional Limits Thoracic Assessment Thoracic Assessment: Exceptions to WFL(rounded shoulders; L lean in standing) Lumbar Assessment Lumbar Assessment: Exceptions to WFL(posterior pelvic tilt) Postural Control Trunk Control: decreased in standing; bias to the L, poor midline orientation (partially due to R knee pain limiting R weightshift and WBing as well as R hemiparesis) Protective Responses: delayed and inadequate  Balance Balance Balance Assessed: Yes Static Sitting Balance Static Sitting - Balance Support: Feet supported Static Sitting - Level of Assistance: 5: Stand by assistance Dynamic Sitting Balance Dynamic Sitting - Balance Support: During functional activity Dynamic Sitting - Level of Assistance: 5: Stand by assistance Static Standing Balance Static Standing - Balance Support: During functional activity;Bilateral upper extremity supported Static Standing - Level of Assistance: 4: Min assist Dynamic Standing Balance Dynamic Standing - Balance Support: During functional activity;Bilateral upper extremity supported Dynamic Standing - Level of Assistance: 4: Min assist;3: Mod assist Extremity Assessment      RLE Assessment RLE Assessment: Exceptions to [facility] RLE  Strength Right Hip Flexion: 2/5 Right Knee Flexion: 2+/5 Right Knee Extension: 2/5 Right Ankle Dorsiflexion: 2/5 Right Ankle Plantar Flexion: 2/5 LLE Assessment LLE Assessment: Within Functional Limits    Tawana Scale, PT, DPT 03/31/2020, 4:04 PM

## 2020-03-31 NOTE — Progress Notes (Signed)
Social Work Patient ID: Zachary Mccann, male   DOB: September 11, 1971, 49 y.o.   MRN: [id number] SW attempted to get patient  set up with [facility], unable to accept patient due to no ST.

## 2020-03-31 NOTE — Progress Notes (Signed)
[place] PHYSICAL MEDICINE & REHABILITATION PROGRESS NOTE  Subjective/Complaints: Patient seen sitting up in bed this morning.  He indicates he did not sleep well overnight, but indicates no particular reason.  He is looking forward to hopefully being discharged tomorrow.  ROS: Denies CP, SOB, N/V/D  Objective: Vital Signs: Blood pressure 123/62, pulse 88, temperature 97.8 F (36.6 C), resp. rate 19, height 6' (1.829 m), weight (!) 146 kg, SpO2 95 %. No results found. Recent Labs    03/31/20 0536  WBC 6.9  HGB 11.9*  HCT 36.0*  PLT 227   Recent Labs    03/29/20 0913  NA 137  K 4.1  CL 103  CO2 21*  GLUCOSE 131*  BUN 17  CREATININE 1.58*  CALCIUM 9.4    Physical Exam: BP 123/62 (BP Location: Left Arm)   Pulse 88   Temp 97.8 F (36.6 C)   Resp 19   Ht 6' (1.829 m)   Wt (!) 146 kg   SpO2 95%   BMI 43.65 kg/m  Constitutional: No distress . Vital signs reviewed. HENT: Normocephalic.  Atraumatic. Eyes: EOMI. No discharge. Cardiovascular: No JVD. Respiratory: Normal effort.  No stridor. GI: Non-distended. Skin: Right knee blister healing Psych: Normal mood.  Normal behavior. Musc: Right lower extremity edema, stable Neurological: Alert Dysarthria, stable Follows commands.   Fair insight and awareness Motor: RUE: Shoulder abduction 2/5, elbow flexion/extension 3+-4 -/5, handgrip 3 -/5, stable RLE: HF, KE 2/5, ADF 3/5, stable  Assessment/Plan: 1. Functional deficits secondary to watershed infarct left ICA/MCA distribution which require 3+ hours per day of interdisciplinary therapy in a comprehensive inpatient rehab setting.  Physiatrist is providing close team supervision and 24 hour management of active medical problems listed below.  Physiatrist and rehab team continue to assess barriers to discharge/monitor patient progress toward functional and medical goals  Care Tool:  Bathing  Bathing activity did not occur: Safety/medical concerns Body parts  bathed by patient: Chest, Abdomen, Left upper leg, Left lower leg, Face, Right arm, Right upper leg, Right lower leg, Front perineal area   Body parts bathed by helper: Left arm, Buttocks Body parts n/a: Front perineal area, Buttocks(completed earlier with nursing per pt report)   Bathing assist Assist Level: Moderate Assistance - Patient 50 - 74%     Upper Body Dressing/Undressing Upper body dressing   What is the patient wearing?: Pull over shirt    Upper body assist Assist Level: Moderate Assistance - Patient 50 - 74%    Lower Body Dressing/Undressing Lower body dressing      What is the patient wearing?: Pants     Lower body assist Assist for lower body dressing: Maximal Assistance - Patient 25 - 49%     Toileting Toileting    Toileting assist Assist for toileting: Total Assistance - Patient < 25% Assistive Device Comment: Urinal   Transfers Chair/bed transfer  Transfers assist  Chair/bed transfer activity did not occur: Safety/medical concerns  Chair/bed transfer assist level: Moderate Assistance - Patient 50 - 74% Chair/bed transfer assistive device: Programmer, multimedia   Ambulation assist   Ambulation activity did not occur: Safety/medical concerns  Assist level: 2 helpers Assistive device: Walker-rolling Max distance: 30   Walk 10 feet activity   Assist  Walk 10 feet activity did not occur: Safety/medical concerns  Assist level: 2 helpers Assistive device: Walker-rolling   Walk 50 feet activity   Assist Walk 50 feet with 2 turns activity did not occur: Safety/medical concerns  Walk 150 feet activity   Assist Walk 150 feet activity did not occur: Safety/medical concerns         Walk 10 feet on uneven surface  activity   Assist Walk 10 feet on uneven surfaces activity did not occur: Safety/medical concerns   Assist level: 2 helpers     Wheelchair     Assist Will patient use wheelchair at discharge?:  Yes Type of Wheelchair: Manual    Wheelchair assist level: Total Assistance - Patient < 25% Max wheelchair distance: 5'    Wheelchair 50 feet with 2 turns activity    Assist        Assist Level: Total Assistance - Patient < 25%   Wheelchair 150 feet activity     Assist     Assist Level: Total Assistance - Patient < 25%    BP 123/62 (BP Location: Left Arm)   Pulse 88   Temp 97.8 F (36.6 C)   Resp 19   Ht 6' (1.829 m)   Wt (!) 146 kg   SpO2 95%   BMI 43.65 kg/m    Medical Problem List and Plan: 1.  Increased right side hemiparesis with dysarthria secondary to watershed infarct left ICA/MCA distribution  Continue CIR  WHO/PRAFO nightly 2.  Antithrombotics: -DVT/anticoagulation: Subcutaneous heparin             -antiplatelet therapy: Plavix 75 mg daily 3. Pain Management: Tylenol and Tramadol 50mg  as needed.  4. Mood: Team support             Antipsychotic agents: N/A 5. Neuropsych: This patient is capable of making decisions on his own behalf. 6. Skin/Wound Care: Routine skin checks. R knee blisters healing 7. Fluids/Electrolytes/Nutrition: Routine in and outs.   8.  CAD with stenting.  Continue Plavix. 9.  Hypertension.  Norvasc 10 mg daily, Coreg 6.25 mg twice daily, hydralazine 25 mg 3 times daily.    Controlled on 4/15             Monitor with increased mobility 10.  Hyperlipidemia.  Continue Lipitor 11.  CKD.  Creatinine baselin 1.60.               Creatinine 1.58 on 4/13  Encourage fluids 12.  Morbid Obesity.  BMI 45.60.  Dietary follow-up. Encourage weight loss 13. Sleep disturbance             Remeron 7.5 mg nightly  Continue melatonin  Improving overall 14. Anemia:   Hemoglobin 11.9 on 4/15  Continue to monitor 15.  Right knee OA  Continue Voltaren gel   Improving 16.  Hyperglycemia  Elevated on 4/13, likely postprandial  LOS: 15 days A FACE TO FACE EVALUATION WAS PERFORMED  Taleigh Gero 5/13 03/31/2020, 8:56 AM

## 2020-03-31 NOTE — Progress Notes (Signed)
Physical Therapy Session Note  Patient Details  Name: Zachary Mccann MRN: [id number] Date of Birth: [birth year]  Today's Date: 03/31/2020 PT Individual Time: 1015-1045 PT Individual Time Calculation (min): 30 min   Short Term Goals: Week 2:  PT Short Term Goal 1 (Week 2): Pt will perform supine<>sit with mod assist consistently PT Short Term Goal 2 (Week 2): Pt wil perform sit<>stand with mod assist consistently PT Short Term Goal 3 (Week 2): Pt will perform stand pivot transfer using LRAD with mod assist consistently PT Short Term Goal 4 (Week 2): Pt will ambulate at least 49ft using RW with mod assist of 1 and +2 w/c follow if needed  Skilled Therapeutic Interventions/Progress Updates:    Session focused on functional transfers with RW, sit <> Stands with RW, and dynamic standing balance with 1 UE support during meaningful fishing activity. Pt initially required several attempts and heavy mod assist to perform sit -> stand from w/c with RW (pt was following written instructions on his walker and directed PT to stand on R side). Other repetitions from slightly higher mat table only requires min assist for sit <> stands. Transfers required min to mod assist with cues for clearance and positioning of RLE as well as safe placement of RW. Pt maintains dynamic standing balance with CGA to min assist during fishing activity using LUE with fishing pole (excellent coordination) and RUE support on RW. Pt able to manipulate fish with R hand x 2 with min assist for balance. Pt directed when rest breaks needed for safety due to fatigue.   Therapy Documentation Precautions:  Precautions Precautions: Fall Precaution Comments: R hemi Restrictions Weight Bearing Restrictions: No   Pain:  4/10 pain in R knee - rest breaks as needed.    Therapy/Group: Individual Therapy and Co-Treatment with Rec Therapy  Karolee Stamps Darrol Poke, PT, DPT, CBIS  03/31/2020, 11:14 AM

## 2020-03-31 NOTE — Progress Notes (Signed)
Occupational Therapy Discharge Summary  Patient Details  Name: Zachary Mccann MRN: [id number] Date of Birth: 03-28-1971   Patient has met 5 of 10 long term goals due to improved activity tolerance, improved balance, postural control and ability to compensate for deficits.  Patient to discharge at overall Mod Assist level.  Patient's care partner is independent to provide the necessary physical and cognitive assistance at discharge.    Reasons goals not met: Pt continues to need mod assist for transfers and LB selfcare.  Total assist is needed for toileting tasks  Recommendation:  Patient will benefit from ongoing skilled OT services in outpatient setting to continue to advance functional skills in the area of BADL and Reduce care partner burden. Pt continues to be limited with RUE functional use as well as demonstrating decreased memory and decreased functional mobility.  His sit to stand continues to fluctuate from min to max assist as well making it difficulty to transfer him at times.  Recommend continued outpatient OT to further progress ADL function as well as increasing RUE functional use and movement as pt is currently Brunnstrum stage III-IV in the arm and hand.  Twenty four hour min to mod assist is recommended currently at discharge and spouse will provide.    Equipment: 3:1  Reasons for discharge: treatment goals met and discharge from hospital  Patient/family agrees with progress made and goals achieved: Yes  OT Discharge Precautions/Restrictions  Precautions Precautions: Fall Precaution Comments: R hemi Restrictions Weight Bearing Restrictions: No  Pain Pain Assessment Pain Scale: Faces Pain Score: 0-No pain ADL ADL Eating: Set up Where Assessed-Eating: Wheelchair Grooming: Setup Where Assessed-Grooming: Wheelchair Upper Body Bathing: Minimal assistance Where Assessed-Upper Body Bathing: Wheelchair Lower Body Bathing: Moderate assistance Where Assessed-Lower  Body Bathing: Wheelchair, Sitting at sink, Standing at sink Upper Body Dressing: Minimal assistance Where Assessed-Upper Body Dressing: Wheelchair Lower Body Dressing: Moderate assistance Where Assessed-Lower Body Dressing: Wheelchair, Standing at sink, Sitting at sink Toileting: Dependent Where Assessed-Toileting: Bedside Commode Toilet Transfer: Moderate assistance Toilet Transfer Method: Stand pivot Toilet Transfer Equipment: Bedside commode Vision Baseline Vision/History: No visual deficits Patient Visual Report: No change from baseline Vision Assessment?: No apparent visual deficits Perception  Perception: Impaired Inattention/Neglect: Does not attend to right side of body Praxis Praxis: Impaired Praxis Impairment Details: Motor planning Cognition Overall Cognitive Status: History of cognitive impairments - at baseline Arousal/Alertness: Awake/alert Orientation Level: Oriented X4 Memory: Impaired Memory Impairment: Decreased short term memory;Decreased recall of new information Awareness: Impaired Awareness Impairment: Anticipatory impairment Problem Solving: Impaired Problem Solving Impairment: Functional basic;Functional complex Safety/Judgment: Impaired Comments: Pt wanting to go home without good awareness of his current fluctuating level of assist.  Decreased ability to carryover sequencing from session to session with regards to selfcare and functional transfers. Sensation Sensation Light Touch: Not tested Hot/Cold: Not tested Proprioception: Not tested Stereognosis: Not tested Coordination Gross Motor Movements are Fluid and Coordinated: No Fine Motor Movements are Fluid and Coordinated: No Coordination and Movement Description: Pt with right hemiparesis at Brunstrum stage III-IV level in the hand and arm.  He needs mod facilitation for integration into selfcare tasks. Motor  Motor Motor: Hemiplegia;Abnormal postural alignment and control Motor - Discharge  Observations: Pre-existing RUE and RLE hemiparesis Mobility  Bed Mobility Supine to Sit: Maximal Assistance - Patient - Patient 25-49% Transfers Sit to Stand: Moderate Assistance - Patient 50-74% Stand to Sit: Moderate Assistance - Patient 50-74%  Trunk/Postural Assessment  Cervical Assessment Cervical Assessment: Within Functional Limits Thoracic Assessment Thoracic Assessment: Exceptions  to WFL(rounded posture, with left lean in standing) Lumbar Assessment Lumbar Assessment: Exceptions to WFL(posterior pelvic tilt)  Balance Balance Balance Assessed: Yes Static Sitting Balance Static Sitting - Balance Support: Feet supported Static Sitting - Level of Assistance: 5: Stand by assistance Dynamic Sitting Balance Dynamic Sitting - Balance Support: During functional activity Dynamic Sitting - Level of Assistance: 5: Stand by assistance Static Standing Balance Static Standing - Balance Support: During functional activity Static Standing - Level of Assistance: 4: Min assist Dynamic Standing Balance Dynamic Standing - Balance Support: During functional activity Dynamic Standing - Level of Assistance: 3: Mod assist Extremity/Trunk Assessment RUE Assessment RUE Assessment: Exceptions to [facility] General Strength Comments: Pt with history of right hemiparesis from previous CVA.  Currently, stage III-IV Brunnstrum for arm and hand.  He needs mod assist overall during bathing tasks to integrate into washing the left arm or other body parts within reach. LUE Assessment LUE Assessment: Within Functional Limits   Moise Friday OTR/L 03/31/2020, 5:08 PM

## 2020-04-01 DIAGNOSIS — I63512 Cerebral infarction due to unspecified occlusion or stenosis of left middle cerebral artery: Secondary | ICD-10-CM

## 2020-04-01 NOTE — Progress Notes (Signed)
Patient and spouse received discharge instructions from Deatra Ina, PA-C with verbal understanding. Patient was discharged to home with spouse and patient belongings. All questions asked and answered appropriately.

## 2020-04-01 NOTE — Progress Notes (Signed)
Social Work Discharge Note   The overall goal for the admission was met for:   Discharge location: Yes, Home  Length of Stay: Yes, 16 Days  Discharge activity level: Yes, Wheelchair level using Rolling Walker with MIN assist, MOD assist when fatigued.   Home/community participation: Yes  Services provided included: MD, RD, PT, OT, SLP, RN, CM, TR, Pharmacy, Neuropsych and SW  Financial Services: Medicaid  Follow-up services arranged: Outpatient: [facility]  Comments (or additional information):  Patient/Family verbalized understanding of follow-up arrangements: Yes  Individual responsible for coordination of the follow-up plan: Nori Riis [phone]  Confirmed correct DME delivered: Dyanne Iha 04/01/2020    Dyanne Iha

## 2020-04-01 NOTE — Progress Notes (Signed)
Recreational Therapy Discharge Summary Patient Details  Name: Zachary Mccann MRN: [id number] Date of Birth: 07/20/1971 Today's Date: 04/01/2020  Long term goals set: 1  Long term goals met: 1  Comments on progress toward goals: Pt met Min assist leisure goal and is ready for discharge home with family to provide 24 hour supervision/assistance.  TR sessions focused on activity analysis with potential modifications and leisure participation with emphasis on dynamic standing balance & recognizing need for rest.  Pt is excited about discharge and looking forward to modified leisure tasks at discharge.  Reasons for discharge: discharge from hospital Patient/family agrees with progress made and goals achieved: Yes  [name] 04/01/2020, 8:22 AM

## 2020-04-01 NOTE — Progress Notes (Signed)
[facility] PHYSICAL MEDICINE & REHABILITATION PROGRESS NOTE  Subjective/Complaints: Patient seen laying in bed this morning.  He indicates he slept well overnight.  He gives a thumbs up that he is ready for discharge.  ROS: Denies CP, SOB, N/V/D  Objective: Vital Signs: Blood pressure 123/73, pulse 79, temperature 97.7 F (36.5 C), temperature source Oral, resp. rate 17, height 6' (1.829 m), weight (!) 146 kg, SpO2 91 %. No results found. Recent Labs    03/31/20 0536  WBC 6.9  HGB 11.9*  HCT 36.0*  PLT 227   No results for input(s): NA, K, CL, CO2, GLUCOSE, BUN, CREATININE, CALCIUM in the last 72 hours.  Physical Exam: BP 123/73 (BP Location: Left Arm)   Pulse 79   Temp 97.7 F (36.5 C) (Oral)   Resp 17   Ht 6' (1.829 m)   Wt (!) 146 kg   SpO2 91%   BMI 43.65 kg/m  Constitutional: No distress . Vital signs reviewed. HENT: Normocephalic.  Atraumatic. Eyes: EOMI. No discharge. Cardiovascular: No JVD. Respiratory: Normal effort.  No stridor. GI: Non-distended. Skin: Right knee blister healing Psych: Normal mood.  Normal behavior. Musc: Right lower extremity edema stable Neurological: Alert Dysarthria, unchanged Follows commands.   Fair insight and awareness Motor: RUE: Shoulder abduction 2+/5, elbow flexion/extension 3+-4 -/5, handgrip 3 --3/5 RLE: HF, KE 2 +/5, ADF 3/5  Assessment/Plan: 1. Functional deficits secondary to watershed infarct left ICA/MCA distribution which require 3+ hours per day of interdisciplinary therapy in a comprehensive inpatient rehab setting.  Physiatrist is providing close team supervision and 24 hour management of active medical problems listed below.  Physiatrist and rehab team continue to assess barriers to discharge/monitor patient progress toward functional and medical goals  Care Tool:  Bathing  Bathing activity did not occur: Safety/medical concerns Body parts bathed by patient: Chest, Abdomen, Right arm, Face, Right lower  leg, Left lower leg, Right upper leg, Front perineal area   Body parts bathed by helper: Buttocks, Left upper leg, Left arm Body parts n/a: Front perineal area, Buttocks(completed earlier with nursing per pt report)   Bathing assist Assist Level: Moderate Assistance - Patient 50 - 74%     Upper Body Dressing/Undressing Upper body dressing   What is the patient wearing?: Pull over shirt    Upper body assist Assist Level: Minimal Assistance - Patient > 75%    Lower Body Dressing/Undressing Lower body dressing      What is the patient wearing?: Pants     Lower body assist Assist for lower body dressing: Moderate Assistance - Patient 50 - 74%     Toileting Toileting    Toileting assist Assist for toileting: Total Assistance - Patient < 25% Assistive Device Comment: Urinal   Transfers Chair/bed transfer  Transfers assist  Chair/bed transfer activity did not occur: Safety/medical concerns  Chair/bed transfer assist level: Minimal Assistance - Patient > 75% Chair/bed transfer assistive device: Armrests, Programmer, multimedia   Ambulation assist   Ambulation activity did not occur: Safety/medical concerns  Assist level: Minimal Assistance - Patient > 75% Assistive device: Walker-rolling Max distance: 8ft   Walk 10 feet activity   Assist  Walk 10 feet activity did not occur: Safety/medical concerns  Assist level: Minimal Assistance - Patient > 75% Assistive device: Walker-rolling   Walk 50 feet activity   Assist Walk 50 feet with 2 turns activity did not occur: Safety/medical concerns         Walk 150 feet activity  Assist Walk 150 feet activity did not occur: Safety/medical concerns         Walk 10 feet on uneven surface  activity   Assist Walk 10 feet on uneven surfaces activity did not occur: Safety/medical concerns(prior to hospitalization was dependent w/c transport up/down ramp)   Assist level: 2 helpers      Wheelchair     Assist Will patient use wheelchair at discharge?: No(w/c propulsion is not functional for pt - was dependent transport prior to hospitalization) Type of Wheelchair: Manual    Wheelchair assist level: Total Assistance - Patient < 25% Max wheelchair distance: 5'    Wheelchair 50 feet with 2 turns activity    Assist        Assist Level: Total Assistance - Patient < 25%   Wheelchair 150 feet activity     Assist     Assist Level: Total Assistance - Patient < 25%    BP 123/73 (BP Location: Left Arm)   Pulse 79   Temp 97.7 F (36.5 C) (Oral)   Resp 17   Ht 6' (1.829 m)   Wt (!) 146 kg   SpO2 91%   BMI 43.65 kg/m    Medical Problem List and Plan: 1.  Increased right side hemiparesis with dysarthria secondary to watershed infarct left ICA/MCA distribution  DC today  Will see patient for transitional care management in 1-2 weeks post-discharge  WHO/PRAFO nightly 2.  Antithrombotics: -DVT/anticoagulation: Subcutaneous heparin             -antiplatelet therapy: Plavix 75 mg daily 3. Pain Management: Tylenol and Tramadol 50mg  as needed.  4. Mood: Team support             Antipsychotic agents: N/A 5. Neuropsych: This patient is capable of making decisions on his own behalf. 6. Skin/Wound Care: Routine skin checks. R knee blisters healing. 7. Fluids/Electrolytes/Nutrition: Routine in and outs.   8.  CAD with stenting.  Continue Plavix. 9.  Hypertension.  Norvasc 10 mg daily, Coreg 6.25 mg twice daily, hydralazine 25 mg 3 times daily.    Controlled on 4/16             Monitor with increased mobility 10.  Hyperlipidemia.  Continue Lipitor 11.  CKD.  Creatinine baselin 1.60.               Creatinine 1.58 on 4/13  Encourage fluids 12.  Morbid Obesity.  BMI 45.60.  Dietary follow-up. Encourage weight loss 13. Sleep disturbance             Remeron 7.5 mg nightly  Continue melatonin  Improved 14. Anemia:   Hemoglobin 11.9 on 4/15  Continue to  monitor 15.  Right knee OA  Continue Voltaren gel   Improving 16.  Hyperglycemia  Elevated on 4/13, likely postprandial  LOS: 16 days A FACE TO FACE EVALUATION WAS PERFORMED  Zachary Mccann 5/13 04/01/2020, 10:10 AM

## 2020-04-04 ENCOUNTER — Telehealth: Payer: Self-pay | Admitting: *Deleted

## 2020-04-04 NOTE — Telephone Encounter (Signed)
Transitional care call 1st attempt  Attempted to contact patient care coordinator, [name], [phone].  Voice message stated that mailbox has not been setup, unable to leave message

## 2020-04-07 ENCOUNTER — Encounter: Payer: Medicaid Other | Attending: Physical Medicine & Rehabilitation | Admitting: Physical Medicine & Rehabilitation

## 2020-04-07 ENCOUNTER — Ambulatory Visit: Payer: Medicaid Other

## 2020-04-07 ENCOUNTER — Other Ambulatory Visit: Payer: Self-pay

## 2020-04-07 ENCOUNTER — Encounter: Payer: Self-pay | Admitting: Physical Medicine & Rehabilitation

## 2020-04-07 VITALS — BP 134/85 | HR 83 | Temp 97.5°F | Ht 72.0 in | Wt 321.8 lb

## 2020-04-07 DIAGNOSIS — F801 Expressive language disorder: Secondary | ICD-10-CM | POA: Diagnosis not present

## 2020-04-07 DIAGNOSIS — R269 Unspecified abnormalities of gait and mobility: Secondary | ICD-10-CM | POA: Diagnosis present

## 2020-04-07 DIAGNOSIS — I10 Essential (primary) hypertension: Secondary | ICD-10-CM

## 2020-04-07 DIAGNOSIS — I63232 Cerebral infarction due to unspecified occlusion or stenosis of left carotid arteries: Secondary | ICD-10-CM

## 2020-04-07 DIAGNOSIS — G479 Sleep disorder, unspecified: Secondary | ICD-10-CM | POA: Diagnosis present

## 2020-04-07 DIAGNOSIS — Z8673 Personal history of transient ischemic attack (TIA), and cerebral infarction without residual deficits: Secondary | ICD-10-CM | POA: Diagnosis not present

## 2020-04-07 DIAGNOSIS — I69322 Dysarthria following cerebral infarction: Secondary | ICD-10-CM | POA: Diagnosis not present

## 2020-04-07 NOTE — Progress Notes (Signed)
Subjective:    Patient ID: Zachary Mccann, male    DOB: 1971-12-13, 49 y.o.   MRN: [id number]  HPI Right-handed male with history of hypertension, tobacco abuse, CAD with non-STEMI 04/2018 as well as left ICA occlusion July 2020 with associated CVA causing right side weakness and slurred speech presents for hospital follow-up after receiving CIR for left ICA/MCA watershed infarcts.  Admit date: 03/16/2020 Discharge date: 04/01/2020  Patient with severe dysarthria, girlfriend not present. At discharge he was instructed to follow-up with neurology, with whom he has an appointment. Knee blister improving.  BP is controlled. Sleep is fair. He notes mild right knee pain.   DME: Bedside commode Mobility: Walker and wheelchair in the house Therapies: Have not started yet.   Pain Inventory Average Pain 4 Pain Right Now 6 My pain is intermittent, stabbing and aching  In the last 24 hours, has pain interfered with the following? General activity 0 Relation with others 0 Enjoyment of life 0 What TIME of day is your pain at its worst? morning Sleep (in general) Poor  Pain is worse with: walking, standing and some activites Pain improves with: rest and medication Relief from Meds: 5  Mobility use a walker use a wheelchair needs help with transfers  Function disabled: date disabled . I need assistance with the following:  dressing, bathing, toileting, meal prep, household duties and shopping  Neuro/Psych bladder control problems weakness tremor trouble walking spasms confusion depression anxiety  Prior Studies Any changes since last visit?  no  Physicians involved in your care Any changes since last visit?  no   Family History  Problem Relation Age of Onset  . Hypertension Mother   . Stroke Mother   . Dementia Mother   . Hypertension Father   . Stroke Father   . Cancer Father   . Alcoholism Father   . Cancer Sister    Social History   Socioeconomic History  .  Marital status: Single    Spouse name: Not on file  . Number of children: Not on file  . Years of education: Not on file  . Highest education level: Not on file  Occupational History  . Not on file  Tobacco Use  . Smoking status: Former Smoker    Types: Cigarettes  . Smokeless tobacco: Never Used  . Tobacco comment: smoked for 10-20 years at 1/2 ppd as of 2019  Substance and Sexual Activity  . Alcohol use: Not Currently  . Drug use: Not Currently  . Sexual activity: Yes  Other Topics Concern  . 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 Lives with girlfriend.   Social Determinants of Health   Financial Resource Strain:   . Difficulty of Paying Living Expenses:   Food Insecurity:   . Worried About Programme researcher, broadcasting/film/video in the Last Year:   . Barista in the Last Year:   Transportation Needs:   . Freight forwarder (Medical):   [name] Lack of Transportation (Non-Medical):   Physical Activity:   . Days of Exercise per Week:   . Minutes of Exercise per Session:   Stress:   . Feeling of Stress :   Social Connections:   . Frequency of Communication with Friends and Family:   . Frequency of Social Gatherings with Friends and Family:   . Attends Religious Services:   . Active Member of Clubs or  Organizations:   . Attends Archivist Meetings:   [name] Marital Status:    Past Surgical History:  Procedure Laterality Date  . CORONARY STENT INTERVENTION N/A 05/05/2018   Procedure: CORONARY STENT INTERVENTION;  Surgeon: Leonie Man, MD;  Location: [facility];  Service: Cardiovascular;  Laterality: N/A;  . LEFT HEART CATH AND CORONARY ANGIOGRAPHY N/A 05/05/2018   Procedure: LEFT HEART CATH AND CORONARY ANGIOGRAPHY;  Surgeon: Leonie Man, MD;  Location: [facility];  Service: Cardiovascular;  Laterality: N/A;  . TEE WITHOUT CARDIOVERSION N/A 09/08/2018    Procedure: TRANSESOPHAGEAL ECHOCARDIOGRAM (TEE);  Surgeon: Larey Dresser, MD;  Location: [facility] END[facility];  Service: Cardiovascular;  Laterality: N/A;   Past Medical History:  Diagnosis Date  . CAD (coronary artery disease)    a. s/p NSTEMI in 04/2018 with DES to LCx.   [name] Hypertension   . Myocardial infarction ([place])   . Pneumonia   . Stroke ([facility])    BP 134/85   Pulse 83   Temp (!) 97.5 F (36.4 C)   SpO2 95%   Opioid Risk Score:   Fall Risk Score:  `1  Depression screen PHQ 2/9  Depression screen PHQ 2/9 04/07/2020  Decreased Interest 3  Down, Depressed, Hopeless 3  PHQ - 2 Score 6  Altered sleeping 3  Tired, decreased energy 3  Change in appetite 0  Feeling bad or failure about yourself  0  Trouble concentrating 3  Moving slowly or fidgety/restless 3  Suicidal thoughts 0  PHQ-9 Score 18    Review of Systems  HENT: Negative.   Eyes: Negative.   Respiratory: Negative.   Cardiovascular: Positive for leg swelling.  Gastrointestinal: Positive for constipation.  Endocrine: Negative.   Genitourinary:       Bladder control  Musculoskeletal: Positive for gait problem.       Spasms  Skin: Negative.   Neurological: Positive for tremors and weakness.  Hematological: Bruises/bleeds easily.       Plavix  Psychiatric/Behavioral: Positive for confusion, decreased concentration and dysphoric mood. The patient is nervous/anxious.       Objective:   Physical Exam Constitutional: NAD. Vital signs reviewed. Psych: Appears to have Normal mood and normal behavior. Musc: Right lower extremity edema, improving Neurological: Alert Severe dysarthria Follows commands.   Fair insight and awareness Motor: RUE: Shoulder abduction 2+/5, elbow flexion/extension 3+-4 -/5, handgrip 3/5, improving RLE: HF, KE 2 +/5, ADF 3/5, stable    Assessment & Plan:  Right-handed male with history of hypertension, tobacco abuse, CAD with non-STEMI 04/2018 as well as left ICA occlusion July 2020 with  associated CVA causing right side weakness and slurred speech presents for hospital follow-up after receiving CIR for left ICA/MCA watershed infarcts.  1.  Increased right side hemiparesis with dysarthria secondary to watershed infarct left ICA/MCA distribution  Interaction limited due to severe dysarthria, with increased time asking yes/no questions  Initiate therapies  Follow up with Neurology  2.  Hypertension.    Continue meds  Controlled today  3. Sleep disturbance             Remeron 7.5 mg nightly             Increase melatonin to 3mg   4.  Right knee OA             Continue Voltaren gel              Will consider further intervention if no improvement  5.  Gait abnormality  Cont walker/wheelchair  Initiate therapies

## 2020-04-08 ENCOUNTER — Ambulatory Visit: Payer: Medicaid Other | Attending: Nurse Practitioner

## 2020-04-08 DIAGNOSIS — R471 Dysarthria and anarthria: Secondary | ICD-10-CM | POA: Diagnosis present

## 2020-04-08 DIAGNOSIS — R2689 Other abnormalities of gait and mobility: Secondary | ICD-10-CM | POA: Insufficient documentation

## 2020-04-08 DIAGNOSIS — R482 Apraxia: Secondary | ICD-10-CM | POA: Diagnosis present

## 2020-04-08 DIAGNOSIS — M6281 Muscle weakness (generalized): Secondary | ICD-10-CM | POA: Diagnosis present

## 2020-04-08 DIAGNOSIS — I69353 Hemiplegia and hemiparesis following cerebral infarction affecting right non-dominant side: Secondary | ICD-10-CM | POA: Insufficient documentation

## 2020-04-08 NOTE — Therapy (Signed)
[facility] [address] [place], [place], [address] Phone: [phone]   Fax:  [phone]  Physical Therapy Evaluation  Patient Details  Name: Zachary Mccann MRN: [id number] Date of Birth: 03-20-1971 Referring Provider (PT): Referred by Mariam Dollar but neurologist is Maryla Morrow   Encounter Date: 04/08/2020  PT End of Session - 04/08/20 1020    Visit Number  1    Number of Visits  11    Date for PT Re-Evaluation  06/07/20    Authorization Type  Medicaid    PT Start Time  0940   pt arrived late   PT Stop Time  1018    PT Time Calculation (min)  38 min    Equipment Utilized During Treatment  Gait belt   bariatric RW   Activity Tolerance  Patient tolerated treatment well    Behavior During Therapy  [facility] for tasks assessed/performed       Past Medical History:  Diagnosis Date  . CAD (coronary artery disease)    a. s/p NSTEMI in 04/2018 with DES to LCx.   [name] Hypertension   . Myocardial infarction (HCC)   . Pneumonia   . Stroke [facility])     Past Surgical History:  Procedure Laterality Date  . CORONARY STENT INTERVENTION N/A 05/05/2018   Procedure: CORONARY STENT INTERVENTION;  Surgeon: Marykay Lex, MD;  Location: [facility] INVASIVE CV LAB;  Service: Cardiovascular;  Laterality: N/A;  . LEFT HEART CATH AND CORONARY ANGIOGRAPHY N/A 05/05/2018   Procedure: LEFT HEART CATH AND CORONARY ANGIOGRAPHY;  Surgeon: Marykay Lex, MD;  Location: [facility] INVASIVE CV LAB;  Service: Cardiovascular;  Laterality: N/A;  . TEE WITHOUT CARDIOVERSION N/A 09/08/2018   Procedure: TRANSESOPHAGEAL ECHOCARDIOGRAM (TEE);  Surgeon: Laurey Morale, MD;  Location: [facility] ENDOSCOPY;  Service: Cardiovascular;  Laterality: N/A;    There were no vitals filed for this visit.   Subjective Assessment - 04/08/20 0945    Subjective  Pt was hospitalized after watershed ischemia left ICA/MCA distribution 3/26 and then inpatient rehab 3/31-/416. Pt's  girlfriend noted increased right hemipasesis. Pt had CVA in 2020 with right hemiparesis and dysarthric speech.  After that stroke he was walking about 10 steps with a walker to get to kitchen. He used wheelchair for most mobility. Girlfriend was always home to help with all ADLs. Pt did sponge baths with her help. Needed 1 person assist with transfers. Pt's girlfriend assisting with questions due to dysarthia. Pt reports speech about baseline but still having more weakness making transfers and such harder. 2 person assist to stand at walker.    Patient is accompained by:  --   girlfriend   Pertinent History  history of hypertension, tobacco abuse, CAD with non-STEMI 04/2018 as well as left ICA occlusion July 2020 with associated CVA causing right side weakness and slurred speech. New watershed 3/21    Currently in Pain?  Yes    Pain Score  6     Pain Location  Knee    Pain Orientation  Right    Pain Descriptors / Indicators  Aching    Pain Type  Chronic pain         [facility] PT Assessment - 04/08/20 0952      Assessment   Medical Diagnosis  CVA    Referring Provider (PT)  Referred by Mariam Dollar but neurologist is Maryla Morrow    Onset Date/Surgical Date  03/11/20    Hand Dominance  Left  Prior Therapy  Pt was in inpatient rehab with PT, OT, ST      Precautions   Precautions  Fall      Balance Screen   Has the patient fallen in the past 6 months  Yes    How many times?  1   slid from bed   Has the patient had a decrease in activity level because of a fear of falling?   Yes    Is the patient reluctant to leave their home because of a fear of falling?   Yes      Home Environment   Living Environment  Private residence    Living Arrangements  Other (Comment)   girlfriend and her 6 children   Available Help at Discharge  --   girlfriend   Type of Home  House    Home Access  Ramped entrance    Home Layout  Two level;Full bath on main level;Able to live on main level with  bedroom/bathroom    Home Equipment  Walker - 2 wheels;Wheelchair - manual;Bedside commode      Prior Function   Level of Independence  Needs assistance with ADLs;Needs assistance with transfers;Needs assistance with gait    Vocation  On disability    Leisure  fishing, eating      Cognition   Overall Cognitive Status  Impaired/Different from baseline    Memory  --   seems to be forgetting things a little more     Observation/Other Assessments   Observations  dysarthric speech making hard to understand but did well with yes/no and able to write if needed    Skin Integrity  Pt has some scabbing right proximal knee he reports due to hot pack in hospital. Also has some irritation on right wrist from wrist splint they issued as hospital. PT asked that they bring to OT eval for then to assess.      Sensation   Light Touch  Appears Intact      ROM / Strength   AROM / PROM / Strength  Strength      Strength   Strength Assessment Site  Shoulder;Elbow;Hand;Hip;Knee;Ankle    Right/Left Shoulder  Right;Left    Right Shoulder Flexion  2-/5    Left Shoulder Flexion  5/5    Right/Left Elbow  Right;Left    Right Elbow Flexion  3+/5    Right Elbow Extension  3+/5   within available range. Lacking ~30 degrees   Left Elbow Flexion  5/5    Left Elbow Extension  5/5    Right/Left hand  Right;Left    Right Hand Gross Grasp  Impaired    Left Hand Gross Grasp  Functional    Right/Left Hip  Right;Left    Right Hip Flexion  2-/5    Left Hip Flexion  4/5    Right/Left Knee  Right;Left    Right Knee Flexion  3+/5   seated   Right Knee Extension  2-/5    Left Knee Flexion  5/5    Left Knee Extension  5/5    Right/Left Ankle  Right;Left    Right Ankle Dorsiflexion  3+/5    Left Ankle Dorsiflexion  4+/5      Bed Mobility   Bed Mobility  Supine to Sit;Sit to Supine    Supine to Sit  Moderate Assistance - Patient 50-74%   at right leg from left sidelying   Sit to Supine  Moderate Assistance -  Patient 50-74%  at right leg     Transfers   Transfers  Sit to Stand;Stand to Sit;Lateral/Scoot Transfers;Stand Pivot Transfers    Sit to Stand  2: Max assist   +2 people to rise from mat   Sit to Stand Details  Verbal cues for technique    Sit to Stand Details (indicate cue type and reason)  Right hand placed on walker first.    Stand Pivot Transfers  4: Min assist    Stand Pivot Transfer Details (indicate cue type and reason)  mat to w/c to bariatric RW    Lateral/Scoot Transfers  3: Mod assist    Lateral/Scoot Transfer Details (indicate cue type and reason)  w/c to mat with multiple scoots to get over       Ambulation/Gait   Ambulation/Gait  Yes    Ambulation/Gait Assistance  4: Min assist    Ambulation/Gait Assistance Details  Pt able to advance right foot to turn from mat to w/c     Ambulation Distance (Feet)  2 Feet    Assistive device  Rolling walker   bariatric   Gait Pattern  Decreased hip/knee flexion - right;Decreased weight shift to right;Poor foot clearance - right    Ambulation Surface  Level;Indoor                Objective measurements completed on examination: See above findings.              PT Education - 04/08/20 1138    Education Details  PT plan of care    Person(s) Educated  Patient;Other (comment)   girlfriend   Methods  Explanation    Comprehension  Verbalized understanding       PT Short Term Goals - 04/08/20 1419      PT SHORT TERM GOAL #1   Title  Pt will be independent with initial HEP for strengthening, flexibility to continue with wife's assistance.    Baseline  no current HEP    Time  4    Period  Weeks    Status  New    Target Date  05/08/20      PT SHORT TERM GOAL #2   Title  Pt will be CGA with bed mobility for improved function in home.    Baseline  mod assist    Time  4    Period  Weeks    Status  New    Target Date  05/08/20      PT SHORT TERM GOAL #3   Title  Pt will be able to perform sit to stand from  wheelchair mod assist for improved function/functional strength at home.    Baseline  max assist +2    Time  4    Period  Weeks    Status  New    Target Date  05/08/20      PT SHORT TERM GOAL #4   Title  Pt will ambulate 75' with RW min assist for improved household mobility.    Baseline  2' min assist with stand/pivot transfer with RW    Time  4    Period  Weeks    Status  New    Target Date  05/08/20        PT Long Term Goals - 04/08/20 1423      PT LONG TERM GOAL #1   Title  Pt will be to perform progressive HEP with wife assist to continue gains on own.    Baseline  no  current HEP    Time  10    Period  Weeks    Status  New    Target Date  06/17/20      PT LONG TERM GOAL #2   Title  Pt will be able to perform transfers CGA for improved functional strength and mobility.    Baseline  max assist +2    Time  10    Period  Weeks    Status  New    Target Date  06/17/20      PT LONG TERM GOAL #3   Title  Pt will be able to ambulate >100' CGA with RW for further improvement in household mobility.    Baseline  2' min assist to transfer mat to w/c with RW    Time  10    Period  Weeks    Status  New    Target Date  06/17/20      PT LONG TERM GOAL #4   Title  Pt will be able to maintain standing x 5 min with supervision with light UE support on walker or counter for improved balance and participation with ADLs.    Baseline  CGA/Min assist to stand at [facility] briefly    Time  10    Period  Weeks    Status  New    Target Date  06/17/20             Plan - 04/08/20 1218    Clinical Impression Statement  Pt is 49 y/o male with history of CVA in 2020 who received no therapy as did not have insurance with recent watershed ischemis left ICA/MCA distribution. Pt has right hemiparesis and dysarthric speech. Pt's right hemiparesis has increased with recent occurrence. Pt is max assist of 2 people to perform sit to stand and able to perform stand pivot transfer with walker min  assist. Pt is mod assist with sit to/from supine at RLE. Pt will benefit from skilled PT to address strength, balance and functional mobility deficits.    Personal Factors and Comorbidities  Comorbidity 3+    Comorbidities  hypertension, tobacco abuse, CAD with non-STEMI 04/2018 as well as left ICA occlusion July 2020    Examination-Activity Limitations  Bathing;Hygiene/Grooming;Stairs;Bed Mobility;Locomotion Level;Stand;Toileting;Transfers    Examination-Participation Restrictions  Community Activity    Stability/Clinical Decision Making  Evolving/Moderate complexity    Clinical Decision Making  Moderate    Rehab Potential  Good    PT Frequency  1x / week    PT Duration  --   10 weeks plus eval   PT Treatment/Interventions  ADLs/Self Care Home Management;Moist Heat;Electrical Stimulation;Gait training;DME Instruction;Functional mobility training;Stair training;Neuromuscular re-education;Balance training;Therapeutic exercise;Therapeutic activities;Patient/family education;Orthotic Fit/Training;Vestibular;Passive range of motion;Manual techniques;Wheelchair mobility training    PT Next Visit Plan  Assess vitals to get baseline. Initiate supine/seated strengthening HEP. Bed mobility and transfers. Standing at walker.    Consulted and Agree with Plan of Care  Patient;Other (Comment)   girlfriend      Patient will benefit from skilled therapeutic intervention in order to improve the following deficits and impairments:  Abnormal gait, Decreased activity tolerance, Decreased balance, Decreased endurance, Decreased mobility, Decreased knowledge of use of DME, Decreased range of motion, Decreased strength, Impaired UE functional use, Pain  Visit Diagnosis: Other abnormalities of gait and mobility  Muscle weakness (generalized)  Hemiplegia and hemiparesis following cerebral infarction affecting right non-dominant side [facility])     Problem List Patient Active Problem List   Diagnosis Date Noted  .  Dysarthria, post-stroke 04/07/2020  . 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  . Hyperglycemia   . Anemia   . Acute pain of right knee   . Primary osteoarthritis of right knee   . Expressive language impairment   . Benign essential HTN   . Acute bilat watershed infarction [facility]) 03/16/2020  . Cerebral infarction, watershed distribution, unilateral, chronic 03/16/2020  . Acute renal failure ([place])   . History of CVA in adulthood   . Dyslipidemia   . CVA (cerebral vascular accident) ([place]) 03/11/2020  . Leukocytosis   . Hemiparesis affecting dominant side as late effect of stroke ([place])   . Labile blood pressure   . Sleep disturbance   . Benign hypertensive heart and kidney disease with diastolic CHF, NYHA class II and CKD stage III ([place])   . Chronic systolic congestive heart failure ([place])   . Morbid obesity ([place])   . Uncontrolled hypertension   . Dysphagia, post-stroke   . Cardiomegaly   . Acute systolic congestive heart failure ([place])   . Left middle cerebral artery stroke ([place]) 07/17/2019  . Cerebral embolism with cerebral infarction 07/11/2019  . Acute CVA (cerebrovascular accident) ([place]) 07/11/2019  . Slurred speech 07/10/2019  . Cardiomyopathy ([place]) 11/20/2018  . History of stroke 09/06/2018  . Tobacco use 09/06/2018  . History of non-ST elevation myocardial infarction (NSTEMI) 05/05/2018  . Severe Vitamin D deficiency 04/02/2018  . Community acquired pneumonia of left lower lobe of lung 04/02/2018  . CKD (chronic kidney disease), stage III ([place]) = Secondary to Hypertension 04/02/2018  . Pneumonia 03/30/2018  . Metabolic acidosis 67/20/9470  . AKI (acute kidney injury) ([place]) 03/30/2018  . N&V (nausea and vomiting) 03/30/2018  . Essential hypertension 03/30/2018    Electa Sniff, PT, DPT, NCS 04/08/2020, 2:28 PM  [facility] [address] [place], [place], [address] Phone: [phone]   Fax:  [phone]  Name:  KAYHAN BOARDLEY MRN: [id number] Date of Birth: Feb 24, 1971

## 2020-04-13 ENCOUNTER — Ambulatory Visit: Payer: Medicaid Other

## 2020-04-13 ENCOUNTER — Other Ambulatory Visit: Payer: Self-pay

## 2020-04-13 DIAGNOSIS — R2689 Other abnormalities of gait and mobility: Secondary | ICD-10-CM

## 2020-04-13 DIAGNOSIS — M6281 Muscle weakness (generalized): Secondary | ICD-10-CM

## 2020-04-13 NOTE — Therapy (Signed)
[facility] [address] [place], [place], [address] Phone: [phone]   Fax:  [phone]  Physical Therapy Treatment  Patient Details  Name: Zachary Mccann MRN: [id number] Date of Birth: 03-24-1971 Referring Provider (PT): Referred by Lauraine Rinne but neurologist is Delice Lesch   Encounter Date: 04/13/2020  PT End of Session - 04/13/20 1022    Visit Number  2    Number of Visits  11    Date for PT Re-Evaluation  06/07/20    Authorization Type  Medicaid    PT Start Time  1020    PT Stop Time  1058    PT Time Calculation (min)  38 min    Equipment Utilized During Treatment  Gait belt   bariatric RW   Activity Tolerance  Patient tolerated treatment well    Behavior During Therapy  [facility] for tasks assessed/performed       Past Medical History:  Diagnosis Date  . CAD (coronary artery disease)    a. s/p NSTEMI in 04/2018 with DES to LCx.   [name] Hypertension   . Myocardial infarction ([place])   . Pneumonia   . Stroke [facility])     Past Surgical History:  Procedure Laterality Date  . CORONARY STENT INTERVENTION N/A 05/05/2018   Procedure: CORONARY STENT INTERVENTION;  Surgeon: Leonie Man, MD;  Location: [place] CV LAB;  Service: Cardiovascular;  Laterality: N/A;  . LEFT HEART CATH AND CORONARY ANGIOGRAPHY N/A 05/05/2018   Procedure: LEFT HEART CATH AND CORONARY ANGIOGRAPHY;  Surgeon: Leonie Man, MD;  Location: [place] CV LAB;  Service: Cardiovascular;  Laterality: N/A;  . TEE WITHOUT CARDIOVERSION N/A 09/08/2018   Procedure: TRANSESOPHAGEAL ECHOCARDIOGRAM (TEE);  Surgeon: Larey Dresser, MD;  Location: [facility] ENDOSCOPY;  Service: Cardiovascular;  Laterality: N/A;    There were no vitals filed for this visit.  Subjective Assessment - 04/13/20 1022    Subjective  Pt denies any new issues.    Patient is accompained by:  --   girlfriend   Pertinent History  history of hypertension, tobacco abuse, CAD  with non-STEMI 04/2018 as well as left ICA occlusion July 2020 with associated CVA causing right side weakness and slurred speech. New watershed 3/21    Currently in Pain?  No/denies                       [facility] Adult PT Treatment/Exercise - 04/13/20 1040      Bed Mobility   Bed Mobility  Rolling Right;Rolling Left;Supine to Sit;Sit to Supine    Rolling Right  Supervision/verbal cueing    Rolling Left  Supervision/Verbal cueing    Left Sidelying to Sit  Minimal Assistance - Patient >75%    Sit to Supine  Moderate Assistance - Patient 50-74%      Transfers   Transfers  Sit to Stand;Stand to Sit;Stand Pivot Transfers    Sit to Stand  3: Mod assist   +2 from w/c mod assist, +1 mod/max assist from mat    Sit to Stand Details  Verbal cues for technique    Sit to Stand Details (indicate cue type and reason)  Pt needed +2 assist to rise from w/c. PT first cued to bring RLE back in better position then to push forward and lean. From elevated mat mod assist of 1 person with rehab tech standing by for safety with same verbal cuing performed x 3 then repeated with mat lowered x  3 with mod/max assist.    Stand to Sit  4: Min assist    Stand to Sit Details (indicate cue type and reason)  Verbal cues for technique    Stand to Sit Details  Pt was cued to reach back to control descent. Also to make sure he backs all the way up and feels chair on back of legs prior to sitting.    Stand Pivot Transfers  4: Min assist    Stand Pivot Transfer Details (indicate cue type and reason)  once up in standing utilized RW to transfer to w/c min assist. Pt was cued to keep weight forward and not lean back too soon.    Comments  BP=132/86 after transfers and exercises      Ambulation/Gait   Ambulation/Gait  Yes    Ambulation/Gait Assistance  4: Min assist    Ambulation/Gait Assistance Details  Pt was cued to stay up in walker and push down through arms    Ambulation Distance (Feet)  25 Feet     Assistive device  Rolling walker   bariatric, w/c follow of rehab tech   Gait Pattern  Decreased stance time - right;Decreased weight shift to right;Decreased hip/knee flexion - right;Right flexed knee in stance    Ambulation Surface  Level;Indoor      Exercises   Exercises  Other Exercises    Other Exercises   Supine right heel slides with shoe removed CGA in to flexion to keep hip in neutral and support under heel in to extension x 10 then repeated with PT holding right foot when bringing up in to flexion and then resisting hip extension x 10. Hooklying trying to maintain right hip in neutral with PT providing isometrics x 5 sec x 3 each direction. Pt had more difficultyt controlling adduction. Performed hooklying ball squeezes x 10 with 5 sec holds. Bridges with PT stabilizing lightly at right knee x 10 with verbal cues to squeeze bottom and try to get more lift on right. Pt able to barely clear right buttocks.             PT Education - 04/13/20 1112    Education Details  Started on initial strengthening HEP    Person(s) Educated  Patient   discussed with spouse at end of appointment in lobby   Methods  Explanation;Demonstration;Handout    Comprehension  Verbalized understanding;Returned demonstration       PT Short Term Goals - 04/08/20 1419      PT SHORT TERM GOAL #1   Title  Pt will be independent with initial HEP for strengthening, flexibility to continue with wife's assistance.    Baseline  no current HEP    Time  4    Period  Weeks    Status  New    Target Date  05/08/20      PT SHORT TERM GOAL #2   Title  Pt will be CGA with bed mobility for improved function in home.    Baseline  mod assist    Time  4    Period  Weeks    Status  New    Target Date  05/08/20      PT SHORT TERM GOAL #3   Title  Pt will be able to perform sit to stand from wheelchair mod assist for improved function/functional strength at home.    Baseline  max assist +2    Time  4    Period   Weeks  Status  New    Target Date  05/08/20      PT SHORT TERM GOAL #4   Title  Pt will ambulate 65' with RW min assist for improved household mobility.    Baseline  2' min assist with stand/pivot transfer with RW    Time  4    Period  Weeks    Status  New    Target Date  05/08/20        PT Long Term Goals - 04/08/20 1423      PT LONG TERM GOAL #1   Title  Pt will be to perform progressive HEP with wife assist to continue gains on own.    Baseline  no current HEP    Time  10    Period  Weeks    Status  New    Target Date  06/17/20      PT LONG TERM GOAL #2   Title  Pt will be able to perform transfers CGA for improved functional strength and mobility.    Baseline  max assist +2    Time  10    Period  Weeks    Status  New    Target Date  06/17/20      PT LONG TERM GOAL #3   Title  Pt will be able to ambulate >100' CGA with RW for further improvement in household mobility.    Baseline  2' min assist to transfer mat to w/c with RW    Time  10    Period  Weeks    Status  New    Target Date  06/17/20      PT LONG TERM GOAL #4   Title  Pt will be able to maintain standing x 5 min with supervision with light UE support on walker or counter for improved balance and participation with ADLs.    Baseline  CGA/Min assist to stand at [facility] briefly    Time  10    Period  Weeks    Status  New    Target Date  06/17/20            Plan - 04/13/20 1113    Clinical Impression Statement  Pt did well with rolling today with only supervision and occasional verbal cue. Pt was started on initial strengthening HEP for right leg. Pt showed improved transfer ability from mat today after practice with less assist.    Personal Factors and Comorbidities  Comorbidity 3+    Comorbidities  hypertension, tobacco abuse, CAD with non-STEMI 04/2018 as well as left ICA occlusion July 2020    Examination-Activity Limitations  Bathing;Hygiene/Grooming;Stairs;Bed Mobility;Locomotion  Level;Stand;Toileting;Transfers    Examination-Participation Restrictions  Community Activity    Stability/Clinical Decision Making  Evolving/Moderate complexity    Rehab Potential  Good    PT Frequency  1x / week    PT Duration  --   10 weeks plus eval   PT Treatment/Interventions  ADLs/Self Care Home Management;Moist Heat;Electrical Stimulation;Gait training;DME Instruction;Functional mobility training;Stair training;Neuromuscular re-education;Balance training;Therapeutic exercise;Therapeutic activities;Patient/family education;Orthotic Fit/Training;Vestibular;Passive range of motion;Manual techniques;Wheelchair mobility training    PT Next Visit Plan  How is initial HEP going? Continue with transfers, bed mobility, standing and gait.    Consulted and Agree with Plan of Care  Patient       Patient will benefit from skilled therapeutic intervention in order to improve the following deficits and impairments:  Abnormal gait, Decreased activity tolerance, Decreased balance, Decreased endurance, Decreased mobility, Decreased knowledge  of use of DME, Decreased range of motion, Decreased strength, Impaired UE functional use, Pain  Visit Diagnosis: Other abnormalities of gait and mobility  Muscle weakness (generalized)     Problem List Patient Active Problem List   Diagnosis Date Noted  . Dysarthria, post-stroke 04/07/2020  . 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  . Hyperglycemia   . Anemia   . Acute pain of right knee   . Primary osteoarthritis of right knee   . Expressive language impairment   . Benign essential HTN   . Acute bilat watershed infarction [facility]) 03/16/2020  . Cerebral infarction, watershed distribution, unilateral, chronic 03/16/2020  . Acute renal failure (HCC)   . History of CVA in adulthood   . Dyslipidemia   . CVA (cerebral vascular accident) (HCC) 03/11/2020  . Leukocytosis   . Hemiparesis affecting dominant side as late effect of stroke (HCC)   . Labile blood  pressure   . Sleep disturbance   . Benign hypertensive heart and kidney disease with diastolic CHF, NYHA class II and CKD stage III (HCC)   . Chronic systolic congestive heart failure (HCC)   . Morbid obesity (HCC)   . Uncontrolled hypertension   . Dysphagia, post-stroke   . Cardiomegaly   . Acute systolic congestive heart failure (HCC)   . 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 Cerebral embolism with cerebral infarction 07/11/2019  . Acute CVA (cerebrovascular accident) (HCC) 07/11/2019  . Slurred speech 07/10/2019  . Cardiomyopathy (HCC) 11/20/2018  . History of stroke 09/06/2018  . Tobacco use 09/06/2018  . History of non-ST elevation myocardial infarction (NSTEMI) 05/05/2018  . Severe Vitamin D deficiency 04/02/2018  . Community acquired pneumonia of left lower lobe of lung 04/02/2018  . CKD (chronic kidney disease), stage III (HCC) = Secondary to Hypertension 04/02/2018  . Pneumonia 03/30/2018  . Metabolic acidosis 03/30/2018  . AKI (acute kidney injury) (HCC) 03/30/2018  . N&V (nausea and vomiting) 03/30/2018  . Essential hypertension 03/30/2018    Ronn Melena, PT, DPT, NCS 04/13/2020, 11:16 AM  [facility] [address] [place], [place], [address] Phone: [phone]   Fax:  [phone]  Name: Zachary Mccann MRN: [id number] Date of Birth: May 14, 1971

## 2020-04-13 NOTE — Patient Instructions (Signed)
Access Code: [email or web address] URL: [email or web address] Date: 04/13/2020 Prepared by: Elmer Bales  Exercises Supine Heel Slide - 2 x daily - 5 x weekly - 2 sets - 10 reps Supine Hip Adduction Isometric with Ball - 2 x daily - 5 x weekly - 2 sets - 10 reps - 5 sec hold Supine Bridge - 1 x daily - 5 x weekly - 2 sets - 10 reps

## 2020-04-15 ENCOUNTER — Ambulatory Visit: Payer: Medicaid Other

## 2020-04-15 ENCOUNTER — Other Ambulatory Visit: Payer: Self-pay

## 2020-04-15 DIAGNOSIS — R482 Apraxia: Secondary | ICD-10-CM

## 2020-04-15 DIAGNOSIS — R471 Dysarthria and anarthria: Secondary | ICD-10-CM

## 2020-04-15 DIAGNOSIS — R2689 Other abnormalities of gait and mobility: Secondary | ICD-10-CM | POA: Diagnosis not present

## 2020-04-15 NOTE — Therapy (Signed)
[facility] [address] [place], [place], [address] Phone: [phone]   Fax:  [phone]  Speech Language Pathology Treatment  Patient Details  Name: Zachary Mccann MRN: [id number] Date of Birth: [birth year] Referring Provider (SLP): Maryla Mor[facility], MD   Encounter Date: 04/15/2020  End of Session - 04/15/20 1349    Visit Number  1    Number of Visits  5    Date for SLP Re-Evaluation  05/27/20    Activity Tolerance  Patient tolerated treatment well       Past Medical History:  Diagnosis Date  . CAD (coronary artery disease)    a. s/p NSTEMI in 04/2018 with DES to LCx.   [name] Hypertension   . Myocardial infarction (HCC)   . Pneumonia   . Stroke [facility])     Past Surgical History:  Procedure Laterality Date  . CORONARY STENT INTERVENTION N/A 05/05/2018   Procedure: CORONARY STENT INTERVENTION;  Surgeon: Marykay Lex, MD;  Location: [facility] INVASIVE CV LAB;  Service: Cardiovascular;  Laterality: N/A;  . LEFT HEART CATH AND CORONARY ANGIOGRAPHY N/A 05/05/2018   Procedure: LEFT HEART CATH AND CORONARY ANGIOGRAPHY;  Surgeon: Marykay Lex, MD;  Location: [facility] INVASIVE CV LAB;  Service: Cardiovascular;  Laterality: N/A;  . TEE WITHOUT CARDIOVERSION N/A 09/08/2018   Procedure: TRANSESOPHAGEAL ECHOCARDIOGRAM (TEE);  Surgeon: Laurey Morale, MD;  Location: [facility] ENDOSCOPY;  Service: Cardiovascular;  Laterality: N/A;    There were no vitals filed for this visit.  Subjective Assessment - 04/15/20 1207    Subjective  (unintelligble)    Currently in Pain?  Yes    Pain Score  6     Pain Location  Knee    Pain Orientation  Right    Pain Descriptors / Indicators  Aching    Pain Type  Chronic pain        SLP Evaluation [facility] - 04/15/20 1207      SLP Visit Information   SLP Received On  04/15/20    Referring Provider (SLP)  Maryla Morrow, MD    Onset Date  MArch 2021    Medical Diagnosis  CVA      Subjective   Patient/Family Stated Goal  "P's and B's" (pt would like to articulate P and B)      General Information   HPI  Pt with lt ICA occlusion July 2020 with CVA with resulting dysarthria with reportedly very little reduction in intelligibilty. In March 202a pt with incr'd rt hemiparesis -MRI ID'd deep watershed ischemia and lt side Wallerian degeneration new since July 2020.      Prior Functional Status   Cognitive/Linguistic Baseline  Within functional limits    Type of Home  House     Lives With  Significant other    Vocation  On disability      Cognition   Overall Cognitive Status  Impaired/Different from baseline   "thinking is slower" (S-L-O-W on alpha pad)     Auditory Comprehension   Overall Auditory Comprehension  Appears within functional limits for tasks assessed      Verbal Expression   Overall Verbal Expression  Appears within functional limits for tasks assessed      Oral Motor/Sensory Function   Overall Oral Motor/Sensory Function  Impaired    Labial ROM  Reduced right    Labial Strength  Reduced Left    Labial Coordination  Reduced    Lingual ROM  Reduced left;Reduced right  decr'd lateral movement lt more than rt   Lingual Symmetry  --   deviated to rt   Lingual Strength  Reduced    Lingual Sensation  --   difficult to test due to decr'd lateral mobility   Lingual Coordination  Reduced      Motor Speech   Overall Motor Speech  Impaired    Phonation  Other (comment)   difficult to control voicing on/off   Articulation  Impaired    Level of Impairment  Word    Intelligibility  Intelligibility reduced    Word  25-49% accurate   40% with mod cues   Phrase  0-24% accurate   <20%   Sentence  0-24% accurate   <20%   Motor Planning  Impaired    Level of Impairment  Word    Motor Speech Errors  Aware    Effective Techniques  Over-articulate    Phonation  --           SLP Education - 04/15/20 1349    Education Details  speech will be different than prior  to March 2021, therapy freqency/duration    Person(s) Educated  Patient    Methods  Explanation    Comprehension  Verbalized understanding         SLP Long Term Goals - 04/15/20 1356      SLP LONG TERM GOAL #1   Title  Pt will complete HEP for oral strengthening    Baseline  not provided    Time  4    Period  Weeks    Status  New      SLP LONG TERM GOAL #2   Title  pt will use compensations to improve sentence (at least 8 words) intelligibility to 30%    Baseline  <20%    Time  4    Period  Weeks    Status  New      SLP LONG TERM GOAL #3   Title  pt will utilize low tech speech compenstory device PRN in 10 minutes conversation, 85% successfully, in 2 sessions    Baseline  not utilized yet    Time  4    Period  Weeks    Status  New       Plan - 04/15/20 1232    Clinical Impression Statement  Pt presents with significant oral speech deficits including severe dysarthria (R47.1) and likely verbal apraxia (R48.2). Pt is mildly concerned about his speech and more concerned about his walking/mobility and desires more of his sessions ultimately be put towards PT.He would benefit from a short course of ST targeting home exercises and compensations for his speech intelligibilty.    Speech Therapy Frequency  1x /week    Duration  4 weeks   pt would like to put most of his visits into PT      Patient will benefit from skilled therapeutic intervention in order to improve the following deficits and impairments:   Dysarthria and anarthria  Verbal apraxia    Problem List Patient Active Problem List   Diagnosis Date Noted  . Dysarthria, post-stroke 04/07/2020  . 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  . Hyperglycemia   . Anemia   . Acute pain of right knee   . Primary osteoarthritis of right knee   . Expressive language impairment   . Benign essential HTN   . Acute bilat watershed infarction [facility]) 03/16/2020  . Cerebral infarction, watershed distribution, unilateral, chronic  03/16/2020  . Acute renal  failure (HCC)   . History of CVA in adulthood   . Dyslipidemia   . CVA (cerebral vascular accident) (HCC) 03/11/2020  . Leukocytosis   . Hemiparesis affecting dominant side as late effect of stroke (HCC)   . Labile blood pressure   . Sleep disturbance   . Benign hypertensive heart and kidney disease with diastolic CHF, NYHA class II and CKD stage III (HCC)   . Chronic systolic congestive heart failure (HCC)   . Morbid obesity (HCC)   . Uncontrolled hypertension   . Dysphagia, post-stroke   . Cardiomegaly   . Acute systolic congestive heart failure (HCC)   . 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 Cerebral embolism with cerebral infarction 07/11/2019  . Acute CVA (cerebrovascular accident) (HCC) 07/11/2019  . Slurred speech 07/10/2019  . Cardiomyopathy (HCC) 11/20/2018  . History of stroke 09/06/2018  . Tobacco use 09/06/2018  . History of non-ST elevation myocardial infarction (NSTEMI) 05/05/2018  . Severe Vitamin D deficiency 04/02/2018  . Community acquired pneumonia of left lower lobe of lung 04/02/2018  . CKD (chronic kidney disease), stage III (HCC) = Secondary to Hypertension 04/02/2018  . Pneumonia 03/30/2018  . Metabolic acidosis 03/30/2018  . AKI (acute kidney injury) (HCC) 03/30/2018  . N&V (nausea and vomiting) 03/30/2018  . Essential hypertension 03/30/2018    [facility] ,MS, CCC-SLP  04/15/2020, 2:05 PM  [facility] [address] [place], [place], [address] Phone: [phone]   Fax:  [phone]   Name: Zachary Mccann MRN: [id number] Date of Birth: Oct 17, 1971

## 2020-04-15 NOTE — Patient Instructions (Signed)
Because you said walking was more important to you than talking, you wanted to have 4 follow up ST sessions. That is fine. We will work on using the [employer] and your speech clarity.

## 2020-04-19 ENCOUNTER — Ambulatory Visit: Payer: Medicaid Other | Attending: Nurse Practitioner | Admitting: Occupational Therapy

## 2020-04-19 ENCOUNTER — Ambulatory Visit: Payer: Medicaid Other | Admitting: Adult Health

## 2020-04-19 ENCOUNTER — Other Ambulatory Visit: Payer: Self-pay

## 2020-04-19 DIAGNOSIS — R482 Apraxia: Secondary | ICD-10-CM | POA: Insufficient documentation

## 2020-04-19 DIAGNOSIS — R278 Other lack of coordination: Secondary | ICD-10-CM | POA: Insufficient documentation

## 2020-04-19 DIAGNOSIS — R471 Dysarthria and anarthria: Secondary | ICD-10-CM | POA: Diagnosis present

## 2020-04-19 DIAGNOSIS — R2681 Unsteadiness on feet: Secondary | ICD-10-CM

## 2020-04-19 DIAGNOSIS — M6281 Muscle weakness (generalized): Secondary | ICD-10-CM | POA: Diagnosis present

## 2020-04-19 DIAGNOSIS — R2689 Other abnormalities of gait and mobility: Secondary | ICD-10-CM | POA: Diagnosis present

## 2020-04-19 DIAGNOSIS — I69353 Hemiplegia and hemiparesis following cerebral infarction affecting right non-dominant side: Secondary | ICD-10-CM | POA: Insufficient documentation

## 2020-04-19 NOTE — Therapy (Signed)
[facility] [address] [place], [place], [address] Phone: [phone]   Fax:  [phone]  Occupational Therapy Evaluation  Patient Details  Name: Zachary Mccann MRN: [id number] Date of Birth: 07/01/1971 No data recorded  Encounter Date: 04/19/2020  OT End of Session - 04/19/20 1436    Visit Number  1    Number of Visits  11    Date for OT Re-Evaluation  06/28/20    Authorization Type  MCD - awaiting authorization    OT Start Time  1100    OT Stop Time  1150    OT Time Calculation (min)  50 min    Activity Tolerance  Patient tolerated treatment well    Behavior During Therapy  [facility] for tasks assessed/performed       Past Medical History:  Diagnosis Date  . CAD (coronary artery disease)    a. s/p NSTEMI in 04/2018 with DES to LCx.   [name] Hypertension   . Myocardial infarction (HCC)   . Pneumonia   . Stroke [facility])     Past Surgical History:  Procedure Laterality Date  . CORONARY STENT INTERVENTION N/A 05/05/2018   Procedure: CORONARY STENT INTERVENTION;  Surgeon: Marykay Lex, MD;  Location: [facility] INVASIVE CV LAB;  Service: Cardiovascular;  Laterality: N/A;  . LEFT HEART CATH AND CORONARY ANGIOGRAPHY N/A 05/05/2018   Procedure: LEFT HEART CATH AND CORONARY ANGIOGRAPHY;  Surgeon: Marykay Lex, MD;  Location: [facility] INVASIVE CV LAB;  Service: Cardiovascular;  Laterality: N/A;  . TEE WITHOUT CARDIOVERSION N/A 09/08/2018   Procedure: TRANSESOPHAGEAL ECHOCARDIOGRAM (TEE);  Surgeon: Laurey Morale, MD;  Location: [facility] ENDOSCOPY;  Service: Cardiovascular;  Laterality: N/A;    There were no vitals filed for this visit.  Subjective Assessment - 04/19/20 1112    Pertinent History  New CVA 03/11/20 w/ increased weakness on Rt side. Lt CVA w/ Rt hemiparesis July 2020, HTN, CAD, MI, s/p NSTEMI May 2019.    Limitations  max assist of 2 to stand, fall risk, aphasic    Currently in Pain?  Yes   RT knee - O.T. not addressing        [facility] OT Assessment - 04/19/20 0001      Assessment   Medical Diagnosis  CVA   Rt hemiparesis (from first stroke in 2020, weaker since new stroke)   Onset Date/Surgical Date  03/11/20   previous stroke July 2020   Hand Dominance  Left    Prior Therapy  Pt was in inpatient rehab with PT, OT, ST      Precautions   Precautions  Fall      Restrictions   Weight Bearing Restrictions  No      Balance Screen   Has the patient fallen in the past 6 months  No      Home  Environment   Bathroom Shower/Tub  Tub/Shower unit;Curtain    Home Equipment  Tub bench;Bedside commode;Wheelchair - [employer] - 2 wheels    Additional Comments  Pt lives w/ wife and 6 kids in 2 story home w/ bedroom and bathroom on 1st floor w/ ramped entrance.     Lives With  Family      Prior Function   Level of Independence  Needs assistance with ADLs;Needs assistance with transfers;Needs assistance with gait   since first stroke in July 2020   Vocation  On disability    Leisure  fishing, eating      ADL  Eating/Feeding  Needs assist with cutting food    Grooming  Moderate assistance   Independent brushing teeth, dep for shaving   Upper Body Bathing  Moderate assistance   sponge bathing   Lower Body Bathing  Maximal assistance   sponge bathing   Upper Body Dressing  Moderate assistance    Lower Body Dressing  +1 Total aassistance    Toilet Transfer  Maximal assistance   uses BSC   Toileting - Clothing Manipulation  + 1 Total assistance   DEPENDENT   Toileting -  Hygiene  + 1 Total assistance    Tub/Shower Transfer  --   N/A  - sponge bathing     IADL   Shopping  Completely unable to shop    Light Housekeeping  Does not participate in any housekeeping tasks    Meal Prep  Able to complete simple warm meal prep   only from w/c level - pt can wheel into kitchen, wife cookin   Publishing rights manager on family or friends for transportation      Mobility   Mobility Status Comments  max  assist x 2 to stand, max assist x 1 for walking w/ walker per PT eval      Written Expression   Dominant Hand  Left    Handwriting  75% legible   for name, goes off line     Vision - History   Baseline Vision  No visual deficits    Additional Comments  Pt reported mild visual deficits (? diplopia) but has resolved now      [place] Comments  Pt with expressive aphasia and dysarthria, pt appears consistent with yes/no responses.       Observation/Other Assessments   Observations  Wife not present for evaluation to confirm findings from evaluation      Sensation   Light Touch  Appears Intact      Coordination   9 Hole Peg Test  Right    Right 9 Hole Peg Test  placed 2 pegs in 2 min. w/ significant trunk compensations and possible apraxia    Box and Blocks  Rt = 9 blocks w/ initial tremors which improved w/ repetition and trunk compensation d/t decr sh ROM. Pt also got distracted      Praxis   Praxis  Impaired    Praxis Impairment Details  Motor planning      ROM / Strength   AROM / PROM / Strength  AROM      AROM   Overall AROM Comments  LUE WNL's. RUE: sh flex approx 30*, ER 50%, IR 75%, full elbow flex, 75% extension, 60% supination, full pronation, wrist ext to neutral, 90% full composite flexion, ext WFLS      Hand Function   Right Hand Grip (lbs)  10 lbs    Left Hand Grip (lbs)  65 lbs                        OT Short Term Goals - 04/19/20 1449      OT SHORT TERM GOAL #1   Title  Independent with HEP    Baseline  dependent, not yet issued    Time  5    Period  Weeks    Status  New      OT SHORT TERM GOAL #2   Title  Pt to perform 50* or greater shoulder flexion RUE for low level reaching/assist w/ ADLS  Baseline  30*    Time  5    Period  Weeks    Status  New      OT SHORT TERM GOAL #3   Title  Pt to perform UE dressing I'ly    Baseline  mod assist    Time  5    Period  Weeks    Status  New      OT SHORT TERM GOAL #4    Title  Pt to perform transfer to [facility] with max assist x 1    Baseline  total dependence    Time  5    Period  Weeks    Status  New      OT SHORT TERM GOAL #5   Title  Pt to improve RUE function as evidenced by performing 15 blocks on Box & Blocks test    Baseline  Rt = 9    Time  5    Period  Weeks    Status  New        OT Long Term Goals - 04/19/20 1459      OT LONG TERM GOAL #1   Title  Pt to perform LE bathing and dressing with mod assist (max assist to stand to pull up over hips) with A/E prn    Baseline  max assist to total dependence    Time  10    Period  Weeks    Status  New      OT LONG TERM GOAL #2   Title  Pt to perform level transfers with mod assist    Baseline  max assist x 2, or total dependence    Time  10    Period  Weeks    Status  New      OT LONG TERM GOAL #3   Title  Pt to perform RUE shoulder flexion to 70* or greater for consistent low level reaching    Baseline  30*    Time  10    Period  Weeks    Status  New      OT LONG TERM GOAL #4   Title  Grip strength Rt hand to be 20 lbs or greater to assist with opening containers    Baseline  10 lbs    Time  10    Period  Weeks    Status  New      OT LONG TERM GOAL #5   Title  Pt to make simple snack from w/c level, standing at counter to retrieve items from higher shelves    Baseline  dependent - pt only getting snack from w/c level    Time  10    Period  Weeks    Status  New            Plan - 04/19/20 1438    Clinical Impression Statement  Pt is a 49 y.o. male who presents to [place] for evaluation s/p CVA (watershed infarction) most recently on 03/11/20, however Rt non dominant hemiparesis residual from Lt CVA last July 2020. Recent CVA made Rt sided weakness worse and pt more difficult to transfer (wife not present to confirm and pt aphasic however yes/no appeared consistent). Also consulted w/ P.T. prior to OT evaluation. Pt has not had any outpatient therapy for first stroke. Pt also with  medical history including: HTN, CAD, MI, s/p NSTEMI May 2019. Pt presents today with deficits in RUE ROM, strength, coordination, as well as max assist to  total dependence for ADLS and transfers.    Occupational performance deficits (Please refer to evaluation for details):  ADL's;IADL's;Social Participation    Body Structure / Function / Physical Skills  ADL;ROM;IADL;Body mechanics;Endurance;Sensation;Mobility;Flexibility;Strength;Coordination;FMC;Obesity;UE functional use;Decreased knowledge of precautions;Decreased knowledge of use of DME    Rehab Potential  Fair   time since onset, visit limits, severity of deficits   Clinical Decision Making  Several treatment options, min-mod task modification necessary    Comorbidities Affecting Occupational Performance:  Presence of comorbidities impacting occupational performance    Comorbidities impacting occupational performance description:  residual Rt hemiparesis from 1st stroke made worse by second stroke    Modification or Assistance to Complete Evaluation   Min-Moderate modification of tasks or assist with assess necessary to complete eval    OT Frequency  1x / week   (d/t MCD visit limit)   OT Duration  --   10 weeks (plus eval) - due to MCD visit limit   OT Treatment/Interventions  Self-care/ADL training;Therapeutic exercise;Functional Mobility Training;Neuromuscular education;Splinting;Manual Therapy;Therapeutic activities;Coping strategies training;DME and/or AE instruction;Cognitive remediation/compensation;Visual/perceptual remediation/compensation;Passive range of motion;Patient/family education    Plan  HEP for RUE including shoulder, simple coordination, and putty    Consulted and Agree with Plan of Care  Patient       Patient will benefit from skilled therapeutic intervention in order to improve the following deficits and impairments:   Body Structure / Function / Physical Skills: ADL, ROM, IADL, Body mechanics, Endurance, Sensation,  Mobility, Flexibility, Strength, Coordination, FMC, Obesity, UE functional use, Decreased knowledge of precautions, Decreased knowledge of use of DME       Visit Diagnosis: Hemiplegia and hemiparesis following cerebral infarction affecting right non-dominant side (HCC)  Muscle weakness (generalized)  Other lack of coordination  Unsteadiness on feet    Problem List Patient Active Problem List   Diagnosis Date Noted  . Dysarthria, post-stroke 04/07/2020  . 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  . Hyperglycemia   . Anemia   . Acute pain of right knee   . Primary osteoarthritis of right knee   . Expressive language impairment   . Benign essential HTN   . Acute bilat watershed infarction [facility]) 03/16/2020  . Cerebral infarction, watershed distribution, unilateral, chronic 03/16/2020  . Acute renal failure (HCC)   . History of CVA in adulthood   . Dyslipidemia   . CVA (cerebral vascular accident) (HCC) 03/11/2020  . Leukocytosis   . Hemiparesis affecting dominant side as late effect of stroke (HCC)   . Labile blood pressure   . Sleep disturbance   . Benign hypertensive heart and kidney disease with diastolic CHF, NYHA class II and CKD stage III (HCC)   . Chronic systolic congestive heart failure (HCC)   . Morbid obesity (HCC)   . Uncontrolled hypertension   . Dysphagia, post-stroke   . Cardiomegaly   . Acute systolic congestive heart failure (HCC)   . 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 Cerebral embolism with cerebral infarction 07/11/2019  . Acute CVA (cerebrovascular accident) (HCC) 07/11/2019  . Slurred speech 07/10/2019  . Cardiomyopathy (HCC) 11/20/2018  . History of stroke 09/06/2018  . Tobacco use 09/06/2018  . History of non-ST elevation myocardial infarction (NSTEMI) 05/05/2018  . Severe Vitamin D deficiency 04/02/2018  . Community acquired pneumonia of left lower lobe of lung 04/02/2018  . CKD (chronic kidney disease), stage III (HCC) =  Secondary to Hypertension 04/02/2018  . Pneumonia 03/30/2018  . Metabolic acidosis 03/30/2018  . AKI (acute kidney injury) (  HCC) 03/30/2018  . N&V (nausea and vomiting) 03/30/2018  . Essential hypertension 03/30/2018    Kelli Churn, OTR/L 04/19/2020, 3:03 PM  [facility] [address] [place], [place], [address] Phone: [phone]   Fax:  [phone]  Name: Zachary Mccann MRN: [id number] Date of Birth: 01/22/1971

## 2020-04-19 NOTE — Progress Notes (Deleted)
[facility] [address] [place]. [place] [address] [phone]       HOSPITAL FOLLOW UP NOTE  [name] Date of Birth:  1971/10/16 Medical Record Number:  [id number]   Reason for Referral:  hospital stroke follow up    CHIEF COMPLAINT:  No chief complaint on file.   HPI:  Today, 04/19/2020, [name] scheduled for 13-month stroke follow-up.  Since prior visit 11/26/2019: He presented to [facility] ED on 03/11/2020 with increasing right-sided weakness.  Transferred to [facility] for additional stroke work-up.  He was evaluated by neurology but no evaluation by stroke team including Dr. Pearlean Brownie, Dr. Roda Shutters or Dr. Lucia Gaskins.  MRI brain unremarkable for acute process but per MRI report, positive for progressive deep watershed ischemia and development of left-sided Wallerian degeneration since 06/2019.  Evaluated by neurology who recommended continuation of Plavix for secondary stroke prevention and no further work-up recommended.  Evaluated by therapies who recommended CIR with worsening right-sided weakness and dysarthria and discharged to CIR on 03/16/2020.  He made great progress during CIR admission and eventually discharged home on 04/01/2020 with recommendation of outpatient therapies.  Since discharge, he continues to experience severe dysarthria and likely verbal apraxia as well as right-sided weakness.  Continues to work with outpatient PT/OT/ST at neuro rehab.  He denies any new or worsening stroke/TIA symptoms since discharge.  Continues on clopidogrel and atorvastatin for secondary stroke prevention.  Blood pressure today ***.  No further concerns at this time.    History provided for reference purposes only Stroke admission 07/10/2019: [name] is a 49 y.o. male with history of ongoing tobacco use, CAD w stent / Mi, hypertension, previous left cerebellar stroke with residual left-sided deficits 08/2018 and non compliant with medications after losing his  insurance coverage, who was found to be much more dysarthric with right facial droop  and right-sided weakness.  Stroke work-up revealed left MCA and ACA scattered infarcts as evidenced on MRI due to left ICA occlusion (large vessel disease).  He did not receive IV t-PA due to late presentation (>4.5 hours from time of onset).  MRI showed extensive left hemisphere acute/subacute white matter infarcts involving the left centrum semiovale and corona radiata as well as border zone areas.  MRA showed left ICA occlusion, bilateral VA distal occlusion, BA proximal occlusion with distal retrograde recon from PCOMs.  Carotid Doppler showed left ICA/CCA occlusion.  2D echo showed an EF of 40%.  LDL 90.  A1c 5.6.  UDS positive for THC.  Recommended initiation of Plavix as he has a history of aspirin allergy.  Prior stroke 08/2018 left superior cerebellar infarct as well as evidence of prior strokes.  Hypercoagulable work-up at that time negative.  TEE unremarkable.  30 cardiac event monitor outpatient negative for A. fib.  History of CAD with cardiomyopathy non-STEMI post stent 04/2018 and medication noncompliance due to financial difficulty.  Current tobacco use of smoking cessation counseling provided.  HTN stable and recommended long-term BP goal 130-150 due to left ICA occlusion.  Initiated atorvastatin 40 mg daily.  Other stroke risk factors include intracranial stenosis, previous EtOH use and prior drug use, THC use, obesity, family history of stroke and medication noncompliance.  Residual deficits of dysphagia, expressive aphasia and right hemiparesis and discharged to CIR for ongoing therapies.  He was discharged home with recommendation of ongoing therapies on 08/12/2019 with residual left hemiparesis, right hemiparesis, dysphagia and aphasia.  Initial visit 11/26/2019: [name] is a  49 year old male who is being seen today for hospital follow-up.  Residual deficits from most recent stroke include right  hemiparesis, and severe expressive aphasia without improvement.  He denies residual dysphagia concerns.  He also continues to have residual left hemiparesis from prior stroke.  He is primarily nonverbal but is able to yes and no appropriately.  Difficulty obtaining large amount of information due to communication barrier but able to ask yes/no questions with appropriate responses.  After inpatient rehab discharge, he was recommended for patient to receive home health therapies but he denies receiving any type of therapies at this time.  He is currently living in his own home with his girlfriend who helps assist with daily needs.  He is nonambulatory and transfers via wheelchair.  He has continued on Plavix without bleeding or bruising.  Has continued on atorvastatin 40 mg daily without myalgias.  Blood pressure today 138/74.  He continues to follow with cardiology regularly.  No further concerns at this time.     ROS:   14 system review of systems performed and negative with exception of speech difficulty, gait impairment, weakness  PMH:  Past Medical History:  Diagnosis Date  . CAD (coronary artery disease)    a. s/p NSTEMI in 04/2018 with DES to LCx.   [name] Hypertension   . Myocardial infarction ([place])   . Pneumonia   . Stroke [facility])     PSH:  Past Surgical History:  Procedure Laterality Date  . CORONARY STENT INTERVENTION N/A 05/05/2018   Procedure: CORONARY STENT INTERVENTION;  Surgeon: Leonie Man, MD;  Location: [place] [facility];  Service: Cardiovascular;  Laterality: N/A;  . LEFT HEART CATH AND CORONARY ANGIOGRAPHY N/A 05/05/2018   Procedure: LEFT HEART CATH AND CORONARY ANGIOGRAPHY;  Surgeon: Leonie Man, MD;  Location: [place] [facility];  Service: Cardiovascular;  Laterality: N/A;  . TEE WITHOUT CARDIOVERSION N/A 09/08/2018   Procedure: TRANSESOPHAGEAL ECHOCARDIOGRAM (TEE);  Surgeon: Larey Dresser, MD;  Location: [facility] ENDOSCOPY;  Service: Cardiovascular;  Laterality: N/A;     Social History:  Social History   Socioeconomic History  . Marital status: Single    Spouse name: Not on file  . Number of children: Not on file  . Years of education: Not on file  . Highest education level: Not on file  Occupational History  . Not on file  Tobacco Use  . Smoking status: Former Smoker    Types: Cigarettes  . Smokeless tobacco: Never Used  . Tobacco comment: smoked for 10-20 years at 1/2 ppd as of 2019  Substance and Sexual Activity  . Alcohol use: Not Currently  . Drug use: Not Currently  . Sexual activity: Yes  Other Topics Concern  . 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 Has girlfriend. Lives with girlfriend.   Social Determinants of Health   Financial Resource Strain:   . Difficulty of Paying Living Expenses:   Food Insecurity:   . Worried About Charity fundraiser in the Last Year:   . Arboriculturist in the Last Year:   Transportation Needs:   . Film/video editor (Medical):   [name] Lack of Transportation (Non-Medical):   Physical Activity:   . Days of Exercise per Week:   . Minutes of Exercise per Session:   Stress:   . Feeling of Stress :   Social Connections:   . Frequency of Communication with  Friends and Family:   . Frequency of Social Gatherings with Friends and Family:   . Attends Religious Services:   . Active Member of Clubs or Organizations:   . Attends Banker Meetings:   [name] Marital Status:   Intimate Partner Violence:   . Fear of Current or Ex-Partner:   . Emotionally Abused:   [name] Physically Abused:   . Sexually Abused:     Family History:  Family History  Problem Relation Age of Onset  . Hypertension Mother   . Stroke Mother   . Dementia Mother   . Hypertension Father   . Stroke Father   . Cancer Father   . Alcoholism Father   . Cancer Sister     Medications:   Current Outpatient Medications on File Prior to Visit   Medication Sig Dispense Refill  . acetaminophen (TYLENOL) 325 MG tablet Take 2 tablets (650 mg total) by mouth every 6 (six) hours as needed for mild pain (or Fever >/= 101).    [name] amLODipine (NORVASC) 10 MG tablet Take 1 tablet (10 mg total) by mouth daily. 90 tablet 0  . atorvastatin (LIPITOR) 40 MG tablet TAKE 1 TABLET BY MOUTH ONCE DAILY AT  6:00  PM 90 tablet 0  . carvedilol (COREG) 6.25 MG tablet Take 1 tablet (6.25 mg total) by mouth 2 (two) times daily with a meal. 180 tablet 0  . clopidogrel (PLAVIX) 75 MG tablet Take 1 tablet (75 mg total) by mouth daily. 90 tablet 3  . diclofenac sodium (VOLTAREN) 1 % GEL Apply 2 g topically 4 (four) times daily. 2 g 0  . hydrALAZINE (APRESOLINE) 25 MG tablet Take 1 tablet (25 mg total) by mouth 3 (three) times daily. 90 tablet 3  . Melatonin 10 MG TABS Take 10 mg by mouth at bedtime. 30 tablet 0  . mirtazapine (REMERON) 7.5 MG tablet Take 1 tablet (7.5 mg total) by mouth at bedtime. (Patient not taking: Reported on 04/08/2020) 30 tablet 0  . Multiple Vitamin (MULTIVITAMIN WITH MINERALS) TABS tablet Take 1 tablet by mouth daily.  0  . senna-docusate (SENOKOT-S) 8.6-50 MG tablet Take 2 tablets by mouth at bedtime.    . traMADol (ULTRAM) 50 MG tablet Take 1 tablet (50 mg total) by mouth every 8 (eight) hours as needed for severe pain. 20 tablet 0  . [DISCONTINUED] amLODipine (NORVASC) 10 MG tablet Take 1 tablet (10 mg total) by mouth daily. 30 tablet 0  . [DISCONTINUED] atorvastatin (LIPITOR) 40 MG tablet Take 1 tablet (40 mg total) by mouth daily at 6 PM. 30 tablet 0  . [DISCONTINUED] carvedilol (COREG) 6.25 MG tablet Take 1 tablet (6.25 mg total) by mouth 2 (two) times daily with a meal. 60 tablet 0   No current facility-administered medications on file prior to visit.    Allergies:   Allergies  Allergen Reactions  . Aspirin Swelling     Physical Exam  There were no vitals filed for this visit. There is no height or weight on file to  calculate BMI. No exam data present  General: Pleasant morbidly obese middle-aged African-American male, seated, in no evident distress Head: head normocephalic and atraumatic.   Neck: supple with no carotid or supraclavicular bruits Cardiovascular: regular rate and rhythm, no murmurs Musculoskeletal: no deformity Skin:  no rash/petichiae Vascular:  Normal pulses all extremities   Neurologic Exam Mental Status: Awake and fully alert.   Severe expressive aphasia and severe dysarthria with attempts at speech but and  intangible words.  Not able to name or repeat.  Oriented to place and time with choices and nodding yes/no appropriately to questions.  Unable to fully assess cognition due to communication deficit but appears intact.  Mood and affect appropriate.  Cranial Nerves: Fundoscopic exam reveals sharp disc margins. Pupils equal, briskly reactive to light. Extraocular movements full without nystagmus. Visual fields full to confrontation. Hearing intact. Facial sensation intact.  Right lower facial droop. Motor: Normal bulk and tone.  RUE: 3/5 with weak grip strength and increased tone RLE: 3 -/5 hip flexor, knee extension and flexion and 4/5 dorsiflexion LUE and LLE: 4/5 Sensory.: intact to touch , pinprick , position and vibratory sensation.  Coordination: Rapid alternating movements diminished R>L. Finger-to-nose and heel-to-shin unable to perform adequately due to weakness Gait and Station: Deferred as patient nonambulatory Reflexes: 1+ and symmetric. Toes downgoing.        ASSESSMENT: Zachary Mccann is a 49 y.o. year old male with history of left MCA and ACA scattered infarcts secondary to large vessel disease with left ICA occlusion on 07/10/2019 with residual right hemiparesis and severe aphasia/dysarthria.  On 03/11/2020, he experienced worsening right-sided hemiparesis and dysarthria with MRI negative for acute infarct but did show positive progressive deep watershed ischemia  and development of left-sided Wallerian degeneration since 06/2019.  Vascular risk factors include tobacco use, CAD with cardiomyopathy and MI post stent, prior strokes, HTN, HLD and medication noncompliance due to loss of insurance.      PLAN:  1. Left MCA/ACA infarcts: Continue clopidogrel 75 mg daily  and atorvastatin for secondary stroke prevention. Maintain strict control of hypertension with blood pressure goal below 130/90, diabetes with hemoglobin A1c goal below 6.5% and cholesterol with LDL cholesterol (bad cholesterol) goal below 70 mg/dL.  I also advised the patient to eat a healthy diet with plenty of whole grains, cereals, fruits and vegetables, exercise regularly with at least 30 minutes of continuous activity daily and maintain ideal body weight. 2. Post stroke deficits: Referral placed for home health nursing PT/OT/ST for possible improvement of residual deficits 3. HTN: Advised to continue current treatment regimen.  Today's BP stable.  Advised to continue to monitor at home along with continued follow-up with PCP for management 4. HLD: Advised to continue current treatment regimen along with continued follow-up with PCP for future prescribing and monitoring of lipid panel 5. Extra and intracranial stenosis/occlusion: Continue on Plavix.  Unable to do DAPT due to aspirin allergy.  Aggressive stroke risk factor modification.  Tobacco cessation.  Medication compliance 6. Tobacco use: Endorses cessation and highly encouraged continuation 7. CAD: Continue to follow with cardiology for ongoing monitoring and management    Follow up in 3 months or call earlier if needed   Greater than 50% of time during this 45 minute visit was spent on counseling, explanation of diagnosis of left MCA/ACA infarcts, discussion regarding residual deficits and importance of initiating therapy, reviewing risk factor management of HTN, HLD, extracranial stenosis/occlusion, CAD and prior tobacco use,  discussion regarding importance of medication compliance, planning of further management along with potential future management, and discussion with patient with review of further plan    Ihor Austin, [facility]  [facility] [address] [place], [place] [address]  Phone [phone] Fax [phone] Note: This document was prepared with digital dictation and possible smart phrase technology. Any transcriptional errors that result from this process are unintentional.

## 2020-04-21 ENCOUNTER — Other Ambulatory Visit: Payer: Self-pay

## 2020-04-21 ENCOUNTER — Ambulatory Visit: Payer: Medicaid Other

## 2020-04-21 DIAGNOSIS — M6281 Muscle weakness (generalized): Secondary | ICD-10-CM

## 2020-04-21 DIAGNOSIS — I69353 Hemiplegia and hemiparesis following cerebral infarction affecting right non-dominant side: Secondary | ICD-10-CM | POA: Diagnosis not present

## 2020-04-21 DIAGNOSIS — R2689 Other abnormalities of gait and mobility: Secondary | ICD-10-CM

## 2020-04-21 NOTE — Therapy (Addendum)
[facility] [address] [place], [place], [address] Phone: [phone]   Fax:  [phone]  Physical Therapy Treatment  Patient Details  Name: Zachary Mccann MRN: [id number] Date of Birth: May 27, 1971 Referring Provider (PT): Referred by Lauraine Rinne but neurologist is Delice Lesch   Encounter Date: 04/21/2020  PT End of Session - 04/21/20 0939    Visit Number  3    Number of Visits  11    Date for PT Re-Evaluation  06/07/20    Authorization Type  Medicaid 4/28-5/18 3 visits    Authorization - Visit Number  2    Authorization - Number of Visits  3    PT Start Time  0935    PT Stop Time  1017    PT Time Calculation (min)  42 min    Equipment Utilized During Treatment  Gait belt   bariatric RW   Activity Tolerance  Patient tolerated treatment well    Behavior During Therapy  [facility] for tasks assessed/performed       Past Medical History:  Diagnosis Date  . CAD (coronary artery disease)    a. s/p NSTEMI in 04/2018 with DES to LCx.   [name] Hypertension   . Myocardial infarction ([place])   . Pneumonia   . Stroke [facility])     Past Surgical History:  Procedure Laterality Date  . CORONARY STENT INTERVENTION N/A 05/05/2018   Procedure: CORONARY STENT INTERVENTION;  Surgeon: Leonie Man, MD;  Location: [place] CV LAB;  Service: Cardiovascular;  Laterality: N/A;  . LEFT HEART CATH AND CORONARY ANGIOGRAPHY N/A 05/05/2018   Procedure: LEFT HEART CATH AND CORONARY ANGIOGRAPHY;  Surgeon: Leonie Man, MD;  Location: [place] CV LAB;  Service: Cardiovascular;  Laterality: N/A;  . TEE WITHOUT CARDIOVERSION N/A 09/08/2018   Procedure: TRANSESOPHAGEAL ECHOCARDIOGRAM (TEE);  Surgeon: Larey Dresser, MD;  Location: [facility] ENDOSCOPY;  Service: Cardiovascular;  Laterality: N/A;    There were no vitals filed for this visit.  Subjective Assessment - 04/21/20 0938    Subjective  Pt reports that right knee and ankle are a little  more sore.    Patient is accompained by:  --   girlfriend   Pertinent History  history of hypertension, tobacco abuse, CAD with non-STEMI 04/2018 as well as left ICA occlusion July 2020 with associated CVA causing right side weakness and slurred speech. New watershed 3/21    Currently in Pain?  Yes    Pain Score  6     Pain Location  Knee    Pain Orientation  Right    Pain Descriptors / Indicators  --   just hurts   Pain Type  Acute pain    Pain Onset  In the past 7 days    Pain Frequency  Intermittent                       [facility] Adult PT Treatment/Exercise - 04/21/20 0954      Transfers   Transfers  Sit to Stand;Stand to Sit;Stand Pivot Transfers    Sit to Stand  3: Mod assist;1: +2 Total assist;1: +1 Total assist    Sit to Stand Details  Verbal cues for technique    Sit to Stand Details (indicate cue type and reason)  Pt was mod assist +2 to rise from wheelchair, mod assist +1 from elevated mat at RW.    Stand to Sit  4: Min assist  Stand to Sit Details (indicate cue type and reason)  Verbal cues for technique    Comments  Pt performed sit to stand x 5 from edge of mat with verbal cues to lean forward and try to get more weight on right leg.      Ambulation/Gait   Ambulation/Gait  Yes    Ambulation/Gait Assistance  4: Min assist    Ambulation/Gait Assistance Details  Pt was cued to increase right foot clearance and try to shift weight over right a little more with larger left step.    Ambulation Distance (Feet)  50 Feet    Assistive device  Rolling walker   bariatric, w/c follow   Gait Pattern  Step-through pattern;Decreased hip/knee flexion - right;Decreased weight shift to right;Decreased stance time - left    Ambulation Surface  Level;Indoor      Neuro Re-ed    Neuro Re-ed Details   Standing at [place] with mirror in front weight shifting 5 x 2 with PT helping to facilitate, standing at walker with stepping left foot forward and back x 4. Standing at walker in  staggered stance with left foot forward 30 sec x 2. Pt reporting feeling "rough" after and needing to rest. Denied any increased pain. CGA/min assist with activities to prevent too much anterior lean.      Exercises   Exercises  Knee/Hip      Knee/Hip Exercises: Aerobic   Other Aerobic  SciFit x 5 min level 3.5 with PT stabilizing at right foot to keep on pedal             PT Education - 04/21/20 1203    Education Details  Pt to continue with current HEP    Person(s) Educated  Patient;Spouse    Methods  Explanation    Comprehension  Verbalized understanding       PT Short Term Goals - 04/08/20 1419      PT SHORT TERM GOAL #1   Title  Pt will be independent with initial HEP for strengthening, flexibility to continue with wife's assistance.    Baseline  pt has been started on initial HEP and is performing with wife assistance.   Time  4    Period  Weeks    Status  Met   Target Date  05/08/20      PT SHORT TERM GOAL #2   Title  Pt will be CGA with bed mobility for improved function in home.    Baseline  mod assist at eval. Not reassessed yet as missed 3rd visit.   Time  4    Period  Weeks    Status  New    Target Date  05/08/20      PT SHORT TERM GOAL #3   Title  Pt will be able to perform sit to stand from wheelchair mod assist for improved function/functional strength at home.    Baseline  as of 5/6 visit mod assist +2 from w/c   Time  4    Period  Weeks    Status  New    Target Date  05/08/20      PT SHORT TERM GOAL #4   Title  Pt will ambulate 47' with RW min assist for improved household mobility.    Baseline  as of 5/6 visit 50' min assist with RW   Time  4    Period  Weeks    Status  Met   Target Date  05/08/20  PT Long Term Goals - 04/08/20 1423      PT LONG TERM GOAL #1   Title  Pt will be to perform progressive HEP with wife assist to continue gains on own.    Baseline  no current HEP    Time  10    Period  Weeks    Status  New     Target Date  06/17/20      PT LONG TERM GOAL #2   Title  Pt will be able to perform transfers CGA for improved functional strength and mobility.    Baseline  mod assist +2    Time  10    Period  Weeks    Status  New    Target Date  06/17/20      PT LONG TERM GOAL #3   Title  Pt will be able to ambulate >100' CGA with RW for further improvement in household mobility.    Baseline  50' min assist with RW   Time  10    Period  Weeks    Status  New    Target Date  06/17/20      PT LONG TERM GOAL #4   Title  Pt will be able to maintain standing x 5 min with supervision with light UE support on walker or counter for improved balance and participation with ADLs.    Baseline  CGA to stand 30 sec   Time  10    Period  Weeks    Status  New    Target Date  06/17/20            Plan - 04/21/20 1204    Clinical Impression Statement  Pt was able to perform transfer from mat with less assistance today. Increased gait distance. Does fatigue with activities and needs frequent rest breaks. Addendum: Pt missed 3rd visit. Pt has been showing continued progress with therapy requiring less assistance with transfers. He is now able to walk 50' min assist with RW. Pt will continue to benefit from skilled PT to continue to progress towards goals.   Personal Factors and Comorbidities  Comorbidity 3+    Comorbidities  hypertension, tobacco abuse, CAD with non-STEMI 04/2018 as well as left ICA occlusion July 2020    Examination-Activity Limitations  Bathing;Hygiene/Grooming;Stairs;Bed Mobility;Locomotion Level;Stand;Toileting;Transfers    Examination-Participation Restrictions  Community Activity    Stability/Clinical Decision Making  Evolving/Moderate complexity    Rehab Potential  Good    PT Frequency  1x / week    PT Duration  --   10 weeks plus eval   PT Treatment/Interventions  ADLs/Self Care Home Management;Moist Heat;Electrical Stimulation;Gait training;DME Instruction;Functional mobility  training;Stair training;Neuromuscular re-education;Balance training;Therapeutic exercise;Therapeutic activities;Patient/family education;Orthotic Fit/Training;Vestibular;Passive range of motion;Manual techniques;Wheelchair mobility training    PT Next Visit Plan  Initial check on STGs as 3rd medicaid visit. Request 1x/week x 7 week extension. Continue with transfers, bed mobility, standing and gait.    Consulted and Agree with Plan of Care  Patient       Patient will benefit from skilled therapeutic intervention in order to improve the following deficits and impairments:  Abnormal gait, Decreased activity tolerance, Decreased balance, Decreased endurance, Decreased mobility, Decreased knowledge of use of DME, Decreased range of motion, Decreased strength, Impaired UE functional use, Pain  Visit Diagnosis: Other abnormalities of gait and mobility  Muscle weakness (generalized)     Problem List Patient Active Problem List   Diagnosis Date Noted  . Dysarthria, post-stroke 04/07/2020  .  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  . Hyperglycemia   . Anemia   . Acute pain of right knee   . Primary osteoarthritis of right knee   . Expressive language impairment   . Benign essential HTN   . Acute bilat watershed infarction [facility]) 03/16/2020  . Cerebral infarction, watershed distribution, unilateral, chronic 03/16/2020  . Acute renal failure ([place])   . History of CVA in adulthood   . Dyslipidemia   . CVA (cerebral vascular accident) ([place]) 03/11/2020  . Leukocytosis   . Hemiparesis affecting dominant side as late effect of stroke ([place])   . Labile blood pressure   . Sleep disturbance   . Benign hypertensive heart and kidney disease with diastolic CHF, NYHA class II and CKD stage III ([place])   . Chronic systolic congestive heart failure ([place])   . Morbid obesity ([place])   . Uncontrolled hypertension   . Dysphagia, post-stroke   . Cardiomegaly   . Acute systolic congestive heart failure ([place])   .  Left middle cerebral artery stroke ([place]) 07/17/2019  . Cerebral embolism with cerebral infarction 07/11/2019  . Acute CVA (cerebrovascular accident) ([place]) 07/11/2019  . Slurred speech 07/10/2019  . Cardiomyopathy ([place]) 11/20/2018  . History of stroke 09/06/2018  . Tobacco use 09/06/2018  . History of non-ST elevation myocardial infarction (NSTEMI) 05/05/2018  . Severe Vitamin D deficiency 04/02/2018  . Community acquired pneumonia of left lower lobe of lung 04/02/2018  . CKD (chronic kidney disease), stage III ([place]) = Secondary to Hypertension 04/02/2018  . Pneumonia 03/30/2018  . Metabolic acidosis 63/89/3734  . AKI (acute kidney injury) ([place]) 03/30/2018  . N&V (nausea and vomiting) 03/30/2018  . Essential hypertension 03/30/2018    Electa Sniff, PT, DPT, NCS 04/21/2020, 12:06 PM  [facility] [address] [place], [place], [address] Phone: [phone]   Fax:  [phone]  Name: Zachary Mccann MRN: [id number] Date of Birth: 29-May-1971

## 2020-04-27 ENCOUNTER — Ambulatory Visit: Payer: Medicaid Other

## 2020-04-28 ENCOUNTER — Ambulatory Visit: Payer: Medicaid Other | Admitting: Speech Pathology

## 2020-04-28 ENCOUNTER — Ambulatory Visit: Payer: Medicaid Other | Admitting: Occupational Therapy

## 2020-04-28 ENCOUNTER — Other Ambulatory Visit: Payer: Self-pay

## 2020-04-28 DIAGNOSIS — I69353 Hemiplegia and hemiparesis following cerebral infarction affecting right non-dominant side: Secondary | ICD-10-CM

## 2020-04-28 DIAGNOSIS — R278 Other lack of coordination: Secondary | ICD-10-CM

## 2020-04-28 DIAGNOSIS — M6281 Muscle weakness (generalized): Secondary | ICD-10-CM

## 2020-04-28 DIAGNOSIS — R471 Dysarthria and anarthria: Secondary | ICD-10-CM

## 2020-04-28 DIAGNOSIS — R482 Apraxia: Secondary | ICD-10-CM

## 2020-04-28 NOTE — Therapy (Signed)
[facility] [address] [place], [place], [address] Phone: [phone]   Fax:  [phone]  Occupational Therapy Treatment  Patient Details  Name: Zachary Mccann MRN: [id number] Date of Birth: Nov 18, 1971 No data recorded  Encounter Date: 04/28/2020  OT End of Session - 04/28/20 1525    Visit Number  2    Number of Visits  11    Date for OT Re-Evaluation  06/28/20    Authorization Type  MCD    Authorization Time Period  Approved 10 visits from 5/13 - 07/06/20    Authorization - Visit Number  1    Authorization - Number of Visits  10    OT Start Time  1017    OT Stop Time  1102    OT Time Calculation (min)  45 min    Activity Tolerance  Patient tolerated treatment well    Behavior During Therapy  [facility] for tasks assessed/performed       Past Medical History:  Diagnosis Date  . CAD (coronary artery disease)    a. s/p NSTEMI in 04/2018 with DES to LCx.   [name] Hypertension   . Myocardial infarction (HCC)   . Pneumonia   . Stroke [facility])     Past Surgical History:  Procedure Laterality Date  . CORONARY STENT INTERVENTION N/A 05/05/2018   Procedure: CORONARY STENT INTERVENTION;  Surgeon: Marykay Lex, MD;  Location: [facility] INVASIVE CV LAB;  Service: Cardiovascular;  Laterality: N/A;  . LEFT HEART CATH AND CORONARY ANGIOGRAPHY N/A 05/05/2018   Procedure: LEFT HEART CATH AND CORONARY ANGIOGRAPHY;  Surgeon: Marykay Lex, MD;  Location: [facility] INVASIVE CV LAB;  Service: Cardiovascular;  Laterality: N/A;  . TEE WITHOUT CARDIOVERSION N/A 09/08/2018   Procedure: TRANSESOPHAGEAL ECHOCARDIOGRAM (TEE);  Surgeon: Laurey Morale, MD;  Location: [facility] ENDOSCOPY;  Service: Cardiovascular;  Laterality: N/A;    There were no vitals filed for this visit.  Subjective Assessment - 04/28/20 1021    Pertinent History  New CVA 03/11/20 w/ increased weakness on Rt side. Lt CVA w/ Rt hemiparesis July 2020, HTN, CAD, MI, s/p NSTEMI May 2019.    Limitations  max assist of 2 to stand, fall risk, aphasic    Currently in Pain?  No/denies      Pt issued HEP for RUE including proper stretching and AA/ROM for shoulder flexion, functional activities, putty HEP for grip and pinch strength, and coordination activities - see pt instructions for details. Pt returned demo of all with min to mod cueing.                     OT Education - 04/28/20 1527    Education Details  HEP for RUE    Person(s) Educated  Patient    Methods  Explanation;Demonstration;Handout;Verbal cues    Comprehension  Verbalized understanding;Returned demonstration       OT Short Term Goals - 04/28/20 1526      OT SHORT TERM GOAL #1   Title  Independent with HEP    Baseline  dependent, not yet issued    Time  5    Period  Weeks    Status  On-going      OT SHORT TERM GOAL #2   Title  Pt to perform 50* or greater shoulder flexion RUE for low level reaching/assist w/ ADLS    Baseline  30*    Time  5    Period  Weeks    Status  New      OT SHORT TERM GOAL #3   Title  Pt to perform UE dressing I'ly    Baseline  mod assist    Time  5    Period  Weeks    Status  New      OT SHORT TERM GOAL #4   Title  Pt to perform transfer to [facility] with max assist x 1    Baseline  total dependence    Time  5    Period  Weeks    Status  New      OT SHORT TERM GOAL #5   Title  Pt to improve RUE function as evidenced by performing 15 blocks on Box & Blocks test    Baseline  Rt = 9    Time  5    Period  Weeks    Status  New        OT Long Term Goals - 04/19/20 1459      OT LONG TERM GOAL #1   Title  Pt to perform LE bathing and dressing with mod assist (max assist to stand to pull up over hips) with A/E prn    Baseline  max assist to total dependence    Time  10    Period  Weeks    Status  New      OT LONG TERM GOAL #2   Title  Pt to perform level transfers with mod assist    Baseline  max assist x 2, or total dependence    Time  10    Period   Weeks    Status  New      OT LONG TERM GOAL #3   Title  Pt to perform RUE shoulder flexion to 70* or greater for consistent low level reaching    Baseline  30*    Time  10    Period  Weeks    Status  New      OT LONG TERM GOAL #4   Title  Grip strength Rt hand to be 20 lbs or greater to assist with opening containers    Baseline  10 lbs    Time  10    Period  Weeks    Status  New      OT LONG TERM GOAL #5   Title  Pt to make simple snack from w/c level, standing at counter to retrieve items from higher shelves    Baseline  dependent - pt only getting snack from w/c level    Time  10    Period  Weeks    Status  New            Plan - 04/28/20 1526    Clinical Impression Statement  Pt progressing towards STG #1.    Occupational performance deficits (Please refer to evaluation for details):  ADL's;IADL's;Social Participation    Body Structure / Function / Physical Skills  ADL;ROM;IADL;Body mechanics;Endurance;Sensation;Mobility;Flexibility;Strength;Coordination;FMC;Obesity;UE functional use;Decreased knowledge of precautions;Decreased knowledge of use of DME    Rehab Potential  Fair   time since onset, visit limit, severity of deficits   OT Frequency  1x / week    OT Duration  --   10 weeks   OT Treatment/Interventions  Self-care/ADL training;Therapeutic exercise;Functional Mobility Training;Neuromuscular education;Splinting;Manual Therapy;Therapeutic activities;Coping strategies training;DME and/or AE instruction;Cognitive remediation/compensation;Visual/perceptual remediation/compensation;Passive range of motion;Patient/family education    Plan  review HEP prn, work on shoulder ROM, progress towards STG's       Patient  will benefit from skilled therapeutic intervention in order to improve the following deficits and impairments:   Body Structure / Function / Physical Skills: ADL, ROM, IADL, Body mechanics, Endurance, Sensation, Mobility, Flexibility, Strength, Coordination,  FMC, Obesity, UE functional use, Decreased knowledge of precautions, Decreased knowledge of use of DME       Visit Diagnosis: Hemiplegia and hemiparesis following cerebral infarction affecting right non-dominant side (HCC)  Other lack of coordination  Muscle weakness (generalized)    Problem List Patient Active Problem List   Diagnosis Date Noted  . Dysarthria, post-stroke 04/07/2020  . 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  . Hyperglycemia   . Anemia   . Acute pain of right knee   . Primary osteoarthritis of right knee   . Expressive language impairment   . Benign essential HTN   . Acute bilat watershed infarction [facility]) 03/16/2020  . Cerebral infarction, watershed distribution, unilateral, chronic 03/16/2020  . Acute renal failure (HCC)   . History of CVA in adulthood   . Dyslipidemia   . CVA (cerebral vascular accident) ([facility]) 03/11/2020  . Leukocytosis   . Hemiparesis affecting dominant side as late effect of stroke (HCC)   . Labile blood pressure   . Sleep disturbance   . Benign hypertensive heart and kidney disease with diastolic CHF, NYHA class II and CKD stage III (HCC)   . Chronic systolic congestive heart failure (HCC)   . Morbid obesity (HCC)   . Uncontrolled hypertension   . Dysphagia, post-stroke   . Cardiomegaly   . Acute systolic congestive heart failure (HCC)   . 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 Cerebral embolism with cerebral infarction 07/11/2019  . Acute CVA (cerebrovascular accident) (HCC) 07/11/2019  . Slurred speech 07/10/2019  . Cardiomyopathy (HCC) 11/20/2018  . History of stroke 09/06/2018  . Tobacco use 09/06/2018  . History of non-ST elevation myocardial infarction (NSTEMI) 05/05/2018  . Severe Vitamin D deficiency 04/02/2018  . Community acquired pneumonia of left lower lobe of lung 04/02/2018  . CKD (chronic kidney disease), stage III (HCC) = Secondary to Hypertension 04/02/2018  . Pneumonia 03/30/2018  . Metabolic  acidosis 03/30/2018  . AKI (acute kidney injury) (HCC) 03/30/2018  . N&V (nausea and vomiting) 03/30/2018  . Essential hypertension 03/30/2018    Kelli Churn, OTR/L 04/28/2020, 3:28 PM  [facility] [address] [place], [place], [address] Phone: [phone]   Fax:  [phone]  Name: Zachary Mccann MRN: [id number] Date of Birth: Aug 13, 1971

## 2020-04-28 NOTE — Therapy (Signed)
[facility] [address] [place], [place], [address] Phone: [phone]   Fax:  [phone]  Speech Language Pathology Treatment  Patient Details  Name: Zachary Mccann MRN: [id number] Date of Birth: Oct 06, 1971 Referring Provider (SLP): Maryla Morrow, MD   Encounter Date: 04/28/2020  End of Session - 04/28/20 1241    Visit Number  2    Number of Visits  5    Date for SLP Re-Evaluation  05/27/20    SLP Start Time  0946   15 min late   SLP Stop Time   1015    SLP Time Calculation (min)  29 min       Past Medical History:  Diagnosis Date  . CAD (coronary artery disease)    a. s/p NSTEMI in 04/2018 with DES to LCx.   [name] Hypertension   . Myocardial infarction (HCC)   . Pneumonia   . Stroke [facility])     Past Surgical History:  Procedure Laterality Date  . CORONARY STENT INTERVENTION N/A 05/05/2018   Procedure: CORONARY STENT INTERVENTION;  Surgeon: Marykay Lex, MD;  Location: [facility] INVASIVE CV LAB;  Service: Cardiovascular;  Laterality: N/A;  . LEFT HEART CATH AND CORONARY ANGIOGRAPHY N/A 05/05/2018   Procedure: LEFT HEART CATH AND CORONARY ANGIOGRAPHY;  Surgeon: Marykay Lex, MD;  Location: [facility] INVASIVE CV LAB;  Service: Cardiovascular;  Laterality: N/A;  . TEE WITHOUT CARDIOVERSION N/A 09/08/2018   Procedure: TRANSESOPHAGEAL ECHOCARDIOGRAM (TEE);  Surgeon: Laurey Morale, MD;  Location: [facility] ENDOSCOPY;  Service: Cardiovascular;  Laterality: N/A;    There were no vitals filed for this visit.  Subjective Assessment - 04/28/20 0946    Subjective  gives thumbs up    Currently in Pain?  No/denies            ADULT SLP TREATMENT - 04/28/20 0946      General Information   Behavior/Cognition  Alert;Cooperative      Treatment Provided   Treatment provided  Cognitive-Linquistic      Pain Assessment   Pain Assessment  No/denies pain      Cognitive-Linquistic Treatment   Treatment focused on   Dysarthria;Apraxia    Skilled Treatment  Patient arrived 15 minutes late; girlfriend dropped pt off and did not stay. SLP provided HEP for oral strengthening and trained pt in exercises for speech. SLP used mirror for visual feedback with pt, which improved accuracy however pt did have difficulty intermittently due to apraxia. More success when SLP cued pt to do more slowly. Pt did not bring his AAC board today; used letter board in session today to clarify when SLP did not understand reply with mod cues. Abdominal breathing training due to decreased breath support for utterances. Pt accuracy at rest over 2 min after initial mod cues was 90%. SLP asked pt to generate list of personally relevant phrases and sentences with his girlfriend to bring to next session.       Assessment / Recommendations / Plan   Plan  Continue with current plan of care      Progression Toward Goals   Progression toward goals  Progressing toward goals       SLP Education - 04/28/20 1241    Education Details  HEP; frequency/duration    Person(s) Educated  Patient    Methods  Explanation;Demonstration;Tactile cues;Verbal cues;Handout    Comprehension  Verbalized understanding;Returned demonstration;Need further instruction         SLP Long Term Goals - 04/28/20  4098      SLP LONG TERM GOAL #1   Title  Pt will complete HEP for oral strengthening    Baseline  not provided    Time  4    Period  Weeks    Status  On-going      SLP LONG TERM GOAL #2   Title  pt will use compensations to improve sentence (at least 8 words) intelligibility to 30%    Baseline  <20%    Time  4    Period  Weeks    Status  On-going      SLP LONG TERM GOAL #3   Title  pt will utilize low tech speech compenstory device PRN in 10 minutes conversation, 85% successfully, in 2 sessions    Baseline  not utilized yet    Time  4    Period  Weeks    Status  On-going       Plan - 04/28/20 1242    Clinical Impression Statement  Pt  presents with significant oral speech deficits including severe dysarthria (R47.1) and likely verbal apraxia (R48.2). Pt is mildly concerned about his speech and more concerned about his walking/mobility and desires more of his sessions ultimately be put towards PT.He would benefit from a short course of ST targeting home exercises and compensations for his speech intelligibilty.    Speech Therapy Frequency  1x /week    Duration  4 weeks   pt would like to put most of his visits into PT      Patient will benefit from skilled therapeutic intervention in order to improve the following deficits and impairments:   Dysarthria and anarthria  Verbal apraxia    Problem List Patient Active Problem List   Diagnosis Date Noted  . Dysarthria, post-stroke 04/07/2020  . 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  . Hyperglycemia   . Anemia   . Acute pain of right knee   . Primary osteoarthritis of right knee   . Expressive language impairment   . Benign essential HTN   . Acute bilat watershed infarction [facility]) 03/16/2020  . Cerebral infarction, watershed distribution, unilateral, chronic 03/16/2020  . Acute renal failure (HCC)   . History of CVA in adulthood   . Dyslipidemia   . CVA (cerebral vascular accident) (HCC) 03/11/2020  . Leukocytosis   . Hemiparesis affecting dominant side as late effect of stroke (HCC)   . Labile blood pressure   . Sleep disturbance   . Benign hypertensive heart and kidney disease with diastolic CHF, NYHA class II and CKD stage III (HCC)   . Chronic systolic congestive heart failure (HCC)   . Morbid obesity (HCC)   . Uncontrolled hypertension   . Dysphagia, post-stroke   . Cardiomegaly   . Acute systolic congestive heart failure (HCC)   . 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 Cerebral embolism with cerebral infarction 07/11/2019  . Acute CVA (cerebrovascular accident) (HCC) 07/11/2019  . Slurred speech 07/10/2019  . Cardiomyopathy (HCC) 11/20/2018   . History of stroke 09/06/2018  . Tobacco use 09/06/2018  . History of non-ST elevation myocardial infarction (NSTEMI) 05/05/2018  . Severe Vitamin D deficiency 04/02/2018  . Community acquired pneumonia of left lower lobe of lung 04/02/2018  . CKD (chronic kidney disease), stage III (HCC) = Secondary to Hypertension 04/02/2018  . Pneumonia 03/30/2018  . Metabolic acidosis 03/30/2018  . AKI (acute kidney injury) (HCC) 03/30/2018  . N&V (nausea and vomiting) 03/30/2018  . Essential hypertension 03/30/2018  Deneise Lever, [place], [place] 04/28/2020, 12:43 PM  [place] [address] [place], [place], [address] Phone: [phone]   Fax:  [phone]   Name: DRYDEN TAPLEY MRN: [id number] Date of Birth: 1971-01-24

## 2020-04-28 NOTE — Patient Instructions (Addendum)
  LIP and TONGUE exercises  --- DO ALL EXERCISES TWICE EACH DAY  1. Lip press - Press your lips together firmly (like making a /m/ sound) and hold 5 seconds -repeat 10 times  2. LOUD and LONG Pryor Montes - make a kissing sound as loud as you can, as long as you can - repeat 10 times  3. Tongue sweep - Sweep your tongue in the pockets of your mouth - touch every tooth  - five revolutions each way  4. PUH! - 10 times       MAKE THE SOUNDS STRONG      TUH - 10 times      KUH! - 10 times  5. Say "OOOOO", and "EEEEE" 10 times (Kiss and smile)  6. Put air in your cheeks, and push on either side 10 times  *KEEP THE AIR IN and DON'T LET IT ESCAPE!!* If you are having trouble moving the air back and forth, just start by puffing up and holding air on one side at a time.  7. Big breath in, and then shout "Hey!" 10 times  _____________________________________________________________________ Abdominal Breathing exercises 15 minutes, twice a day.   . Shoulders down - this is a cue to relax . Place your hand on your abdomen - this helps you focus on easy abdominal breath support - the best and most relaxed way to breathe . Breathe in through your nose and fill your belly with air, watching your hand move outward . Breathe out through your mouth and watch your belly move in. An audible "sh"  may help   Think of your belly as a balloon, when you fill with air (inhale), the balloon gets bigger. As the air goes out (exhale), the balloon deflates.  Practice breathing in and out in front of a mirror, watching your belly Breathe in for a count of 5 and breathe out for a count of 5  __________________________________________________________________  Let's make a list of 10 phrases or short sentences that are important to you to say to other people. Ask your girlfriend to help you and write them below. (ex. "What did you catch?" "I love you." ) Bring it with you next time. We will work on practicing  these.

## 2020-04-28 NOTE — Patient Instructions (Signed)
  1. Flexion (Assistive)    Hold Rt wrist with Lt hand (thumb side up) and raise arms above head, keeping elbows as straight as possible. Can be done sitting or lying. THUMB SIDE UP Repeat _10___ times. Do _2-3___ sessions per day.   2. ROM: Flexion    Place BOTH hands on table with folded towel underneath, slide arms forward along table until stretch is felt. Hold __3__ seconds. Repeat _10___ times per set.  Do __2-3__ sessions per day.  3. 1. Grip Strengthening (Resistive Putty)   Squeeze putty using thumb and all fingers. Repeat _20___ times. Do __2__ sessions per day.   4. Roll putty into tube on table and pinch between each first two fingers and thumb x 10 reps. Do 2 sessions per day.   5. Grasp cup and bring to mouth then replace on table and fully release. Repeat 10 times. 2 sessions per day. AS YOU GET BETTER - ADD SOME WATER AND TRY AND DRINK IT  6. Stack and upstack plastic cups (same size). Repeat 10 times. 2 sessions per day.   7. Flip cards over one at a time. OPEN FINGERS WIDE before getting another card. Go through at least 1/2 of the deck. Perform 2 sessions per day.   8. Place checkers in a cup. Repeat 10-20 times. Do 2 sessions per day (If this is easy, you can use coins. As shoulder movement improves, raise cup higher by placing thick book under it)  9. If you can purchase a "Connect 4" game - place checkers into slots.

## 2020-05-03 ENCOUNTER — Encounter: Payer: Medicaid Other | Admitting: Speech Pathology

## 2020-05-03 ENCOUNTER — Ambulatory Visit: Payer: Medicaid Other | Admitting: Physical Therapy

## 2020-05-04 ENCOUNTER — Institutional Professional Consult (permissible substitution): Payer: Medicaid Other | Admitting: Neurology

## 2020-05-05 ENCOUNTER — Ambulatory Visit: Payer: Medicaid Other | Admitting: Speech Pathology

## 2020-05-05 ENCOUNTER — Ambulatory Visit: Payer: Medicaid Other

## 2020-05-05 ENCOUNTER — Other Ambulatory Visit: Payer: Self-pay

## 2020-05-05 ENCOUNTER — Ambulatory Visit: Payer: Medicaid Other | Admitting: Occupational Therapy

## 2020-05-05 DIAGNOSIS — M6281 Muscle weakness (generalized): Secondary | ICD-10-CM

## 2020-05-05 DIAGNOSIS — R471 Dysarthria and anarthria: Secondary | ICD-10-CM

## 2020-05-05 DIAGNOSIS — I69353 Hemiplegia and hemiparesis following cerebral infarction affecting right non-dominant side: Secondary | ICD-10-CM

## 2020-05-05 DIAGNOSIS — R278 Other lack of coordination: Secondary | ICD-10-CM

## 2020-05-05 DIAGNOSIS — R482 Apraxia: Secondary | ICD-10-CM

## 2020-05-05 NOTE — Therapy (Signed)
[facility] [address] [place], [place], [address] Phone: [phone]   Fax:  [phone]  Speech Language Pathology Treatment  Patient Details  Name: Zachary Mccann MRN: [id number] Date of Birth: 15-Jun-1971 Referring Provider (SLP): Maryla Morrow, MD   Encounter Date: 05/05/2020  End of Session - 05/05/20 1331    Visit Number  3    Number of Visits  5    Date for SLP Re-Evaluation  05/27/20    SLP Start Time  0936    SLP Stop Time   1015    SLP Time Calculation (min)  39 min    Activity Tolerance  Patient tolerated treatment well       Past Medical History:  Diagnosis Date  . CAD (coronary artery disease)    a. s/p NSTEMI in 04/2018 with DES to LCx.   [name] Hypertension   . Myocardial infarction (HCC)   . Pneumonia   . Stroke [facility])     Past Surgical History:  Procedure Laterality Date  . CORONARY STENT INTERVENTION N/A 05/05/2018   Procedure: CORONARY STENT INTERVENTION;  Surgeon: Marykay Lex, MD;  Location: [facility] INVASIVE CV LAB;  Service: Cardiovascular;  Laterality: N/A;  . LEFT HEART CATH AND CORONARY ANGIOGRAPHY N/A 05/05/2018   Procedure: LEFT HEART CATH AND CORONARY ANGIOGRAPHY;  Surgeon: Marykay Lex, MD;  Location: [facility] INVASIVE CV LAB;  Service: Cardiovascular;  Laterality: N/A;  . TEE WITHOUT CARDIOVERSION N/A 09/08/2018   Procedure: TRANSESOPHAGEAL ECHOCARDIOGRAM (TEE);  Surgeon: Laurey Morale, MD;  Location: [facility] ENDOSCOPY;  Service: Cardiovascular;  Laterality: N/A;    There were no vitals filed for this visit.  Subjective Assessment - 05/05/20 0940    Subjective  "6" (holds up 6 fingers) when SLP asked about pt's shirt    Currently in Pain?  No/denies            ADULT SLP TREATMENT - 05/05/20 0940      General Information   Behavior/Cognition  Alert;Cooperative      Treatment Provided   Treatment provided  Cognitive-Linquistic      Pain Assessment   Pain Assessment   No/denies pain      Cognitive-Linquistic Treatment   Treatment focused on  Dysarthria;Apraxia    Skilled Treatment  Patient alone for session. Reports he has practiced HEP and abdominal breathing "some." Pt required usual mod cues with HEP, with mixed success due to apraxia. Has difficulty with abdominal breathing in isolation, however SLP successful with modeling deep breath and personally relevant words (names of family/friends) for timing of phonation onset. Only 50% of names intelligible to SLP; pt used letter board with min cues to clarify when requested. Introduced compensations for dysarthria (SLOP strategies). in 3-5 word phrases, pt required usual mod A initially for pausing, slower rate, louder speech. SLP explained to pt that pausing between words is critical in pt's case given combination of dysarthria, apraxia. Explained that initial and final consonants give the listener clues about what pt is saying. By end of exercise, pt implementing pauses with rare min A, about 80% of the time.      Assessment / Recommendations / Plan   Plan  Continue with current plan of care      Progression Toward Goals   Progression toward goals  Progressing toward goals       SLP Education - 05/05/20 1331    Education Details  SLOP strategies    Person(s) Educated  Patient  Methods  Explanation;Demonstration;Verbal cues;Handout    Comprehension  Verbalized understanding;Returned demonstration;Need further instruction         SLP Long Term Goals - 05/05/20 1004      SLP LONG TERM GOAL #1   Title  Pt will complete HEP for oral strengthening    Time  3    Period  Weeks    Status  On-going      SLP LONG TERM GOAL #2   Title  pt will use compensations to improve sentence (at least 8 words) intelligibility to 30%    Baseline  <20%    Time  3    Period  Weeks    Status  On-going      SLP LONG TERM GOAL #3   Title  pt will utilize low tech speech compenstory device PRN in 10 minutes  conversation, 85% successfully, in 2 sessions    Baseline  not utilized yet    Time  3    Period  Weeks    Status  On-going       Plan - 05/05/20 1331    Clinical Impression Statement  Pt presents with significant oral speech deficits including severe dysarthria (R47.1) and verbal apraxia (R48.2). Pt is mildly concerned about his speech and more concerned about his walking/mobility and desires more of his sessions ultimately be put towards PT. Apraxia limits success with HEP and abdominal breathing. Pt required mod cues with SLOP strategies in 3-5 word phrases today (50% intelligible when reading phrases from a list familiar to SLP). He would benefit from a short course of ST targeting home exercises and compensations for his speech intelligibilty.    Speech Therapy Frequency  1x /week    Duration  4 weeks   pt would like to put most of his visits into PT      Patient will benefit from skilled therapeutic intervention in order to improve the following deficits and impairments:   Dysarthria and anarthria  Verbal apraxia    Problem List Patient Active Problem List   Diagnosis Date Noted  . Dysarthria, post-stroke 04/07/2020  . 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  . Hyperglycemia   . Anemia   . Acute pain of right knee   . Primary osteoarthritis of right knee   . Expressive language impairment   . Benign essential HTN   . Acute bilat watershed infarction [facility]) 03/16/2020  . Cerebral infarction, watershed distribution, unilateral, chronic 03/16/2020  . Acute renal failure ([place])   . History of CVA in adulthood   . Dyslipidemia   . CVA (cerebral vascular accident) ([place]) 03/11/2020  . Leukocytosis   . Hemiparesis affecting dominant side as late effect of stroke ([place])   . Labile blood pressure   . Sleep disturbance   . Benign hypertensive heart and kidney disease with diastolic CHF, NYHA class II and CKD stage III ([place])   . Chronic systolic congestive heart failure ([place])   .  Morbid obesity ([place])   . Uncontrolled hypertension   . Dysphagia, post-stroke   . Cardiomegaly   . Acute systolic congestive heart failure ([place])   . Left middle cerebral artery stroke ([place]) 07/17/2019  . Cerebral embolism with cerebral infarction 07/11/2019  . Acute CVA (cerebrovascular accident) ([place]) 07/11/2019  . Slurred speech 07/10/2019  . Cardiomyopathy ([place]) 11/20/2018  . History of stroke 09/06/2018  . Tobacco use 09/06/2018  . History of non-ST elevation myocardial infarction (NSTEMI) 05/05/2018  . Severe Vitamin D deficiency 04/02/2018  . Community  acquired pneumonia of left lower lobe of lung 04/02/2018  . CKD (chronic kidney disease), stage III (HCC) = Secondary to Hypertension 04/02/2018  . Pneumonia 03/30/2018  . Metabolic acidosis 03/30/2018  . AKI (acute kidney injury) (HCC) 03/30/2018  . N&V (nausea and vomiting) 03/30/2018  . Essential hypertension 03/30/2018   Rondel Baton, MS, CCC-SLP Speech-Language Pathologist  Arlana Lindau 05/05/2020, 1:33 PM  [facility] [address] [place], [place], [address] Phone: [phone]   Fax:  [phone]   Name: JVON MERONEY MRN: [id number] Date of Birth: Apr 01, 1971

## 2020-05-05 NOTE — Therapy (Signed)
[place] [facility] [address] [place], [place], [address] Phone: [phone]   Fax:  [phone]  Occupational Therapy Treatment  Patient Details  Name: Zachary Mccann MRN: [id number] Date of Birth: 11/24/1971 No data recorded  Encounter Date: 05/05/2020  OT End of Session - 05/05/20 1113    Visit Number  3    Number of Visits  11    Date for OT Re-Evaluation  06/28/20    Authorization Type  MCD    Authorization Time Period  Approved 10 visits from 5/13 - 07/06/20    Authorization - Visit Number  2    Authorization - Number of Visits  10    OT Start Time  1015    OT Stop Time  1100    OT Time Calculation (min)  45 min    Activity Tolerance  Patient tolerated treatment well    Behavior During Therapy  [facility] for tasks assessed/performed       Past Medical History:  Diagnosis Date  . CAD (coronary artery disease)    a. s/p NSTEMI in 04/2018 with DES to LCx.   [name] Hypertension   . Myocardial infarction ([place])   . Pneumonia   . Stroke [facility])     Past Surgical History:  Procedure Laterality Date  . CORONARY STENT INTERVENTION N/A 05/05/2018   Procedure: CORONARY STENT INTERVENTION;  Surgeon: Leonie Man, MD;  Location: [place] CV LAB;  Service: Cardiovascular;  Laterality: N/A;  . LEFT HEART CATH AND CORONARY ANGIOGRAPHY N/A 05/05/2018   Procedure: LEFT HEART CATH AND CORONARY ANGIOGRAPHY;  Surgeon: Leonie Man, MD;  Location: [place] CV LAB;  Service: Cardiovascular;  Laterality: N/A;  . TEE WITHOUT CARDIOVERSION N/A 09/08/2018   Procedure: TRANSESOPHAGEAL ECHOCARDIOGRAM (TEE);  Surgeon: Larey Dresser, MD;  Location: [facility] ENDOSCOPY;  Service: Cardiovascular;  Laterality: N/A;    There were no vitals filed for this visit.  Subjective Assessment - 05/05/20 1023    Subjective   Denies pain    Pertinent History  New CVA 03/11/20 w/ increased weakness on Rt side. Lt CVA w/ Rt hemiparesis July 2020, HTN, CAD,  MI, s/p NSTEMI May 2019.    Limitations  max assist of 2 to stand, fall risk, aphasic    Currently in Pain?  No/denies       Pt required max assist x 2 to perform stand pivot transfer to mat towards strong Lt side - pt required cueing to move Lt unaffected foot due to apraxia. Pt required max assist x 1 and mod assist x 1 to perform squat pivot transfer back to w/c towards weaker Rt side at end of session.  Reviewed HEP from last session. Pt required cueing to extend elbow t/o session and during HEP and to fully open hand prior to flipping cards.  Sit to supine w/ max assist x 1. Supine: Performed self ROM for sh flexion w/ cues for elbow extension RUE. Progressed to chest press motion bilateral UE's using PVC frame w/ mod tactile cues RUE. Elbow ext against gravity RUE in supine w/ only min difficulty.  Supine to sit w/ mod assist x1 - performed bilateral low level reaching to grasp large ball with mod assist for elbow extension, however when performing low level reaching unilaterally RUE, pt could achieve sufficient elbow ext for task (? D/t apraxia)                      OT  Short Term Goals - 05/05/20 1113      OT SHORT TERM GOAL #1   Title  Independent with HEP    Baseline  dependent, not yet issued    Time  5    Period  Weeks    Status  Achieved      OT SHORT TERM GOAL #2   Title  Pt to perform 50* or greater shoulder flexion RUE for low level reaching/assist w/ ADLS    Baseline  30*    Time  5    Period  Weeks    Status  On-going      OT SHORT TERM GOAL #3   Title  Pt to perform UE dressing I'ly    Baseline  mod assist    Time  5    Period  Weeks    Status  New      OT SHORT TERM GOAL #4   Title  Pt to perform transfer to [facility] with max assist x 1    Baseline  total dependence    Time  5    Period  Weeks    Status  New      OT SHORT TERM GOAL #5   Title  Pt to improve RUE function as evidenced by performing 15 blocks on Box & Blocks test    Baseline   Rt = 9    Time  5    Period  Weeks    Status  New        OT Long Term Goals - 04/19/20 1459      OT LONG TERM GOAL #1   Title  Pt to perform LE bathing and dressing with mod assist (max assist to stand to pull up over hips) with A/E prn    Baseline  max assist to total dependence    Time  10    Period  Weeks    Status  New      OT LONG TERM GOAL #2   Title  Pt to perform level transfers with mod assist    Baseline  max assist x 2, or total dependence    Time  10    Period  Weeks    Status  New      OT LONG TERM GOAL #3   Title  Pt to perform RUE shoulder flexion to 70* or greater for consistent low level reaching    Baseline  30*    Time  10    Period  Weeks    Status  New      OT LONG TERM GOAL #4   Title  Grip strength Rt hand to be 20 lbs or greater to assist with opening containers    Baseline  10 lbs    Time  10    Period  Weeks    Status  New      OT LONG TERM GOAL #5   Title  Pt to make simple snack from w/c level, standing at counter to retrieve items from higher shelves    Baseline  dependent - pt only getting snack from w/c level    Time  10    Period  Weeks    Status  New            Plan - 05/05/20 1113    Clinical Impression Statement  Pt met STG #1 and progressing with low level reaching RUE. Pt apraxic and even noted difficulty moving Lt unaffected side w/ direction  during transfer    Occupational performance deficits (Please refer to evaluation for details):  ADL's;IADL's;Social Participation    Body Structure / Function / Physical Skills  ADL;ROM;IADL;Body mechanics;Endurance;Sensation;Mobility;Flexibility;Strength;Coordination;FMC;Obesity;UE functional use;Decreased knowledge of precautions;Decreased knowledge of use of DME    Rehab Potential  Fair    OT Frequency  1x / week    OT Duration  --   10 weeks   OT Treatment/Interventions  Self-care/ADL training;Therapeutic exercise;Functional Mobility Training;Neuromuscular  education;Splinting;Manual Therapy;Therapeutic activities;Coping strategies training;DME and/or AE instruction;Cognitive remediation/compensation;Visual/perceptual remediation/compensation;Passive range of motion;Patient/family education    Plan  work on UE dressing, transfers    Consulted and Agree with Plan of Care  Patient       Patient will benefit from skilled therapeutic intervention in order to improve the following deficits and impairments:   Body Structure / Function / Physical Skills: ADL, ROM, IADL, Body mechanics, Endurance, Sensation, Mobility, Flexibility, Strength, Coordination, FMC, Obesity, UE functional use, Decreased knowledge of precautions, Decreased knowledge of use of DME       Visit Diagnosis: Hemiplegia and hemiparesis following cerebral infarction affecting right non-dominant side (HCC)  Other lack of coordination  Muscle weakness (generalized)    Problem List Patient Active Problem List   Diagnosis Date Noted  . Dysarthria, post-stroke 04/07/2020  . 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  . Hyperglycemia   . Anemia   . Acute pain of right knee   . Primary osteoarthritis of right knee   . Expressive language impairment   . Benign essential HTN   . Acute bilat watershed infarction [facility]) 03/16/2020  . Cerebral infarction, watershed distribution, unilateral, chronic 03/16/2020  . Acute renal failure ([place])   . History of CVA in adulthood   . Dyslipidemia   . CVA (cerebral vascular accident) ([place]) 03/11/2020  . Leukocytosis   . Hemiparesis affecting dominant side as late effect of stroke ([place])   . Labile blood pressure   . Sleep disturbance   . Benign hypertensive heart and kidney disease with diastolic CHF, NYHA class II and CKD stage III ([place])   . Chronic systolic congestive heart failure ([place])   . Morbid obesity ([place])   . Uncontrolled hypertension   . Dysphagia, post-stroke   . Cardiomegaly   . Acute systolic congestive heart failure ([place])   .  Left middle cerebral artery stroke ([place]) 07/17/2019  . Cerebral embolism with cerebral infarction 07/11/2019  . Acute CVA (cerebrovascular accident) ([place]) 07/11/2019  . Slurred speech 07/10/2019  . Cardiomyopathy ([place]) 11/20/2018  . History of stroke 09/06/2018  . Tobacco use 09/06/2018  . History of non-ST elevation myocardial infarction (NSTEMI) 05/05/2018  . Severe Vitamin D deficiency 04/02/2018  . Community acquired pneumonia of left lower lobe of lung 04/02/2018  . CKD (chronic kidney disease), stage III ([place]) = Secondary to Hypertension 04/02/2018  . Pneumonia 03/30/2018  . Metabolic acidosis 17/71/1657  . AKI (acute kidney injury) ([place]) 03/30/2018  . N&V (nausea and vomiting) 03/30/2018  . Essential hypertension 03/30/2018    Carey Bullocks, OTR/L 05/05/2020, 1:20 PM  [place] [address] [place], [place], [address] Phone: [phone]   Fax:  [phone]  Name: Zachary Mccann MRN: [id number] Date of Birth: 1971/12/15

## 2020-05-10 ENCOUNTER — Ambulatory Visit: Payer: Medicaid Other | Admitting: Speech Pathology

## 2020-05-10 ENCOUNTER — Other Ambulatory Visit: Payer: Self-pay

## 2020-05-10 ENCOUNTER — Ambulatory Visit: Payer: Medicaid Other

## 2020-05-10 DIAGNOSIS — R278 Other lack of coordination: Secondary | ICD-10-CM

## 2020-05-10 DIAGNOSIS — R482 Apraxia: Secondary | ICD-10-CM

## 2020-05-10 DIAGNOSIS — I69353 Hemiplegia and hemiparesis following cerebral infarction affecting right non-dominant side: Secondary | ICD-10-CM | POA: Diagnosis not present

## 2020-05-10 DIAGNOSIS — M6281 Muscle weakness (generalized): Secondary | ICD-10-CM

## 2020-05-10 DIAGNOSIS — R2681 Unsteadiness on feet: Secondary | ICD-10-CM

## 2020-05-10 DIAGNOSIS — R2689 Other abnormalities of gait and mobility: Secondary | ICD-10-CM

## 2020-05-10 DIAGNOSIS — R471 Dysarthria and anarthria: Secondary | ICD-10-CM

## 2020-05-10 NOTE — Therapy (Signed)
[facility] [address] [place], [place], [address] Phone: [phone]   Fax:  [phone]  Speech Language Pathology Treatment  Patient Details  Name: Zachary Mccann MRN: [id number] Date of Birth: 10/31/1971 Referring Provider (SLP): Maryla Morrow, MD   Encounter Date: 05/10/2020  End of Session - 05/10/20 1117    Visit Number  4    Number of Visits  5    Date for SLP Re-Evaluation  05/27/20    SLP Start Time  0932    SLP Stop Time   1015    SLP Time Calculation (min)  43 min    Activity Tolerance  Patient tolerated treatment well       Past Medical History:  Diagnosis Date  . CAD (coronary artery disease)    a. s/p NSTEMI in 04/2018 with DES to LCx.   [name] Hypertension   . Myocardial infarction (HCC)   . Pneumonia   . Stroke [facility])     Past Surgical History:  Procedure Laterality Date  . CORONARY STENT INTERVENTION N/A 05/05/2018   Procedure: CORONARY STENT INTERVENTION;  Surgeon: Marykay Lex, MD;  Location: [facility] INVASIVE CV LAB;  Service: Cardiovascular;  Laterality: N/A;  . LEFT HEART CATH AND CORONARY ANGIOGRAPHY N/A 05/05/2018   Procedure: LEFT HEART CATH AND CORONARY ANGIOGRAPHY;  Surgeon: Marykay Lex, MD;  Location: [facility] INVASIVE CV LAB;  Service: Cardiovascular;  Laterality: N/A;  . TEE WITHOUT CARDIOVERSION N/A 09/08/2018   Procedure: TRANSESOPHAGEAL ECHOCARDIOGRAM (TEE);  Surgeon: Laurey Morale, MD;  Location: [facility] ENDOSCOPY;  Service: Cardiovascular;  Laterality: N/A;    There were no vitals filed for this visit.  Subjective Assessment - 05/10/20 0937    Subjective  "Sat around."    Currently in Pain?  Yes    Pain Score  6     Pain Location  Ankle    Pain Orientation  Right;Left    Pain Descriptors / Indicators  Other (Comment)   "swell"           ADULT SLP TREATMENT - 05/10/20 0939      General Information   Behavior/Cognition  Alert;Cooperative      Treatment Provided   Treatment provided  Cognitive-Linquistic      Pain Assessment   Pain Assessment  No/denies pain      Cognitive-Linquistic Treatment   Treatment focused on  Dysarthria;Apraxia    Skilled Treatment  Pt has not worked on speech exercises since last session. SLP explained rationale for daily practice and acknowledged some of pt's barriers to practicing (has 6 kids at home, frustration, his primary focus is on physical impairments at this time). SLP discussed with pt ideas for practice within his daily routine, such as talking with/playing games with his kids. Pt required usual mod cues/demonstration for breath support prior to voicing, SLOP strategies fading to rare min A for 1-3 word responses re: personal interests/likes/dislikes. Pt required occasional min A to use AAC to spell when breakdowns occurred. Progressed to simple conversation re: vacations, again with occasional min A for AAC use for breakdowns.       Assessment / Recommendations / Plan   Plan  Continue with current plan of care      Progression Toward Goals   Progression toward goals  Not progressing toward goals (comment)   not practicing outside of ST      SLP Education - 05/10/20 1117    Education Details  opportunities for practice within daily routine  Person(s) Educated  Patient    Methods  Explanation;Handout    Comprehension  Verbalized understanding;Verbal cues required;Need further instruction         SLP Long Term Goals - 05/10/20 1119      SLP LONG TERM GOAL #1   Title  Pt will complete HEP for oral strengthening    Time  2    Period  Weeks    Status  On-going      SLP LONG TERM GOAL #2   Title  pt will use compensations to improve sentence (at least 8 words) intelligibility to 30%    Baseline  <20%    Time  2    Period  Weeks    Status  On-going      SLP LONG TERM GOAL #3   Title  pt will utilize low tech speech compenstory device PRN in 10 minutes conversation, 85% successfully, in 2 sessions     Baseline  not utilized yet    Time  2    Period  Weeks    Status  On-going       Plan - 05/10/20 1118    Clinical Impression Statement  Pt presents with significant oral speech deficits including severe dysarthria (R47.1) and verbal apraxia (R48.2). Pt is mildly concerned about his speech and more concerned about his walking/mobility and desires more of his sessions ultimately be put towards PT. Apraxia limits success with HEP and abdominal breathing; pt also reported today that he is not practicing these at home. Pt required mod cues with SLOP strategies in 3-5 word responses today; also requires cues to use AAC when breakdowns occur. He would benefit from a short course of ST targeting home exercises and compensations for his speech intelligibilty.    Speech Therapy Frequency  1x /week    Duration  4 weeks   pt would like to put most of his visits into PT      Patient will benefit from skilled therapeutic intervention in order to improve the following deficits and impairments:   Dysarthria and anarthria  Verbal apraxia    Problem List Patient Active Problem List   Diagnosis Date Noted  . Dysarthria, post-stroke 04/07/2020  . 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  . Hyperglycemia   . Anemia   . Acute pain of right knee   . Primary osteoarthritis of right knee   . Expressive language impairment   . Benign essential HTN   . Acute bilat watershed infarction [facility]) 03/16/2020  . Cerebral infarction, watershed distribution, unilateral, chronic 03/16/2020  . Acute renal failure (HCC)   . History of CVA in adulthood   . Dyslipidemia   . CVA (cerebral vascular accident) (HCC) 03/11/2020  . Leukocytosis   . Hemiparesis affecting dominant side as late effect of stroke (HCC)   . Labile blood pressure   . Sleep disturbance   . Benign hypertensive heart and kidney disease with diastolic CHF, NYHA class II and CKD stage III (HCC)   . Chronic systolic congestive heart failure (HCC)   .  Morbid obesity (HCC)   . Uncontrolled hypertension   . Dysphagia, post-stroke   . Cardiomegaly   . Acute systolic congestive heart failure (HCC)   . 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 Cerebral embolism with cerebral infarction 07/11/2019  . Acute CVA (cerebrovascular accident) (HCC) 07/11/2019  . Slurred speech 07/10/2019  . Cardiomyopathy (HCC) 11/20/2018  . History of stroke 09/06/2018  . Tobacco use 09/06/2018  . History  of non-ST elevation myocardial infarction (NSTEMI) 05/05/2018  . Severe Vitamin D deficiency 04/02/2018  . Community acquired pneumonia of left lower lobe of lung 04/02/2018  . CKD (chronic kidney disease), stage III ([place]) = Secondary to Hypertension 04/02/2018  . Pneumonia 03/30/2018  . Metabolic acidosis 49/82/6415  . AKI (acute kidney injury) ([place]) 03/30/2018  . N&V (nausea and vomiting) 03/30/2018  . Essential hypertension 03/30/2018   Deneise Lever, [place], [place] 05/10/2020, 11:20 AM  [facility] [address] [place], [place], [address] Phone: [phone]   Fax:  [phone]   Name: Zachary Mccann MRN: [id number] Date of Birth: [birth year]

## 2020-05-10 NOTE — Patient Instructions (Signed)
Practice your speech at home. You can practice your Slow, Loud, Overpronounce, and Pause strategies when you talk to your kids. Remember One. Word. At. A. Time.   Play a guessing game with your kids. Think of a person, place, or thing. Describe it. Use your strategies (SLOP) and see if they can guess. Check out the app Heads Up.

## 2020-05-10 NOTE — Therapy (Signed)
[facility] [address] [place], [place], [address] Phone: [phone]   Fax:  [phone]  Physical Therapy Treatment  Patient Details  Name: Zachary Mccann MRN: [id number] Date of Birth: May 14, 1971 Referring Provider (PT): Referred by Mariam Dollar but neurologist is Maryla Morrow   Encounter Date: 05/10/2020  PT End of Session - 05/10/20 1729    Visit Number  4    Number of Visits  11    Date for PT Re-Evaluation  06/07/20    Authorization Type  Medicaid 4/28-5/18 3 visits, 8 Visits 5/20 -  7/14    Authorization - Visit Number  1    Authorization - Number of Visits  8    PT Start Time  1017    PT Stop Time  1100    PT Time Calculation (min)  43 min    Equipment Utilized During Treatment  Gait belt    Activity Tolerance  Patient tolerated treatment well    Behavior During Therapy  [facility] for tasks assessed/performed       Past Medical History:  Diagnosis Date  . CAD (coronary artery disease)    a. s/p NSTEMI in 04/2018 with DES to LCx.   [name] Hypertension   . Myocardial infarction (HCC)   . Pneumonia   . Stroke [facility])     Past Surgical History:  Procedure Laterality Date  . CORONARY STENT INTERVENTION N/A 05/05/2018   Procedure: CORONARY STENT INTERVENTION;  Surgeon: Marykay Lex, MD;  Location: [facility] INVASIVE CV LAB;  Service: Cardiovascular;  Laterality: N/A;  . LEFT HEART CATH AND CORONARY ANGIOGRAPHY N/A 05/05/2018   Procedure: LEFT HEART CATH AND CORONARY ANGIOGRAPHY;  Surgeon: Marykay Lex, MD;  Location: [facility] INVASIVE CV LAB;  Service: Cardiovascular;  Laterality: N/A;  . TEE WITHOUT CARDIOVERSION N/A 09/08/2018   Procedure: TRANSESOPHAGEAL ECHOCARDIOGRAM (TEE);  Surgeon: Laurey Morale, MD;  Location: [facility] ENDOSCOPY;  Service: Cardiovascular;  Laterality: N/A;    There were no vitals filed for this visit.  Subjective Assessment - 05/10/20 1018    Subjective  Patient reports that he has had some  swelling in both legs, with little amounts of pain. No other complaints.    Currently in Pain?  Yes    Pain Score  6     Pain Location  Ankle    Pain Orientation  Right;Left    Pain Descriptors / Indicators  Aching    Pain Type  Acute pain    Pain Onset  In the past 7 days    Pain Frequency  Intermittent                        [facility] Adult PT Treatment/Exercise - 05/10/20 1040      Bed Mobility   Bed Mobility  Rolling Right;Rolling Left;Sit to Supine;Right Sidelying to Sit    Rolling Right  Supervision/verbal cueing    Rolling Left  Supervision/Verbal cueing    Right Sidelying to Sit  Moderate Assistance - Patient 50-74%   providing assistance at trunk, verbal cues for completion   Sit to Supine  Moderate Assistance - Patient 50-74%   assistance w/ BLE     Transfers   Transfers  Sit to Stand;Stand to Sit;Stand Pivot Transfers    Sit to Stand  1: +2 Total assist;2: Max assist    Sit to Stand Details  Verbal cues for technique    Sit to Stand Details (indicate cue type  and reason)  Patient requiring Mod to Max A +2 today with sit <> stand. Verbal cues for standing upright and tactile cues for improved hip extension    Stand to Sit  4: Min assist    Stand to Sit Details (indicate cue type and reason)  Verbal cues for technique    Stand Pivot Transfers  4: Min assist;3: Mod assist    Stand Pivot Transfer Details (indicate cue type and reason)  mat <> w/c w/ bariatric RW, vebral cues for turn to ensure surface is behind patient prior to descent      Ambulation/Gait   Ambulation/Gait  Yes    Ambulation/Gait Assistance  3: Mod assist;4: Min assist    Ambulation/Gait Assistance Details  Verbal cues for improved step length and foot clearance. w/c follow for safety. PT providing assistance with RLE placement intermittently     Ambulation Distance (Feet)  43 Feet    Assistive device  Rolling walker   Bariatric RW   Gait Pattern  Step-through pattern;Decreased hip/knee  flexion - right;Decreased weight shift to right;Decreased stance time - left    Ambulation Surface  Level;Indoor      Exercises   Exercises  Knee/Hip    Other Exercises   Review current HEP exercises including hook lying ball squeezes 1 x 10 reps with 3-5 sec hold, bridges 1 x 10 reps. PT having to frequently provide support to RLE to keep properly placed. With bridges PT continued to stabilize at right knee, patient able to get slight clearance of right buttocks with today's session. Completed bent knee fall outs with RLE 2 x 10, and focused on concentric return to midline. Patient requiring verbal and tactile cues at time for completion.                PT Short Term Goals - 05/10/20 1732      PT SHORT TERM GOAL #1   Title  Pt will be independent with initial HEP for strengthening, flexibility to continue with wife's assistance.    Baseline  HEP initiated    Time  4    Period  Weeks    Status  On-going    Target Date  05/08/20      PT SHORT TERM GOAL #2   Title  Pt will be CGA with bed mobility for improved function in home.    Baseline  currently still requiring Mod A with bed mobility, CGA for rolling R/L    Time  4    Period  Weeks    Status  On-going    Target Date  05/08/20      PT SHORT TERM GOAL #3   Title  Pt will be able to perform sit to stand from wheelchair mod assist for improved function/functional strength at home.    Baseline  max assist +2    Time  4    Period  Weeks    Status  On-going    Target Date  05/08/20      PT SHORT TERM GOAL #4   Title  Pt will ambulate 51' with RW min assist for improved household mobility.    Baseline  ambualte 59' with with Min A to Mod A with RW    Time  4    Period  Weeks    Status  On-going    Target Date  05/08/20        PT Long Term Goals - 04/08/20 1423      PT  LONG TERM GOAL #1   Title  Pt will be to perform progressive HEP with wife assist to continue gains on own.    Baseline  no current HEP    Time   10    Period  Weeks    Status  New    Target Date  06/17/20      PT LONG TERM GOAL #2   Title  Pt will be able to perform transfers CGA for improved functional strength and mobility.    Baseline  max assist +2    Time  10    Period  Weeks    Status  New    Target Date  06/17/20      PT LONG TERM GOAL #3   Title  Pt will be able to ambulate >100' CGA with RW for further improvement in household mobility.    Baseline  2' min assist to transfer mat to w/c with RW    Time  10    Period  Weeks    Status  New    Target Date  06/17/20      PT LONG TERM GOAL #4   Title  Pt will be able to maintain standing x 5 min with supervision with light UE support on walker or counter for improved balance and participation with ADLs.    Baseline  CGA/Min assist to stand at [facility] briefly    Time  10    Period  Weeks    Status  New    Target Date  06/17/20            Plan - 05/10/20 1731    Clinical Impression Statement  Today's skilled session was focused on finishing assessment of STG's. Patient still requiring Max A +2 with transfers and sit <> stands today. Completed gait training with patient able to ambulate 43 ft with bariatric RW. Reviewed current HEP exercises supine on mat, with verbal cues required for proper completion. Pt will continue to benefit from skilled PT services to progress toward all unmet goals.    Personal Factors and Comorbidities  Comorbidity 3+    Comorbidities  hypertension, tobacco abuse, CAD with non-STEMI 04/2018 as well as left ICA occlusion July 2020    Examination-Activity Limitations  Bathing;Hygiene/Grooming;Stairs;Bed Mobility;Locomotion Level;Stand;Toileting;Transfers    Examination-Participation Restrictions  Community Activity    Stability/Clinical Decision Making  Evolving/Moderate complexity    Rehab Potential  Good    PT Frequency  1x / week    PT Duration  --   10 weeks plus eval   PT Treatment/Interventions  ADLs/Self Care Home Management;Moist  Heat;Electrical Stimulation;Gait training;DME Instruction;Functional mobility training;Stair training;Neuromuscular re-education;Balance training;Therapeutic exercise;Therapeutic activities;Patient/family education;Orthotic Fit/Training;Vestibular;Passive range of motion;Manual techniques;Wheelchair mobility training    PT Next Visit Plan  Continue with transfers, bed mobility, standing and gait.    Consulted and Agree with Plan of Care  Patient       Patient will benefit from skilled therapeutic intervention in order to improve the following deficits and impairments:  Abnormal gait, Decreased activity tolerance, Decreased balance, Decreased endurance, Decreased mobility, Decreased knowledge of use of DME, Decreased range of motion, Decreased strength, Impaired UE functional use, Pain  Visit Diagnosis: Hemiplegia and hemiparesis following cerebral infarction affecting right non-dominant side (HCC)  Other lack of coordination  Muscle weakness (generalized)  Other abnormalities of gait and mobility  Unsteadiness on feet     Problem List Patient Active Problem List   Diagnosis Date Noted  . Dysarthria, post-stroke 04/07/2020  .  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  . Hyperglycemia   . Anemia   . Acute pain of right knee   . Primary osteoarthritis of right knee   . Expressive language impairment   . Benign essential HTN   . Acute bilat watershed infarction [facility]) 03/16/2020  . Cerebral infarction, watershed distribution, unilateral, chronic 03/16/2020  . Acute renal failure (HCC)   . History of CVA in adulthood   . Dyslipidemia   . CVA (cerebral vascular accident) (HCC) 03/11/2020  . Leukocytosis   . Hemiparesis affecting dominant side as late effect of stroke (HCC)   . Labile blood pressure   . Sleep disturbance   . Benign hypertensive heart and kidney disease with diastolic CHF, NYHA class II and CKD stage III (HCC)   . Chronic systolic congestive heart failure (HCC)   .  Morbid obesity (HCC)   . Uncontrolled hypertension   . Dysphagia, post-stroke   . Cardiomegaly   . Acute systolic congestive heart failure (HCC)   . 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 Cerebral embolism with cerebral infarction 07/11/2019  . Acute CVA (cerebrovascular accident) (HCC) 07/11/2019  . Slurred speech 07/10/2019  . Cardiomyopathy (HCC) 11/20/2018  . History of stroke 09/06/2018  . Tobacco use 09/06/2018  . History of non-ST elevation myocardial infarction (NSTEMI) 05/05/2018  . Severe Vitamin D deficiency 04/02/2018  . Community acquired pneumonia of left lower lobe of lung 04/02/2018  . CKD (chronic kidney disease), stage III (HCC) = Secondary to Hypertension 04/02/2018  . Pneumonia 03/30/2018  . Metabolic acidosis 03/30/2018  . AKI (acute kidney injury) (HCC) 03/30/2018  . N&V (nausea and vomiting) 03/30/2018  . Essential hypertension 03/30/2018    Tempie Donning, PT, DPT 05/10/2020, 5:36 PM  [facility] [address] [place], [place], [address] Phone: [phone]   Fax:  [phone]  Name: Zachary Mccann MRN: [id number] Date of Birth: [birth year]

## 2020-05-11 ENCOUNTER — Encounter: Payer: Self-pay | Admitting: Neurology

## 2020-05-11 ENCOUNTER — Ambulatory Visit: Payer: Medicaid Other | Admitting: Neurology

## 2020-05-11 VITALS — BP 126/70 | HR 98

## 2020-05-11 DIAGNOSIS — K5900 Constipation, unspecified: Secondary | ICD-10-CM

## 2020-05-11 DIAGNOSIS — I6389 Other cerebral infarction: Secondary | ICD-10-CM

## 2020-05-11 DIAGNOSIS — I69351 Hemiplegia and hemiparesis following cerebral infarction affecting right dominant side: Secondary | ICD-10-CM | POA: Diagnosis not present

## 2020-05-11 DIAGNOSIS — I63512 Cerebral infarction due to unspecified occlusion or stenosis of left middle cerebral artery: Secondary | ICD-10-CM | POA: Insufficient documentation

## 2020-05-11 DIAGNOSIS — G479 Sleep disorder, unspecified: Secondary | ICD-10-CM

## 2020-05-11 DIAGNOSIS — H53483 Generalized contraction of visual field, bilateral: Secondary | ICD-10-CM | POA: Diagnosis not present

## 2020-05-11 DIAGNOSIS — I10 Essential (primary) hypertension: Secondary | ICD-10-CM

## 2020-05-11 DIAGNOSIS — I69322 Dysarthria following cerebral infarction: Secondary | ICD-10-CM

## 2020-05-11 MED ORDER — ATORVASTATIN CALCIUM 40 MG PO TABS: ORAL_TABLET | ORAL | 0 refills | Status: DC

## 2020-05-11 MED ORDER — AMLODIPINE BESYLATE 10 MG PO TABS: 10.0000 mg | ORAL_TABLET | Freq: Every day | ORAL | 0 refills | Status: DC

## 2020-05-11 MED ORDER — HYDRALAZINE HCL 25 MG PO TABS: 25.0000 mg | ORAL_TABLET | Freq: Three times a day (TID) | ORAL | 3 refills | Status: DC

## 2020-05-11 MED ORDER — CARVEDILOL 6.25 MG PO TABS: 6.2500 mg | ORAL_TABLET | Freq: Two times a day (BID) | ORAL | 0 refills | Status: DC

## 2020-05-11 MED ORDER — CLOPIDOGREL BISULFATE 75 MG PO TABS: 75.0000 mg | ORAL_TABLET | Freq: Every day | ORAL | 3 refills | Status: DC

## 2020-05-11 NOTE — Patient Instructions (Signed)
Rehabilitation After a Stroke, Adult A stroke causes damage to the brain cells, which can affect your ability to walk, talk, or remember things. The impact of a stroke is different for everyone, and so is recovery. Some people have progress during the first few days after treatment. Others may take weeks or longer to make progress. Stroke rehabilitation includes a variety of treatments to help you recover and promote your independence after a stroke. You may not be able do everything that you did before the stroke, but you can learn ways to manage your lifestyle and be as independent as possible. Rehabilitation will start as soon as you are able to participate after your stroke, and it involves care from a team that may include:  Family and friends. Your loved ones know you best and can be very helpful in your recovery.  Physicians.  Nurses.  Physical and occupational therapists.  Speech-language therapists.  A nutritionist.  A psychologist.  A social worker. Keep open communication with all members of your care team. Share your medical records if needed, and take notes about each provider's recommendations. What is physical therapy? Physical therapists (PTs) help you to improve your coordination, balance, and muscle strength. Physical therapy may involve:  Range of motion exercises.  Help to move between lying, sitting, and standing positions.  Walking with a cane or walker, if needed.  Help using stairs. What is occupational therapy? Occupational therapists (OTs) help you rebuild your ability to do everyday tasks, such as brushing your teeth, going to the bathroom, eating, and getting dressed. Occupational therapy may also help with:  Vision. Visual scanning is a technique that is used to prevent falls.  Memory and cognitive training. This therapy includes problem-solving techniques and relearning tasks like making a phone call.  Fine muscle movements such as buttoning a shirt  or picking up small objects. What is speech therapy? Speech-language therapists help you communicate. After a stroke, you may have problems understanding what people are saying, or you may have trouble writing, speaking, or finding the right word for what you want to say. You may also need speech therapy if you have difficulty swallowing while eating and drinking. Examples of speech-language therapies include:  Techniques to strengthen muscles used in swallowing.  Naming objects or describing pictures. This helps retrain the brain to recognize and remember words.  Exercises to strengthen the muscles involved in talking, including your tongue and lips.  Exercises to retrain your brain in understanding what you read and hear. How often will I need therapy? Therapy will begin as soon as you are able to participate, which is often within the first few days after a stroke. Sessions will be frequent at first. For example, you may have therapy 2-3 hours a day on most days of the week during the first few months. The intensity depends on the type and severity of your stroke. You may need therapy for several months. Therapy may take place in the hospital, at a rehabilitation center, or in your home. Are there any side effects of therapy? Therapy is safe and is usually well-tolerated. You may feel physically and mentally tired after therapy, especially during the first few weeks. Rest before therapy sessions if you need to so you can get the most out of your rehabilitation. Follow these instructions at home:  Involve your family and friends in your recovery, if possible. Having another person to encourage you is beneficial.  Follow instructions from your speech-language therapist, nutritionist, or health care   provider about what you can safely eat and drink. Eat healthy foods. If your ability to swallow was affected by the stroke, you may need to take steps to avoid choking, such as: ? Taking small bites  when eating. ? Eating foods that are soft or pureed. ? Drinking liquids that have been thickened.  Maintain social connections and interactions with friends, family, and community groups. This is an important part of your recovery. Communication challenges and physical challenges may cause you to feel isolated after a stroke.  Consider joining a support group that allows you to talk about the impact of stroke on your life. A psychologist or counselor may be recommended. Your emotional recovery from stroke is just as important as your physical recovery.  Keep all follow-up visits as told by your health care providers. This is important. Summary  Stroke rehabilitation includes a variety of treatments to help you recover and promote your independence after a stroke.  Rehabilitation will start as soon as you are able to participate after your stroke, and it includes care from a team of experts.  The intensity of therapy depends on the type and severity of your stroke. You may need therapy for several months. This information is not intended to replace advice given to you by your health care provider. Make sure you discuss any questions you have with your health care provider. Document Revised: 03/25/2019 Document Reviewed: 12/04/2016 [employer] Patient Education  2020 [employer]  

## 2020-05-11 NOTE — Progress Notes (Signed)
SLEEP MEDICINE CLINIC    Provider:  Melvyn Novas, MD  Primary Care Physician:  [name], NP [address] [place]  [place] [address]     Referring Provider: [name], Np [address] [place],  [place] [address]          Chief Complaint according to patient   Patient presents with:    . New Patient (Initial Visit)           HISTORY OF PRESENT ILLNESS:  Zachary Mccann is a 49 y.o. year old Black or [place] American male patient seen here as a referral on 05/11/2020 from [facility] and Dr. Pearlean Brownie, MD   patient : " another stroke hit me"   I have the pleasure of seeing Zachary Mccann today, a left-handed Black or [place] American male with a history of repeated strokes. He  has a past medical history of CAD (coronary artery disease), Hypertension, Myocardial infarction (HCC), Pneumonia, and Stroke (HCC).  Zachary Mccann was seen here on 01-19-2019 in a referral from Dr. Karilyn Cota for diplopia and suspected OSA.  Chief complaint according to patient in 2020 :" The double vision is in my left side " and:  " I snore ".  No longer having diplopia on 05-11-2020, now wheelchair bound with profound right sided weakness.    Zachary Mccann,  Is meanwhile a 49 year old African-America, left-handed gentleman, who presented in [id number]  for a sleep apnea evaluation. He has extensive medical problems,and neurological deficits started related to a first stroke in September 2019. He had a medical myocardial infarction in July 2019 and had a cardiac stent placed. He used a cane but was short of breath when walking to the mailbox and back. He followed up with Ihor Austin in December 2020, she is associated with our stroke service.  He was readmitted as a new stroke on 16 March 2020 he presented with severe dysarthria profound right-sided weakness, as a code stroke on the 26 of 2021 head CT was obtained and complaint compared to the previous 12 it showed no acute infarct at  first.  There was a positive finding of a progressive deep watershed ischemia and development of left-sided wallerian degeneration since July of the previous year 2020.  Chronic multifocal vessel occlusion and small vessel disease as well.  The CT redemonstrated findings from previously a brain MRI was ordered to make sure that there was no acute change.   The MRI showed no acute infarct but deep watershed ischemia this was a progressive finding the extent of watershed ischemia in comparison to July 2020, small vessel disease.  The patient also had mild aphasia, he did not have headaches, no longer has diplopia his dysarthria has made it difficult for him to speak but he has no problems comprehending speech.  This time his right leg is the most weak. All pointing to the left middle cerebral artery.     Hospital Problem List    Essential hypertension   History of stroke   Benign hypertensive heart and kidney disease with diastolic CHF, NYHA class II and CKD stage III (HCC)   Morbid obesity (HCC)   CVA (cerebral vascular accident) (HCC)   Acute renal failure (HCC)   History of CVA in adulthood   Dyslipidemia         Sleep habits are as follows: he goes to bed around midnight. The bedroom is cool, quiet and dark. He sleeps on 2  pillows. On a flat bed.  He wakes within 3-4 hours to go to bathroom. He dreams of needing to go to the bathroom. He can't go back to sleep many nights- " that's it". Other nights he will be sleeping another 2-3 hours-Sometimes he gasps or snores . Wakes up at 6.30 final rises and helps with breakfast. His stepchildren get ready for school. He may go back to bed, but doesn't sleep. He may nap after lunch. He falls asleep easily.    1. CAD- NSTEMI 04/2018, cath showed occluded LCX. Received DES. LVgram 35-45%- 04/2018 echo LVEF 50-55%, no WMAs, grade I diastolic dysfunction - ASA allergy, had been on brillinta alone. We tried stopping coreg due to fatigue and  erectile dysfunction but no improvement in symptoms. No ACE/ARB/ARNI/aldactone due to poor renal function - since last visit had CVA, he was changed from effient to Plavix as there is contraindication for effient and CVA - during 08/2018 admission with CVA echo showed LVEF back down to 35-40%.  - no recent chest pain. Genralized fatigue since stroke.  2. HTN- compliant with meds 3. Hyperlipidemia- compliant with statin.  4. CKD 3 5. CVA - admit 08/2018 with left cerebellar stroke.  - no arrhtyhmia detected, TEE benign. 30 day monitor was asked to be arranged today -ongoing left sided weakness.  6. MRI water shed - 03-11-2020-Left MCA stroke with dysarthria and right leg weakness- wheelchair bound.           Review of Systems: Out of a complete 14 system review, the patient complains of only the following symptoms, and all other reviewed systems are negative.:    Social History   Socioeconomic History  . Marital status: Single    Spouse name: Not on file  . Number of children: Not on file  . Years of education: Not on file  . Highest education level: Not on file  Occupational History  . Not on file  Tobacco Use  . Smoking status: Former Smoker    Types: Cigarettes  . Smokeless tobacco: Never Used  . Tobacco comment: smoked for 10-20 years at 1/2 ppd as of 2019  Substance and Sexual Activity  . Alcohol use: Not Currently  . Drug use: Not Currently  . Sexual activity: Yes  Other Topics Concern  . 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 Lives with girlfriend.   Social Determinants of Health   Financial Resource Strain:   . Difficulty of Paying Living Expenses:   Food Insecurity:   . Worried About Programme researcher, broadcasting/film/video in the Last Year:   . Barista in the Last Year:   Transportation Needs:   . Freight forwarder (Medical):   [name] Lack of Transportation  (Non-Medical):   Physical Activity:   . Days of Exercise per Week:   . Minutes of Exercise per Session:   Stress:   . Feeling of Stress :   Social Connections:   . Frequency of Communication with Friends and Family:   . Frequency of Social Gatherings with Friends and Family:   . Attends Religious Services:   . Active Member of Clubs or Organizations:   . Attends Banker Meetings:   [name] Marital Status:     Family History  Problem Relation Age of Onset  . Hypertension Mother   . Stroke Mother   . Dementia Mother   . Hypertension Father   .  Stroke Father   . Cancer Father   . Alcoholism Father   . Cancer Sister     Past Medical History:  Diagnosis Date  . CAD (coronary artery disease)    a. s/p NSTEMI in 04/2018 with DES to LCx.   [name] Hypertension   . Myocardial infarction (HCC)   . Pneumonia   . Stroke [facility])     Past Surgical History:  Procedure Laterality Date  . CORONARY STENT INTERVENTION N/A 05/05/2018   Procedure: CORONARY STENT INTERVENTION;  Surgeon: Marykay Lex, MD;  Location: [facility] INVASIVE CV LAB;  Service: Cardiovascular;  Laterality: N/A;  . LEFT HEART CATH AND CORONARY ANGIOGRAPHY N/A 05/05/2018   Procedure: LEFT HEART CATH AND CORONARY ANGIOGRAPHY;  Surgeon: Marykay Lex, MD;  Location: [facility] INVASIVE CV LAB;  Service: Cardiovascular;  Laterality: N/A;  . TEE WITHOUT CARDIOVERSION N/A 09/08/2018   Procedure: TRANSESOPHAGEAL ECHOCARDIOGRAM (TEE);  Surgeon: Laurey Morale, MD;  Location: [facility] ENDOSCOPY;  Service: Cardiovascular;  Laterality: N/A;     Current Outpatient Medications on File Prior to Visit  Medication Sig Dispense Refill  . acetaminophen (TYLENOL) 325 MG tablet Take 2 tablets (650 mg total) by mouth every 6 (six) hours as needed for mild pain (or Fever >/= 101).    [name] amLODipine (NORVASC) 10 MG tablet Take 1 tablet (10 mg total) by mouth daily. 90 tablet 0  . atorvastatin (LIPITOR) 40 MG tablet TAKE 1 TABLET BY MOUTH ONCE DAILY AT   6:00  PM 90 tablet 0  . carvedilol (COREG) 6.25 MG tablet Take 1 tablet (6.25 mg total) by mouth 2 (two) times daily with a meal. 180 tablet 0  . clopidogrel (PLAVIX) 75 MG tablet Take 1 tablet (75 mg total) by mouth daily. 90 tablet 3  . hydrALAZINE (APRESOLINE) 25 MG tablet Take 1 tablet (25 mg total) by mouth 3 (three) times daily. 90 tablet 3  . Melatonin 10 MG TABS Take 10 mg by mouth at bedtime. 30 tablet 0  . mirtazapine (REMERON) 7.5 MG tablet Take 1 tablet (7.5 mg total) by mouth at bedtime. 30 tablet 0  . senna-docusate (SENOKOT-S) 8.6-50 MG tablet Take 2 tablets by mouth at bedtime.    . traMADol (ULTRAM) 50 MG tablet Take 1 tablet (50 mg total) by mouth every 8 (eight) hours as needed for severe pain. 20 tablet 0  . [DISCONTINUED] amLODipine (NORVASC) 10 MG tablet Take 1 tablet (10 mg total) by mouth daily. 30 tablet 0  . [DISCONTINUED] atorvastatin (LIPITOR) 40 MG tablet Take 1 tablet (40 mg total) by mouth daily at 6 PM. 30 tablet 0  . [DISCONTINUED] carvedilol (COREG) 6.25 MG tablet Take 1 tablet (6.25 mg total) by mouth 2 (two) times daily with a meal. 60 tablet 0   No current facility-administered medications on file prior to visit.    Allergies  Allergen Reactions  . Aspirin Swelling    Physical exam:  Today's Vitals   05/11/20 1059  BP: 126/70  Pulse: 98  SpO2: 97%   There is no height or weight on file to calculate BMI.   Wt Readings from Last 3 Encounters:  04/07/20 (!) 321 lb 12.8 oz (146 kg)  03/31/20 (!) 321 lb 14 oz (146 kg)  03/11/20 (!) 336 lb 3.2 oz (152.5 kg)     Ht Readings from Last 3 Encounters:  04/07/20 6' (1.829 m)  03/19/20 6' (1.829 m)  11/26/19 6' (1.829 m)      General: The patient is  awake, alert and appears not in acute distress. The patient is well groomed. Head: Normocephalic, atraumatic. Neck is supple. Mallampati ,  Cardiovascular:  Regular rate and cardiac rhythm by pulse,  without distended neck veins. Respiratory: Lungs are  clear to auscultation.  Skin:  Without evidence of ankle edema, or rash. Trunk: The patient's posture is erect.   Neurologic exam : The patient is awake and alert, oriented to place and time.   Memory subjective described as intact.  Attention span & concentration ability appears normal.  Speech is fluent,  with severe dysarthria, dysphonia , not so much  aphasia.  Mood and affect are appropriate.   Cranial nerves: no loss of smell or taste reported  Pupils are equal and briskly reactive to light. Funduscopic exam deferred.   Extraocular movements in vertical and horizontal planes were intact but there was a fast saccade jumping over the nasal field- blind spot ?  No Diplopia reported this time . Visual fields by finger perimetry are intact. Hearing was intact to soft voice and finger rubbing.    Facial sensation intact to fine touch.  Facial motor strength is symmetric and tongue and uvula move midline.  Neck ROM : rotation, tilt and flexion extension were normal for age and shoulder shrug was weak on the right    Motor exam:   right hemiparesis with increasing tone, loss of strength in elbow extension , wrist extension and finger extension  Patient. resumes a right elbow flexion ad wrist flexion with fisted hand.   Sensory:  Fine touch, pinprick and vibration were tested  and  Reportedly normal. There is evidence of right sided extinction on arm and leg when simultaneously touches.    DTR were surprisingly attenuated bilaterally.    Ref Range & Units 1 mo ago  (03/18/20) 1 mo ago  (03/16/20) 1 mo ago  (03/13/20)  Sodium 135 - 145 mmol/L 141   139   Potassium 3.5 - 5.1 mmol/L 4.2   4.0   Chloride 98 - 111 mmol/L 107   107   CO2 22 - 32 mmol/L 22   23   Glucose, Bld 70 - 99 mg/dL 99   101High  CM   Comment: Glucose reference range applies only to samples taken after fasting for at least 8 hours.  BUN 6 - 20 mg/dL 27High    14   Creatinine, Ser 0.61 - 1.24 mg/dL 1.67High   2.11High    1.47High    Calcium 8.9 - 10.3 mg/dL 9.5   9.2   Total Protein 6.5 - 8.1 g/dL 8.0     Albumin 3.5 - 5.0 g/dL 3.3Low      AST 15 - 41 U/L 13Low      ALT 0 - 44 U/L 14     Alkaline Phosphatase 38 - 126 U/L 87     Total Bilirubin 0.3 - 1.2 mg/dL 0.6     GFR calc non Af Amer >60 mL/min 47Low   36Low   55Low    GFR calc Af Amer >60 mL/min 55Low   41Low  CM  >60   Anion gap 5 - 15 12        Ref Range & Units 1 mo ago  (03/29/20) 1 mo ago  (03/22/20) 1 mo ago  (03/18/20)  Sodium 135 - 145 mmol/L 137  136  141   Potassium 3.5 - 5.1 mmol/L 4.1  4.5  4.2   Chloride 98 - 111 mmol/L 103  104  107   CO2 22 - 32 mmol/L 21Low   22  22   Glucose, Bld 70 - 99 mg/dL 403TCYE   94 CM  99 CM   Comment: Glucose reference range applies only to samples taken after fasting for at least 8 hours.  BUN 6 - 20 mg/dL 17  18HTMB   31PETK    Creatinine, Ser 0.61 - 1.24 mg/dL 1.58High   1.59High   1.67High    Calcium 8.9 - 10.3 mg/dL 9.4  9.4  9.5   GFR calc non Af Amer >60 mL/min 51Low   50Low   47Low    GFR calc Af Amer >60 mL/min 59Low   58Low   55Low            (important suggestion)  Newer results are available. Click to view them now.   Ref Range & Units 1 mo ago  (03/16/20) 1 mo ago  (03/13/20) 2 mo ago  (03/12/20)  Creatinine, Ser 0.61 - 1.24 mg/dL 2.11High   1.47High   1.60High    GFR calc non Af Amer >60 mL/min 36Low   55Low   50Low    GFR calc Af Amer >60 mL/min 41Low   >60  58Low    Comment: Performed at [facility], [address], [place], [place] [address]  Resulting Agency  [facility]            After spending a total time of 40  minutes face to face and additional time for physical and neurologic examination, review of laboratory studies,  personal review of imaging studies, reports and results of other testing and review of referral information / records as far as provided in visit, I have established the following assessments:  1) non embolic  new  watershed infarction in a predamaged arterial area of the left MCA.  2) multivessel arteriosclerosis/ atherosclerosis.  3) still in rehab -    My Plan is to proceed with: PCP and STROKE team cobined assessment of need for Kidney care, hyperlipidemia and HTN.   1) HTN control systolic allowed for 140 mmhg.   Must not be too tight as his stenoitic vessels need flow.  2) Lipitor.  3) Plavix to continue.  OT, PT and ST to continue.   I would like to thank [name], NP and Ihor Austin  for allowing me to meet with and to take care of this pleasant patient.   In short, Zachary Mccann is presenting with a new , wider area of left MCA watershed infarction-   I plan to follow up  through our NP within 2-3  month.   CC: I will share my notes with PCP.  Electronically signed by: Melvyn Novas, MD 05/11/2020 11:14 AM  [facility] Board certified by The [employer] of Sleep Medicine and Diplomate of the [employer] of Sleep Medicine. Board certified In Neurology through the ABPN, Fellow of the [employer] of Neurology. Medical Director of [facility].

## 2020-05-12 ENCOUNTER — Ambulatory Visit: Payer: Medicaid Other | Admitting: Physical Therapy

## 2020-05-18 ENCOUNTER — Other Ambulatory Visit: Payer: Self-pay

## 2020-05-18 ENCOUNTER — Ambulatory Visit: Payer: Medicaid Other | Admitting: Occupational Therapy

## 2020-05-18 ENCOUNTER — Ambulatory Visit: Payer: Medicaid Other | Attending: Nurse Practitioner

## 2020-05-18 ENCOUNTER — Ambulatory Visit: Payer: Medicaid Other

## 2020-05-18 DIAGNOSIS — M6281 Muscle weakness (generalized): Secondary | ICD-10-CM | POA: Diagnosis not present

## 2020-05-18 DIAGNOSIS — R482 Apraxia: Secondary | ICD-10-CM | POA: Diagnosis present

## 2020-05-18 DIAGNOSIS — R2689 Other abnormalities of gait and mobility: Secondary | ICD-10-CM | POA: Insufficient documentation

## 2020-05-18 DIAGNOSIS — R278 Other lack of coordination: Secondary | ICD-10-CM | POA: Insufficient documentation

## 2020-05-18 DIAGNOSIS — R2681 Unsteadiness on feet: Secondary | ICD-10-CM | POA: Insufficient documentation

## 2020-05-18 DIAGNOSIS — I69353 Hemiplegia and hemiparesis following cerebral infarction affecting right non-dominant side: Secondary | ICD-10-CM

## 2020-05-18 DIAGNOSIS — R471 Dysarthria and anarthria: Secondary | ICD-10-CM | POA: Diagnosis present

## 2020-05-18 NOTE — Therapy (Signed)
[facility] [address] [place], [place], [address] Phone: [phone]   Fax:  [phone]  Occupational Therapy Treatment  Patient Details  Name: Zachary Mccann MRN: [id number] Date of Birth: 1971-06-29 No data recorded  Encounter Date: 05/18/2020  OT End of Session - 05/18/20 1212    Visit Number  4    Number of Visits  11    Date for OT Re-Evaluation  06/28/20    Authorization Type  MCD    Authorization Time Period  Approved 10 visits from 5/13 - 07/06/20    Authorization - Visit Number  3    Authorization - Number of Visits  10    OT Start Time  1100    OT Stop Time  1145    OT Time Calculation (min)  45 min    Activity Tolerance  Patient tolerated treatment well    Behavior During Therapy  [facility] for tasks assessed/performed       Past Medical History:  Diagnosis Date  . CAD (coronary artery disease)    a. s/p NSTEMI in 04/2018 with DES to LCx.   [name] Hypertension   . Myocardial infarction ([place])   . Pneumonia   . Stroke [facility])     Past Surgical History:  Procedure Laterality Date  . CORONARY STENT INTERVENTION N/A 05/05/2018   Procedure: CORONARY STENT INTERVENTION;  Surgeon: Leonie Man, MD;  Location: [facility] CV LAB;  Service: Cardiovascular;  Laterality: N/A;  . LEFT HEART CATH AND CORONARY ANGIOGRAPHY N/A 05/05/2018   Procedure: LEFT HEART CATH AND CORONARY ANGIOGRAPHY;  Surgeon: Leonie Man, MD;  Location: [facility] CV LAB;  Service: Cardiovascular;  Laterality: N/A;  . TEE WITHOUT CARDIOVERSION N/A 09/08/2018   Procedure: TRANSESOPHAGEAL ECHOCARDIOGRAM (TEE);  Surgeon: Larey Dresser, MD;  Location: [facility] ENDOSCOPY;  Service: Cardiovascular;  Laterality: N/A;    There were no vitals filed for this visit.  Subjective Assessment - 05/18/20 1117    Pertinent History  New CVA 03/11/20 w/ increased weakness on Rt side. Lt CVA w/ Rt hemiparesis July 2020, HTN, CAD, MI, s/p NSTEMI May 2019.     Limitations  max assist of 2 to stand and transfer, fall risk, aphasic/apraxic    Currently in Pain?  Yes   Rt LE - O.T. not addressing      Practiced UE dressing with cues initially to use hemi-techniques  - pt could doff I'ly after practice x 2, and don with min assist to help feed Rt arm through, however pt could assist some with RUE. Pt shown use of shoe buttons and pt demo doffing Lt shoe and sock w/ mod cueing and use of stool and shoe button. Pt then donning Lt shoe and sock w/ min assist to don sock over toes - anticipate pt could do I'ly on Lt side w/ practice and use of stool and shoe buttons. Pt given handout and told where to purchase shoe buttons.  Pt able to transfer to level surface mat using scoot technique w/ mod assist x 1 (however sit to stand and stand pivot requires assist x 2). Therapist w/ assist from rehab tech reapplied tire on Lt wheel of w/c and told what number to call for repair or replacement of w/c for leg rests and tires.                       OT Short Term Goals - 05/18/20 1213  OT SHORT TERM GOAL #1   Title  Independent with HEP    Baseline  dependent, not yet issued    Time  5    Period  Weeks    Status  Achieved      OT SHORT TERM GOAL #2   Title  Pt to perform 50* or greater shoulder flexion RUE for low level reaching/assist w/ ADLS    Baseline  30*    Time  5    Period  Weeks    Status  On-going      OT SHORT TERM GOAL #3   Title  Pt to perform UE dressing I'ly    Baseline  mod assist    Time  5    Period  Weeks    Status  On-going   Pt with min assist to don, able to doff I'ly     OT SHORT TERM GOAL #4   Title  Pt to perform transfer to [facility] with max assist x 1    Baseline  total dependence    Time  5    Period  Weeks    Status  On-going      OT SHORT TERM GOAL #5   Title  Pt to improve RUE function as evidenced by performing 15 blocks on Box & Blocks test    Baseline  Rt = 9    Time  5    Period  Weeks     Status  On-going        OT Long Term Goals - 04/19/20 1459      OT LONG TERM GOAL #1   Title  Pt to perform LE bathing and dressing with mod assist (max assist to stand to pull up over hips) with A/E prn    Baseline  max assist to total dependence    Time  10    Period  Weeks    Status  New      OT LONG TERM GOAL #2   Title  Pt to perform level transfers with mod assist    Baseline  max assist x 2, or total dependence    Time  10    Period  Weeks    Status  New      OT LONG TERM GOAL #3   Title  Pt to perform RUE shoulder flexion to 70* or greater for consistent low level reaching    Baseline  30*    Time  10    Period  Weeks    Status  New      OT LONG TERM GOAL #4   Title  Grip strength Rt hand to be 20 lbs or greater to assist with opening containers    Baseline  10 lbs    Time  10    Period  Weeks    Status  New      OT LONG TERM GOAL #5   Title  Pt to make simple snack from w/c level, standing at counter to retrieve items from higher shelves    Baseline  dependent - pt only getting snack from w/c level    Time  10    Period  Weeks    Status  New            Plan - 05/18/20 1214    Clinical Impression Statement  Pt progressing towards UE ADLS. Pt also w/ greater independence w/ scoot transfers vs. stand pivot transfers    Occupational performance deficits (  Please refer to evaluation for details):  ADL's;IADL's;Social Participation    Body Structure / Function / Physical Skills  ADL;ROM;IADL;Body mechanics;Endurance;Sensation;Mobility;Flexibility;Strength;Coordination;FMC;Obesity;UE functional use;Decreased knowledge of precautions;Decreased knowledge of use of DME    OT Frequency  1x / week    OT Duration  --   10 weeks   OT Treatment/Interventions  Self-care/ADL training;Therapeutic exercise;Functional Mobility Training;Neuromuscular education;Splinting;Manual Therapy;Therapeutic activities;Coping strategies training;DME and/or AE instruction;Cognitive  remediation/compensation;Visual/perceptual remediation/compensation;Passive range of motion;Patient/family education    Plan  continue to work towards STG's (pt has limited visits) - reinforce ADL strategies, RUE use, functional transfers (pt can scoot pivot to level transfers w/ mod assist x 1, but stand pivot requires x 2)    Consulted and Agree with Plan of Care  Patient       Patient will benefit from skilled therapeutic intervention in order to improve the following deficits and impairments:   Body Structure / Function / Physical Skills: ADL, ROM, IADL, Body mechanics, Endurance, Sensation, Mobility, Flexibility, Strength, Coordination, FMC, Obesity, UE functional use, Decreased knowledge of precautions, Decreased knowledge of use of DME       Visit Diagnosis: Hemiplegia and hemiparesis following cerebral infarction affecting right non-dominant side [facility])    Problem List Patient Active Problem List   Diagnosis Date Noted  . Acute ischemic left middle cerebral artery (MCA) stroke (HCC) 05/11/2020  . Cerebral infarction, watershed distribution, unilateral, acute (HCC) 05/11/2020  . Dysarthria as late effect of cerebrovascular accident (CVA) 05/11/2020  . Visual field constriction, bilateral 05/11/2020  . Hemiparesis affecting right side as late effect of cerebrovascular accident (HCC) 05/11/2020  . Dysarthria, post-stroke 04/07/2020  . 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  . Hyperglycemia   . Anemia   . Acute pain of right knee   . Primary osteoarthritis of right knee   . Expressive language impairment   . Benign essential HTN   . Acute bilat watershed infarction [facility]) 03/16/2020  . Cerebral infarction, watershed distribution, unilateral, chronic 03/16/2020  . Acute renal failure (HCC)   . History of CVA in adulthood   . Dyslipidemia   . CVA (cerebral vascular accident) (HCC) 03/11/2020  . Leukocytosis   . Hemiparesis affecting dominant side as late effect of stroke (HCC)    . Labile blood pressure   . Sleep disturbance   . Benign hypertensive heart and kidney disease with diastolic CHF, NYHA class II and CKD stage III (HCC)   . Chronic systolic congestive heart failure (HCC)   . Morbid obesity (HCC)   . Uncontrolled hypertension   . Dysphagia, post-stroke   . Cardiomegaly   . Acute systolic congestive heart failure (HCC)   . 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 Cerebral embolism with cerebral infarction 07/11/2019  . Acute CVA (cerebrovascular accident) (HCC) 07/11/2019  . Slurred speech 07/10/2019  . Cardiomyopathy (HCC) 11/20/2018  . History of stroke 09/06/2018  . Tobacco use 09/06/2018  . History of non-ST elevation myocardial infarction (NSTEMI) 05/05/2018  . Severe Vitamin D deficiency 04/02/2018  . Community acquired pneumonia of left lower lobe of lung 04/02/2018  . CKD (chronic kidney disease), stage III (HCC) = Secondary to Hypertension 04/02/2018  . Pneumonia 03/30/2018  . Metabolic acidosis 03/30/2018  . AKI (acute kidney injury) (HCC) 03/30/2018  . N&V (nausea and vomiting) 03/30/2018  . Essential hypertension 03/30/2018    Kelli Churn, OTR/L 05/18/2020, 12:26 PM  [facility] [address] [place], [place], [address] Phone: [phone]   Fax:  [phone]  Name: MUBASHIR MALLEK MRN: [id number] Date of Birth: [birth year]

## 2020-05-18 NOTE — Therapy (Signed)
[facility] [address] [place], [place], [address] Phone: [phone]   Fax:  [phone]  Physical Therapy Treatment  Patient Details  Name: Zachary Mccann MRN: [id number] Date of Birth: November 14, 1971 Referring Provider (PT): Referred by Mariam Dollar but neurologist is Maryla Morrow   Encounter Date: 05/18/2020  PT End of Session - 05/18/20 1250    Visit Number  5    Number of Visits  11    Date for PT Re-Evaluation  06/07/20    Authorization Type  Medicaid 4/28-5/18 3 visits, 8 Visits 5/20 -  7/14    Authorization - Visit Number  2    Authorization - Number of Visits  8    PT Start Time  1145    PT Stop Time  1229    PT Time Calculation (min)  44 min    Equipment Utilized During Treatment  Gait belt    Activity Tolerance  Patient tolerated treatment well    Behavior During Therapy  [facility] for tasks assessed/performed       Past Medical History:  Diagnosis Date  . CAD (coronary artery disease)    a. s/p NSTEMI in 04/2018 with DES to LCx.   [name] Hypertension   . Myocardial infarction (HCC)   . Pneumonia   . Stroke [facility])     Past Surgical History:  Procedure Laterality Date  . CORONARY STENT INTERVENTION N/A 05/05/2018   Procedure: CORONARY STENT INTERVENTION;  Surgeon: Marykay Lex, MD;  [facility] INVASIVE CV LAB;  Service: Cardiovascular;  Laterality: N/A;  . LEFT HEART CATH AND CORONARY ANGIOGRAPHY N/A 05/05/2018   Procedure: LEFT HEART CATH AND CORONARY ANGIOGRAPHY;  Surgeon: Marykay Lex, MD;  Location: [facility] INVASIVE CV LAB;  Service: Cardiovascular;  Laterality: N/A;  . TEE WITHOUT CARDIOVERSION N/A 09/08/2018   Procedure: TRANSESOPHAGEAL ECHOCARDIOGRAM (TEE);  Surgeon: Laurey Morale, MD;  Location: [facility] ENDOSCOPY;  Service: Cardiovascular;  Laterality: N/A;    There were no vitals filed for this visit.  Subjective Assessment - 05/18/20 1150    Subjective  Patient reports swelling in BLE has  improved. Patient reports pain in RLE. No other new complaints.    Pain Score  7     Pain Location  Leg    Pain Orientation  Right    Pain Descriptors / Indicators  Aching    Pain Onset  Today   reports started this morning   Pain Frequency  Intermittent                        [facility] Adult PT Treatment/Exercise - 05/18/20 0001      Bed Mobility   Bed Mobility  Rolling Left;Supine to Sit;Sit to Supine    Rolling Left  Supervision/Verbal cueing    Right Sidelying to Sit  Moderate Assistance - Patient 50-74%   assistance at trunk, verbal cues for hand placement   Sit to Supine  Moderate Assistance - Patient 50-74%   Mod A for BLE     Transfers   Transfers  Sit to Stand;Stand to Sit    Sit to Stand  3: Mod assist;2: Max assist    Sit to Stand Details  Tactile cues for weight shifting;Tactile cues for posture;Verbal cues for technique;Verbal cues for sequencing;Manual facilitation for weight shifting    Sit to Stand Details (indicate cue type and reason)  Completed 2 x 4 reps of sit <> stands from elevated mat, with  intermittent Mod - Max A with bariatric RW.     Stand to Sit  4: Min assist    Stand to Sit Details (indicate cue type and reason)  Verbal cues for technique    Stand to Sit Details  verbal cues for controlled descent. also to reach back with LUE to ensure chair/surface is behind patient.       Ambulation/Gait   Ambulation/Gait  Yes    Ambulation/Gait Assistance  3: Mod assist;4: Min assist    Ambulation/Gait Assistance Details  verbal cues for step length and foot placement, PT providing min blocking at R knee. w/c follow for safety.     Ambulation Distance (Feet)  69 Feet    Assistive device  Rolling walker   bariatric   Gait Pattern  Step-through pattern;Decreased hip/knee flexion - right;Decreased weight shift to right;Decreased stance time - left    Ambulation Surface  Level;Indoor      Self-Care   Self-Care  Other Self-Care Comments    Other  Self-Care Comments   Educated and provided patient with phone number to allow wife to call about w/c. Current w/c is broken. PT tech was able to put wheel back on w/c in today's session, but continue to have difficultly rolling and with footrests.       Exercises   Exercises  Knee/Hip      Knee/Hip Exercises: Supine   Heel Slides  Right;AROM;Strengthening;2 sets;10 reps    Heel Slides Limitations  completed with towel under RLE, focus on control with extension and active flexion. PT providing assistance to keep RLE in neutral alignment with completion.     Other Supine Knee/Hip Exercises  Completed isometric hip abduction into belt around thighs 1 x 10 reps with 3-5 sec hold. verbal cues for technique. Completed bent knee fall outs with RLE, focus on controlled abduction and active adduction back to midline. PT providing stabilization at LE to keep in proper alignment during completion.               PT Education - 05/18/20 1249    Education Details  Educated on phone number to [facility] to have wife call about w/c.    Person(s) Educated  Patient    Methods  Explanation;Handout   provided written note with phone number   Comprehension  Verbalized understanding       PT Short Term Goals - 05/10/20 1732      PT SHORT TERM GOAL #1   Title  Pt will be independent with initial HEP for strengthening, flexibility to continue with wife's assistance.    Baseline  HEP initiated    Time  4    Period  Weeks    Status  On-going    Target Date  05/08/20      PT SHORT TERM GOAL #2   Title  Pt will be CGA with bed mobility for improved function in home.    Baseline  currently still requiring Mod A with bed mobility, CGA for rolling R/L    Time  4    Period  Weeks    Status  On-going    Target Date  05/08/20      PT SHORT TERM GOAL #3   Title  Pt will be able to perform sit to stand from wheelchair mod assist for improved function/functional strength at home.    Baseline  max  assist +2    Time  4    Period  Weeks  Status  On-going    Target Date  05/08/20      PT SHORT TERM GOAL #4   Title  Pt will ambulate 22' with RW min assist for improved household mobility.    Baseline  ambualte 3' with with Min A to Mod A with RW    Time  4    Period  Weeks    Status  On-going    Target Date  05/08/20        PT Long Term Goals - 04/08/20 1423      PT LONG TERM GOAL #1   Title  Pt will be to perform progressive HEP with wife assist to continue gains on own.    Baseline  no current HEP    Time  10    Period  Weeks    Status  New    Target Date  06/17/20      PT LONG TERM GOAL #2   Title  Pt will be able to perform transfers CGA for improved functional strength and mobility.    Baseline  max assist +2    Time  10    Period  Weeks    Status  New    Target Date  06/17/20      PT LONG TERM GOAL #3   Title  Pt will be able to ambulate >100' CGA with RW for further improvement in household mobility.    Baseline  2' min assist to transfer mat to w/c with RW    Time  10    Period  Weeks    Status  New    Target Date  06/17/20      PT LONG TERM GOAL #4   Title  Pt will be able to maintain standing x 5 min with supervision with light UE support on walker or counter for improved balance and participation with ADLs.    Baseline  CGA/Min assist to stand at [facility] briefly    Time  10    Period  Weeks    Status  New    Target Date  06/17/20            Plan - 05/18/20 1250    Clinical Impression Statement  Today's skilled PT session included gait training, patient able to ambulated 69 feet today with bariatric RW and Mod A. Completed transfer training, including sit <> stands from elevated surface, with elevated surface pt require less assistance for completion. Continued therex targeting RLE strength and ROM. pt wil continue to benefit from skilled PT services to address deficits and progress toward all unmet goals.    Personal Factors and Comorbidities   Comorbidity 3+    Comorbidities  hypertension, tobacco abuse, CAD with non-STEMI 04/2018 as well as left ICA occlusion July 2020    Examination-Activity Limitations  Bathing;Hygiene/Grooming;Stairs;Bed Mobility;Locomotion Level;Stand;Toileting;Transfers    Examination-Participation Restrictions  Community Activity    Stability/Clinical Decision Making  Evolving/Moderate complexity    Rehab Potential  Good    PT Frequency  1x / week    PT Duration  --   10 weeks plus eval   PT Treatment/Interventions  ADLs/Self Care Home Management;Moist Heat;Electrical Stimulation;Gait training;DME Instruction;Functional mobility training;Stair training;Neuromuscular re-education;Balance training;Therapeutic exercise;Therapeutic activities;Patient/family education;Orthotic Fit/Training;Vestibular;Passive range of motion;Manual techniques;Wheelchair mobility training    PT Next Visit Plan  Update on w/c? continue gait, sit <> stand training, seated/supine strengthening    Consulted and Agree with Plan of Care  Patient       Patient  will benefit from skilled therapeutic intervention in order to improve the following deficits and impairments:  Abnormal gait, Decreased activity tolerance, Decreased balance, Decreased endurance, Decreased mobility, Decreased knowledge of use of DME, Decreased range of motion, Decreased strength, Impaired UE functional use, Pain  Visit Diagnosis: Muscle weakness (generalized)  Other abnormalities of gait and mobility  Unsteadiness on feet     Problem List Patient Active Problem List   Diagnosis Date Noted  . Acute ischemic left middle cerebral artery (MCA) stroke ([place]) 05/11/2020  . Cerebral infarction, watershed distribution, unilateral, acute ([place]) 05/11/2020  . Dysarthria as late effect of cerebrovascular accident (CVA) 05/11/2020  . Visual field constriction, bilateral 05/11/2020  . Hemiparesis affecting right side as late effect of cerebrovascular accident ([place])  05/11/2020  . Dysarthria, post-stroke 04/07/2020  . 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  . Hyperglycemia   . Anemia   . Acute pain of right knee   . Primary osteoarthritis of right knee   . Expressive language impairment   . Benign essential HTN   . Acute bilat watershed infarction [facility]) 03/16/2020  . Cerebral infarction, watershed distribution, unilateral, chronic 03/16/2020  . Acute renal failure ([place])   . History of CVA in adulthood   . Dyslipidemia   . CVA (cerebral vascular accident) ([place]) 03/11/2020  . Leukocytosis   . Hemiparesis affecting dominant side as late effect of stroke ([place])   . Labile blood pressure   . Sleep disturbance   . Benign hypertensive heart and kidney disease with diastolic CHF, NYHA class II and CKD stage III ([place])   . Chronic systolic congestive heart failure ([place])   . Morbid obesity ([place])   . Uncontrolled hypertension   . Dysphagia, post-stroke   . Cardiomegaly   . Acute systolic congestive heart failure ([place])   . Left middle cerebral artery stroke ([place]) 07/17/2019  . Cerebral embolism with cerebral infarction 07/11/2019  . Acute CVA (cerebrovascular accident) ([place]) 07/11/2019  . Slurred speech 07/10/2019  . Cardiomyopathy ([place]) 11/20/2018  . History of stroke 09/06/2018  . Tobacco use 09/06/2018  . History of non-ST elevation myocardial infarction (NSTEMI) 05/05/2018  . Severe Vitamin D deficiency 04/02/2018  . Community acquired pneumonia of left lower lobe of lung 04/02/2018  . CKD (chronic kidney disease), stage III ([place]) = Secondary to Hypertension 04/02/2018  . Pneumonia 03/30/2018  . Metabolic acidosis 13/07/6577  . AKI (acute kidney injury) ([place]) 03/30/2018  . N&V (nausea and vomiting) 03/30/2018  . Essential hypertension 03/30/2018    Jones Bales, PT, DPT 05/18/2020, 12:55 PM  [facility] [address] [place], [place], [address] Phone: [phone]   Fax:   [phone]  Name: PHU RECORD MRN: [id number] Date of Birth: November 19, 1971

## 2020-05-19 ENCOUNTER — Encounter: Payer: Medicaid Other | Attending: Physical Medicine & Rehabilitation | Admitting: Physical Medicine & Rehabilitation

## 2020-05-19 DIAGNOSIS — G479 Sleep disorder, unspecified: Secondary | ICD-10-CM | POA: Insufficient documentation

## 2020-05-19 DIAGNOSIS — Z8673 Personal history of transient ischemic attack (TIA), and cerebral infarction without residual deficits: Secondary | ICD-10-CM | POA: Insufficient documentation

## 2020-05-19 DIAGNOSIS — F801 Expressive language disorder: Secondary | ICD-10-CM | POA: Insufficient documentation

## 2020-05-19 DIAGNOSIS — I69322 Dysarthria following cerebral infarction: Secondary | ICD-10-CM | POA: Insufficient documentation

## 2020-05-19 DIAGNOSIS — I10 Essential (primary) hypertension: Secondary | ICD-10-CM | POA: Insufficient documentation

## 2020-05-19 DIAGNOSIS — I63232 Cerebral infarction due to unspecified occlusion or stenosis of left carotid arteries: Secondary | ICD-10-CM | POA: Insufficient documentation

## 2020-05-19 DIAGNOSIS — R269 Unspecified abnormalities of gait and mobility: Secondary | ICD-10-CM | POA: Insufficient documentation

## 2020-05-24 ENCOUNTER — Encounter: Payer: Medicaid Other | Admitting: Speech Pathology

## 2020-05-24 ENCOUNTER — Encounter: Payer: Medicaid Other | Admitting: Occupational Therapy

## 2020-05-24 ENCOUNTER — Ambulatory Visit: Payer: Medicaid Other

## 2020-05-25 ENCOUNTER — Ambulatory Visit: Payer: Medicaid Other

## 2020-05-25 ENCOUNTER — Other Ambulatory Visit: Payer: Self-pay

## 2020-05-25 ENCOUNTER — Encounter: Payer: Self-pay | Admitting: Occupational Therapy

## 2020-05-25 ENCOUNTER — Ambulatory Visit: Payer: Medicaid Other | Admitting: Occupational Therapy

## 2020-05-25 DIAGNOSIS — R2689 Other abnormalities of gait and mobility: Secondary | ICD-10-CM

## 2020-05-25 DIAGNOSIS — R278 Other lack of coordination: Secondary | ICD-10-CM

## 2020-05-25 DIAGNOSIS — R2681 Unsteadiness on feet: Secondary | ICD-10-CM

## 2020-05-25 DIAGNOSIS — R482 Apraxia: Secondary | ICD-10-CM

## 2020-05-25 DIAGNOSIS — I69353 Hemiplegia and hemiparesis following cerebral infarction affecting right non-dominant side: Secondary | ICD-10-CM

## 2020-05-25 DIAGNOSIS — M6281 Muscle weakness (generalized): Secondary | ICD-10-CM | POA: Diagnosis not present

## 2020-05-25 DIAGNOSIS — R471 Dysarthria and anarthria: Secondary | ICD-10-CM

## 2020-05-25 NOTE — Therapy (Signed)
[facility] [address] [place], [place], [address] Phone: [phone]   Fax:  [phone]  Occupational Therapy Treatment  Patient Details  Name: Zachary Mccann MRN: [id number] Date of Birth: 08/18/1971 No data recorded  Encounter Date: 05/25/2020  OT End of Session - 05/25/20 1021    Visit Number  5    Number of Visits  11    Date for OT Re-Evaluation  06/28/20    Authorization Type  MCD    Authorization Time Period  Approved 10 visits from 5/13 - 07/06/20    Authorization - Visit Number  4    Authorization - Number of Visits  10    OT Start Time  1020    OT Stop Time  1100    OT Time Calculation (min)  40 min    Activity Tolerance  Patient tolerated treatment well    Behavior During Therapy  [facility] for tasks assessed/performed       Past Medical History:  Diagnosis Date  . CAD (coronary artery disease)    a. s/p NSTEMI in 04/2018 with DES to LCx.   [name] Hypertension   . Myocardial infarction ([place])   . Pneumonia   . Stroke [facility])     Past Surgical History:  Procedure Laterality Date  . CORONARY STENT INTERVENTION N/A 05/05/2018   Procedure: CORONARY STENT INTERVENTION;  Surgeon: Leonie Man, MD;  Location: [place] CV LAB;  Service: Cardiovascular;  Laterality: N/A;  . LEFT HEART CATH AND CORONARY ANGIOGRAPHY N/A 05/05/2018   Procedure: LEFT HEART CATH AND CORONARY ANGIOGRAPHY;  Surgeon: Leonie Man, MD;  Location: [place] CV LAB;  Service: Cardiovascular;  Laterality: N/A;  . TEE WITHOUT CARDIOVERSION N/A 09/08/2018   Procedure: TRANSESOPHAGEAL ECHOCARDIOGRAM (TEE);  Surgeon: Larey Dresser, MD;  Location: [facility] ENDOSCOPY;  Service: Cardiovascular;  Laterality: N/A;    There were no vitals filed for this visit.  Subjective Assessment - 05/25/20 1020    Currently in Pain?  Yes    Pain Score  4     Pain Location  Knee    Pain Orientation  Right    Pain Descriptors / Indicators  Aching    Pain  Type  Acute pain    Pain Onset  Yesterday    Pain Frequency  Intermittent    Aggravating Factors   standing    Pain Relieving Factors  sitting             Treatment: Tableslides for shoulder flexion and circumduction, min v.c Seated at table, yellow putty exercises for RUE grip and pinch, min v.c and demo Flipping playing cards with RUE, min v.c for finger extension then picking up checkers to place in container min v.c Placing large to small pegs into semi circle with RUE min difficulty, min v.c to open hand                 OT Short Term Goals - 05/18/20 1213      OT SHORT TERM GOAL #1   Title  Independent with HEP    Baseline  dependent, not yet issued    Time  5    Period  Weeks    Status  Achieved      OT SHORT TERM GOAL #2   Title  Pt to perform 50* or greater shoulder flexion RUE for low level reaching/assist w/ ADLS    Baseline  30*    Time  5  Period  Weeks    Status  On-going      OT SHORT TERM GOAL #3   Title  Pt to perform UE dressing I'ly    Baseline  mod assist    Time  5    Period  Weeks    Status  On-going   Pt with min assist to don, able to doff I'ly     OT SHORT TERM GOAL #4   Title  Pt to perform transfer to [facility] with max assist x 1    Baseline  total dependence    Time  5    Period  Weeks    Status  On-going      OT SHORT TERM GOAL #5   Title  Pt to improve RUE function as evidenced by performing 15 blocks on Box & Blocks test    Baseline  Rt = 9    Time  5    Period  Weeks    Status  On-going        OT Long Term Goals - 05/25/20 1126      OT LONG TERM GOAL #1   Title  Pt to perform LE bathing and dressing with mod assist (max assist to stand to pull up over hips) with A/E prn    Baseline  max assist to total dependence    Time  10    Period  Weeks    Status  New      OT LONG TERM GOAL #2   Title  Pt to perform level transfers with mod assist    Baseline  max assist x 2, or total dependence    Time  10     Period  Weeks    Status  New      OT LONG TERM GOAL #3   Title  Pt to perform RUE shoulder flexion to 70* or greater for consistent low level reaching    Baseline  30*    Time  10    Period  Weeks    Status  New      OT LONG TERM GOAL #4   Title  Grip strength Rt hand to be 20 lbs or greater to assist with opening containers    Baseline  10 lbs    Time  10    Period  Weeks    Status  New      OT LONG TERM GOAL #5   Title  Pt to make simple snack from w/c level, standing at counter to retrieve items from higher shelves    Baseline  dependent - pt only getting snack from w/c level    Time  10    Period  Weeks    Status  New            Plan - 05/25/20 1057    Clinical Impression Statement  Pt is progressing towards goals. He demonstrates improving RUE functional use and coordination.    Occupational performance deficits (Please refer to evaluation for details):  ADL's;IADL's;Social Participation    Body Structure / Function / Physical Skills  ADL;ROM;IADL;Body mechanics;Endurance;Sensation;Mobility;Flexibility;Strength;Coordination;FMC;Obesity;UE functional use;Decreased knowledge of precautions;Decreased knowledge of use of DME    OT Frequency  1x / week    OT Duration  --   10 weeks   OT Treatment/Interventions  Self-care/ADL training;Therapeutic exercise;Functional Mobility Training;Neuromuscular education;Splinting;Manual Therapy;Therapeutic activities;Coping strategies training;DME and/or AE instruction;Cognitive remediation/compensation;Visual/perceptual remediation/compensation;Passive range of motion;Patient/family education    Plan  continue to work towards STG's (pt  has limited visits) - reinforce ADL strategies, RUE use, functional transfers -    Consulted and Agree with Plan of Care  Patient       Patient will benefit from skilled therapeutic intervention in order to improve the following deficits and impairments:   Body Structure / Function / Physical Skills:  ADL, ROM, IADL, Body mechanics, Endurance, Sensation, Mobility, Flexibility, Strength, Coordination, FMC, Obesity, UE functional use, Decreased knowledge of precautions, Decreased knowledge of use of DME       Visit Diagnosis: Muscle weakness (generalized)  Hemiplegia and hemiparesis following cerebral infarction affecting right non-dominant side (HCC)  Other lack of coordination    Problem List Patient Active Problem List   Diagnosis Date Noted  . Acute ischemic left middle cerebral artery (MCA) stroke (HCC) 05/11/2020  . Cerebral infarction, watershed distribution, unilateral, acute (HCC) 05/11/2020  . Dysarthria as late effect of cerebrovascular accident (CVA) 05/11/2020  . Visual field constriction, bilateral 05/11/2020  . Hemiparesis affecting right side as late effect of cerebrovascular accident (HCC) 05/11/2020  . Dysarthria, post-stroke 04/07/2020  . 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  . Hyperglycemia   . Anemia   . Acute pain of right knee   . Primary osteoarthritis of right knee   . Expressive language impairment   . Benign essential HTN   . Acute bilat watershed infarction [facility]) 03/16/2020  . Cerebral infarction, watershed distribution, unilateral, chronic 03/16/2020  . Acute renal failure (HCC)   . History of CVA in adulthood   . Dyslipidemia   . CVA (cerebral vascular accident) (HCC) 03/11/2020  . Leukocytosis   . Hemiparesis affecting dominant side as late effect of stroke (HCC)   . Labile blood pressure   . Sleep disturbance   . Benign hypertensive heart and kidney disease with diastolic CHF, NYHA class II and CKD stage III (HCC)   . Chronic systolic congestive heart failure (HCC)   . Morbid obesity (HCC)   . Uncontrolled hypertension   . Dysphagia, post-stroke   . Cardiomegaly   . Acute systolic congestive heart failure (HCC)   . 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 Cerebral embolism with cerebral infarction 07/11/2019  . Acute CVA  (cerebrovascular accident) (HCC) 07/11/2019  . Slurred speech 07/10/2019  . Cardiomyopathy (HCC) 11/20/2018  . History of stroke 09/06/2018  . Tobacco use 09/06/2018  . History of non-ST elevation myocardial infarction (NSTEMI) 05/05/2018  . Severe Vitamin D deficiency 04/02/2018  . Community acquired pneumonia of left lower lobe of lung 04/02/2018  . CKD (chronic kidney disease), stage III (HCC) = Secondary to Hypertension 04/02/2018  . Pneumonia 03/30/2018  . Metabolic acidosis 03/30/2018  . AKI (acute kidney injury) (HCC) 03/30/2018  . N&V (nausea and vomiting) 03/30/2018  . Essential hypertension 03/30/2018    Rylynne Schicker 05/25/2020, 11:28 AM Keene Breath, OTR/L [phone] Phone: [phone] 11:32 AM 05/25/20 [facility] [address] [place], [place], [address] Phone: [phone]   Fax:  [phone]  Name: THERRON SELLS MRN: [id number] Date of Birth: [birth year]

## 2020-05-25 NOTE — Patient Instructions (Signed)
   On a computer: [email or web address]  Or on a phone or tablet - Talk Path therapy App    Tyrail will want to do "Speech" > "ARTICULATION VIDEOS" > "Bilabials" or "Labiodentals" to watch and imitate  ======================================  Remember, say one word at a time to make it easier for people to understand you. Open your mouth as wide as you can, and try to make EVERY sound in the word.  Use an alphabet board if you have to, to spell the words.  You can always come back to get more speech in the future if you like (maybe 2022??)

## 2020-05-25 NOTE — Therapy (Signed)
[facility] [address] [place], [place], [address] Phone: [phone]   Fax:  [phone]  Speech Language Pathology Treatment/Discharge Summary  Patient Details  Name: Zachary Mccann MRN: [id number] Date of Birth: 1971/02/28 Referring Provider (SLP): Delice Lesch, MD   Encounter Date: 05/25/2020  End of Session - 05/25/20 1126    Visit Number  5    Number of Visits  5    Date for SLP Re-Evaluation  05/27/20    SLP Start Time  0931    SLP Stop Time   1013    SLP Time Calculation (min)  42 min    Activity Tolerance  Patient tolerated treatment well       Past Medical History:  Diagnosis Date  . CAD (coronary artery disease)    a. s/p NSTEMI in 04/2018 with DES to LCx.   [name] Hypertension   . Myocardial infarction ([place])   . Pneumonia   . Stroke [facility])     Past Surgical History:  Procedure Laterality Date  . CORONARY STENT INTERVENTION N/A 05/05/2018   Procedure: CORONARY STENT INTERVENTION;  Surgeon: Leonie Man, MD;  Location: [facility];  Service: Cardiovascular;  Laterality: N/A;  . LEFT HEART CATH AND CORONARY ANGIOGRAPHY N/A 05/05/2018   Procedure: LEFT HEART CATH AND CORONARY ANGIOGRAPHY;  Surgeon: Leonie Man, MD;  Location: [facility];  Service: Cardiovascular;  Laterality: N/A;  . TEE WITHOUT CARDIOVERSION N/A 09/08/2018   Procedure: TRANSESOPHAGEAL ECHOCARDIOGRAM (TEE);  Surgeon: Larey Dresser, MD;  Location: [facility] ENDOSCOPY;  Service: Cardiovascular;  Laterality: N/A;    There were no vitals filed for this visit.  Subjective Assessment - 05/25/20 0938    Subjective  Pt tells SLP he has been working on speech tasks at home, but did not/could not specifically tell SLP what exercises. Pt with premorbid dysfluency, pt reports not exacerbated post CVA.    Currently in Pain?  Yes    Pain Score  4     Pain Location  Knee    Pain Orientation  Right    Pain Descriptors / Indicators   Aching    Pain Onset  Yesterday    Pain Frequency  Intermittent    Aggravating Factors   weather    Pain Relieving Factors  nohting    Effect of Pain on Daily Activities  hard to walk            ADULT SLP TREATMENT - 05/25/20 0945      General Information   Behavior/Cognition  Alert;Cooperative      Treatment Provided   Treatment provided  Cognitive-Linquistic      Cognitive-Linquistic Treatment   Treatment focused on  Dysarthria;Apraxia    Skilled Treatment  Pt states he has worked on speech exercises since last session but cannot be more specific than this. "Talking" pt stated. SLP helped pt understand he could return for ST in the future should he desire more ST. Pt confirmed he wanted to stay with PT and OT currently, but may consider additionl ST in the future. SLP explained/reviewed the Medicaid visit limit policy and suggested maybe in 2022 when pt's visits are restocked. SLP helped pt work with his intelligibility by repeating sentences - all voiceless phonemes, consonant blends, and velar sounds were most difficult for pt. SLP used max (demonstration and modeling as well as additional verbal) cues for pt.       Assessment / Recommendations / Plan  Plan  Discharge SLP treatment due to (comment)   visits exhausted- pt wants to focus on PT/OT     Progression Toward Goals   Progression toward goals  Not progressing toward goals (comment)   severity, and visit limit -does not want self pay      SLP Education - 05/25/20 1123    Education Details  talk path therapy, medicaid visit limtis and pt could return when more visits are provided    Person(s) Educated  Patient    Methods  Explanation;Handout    Comprehension  Verbalized understanding         SLP Long Term Goals - 05/25/20 1133      SLP LONG TERM GOAL #1   Title  Pt will complete HEP for oral strengthening    Status  Partially Met      SLP LONG TERM GOAL #2   Title  pt will use compensations to improve  sentence (at least 8 words) intelligibility to 30%    Baseline  <20%    Status  Not Met      SLP LONG TERM GOAL #3   Title  pt will utilize low tech speech compenstory device PRN in 10 minutes conversation, 85% successfully, in 2 sessions    Baseline  not utilized yet    Status  Deferred       Plan - 05/25/20 1127    Clinical Impression Statement  Pt presents with significant oral speech deficits including severe dysarthria (R47.1) and verbal apraxia (R48.2). Pt is mildly concerned about his speech and more concerned about his walking/mobility and desires more of his sessions ultimately be put towards PT. Apraxia limits success with HEP and abdominal breathing; pt also reported today that he is not practicing these at home. Pt required mod cues with SLOP strategies in 3-5 word responses today; also requires cues to use AAC when breakdowns occur. He would benefit from a short course of ST targeting home exercises and compensations for his speech intelligibilty.    Duration  --   pt would like to put most of his visits into PT   Treatment/Interventions  Internal/external aids;Patient/family education;Compensatory strategies;SLP instruction and feedback;Functional tasks    Potential to Achieve Goals  Fair    Potential Considerations  Severity of impairments    Consulted and Agree with Plan of Care  Patient       Patient will benefit from skilled therapeutic intervention in order to improve the following deficits and impairments:   Dysarthria and anarthria  Verbal apraxia   SPEECH THERAPY DISCHARGE SUMMARY  Visits from Start of Care: 5  Current functional level related to goals / functional outcomes: See goal update above. Pt success in speech therapy was hindered mostly by what pt indicated was his home environment.   Remaining deficits: Severe - profound dysarthria, verbal apraxia   Education / Equipment: Compensations for speech intelligibility, how to practice during the day.    Plan: Patient agrees to discharge.  Patient goals were not met. Patient is being discharged due to                                                     ?????pt would like to focus at this time on physical deficits - allot visits to PTOT instead of ST.          Problem List Patient Active Problem List   Diagnosis Date Noted  . Acute ischemic left middle cerebral artery (MCA) stroke (HCC) 05/11/2020  . Cerebral infarction, watershed distribution, unilateral, acute (HCC) 05/11/2020  . Dysarthria as late effect of cerebrovascular accident (CVA) 05/11/2020  . Visual field constriction, bilateral 05/11/2020  . Hemiparesis affecting right side as late effect of cerebrovascular accident (HCC) 05/11/2020  . Dysarthria, post-stroke 04/07/2020  . 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  . Hyperglycemia   . Anemia   . Acute pain of right knee   . Primary osteoarthritis of right knee   . Expressive language impairment   . Benign essential HTN   . Acute bilat watershed infarction (HCC) 03/16/2020  . Cerebral infarction, watershed distribution, unilateral, chronic 03/16/2020  . Acute renal failure (HCC)   . History of CVA in adulthood   . Dyslipidemia   . CVA (cerebral vascular accident) (HCC) 03/11/2020  . Leukocytosis   . Hemiparesis affecting dominant side as late effect of stroke (HCC)   . Labile blood pressure   . Sleep disturbance   . Benign hypertensive heart and kidney disease with diastolic CHF, NYHA class II and CKD stage III (HCC)   . Chronic systolic congestive heart failure (HCC)   . Morbid obesity (HCC)   . Uncontrolled hypertension   . Dysphagia, post-stroke   . Cardiomegaly   . Acute systolic congestive heart failure (HCC)   . 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 Cerebral embolism with cerebral infarction 07/11/2019  . Acute CVA (cerebrovascular accident) (HCC) 07/11/2019  . Slurred speech 07/10/2019  . Cardiomyopathy (HCC) 11/20/2018  . History of stroke  09/06/2018  . Tobacco use 09/06/2018  . History of non-ST elevation myocardial infarction (NSTEMI) 05/05/2018  . Severe Vitamin D deficiency 04/02/2018  . Community acquired pneumonia of left lower lobe of lung 04/02/2018  . CKD (chronic kidney disease), stage III (HCC) = Secondary to Hypertension 04/02/2018  . Pneumonia 03/30/2018  . Metabolic acidosis 03/30/2018  . AKI (acute kidney injury) (HCC) 03/30/2018  . N&V (nausea and vomiting) 03/30/2018  . Essential hypertension 03/30/2018    SCHINKE,CARL ,MS, CCC-SLP  05/25/2020, 11:34 AM  [facility] [address] [place], [place], [address] Phone: [phone]   Fax:  [phone]   Name: Cheyne T Moose MRN: [id number] Date of Birth: 08/02/1971 

## 2020-05-25 NOTE — Therapy (Signed)
[facility] [address] [place], [place], [address] Phone: [phone]   Fax:  [phone]  Physical Therapy Treatment  Patient Details  Name: Zachary Mccann MRN: [id number] Date of Birth: 07-Apr-1971 Referring Provider (PT): Referred by Mariam Dollar but neurologist is Maryla Morrow   Encounter Date: 05/25/2020  PT End of Session - 05/25/20 0854    Visit Number  6    Number of Visits  11    Date for PT Re-Evaluation  06/07/20    Authorization Type  Medicaid 4/28-5/18 3 visits, 8 Visits 5/20 -  7/14    Authorization - Visit Number  3    Authorization - Number of Visits  8    PT Start Time  0848    PT Stop Time  0930    PT Time Calculation (min)  42 min    Equipment Utilized During Treatment  Gait belt    Activity Tolerance  Patient tolerated treatment well    Behavior During Therapy  [facility] for tasks assessed/performed       Past Medical History:  Diagnosis Date  . CAD (coronary artery disease)    a. s/p NSTEMI in 04/2018 with DES to LCx.   [name] Hypertension   . Myocardial infarction (HCC)   . Pneumonia   . Stroke [facility])     Past Surgical History:  Procedure Laterality Date  . CORONARY STENT INTERVENTION N/A 05/05/2018   Procedure: CORONARY STENT INTERVENTION;  Surgeon: Marykay Lex, MD;  Location: [facility] INVASIVE CV LAB;  Service: Cardiovascular;  Laterality: N/A;  . LEFT HEART CATH AND CORONARY ANGIOGRAPHY N/A 05/05/2018   Procedure: LEFT HEART CATH AND CORONARY ANGIOGRAPHY;  Surgeon: Marykay Lex, MD;  Location: [facility] INVASIVE CV LAB;  Service: Cardiovascular;  Laterality: N/A;  . TEE WITHOUT CARDIOVERSION N/A 09/08/2018   Procedure: TRANSESOPHAGEAL ECHOCARDIOGRAM (TEE);  Surgeon: Laurey Morale, MD;  Location: [facility] ENDOSCOPY;  Service: Cardiovascular;  Laterality: N/A;    There were no vitals filed for this visit.  Subjective Assessment - 05/25/20 0852    Subjective  Patient reporting it was not good weekend  due to son being in hospital.  Wife reports he has been doing better at home, working on standing some. Patient wife report she wil call about w/c when she gets a chance. Patient reports that has some knee pain today.    Currently in Pain?  Yes    Pain Score  4     Pain Location  Knee    Pain Orientation  Right    Pain Descriptors / Indicators  Aching    Pain Onset  Today   reports started this morning                       [facility] Adult PT Treatment/Exercise - 05/25/20 0904      Transfers   Transfers  Sit to Stand;Stand to Sit    Sit to Stand  3: Mod assist;4: Min assist    Sit to Stand Details  Tactile cues for weight shifting;Tactile cues for posture;Verbal cues for technique;Verbal cues for sequencing;Manual facilitation for weight shifting    Sit to Stand Details (indicate cue type and reason)  Right hand on walker, left pushing from mat. When completed from slightly elevated mat patient require Min A, from lower mat patient require Mod A.     Stand to Sit  4: Min assist    Stand to Sit Details (indicate cue  type and reason)  Verbal cues for technique    Stand to Sit Details  verbal cues for controlled descent      Ambulation/Gait   Ambulation/Gait  Yes    Ambulation/Gait Assistance  4: Min assist    Ambulation/Gait Assistance Details  completed gait training, verbal cues for improved step length and proper sequencing with RW to ensure proper advancement of RW during gait. Min A needed and PT provided stabilization to RW. complted gait training to mat, with PT providing verbal cues for turn and to ensure legs are touching surface prior to descent.     Ambulation Distance (Feet)  58 Feet    Assistive device  Rolling walker   Bariatric   Gait Pattern  Step-through pattern;Decreased hip/knee flexion - right;Decreased weight shift to right;Decreased stance time - left    Ambulation Surface  Level;Indoor    Pre-Gait Activities  In standing completed weight shifting to  RLE, with PT providing manual faciliation to pelvis for weight shift. Standing tolerance x 20-30 secs prior to require rest break. Completed x 3.       Self-Care   Self-Care  Other Self-Care Comments    Other Self-Care Comments   Wife present when dropping patient off for session. PT educated on calling about w/c to determine if this is still a rental and determine next step sto allow for getting patient a new wheelchair due to current being broken.       Neuro Re-ed    Neuro Re-ed Details   Standing at BRW with mirror in front, weight shifting to R and completing alterating stepping with L foot forward to target 2 x 10 reps. Extened rest breaks required between sets.       Exercises   Exercises  Knee/Hip    Other Exercises   Seated in w/c, worked on completing LAQ 1 x 10 reps. PT providing tactile cues during completion.                PT Short Term Goals - 05/10/20 1732      PT SHORT TERM GOAL #1   Title  Pt will be independent with initial HEP for strengthening, flexibility to continue with wife's assistance.    Baseline  HEP initiated    Time  4    Period  Weeks    Status  On-going    Target Date  05/08/20      PT SHORT TERM GOAL #2   Title  Pt will be CGA with bed mobility for improved function in home.    Baseline  currently still requiring Mod A with bed mobility, CGA for rolling R/L    Time  4    Period  Weeks    Status  On-going    Target Date  05/08/20      PT SHORT TERM GOAL #3   Title  Pt will be able to perform sit to stand from wheelchair mod assist for improved function/functional strength at home.    Baseline  max assist +2    Time  4    Period  Weeks    Status  On-going    Target Date  05/08/20      PT SHORT TERM GOAL #4   Title  Pt will ambulate 24' with RW min assist for improved household mobility.    Baseline  ambualte 58' with with Min A to Mod A with RW    Time  4    Period  Weeks  Status  On-going    Target Date  05/08/20        PT  Long Term Goals - 04/08/20 1423      PT LONG TERM GOAL #1   Title  Pt will be to perform progressive HEP with wife assist to continue gains on own.    Baseline  no current HEP    Time  10    Period  Weeks    Status  New    Target Date  06/17/20      PT LONG TERM GOAL #2   Title  Pt will be able to perform transfers CGA for improved functional strength and mobility.    Baseline  max assist +2    Time  10    Period  Weeks    Status  New    Target Date  06/17/20      PT LONG TERM GOAL #3   Title  Pt will be able to ambulate >100' CGA with RW for further improvement in household mobility.    Baseline  2' min assist to transfer mat to w/c with RW    Time  10    Period  Weeks    Status  New    Target Date  06/17/20      PT LONG TERM GOAL #4   Title  Pt will be able to maintain standing x 5 min with supervision with light UE support on walker or counter for improved balance and participation with ADLs.    Baseline  CGA/Min assist to stand at [facility] briefly    Time  10    Period  Weeks    Status  New    Target Date  06/17/20            Plan - 05/25/20 1117    Clinical Impression Statement  PT continued gait training with patient today with Bariatric RW and Min Assist. Continued transfer and sit <> stand training from various high surface. Also completed NMR activites focused on improved weight shift to R side. PT spoke to wife regarding w/c, wife reporting she will seek more information and call prior to next appointment. Pt will continue to benefit from skilled PT services to address deficits and progress toward all unmet goals.    Personal Factors and Comorbidities  Comorbidity 3+    Comorbidities  hypertension, tobacco abuse, CAD with non-STEMI 04/2018 as well as left ICA occlusion July 2020    Examination-Activity Limitations  Bathing;Hygiene/Grooming;Stairs;Bed Mobility;Locomotion Level;Stand;Toileting;Transfers    Examination-Participation Restrictions  Community Activity     Stability/Clinical Decision Making  Evolving/Moderate complexity    Rehab Potential  Good    PT Frequency  1x / week    PT Duration  --   10 weeks plus eval   PT Treatment/Interventions  ADLs/Self Care Home Management;Moist Heat;Electrical Stimulation;Gait training;DME Instruction;Functional mobility training;Stair training;Neuromuscular re-education;Balance training;Therapeutic exercise;Therapeutic activities;Patient/family education;Orthotic Fit/Training;Vestibular;Passive range of motion;Manual techniques;Wheelchair mobility training    PT Next Visit Plan  Update on w/c? continue gait, sit <> stand training, seated/supine strengthening    Consulted and Agree with Plan of Care  Patient       Patient will benefit from skilled therapeutic intervention in order to improve the following deficits and impairments:  Abnormal gait, Decreased activity tolerance, Decreased balance, Decreased endurance, Decreased mobility, Decreased knowledge of use of DME, Decreased range of motion, Decreased strength, Impaired UE functional use, Pain  Visit Diagnosis: Muscle weakness (generalized)  Other abnormalities of gait and mobility  Unsteadiness on feet  Hemiplegia and hemiparesis following cerebral infarction affecting right non-dominant side [facility])     Problem List Patient Active Problem List   Diagnosis Date Noted  . Acute ischemic left middle cerebral artery (MCA) stroke ([place]) 05/11/2020  . Cerebral infarction, watershed distribution, unilateral, acute ([place]) 05/11/2020  . Dysarthria as late effect of cerebrovascular accident (CVA) 05/11/2020  . Visual field constriction, bilateral 05/11/2020  . Hemiparesis affecting right side as late effect of cerebrovascular accident ([place]) 05/11/2020  . Dysarthria, post-stroke 04/07/2020  . 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  . Hyperglycemia   . Anemia   . Acute pain of right knee   . Primary osteoarthritis of right knee   . Expressive language impairment    . Benign essential HTN   . Acute bilat watershed infarction [facility]) 03/16/2020  . Cerebral infarction, watershed distribution, unilateral, chronic 03/16/2020  . Acute renal failure ([place])   . History of CVA in adulthood   . Dyslipidemia   . CVA (cerebral vascular accident) ([place]) 03/11/2020  . Leukocytosis   . Hemiparesis affecting dominant side as late effect of stroke ([place])   . Labile blood pressure   . Sleep disturbance   . Benign hypertensive heart and kidney disease with diastolic CHF, NYHA class II and CKD stage III ([place])   . Chronic systolic congestive heart failure ([place])   . Morbid obesity ([place])   . Uncontrolled hypertension   . Dysphagia, post-stroke   . Cardiomegaly   . Acute systolic congestive heart failure ([place])   . Left middle cerebral artery stroke ([place]) 07/17/2019  . Cerebral embolism with cerebral infarction 07/11/2019  . Acute CVA (cerebrovascular accident) ([place]) 07/11/2019  . Slurred speech 07/10/2019  . Cardiomyopathy ([place]) 11/20/2018  . History of stroke 09/06/2018  . Tobacco use 09/06/2018  . History of non-ST elevation myocardial infarction (NSTEMI) 05/05/2018  . Severe Vitamin D deficiency 04/02/2018  . Community acquired pneumonia of left lower lobe of lung 04/02/2018  . CKD (chronic kidney disease), stage III ([place]) = Secondary to Hypertension 04/02/2018  . Pneumonia 03/30/2018  . Metabolic acidosis 10/10/8526  . AKI (acute kidney injury) ([place]) 03/30/2018  . N&V (nausea and vomiting) 03/30/2018  . Essential hypertension 03/30/2018    Jones Bales, PT, DPT 05/25/2020, 11:34 AM  [facility] [address] [place], [place], [address] Phone: [phone]   Fax:  [phone]  Name: Zachary Mccann MRN: [id number] Date of Birth: Sep 12, 1971

## 2020-05-27 ENCOUNTER — Encounter: Payer: Medicaid Other | Admitting: Occupational Therapy

## 2020-05-27 ENCOUNTER — Ambulatory Visit: Payer: Medicaid Other

## 2020-05-31 ENCOUNTER — Ambulatory Visit: Payer: Medicaid Other | Admitting: Occupational Therapy

## 2020-05-31 ENCOUNTER — Ambulatory Visit: Payer: Medicaid Other

## 2020-05-31 ENCOUNTER — Telehealth: Payer: Self-pay

## 2020-05-31 NOTE — Telephone Encounter (Signed)
PT called and spoke to pt's wife about missed visit today. Their son was sick with a fever and did not want to take him out. Let her know next visit for Tuesday and she said they would be there. Number I reached them at was [phone] as home number listed not working currently. Elmer Bales, PT, DPT, NCS

## 2020-06-02 ENCOUNTER — Encounter: Payer: Medicaid Other | Admitting: Speech Pathology

## 2020-06-02 ENCOUNTER — Ambulatory Visit: Payer: Medicaid Other

## 2020-06-02 ENCOUNTER — Encounter: Payer: Medicaid Other | Admitting: Occupational Therapy

## 2020-06-07 ENCOUNTER — Ambulatory Visit: Payer: Medicaid Other

## 2020-06-07 ENCOUNTER — Ambulatory Visit: Payer: Medicaid Other | Admitting: Occupational Therapy

## 2020-06-07 ENCOUNTER — Other Ambulatory Visit: Payer: Self-pay

## 2020-06-07 ENCOUNTER — Encounter: Payer: Medicaid Other | Admitting: Speech Pathology

## 2020-06-07 DIAGNOSIS — I69353 Hemiplegia and hemiparesis following cerebral infarction affecting right non-dominant side: Secondary | ICD-10-CM

## 2020-06-07 DIAGNOSIS — R2681 Unsteadiness on feet: Secondary | ICD-10-CM

## 2020-06-07 DIAGNOSIS — M6281 Muscle weakness (generalized): Secondary | ICD-10-CM

## 2020-06-07 DIAGNOSIS — R2689 Other abnormalities of gait and mobility: Secondary | ICD-10-CM

## 2020-06-07 NOTE — Therapy (Signed)
[facility] [address] [place], [place], [address] Phone: [phone]   Fax:  [phone]  Physical Therapy Treatment  Patient Details  Name: Zachary Mccann MRN: [id number] Date of Birth: Oct 05, 1971 Referring Provider (PT): Referred by Lauraine Rinne but neurologist is Delice Lesch   Encounter Date: 06/07/2020   PT End of Session - 06/07/20 0937    Visit Number 7    Number of Visits 11    Date for PT Re-Evaluation 06/07/20    Authorization Type Medicaid 4/28-5/18 3 visits, 8 Visits 5/20 -  7/14    Authorization - Visit Number 4    Authorization - Number of Visits 8    PT Start Time 0930    PT Stop Time 1020    PT Time Calculation (min) 50 min    Equipment Utilized During Treatment Gait belt    Activity Tolerance Patient tolerated treatment well    Behavior During Therapy [facility] for tasks assessed/performed           Past Medical History:  Diagnosis Date  . CAD (coronary artery disease)    a. s/p NSTEMI in 04/2018 with DES to LCx.   [name] Hypertension   . Myocardial infarction ([place])   . Pneumonia   . Stroke [facility])     Past Surgical History:  Procedure Laterality Date  . CORONARY STENT INTERVENTION N/A 05/05/2018   Procedure: CORONARY STENT INTERVENTION;  Surgeon: Leonie Man, MD;  Location: [place] CV LAB;  Service: Cardiovascular;  Laterality: N/A;  . LEFT HEART CATH AND CORONARY ANGIOGRAPHY N/A 05/05/2018   Procedure: LEFT HEART CATH AND CORONARY ANGIOGRAPHY;  Surgeon: Leonie Man, MD;  Location: [place] CV LAB;  Service: Cardiovascular;  Laterality: N/A;  . TEE WITHOUT CARDIOVERSION N/A 09/08/2018   Procedure: TRANSESOPHAGEAL ECHOCARDIOGRAM (TEE);  Surgeon: Larey Dresser, MD;  Location: [facility] ENDOSCOPY;  Service: Cardiovascular;  Laterality: N/A;    There were no vitals filed for this visit.   Subjective Assessment - 06/07/20 0938    Subjective Pt just finished OT. Reports he is doing well  other than sinus issues. Walking a little bit at home.    Currently in Pain? No/denies    Pain Onset Today   reports started this morning                            [facility] Adult PT Treatment/Exercise - 06/07/20 0938      Transfers   Transfers Sit to Stand;Stand to Sit    Sit to Stand 2: Max assist   2 helpers   Sit to [employer] cues for technique    Sit to Stand Details (indicate cue type and reason) From low mat with right hand on walker and left hand pushing.  Verbal cues to lean forward.    Stand to Sit 4: Min assist    Stand to Sit Details (indicate cue type and reason) Verbal cues for technique    Comments Sit to stand from elevated edge of mat x 3 with verbal cues to lean forward and use momentum to help rise min assist. Mat was at 24". Able to perform with 1 assist from elevated mat      Ambulation/Gait   Ambulation/Gait Yes    Ambulation/Gait Assistance 4: Min assist    Ambulation/Gait Assistance Details Pt was cued to stay up in walker for more erect posture. Initally had RW from OT and  was cued to try to watch right foot clearance to not hit foot on side of walker as right foot ER. Switched to bariatric RW during second bout which helped.     Ambulation Distance (Feet) 60 Feet   35'   Assistive device Rolling walker    Gait Pattern Step-to pattern;Decreased stance time - right;Decreased step length - left;Decreased step length - right;Decreased hip/knee flexion - right;Decreased dorsiflexion - right    Ambulation Surface Level;Indoor    Gait Comments BP=138/86 after gait      Neuro Re-ed    Neuro Re-ed Details  Standing at bariatric RW: weight shifting side to side x 6 with verbal and tactile cues to tighten right quad and glut to stand more erect with right weight shift. Standing with raising left arm overhead x 10 with verbal and tactile cues to stand erect.      Exercises   Exercises Other Exercises    Other Exercises  Seated LAQ 10 x 2 right  then right hamstring curl with foot on roller with light resistance from PT x 10. Visual cues as targets for LAQ.      Knee/Hip Exercises: Aerobic   Nustep 3 min level 2 with verbal cues to keep hips in more neutral position. Max assist to place right leg on pedal to begin with. Pt utilized UE and BLE.                    PT Short Term Goals - 05/10/20 1732      PT SHORT TERM GOAL #1   Title Pt will be independent with initial HEP for strengthening, flexibility to continue with wife's assistance.    Baseline HEP initiated    Time 4    Period Weeks    Status On-going    Target Date 05/08/20      PT SHORT TERM GOAL #2   Title Pt will be CGA with bed mobility for improved function in home.    Baseline currently still requiring Mod A with bed mobility, CGA for rolling R/L    Time 4    Period Weeks    Status On-going    Target Date 05/08/20      PT SHORT TERM GOAL #3   Title Pt will be able to perform sit to stand from wheelchair mod assist for improved function/functional strength at home.    Baseline max assist +2    Time 4    Period Weeks    Status On-going    Target Date 05/08/20      PT SHORT TERM GOAL #4   Title Pt will ambulate 13' with RW min assist for improved household mobility.    Baseline ambualte 88' with with Min A to Mod A with RW    Time 4    Period Weeks    Status On-going    Target Date 05/08/20             PT Long Term Goals - 04/08/20 1423      PT LONG TERM GOAL #1   Title Pt will be to perform progressive HEP with wife assist to continue gains on own.    Baseline no current HEP    Time 10    Period Weeks    Status New    Target Date 06/17/20      PT LONG TERM GOAL #2   Title Pt will be able to perform transfers CGA for improved functional strength and mobility.  Baseline max assist +2    Time 10    Period Weeks    Status New    Target Date 06/17/20      PT LONG TERM GOAL #3   Title Pt will be able to ambulate >100' CGA with  RW for further improvement in household mobility.    Baseline 2' min assist to transfer mat to w/c with RW    Time 10    Period Weeks    Status New    Target Date 06/17/20      PT LONG TERM GOAL #4   Title Pt will be able to maintain standing x 5 min with supervision with light UE support on walker or counter for improved balance and participation with ADLs.    Baseline CGA/Min assist to stand at [facility] briefly    Time 10    Period Weeks    Status New    Target Date 06/17/20                 Plan - 06/07/20 1706    Clinical Impression Statement PT continued to focus on transfers and gait/pregait activities today. Pt needs frequent breaks during session. Pt doing better keeping more upright posture in standing and gait. Pt still unsure if w/c will be able to be fixed or not. Says rental from [employer].    Personal Factors and Comorbidities Comorbidity 3+    Comorbidities hypertension, tobacco abuse, CAD with non-STEMI 04/2018 as well as left ICA occlusion July 2020    Examination-Activity Limitations Bathing;Hygiene/Grooming;Stairs;Bed Mobility;Locomotion Level;Stand;Toileting;Transfers    Examination-Participation Restrictions Community Activity    Stability/Clinical Decision Making Evolving/Moderate complexity    Rehab Potential Good    PT Frequency 1x / week    PT Duration --   10 weeks plus eval   PT Treatment/Interventions ADLs/Self Care Home Management;Moist Heat;Electrical Stimulation;Gait training;DME Instruction;Functional mobility training;Stair training;Neuromuscular re-education;Balance training;Therapeutic exercise;Therapeutic activities;Patient/family education;Orthotic Fit/Training;Vestibular;Passive range of motion;Manual techniques;Wheelchair mobility training    PT Next Visit Plan Update on w/c? PT will try to call [employer] to ask if gets time.  continue gait, sit <> stand training, seated/supine strengthening. Start checking goals and update target dates.    Consulted  and Agree with Plan of Care Patient           Patient will benefit from skilled therapeutic intervention in order to improve the following deficits and impairments:  Abnormal gait, Decreased activity tolerance, Decreased balance, Decreased endurance, Decreased mobility, Decreased knowledge of use of DME, Decreased range of motion, Decreased strength, Impaired UE functional use, Pain  Visit Diagnosis: Other abnormalities of gait and mobility  Muscle weakness (generalized)  Hemiplegia and hemiparesis following cerebral infarction affecting right non-dominant side [facility])     Problem List Patient Active Problem List   Diagnosis Date Noted  . Acute ischemic left middle cerebral artery (MCA) stroke (HCC) 05/11/2020  . Cerebral infarction, watershed distribution, unilateral, acute (HCC) 05/11/2020  . Dysarthria as late effect of cerebrovascular accident (CVA) 05/11/2020  . Visual field constriction, bilateral 05/11/2020  . Hemiparesis affecting right side as late effect of cerebrovascular accident (HCC) 05/11/2020  . Dysarthria, post-stroke 04/07/2020  . 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  . Hyperglycemia   . Anemia   . Acute pain of right knee   . Primary osteoarthritis of right knee   . Expressive language impairment   . Benign essential HTN   . Acute bilat watershed infarction [facility]) 03/16/2020  . Cerebral infarction, watershed distribution, unilateral, chronic 03/16/2020  .  Acute renal failure (HCC)   . History of CVA in adulthood   . Dyslipidemia   . CVA (cerebral vascular accident) (HCC) 03/11/2020  . Leukocytosis   . Hemiparesis affecting dominant side as late effect of stroke (HCC)   . Labile blood pressure   . Sleep disturbance   . Benign hypertensive heart and kidney disease with diastolic CHF, NYHA class II and CKD stage III (HCC)   . Chronic systolic congestive heart failure (HCC)   . Morbid obesity (HCC)   . Uncontrolled hypertension   . Dysphagia, post-stroke     . Cardiomegaly   . Acute systolic congestive heart failure (HCC)   . 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 Cerebral embolism with cerebral infarction 07/11/2019  . Acute CVA (cerebrovascular accident) (HCC) 07/11/2019  . Slurred speech 07/10/2019  . Cardiomyopathy (HCC) 11/20/2018  . History of stroke 09/06/2018  . Tobacco use 09/06/2018  . History of non-ST elevation myocardial infarction (NSTEMI) 05/05/2018  . Severe Vitamin D deficiency 04/02/2018  . Community acquired pneumonia of left lower lobe of lung 04/02/2018  . CKD (chronic kidney disease), stage III (HCC) = Secondary to Hypertension 04/02/2018  . Pneumonia 03/30/2018  . Metabolic acidosis 03/30/2018  . AKI (acute kidney injury) (HCC) 03/30/2018  . N&V (nausea and vomiting) 03/30/2018  . Essential hypertension 03/30/2018    Ronn Melena, PT, DPT, NCS 06/07/2020, 5:10 PM  [facility] [address] [place], [place], [address] Phone: [phone]   Fax:  [phone]  Name: BRADIE LACOCK MRN: [id number] Date of Birth: 07/15/1971

## 2020-06-07 NOTE — Therapy (Signed)
[facility] [address] [place], [place], [address] Phone: [phone]   Fax:  [phone]  Occupational Therapy Treatment  Patient Details  Name: Zachary Mccann MRN: [id number] Date of Birth: Jun 15, 1971 No data recorded  Encounter Date: 06/07/2020   OT End of Session - 06/07/20 1253    Visit Number 6    Number of Visits 11    Date for OT Re-Evaluation 06/28/20    Authorization Type MCD    Authorization Time Period Approved 10 visits from 5/13 - 07/06/20    Authorization - Visit Number 5    Authorization - Number of Visits 10    OT Start Time 0845    OT Stop Time 0930    OT Time Calculation (min) 45 min    Activity Tolerance Patient tolerated treatment well    Behavior During Therapy [facility] for tasks assessed/performed           Past Medical History:  Diagnosis Date  . CAD (coronary artery disease)    a. s/p NSTEMI in 04/2018 with DES to LCx.   [name] Hypertension   . Myocardial infarction (HCC)   . Pneumonia   . Stroke [facility])     Past Surgical History:  Procedure Laterality Date  . CORONARY STENT INTERVENTION N/A 05/05/2018   Procedure: CORONARY STENT INTERVENTION;  Surgeon: Marykay Lex, MD;  Location: [facility] INVASIVE CV LAB;  Service: Cardiovascular;  Laterality: N/A;  . LEFT HEART CATH AND CORONARY ANGIOGRAPHY N/A 05/05/2018   Procedure: LEFT HEART CATH AND CORONARY ANGIOGRAPHY;  Surgeon: Marykay Lex, MD;  Location: [facility] INVASIVE CV LAB;  Service: Cardiovascular;  Laterality: N/A;  . TEE WITHOUT CARDIOVERSION N/A 09/08/2018   Procedure: TRANSESOPHAGEAL ECHOCARDIOGRAM (TEE);  Surgeon: Laurey Morale, MD;  Location: [facility] ENDOSCOPY;  Service: Cardiovascular;  Laterality: N/A;    There were no vitals filed for this visit.   Subjective Assessment - 06/07/20 1252    Pertinent History New CVA 03/11/20 w/ increased weakness on Rt side. Lt CVA w/ Rt hemiparesis July 2020, HTN, CAD, MI, s/p NSTEMI May 2019.     Limitations assist x 2 to stand and transfer, fall risk, apraxic    Currently in Pain? No/denies          Worked on functional level transfers (scoot pivot) requiring max assist x 1 and min assist x 1 to transfer to Rt weaker side. Min assist x 2 to transfer to Lt stronger side. Worked on scooting side to side and focused on biomechanics and wt shifts/positioning needed for efficient scooting, transfers, and sit to stand. Sit to stand x 5 with less assistance needed as pt improved positioning and shifting weight forward.  Low level functional reaching RUE to pick up and move cones - pt required cues for effective reaching including fully opening hand prior to grasping cone and fully releasing cone prior to taking arm away.                         OT Short Term Goals - 05/18/20 1213      OT SHORT TERM GOAL #1   Title Independent with HEP    Baseline dependent, not yet issued    Time 5    Period Weeks    Status Achieved      OT SHORT TERM GOAL #2   Title Pt to perform 50* or greater shoulder flexion RUE for low level reaching/assist w/ ADLS  Baseline 30*    Time 5    Period Weeks    Status On-going      OT SHORT TERM GOAL #3   Title Pt to perform UE dressing I'ly    Baseline mod assist    Time 5    Period Weeks    Status On-going   Pt with min assist to don, able to doff I'ly     OT SHORT TERM GOAL #4   Title Pt to perform transfer to [facility] with max assist x 1    Baseline total dependence    Time 5    Period Weeks    Status On-going      OT SHORT TERM GOAL #5   Title Pt to improve RUE function as evidenced by performing 15 blocks on Box & Blocks test    Baseline Rt = 9    Time 5    Period Weeks    Status On-going             OT Long Term Goals - 05/25/20 1126      OT LONG TERM GOAL #1   Title Pt to perform LE bathing and dressing with mod assist (max assist to stand to pull up over hips) with A/E prn    Baseline max assist to total dependence     Time 10    Period Weeks    Status New      OT LONG TERM GOAL #2   Title Pt to perform level transfers with mod assist    Baseline max assist x 2, or total dependence    Time 10    Period Weeks    Status New      OT LONG TERM GOAL #3   Title Pt to perform RUE shoulder flexion to 70* or greater for consistent low level reaching    Baseline 30*    Time 10    Period Weeks    Status New      OT LONG TERM GOAL #4   Title Grip strength Rt hand to be 20 lbs or greater to assist with opening containers    Baseline 10 lbs    Time 10    Period Weeks    Status New      OT LONG TERM GOAL #5   Title Pt to make simple snack from w/c level, standing at counter to retrieve items from higher shelves    Baseline dependent - pt only getting snack from w/c level    Time 10    Period Weeks    Status New                 Plan - 06/07/20 1254    Clinical Impression Statement Pt is progressing towards goals. He demonstrates improving RUE functional use and coordination and transfers    Occupational performance deficits (Please refer to evaluation for details): ADL's;IADL's;Social Participation    Body Structure / Function / Physical Skills ADL;ROM;IADL;Body mechanics;Endurance;Sensation;Mobility;Flexibility;Strength;Coordination;FMC;Obesity;UE functional use;Decreased knowledge of precautions;Decreased knowledge of use of DME    Rehab Potential Fair    Comorbidities impacting occupational performance description: residual Rt hemiparesis from 1st stroke made worse by second stroke    OT Frequency 1x / week    OT Duration --   10 weeks   OT Treatment/Interventions Self-care/ADL training;Therapeutic exercise;Functional Mobility Training;Neuromuscular education;Splinting;Manual Therapy;Therapeutic activities;Coping strategies training;DME and/or AE instruction;Cognitive remediation/compensation;Visual/perceptual remediation/compensation;Passive range of motion;Patient/family education    Plan  assess STG's    Consulted and  Agree with Plan of Care Patient           Patient will benefit from skilled therapeutic intervention in order to improve the following deficits and impairments:   Body Structure / Function / Physical Skills: ADL, ROM, IADL, Body mechanics, Endurance, Sensation, Mobility, Flexibility, Strength, Coordination, FMC, Obesity, UE functional use, Decreased knowledge of precautions, Decreased knowledge of use of DME       Visit Diagnosis: Hemiplegia and hemiparesis following cerebral infarction affecting right non-dominant side (HCC)  Muscle weakness (generalized)  Unsteadiness on feet    Problem List Patient Active Problem List   Diagnosis Date Noted  . Acute ischemic left middle cerebral artery (MCA) stroke ([place]) 05/11/2020  . Cerebral infarction, watershed distribution, unilateral, acute ([place]) 05/11/2020  . Dysarthria as late effect of cerebrovascular accident (CVA) 05/11/2020  . Visual field constriction, bilateral 05/11/2020  . Hemiparesis affecting right side as late effect of cerebrovascular accident ([place]) 05/11/2020  . Dysarthria, post-stroke 04/07/2020  . 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  . Hyperglycemia   . Anemia   . Acute pain of right knee   . Primary osteoarthritis of right knee   . Expressive language impairment   . Benign essential HTN   . Acute bilat watershed infarction [facility]) 03/16/2020  . Cerebral infarction, watershed distribution, unilateral, chronic 03/16/2020  . Acute renal failure ([place])   . History of CVA in adulthood   . Dyslipidemia   . CVA (cerebral vascular accident) ([place]) 03/11/2020  . Leukocytosis   . Hemiparesis affecting dominant side as late effect of stroke ([place])   . Labile blood pressure   . Sleep disturbance   . Benign hypertensive heart and kidney disease with diastolic CHF, NYHA class II and CKD stage III ([place])   . Chronic systolic congestive heart failure ([place])   . Morbid obesity ([place])   . Uncontrolled  hypertension   . Dysphagia, post-stroke   . Cardiomegaly   . Acute systolic congestive heart failure ([place])   . Left middle cerebral artery stroke ([place]) 07/17/2019  . Cerebral embolism with cerebral infarction 07/11/2019  . Acute CVA (cerebrovascular accident) ([place]) 07/11/2019  . Slurred speech 07/10/2019  . Cardiomyopathy ([place]) 11/20/2018  . History of stroke 09/06/2018  . Tobacco use 09/06/2018  . History of non-ST elevation myocardial infarction (NSTEMI) 05/05/2018  . Severe Vitamin D deficiency 04/02/2018  . Community acquired pneumonia of left lower lobe of lung 04/02/2018  . CKD (chronic kidney disease), stage III ([place]) = Secondary to Hypertension 04/02/2018  . Pneumonia 03/30/2018  . Metabolic acidosis 71/05/2693  . AKI (acute kidney injury) ([place]) 03/30/2018  . N&V (nausea and vomiting) 03/30/2018  . Essential hypertension 03/30/2018    Carey Bullocks, OTR/L 06/07/2020, 12:55 PM  [facility] [address] [place], [place], [address] Phone: [phone]   Fax:  [phone]  Name: CHARLEE WHITEBREAD MRN: [id number] Date of Birth: 01-03-1971

## 2020-06-09 ENCOUNTER — Encounter: Payer: Medicaid Other | Admitting: Occupational Therapy

## 2020-06-09 ENCOUNTER — Encounter: Payer: Medicaid Other | Admitting: Speech Pathology

## 2020-06-09 ENCOUNTER — Ambulatory Visit: Payer: Medicaid Other

## 2020-06-14 ENCOUNTER — Ambulatory Visit: Payer: Medicaid Other

## 2020-06-14 ENCOUNTER — Other Ambulatory Visit: Payer: Self-pay

## 2020-06-14 ENCOUNTER — Ambulatory Visit: Payer: Medicaid Other | Admitting: Occupational Therapy

## 2020-06-14 ENCOUNTER — Encounter: Payer: Medicaid Other | Admitting: Speech Pathology

## 2020-06-14 DIAGNOSIS — M6281 Muscle weakness (generalized): Secondary | ICD-10-CM

## 2020-06-14 DIAGNOSIS — I69353 Hemiplegia and hemiparesis following cerebral infarction affecting right non-dominant side: Secondary | ICD-10-CM

## 2020-06-14 DIAGNOSIS — R2689 Other abnormalities of gait and mobility: Secondary | ICD-10-CM

## 2020-06-14 DIAGNOSIS — R278 Other lack of coordination: Secondary | ICD-10-CM

## 2020-06-14 DIAGNOSIS — R2681 Unsteadiness on feet: Secondary | ICD-10-CM

## 2020-06-14 NOTE — Patient Instructions (Signed)
ELBOW: Extension / Chest Press (Frame)    Lie on back with knees bent. Straighten elbows to raise frame (can use cane, long umbrella, yard stick, etc) . Hold _5__ seconds.  _10__ reps per set, __2_ sets per day  Chest Press    SEATED ALONG TABLE: In shoulder width stance with pool noodle at chest and hands in punch position, press arms straight ahead using table to help slide arms out. Repeat _10_ times per set. Do _2_ sets per day

## 2020-06-14 NOTE — Therapy (Signed)
[facility] [address] [place], [place], [address] Phone: [phone]   Fax:  [phone]  Occupational Therapy Treatment  Patient Details  Name: Zachary Mccann MRN: [id number] Date of Birth: 17-Jul-1971 No data recorded  Encounter Date: 06/14/2020   OT End of Session - 06/14/20 1116    Visit Number 7    Number of Visits 11    Date for OT Re-Evaluation 06/28/20    Authorization Type MCD    Authorization Time Period Approved 10 visits from 5/13 - 07/06/20    Authorization - Visit Number 6    Authorization - Number of Visits 10    OT Start Time 1015    OT Stop Time 1100    OT Time Calculation (min) 45 min    Activity Tolerance Patient tolerated treatment well    Behavior During Therapy [facility] for tasks assessed/performed           Past Medical History:  Diagnosis Date  . CAD (coronary artery disease)    a. s/p NSTEMI in 04/2018 with DES to LCx.   [name] Hypertension   . Myocardial infarction (HCC)   . Pneumonia   . Stroke [facility])     Past Surgical History:  Procedure Laterality Date  . CORONARY STENT INTERVENTION N/A 05/05/2018   Procedure: CORONARY STENT INTERVENTION;  Surgeon: Marykay Lex, MD;  Location: [facility] INVASIVE CV LAB;  Service: Cardiovascular;  Laterality: N/A;  . LEFT HEART CATH AND CORONARY ANGIOGRAPHY N/A 05/05/2018   Procedure: LEFT HEART CATH AND CORONARY ANGIOGRAPHY;  Surgeon: Marykay Lex, MD;  Location: [facility] INVASIVE CV LAB;  Service: Cardiovascular;  Laterality: N/A;  . TEE WITHOUT CARDIOVERSION N/A 09/08/2018   Procedure: TRANSESOPHAGEAL ECHOCARDIOGRAM (TEE);  Surgeon: Laurey Morale, MD;  Location: [facility] ENDOSCOPY;  Service: Cardiovascular;  Laterality: N/A;    There were no vitals filed for this visit.   Subjective Assessment - 06/14/20 1021    Subjective  Pt reports pain Rt knee    Pertinent History New CVA 03/11/20 w/ increased weakness on Rt side. Lt CVA w/ Rt hemiparesis July 2020, HTN,  CAD, MI, s/p NSTEMI May 2019.    Limitations assist x 2 to stand and transfer, fall risk, apraxic    Currently in Pain? Yes   O.T. not addressing   Pain Score 6     Pain Location Knee    Pain Orientation Right          Assessed progress towards STG's - see below. Worked on low to mid level reaching RUE w/ cues to fully open hand in prep for grasping w/ cup, graded pressure for pinch while picking up blocks, and placing in bowl, and picking up checkers and putting in cup. Pt w/ difficulty extending elbow and grading pressure of pincer grasp (partly d/t apraxia). Reviewed previously issued HEP for sh flexion and functional reaching activities.  Worked on elbow extension in AA/ROM gravity eliminated planes along lap and table with pool noodle. Pt also shown chest press in supine for elbow extension. Transfer from high mat to w/c using stand pivot technique with mod assist to stand (from higher surface) and cues to stay forward and then min cues for stepping and directional cues d/t apraxia.                         OT Short Term Goals - 06/14/20 1116      OT SHORT TERM GOAL #1  Title Independent with HEP    Baseline dependent, not yet issued    Time 5    Period Weeks    Status Achieved      OT SHORT TERM GOAL #2   Title Pt to perform 50* or greater shoulder flexion RUE for low level reaching/assist w/ ADLS    Baseline 30*    Time 5    Period Weeks    Status On-going   06/14/20: 40-45* sh flexion with elbow flexed     OT SHORT TERM GOAL #3   Title Pt to perform UE dressing I'ly    Baseline mod assist    Time 5    Period Weeks    Status On-going   Pt with min assist to don, able to doff I'ly     OT SHORT TERM GOAL #4   Title Pt to perform transfer to [facility] with max assist x 1    Baseline total dependence    Time 5    Period Weeks    Status Achieved      OT SHORT TERM GOAL #5   Title Pt to improve RUE function as evidenced by performing 15 blocks on Box &  Blocks test    Baseline Rt = 9    Time 5    Period Weeks    Status On-going   06/14/20: 11 blocks            OT Long Term Goals - 05/25/20 1126      OT LONG TERM GOAL #1   Title Pt to perform LE bathing and dressing with mod assist (max assist to stand to pull up over hips) with A/E prn    Baseline max assist to total dependence    Time 10    Period Weeks    Status New      OT LONG TERM GOAL #2   Title Pt to perform level transfers with mod assist    Baseline max assist x 2, or total dependence    Time 10    Period Weeks    Status New      OT LONG TERM GOAL #3   Title Pt to perform RUE shoulder flexion to 70* or greater for consistent low level reaching    Baseline 30*    Time 10    Period Weeks    Status New      OT LONG TERM GOAL #4   Title Grip strength Rt hand to be 20 lbs or greater to assist with opening containers    Baseline 10 lbs    Time 10    Period Weeks    Status New      OT LONG TERM GOAL #5   Title Pt to make simple snack from w/c level, standing at counter to retrieve items from higher shelves    Baseline dependent - pt only getting snack from w/c level    Time 10    Period Weeks    Status New                 Plan - 06/14/20 1117    Clinical Impression Statement Pt is progressing towards goals. He demonstrates slow improvement RUE functional use and coordination and transfers. ? carryover at home    Occupational performance deficits (Please refer to evaluation for details): ADL's;IADL's;Social Participation    Body Structure / Function / Physical Skills ADL;ROM;IADL;Body mechanics;Endurance;Sensation;Mobility;Flexibility;Strength;Coordination;FMC;Obesity;UE functional use;Decreased knowledge of precautions;Decreased knowledge of use of DME  Rehab Potential Fair    OT Frequency 1x / week    OT Duration --   10 weeks   OT Treatment/Interventions Self-care/ADL training;Therapeutic exercise;Functional Mobility Training;Neuromuscular  education;Splinting;Manual Therapy;Therapeutic activities;Coping strategies training;DME and/or AE instruction;Cognitive remediation/compensation;Visual/perceptual remediation/compensation;Passive range of motion;Patient/family education    Plan continue NMR RUE, functional low level reaching, and transfers    Consulted and Agree with Plan of Care Patient           Patient will benefit from skilled therapeutic intervention in order to improve the following deficits and impairments:   Body Structure / Function / Physical Skills: ADL, ROM, IADL, Body mechanics, Endurance, Sensation, Mobility, Flexibility, Strength, Coordination, FMC, Obesity, UE functional use, Decreased knowledge of precautions, Decreased knowledge of use of DME       Visit Diagnosis: Hemiplegia and hemiparesis following cerebral infarction affecting right non-dominant side (HCC)  Muscle weakness (generalized)  Unsteadiness on feet  Other lack of coordination    Problem List Patient Active Problem List   Diagnosis Date Noted  . Acute ischemic left middle cerebral artery (MCA) stroke (HCC) 05/11/2020  . Cerebral infarction, watershed distribution, unilateral, acute (HCC) 05/11/2020  . Dysarthria as late effect of cerebrovascular accident (CVA) 05/11/2020  . Visual field constriction, bilateral 05/11/2020  . Hemiparesis affecting right side as late effect of cerebrovascular accident (HCC) 05/11/2020  . Dysarthria, post-stroke 04/07/2020  . 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  . Hyperglycemia   . Anemia   . Acute pain of right knee   . Primary osteoarthritis of right knee   . Expressive language impairment   . Benign essential HTN   . Acute bilat watershed infarction [facility]) 03/16/2020  . Cerebral infarction, watershed distribution, unilateral, chronic 03/16/2020  . Acute renal failure (HCC)   . History of CVA in adulthood   . Dyslipidemia   . CVA (cerebral vascular accident) (HCC) 03/11/2020  . Leukocytosis    . Hemiparesis affecting dominant side as late effect of stroke (HCC)   . Labile blood pressure   . Sleep disturbance   . Benign hypertensive heart and kidney disease with diastolic CHF, NYHA class II and CKD stage III (HCC)   . Chronic systolic congestive heart failure (HCC)   . Morbid obesity (HCC)   . Uncontrolled hypertension   . Dysphagia, post-stroke   . Cardiomegaly   . Acute systolic congestive heart failure (HCC)   . 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 Cerebral embolism with cerebral infarction 07/11/2019  . Acute CVA (cerebrovascular accident) (HCC) 07/11/2019  . Slurred speech 07/10/2019  . Cardiomyopathy (HCC) 11/20/2018  . History of stroke 09/06/2018  . Tobacco use 09/06/2018  . History of non-ST elevation myocardial infarction (NSTEMI) 05/05/2018  . Severe Vitamin D deficiency 04/02/2018  . Community acquired pneumonia of left lower lobe of lung 04/02/2018  . CKD (chronic kidney disease), stage III (HCC) = Secondary to Hypertension 04/02/2018  . Pneumonia 03/30/2018  . Metabolic acidosis 03/30/2018  . AKI (acute kidney injury) (HCC) 03/30/2018  . N&V (nausea and vomiting) 03/30/2018  . Essential hypertension 03/30/2018    Kelli Churn, OTR/L 06/14/2020, 11:19 AM  [facility] [address] [place], [place], [address] Phone: [phone]   Fax:  [phone]  Name: Zachary Mccann MRN: [id number] Date of Birth: February 14, 1971

## 2020-06-14 NOTE — Therapy (Signed)
[facility] [address] [place], [place], [address] Phone: [phone]   Fax:  [phone]  Physical Therapy Treatment  Patient Details  Name: Zachary Mccann MRN: [id number] Date of Birth: 18-Sep-1971 Referring Provider (PT): Referred by Mariam Dollar but neurologist is Maryla Morrow   Encounter Date: 06/14/2020   PT End of Session - 06/14/20 0941    Visit Number 8    Number of Visits 11    Date for PT Re-Evaluation 06/07/20    Authorization Type Medicaid 4/28-5/18 3 visits, 8 Visits 5/20 -  7/14    Authorization - Visit Number 5    Authorization - Number of Visits 8    PT Start Time 0936    PT Stop Time 1015    PT Time Calculation (min) 39 min    Equipment Utilized During Treatment Gait belt    Activity Tolerance Patient tolerated treatment well    Behavior During Therapy [facility] for tasks assessed/performed           Past Medical History:  Diagnosis Date  . CAD (coronary artery disease)    a. s/p NSTEMI in 04/2018 with DES to LCx.   [name] Hypertension   . Myocardial infarction (HCC)   . Pneumonia   . Stroke [facility])     Past Surgical History:  Procedure Laterality Date  . CORONARY STENT INTERVENTION N/A 05/05/2018   Procedure: CORONARY STENT INTERVENTION;  Surgeon: Marykay Lex, MD;  Location: [facility] INVASIVE CV LAB;  Service: Cardiovascular;  Laterality: N/A;  . LEFT HEART CATH AND CORONARY ANGIOGRAPHY N/A 05/05/2018   Procedure: LEFT HEART CATH AND CORONARY ANGIOGRAPHY;  Surgeon: Marykay Lex, MD;  Location: [facility] INVASIVE CV LAB;  Service: Cardiovascular;  Laterality: N/A;  . TEE WITHOUT CARDIOVERSION N/A 09/08/2018   Procedure: TRANSESOPHAGEAL ECHOCARDIOGRAM (TEE);  Surgeon: Laurey Morale, MD;  Location: [facility] ENDOSCOPY;  Service: Cardiovascular;  Laterality: N/A;    There were no vitals filed for this visit.   Subjective Assessment - 06/14/20 0941    Subjective Pt reports that he did get a call from [employer]  about replacing the w/c as PT had called last week and was told they would. They are waiting on it to come in.    Currently in Pain? Yes    Pain Score 6     Pain Location Knee    Pain Orientation Right    Pain Descriptors / Indicators Aching    Pain Type Chronic pain    Pain Onset More than a month ago   reports started this morning   Pain Frequency Intermittent                             [facility] Adult PT Treatment/Exercise - 06/14/20 0001      Transfers   Transfers Sit to Stand;Stand to Sit    Sit to Stand 2: Max assist   +2    Sit to Stand Details Verbal cues for technique    Sit to Stand Details (indicate cue type and reason) From w/c pt was max assist +2 to rise. From elevated mat CGA/min assist    Five time sit to stand comments  Pt performed sit to stand from 22.5" mat x 5 CGA with verbal cues for sequencing then x 5 from 21.5" mat CGA/min assist. Pt was cued to really lean forward to help with rising. Decreased eccentric control wtih lowering the last bit.  Stand to Sit 4: Min assist    Stand to Sit Details (indicate cue type and reason) Verbal cues for technique;Manual facilitation for weight shifting      Ambulation/Gait   Ambulation/Gait Yes    Ambulation/Gait Assistance 4: Min assist    Ambulation/Gait Assistance Details Pt was cued to stay up in walker. Able to step past right foot with left but pelvis rotates posterior on right. PT attempted to decrease trunk rotation back during right stance which challenged pt more. Needed seated rest break during gait bouts. HR=94 after gait    Ambulation Distance (Feet) 75 Feet   40' x 1   Assistive device Rolling walker   bariatric RW, w/c follow   Gait Pattern Step-through pattern;Step-to pattern;Decreased stance time - right;Decreased dorsiflexion - right;Decreased hip/knee flexion - right;Trunk rotated posteriorly on right    Ambulation Surface Level;Indoor      Neuro Re-ed    Neuro Re-ed Details  Standing at  walker with alternating shoulder flexion x 5 each side with only 1 UE support CGA. Standing at walker without UE support 2 min 45 sec with reaching for targets the last min. Pt was given verbal cues to tighten tummy and stand tall during activity. Standing march x 2 each leg with difficulty shifting over right leg min assist.                    PT Short Term Goals - 05/10/20 1732      PT SHORT TERM GOAL #1   Title Pt will be independent with initial HEP for strengthening, flexibility to continue with wife's assistance.    Baseline HEP initiated    Time 4    Period Weeks    Status On-going    Target Date 05/08/20      PT SHORT TERM GOAL #2   Title Pt will be CGA with bed mobility for improved function in home.    Baseline currently still requiring Mod A with bed mobility, CGA for rolling R/L    Time 4    Period Weeks    Status On-going    Target Date 05/08/20      PT SHORT TERM GOAL #3   Title Pt will be able to perform sit to stand from wheelchair mod assist for improved function/functional strength at home.    Baseline max assist +2    Time 4    Period Weeks    Status On-going    Target Date 05/08/20      PT SHORT TERM GOAL #4   Title Pt will ambulate 54' with RW min assist for improved household mobility.    Baseline ambualte 54' with with Min A to Mod A with RW    Time 4    Period Weeks    Status On-going    Target Date 05/08/20             PT Long Term Goals - 06/14/20 1256      PT LONG TERM GOAL #1   Title Pt will be to perform progressive HEP with wife assist to continue gains on own.    Baseline no current HEP    Time 10    Period Weeks    Status New    Target Date 07/05/20      PT LONG TERM GOAL #2   Title Pt will be able to perform transfers CGA for improved functional strength and mobility.    Baseline max assist +2  Time 10    Period Weeks    Status New    Target Date 07/05/20      PT LONG TERM GOAL #3   Title Pt will be able to  ambulate >100' CGA with RW for further improvement in household mobility.    Baseline 2' min assist to transfer mat to w/c with RW    Time 10    Period Weeks    Status New    Target Date 07/05/20      PT LONG TERM GOAL #4   Title Pt will be able to maintain standing x 5 min with supervision with light UE support on walker or counter for improved balance and participation with ADLs.    Baseline CGA/Min assist to stand at [facility] briefly    Time 10    Period Weeks    Status New    Target Date 07/05/20                 Plan - 06/14/20 1256    Clinical Impression Statement Pt continues to need max +2 assist to rise from w/c. Doing well from elevated mat. Able to increase gait distance some today but right knee was bothering him more. PT updated target dates on LTGs.    Personal Factors and Comorbidities Comorbidity 3+    Comorbidities hypertension, tobacco abuse, CAD with non-STEMI 04/2018 as well as left ICA occlusion July 2020    Examination-Activity Limitations Bathing;Hygiene/Grooming;Stairs;Bed Mobility;Locomotion Level;Stand;Toileting;Transfers    Examination-Participation Restrictions Community Activity    Stability/Clinical Decision Making Evolving/Moderate complexity    Rehab Potential Good    PT Frequency 1x / week    PT Duration --   10 weeks plus eval   PT Treatment/Interventions ADLs/Self Care Home Management;Moist Heat;Electrical Stimulation;Gait training;DME Instruction;Functional mobility training;Stair training;Neuromuscular re-education;Balance training;Therapeutic exercise;Therapeutic activities;Patient/family education;Orthotic Fit/Training;Vestibular;Passive range of motion;Manual techniques;Wheelchair mobility training    PT Next Visit Plan Any news on w/c? Waiting on [employer] to get replacement in. Add 1 more visit on the week after last scheduled.  continue gait, sit <> stand training, seated/supine strengthening.    Consulted and Agree with Plan of Care Patient             Patient will benefit from skilled therapeutic intervention in order to improve the following deficits and impairments:  Abnormal gait, Decreased activity tolerance, Decreased balance, Decreased endurance, Decreased mobility, Decreased knowledge of use of DME, Decreased range of motion, Decreased strength, Impaired UE functional use, Pain  Visit Diagnosis: Other abnormalities of gait and mobility  Muscle weakness (generalized)  Hemiplegia and hemiparesis following cerebral infarction affecting right non-dominant side [facility])     Problem List Patient Active Problem List   Diagnosis Date Noted  . Acute ischemic left middle cerebral artery (MCA) stroke (HCC) 05/11/2020  . Cerebral infarction, watershed distribution, unilateral, acute (HCC) 05/11/2020  . Dysarthria as late effect of cerebrovascular accident (CVA) 05/11/2020  . Visual field constriction, bilateral 05/11/2020  . Hemiparesis affecting right side as late effect of cerebrovascular accident (HCC) 05/11/2020  . Dysarthria, post-stroke 04/07/2020  . 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  . Hyperglycemia   . Anemia   . Acute pain of right knee   . Primary osteoarthritis of right knee   . Expressive language impairment   . Benign essential HTN   . Acute bilat watershed infarction [facility]) 03/16/2020  . Cerebral infarction, watershed distribution, unilateral, chronic 03/16/2020  . Acute renal failure (HCC)   . History of CVA in adulthood   .  Dyslipidemia   . CVA (cerebral vascular accident) (HCC) 03/11/2020  . Leukocytosis   . Hemiparesis affecting dominant side as late effect of stroke (HCC)   . Labile blood pressure   . Sleep disturbance   . Benign hypertensive heart and kidney disease with diastolic CHF, NYHA class II and CKD stage III (HCC)   . Chronic systolic congestive heart failure (HCC)   . Morbid obesity (HCC)   . Uncontrolled hypertension   . Dysphagia, post-stroke   . Cardiomegaly   . Acute systolic  congestive heart failure (HCC)   . 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 Cerebral embolism with cerebral infarction 07/11/2019  . Acute CVA (cerebrovascular accident) (HCC) 07/11/2019  . Slurred speech 07/10/2019  . Cardiomyopathy (HCC) 11/20/2018  . History of stroke 09/06/2018  . Tobacco use 09/06/2018  . History of non-ST elevation myocardial infarction (NSTEMI) 05/05/2018  . Severe Vitamin D deficiency 04/02/2018  . Community acquired pneumonia of left lower lobe of lung 04/02/2018  . CKD (chronic kidney disease), stage III (HCC) = Secondary to Hypertension 04/02/2018  . Pneumonia 03/30/2018  . Metabolic acidosis 03/30/2018  . AKI (acute kidney injury) (HCC) 03/30/2018  . N&V (nausea and vomiting) 03/30/2018  . Essential hypertension 03/30/2018    Ronn Melena, PT, DPT, NCS 06/14/2020, 12:59 PM  [facility] [address] [place], [place], [address] Phone: [phone]   Fax:  [phone]  Name: KELIN BORUM MRN: [id number] Date of Birth: 06/10/1971

## 2020-06-16 ENCOUNTER — Encounter: Payer: Medicaid Other | Admitting: Speech Pathology

## 2020-06-16 ENCOUNTER — Encounter: Payer: Medicaid Other | Admitting: Occupational Therapy

## 2020-06-16 ENCOUNTER — Ambulatory Visit: Payer: Medicaid Other

## 2020-06-22 ENCOUNTER — Encounter: Payer: Self-pay | Admitting: Occupational Therapy

## 2020-06-22 ENCOUNTER — Ambulatory Visit: Payer: Medicaid Other | Attending: Nurse Practitioner

## 2020-06-22 ENCOUNTER — Other Ambulatory Visit: Payer: Self-pay

## 2020-06-22 ENCOUNTER — Ambulatory Visit: Payer: Medicaid Other | Admitting: Occupational Therapy

## 2020-06-22 DIAGNOSIS — M6281 Muscle weakness (generalized): Secondary | ICD-10-CM

## 2020-06-22 DIAGNOSIS — I69353 Hemiplegia and hemiparesis following cerebral infarction affecting right non-dominant side: Secondary | ICD-10-CM

## 2020-06-22 DIAGNOSIS — R2689 Other abnormalities of gait and mobility: Secondary | ICD-10-CM | POA: Diagnosis not present

## 2020-06-22 DIAGNOSIS — R2681 Unsteadiness on feet: Secondary | ICD-10-CM

## 2020-06-22 DIAGNOSIS — R278 Other lack of coordination: Secondary | ICD-10-CM | POA: Diagnosis not present

## 2020-06-22 NOTE — Therapy (Signed)
[facility] [address] [place], [place], [address] Phone: [phone]   Fax:  [phone]  Physical Therapy Treatment  Patient Details  Name: Zachary Mccann MRN: [id number] Date of Birth: [birth year] Referring Provider (PT): Referred by Mariam Dollar but neurologist is Maryla Morrow   Encounter Date: 06/22/2020   PT End of Session - 06/22/20 0936    Visit Number 9    Number of Visits 11    Date for PT Re-Evaluation 06/07/20    Authorization Type Medicaid 4/28-5/18 3 visits, 8 Visits 5/20 -  7/14    Authorization - Visit Number 6    Authorization - Number of Visits 8    PT Start Time 0932    PT Stop Time 1015    PT Time Calculation (min) 43 min    Equipment Utilized During Treatment Gait belt    Activity Tolerance Patient tolerated treatment well    Behavior During Therapy [facility] for tasks assessed/performed           Past Medical History:  Diagnosis Date  . CAD (coronary artery disease)    a. s/p NSTEMI in 04/2018 with DES to LCx.   [name] Hypertension   . Myocardial infarction (HCC)   . Pneumonia   . Stroke [facility])     Past Surgical History:  Procedure Laterality Date  . CORONARY STENT INTERVENTION N/A 05/05/2018   Procedure: CORONARY STENT INTERVENTION;  Surgeon: Marykay Lex, MD;  Location: [facility] INVASIVE CV LAB;  Service: Cardiovascular;  Laterality: N/A;  . LEFT HEART CATH AND CORONARY ANGIOGRAPHY N/A 05/05/2018   Procedure: LEFT HEART CATH AND CORONARY ANGIOGRAPHY;  Surgeon: Marykay Lex, MD;  Location: [facility] INVASIVE CV LAB;  Service: Cardiovascular;  Laterality: N/A;  . TEE WITHOUT CARDIOVERSION N/A 09/08/2018   Procedure: TRANSESOPHAGEAL ECHOCARDIOGRAM (TEE);  Surgeon: Laurey Morale, MD;  Location: [facility] ENDOSCOPY;  Service: Cardiovascular;  Laterality: N/A;    There were no vitals filed for this visit.   Subjective Assessment - 06/22/20 0935    Subjective Patient has still not recieved his w/c from  [facility]. No pain and no falls to report. Reports doing well since last visit.    Currently in Pain? No/denies    Pain Onset More than a month ago   reports started this morning                            [facility] Adult PT Treatment/Exercise - 06/22/20 0001      Transfers   Transfers Sit to Stand;Stand to Sit    Sit to Stand 2: Max assist    Sit to Stand Details Verbal cues for technique    Sit to Stand Details (indicate cue type and reason) with sit <> stand from w/c patient requires Max A +2 to rise, patient demo posterior LOB requiring controlled lowering to chair. Patient completed with R hand on walker, and L pushing from w/c. Patient require increased verbal cues for posture/alignment today with completion.     Stand to Sit 4: Min assist    Stand to Sit Details (indicate cue type and reason) Verbal cues for technique;Manual facilitation for weight shifting    Stand Pivot Transfers 4: Min assist    Stand Pivot Transfer Details (indicate cue type and reason) completed from mat <> w/c with bariatric RW    Comments Completed sit <> stand training from elevated mat, 2 x 3 reps at 22 inches  with Min A. Pt demo increased difficulty with sit <> stands in today's session with patient reporting legs feel weak. Pt require less assistance with increased repetition. PT providing manual faciliation for weight shift at pelvis.       Ambulation/Gait   Ambulation/Gait Yes    Ambulation/Gait Assistance 4: Min assist    Ambulation/Gait Assistance Details Completed gait training 1 x 70 ft, 1 x 48 ft with close w/c follow. Pt was cued to stay close to AD with ambulation.     Ambulation Distance (Feet) 70 Feet   1 x 48   Assistive device Rolling walker   bariatric    Gait Pattern Step-through pattern;Step-to pattern;Decreased stance time - right;Decreased dorsiflexion - right;Decreased hip/knee flexion - right;Trunk rotated posteriorly on right    Ambulation Surface Level;Indoor      Neuro  Re-ed    Neuro Re-ed Details  Standing at bariatic RW, completed alternating forward stepping L foot forward and back x 10 reps. CGA/Min A for steadying. Increased shakiness and fatigue near the end of completion. Completed alternating marching with focus on maintaining static seated balance with proper posture, 1 x 10 reps alternating.       Exercises   Exercises Other Exercises    Other Exercises  Completed seated assisted hamstring curls with foam roll placed under RLE, completed 1 x 10 reps.                     PT Short Term Goals - 05/10/20 1732      PT SHORT TERM GOAL #1   Title Pt will be independent with initial HEP for strengthening, flexibility to continue with wife's assistance.    Baseline HEP initiated    Time 4    Period Weeks    Status On-going    Target Date 05/08/20      PT SHORT TERM GOAL #2   Title Pt will be CGA with bed mobility for improved function in home.    Baseline currently still requiring Mod A with bed mobility, CGA for rolling R/L    Time 4    Period Weeks    Status On-going    Target Date 05/08/20      PT SHORT TERM GOAL #3   Title Pt will be able to perform sit to stand from wheelchair mod assist for improved function/functional strength at home.    Baseline max assist +2    Time 4    Period Weeks    Status On-going    Target Date 05/08/20      PT SHORT TERM GOAL #4   Title Pt will ambulate 33' with RW min assist for improved household mobility.    Baseline ambualte 43' with with Min A to Mod A with RW    Time 4    Period Weeks    Status On-going    Target Date 05/08/20             PT Long Term Goals - 06/14/20 1256      PT LONG TERM GOAL #1   Title Pt will be to perform progressive HEP with wife assist to continue gains on own.    Baseline no current HEP    Time 10    Period Weeks    Status New    Target Date 07/05/20      PT LONG TERM GOAL #2   Title Pt will be able to perform transfers CGA for improved functional  strength  and mobility.    Baseline max assist +2    Time 10    Period Weeks    Status New    Target Date 07/05/20      PT LONG TERM GOAL #3   Title Pt will be able to ambulate >100' CGA with RW for further improvement in household mobility.    Baseline 2' min assist to transfer mat to w/c with RW    Time 10    Period Weeks    Status New    Target Date 07/05/20      PT LONG TERM GOAL #4   Title Pt will be able to maintain standing x 5 min with supervision with light UE support on walker or counter for improved balance and participation with ADLs.    Baseline CGA/Min assist to stand at [facility] briefly    Time 10    Period Weeks    Status New    Target Date 07/05/20                 Plan - 06/22/20 1114    Clinical Impression Statement Continued gait training with Bariatric RW today, with patient able continue to ambulate with Min A. Patient require Max A + 2 still from w/c. Frequent rest breaks needed to BLE fatigue reported by patient. Patient has not recieved new w/c at this time. Patient will continue to benefit from skilled PT services to progress toward all goals.    Personal Factors and Comorbidities Comorbidity 3+    Comorbidities hypertension, tobacco abuse, CAD with non-STEMI 04/2018 as well as left ICA occlusion July 2020    Examination-Activity Limitations Bathing;Hygiene/Grooming;Stairs;Bed Mobility;Locomotion Level;Stand;Toileting;Transfers    Examination-Participation Restrictions Community Activity    Stability/Clinical Decision Making Evolving/Moderate complexity    Rehab Potential Good    PT Frequency 1x / week    PT Duration --   10 weeks plus eval   PT Treatment/Interventions ADLs/Self Care Home Management;Moist Heat;Electrical Stimulation;Gait training;DME Instruction;Functional mobility training;Stair training;Neuromuscular re-education;Balance training;Therapeutic exercise;Therapeutic activities;Patient/family education;Orthotic Fit/Training;Vestibular;Passive  range of motion;Manual techniques;Wheelchair mobility training    PT Next Visit Plan Recieved w/c? (still waiting on [facility] to get replacement in, wife reports they are on waitlist).  Check Goals + resubmit for Medicaid. Continue gait, sit <> stand training, seated/supine strengthening.    Consulted and Agree with Plan of Care Patient           Patient will benefit from skilled therapeutic intervention in order to improve the following deficits and impairments:  Abnormal gait, Decreased activity tolerance, Decreased balance, Decreased endurance, Decreased mobility, Decreased knowledge of use of DME, Decreased range of motion, Decreased strength, Impaired UE functional use, Pain  Visit Diagnosis: Hemiplegia and hemiparesis following cerebral infarction affecting right non-dominant side (HCC)  Muscle weakness (generalized)  Unsteadiness on feet  Other lack of coordination  Other abnormalities of gait and mobility     Problem List Patient Active Problem List   Diagnosis Date Noted  . Acute ischemic left middle cerebral artery (MCA) stroke (HCC) 05/11/2020  . Cerebral infarction, watershed distribution, unilateral, acute (HCC) 05/11/2020  . Dysarthria as late effect of cerebrovascular accident (CVA) 05/11/2020  . Visual field constriction, bilateral 05/11/2020  . Hemiparesis affecting right side as late effect of cerebrovascular accident (HCC) 05/11/2020  . Dysarthria, post-stroke 04/07/2020  . 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  . Hyperglycemia   . Anemia   . Acute pain of right knee   . Primary osteoarthritis of right knee   . Expressive  language impairment   . Benign essential HTN   . Acute bilat watershed infarction [facility]) 03/16/2020  . Cerebral infarction, watershed distribution, unilateral, chronic 03/16/2020  . Acute renal failure (HCC)   . History of CVA in adulthood   . Dyslipidemia   . CVA (cerebral vascular accident) (HCC) 03/11/2020  . Leukocytosis   .  Hemiparesis affecting dominant side as late effect of stroke (HCC)   . Labile blood pressure   . Sleep disturbance   . Benign hypertensive heart and kidney disease with diastolic CHF, NYHA class II and CKD stage III (HCC)   . Chronic systolic congestive heart failure (HCC)   . Morbid obesity (HCC)   . Uncontrolled hypertension   . Dysphagia, post-stroke   . Cardiomegaly   . Acute systolic congestive heart failure (HCC)   . 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 Cerebral embolism with cerebral infarction 07/11/2019  . Acute CVA (cerebrovascular accident) (HCC) 07/11/2019  . Slurred speech 07/10/2019  . Cardiomyopathy (HCC) 11/20/2018  . History of stroke 09/06/2018  . Tobacco use 09/06/2018  . History of non-ST elevation myocardial infarction (NSTEMI) 05/05/2018  . Severe Vitamin D deficiency 04/02/2018  . Community acquired pneumonia of left lower lobe of lung 04/02/2018  . CKD (chronic kidney disease), stage III (HCC) = Secondary to Hypertension 04/02/2018  . Pneumonia 03/30/2018  . Metabolic acidosis 03/30/2018  . AKI (acute kidney injury) (HCC) 03/30/2018  . N&V (nausea and vomiting) 03/30/2018  . Essential hypertension 03/30/2018    Tempie Donning, PT, DPT 06/22/2020, 11:18 AM  [facility] [address] [place], [place], [address] Phone: [phone]   Fax:  [phone]  Name: HAKEEN SHIPES MRN: [id number] Date of Birth: 06-Feb-1971

## 2020-06-22 NOTE — Therapy (Signed)
[facility] [address] [place], [place], [address] Phone: [phone]   Fax:  [phone]  Occupational Therapy Treatment  Patient Details  Name: Zachary Mccann MRN: [id number] Date of Birth: 06-Jun-1971 No data recorded  Encounter Date: 06/22/2020   OT End of Session - 06/22/20 0858    Visit Number 7    Number of Visits 11    Date for OT Re-Evaluation 06/28/20    Authorization Type MCD    Authorization Time Period Approved 10 visits from 5/13 - 07/06/20    Authorization - Visit Number 7    Authorization - Number of Visits 10    OT Start Time [id number]    OT Stop Time 0930    OT Time Calculation (min) 39 min    Activity Tolerance Patient tolerated treatment well    Behavior During Therapy [facility] for tasks assessed/performed           Past Medical History:  Diagnosis Date  . CAD (coronary artery disease)    a. s/p NSTEMI in 04/2018 with DES to LCx.   [name] Hypertension   . Myocardial infarction (HCC)   . Pneumonia   . Stroke [facility])     Past Surgical History:  Procedure Laterality Date  . CORONARY STENT INTERVENTION N/A 05/05/2018   Procedure: CORONARY STENT INTERVENTION;  Surgeon: Marykay Lex, MD;  Location: [facility] INVASIVE CV LAB;  Service: Cardiovascular;  Laterality: N/A;  . LEFT HEART CATH AND CORONARY ANGIOGRAPHY N/A 05/05/2018   Procedure: LEFT HEART CATH AND CORONARY ANGIOGRAPHY;  Surgeon: Marykay Lex, MD;  Location: [facility] INVASIVE CV LAB;  Service: Cardiovascular;  Laterality: N/A;  . TEE WITHOUT CARDIOVERSION N/A 09/08/2018   Procedure: TRANSESOPHAGEAL ECHOCARDIOGRAM (TEE);  Surgeon: Laurey Morale, MD;  Location: [facility] ENDOSCOPY;  Service: Cardiovascular;  Laterality: N/A;    There were no vitals filed for this visit.   Subjective Assessment - 06/22/20 0857    Subjective  Denies pain    Pertinent History New CVA 03/11/20 w/ increased weakness on Rt side. Lt CVA w/ Rt hemiparesis July 2020, HTN, CAD, MI, s/p  NSTEMI May 2019.    Limitations assist x 2 to stand and transfer, fall risk, apraxic    Currently in Pain? No/denies                   Treatment: Flipping and dealing playing cards with RUE mod-max v.c/ facilitation due to apraxia, Seated then standing low to midrange functional reaching to place graded clothespins on vertical antennae, pt required max A+2 for sit to stand, when standing minquard(+2 for safety) Placing medium pegs into pegboard with RUE for increased fine motor coordination, min difficulty/ v.c              OT Short Term Goals - 06/14/20 1116      OT SHORT TERM GOAL #1   Title Independent with HEP    Baseline dependent, not yet issued    Time 5    Period Weeks    Status Achieved      OT SHORT TERM GOAL #2   Title Pt to perform 50* or greater shoulder flexion RUE for low level reaching/assist w/ ADLS    Baseline 30*    Time 5    Period Weeks    Status On-going   06/14/20: 40-45* sh flexion with elbow flexed     OT SHORT TERM GOAL #3   Title Pt to perform UE dressing I'ly  Baseline mod assist    Time 5    Period Weeks    Status On-going   Pt with min assist to don, able to doff I'ly     OT SHORT TERM GOAL #4   Title Pt to perform transfer to [facility] with max assist x 1    Baseline total dependence    Time 5    Period Weeks    Status Achieved      OT SHORT TERM GOAL #5   Title Pt to improve RUE function as evidenced by performing 15 blocks on Box & Blocks test    Baseline Rt = 9    Time 5    Period Weeks    Status On-going   06/14/20: 11 blocks            OT Long Term Goals - 05/25/20 1126      OT LONG TERM GOAL #1   Title Pt to perform LE bathing and dressing with mod assist (max assist to stand to pull up over hips) with A/E prn    Baseline max assist to total dependence    Time 10    Period Weeks    Status New      OT LONG TERM GOAL #2   Title Pt to perform level transfers with mod assist    Baseline max assist x 2, or  total dependence    Time 10    Period Weeks    Status New      OT LONG TERM GOAL #3   Title Pt to perform RUE shoulder flexion to 70* or greater for consistent low level reaching    Baseline 30*    Time 10    Period Weeks    Status New      OT LONG TERM GOAL #4   Title Grip strength Rt hand to be 20 lbs or greater to assist with opening containers    Baseline 10 lbs    Time 10    Period Weeks    Status New      OT LONG TERM GOAL #5   Title Pt to make simple snack from w/c level, standing at counter to retrieve items from higher shelves    Baseline dependent - pt only getting snack from w/c level    Time 10    Period Weeks    Status New                 Plan - 06/22/20 7408    Clinical Impression Statement Pt is progressing towards goals. He reports performing more of his own self care.    Occupational performance deficits (Please refer to evaluation for details): ADL's;IADL's;Social Participation    Body Structure / Function / Physical Skills ADL;ROM;IADL;Body mechanics;Endurance;Sensation;Mobility;Flexibility;Strength;Coordination;FMC;Obesity;UE functional use;Decreased knowledge of precautions;Decreased knowledge of use of DME    Rehab Potential Fair    OT Frequency 1x / week    OT Duration --   10 weeks   OT Treatment/Interventions Self-care/ADL training;Therapeutic exercise;Functional Mobility Training;Neuromuscular education;Splinting;Manual Therapy;Therapeutic activities;Coping strategies training;DME and/or AE instruction;Cognitive remediation/compensation;Visual/perceptual remediation/compensation;Passive range of motion;Patient/family education    Plan continue NMR RUE, functional low level reaching, and transfers    Consulted and Agree with Plan of Care Patient           Patient will benefit from skilled therapeutic intervention in order to improve the following deficits and impairments:   Body Structure / Function / Physical Skills: ADL, ROM, IADL, Body  mechanics, Endurance, Sensation,  Mobility, Flexibility, Strength, Coordination, FMC, Obesity, UE functional use, Decreased knowledge of precautions, Decreased knowledge of use of DME       Visit Diagnosis: Hemiplegia and hemiparesis following cerebral infarction affecting right non-dominant side (HCC)  Muscle weakness (generalized)  Unsteadiness on feet  Other lack of coordination  Other abnormalities of gait and mobility    Problem List Patient Active Problem List   Diagnosis Date Noted  . Acute ischemic left middle cerebral artery (MCA) stroke (HCC) 05/11/2020  . Cerebral infarction, watershed distribution, unilateral, acute (HCC) 05/11/2020  . Dysarthria as late effect of cerebrovascular accident (CVA) 05/11/2020  . Visual field constriction, bilateral 05/11/2020  . Hemiparesis affecting right side as late effect of cerebrovascular accident (HCC) 05/11/2020  . Dysarthria, post-stroke 04/07/2020  . 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  . Hyperglycemia   . Anemia   . Acute pain of right knee   . Primary osteoarthritis of right knee   . Expressive language impairment   . Benign essential HTN   . Acute bilat watershed infarction [facility]) 03/16/2020  . Cerebral infarction, watershed distribution, unilateral, chronic 03/16/2020  . Acute renal failure (HCC)   . History of CVA in adulthood   . Dyslipidemia   . CVA (cerebral vascular accident) (HCC) 03/11/2020  . Leukocytosis   . Hemiparesis affecting dominant side as late effect of stroke (HCC)   . Labile blood pressure   . Sleep disturbance   . Benign hypertensive heart and kidney disease with diastolic CHF, NYHA class II and CKD stage III (HCC)   . Chronic systolic congestive heart failure (HCC)   . Morbid obesity (HCC)   . Uncontrolled hypertension   . Dysphagia, post-stroke   . Cardiomegaly   . Acute systolic congestive heart failure (HCC)   . 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 Cerebral embolism with  cerebral infarction 07/11/2019  . Acute CVA (cerebrovascular accident) (HCC) 07/11/2019  . Slurred speech 07/10/2019  . Cardiomyopathy (HCC) 11/20/2018  . History of stroke 09/06/2018  . Tobacco use 09/06/2018  . History of non-ST elevation myocardial infarction (NSTEMI) 05/05/2018  . Severe Vitamin D deficiency 04/02/2018  . Community acquired pneumonia of left lower lobe of lung 04/02/2018  . CKD (chronic kidney disease), stage III (HCC) = Secondary to Hypertension 04/02/2018  . Pneumonia 03/30/2018  . Metabolic acidosis 03/30/2018  . AKI (acute kidney injury) (HCC) 03/30/2018  . N&V (nausea and vomiting) 03/30/2018  . Essential hypertension 03/30/2018    Adianna Darwin 06/22/2020, 10:27 AM  [facility] [address] [place], [place], [address] Phone: [phone]   Fax:  [phone]  Name: Zachary Mccann MRN: [id number] Date of Birth: Jan 13, 1971

## 2020-06-29 ENCOUNTER — Ambulatory Visit: Payer: Medicaid Other | Admitting: Occupational Therapy

## 2020-06-29 ENCOUNTER — Ambulatory Visit: Payer: Medicaid Other

## 2020-06-29 ENCOUNTER — Other Ambulatory Visit: Payer: Self-pay

## 2020-06-29 DIAGNOSIS — M6281 Muscle weakness (generalized): Secondary | ICD-10-CM | POA: Diagnosis not present

## 2020-06-29 DIAGNOSIS — I69353 Hemiplegia and hemiparesis following cerebral infarction affecting right non-dominant side: Secondary | ICD-10-CM

## 2020-06-29 DIAGNOSIS — R278 Other lack of coordination: Secondary | ICD-10-CM

## 2020-06-29 DIAGNOSIS — R2681 Unsteadiness on feet: Secondary | ICD-10-CM | POA: Diagnosis not present

## 2020-06-29 DIAGNOSIS — R2689 Other abnormalities of gait and mobility: Secondary | ICD-10-CM | POA: Diagnosis not present

## 2020-06-29 NOTE — Therapy (Signed)
[facility] [address] [place], [place], [address] Phone: [phone]   Fax:  [phone]  Occupational Therapy Treatment  Patient Details  Name: Zachary Mccann MRN: [id number] Date of Birth: [birth year] No data recorded  Encounter Date: 06/29/2020   OT End of Session - 06/29/20 1030    Visit Number 9    Number of Visits 11    Date for OT Re-Evaluation 07/06/20   extended d/t missed weeks (MCD approved through 07/06/20)   Authorization Type MCD    Authorization Time Period Approved 10 visits from 5/13 - 07/06/20    Authorization - Visit Number 8    Authorization - Number of Visits 10    OT Start Time 0935    OT Stop Time 1015    OT Time Calculation (min) 40 min    Activity Tolerance Patient tolerated treatment well    Behavior During Therapy [facility] for tasks assessed/performed           Past Medical History:  Diagnosis Date  . CAD (coronary artery disease)    a. s/p NSTEMI in 04/2018 with DES to LCx.   [name] Hypertension   . Myocardial infarction ([place])   . Pneumonia   . Stroke [facility])     Past Surgical History:  Procedure Laterality Date  . CORONARY STENT INTERVENTION N/A 05/05/2018   Procedure: CORONARY STENT INTERVENTION;  Surgeon: Leonie Man, MD;  Location: [place] [facility];  Service: Cardiovascular;  Laterality: N/A;  . LEFT HEART CATH AND CORONARY ANGIOGRAPHY N/A 05/05/2018   Procedure: LEFT HEART CATH AND CORONARY ANGIOGRAPHY;  Surgeon: Leonie Man, MD;  Location: [place] [facility];  Service: Cardiovascular;  Laterality: N/A;  . TEE WITHOUT CARDIOVERSION N/A 09/08/2018   Procedure: TRANSESOPHAGEAL ECHOCARDIOGRAM (TEE);  Surgeon: Larey Dresser, MD;  Location: [facility];  Service: Cardiovascular;  Laterality: N/A;    There were no vitals filed for this visit.   Subjective Assessment - 06/29/20 0943    Pertinent History New CVA 03/11/20 w/ increased weakness on Rt side. Lt CVA w/ Rt  hemiparesis July 2020, HTN, CAD, MI, s/p NSTEMI May 2019.    Limitations assist x 2 to stand and transfer, fall risk, apraxic    Currently in Pain? Yes    Pain Score 5     Pain Location Knee    Pain Orientation Right    Pain Descriptors / Indicators Aching    Pain Type Chronic pain    Pain Onset More than a month ago    Aggravating Factors  standing           Assessed goals and progress. Scoot pivot transfer to level surface towards weak side w/ max assist x 1 and towards strong side w/ mod assist x 1.  Functional low to mid level reaching to place 1" blocks in bowl, flip large cards over, and retrieve/replace cones with Rt hand. Emphasis placed on fully reaching around object, opening hand large prior to grasping and when releasing.                        OT Short Term Goals - 06/29/20 1032      OT SHORT TERM GOAL #1   Title Independent with HEP    Baseline dependent, not yet issued    Time 5    Period Weeks    Status Achieved      OT SHORT TERM GOAL #2  Title Pt to perform 50* or greater shoulder flexion RUE for low level reaching/assist w/ ADLS    Baseline 30*    Time 5    Period Weeks    Status Achieved   50*     OT SHORT TERM GOAL #3   Title Pt to perform UE dressing I'ly    Baseline mod assist    Time 5    Period Weeks    Status Achieved      OT SHORT TERM GOAL #4   Title Pt to perform transfer to [facility] with max assist x 1    Baseline total dependence    Time 5    Period Weeks    Status Achieved   level scoot pivot only     OT SHORT TERM GOAL #5   Title Pt to improve RUE function as evidenced by performing 15 blocks on Box & Blocks test    Baseline Rt = 9    Time 5    Period Weeks    Status On-going   06/14/20: 11 blocks            OT Long Term Goals - 06/29/20 1033      OT LONG TERM GOAL #1   Title Pt to perform LE bathing and dressing with mod assist (max assist to stand to pull up over hips) with A/E prn    Baseline max assist  to total dependence    Time 10    Period Weeks    Status Not Met   max assist     OT LONG TERM GOAL #2   Title Pt to perform level transfers with mod assist    Baseline max assist x 2, or total dependence    Time 10    Period Weeks    Status Not Met   max assist x 1 w/ scoot pivot transfers to weak side, mod assist to strong side (but still requires assist x 2 for sit to stand from low surface for stand pivot transfers)     OT LONG TERM GOAL #3   Title Pt to perform RUE shoulder flexion to 70* or greater for consistent low level reaching    Baseline 30*    Time 10    Period Weeks    Status Not Met   50*-55*     OT LONG TERM GOAL #4   Title Grip strength Rt hand to be 20 lbs or greater to assist with opening containers    Baseline 10 lbs    Time 10    Period Weeks    Status Not Met   15 lbs     OT LONG TERM GOAL #5   Title Pt to make simple snack from w/c level, standing at counter to retrieve items from higher shelves    Baseline dependent - pt only getting snack from w/c level    Time 10    Period Weeks    Status Not Met   has not attempted                Plan - 06/29/20 1035    Clinical Impression Statement Pt has currently met 4/5 STG's but no LTG's due to inconsistent carryover at home, decreased frequency in therapy (and limited visits), and severity of deficits.    Occupational performance deficits (Please refer to evaluation for details): ADL's;IADL's;Social Participation    Body Structure / Function / Physical Skills ADL;ROM;IADL;Body mechanics;Endurance;Sensation;Mobility;Flexibility;Strength;Coordination;FMC;Obesity;UE functional use;Decreased knowledge of precautions;Decreased knowledge of  use of DME    Rehab Potential Fair    Comorbidities Affecting Occupational Performance: Presence of comorbidities impacting occupational performance    Comorbidities impacting occupational performance description: residual Rt hemiparesis from 1st stroke made worse by  second stroke    OT Frequency 1x / week    OT Duration --   10 weeks   OT Treatment/Interventions Self-care/ADL training;Therapeutic exercise;Functional Mobility Training;Neuromuscular education;Splinting;Manual Therapy;Therapeutic activities;Coping strategies training;DME and/or AE instruction;Cognitive remediation/compensation;Visual/perceptual remediation/compensation;Passive range of motion;Patient/family education    Plan reassess remaining STG, continue functional low to mid level reaching RUE and level transfers, and D/C O.T. next session    Consulted and Agree with Plan of Care Patient           Patient will benefit from skilled therapeutic intervention in order to improve the following deficits and impairments:   Body Structure / Function / Physical Skills: ADL, ROM, IADL, Body mechanics, Endurance, Sensation, Mobility, Flexibility, Strength, Coordination, FMC, Obesity, UE functional use, Decreased knowledge of precautions, Decreased knowledge of use of DME       Visit Diagnosis: Hemiplegia and hemiparesis following cerebral infarction affecting right non-dominant side (HCC)  Other lack of coordination  Muscle weakness (generalized)    Problem List Patient Active Problem List   Diagnosis Date Noted  . Acute ischemic left middle cerebral artery (MCA) stroke ([place]) 05/11/2020  . Cerebral infarction, watershed distribution, unilateral, acute ([place]) 05/11/2020  . Dysarthria as late effect of cerebrovascular accident (CVA) 05/11/2020  . Visual field constriction, bilateral 05/11/2020  . Hemiparesis affecting right side as late effect of cerebrovascular accident ([place]) 05/11/2020  . Dysarthria, post-stroke 04/07/2020  . 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  . Hyperglycemia   . Anemia   . Acute pain of right knee   . Primary osteoarthritis of right knee   . Expressive language impairment   . Benign essential HTN   . Acute bilat watershed infarction [facility]) 03/16/2020  .  Cerebral infarction, watershed distribution, unilateral, chronic 03/16/2020  . Acute renal failure ([place])   . History of CVA in adulthood   . Dyslipidemia   . CVA (cerebral vascular accident) ([place]) 03/11/2020  . Leukocytosis   . Hemiparesis affecting dominant side as late effect of stroke ([place])   . Labile blood pressure   . Sleep disturbance   . Benign hypertensive heart and kidney disease with diastolic CHF, NYHA class II and CKD stage III ([place])   . Chronic systolic congestive heart failure ([place])   . Morbid obesity ([place])   . Uncontrolled hypertension   . Dysphagia, post-stroke   . Cardiomegaly   . Acute systolic congestive heart failure ([place])   . Left middle cerebral artery stroke ([place]) 07/17/2019  . Cerebral embolism with cerebral infarction 07/11/2019  . Acute CVA (cerebrovascular accident) ([place]) 07/11/2019  . Slurred speech 07/10/2019  . Cardiomyopathy ([place]) 11/20/2018  . History of stroke 09/06/2018  . Tobacco use 09/06/2018  . History of non-ST elevation myocardial infarction (NSTEMI) 05/05/2018  . Severe Vitamin D deficiency 04/02/2018  . Community acquired pneumonia of left lower lobe of lung 04/02/2018  . CKD (chronic kidney disease), stage III ([place]) = Secondary to Hypertension 04/02/2018  . Pneumonia 03/30/2018  . Metabolic acidosis 62/22/9798  . AKI (acute kidney injury) ([place]) 03/30/2018  . N&V (nausea and vomiting) 03/30/2018  . Essential hypertension 03/30/2018    Carey Bullocks, OTR/L 06/29/2020, 10:41 AM  [facility] [address] [place], [place], [address] Phone: [phone]   Fax:  [phone]  Name: Zachary Mccann MRN: [id number] Date of Birth: [birth year]

## 2020-07-05 ENCOUNTER — Other Ambulatory Visit: Payer: Self-pay

## 2020-07-05 ENCOUNTER — Encounter: Payer: Self-pay | Admitting: Occupational Therapy

## 2020-07-05 ENCOUNTER — Ambulatory Visit: Payer: Medicaid Other | Admitting: Occupational Therapy

## 2020-07-05 DIAGNOSIS — M6281 Muscle weakness (generalized): Secondary | ICD-10-CM | POA: Diagnosis not present

## 2020-07-05 DIAGNOSIS — R2681 Unsteadiness on feet: Secondary | ICD-10-CM

## 2020-07-05 DIAGNOSIS — I69353 Hemiplegia and hemiparesis following cerebral infarction affecting right non-dominant side: Secondary | ICD-10-CM

## 2020-07-05 DIAGNOSIS — R278 Other lack of coordination: Secondary | ICD-10-CM | POA: Diagnosis not present

## 2020-07-05 DIAGNOSIS — R2689 Other abnormalities of gait and mobility: Secondary | ICD-10-CM | POA: Diagnosis not present

## 2020-07-05 NOTE — Therapy (Signed)
[facility] [address] [place], [place], [address] Phone: [phone]   Fax:  [phone]  Occupational Therapy Treatment  Patient Details  Name: WARDEN BUFFA MRN: [id number] Date of Birth: 06/18/1971 No data recorded  Encounter Date: 07/05/2020   OT End of Session - 07/05/20 1524    Visit Number 10    Number of Visits 11    Date for OT Re-Evaluation 07/06/20   extended d/t missed weeks (MCD approved through 07/06/20)   Authorization Type MCD    Authorization Time Period Approved 10 visits from 5/13 - 07/06/20    Authorization - Visit Number 9    Authorization - Number of Visits 10    OT Start Time 0848    OT Stop Time 0928    OT Time Calculation (min) 40 min    Activity Tolerance Patient tolerated treatment well    Behavior During Therapy [facility] for tasks assessed/performed           Past Medical History:  Diagnosis Date  . CAD (coronary artery disease)    a. s/p NSTEMI in 04/2018 with DES to LCx.   [name] Hypertension   . Myocardial infarction ([place])   . Pneumonia   . Stroke [facility])     Past Surgical History:  Procedure Laterality Date  . CORONARY STENT INTERVENTION N/A 05/05/2018   Procedure: CORONARY STENT INTERVENTION;  Surgeon: Leonie Man, MD;  Location: [place] [facility];  Service: Cardiovascular;  Laterality: N/A;  . LEFT HEART CATH AND CORONARY ANGIOGRAPHY N/A 05/05/2018   Procedure: LEFT HEART CATH AND CORONARY ANGIOGRAPHY;  Surgeon: Leonie Man, MD;  Location: [place] [facility];  Service: Cardiovascular;  Laterality: N/A;  . TEE WITHOUT CARDIOVERSION N/A 09/08/2018   Procedure: TRANSESOPHAGEAL ECHOCARDIOGRAM (TEE);  Surgeon: Larey Dresser, MD;  Location: [facility] ENDOSCOPY;  Service: Cardiovascular;  Laterality: N/A;    There were no vitals filed for this visit.   Subjective Assessment - 07/05/20 0853    Subjective  Denies pain    Pertinent History New CVA 03/11/20 w/ increased weakness on Rt  side. Lt CVA w/ Rt hemiparesis July 2020, HTN, CAD, MI, s/p NSTEMI May 2019.    Limitations assist x 2 to stand and transfer, fall risk, apraxic    Currently in Pain? No/denies    Pain Onset More than a month ago                Treatment: Therapist checked remaining short term goal. Pt transferred w/c to mat max A level transfer to level surface, +2 assist for safety. Cane exercises for chest press and shoulder flexion, low range, mod facilitation/ v.c Removing then replacing wooden dowel pegs with RUE for low range functional reach, min difficulty Low to midrange functional reach for graded clothespins, min v.c                  OT Short Term Goals - 07/05/20 0902      OT SHORT TERM GOAL #1   Title Independent with HEP    Baseline dependent, not yet issued    Time 5    Period Weeks    Status Achieved      OT SHORT TERM GOAL #2   Title Pt to perform 50* or greater shoulder flexion RUE for low level reaching/assist w/ ADLS    Baseline 30*    Time 5    Period Weeks    Status Achieved   50*  OT SHORT TERM GOAL #3   Title Pt to perform UE dressing I'ly    Baseline mod assist    Time 5    Period Weeks    Status Achieved      OT SHORT TERM GOAL #4   Title Pt to perform transfer to [facility] with max assist x 1    Baseline total dependence    Time 5    Period Weeks    Status Achieved   level scoot pivot only     OT SHORT TERM GOAL #5   Title Pt to improve RUE function as evidenced by performing 15 blocks on Box & Blocks test    Baseline Rt = 9    Time 5    Period Weeks    Status Achieved   17 blocks -07/05/20            OT Long Term Goals - 06/29/20 1033      OT LONG TERM GOAL #1   Title Pt to perform LE bathing and dressing with mod assist (max assist to stand to pull up over hips) with A/E prn    Baseline max assist to total dependence    Time 10    Period Weeks    Status Not Met   max assist     OT LONG TERM GOAL #2   Title Pt to perform  level transfers with mod assist    Baseline max assist x 2, or total dependence    Time 10    Period Weeks    Status Not Met   max assist x 1 w/ scoot pivot transfers to weak side, mod assist to strong side (but still requires assist x 2 for sit to stand from low surface for stand pivot transfers)     OT LONG TERM GOAL #3   Title Pt to perform RUE shoulder flexion to 70* or greater for consistent low level reaching    Baseline 30*    Time 10    Period Weeks    Status Not Met   50*-55*     OT LONG TERM GOAL #4   Title Grip strength Rt hand to be 20 lbs or greater to assist with opening containers    Baseline 10 lbs    Time 10    Period Weeks    Status Not Met   15 lbs     OT LONG TERM GOAL #5   Title Pt to make simple snack from w/c level, standing at counter to retrieve items from higher shelves    Baseline dependent - pt only getting snack from w/c level    Time 10    Period Weeks    Status Not Met   has not attempted                Plan - 07/05/20 0910    Clinical Impression Statement Pt has currently met 5/5 STG's but no LTG's due to inconsistent carryover at home, decreased frequency in therapy (and limited visits), and severity of deficits. Pt agrees with plans for doischarge from OT.    Occupational performance deficits (Please refer to evaluation for details): ADL's;IADL's;Social Participation    Body Structure / Function / Physical Skills ADL;ROM;IADL;Body mechanics;Endurance;Sensation;Mobility;Flexibility;Strength;Coordination;FMC;Obesity;UE functional use;Decreased knowledge of precautions;Decreased knowledge of use of DME    Rehab Potential Fair    Comorbidities Affecting Occupational Performance: Presence of comorbidities impacting occupational performance    Comorbidities impacting occupational performance description: residual Rt hemiparesis from 4st  stroke made worse by second stroke    OT Frequency 1x / week    OT Duration --   10 weeks   OT  Treatment/Interventions Self-care/ADL training;Therapeutic exercise;Functional Mobility Training;Neuromuscular education;Splinting;Manual Therapy;Therapeutic activities;Coping strategies training;DME and/or AE instruction;Cognitive remediation/compensation;Visual/perceptual remediation/compensation;Passive range of motion;Patient/family education    Plan d/c OT    Consulted and Agree with Plan of Care Patient           Patient will benefit from skilled therapeutic intervention in order to improve the following deficits and impairments:   Body Structure / Function / Physical Skills: ADL, ROM, IADL, Body mechanics, Endurance, Sensation, Mobility, Flexibility, Strength, Coordination, FMC, Obesity, UE functional use, Decreased knowledge of precautions, Decreased knowledge of use of DME     OCCUPATIONAL THERAPY DISCHARGE SUMMARY    Current functional level related to goals / functional outcomes:  Pt met short term goals, but did not meet long term goals due to severity of deficits, limited carryover and limited visits. Remaining deficits: Decreased strength, decreased coordination, decreased balance, decreased functional mobility, cognitive deficits   Education / Equipment: Pt was instructed in HEP. He demonstrates understanding. Plan: Patient agrees to discharge.  Patient goals were partially met.                                                                                                      ?????Pt is being d/c due to slow progress and insurance end date for OT achieved.      Visit Diagnosis: Hemiplegia and hemiparesis following cerebral infarction affecting right non-dominant side (HCC)  Other lack of coordination  Muscle weakness (generalized)  Unsteadiness on feet  Other abnormalities of gait and mobility    Problem List Patient Active Problem List   Diagnosis Date Noted  . Acute ischemic left middle cerebral artery (MCA) stroke ([place]) 05/11/2020  . Cerebral  infarction, watershed distribution, unilateral, acute ([place]) 05/11/2020  . Dysarthria as late effect of cerebrovascular accident (CVA) 05/11/2020  . Visual field constriction, bilateral 05/11/2020  . Hemiparesis affecting right side as late effect of cerebrovascular accident ([place]) 05/11/2020  . Dysarthria, post-stroke 04/07/2020  . 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  . Hyperglycemia   . Anemia   . Acute pain of right knee   . Primary osteoarthritis of right knee   . Expressive language impairment   . Benign essential HTN   . Acute bilat watershed infarction [facility]) 03/16/2020  . Cerebral infarction, watershed distribution, unilateral, chronic 03/16/2020  . Acute renal failure ([place])   . History of CVA in adulthood   . Dyslipidemia   . CVA (cerebral vascular accident) ([place]) 03/11/2020  . Leukocytosis   . Hemiparesis affecting dominant side as late effect of stroke ([place])   . Labile blood pressure   . Sleep disturbance   . Benign hypertensive heart and kidney disease with diastolic CHF, NYHA class II and CKD stage III ([place])   . Chronic systolic congestive heart failure ([place])   . Morbid obesity ([place])   . Uncontrolled hypertension   . Dysphagia, post-stroke   . Cardiomegaly   . Acute  systolic congestive heart failure ([place])   . Left middle cerebral artery stroke ([place]) 07/17/2019  . Cerebral embolism with cerebral infarction 07/11/2019  . Acute CVA (cerebrovascular accident) ([place]) 07/11/2019  . Slurred speech 07/10/2019  . Cardiomyopathy ([place]) 11/20/2018  . History of stroke 09/06/2018  . Tobacco use 09/06/2018  . History of non-ST elevation myocardial infarction (NSTEMI) 05/05/2018  . Severe Vitamin D deficiency 04/02/2018  . Community acquired pneumonia of left lower lobe of lung 04/02/2018  . CKD (chronic kidney disease), stage III ([place]) = Secondary to Hypertension 04/02/2018  . Pneumonia 03/30/2018  . Metabolic acidosis 23/34/3568  . AKI (acute kidney injury) ([place]) 03/30/2018  .  N&V (nausea and vomiting) 03/30/2018  . Essential hypertension 03/30/2018    Boy Delamater 07/05/2020, 3:25 PM .[facility] [address] [place], [place], [address] Phone: [phone]   Fax:  [phone]  Name: QURON RUDDY MRN: [id number] Date of Birth: [birth year]

## 2020-07-08 ENCOUNTER — Other Ambulatory Visit (INDEPENDENT_AMBULATORY_CARE_PROVIDER_SITE_OTHER): Payer: Self-pay | Admitting: Nurse Practitioner

## 2020-07-08 ENCOUNTER — Other Ambulatory Visit (INDEPENDENT_AMBULATORY_CARE_PROVIDER_SITE_OTHER): Payer: Self-pay | Admitting: Internal Medicine

## 2020-07-08 ENCOUNTER — Ambulatory Visit: Payer: Medicaid Other

## 2020-07-08 DIAGNOSIS — I63512 Cerebral infarction due to unspecified occlusion or stenosis of left middle cerebral artery: Secondary | ICD-10-CM

## 2020-07-08 DIAGNOSIS — I10 Essential (primary) hypertension: Secondary | ICD-10-CM

## 2020-07-08 DIAGNOSIS — K5900 Constipation, unspecified: Secondary | ICD-10-CM

## 2020-07-08 DIAGNOSIS — G479 Sleep disorder, unspecified: Secondary | ICD-10-CM

## 2020-07-15 ENCOUNTER — Ambulatory Visit: Payer: Medicaid Other

## 2020-07-19 ENCOUNTER — Ambulatory Visit: Payer: Medicaid Other | Admitting: Physical Therapy

## 2020-07-26 ENCOUNTER — Ambulatory Visit: Payer: Medicaid Other | Admitting: Physical Therapy

## 2020-08-03 ENCOUNTER — Ambulatory Visit: Payer: Medicaid Other | Attending: Nurse Practitioner

## 2020-08-09 ENCOUNTER — Other Ambulatory Visit (INDEPENDENT_AMBULATORY_CARE_PROVIDER_SITE_OTHER): Payer: Self-pay | Admitting: Internal Medicine

## 2020-08-09 DIAGNOSIS — I10 Essential (primary) hypertension: Secondary | ICD-10-CM

## 2020-08-09 DIAGNOSIS — I63512 Cerebral infarction due to unspecified occlusion or stenosis of left middle cerebral artery: Secondary | ICD-10-CM

## 2020-08-09 DIAGNOSIS — G479 Sleep disorder, unspecified: Secondary | ICD-10-CM

## 2020-08-09 DIAGNOSIS — K5900 Constipation, unspecified: Secondary | ICD-10-CM

## 2020-08-15 ENCOUNTER — Ambulatory Visit: Payer: Medicaid Other | Admitting: Adult Health

## 2020-08-15 ENCOUNTER — Encounter: Payer: Self-pay | Admitting: Adult Health

## 2020-08-15 ENCOUNTER — Other Ambulatory Visit: Payer: Self-pay

## 2020-08-15 VITALS — BP 132/88 | HR 92

## 2020-08-15 DIAGNOSIS — I10 Essential (primary) hypertension: Secondary | ICD-10-CM | POA: Diagnosis not present

## 2020-08-15 DIAGNOSIS — I69322 Dysarthria following cerebral infarction: Secondary | ICD-10-CM

## 2020-08-15 DIAGNOSIS — G8191 Hemiplegia, unspecified affecting right dominant side: Secondary | ICD-10-CM | POA: Diagnosis not present

## 2020-08-15 DIAGNOSIS — E785 Hyperlipidemia, unspecified: Secondary | ICD-10-CM

## 2020-08-15 DIAGNOSIS — I6930 Unspecified sequelae of cerebral infarction: Secondary | ICD-10-CM | POA: Diagnosis not present

## 2020-08-15 NOTE — Patient Instructions (Addendum)
Continue to do exercises at home for hopeful ongoing improvement May consider doing additional therapy starting next year once your insurance will approve additional sessions   Continue clopidogrel 75 mg daily  and atorvastatin  for secondary stroke prevention  Continue to follow up with PCP regarding cholesterol and blood pressure management  Maintain strict control of hypertension with blood pressure goal below 130/9 and cholesterol with LDL cholesterol (bad cholesterol) goal below 70 mg/dL.       Followup in the future with me in 4 months or call earlier if needed       Thank you for coming to see [place] at [facility]. I hope we have been able to provide you high quality care today.  You may receive a patient satisfaction survey over the next few weeks. We would appreciate your feedback and comments so that we may continue to improve ourselves and the health of our patients.

## 2020-08-15 NOTE — Progress Notes (Signed)
[facility] [address] [place]. [place] [address] [phone]       STROKE FOLLOW UP NOTE  [name] Date of Birth:  1971/01/24 Medical Record Number:  [id number]   Reason for Referral: stroke follow up    CHIEF COMPLAINT:  Chief Complaint  Patient presents with  . Follow-up    3 month f/u. States he has been having some good days and bad days. States both feet stay swollen a lot  . treatment room    with girlfriend     HPI:  Today, 08/15/2020, Zachary Mccann is being seen for stroke follow-up accompanied by his girlfriend  History of left MCA and ACA scattered infarcts secondary to left ICA occlusion 07/10/2019 with residual right hemiparesis and expressive aphasia History of left cerebellar stroke 08/2018 with residual left-sided deficits Evaluated by Dr. Vickey Huger on 05/11/2020 for progressive deep watershed ischemia and development of left-sided Wallerian degeneration on 03/11/2020 with residual dysarthria and increased right hemiparesis  discharged from OT on 07/05/2020 due to meeting rehab potential Discharge from SLP on 05/25/2020 due to limited visits and wanting to focus more on strengthening and ambulation It was recommended to continue working with PT but per epic review, he has not had consistent follow-up  Residual deficits of right hemiparesis, severe dysarthria and verbal apraxia -he does report ongoing improvement Is able to transfer himself from bed to chair. Able to stand to put clothes on - his gf assists him further Right leg tremors upon standing which has been persistent and denies worsening Speech improving - more understandable per gf Denies new or worsening stroke/TIA symptoms  Remains on clopidogrel and atorvastatin for secondary stroke prevention without side effects Blood pressure today 132/88 Continues to follow closely with PCP for HTN and HLD management  Concerned regarding bilateral lower extremity edema  No further  concerns at this time      History provided for reference purposes only Update 05/11/2020 Dr. Vickey Huger: He was readmitted as a new stroke on 16 March 2020 he presented with severe dysarthria profound right-sided weakness, as a code stroke on the 26 of 2021 head CT was obtained and complaint compared to the previous 12 it showed no acute infarct at first.  There was a positive finding of a progressive deep watershed ischemia and development of left-sided wallerian degeneration since July of the previous year 2020.  Chronic multifocal vessel occlusion and small vessel disease as well.  The CT redemonstrated findings from previously a brain MRI was ordered to make sure that there was no acute change.   The MRI showed no acute infarct but deep watershed ischemia this was a progressive finding the extent of watershed ischemia in comparison to July 2020, small vessel disease.  The patient also had mild aphasia, he did not have headaches, no longer has diplopia his dysarthria has made it difficult for him to speak but he has no problems comprehending speech.  This time his right leg is the most weak. All pointing to the left middle cerebral artery.    Hospital follow-up 11/26/2019 JM: Zachary Mccann is a 49 year old male who is being seen today for hospital follow-up.  Residual deficits from most recent stroke include right hemiparesis, and severe expressive aphasia without improvement.  He denies residual dysphagia concerns.  He also continues to have residual left hemiparesis from prior stroke.  He is primarily nonverbal but is able to yes and no appropriately.  Difficulty obtaining large amount of information due to communication  barrier but able to ask yes/no questions with appropriate responses.  After inpatient rehab discharge, he was recommended for patient to receive home health therapies but he denies receiving any type of therapies at this time.  He is currently living in his own home with his girlfriend  who helps assist with daily needs.  He is nonambulatory and transfers via wheelchair.  He has continued on Plavix without bleeding or bruising.  Has continued on atorvastatin 40 mg daily without myalgias.  Blood pressure today 138/74.  He continues to follow with cardiology regularly.  No further concerns at this time.  Stroke admission summary Zachary Mccann is a 49 y.o. male with history of ongoing tobacco use, CAD w stent / Mi, hypertension, previous left cerebellar stroke with residual left-sided deficits 08/2018 and non compliant with medications after losing his insurance coverage, who was found to be much more dysarthric with right facial droop  and right-sided weakness.  Stroke work-up revealed left MCA and ACA scattered infarcts as evidenced on MRI due to left ICA occlusion (large vessel disease).  He did not receive IV t-PA due to late presentation (>4.5 hours from time of onset).  MRI showed extensive left hemisphere acute/subacute white matter infarcts involving the left centrum semiovale and corona radiata as well as border zone areas.  MRA showed left ICA occlusion, bilateral VA distal occlusion, BA proximal occlusion with distal retrograde recon from PCOMs.  Carotid Doppler showed left ICA/CCA occlusion.  2D echo showed an EF of 40%.  LDL 90.  A1c 5.6.  UDS positive for THC.  Recommended initiation of Plavix as he has a history of aspirin allergy.  Prior stroke 08/2018 left superior cerebellar infarct as well as evidence of prior strokes.  Hypercoagulable work-up at that time negative.  TEE unremarkable.  30 cardiac event monitor outpatient negative for A. fib.  History of CAD with cardiomyopathy non-STEMI post stent 04/2018 and medication noncompliance due to financial difficulty.  Current tobacco use of smoking cessation counseling provided.  HTN stable and recommended long-term BP goal 130-150 due to left ICA occlusion.  Initiated atorvastatin 40 mg daily.  Other stroke risk factors include  intracranial stenosis, previous EtOH use and prior drug use, THC use, obesity, family history of stroke and medication noncompliance.  Residual deficits of dysphagia, expressive aphasia and right hemiparesis and discharged to CIR for ongoing therapies.  He was discharged home with recommendation of ongoing therapies on 08/12/2019 with residual left hemiparesis, right hemiparesis, dysphagia and aphasia.      ROS:   14 system review of systems performed and negative with exception of speech difficulty, gait impairment, weakness  PMH:  Past Medical History:  Diagnosis Date  . CAD (coronary artery disease)    a. s/p NSTEMI in 04/2018 with DES to LCx.   [name] Hypertension   . Myocardial infarction (HCC)   . Pneumonia   . Stroke [facility])     PSH:  Past Surgical History:  Procedure Laterality Date  . CORONARY STENT INTERVENTION N/A 05/05/2018   Procedure: CORONARY STENT INTERVENTION;  Surgeon: Marykay Lex, MD;  Location: [facility] INVASIVE CV LAB;  Service: Cardiovascular;  Laterality: N/A;  . LEFT HEART CATH AND CORONARY ANGIOGRAPHY N/A 05/05/2018   Procedure: LEFT HEART CATH AND CORONARY ANGIOGRAPHY;  Surgeon: Marykay Lex, MD;  Location: [facility] INVASIVE CV LAB;  Service: Cardiovascular;  Laterality: N/A;  . TEE WITHOUT CARDIOVERSION N/A 09/08/2018   Procedure: TRANSESOPHAGEAL ECHOCARDIOGRAM (TEE);  Surgeon: Laurey Morale, MD;  Location: [facility] ENDOSCOPY;  Service: Cardiovascular;  Laterality: N/A;    Social History:  Social History   Socioeconomic History  . Marital status: Single    Spouse name: Not on file  . Number of children: Not on file  . Years of education: Not on file  . Highest education level: Not on file  Occupational History  . Not on file  Tobacco Use  . Smoking status: Former Smoker    Types: Cigarettes  . Smokeless tobacco: Never Used  . Tobacco comment: smoked for 10-20 years at 1/2 ppd as of 2019  Vaping Use  . Vaping Use: Never used  Substance and Sexual Activity   . Alcohol use: Not Currently  . Drug use: Not Currently  . Sexual activity: Yes  Other Topics Concern  . 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 Lives with girlfriend.   Social Determinants of Health   Financial Resource Strain:   . Difficulty of Paying Living Expenses: Not on file  Food Insecurity:   . Worried About Programme researcher, broadcasting/film/video in the Last Year: Not on file  . Ran Out of Food in the Last Year: Not on file  Transportation Needs:   . Lack of Transportation (Medical): Not on file  . Lack of Transportation (Non-Medical): Not on file  Physical Activity:   . Days of Exercise per Week: Not on file  . Minutes of Exercise per Session: Not on file  Stress:   . Feeling of Stress : Not on file  Social Connections:   . Frequency of Communication with Friends and Family: Not on file  . Frequency of Social Gatherings with Friends and Family: Not on file  . Attends Religious Services: Not on file  . Active Member of Clubs or Organizations: Not on file  . Attends Banker Meetings: Not on file  . Marital Status: Not on file  Intimate Partner Violence:   . Fear of Current or Ex-Partner: Not on file  . Emotionally Abused: Not on file  . Physically Abused: Not on file  . Sexually Abused: Not on file    Family History:  Family History  Problem Relation Age of Onset  . Hypertension Mother   . Stroke Mother   . Dementia Mother   . Hypertension Father   . Stroke Father   . Cancer Father   . Alcoholism Father   . Cancer Sister     Medications:   Current Outpatient Medications on File Prior to Visit  Medication Sig Dispense Refill  . acetaminophen (TYLENOL) 325 MG tablet Take 2 tablets (650 mg total) by mouth every 6 (six) hours as needed for mild pain (or Fever >/= 101).    [name] amLODipine (NORVASC) 10 MG tablet Take 1 tablet by mouth once daily 90 tablet 0  .  atorvastatin (LIPITOR) 40 MG tablet TAKE 1 TABLET BY MOUTH ONCE DAILY AT  6  PM 90 tablet 0  . carvedilol (COREG) 6.25 MG tablet TAKE 1 TABLET BY MOUTH TWICE DAILY WITH MEALS 60 tablet 0  . clopidogrel (PLAVIX) 75 MG tablet Take 1 tablet (75 mg total) by mouth daily. 90 tablet 3  . hydrALAZINE (APRESOLINE) 25 MG tablet TAKE 1 TABLET BY MOUTH THREE TIMES DAILY 90 tablet 0  . Melatonin 10 MG TABS Take 10 mg by mouth at bedtime. 30 tablet 0  . mirtazapine (REMERON) 7.5 MG tablet Take 1  tablet (7.5 mg total) by mouth at bedtime. 30 tablet 0  . senna-docusate (SENOKOT-S) 8.6-50 MG tablet Take 2 tablets by mouth at bedtime.    . traMADol (ULTRAM) 50 MG tablet Take 1 tablet (50 mg total) by mouth every 8 (eight) hours as needed for severe pain. 20 tablet 0  . [DISCONTINUED] amLODipine (NORVASC) 10 MG tablet Take 1 tablet (10 mg total) by mouth daily. 30 tablet 0  . [DISCONTINUED] atorvastatin (LIPITOR) 40 MG tablet Take 1 tablet (40 mg total) by mouth daily at 6 PM. 30 tablet 0  . [DISCONTINUED] carvedilol (COREG) 6.25 MG tablet Take 1 tablet (6.25 mg total) by mouth 2 (two) times daily with a meal. 60 tablet 0   No current facility-administered medications on file prior to visit.    Allergies:   Allergies  Allergen Reactions  . Aspirin Swelling     Physical Exam  Vitals:   08/15/20 0912  BP: 132/88  Pulse: 92   There is no height or weight on file to calculate BMI. No exam data present  General: Pleasant morbidly obese middle-aged African-American male, seated, in no evident distress Head: head normocephalic and atraumatic.   Neck: supple with no carotid or supraclavicular bruits Cardiovascular: regular rate and rhythm, no murmurs Musculoskeletal: no deformity Skin:  no rash/petichiae Vascular:  Normal pulses all extremities   Neurologic Exam Mental Status: Awake and fully alert.   Severe dysarthria.   Attempts at speaking in short sentences with great difficulty understanding.   Able to follow commands without difficulty.  Oriented to place and time with choices and nodding yes/no appropriately to questions.  Unable to fully assess cognition due to communication deficit but appears intact.  Mood and affect appropriate.  Cranial Nerves: Pupils equal, briskly reactive to light. Extraocular movements full without nystagmus. Visual fields full to confrontation. Hearing intact. Facial sensation intact.  Right lower facial droop. Motor: Normal bulk and tone.  RUE: 3/5 with weak grip strength  RLE: 3/5 hip flexor and ankle dorsiflexion/plantarflexion; 4/5 knee extension and flexion; increased tone LUE: 5/5 LLE: 4/5 hip flexor and ankle dorsiflexion Sensory.: intact to touch , pinprick , position and vibratory sensation.  Coordination: Rapid alternating movements diminished on right. Finger-to-nose performed accurately LUE and heel-to-shin unable to perform adequately due to weakness Gait and Station: Deferred as RW not present during visit Reflexes: 1+ and symmetric. Toes downgoing.     NIHSS  6 (admission 7) 1a.  Level of consciousness 0 1b. LOC questions 0 1c. LOC commands 0 2.  Best gaze 0 3.  Visual 0 4.  Facial palsy 1 5a.  Motor arm-left 0 5b.  Motor arm-right 1 6a.  Motor leg-left 0 6b.  Motor leg-right 2 7.  Limb ataxia 0 8.  Sensory 0 9.  Best language 0 10.  Dysarthria 2 11.  Extinction and inattention 0  Modified Rankin  3-4      ASSESSMENT: Zachary Mccann is a 49 y.o. year old male presented with worsening dysarthria and right facial droop on 07/10/2019 with stroke work-up showing left MCA and ACA scattered infarcts secondary to large vessel disease with left ICA occlusion.  Presented with worsening right-sided weakness with work-up positive for progressive deep watershed ischemia and development of left-sided Wallerian degeneration.  Vascular risk factors include tobacco use, CAD with cardiomyopathy and MI post stent, prior stroke with residual  left hemiparesis, HTN, HLD and medication noncompliance due to loss of insurance.     PLAN:  1. Left  MCA/ACA infarcts:  a. Residual deficits: Right spastic hemiparesis and severe dysarthria.  Encouraged continued HEP and may consider additional therapy next year as he has completed available therapy for this year per insurance b. Continue clopidogrel 75 mg daily  and atorvastatin for secondary stroke prevention.  c. Maintain strict control of hypertension with blood pressure goal below 130/90, diabetes with hemoglobin A1c goal below 6.5% and cholesterol with LDL cholesterol (bad cholesterol) goal below 70 mg/dL.  I also advised the patient to eat a healthy diet with plenty of whole grains, cereals, fruits and vegetables, exercise regularly with at least 30 minutes of continuous activity daily and maintain ideal body weight. 2. HTN: Advised to continue current treatment regimen.  Today's BP stable.  Advised to continue to monitor at home along with continued follow-up with PCP for management 3. HLD: Advised to continue current treatment regimen along with continued follow-up with PCP for future prescribing and monitoring of lipid panel 4. Tobacco use: Endorses cessation and highly encouraged continuation 5. CAD: Continue to follow with cardiology for ongoing monitoring and management 6. Suspected sleep apnea: Previously evaluated by Dr. Vickey Huger on 01/19/2019 and recommended pursuing sleep apnea testing but unfortunately due to COVID-19 restrictions at that time, further testing unable to be completed. Will reach out to Dr. Vickey Huger in regards to scheduling sleep apnea testing at this time.    Follow up in 4 months or call earlier if needed   I spent 30 minutes of face-to-face and non-face-to-face time with patient and wife.  This included previsit chart review, lab review, study review, order entry, electronic health record documentation, patient education regarding history of prior stroke with  residual deficits, importance of managing stroke risk factors, and answered all questions to patient satisfaction    Ihor Austin, [facility]  [facility] [address] [place], [place] [address]  Phone [phone] Fax [phone] Note: This document was prepared with digital dictation and possible smart phrase technology. Any transcriptional errors that result from this process are unintentional.

## 2020-08-18 ENCOUNTER — Encounter: Payer: Self-pay | Admitting: Speech Pathology

## 2020-08-18 NOTE — Therapy (Signed)
Documentation entered in error.  [facility] [address] [place], [place], [address] Phone: [phone]   Fax:  [phone]  Patient Details  Name: Zachary Mccann MRN: [id number] Date of Birth: 14-May-1971 Referring Provider:  No ref. provider found  Encounter Date: 08/18/2020   Arlana Lindau 08/18/2020, 3:24 PM  [facility] [address] [place], [place], [address] Phone: [phone]   Fax:  [phone]

## 2020-08-23 ENCOUNTER — Other Ambulatory Visit (INDEPENDENT_AMBULATORY_CARE_PROVIDER_SITE_OTHER): Payer: Self-pay | Admitting: Internal Medicine

## 2020-08-23 DIAGNOSIS — K5900 Constipation, unspecified: Secondary | ICD-10-CM

## 2020-08-23 DIAGNOSIS — I63512 Cerebral infarction due to unspecified occlusion or stenosis of left middle cerebral artery: Secondary | ICD-10-CM

## 2020-08-23 DIAGNOSIS — I10 Essential (primary) hypertension: Secondary | ICD-10-CM

## 2020-08-23 DIAGNOSIS — G479 Sleep disorder, unspecified: Secondary | ICD-10-CM

## 2020-09-02 ENCOUNTER — Inpatient Hospital Stay (HOSPITAL_COMMUNITY)
Admission: EM | Admit: 2020-09-02 | Discharge: 2020-09-04 | DRG: 177 | Disposition: A | Discharge status: Home / Self Care | Payer: Medicaid Other | Attending: Internal Medicine | Admitting: Internal Medicine

## 2020-09-02 ENCOUNTER — Emergency Department (HOSPITAL_COMMUNITY): Payer: Medicaid Other

## 2020-09-02 ENCOUNTER — Other Ambulatory Visit: Payer: Self-pay

## 2020-09-02 ENCOUNTER — Encounter (HOSPITAL_COMMUNITY): Payer: Self-pay | Admitting: Emergency Medicine

## 2020-09-02 DIAGNOSIS — Z993 Dependence on wheelchair: Secondary | ICD-10-CM

## 2020-09-02 DIAGNOSIS — I252 Old myocardial infarction: Secondary | ICD-10-CM | POA: Diagnosis not present

## 2020-09-02 DIAGNOSIS — I2510 Atherosclerotic heart disease of native coronary artery without angina pectoris: Secondary | ICD-10-CM | POA: Diagnosis not present

## 2020-09-02 DIAGNOSIS — N1831 Chronic kidney disease, stage 3a: Secondary | ICD-10-CM | POA: Diagnosis present

## 2020-09-02 DIAGNOSIS — I130 Hypertensive heart and chronic kidney disease with heart failure and stage 1 through stage 4 chronic kidney disease, or unspecified chronic kidney disease: Secondary | ICD-10-CM | POA: Diagnosis present

## 2020-09-02 DIAGNOSIS — I5022 Chronic systolic (congestive) heart failure: Secondary | ICD-10-CM | POA: Diagnosis present

## 2020-09-02 DIAGNOSIS — Z79899 Other long term (current) drug therapy: Secondary | ICD-10-CM

## 2020-09-02 DIAGNOSIS — I69322 Dysarthria following cerebral infarction: Secondary | ICD-10-CM

## 2020-09-02 DIAGNOSIS — R739 Hyperglycemia, unspecified: Secondary | ICD-10-CM | POA: Diagnosis present

## 2020-09-02 DIAGNOSIS — R4781 Slurred speech: Secondary | ICD-10-CM | POA: Diagnosis not present

## 2020-09-02 DIAGNOSIS — N179 Acute kidney failure, unspecified: Secondary | ICD-10-CM | POA: Diagnosis not present

## 2020-09-02 DIAGNOSIS — I69351 Hemiplegia and hemiparesis following cerebral infarction affecting right dominant side: Secondary | ICD-10-CM | POA: Diagnosis not present

## 2020-09-02 DIAGNOSIS — Z87891 Personal history of nicotine dependence: Secondary | ICD-10-CM | POA: Diagnosis not present

## 2020-09-02 DIAGNOSIS — R000 Tachycardia, unspecified: Secondary | ICD-10-CM | POA: Diagnosis not present

## 2020-09-02 DIAGNOSIS — I428 Other cardiomyopathies: Secondary | ICD-10-CM | POA: Diagnosis not present

## 2020-09-02 DIAGNOSIS — R4020 Unspecified coma: Secondary | ICD-10-CM | POA: Diagnosis not present

## 2020-09-02 DIAGNOSIS — I10 Essential (primary) hypertension: Secondary | ICD-10-CM | POA: Diagnosis not present

## 2020-09-02 DIAGNOSIS — R55 Syncope and collapse: Secondary | ICD-10-CM | POA: Diagnosis not present

## 2020-09-02 DIAGNOSIS — Z6841 Body Mass Index (BMI) 40.0 and over, adult: Secondary | ICD-10-CM | POA: Diagnosis not present

## 2020-09-02 DIAGNOSIS — G479 Sleep disorder, unspecified: Secondary | ICD-10-CM | POA: Diagnosis present

## 2020-09-02 DIAGNOSIS — Z8673 Personal history of transient ischemic attack (TIA), and cerebral infarction without residual deficits: Secondary | ICD-10-CM

## 2020-09-02 DIAGNOSIS — J1282 Pneumonia due to coronavirus disease 2019: Secondary | ICD-10-CM | POA: Diagnosis present

## 2020-09-02 DIAGNOSIS — Z743 Need for continuous supervision: Secondary | ICD-10-CM | POA: Diagnosis not present

## 2020-09-02 DIAGNOSIS — U071 COVID-19: Principal | ICD-10-CM | POA: Diagnosis present

## 2020-09-02 DIAGNOSIS — Z7902 Long term (current) use of antithrombotics/antiplatelets: Secondary | ICD-10-CM

## 2020-09-02 DIAGNOSIS — E785 Hyperlipidemia, unspecified: Secondary | ICD-10-CM

## 2020-09-02 DIAGNOSIS — I6789 Other cerebrovascular disease: Secondary | ICD-10-CM | POA: Diagnosis not present

## 2020-09-02 DIAGNOSIS — E860 Dehydration: Secondary | ICD-10-CM | POA: Diagnosis present

## 2020-09-02 LAB — URINALYSIS, ROUTINE W REFLEX MICROSCOPIC
Bilirubin Urine: NEGATIVE
Glucose, UA: NEGATIVE mg/dL
Ketones, ur: NEGATIVE mg/dL
Leukocytes,Ua: NEGATIVE
Nitrite: NEGATIVE
Protein, ur: 100 mg/dL — AB
Specific Gravity, Urine: 1.014 (ref 1.005–1.030)
pH: 5 (ref 5.0–8.0)

## 2020-09-02 LAB — COMPREHENSIVE METABOLIC PANEL
ALT: 12 U/L (ref 0–44)
AST: 18 U/L (ref 15–41)
Albumin: 3.6 g/dL (ref 3.5–5.0)
Alkaline Phosphatase: 72 U/L (ref 38–126)
Anion gap: 13 (ref 5–15)
BUN: 36 mg/dL — ABNORMAL HIGH (ref 6–20)
CO2: 22 mmol/L (ref 22–32)
Calcium: 8.8 mg/dL — ABNORMAL LOW (ref 8.9–10.3)
Chloride: 103 mmol/L (ref 98–111)
Creatinine, Ser: 2.96 mg/dL — ABNORMAL HIGH (ref 0.61–1.24)
GFR calc Af Amer: 27 mL/min — ABNORMAL LOW (ref >60)
GFR calc non Af Amer: 24 mL/min — ABNORMAL LOW (ref >60)
Glucose, Bld: 115 mg/dL — ABNORMAL HIGH (ref 70–99)
Potassium: 3.9 mmol/L (ref 3.5–5.1)
Sodium: 138 mmol/L (ref 135–145)
Total Bilirubin: 0.8 mg/dL (ref 0.3–1.2)
Total Protein: 9 g/dL — ABNORMAL HIGH (ref 6.5–8.1)

## 2020-09-02 LAB — CBC WITH DIFFERENTIAL/PLATELET
Abs Immature Granulocytes: 0.02 10*3/uL (ref 0.00–0.07)
Basophils Absolute: 0 10*3/uL (ref 0.0–0.1)
Basophils Relative: 0 %
Eosinophils Absolute: 0 10*3/uL (ref 0.0–0.5)
Eosinophils Relative: 0 %
HCT: 43.5 % (ref 39.0–52.0)
Hemoglobin: 13.7 g/dL (ref 13.0–17.0)
Immature Granulocytes: 0 %
Lymphocytes Relative: 11 %
Lymphs Abs: 0.6 10*3/uL — ABNORMAL LOW (ref 0.7–4.0)
MCH: 29.6 pg (ref 26.0–34.0)
MCHC: 31.5 g/dL (ref 30.0–36.0)
MCV: 94 fL (ref 80.0–100.0)
Monocytes Absolute: 0.4 10*3/uL (ref 0.1–1.0)
Monocytes Relative: 6 %
Neutro Abs: 4.7 10*3/uL (ref 1.7–7.7)
Neutrophils Relative %: 83 %
Platelets: 202 10*3/uL (ref 150–400)
RBC: 4.63 MIL/uL (ref 4.22–5.81)
RDW: 12.6 % (ref 11.5–15.5)
WBC: 5.8 10*3/uL (ref 4.0–10.5)
nRBC: 0 % (ref 0.0–0.2)

## 2020-09-02 LAB — PROTIME-INR
INR: 1 (ref 0.8–1.2)
Prothrombin Time: 13.1 seconds (ref 11.4–15.2)

## 2020-09-02 LAB — LACTIC ACID, PLASMA
Lactic Acid, Venous: 1.2 mmol/L (ref 0.5–1.9)
Lactic Acid, Venous: 0.6 mmol/L (ref 0.5–1.9)

## 2020-09-02 LAB — C-REACTIVE PROTEIN: CRP: 14.9 mg/dL — ABNORMAL HIGH (ref <1.0)

## 2020-09-02 LAB — D-DIMER, QUANTITATIVE: D-Dimer, Quant: 1.09 ug/mL-FEU — ABNORMAL HIGH (ref 0.00–0.50)

## 2020-09-02 LAB — APTT: aPTT: 34 s (ref 24–36)

## 2020-09-02 LAB — SARS CORONAVIRUS 2 BY RT PCR (HOSPITAL ORDER, PERFORMED IN ~~LOC~~ HOSPITAL LAB): SARS Coronavirus 2: POSITIVE — AB

## 2020-09-02 MED ORDER — ACETAMINOPHEN 325 MG PO TABS
650.0000 mg | ORAL_TABLET | Freq: Once | ORAL | Status: AC
  Administered 2020-09-02: 650 mg via ORAL
  Filled 2020-09-02: qty 2

## 2020-09-02 MED ORDER — SODIUM CHLORIDE 0.9 % IV SOLN
1.0000 mg/kg | Freq: Two times a day (BID) | INTRAVENOUS | Status: DC
  Administered 2020-09-02 – 2020-09-04 (×4): 130 mg via INTRAVENOUS
  Filled 2020-09-02 (×7): qty 1.04

## 2020-09-02 MED ORDER — METRONIDAZOLE IN NACL 5-0.79 MG/ML-% IV SOLN
500.0000 mg | Freq: Three times a day (TID) | INTRAVENOUS | Status: DC
  Administered 2020-09-02 – 2020-09-03 (×3): 500 mg via INTRAVENOUS
  Filled 2020-09-02 (×3): qty 100

## 2020-09-02 MED ORDER — SODIUM CHLORIDE 0.9 % IV SOLN
100.0000 mg | Freq: Every day | INTRAVENOUS | Status: DC
  Administered 2020-09-03 – 2020-09-04 (×2): 100 mg via INTRAVENOUS
  Filled 2020-09-02 (×2): qty 20

## 2020-09-02 MED ORDER — LACTATED RINGERS IV BOLUS (SEPSIS)
500.0000 mL | Freq: Once | INTRAVENOUS | Status: AC
  Administered 2020-09-02: 500 mL via INTRAVENOUS

## 2020-09-02 MED ORDER — MIRTAZAPINE 15 MG PO TABS
7.5000 mg | ORAL_TABLET | Freq: Every day | ORAL | Status: DC
  Administered 2020-09-02 – 2020-09-03 (×2): 7.5 mg via ORAL
  Filled 2020-09-02 (×2): qty 1

## 2020-09-02 MED ORDER — METHYLPREDNISOLONE SODIUM SUCC 1000 MG IJ SOLR
INTRAMUSCULAR | Status: AC
  Filled 2020-09-02: qty 8

## 2020-09-02 MED ORDER — LACTATED RINGERS IV BOLUS (SEPSIS): 1000.0000 mL | Freq: Once | INTRAVENOUS | Status: DC

## 2020-09-02 MED ORDER — VANCOMYCIN HCL 2000 MG/400ML IV SOLN
2000.0000 mg | Freq: Once | INTRAVENOUS | Status: AC
  Administered 2020-09-02: 2000 mg via INTRAVENOUS
  Filled 2020-09-02: qty 400

## 2020-09-02 MED ORDER — LACTATED RINGERS IV BOLUS (SEPSIS)
1000.0000 mL | Freq: Once | INTRAVENOUS | Status: AC
  Administered 2020-09-02: 1000 mL via INTRAVENOUS

## 2020-09-02 MED ORDER — ATORVASTATIN CALCIUM 40 MG PO TABS
40.0000 mg | ORAL_TABLET | Freq: Every day | ORAL | Status: DC
  Administered 2020-09-03 – 2020-09-04 (×2): 40 mg via ORAL
  Filled 2020-09-02 (×2): qty 1

## 2020-09-02 MED ORDER — SODIUM CHLORIDE 0.9 % IV SOLN: 100.0000 mg | Freq: Every day | INTRAVENOUS | Status: DC

## 2020-09-02 MED ORDER — SODIUM CHLORIDE 0.9 % IV SOLN
2.0000 g | Freq: Once | INTRAVENOUS | Status: AC
  Administered 2020-09-02: 2 g via INTRAVENOUS
  Filled 2020-09-02: qty 2

## 2020-09-02 MED ORDER — SODIUM CHLORIDE 0.9 % IV SOLN: 200.0000 mg | Freq: Once | INTRAVENOUS | Status: DC

## 2020-09-02 MED ORDER — HEPARIN SODIUM (PORCINE) 5000 UNIT/ML IJ SOLN
5000.0000 [IU] | Freq: Three times a day (TID) | INTRAMUSCULAR | Status: DC
  Administered 2020-09-02 – 2020-09-04 (×6): 5000 [IU] via SUBCUTANEOUS
  Filled 2020-09-02 (×6): qty 1

## 2020-09-02 MED ORDER — CLOPIDOGREL BISULFATE 75 MG PO TABS
75.0000 mg | ORAL_TABLET | Freq: Every day | ORAL | Status: DC
  Administered 2020-09-03 – 2020-09-04 (×2): 75 mg via ORAL
  Filled 2020-09-02 (×2): qty 1

## 2020-09-02 MED ORDER — PREDNISONE 50 MG PO TABS: 50.0000 mg | ORAL_TABLET | Freq: Every day | ORAL | Status: DC

## 2020-09-02 MED ORDER — ACETAMINOPHEN 325 MG PO TABS: 650.0000 mg | ORAL_TABLET | Freq: Four times a day (QID) | ORAL | Status: DC | PRN

## 2020-09-02 MED ORDER — SODIUM CHLORIDE 0.9 % IV SOLN
100.0000 mg | INTRAVENOUS | Status: AC
  Administered 2020-09-02 (×2): 100 mg via INTRAVENOUS
  Filled 2020-09-02: qty 20

## 2020-09-02 MED ORDER — PANTOPRAZOLE SODIUM 40 MG PO TBEC
40.0000 mg | DELAYED_RELEASE_TABLET | Freq: Every day | ORAL | Status: DC
  Administered 2020-09-02 – 2020-09-04 (×3): 40 mg via ORAL
  Filled 2020-09-02 (×3): qty 1

## 2020-09-02 MED ORDER — VANCOMYCIN HCL IN DEXTROSE 1-5 GM/200ML-% IV SOLN: 1000.0000 mg | Freq: Once | INTRAVENOUS | Status: DC

## 2020-09-02 NOTE — ED Notes (Signed)
Two sets of blood cultures drawn and sent to the lab.

## 2020-09-02 NOTE — ED Triage Notes (Addendum)
Pt brought in by [facility].  LKW @ 1200 this morning. Woke up @ 12pm today. Girlfriend stated pt became unresponsive and collapsed to the floor 1347. Called EMS. Pt alert when they arrived. Hx of previous stroke with right sided weakness deficit.

## 2020-09-02 NOTE — ED Notes (Signed)
AC notified to bring med from the main pharmacy.

## 2020-09-02 NOTE — Progress Notes (Signed)
A consult was received from an ED physician for Vancomycin and cefepime per pharmacy dosing.  The patient's profile has been reviewed for ht/wt/allergies/indication/available labs.   A one time order has been placed for Cefepime 2gm and Vancomycin 2000mg  IV.   Further antibiotics/pharmacy consults should be ordered by admitting physician if indicated.                       Thank you, 09/02/2020  4:50 PM

## 2020-09-02 NOTE — ED Provider Notes (Signed)
[facility] EMERGENCY DEPARTMENT Provider Note   CSN: [id number] Arrival date & time: 09/02/20  1357     History Chief Complaint  Patient presents with  . Loss of Consciousness    Zachary Mccann is a 49 y.o. male.  EMS called by pt's family when he became unresponsive.  Pt has a history of a cva with hemiparesis and CAD.  Pt reports some shortness of breath. (pt has language impairment due to stroke)   The history is provided by the EMS personnel and the patient.  Loss of Consciousness Episode history:  Single Most recent episode:  Today Progression:  Partially resolved Witnessed: yes   Relieved by:  Nothing Worsened by:  Nothing Associated symptoms: fever   Pt's temp to 103     Past Medical History:  Diagnosis Date  . CAD (coronary artery disease)    a. s/p NSTEMI in 04/2018 with DES to LCx.   [name] Hypertension   . Myocardial infarction (HCC)   . Pneumonia   . Stroke [facility])     Patient Active Problem List   Diagnosis Date Noted  . Acute ischemic left middle cerebral artery (MCA) stroke (HCC) 05/11/2020  . Cerebral infarction, watershed distribution, unilateral, acute (HCC) 05/11/2020  . Dysarthria as late effect of cerebrovascular accident (CVA) 05/11/2020  . Visual field constriction, bilateral 05/11/2020  . Hemiparesis affecting right side as late effect of cerebrovascular accident (HCC) 05/11/2020  . Dysarthria, post-stroke 04/07/2020  . 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  . Hyperglycemia   . Anemia   . Acute pain of right knee   . Primary osteoarthritis of right knee   . Expressive language impairment   . Benign essential HTN   . Acute bilat watershed infarction [facility]) 03/16/2020  . Cerebral infarction, watershed distribution, unilateral, chronic 03/16/2020  . Acute renal failure (HCC)   . History of CVA in adulthood   . Dyslipidemia   . CVA (cerebral vascular accident) (HCC) 03/11/2020  . Leukocytosis   . Hemiparesis affecting dominant side as late  effect of stroke (HCC)   . Labile blood pressure   . Sleep disturbance   . Benign hypertensive heart and kidney disease with diastolic CHF, NYHA class II and CKD stage III (HCC)   . Chronic systolic congestive heart failure (HCC)   . Morbid obesity (HCC)   . Uncontrolled hypertension   . Dysphagia, post-stroke   . Cardiomegaly   . Acute systolic congestive heart failure (HCC)   . 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 Cerebral embolism with cerebral infarction 07/11/2019  . Acute CVA (cerebrovascular accident) (HCC) 07/11/2019  . Slurred speech 07/10/2019  . Cardiomyopathy (HCC) 11/20/2018  . History of stroke 09/06/2018  . Tobacco use 09/06/2018  . History of non-ST elevation myocardial infarction (NSTEMI) 05/05/2018  . Severe Vitamin D deficiency 04/02/2018  . Community acquired pneumonia of left lower lobe of lung 04/02/2018  . CKD (chronic kidney disease), stage III (HCC) = Secondary to Hypertension 04/02/2018  . Pneumonia 03/30/2018  . Metabolic acidosis 03/30/2018  . AKI (acute kidney injury) (HCC) 03/30/2018  . N&V (nausea and vomiting) 03/30/2018  . Essential hypertension 03/30/2018    Past Surgical History:  Procedure Laterality Date  . CORONARY STENT INTERVENTION N/A 05/05/2018   Procedure: CORONARY STENT INTERVENTION;  Surgeon: Marykay Lex, MD;  Location: [facility] INVASIVE CV LAB;  Service: Cardiovascular;  Laterality: N/A;  . LEFT HEART CATH AND CORONARY ANGIOGRAPHY N/A 05/05/2018   Procedure: LEFT HEART CATH AND CORONARY  ANGIOGRAPHY;  Surgeon: Marykay Lex, MD;  Location: [facility] INVASIVE CV LAB;  Service: Cardiovascular;  Laterality: N/A;  . TEE WITHOUT CARDIOVERSION N/A 09/08/2018   Procedure: TRANSESOPHAGEAL ECHOCARDIOGRAM (TEE);  Surgeon: Laurey Morale, MD;  Location: [facility] ENDOSCOPY;  Service: Cardiovascular;  Laterality: N/A;       Family History  Problem Relation Age of Onset  . Hypertension Mother   . Stroke Mother   . Dementia Mother   .  Hypertension Father   . Stroke Father   . Cancer Father   . Alcoholism Father   . Cancer Sister     Social History   Tobacco Use  . Smoking status: Former Smoker    Types: Cigarettes  . Smokeless tobacco: Never Used  . Tobacco comment: smoked for 10-20 years at 1/2 ppd as of 2019  Vaping Use  . Vaping Use: Never used  Substance Use Topics  . Alcohol use: Not Currently  . Drug use: Not Currently    Home Medications Prior to Admission medications   Medication Sig Start Date End Date Taking? Authorizing Provider  acetaminophen (TYLENOL) 325 MG tablet Take 2 tablets (650 mg total) by mouth every 6 (six) hours as needed for mild pain (or Fever >/= 101). 03/31/20   Angiulli, Mcarthur Rossetti, PA-C  amLODipine (NORVASC) 10 MG tablet Take 1 tablet by mouth once daily 07/11/20   Karilyn Cota, Nimish C, MD  atorvastatin (LIPITOR) 40 MG tablet TAKE 1 TABLET BY MOUTH ONCE DAILY AT  6  PM Patient taking differently: Take 40 mg by mouth daily.  07/11/20   Lilly Cove C, MD  carvedilol (COREG) 6.25 MG tablet TAKE 1 TABLET BY MOUTH TWICE DAILY WITH MEALS 08/09/20   Lilly Cove C, MD  clopidogrel (PLAVIX) 75 MG tablet Take 1 tablet (75 mg total) by mouth daily. 05/11/20   Dohmeier, Porfirio Mylar, MD  hydrALAZINE (APRESOLINE) 25 MG tablet TAKE 1 TABLET BY MOUTH THREE TIMES DAILY Patient taking differently: Take 25 mg by mouth 3 (three) times daily.  08/23/20   Wilson Singer, MD  Melatonin 10 MG TABS Take 10 mg by mouth at bedtime. 03/31/20   Angiulli, Mcarthur Rossetti, PA-C  mirtazapine (REMERON) 7.5 MG tablet Take 1 tablet (7.5 mg total) by mouth at bedtime. 03/31/20   Angiulli, Mcarthur Rossetti, PA-C  senna-docusate (SENOKOT-S) 8.6-50 MG tablet Take 2 tablets by mouth at bedtime. 10/12/19   [name], NP  traMADol (ULTRAM) 50 MG tablet Take 1 tablet (50 mg total) by mouth every 8 (eight) hours as needed for severe pain. 03/31/20   Angiulli, Mcarthur Rossetti, PA-C  amLODipine (NORVASC) 10 MG tablet Take 1 tablet (10 mg total) by mouth  daily. 10/12/19   [name], NP  atorvastatin (LIPITOR) 40 MG tablet Take 1 tablet (40 mg total) by mouth daily at 6 PM. 10/12/19   [name], NP  carvedilol (COREG) 6.25 MG tablet Take 1 tablet (6.25 mg total) by mouth 2 (two) times daily with a meal. 10/12/19   [name], NP    Allergies    Aspirin  Review of Systems   Review of Systems  Unable to perform ROS: Patient nonverbal  Constitutional: Positive for fever.  Cardiovascular: Positive for syncope.  All other systems reviewed and are negative.   Physical Exam Updated Vital Signs BP 123/78   Pulse 94   Temp (!) 103.1 F (39.5 C) (Oral)   Resp (!) 25   Wt 126 kg   SpO2 92%  BMI 37.67 kg/m   Physical Exam  ED Results / Procedures / Treatments   Labs (all labs ordered are listed, but only abnormal results are displayed) Labs Reviewed  SARS CORONAVIRUS 2 BY RT PCR (HOSPITAL ORDER, PERFORMED IN [place] [facility]) - Abnormal; Notable for the following components:      Result Value   SARS Coronavirus 2 POSITIVE (*)    All other components within normal limits  CBC WITH DIFFERENTIAL/PLATELET - Abnormal; Notable for the following components:   Lymphs Abs 0.6 (*)    All other components within normal limits  COMPREHENSIVE METABOLIC PANEL - Abnormal; Notable for the following components:   Glucose, Bld 115 (*)    BUN 36 (*)    Creatinine, Ser 2.96 (*)    Calcium 8.8 (*)    Total Protein 9.0 (*)    GFR calc non Af Amer 24 (*)    GFR calc Af Amer 27 (*)    All other components within normal limits  CULTURE, BLOOD (ROUTINE X 2)  CULTURE, BLOOD (ROUTINE X 2)  LACTIC ACID, PLASMA  PROTIME-INR  APTT  LACTIC ACID, PLASMA  URINALYSIS, ROUTINE W REFLEX MICROSCOPIC    EKG EKG Interpretation  Date/Time:  Friday September 02 2020 14:18:11 EDT Ventricular Rate:  105 PR Interval:  172 QRS Duration: 100 QT Interval:  326 QTC Calculation: 430 R Axis:   23 Text Interpretation: Sinus tachycardia Right  atrial enlargement Nonspecific T wave abnormality Abnormal ECG similar to Mar 2021 Confirmed by Pricilla Loveless [phone]) on 09/02/2020 2:35:29 PM   Radiology DG Chest Portable 1 View  Result Date: 09/02/2020 CLINICAL DATA:  Fever EXAM: PORTABLE CHEST 1 VIEW COMPARISON:  Radiograph 07/28/2019 FINDINGS: Mixed patchy and interstitial opacities are present in the lungs most focally in the bilateral lung bases and left basilar periphery as well as the right mid lung. Slight vascular congestion is noted as well with stable cardiomegaly from comparison exams. Partial obscuration of left hemidiaphragm may reflect small effusion. No right effusion or pneumothorax. Telemetry leads overlie the chest. No acute osseous or soft tissue abnormality. Degenerative changes are present in the imaged spine and shoulders. IMPRESSION: Findings concerning for a multifocal infectious process including potential atypical infection such as COVID-19. Some additional vascular congestion in the setting of cardiomegaly with trace left effusion could reflect some superimposed edema/features of CHF. Electronically Signed   By: Kreg Shropshire M.D.   On: 09/02/2020 16:08    Procedures Procedures (including critical care time)  Medications Ordered in ED Medications  metroNIDAZOLE (FLAGYL) IVPB 500 mg (0 mg Intravenous Stopped 09/02/20 1615)  vancomycin (VANCOREADY) IVPB 2000 mg/400 mL (2,000 mg Intravenous New Bag/Given 09/02/20 1604)  acetaminophen (TYLENOL) tablet 650 mg (has no administration in time range)  ceFEPIme (MAXIPIME) 2 g in sodium chloride 0.9 % 100 mL IVPB (0 g Intravenous Stopped 09/02/20 1550)  lactated ringers bolus 1,000 mL (0 mLs Intravenous Stopped 09/02/20 1651)    And  lactated ringers bolus 1,000 mL (0 mLs Intravenous Stopped 09/02/20 1652)    And  lactated ringers bolus 500 mL (0 mLs Intravenous Stopped 09/02/20 1737)    ED Course  I have reviewed the triage vital signs and the nursing notes.  Pertinent labs  & imaging results that were available during my care of the patient were reviewed by me and considered in my medical decision making (see chart for details).    MDM Rules/Calculators/A&P  MDM:  Pt given Iv fluid bolus x 2 liters of LR. Sepsis antibiotics.  Chest xray shows viral pneumonia, possible chf.  I stopped IV fluids,  Covid ordered and is possitive.   Pt placed on 2liters 02 and oxygen is 92%  Hospitalitis consulted and will admit Final Clinical Impression(s) / ED Diagnoses Final diagnoses:  COVID-19  Syncope and collapse  Pneumonia due to COVID-19 virus    Rx / DC Orders ED Discharge Orders    None       Osie Cheeks 09/02/20 2214    Pricilla Loveless, MD 09/03/20 [phone]

## 2020-09-02 NOTE — H&P (Signed)
TRH H&P   Patient Demographics:    Zachary Mccann, is a 49 y.o. male  MRN: [id number]   DOB - Feb 08, 1971  Admit Date - 09/02/2020  Outpatient Primary MD for the patient is [name], NP  Referring MD/NP/PA: PA KAren  Patient coming from: Home  Chief Complaint  Patient presents with  . Loss of Consciousness      HPI:    Zachary Mccann  is a 49 y.o. male, with past medical history of hypertension, CVA, CHF, EF 35%, CKD stage III, morbid obesity, patient with recent hospitalization in April of this year with acute CVA with residual right-sided weakness, patient lives at home with his girlfriend Zachary Mccann, patient is awake alert and appropriate, but he is with significant dysarthria, so history was obtained with assistance with his sister, patient presents to ED secondary to episode of unresponsiveness, patient with poor appetite for last day or so, he presents with cough, reports mild dyspnea as well, he does report fever and chills, sister reports patient likely contracted Covid from girlfriend son as he came home sick before couple days, patient is unvaccinated against Covid.  -In ED work-up was significant for positive COVID-19, creatinine elevated from 1.5 baseline to 2.96 on admission, LFTs within normal limit, chest x-ray significant with multifocal opacity, patient initially started on sepsis protocol received some fluid bolus, but lactic acid came within normal limit, CRP elevated at 14.9, patient is requiring 2 L nasal cannula, [facility]  hospitalist consulted to admit for Covid.    Review of systems:    In addition to the HPI above,  Reports fever and chills, generalized weakness, poor appetite No Headache, No changes with Vision or hearing, he had an episode of unresponsiveness, he denies any new deficits. No problems swallowing food or Liquids, No Chest pain, reports cough and  some shortness of breath No Abdominal pain, No Nausea or Vommitting, Bowel movements are regular, No Blood in stool or Urine, No dysuria, No new skin rashes or bruises, No new joints pains-aches,  No new weakness, tingling, numbness in any extremity, No recent weight gain or loss, No polyuria, polydypsia or polyphagia, No significant Mental Stressors.  A full 10 point Review of Systems was done, except as stated above, all other Review of Systems were negative.   With Past History of the following :    Past Medical History:  Diagnosis Date  . CAD (coronary artery disease)    a. s/p NSTEMI in 04/2018 with DES to LCx.   [name] Hypertension   . Myocardial infarction (HCC)   . Pneumonia   . Stroke [facility])       Past Surgical History:  Procedure Laterality Date  . CORONARY STENT INTERVENTION N/A 05/05/2018   Procedure: CORONARY STENT INTERVENTION;  Surgeon: Marykay Lex, MD;  Location: [facility] INVASIVE CV LAB;  Service: Cardiovascular;  Laterality: N/A;  . LEFT HEART  CATH AND CORONARY ANGIOGRAPHY N/A 05/05/2018   Procedure: LEFT HEART CATH AND CORONARY ANGIOGRAPHY;  Surgeon: Marykay Lex, MD;  Location: [facility] INVASIVE CV LAB;  Service: Cardiovascular;  Laterality: N/A;  . TEE WITHOUT CARDIOVERSION N/A 09/08/2018   Procedure: TRANSESOPHAGEAL ECHOCARDIOGRAM (TEE);  Surgeon: Laurey Morale, MD;  Location: [facility] ENDOSCOPY;  Service: Cardiovascular;  Laterality: N/A;      Social History:     Social History   Tobacco Use  . Smoking status: Former Smoker    Types: Cigarettes  . Smokeless tobacco: Never Used  . Tobacco comment: smoked for 10-20 years at 1/2 ppd as of 2019  Substance Use Topics  . Alcohol use: Not Currently     Lives -lives at home with his girlfriend [place]    Family History :     Family History  Problem Relation Age of Onset  . Hypertension Mother   . Stroke Mother   . Dementia Mother   . Hypertension Father   . Stroke Father   . Cancer Father   .  Alcoholism Father   . Cancer Sister      Home Medications:   Prior to Admission medications   Medication Sig Start Date End Date Taking? Authorizing Provider  acetaminophen (TYLENOL) 325 MG tablet Take 2 tablets (650 mg total) by mouth every 6 (six) hours as needed for mild pain (or Fever >/= 101). 03/31/20   Angiulli, Mcarthur Rossetti, PA-C  amLODipine (NORVASC) 10 MG tablet Take 1 tablet by mouth once daily 07/11/20   Karilyn Cota, Nimish C, MD  atorvastatin (LIPITOR) 40 MG tablet TAKE 1 TABLET BY MOUTH ONCE DAILY AT  6  PM Patient taking differently: Take 40 mg by mouth daily.  07/11/20   Lilly Cove C, MD  carvedilol (COREG) 6.25 MG tablet TAKE 1 TABLET BY MOUTH TWICE DAILY WITH MEALS 08/09/20   Lilly Cove C, MD  clopidogrel (PLAVIX) 75 MG tablet Take 1 tablet (75 mg total) by mouth daily. 05/11/20   Dohmeier, Porfirio Mylar, MD  hydrALAZINE (APRESOLINE) 25 MG tablet TAKE 1 TABLET BY MOUTH THREE TIMES DAILY Patient taking differently: Take 25 mg by mouth 3 (three) times daily.  08/23/20   Wilson Singer, MD  Melatonin 10 MG TABS Take 10 mg by mouth at bedtime. 03/31/20   Angiulli, Mcarthur Rossetti, PA-C  mirtazapine (REMERON) 7.5 MG tablet Take 1 tablet (7.5 mg total) by mouth at bedtime. 03/31/20   Angiulli, Mcarthur Rossetti, PA-C  senna-docusate (SENOKOT-S) 8.6-50 MG tablet Take 2 tablets by mouth at bedtime. 10/12/19   [name], NP  traMADol (ULTRAM) 50 MG tablet Take 1 tablet (50 mg total) by mouth every 8 (eight) hours as needed for severe pain. 03/31/20   Angiulli, Mcarthur Rossetti, PA-C  amLODipine (NORVASC) 10 MG tablet Take 1 tablet (10 mg total) by mouth daily. 10/12/19   [name], NP  atorvastatin (LIPITOR) 40 MG tablet Take 1 tablet (40 mg total) by mouth daily at 6 PM. 10/12/19   [name], NP  carvedilol (COREG) 6.25 MG tablet Take 1 tablet (6.25 mg total) by mouth 2 (two) times daily with a meal. 10/12/19   [name], NP     Allergies:     Allergies  Allergen Reactions  . Aspirin Swelling      Physical Exam:   Vitals  Blood pressure (!) 101/56, pulse 98, temperature (!) 103.1 F (39.5 C), temperature source Oral, resp. rate 17, weight 126 kg, SpO2 92 %.  1. General well developed male, laying in bed, in no apparent distress  2. Normal affect and insight, Not Suicidal or Homicidal, Awake Alert, Oriented X 3.  3.  Patient with baseline right-sided hemiparesis, and with significant dysarthria .[name]  4. Ears and Eyes appear Normal, Conjunctivae clear, PERRLA. Moist Oral Mucosa.  5. Supple Neck, No JVD, No cervical lymphadenopathy appriciated, No Carotid Bruits.  6. Symmetrical Chest wall movement, Good air movement bilaterally, CTAB.  7. RRR, No Gallops, Rubs or Murmurs, No Parasternal Heave.  8. Positive Bowel Sounds, Abdomen Soft, No tenderness, No organomegaly appriciated,No rebound -guarding or rigidity.  9.  No Cyanosis, Normal Skin Turgor, No Skin Rash or Bruise.  10.   joints appear normal , no effusions  11. No Palpable Lymph Nodes in Neck or Axillae    Data Review:    CBC Recent Labs  Lab 09/02/20 1439  WBC 5.8  HGB 13.7  HCT 43.5  PLT 202  MCV 94.0  MCH 29.6  MCHC 31.5  RDW 12.6  LYMPHSABS 0.6*  MONOABS 0.4  EOSABS 0.0  BASOSABS 0.0   ------------------------------------------------------------------------------------------------------------------  Chemistries  Recent Labs  Lab 09/02/20 1439  NA 138  K 3.9  CL 103  CO2 22  GLUCOSE 115*  BUN 36*  CREATININE 2.96*  CALCIUM 8.8*  AST 18  ALT 12  ALKPHOS 72  BILITOT 0.8   ------------------------------------------------------------------------------------------------------------------ estimated creatinine clearance is 41.4 mL/min (A) (by C-G formula based on SCr of 2.96 mg/dL (H)). ------------------------------------------------------------------------------------------------------------------ No results for input(s): TSH, T4TOTAL, T3FREE, THYROIDAB in the last 72  hours.  Invalid input(s): FREET3  Coagulation profile Recent Labs  Lab 09/02/20 1439  INR 1.0   ------------------------------------------------------------------------------------------------------------------- No results for input(s): DDIMER in the last 72 hours. -------------------------------------------------------------------------------------------------------------------  Cardiac Enzymes No results for input(s): CKMB, TROPONINI, MYOGLOBIN in the last 168 hours.  Invalid input(s): CK ------------------------------------------------------------------------------------------------------------------ No results found for: BNP   ---------------------------------------------------------------------------------------------------------------  Urinalysis    Component Value Date/Time   COLORURINE YELLOW 09/02/2020 1740   APPEARANCEUR CLEAR 09/02/2020 1740   LABSPEC 1.014 09/02/2020 1740   PHURINE 5.0 09/02/2020 1740   GLUCOSEU NEGATIVE 09/02/2020 1740   HGBUR SMALL (A) 09/02/2020 1740   BILIRUBINUR NEGATIVE 09/02/2020 1740   KETONESUR NEGATIVE 09/02/2020 1740   PROTEINUR 100 (A) 09/02/2020 1740   NITRITE NEGATIVE 09/02/2020 1740   LEUKOCYTESUR NEGATIVE 09/02/2020 1740    ----------------------------------------------------------------------------------------------------------------   Imaging Results:    DG Chest Portable 1 View  Result Date: 09/02/2020 CLINICAL DATA:  Fever EXAM: PORTABLE CHEST 1 VIEW COMPARISON:  Radiograph 07/28/2019 FINDINGS: Mixed patchy and interstitial opacities are present in the lungs most focally in the bilateral lung bases and left basilar periphery as well as the right mid lung. Slight vascular congestion is noted as well with stable cardiomegaly from comparison exams. Partial obscuration of left hemidiaphragm may reflect small effusion. No right effusion or pneumothorax. Telemetry leads overlie the chest. No acute osseous or soft tissue  abnormality. Degenerative changes are present in the imaged spine and shoulders. IMPRESSION: Findings concerning for a multifocal infectious process including potential atypical infection such as COVID-19. Some additional vascular congestion in the setting of cardiomegaly with trace left effusion could reflect some superimposed edema/features of CHF. Electronically Signed   By: Kreg Shropshire M.D.   On: 09/02/2020 16:08    My personal review of EKG: Rhythm sinus tachycardia , Rate  105/min, QTc 430   Assessment & Plan:    Active Problems:   History of stroke  Uncontrolled hypertension   Morbid obesity (HCC)   Sleep disturbance   Dyslipidemia   Hyperglycemia   Dysarthria as late effect of cerebrovascular accident (CVA)   Pneumonia due to COVID-19 virus  COVID-19 for pneumonia -Patient is unvaccinated -Chest x-ray significant for multifocal opacities significant for COVID-19 pneumonia. -He is admitted under COVID-19 pathway, will check CRP, D-dimer, will trend inflammatory markers daily. -I have discussed with the patient, he was encouraged with incentive spirometry, flutter valve, and out of bed to chair. -We will start on IV steroids. -We will start on IV remdesivir. -He is with minimal oxygen requirement, no indication for Actemra or baricitinib  AKI on CKD stage III -Patient with worsening renal function, due to poor oral intake, and soft blood pressure, avoid nephrotoxic medications, received IV fluids in ED, will hold on any further fluids to avoid volume overload.  History of CVA with residual right-sided weakness -Continue with Plavix, statin -We will consult PT/OT.  Hypertension -Blood pressure on the lower side, hold Norvasc, Coreg and hydralazine  Obesity with BMI of 41  CAD with nonischemic cardiomyopathy with EF 35 to 45% - s/p NSTEMI with DES to Mid-distal LCx 100% stenosis May 2019 -Resume Plavix and statin -Resume Coreg and hydralazine once blood pressure  improves. -Received 2 L bolus in ED, monitor closely for volume overload, for now I will hold on giving Lasix given soft blood pressure and worsening renal function.   DVT Prophylaxis Heparin   AM Labs Ordered, also please review Full Orders  Family Communication: Admission, patients condition and plan of care including tests being ordered have been discussed with the patient and sister Zachary Mccann via phone  who indicate understanding and agree with the plan and Code Status.  Code Status Full  Likely DC to  Home  Condition GUARDED    Consults called:  none  Admission status: inpatient  Time spent in minutes : 60 minutes   Huey Bienenstock M.D on 09/02/2020 at 7:57 PM   [facility] - Office  [phone]

## 2020-09-02 NOTE — ED Notes (Signed)
Date and time results received: 09/02/20 5:10 PM  Test: Covid Critical Value: Positive  Name of Provider Notified: Langston Masker  Orders Received? Or Actions Taken?: See orders

## 2020-09-03 DIAGNOSIS — N1831 Chronic kidney disease, stage 3a: Secondary | ICD-10-CM

## 2020-09-03 DIAGNOSIS — U071 COVID-19: Secondary | ICD-10-CM | POA: Insufficient documentation

## 2020-09-03 DIAGNOSIS — I69322 Dysarthria following cerebral infarction: Secondary | ICD-10-CM

## 2020-09-03 DIAGNOSIS — N179 Acute kidney failure, unspecified: Secondary | ICD-10-CM

## 2020-09-03 LAB — CBC WITH DIFFERENTIAL/PLATELET
Abs Immature Granulocytes: 0.01 10*3/uL (ref 0.00–0.07)
Basophils Absolute: 0 10*3/uL (ref 0.0–0.1)
Basophils Relative: 0 %
Eosinophils Absolute: 0 10*3/uL (ref 0.0–0.5)
Eosinophils Relative: 0 %
HCT: 44.2 % (ref 39.0–52.0)
Hemoglobin: 13.8 g/dL (ref 13.0–17.0)
Immature Granulocytes: 0 %
Lymphocytes Relative: 14 %
Lymphs Abs: 0.5 10*3/uL — ABNORMAL LOW (ref 0.7–4.0)
MCH: 29.9 pg (ref 26.0–34.0)
MCHC: 31.2 g/dL (ref 30.0–36.0)
MCV: 95.9 fL (ref 80.0–100.0)
Monocytes Absolute: 0.1 10*3/uL (ref 0.1–1.0)
Monocytes Relative: 3 %
Neutro Abs: 3 10*3/uL (ref 1.7–7.7)
Neutrophils Relative %: 83 %
Platelets: 172 10*3/uL (ref 150–400)
RBC: 4.61 MIL/uL (ref 4.22–5.81)
RDW: 12.6 % (ref 11.5–15.5)
WBC: 3.6 10*3/uL — ABNORMAL LOW (ref 4.0–10.5)
nRBC: 0 % (ref 0.0–0.2)

## 2020-09-03 LAB — COMPREHENSIVE METABOLIC PANEL
ALT: 13 U/L (ref 0–44)
AST: 16 U/L (ref 15–41)
Albumin: 3.3 g/dL — ABNORMAL LOW (ref 3.5–5.0)
Alkaline Phosphatase: 68 U/L (ref 38–126)
Anion gap: 12 (ref 5–15)
BUN: 31 mg/dL — ABNORMAL HIGH (ref 6–20)
CO2: 19 mmol/L — ABNORMAL LOW (ref 22–32)
Calcium: 8.7 mg/dL — ABNORMAL LOW (ref 8.9–10.3)
Chloride: 106 mmol/L (ref 98–111)
Creatinine, Ser: 2.07 mg/dL — ABNORMAL HIGH (ref 0.61–1.24)
GFR calc Af Amer: 42 mL/min — ABNORMAL LOW (ref >60)
GFR calc non Af Amer: 37 mL/min — ABNORMAL LOW (ref >60)
Glucose, Bld: 167 mg/dL — ABNORMAL HIGH (ref 70–99)
Potassium: 4.4 mmol/L (ref 3.5–5.1)
Sodium: 137 mmol/L (ref 135–145)
Total Bilirubin: 0.7 mg/dL (ref 0.3–1.2)
Total Protein: 8.6 g/dL — ABNORMAL HIGH (ref 6.5–8.1)

## 2020-09-03 LAB — D-DIMER, QUANTITATIVE: D-Dimer, Quant: 0.82 ug/mL-FEU — ABNORMAL HIGH (ref 0.00–0.50)

## 2020-09-03 LAB — HIV ANTIBODY (ROUTINE TESTING W REFLEX): HIV Screen 4th Generation wRfx: NONREACTIVE

## 2020-09-03 LAB — C-REACTIVE PROTEIN: CRP: 14.8 mg/dL — ABNORMAL HIGH (ref <1.0)

## 2020-09-03 MED ORDER — LACTATED RINGERS IV SOLN: INTRAVENOUS | Status: DC

## 2020-09-03 NOTE — ED Notes (Signed)
Pt's brief and linen changed.

## 2020-09-03 NOTE — Progress Notes (Signed)
PROGRESS NOTE    Zachary Mccann  LZJ:[id number] DOB: Aug 09, 1971 DOA: 09/02/2020 PCP: [name], NP    Brief Narrative:  Zachary Mccann  is a 49 y.o. male, with past medical history of hypertension, CVA, CHF, EF 35%, CKD stage III, morbid obesity, patient with recent hospitalization in April of this year with acute CVA with residual right-sided weakness, patient lives at home with his girlfriend [name], patient is awake alert and appropriate, but he is with significant dysarthria, so history was obtained with assistance with his sister, patient presents to ED secondary to episode of unresponsiveness, patient with poor appetite for last day or so, he presents with cough, reports mild dyspnea as well, he does report fever and chills, sister reports patient likely contracted Covid from girlfriend son as he came home sick before couple days, patient is unvaccinated against Covid.  -In ED work-up was significant for positive COVID-19, creatinine elevated from 1.5 baseline to 2.96 on admission, LFTs within normal limit, chest x-ray significant with multifocal opacity, patient initially started on sepsis protocol received some fluid bolus, but lactic acid came within normal limit, CRP elevated at 14.9, patient is requiring 2 L nasal cannula, [facility]  hospitalist consulted to admit for Covid.   Assessment & Plan:   Active Problems:   History of stroke   Uncontrolled hypertension   Morbid obesity (HCC)   Sleep disturbance   Dyslipidemia   Hyperglycemia   Dysarthria as late effect of cerebrovascular accident (CVA)   Pneumonia due to COVID-19 virus   Pneumonia secondary to COVID-19 infection. -Patient is not vaccinated -Remdesivir day 2 of 5 -Steroids day 2 -CRP 14.9>14.8 -D dimer 0.82>1.09 -Currently on room air -Continue supportive measures  Acute kidney injury on chronic kidney disease stage IIIa -Baseline creatinine 1.6 -Creatinine on admission 2.9 -Improved with IV fluids to  2.0 -Continue IV fluids for another 12 hours.  History of CVA with residual right-sided weakness and dysarthria -Continue Plavix, statin -PT eval  Hypertension -Blood pressure running on the low side -Holding Norvasc, Coreg, hydralazine  Nonischemic cardiomyopathy, ejection fraction of 35 to 45% -Holding off on diuretics due to acute kidney injury and dehydration -Resume beta-blockers once blood pressure improves  Morbid obesity -BMI 41 -High risk for morbidity/mortality in the setting of COVID-19      DVT prophylaxis: heparin injection 5,000 Units Start: 09/02/20 2200 SCDs Start: 09/02/20 1932  Code Status: Full code Family Communication: Discussed with patient Disposition Plan: Status is: Inpatient  Remains inpatient appropriate because:IV treatments appropriate due to intensity of illness or inability to take PO   Dispo: The patient is from: Home              Anticipated d/c is to: TBD              Anticipated d/c date is: 2 days              Patient currently is not medically stable to d/c.    Consultants:     Procedures:     Antimicrobials:       Subjective: Denies any shortness of breath.  Continues to be febrile.  Objective: Vitals:   09/03/20 1424 09/03/20 1425 09/03/20 1426 09/03/20 1427  BP:      Pulse:      Resp:      Temp:      TempSrc:      SpO2: 96% 94% 96% 100%  Weight:        Intake/Output Summary (Last  24 hours) at 09/03/2020 1955 Last data filed at 09/03/2020 1137 Gross per 24 hour  Intake 590.67 ml  Output --  Net 590.67 ml   Filed Weights   09/02/20 1412  Weight: 126 kg    Examination:  General exam: Appears calm and comfortable  Respiratory system: Clear to auscultation. Respiratory effort normal. Cardiovascular system: S1 & S2 heard, RRR. No JVD, murmurs, rubs, gallops or clicks. No pedal edema. Gastrointestinal system: Abdomen is nondistended, soft and nontender. No organomegaly or masses felt. Normal bowel  sounds heard. Central nervous system: Alert and oriented.  Severely dysarthric, right-sided hemiparesis Extremities: Symmetric 5 x 5 power. Skin: No rashes, lesions or ulcers Psychiatry: Speech is difficult to comprehend    Data Reviewed: I have personally reviewed following labs and imaging studies  CBC: Recent Labs  Lab 09/02/20 1439 09/03/20 0904  WBC 5.8 3.6*  NEUTROABS 4.7 3.0  HGB 13.7 13.8  HCT 43.5 44.2  MCV 94.0 95.9  PLT 202 172   Basic Metabolic Panel: Recent Labs  Lab 09/02/20 1439 09/03/20 0904  NA 138 137  K 3.9 4.4  CL 103 106  CO2 22 19*  GLUCOSE 115* 167*  BUN 36* 31*  CREATININE 2.96* 2.07*  CALCIUM 8.8* 8.7*   GFR: Estimated Creatinine Clearance: 59.2 mL/min (A) (by C-G formula based on SCr of 2.07 mg/dL (H)). Liver Function Tests: Recent Labs  Lab 09/02/20 1439 09/03/20 0904  AST 18 16  ALT 12 13  ALKPHOS 72 68  BILITOT 0.8 0.7  PROT 9.0* 8.6*  ALBUMIN 3.6 3.3*   No results for input(s): LIPASE, AMYLASE in the last 168 hours. No results for input(s): AMMONIA in the last 168 hours. Coagulation Profile: Recent Labs  Lab 09/02/20 1439  INR 1.0   Cardiac Enzymes: No results for input(s): CKTOTAL, CKMB, CKMBINDEX, TROPONINI in the last 168 hours. BNP (last 3 results) No results for input(s): PROBNP in the last 8760 hours. HbA1C: No results for input(s): HGBA1C in the last 72 hours. CBG: No results for input(s): GLUCAP in the last 168 hours. Lipid Profile: No results for input(s): CHOL, HDL, LDLCALC, TRIG, CHOLHDL, LDLDIRECT in the last 72 hours. Thyroid Function Tests: No results for input(s): TSH, T4TOTAL, FREET4, T3FREE, THYROIDAB in the last 72 hours. Anemia Panel: No results for input(s): VITAMINB12, FOLATE, FERRITIN, TIBC, IRON, RETICCTPCT in the last 72 hours. Sepsis Labs: Recent Labs  Lab 09/02/20 1439 09/02/20 1803  LATICACIDVEN 1.2 0.6    Recent Results (from the past 240 hour(s))  Culture, blood (x 2)      Status: None (Preliminary result)   Collection Time: 09/02/20  2:39 PM   Specimen: BLOOD LEFT HAND  Result Value Ref Range Status   Specimen Description BLOOD LEFT HAND  Final   Special Requests   Final    BOTTLES DRAWN AEROBIC AND ANAEROBIC Blood Culture adequate volume   Culture   Final    NO GROWTH < 24 HOURS Performed at [facility], [address] [place], [place] [address]    Report Status PENDING  Incomplete  SARS Coronavirus 2 by RT PCR (hospital order, performed in [facility] hospital lab) Nasopharyngeal Nasopharyngeal Swab     Status: Abnormal   Collection Time: 09/02/20  2:42 PM   Specimen: Nasopharyngeal Swab  Result Value Ref Range Status   SARS Coronavirus 2 POSITIVE (A) NEGATIVE Final    Comment: RESULT CALLED TO, READ BACK BY AND VERIFIED WITH: OAKLEY @ 1708 ON [id number] BY HENDERSON L (NOTE)  SARS-CoV-2 target nucleic acids are DETECTED  SARS-CoV-2 RNA is generally detectable in upper respiratory specimens  during the acute phase of infection.  Positive results are indicative  of the presence of the identified virus, but do not rule out bacterial infection or co-infection with other pathogens not detected by the test.  Clinical correlation with patient history and  other diagnostic information is necessary to determine patient infection status.  The expected result is negative.  Fact Sheet for Patients:   BoilerBrush.com.cy   Fact Sheet for Healthcare Providers:   https://pope.com/    This test is not yet approved or cleared by the [place] FDA and  has been authorized for detection and/or diagnosis of SARS-CoV-2 by FDA under an Emergency Use Authorization (EUA).  This EUA will remain in effect (meaning thi s test can be used) for the duration of  the COVID-19 declaration under Section 564(b)(1) of the Act, 21 U.S.C. section 360-bbb-3(b)(1), unless the authorization is terminated or revoked  sooner.  Performed at [facility], [address] [place], [place] [address]   Culture, blood (x 2)     Status: None (Preliminary result)   Collection Time: 09/02/20  2:56 PM   Specimen: BLOOD LEFT HAND  Result Value Ref Range Status   Specimen Description BLOOD LEFT HAND  Final   Special Requests   Final    BOTTLES DRAWN AEROBIC AND ANAEROBIC Blood Culture results may not be optimal due to an inadequate volume of blood received in culture bottles   Culture   Final    NO GROWTH < 24 HOURS Performed at [facility], [address], [place], [place] [address]    Report Status PENDING  Incomplete         Radiology Studies: DG Chest Portable 1 View  Result Date: 09/02/2020 CLINICAL DATA:  Fever EXAM: PORTABLE CHEST 1 VIEW COMPARISON:  Radiograph 07/28/2019 FINDINGS: Mixed patchy and interstitial opacities are present in the lungs most focally in the bilateral lung bases and left basilar periphery as well as the right mid lung. Slight vascular congestion is noted as well with stable cardiomegaly from comparison exams. Partial obscuration of left hemidiaphragm may reflect small effusion. No right effusion or pneumothorax. Telemetry leads overlie the chest. No acute osseous or soft tissue abnormality. Degenerative changes are present in the imaged spine and shoulders. IMPRESSION: Findings concerning for a multifocal infectious process including potential atypical infection such as COVID-19. Some additional vascular congestion in the setting of cardiomegaly with trace left effusion could reflect some superimposed edema/features of CHF. Electronically Signed   By: Kreg Shropshire M.D.   On: 09/02/2020 16:08        Scheduled Meds: . atorvastatin  40 mg Oral Daily  . clopidogrel  75 mg Oral Daily  . heparin  5,000 Units Subcutaneous Q8H  . mirtazapine  7.5 mg Oral QHS  . pantoprazole  40 mg Oral Daily  . [START ON 09/05/2020] predniSONE  50 mg Oral Daily   Continuous Infusions: .  methylPREDNISolone (SOLU-MEDROL) injection Stopped (09/03/20 1137)  . remdesivir 100 mg in NS 100 mL Stopped (09/03/20 1137)     LOS: 1 day    Time spent: 35 mins    Erick Blinks, MD [facility]   If 7PM-7AM, please contact night-coverage [email or web address]  09/03/2020, 7:55 PM

## 2020-09-03 NOTE — ED Notes (Signed)
Pt took meds whole without any issues. Thin liquids without coughing.

## 2020-09-03 NOTE — TOC Initial Note (Signed)
Transition of Care [facility]) - Initial/Assessment Note    Patient Details  Name: Zachary Mccann MRN: [id number] Date of Birth: 02/04/1971  [facility] of [facility]) CM/SW Contact:    Barry Brunner, LCSW Phone Number: 09/03/2020, 2:16 PM  Clinical Narrative:                 Patient is a 49 year old male admitted for, Dysarthria as late effect of cerebrovascular accident (CVA) and  Pneumonia due to COVID-19 virus. CSW contacted patient's niece for assessment. Patient admitted with difficulty speaking due to previous stroke. Patient's niece reported that patient was previously receiving OPPT and previously refused SNF. Patient's niece reported that his decision might change due to the current hospitialition. Patient's sister reported that they are agreeable to [facility] and [facility] referrals, and would discuss referrals to additional SNF's. Patient's sister also reported that they are not agreeable to a referral to [facility]. TOC to follow.     Barriers to Discharge: Continued Medical Work up   Patient Goals and CMS Choice        Expected Discharge Plan and Services         Living arrangements for the past 2 months: Single Family Home                                      Prior Living Arrangements/Services Living arrangements for the past 2 months: Single Family Home Lives with:: Self, Spouse   Do you feel safe going back to the place where you live?: Yes          Current home services: DME (In OPPT, Walker, Wheelchair, standing walker)    Activities of Daily Living      Permission Sought/Granted Permission sought to share information with : Family Supports, PCP, Oceanographer granted to share information with : Yes, Verbal Permission Granted              Emotional Assessment              Admission diagnosis:  Pneumonia due to COVID-19 virus [U07.1, J12.82] Patient Active Problem List   Diagnosis Date Noted  .  Pneumonia due to COVID-19 virus 09/02/2020  . Acute ischemic left middle cerebral artery (MCA) stroke (HCC) 05/11/2020  . Cerebral infarction, watershed distribution, unilateral, acute (HCC) 05/11/2020  . Dysarthria as late effect of cerebrovascular accident (CVA) 05/11/2020  . Visual field constriction, bilateral 05/11/2020  . Hemiparesis affecting right side as late effect of cerebrovascular accident (HCC) 05/11/2020  . Dysarthria, post-stroke 04/07/2020  . 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  . Hyperglycemia   . Anemia   . Acute pain of right knee   . Primary osteoarthritis of right knee   . Expressive language impairment   . Benign essential HTN   . Acute bilat watershed infarction [facility]) 03/16/2020  . Cerebral infarction, watershed distribution, unilateral, chronic 03/16/2020  . Acute renal failure (HCC)   . History of CVA in adulthood   . Dyslipidemia   . CVA (cerebral vascular accident) (HCC) 03/11/2020  . Leukocytosis   . Hemiparesis affecting dominant side as late effect of stroke (HCC)   . Labile blood pressure   . Sleep disturbance   . Benign hypertensive heart and kidney disease with diastolic CHF, NYHA class II and CKD stage III (HCC)   . Chronic systolic congestive heart failure (HCC)   . Morbid obesity (  HCC)   . Uncontrolled hypertension   . Dysphagia, post-stroke   . Cardiomegaly   . Acute systolic congestive heart failure (HCC)   . 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 Cerebral embolism with cerebral infarction 07/11/2019  . Acute CVA (cerebrovascular accident) (HCC) 07/11/2019  . Slurred speech 07/10/2019  . Cardiomyopathy (HCC) 11/20/2018  . History of stroke 09/06/2018  . Tobacco use 09/06/2018  . History of non-ST elevation myocardial infarction (NSTEMI) 05/05/2018  . Severe Vitamin D deficiency 04/02/2018  . Community acquired pneumonia of left lower lobe of lung 04/02/2018  . CKD (chronic kidney disease), stage III (HCC) = Secondary to  Hypertension 04/02/2018  . Pneumonia 03/30/2018  . Metabolic acidosis 03/30/2018  . AKI (acute kidney injury) (HCC) 03/30/2018  . N&V (nausea and vomiting) 03/30/2018  . Essential hypertension 03/30/2018   PCP:  [name], NP Pharmacy:   [facility] [phone] - [place], [place] - [address] AT [facility] [address] [place] [address] Phone: [phone] Fax: [phone]  [facility] [address], [place] - [address] [place] [address] [place] [address] [place] [address] Phone: [phone] Fax: [phone]     Social Determinants of Health (SDOH) Interventions    Readmission Risk Interventions No flowsheet data found.

## 2020-09-04 DIAGNOSIS — J1282 Pneumonia due to coronavirus disease 2019: Secondary | ICD-10-CM

## 2020-09-04 LAB — COMPREHENSIVE METABOLIC PANEL
ALT: 11 U/L (ref 0–44)
AST: 17 U/L (ref 15–41)
Albumin: 3 g/dL — ABNORMAL LOW (ref 3.5–5.0)
Alkaline Phosphatase: 63 U/L (ref 38–126)
Anion gap: 13 (ref 5–15)
BUN: 33 mg/dL — ABNORMAL HIGH (ref 6–20)
CO2: 17 mmol/L — ABNORMAL LOW (ref 22–32)
Calcium: 8.5 mg/dL — ABNORMAL LOW (ref 8.9–10.3)
Chloride: 106 mmol/L (ref 98–111)
Creatinine, Ser: 1.84 mg/dL — ABNORMAL HIGH (ref 0.61–1.24)
GFR calc Af Amer: 49 mL/min — ABNORMAL LOW (ref >60)
GFR calc non Af Amer: 42 mL/min — ABNORMAL LOW (ref >60)
Glucose, Bld: 247 mg/dL — ABNORMAL HIGH (ref 70–99)
Potassium: 4.6 mmol/L (ref 3.5–5.1)
Sodium: 136 mmol/L (ref 135–145)
Total Bilirubin: 0.6 mg/dL (ref 0.3–1.2)
Total Protein: 7.8 g/dL (ref 6.5–8.1)

## 2020-09-04 LAB — CBC WITH DIFFERENTIAL/PLATELET
Abs Immature Granulocytes: 0.03 10*3/uL (ref 0.00–0.07)
Basophils Absolute: 0 10*3/uL (ref 0.0–0.1)
Basophils Relative: 0 %
Eosinophils Absolute: 0 10*3/uL (ref 0.0–0.5)
Eosinophils Relative: 0 %
HCT: 39.8 % (ref 39.0–52.0)
Hemoglobin: 12.7 g/dL — ABNORMAL LOW (ref 13.0–17.0)
Immature Granulocytes: 1 %
Lymphocytes Relative: 10 %
Lymphs Abs: 0.7 10*3/uL (ref 0.7–4.0)
MCH: 30.7 pg (ref 26.0–34.0)
MCHC: 31.9 g/dL (ref 30.0–36.0)
MCV: 96.1 fL (ref 80.0–100.0)
Monocytes Absolute: 0.4 10*3/uL (ref 0.1–1.0)
Monocytes Relative: 6 %
Neutro Abs: 5.3 10*3/uL (ref 1.7–7.7)
Neutrophils Relative %: 83 %
Platelets: 172 10*3/uL (ref 150–400)
RBC: 4.14 MIL/uL — ABNORMAL LOW (ref 4.22–5.81)
RDW: 12.7 % (ref 11.5–15.5)
WBC Morphology: INCREASED
WBC: 6.4 10*3/uL (ref 4.0–10.5)
nRBC: 0 % (ref 0.0–0.2)

## 2020-09-04 LAB — C-REACTIVE PROTEIN: CRP: 6.6 mg/dL — ABNORMAL HIGH (ref <1.0)

## 2020-09-04 LAB — D-DIMER, QUANTITATIVE: D-Dimer, Quant: 0.48 ug/mL-FEU (ref 0.00–0.50)

## 2020-09-04 MED ORDER — GUAIFENESIN ER 600 MG PO TB12: 600.0000 mg | ORAL_TABLET | Freq: Two times a day (BID) | ORAL | 2 refills | Status: AC

## 2020-09-04 MED ORDER — PREDNISONE 50 MG PO TABS
50.0000 mg | ORAL_TABLET | Freq: Every day | ORAL | 0 refills | Status: DC
Start: 2020-09-04 — End: 2021-01-05

## 2020-09-04 MED ORDER — ALBUTEROL SULFATE HFA 108 (90 BASE) MCG/ACT IN AERS: 2.0000 | INHALATION_SPRAY | Freq: Four times a day (QID) | RESPIRATORY_TRACT | 2 refills | Status: AC | PRN

## 2020-09-04 NOTE — ED Notes (Signed)
Entered room and introduced self to patient. At this time, patient provided with phone for communication with family. Pt educated that this RN will return, and privacy provided at this time. Pt verbalized understanding and is in agreement at this time.

## 2020-09-04 NOTE — Discharge Instructions (Signed)
Patient scheduled for outpatient Remdesivir infusions at 12:30 on Monday, 9/20 and 12:30 on Tuesday 9/21. Please inform the patient to park at [address] [place], [place], as staff will be escorting the patient through the east entrance of the hospital. Appointments take approximately 45 minutes.    There is a wave flag banner located near the entrance on [address]. Turn into this entrance and immediately turn left and park in 1 of the 5 designated Covid Infusion Parking spots. There is a phone number on the sign, please call and let the staff know what spot you are in and we will come out and get you. For questions call [phone].  Thanks.  Arrangements are being made for transportation to the infusion clinic on 09/06/20. You will be responsible for transportation to the clinic on 09/05/20  You will need to quarantine until 09/23/20    COVID-19 COVID-19 is a respiratory infection that is caused by a virus called severe acute respiratory syndrome coronavirus 2 (SARS-CoV-2). The disease is also known as coronavirus disease or novel coronavirus. In some people, the virus may not cause any symptoms. In others, it may cause a serious infection. The infection can get worse quickly and can lead to complications, such as:  Pneumonia, or infection of the lungs.  Acute respiratory distress syndrome or ARDS. This is a condition in which fluid build-up in the lungs prevents the lungs from filling with air and passing oxygen into the blood.  Acute respiratory failure. This is a condition in which there is not enough oxygen passing from the lungs to the body or when carbon dioxide is not passing from the lungs out of the body.  Sepsis or septic shock. This is a serious bodily reaction to an infection.  Blood clotting problems.  Secondary infections due to bacteria or fungus.  Organ failure. This is when your body's organs stop working. The virus that causes COVID-19 is contagious. This means that it  can spread from person to person through droplets from coughs and sneezes (respiratory secretions). What are the causes? This illness is caused by a virus. You may catch the virus by:  Breathing in droplets from an infected person. Droplets can be spread by a person breathing, speaking, singing, coughing, or sneezing.  Touching something, like a table or a doorknob, that was exposed to the virus (contaminated) and then touching your mouth, nose, or eyes. What increases the risk? Risk for infection You are more likely to be infected with this virus if you:  Are within 6 feet (2 meters) of a person with COVID-19.  Provide care for or live with a person who is infected with COVID-19.  Spend time in crowded indoor spaces or live in shared housing. Risk for serious illness You are more likely to become seriously ill from the virus if you:  Are 69 years of age or older. The higher your age, the more you are at risk for serious illness.  Live in a nursing home or long-term care facility.  Have cancer.  Have a long-term (chronic) disease such as: ? Chronic lung disease, including chronic obstructive pulmonary disease or asthma. ? A long-term disease that lowers your body's ability to fight infection (immunocompromised). ? Heart disease, including heart failure, a condition in which the arteries that lead to the heart become narrow or blocked (coronary artery disease), a disease which makes the heart muscle thick, weak, or stiff (cardiomyopathy). ? Diabetes. ? Chronic kidney disease. ? Sickle cell disease,  a condition in which red blood cells have an abnormal "sickle" shape. ? Liver disease.  Are obese. What are the signs or symptoms? Symptoms of this condition can range from mild to severe. Symptoms may appear any time from 2 to 14 days after being exposed to the virus. They include:  A fever or chills.  A cough.  Difficulty breathing.  Headaches, body aches, or muscle  aches.  Runny or stuffy (congested) nose.  A sore throat.  New loss of taste or smell. Some people may also have stomach problems, such as nausea, vomiting, or diarrhea. Other people may not have any symptoms of COVID-19. How is this diagnosed? This condition may be diagnosed based on:  Your signs and symptoms, especially if: ? You live in an area with a COVID-19 outbreak. ? You recently traveled to or from an area where the virus is common. ? You provide care for or live with a person who was diagnosed with COVID-19. ? You were exposed to a person who was diagnosed with COVID-19.  A physical exam.  Lab tests, which may include: ? Taking a sample of fluid from the back of your nose and throat (nasopharyngeal fluid), your nose, or your throat using a swab. ? A sample of mucus from your lungs (sputum). ? Blood tests.  Imaging tests, which may include, X-rays, CT scan, or ultrasound. How is this treated? At present, there is no medicine to treat COVID-19. Medicines that treat other diseases are being used on a trial basis to see if they are effective against COVID-19. Your health care provider will talk with you about ways to treat your symptoms. For most people, the infection is mild and can be managed at home with rest, fluids, and over-the-counter medicines. Treatment for a serious infection usually takes places in a hospital intensive care unit (ICU). It may include one or more of the following treatments. These treatments are given until your symptoms improve.  Receiving fluids and medicines through an IV.  Supplemental oxygen. Extra oxygen is given through a tube in the nose, a face mask, or a hood.  Positioning you to lie on your stomach (prone position). This makes it easier for oxygen to get into the lungs.  Continuous positive airway pressure (CPAP) or bi-level positive airway pressure (BPAP) machine. This treatment uses mild air pressure to keep the airways open. A tube  that is connected to a motor delivers oxygen to the body.  Ventilator. This treatment moves air into and out of the lungs by using a tube that is placed in your windpipe.  Tracheostomy. This is a procedure to create a hole in the neck so that a breathing tube can be inserted.  Extracorporeal membrane oxygenation (ECMO). This procedure gives the lungs a chance to recover by taking over the functions of the heart and lungs. It supplies oxygen to the body and removes carbon dioxide. Follow these instructions at home: Lifestyle  If you are sick, stay home except to get medical care. Your health care provider will tell you how long to stay home. Call your health care provider before you go for medical care.  Rest at home as told by your health care provider.  Do not use any products that contain nicotine or tobacco, such as cigarettes, e-cigarettes, and chewing tobacco. If you need help quitting, ask your health care provider.  Return to your normal activities as told by your health care provider. Ask your health care provider what activities are safe  for you. General instructions  Take over-the-counter and prescription medicines only as told by your health care provider.  Drink enough fluid to keep your urine pale yellow.  Keep all follow-up visits as told by your health care provider. This is important. How is this prevented?  There is no vaccine to help prevent COVID-19 infection. However, there are steps you can take to protect yourself and others from this virus. To protect yourself:   Do not travel to areas where COVID-19 is a risk. The areas where COVID-19 is reported change often. To identify high-risk areas and travel restrictions, check the CDC travel website: StageSync.si  If you live in, or must travel to, an area where COVID-19 is a risk, take precautions to avoid infection. ? Stay away from people who are sick. ? Wash your hands often with soap and water for  20 seconds. If soap and water are not available, use an alcohol-based hand sanitizer. ? Avoid touching your mouth, face, eyes, or nose. ? Avoid going out in public, follow guidance from your state and local health authorities. ? If you must go out in public, wear a cloth face covering or face mask. Make sure your mask covers your nose and mouth. ? Avoid crowded indoor spaces. Stay at least 6 feet (2 meters) away from others. ? Disinfect objects and surfaces that are frequently touched every day. This may include:  Counters and tables.  Doorknobs and light switches.  Sinks and faucets.  Electronics, such as phones, remote controls, keyboards, computers, and tablets. To protect others: If you have symptoms of COVID-19, take steps to prevent the virus from spreading to others.  If you think you have a COVID-19 infection, contact your health care provider right away. Tell your health care team that you think you may have a COVID-19 infection.  Stay home. Leave your house only to seek medical care. Do not use public transport.  Do not travel while you are sick.  Wash your hands often with soap and water for 20 seconds. If soap and water are not available, use alcohol-based hand sanitizer.  Stay away from other members of your household. Let healthy household members care for children and pets, if possible. If you have to care for children or pets, wash your hands often and wear a mask. If possible, stay in your own room, separate from others. Use a different bathroom.  Make sure that all people in your household wash their hands well and often.  Cough or sneeze into a tissue or your sleeve or elbow. Do not cough or sneeze into your hand or into the air.  Wear a cloth face covering or face mask. Make sure your mask covers your nose and mouth. Where to find more information  Centers for Disease Control and Prevention: StickerEmporium.tn  World Health  Organization: https://thompson-craig.com/ Contact a health care provider if:  You live in or have traveled to an area where COVID-19 is a risk and you have symptoms of the infection.  You have had contact with someone who has COVID-19 and you have symptoms of the infection. Get help right away if:  You have trouble breathing.  You have pain or pressure in your chest.  You have confusion.  You have bluish lips and fingernails.  You have difficulty waking from sleep.  You have symptoms that get worse. These symptoms may represent a serious problem that is an emergency. Do not wait to see if the symptoms will go away. Get medical  help right away. Call your local emergency services (911 in the [place] Do not drive yourself to the hospital. Let the emergency medical personnel know if you think you have COVID-19. Summary  COVID-19 is a respiratory infection that is caused by a virus. It is also known as coronavirus disease or novel coronavirus. It can cause serious infections, such as pneumonia, acute respiratory distress syndrome, acute respiratory failure, or sepsis.  The virus that causes COVID-19 is contagious. This means that it can spread from person to person through droplets from breathing, speaking, singing, coughing, or sneezing.  You are more likely to develop a serious illness if you are 21 years of age or older, have a weak immune system, live in a nursing home, or have chronic disease.  There is no medicine to treat COVID-19. Your health care provider will talk with you about ways to treat your symptoms.  Take steps to protect yourself and others from infection. Wash your hands often and disinfect objects and surfaces that are frequently touched every day. Stay away from people who are sick and wear a mask if you are sick. This information is not intended to replace advice given to you by your health care provider. Make sure you discuss any questions you have with your  health care provider. Document Revised: 10/02/2019 Document Reviewed: 01/08/2019 Elsevier Patient Education  2020 [employer].

## 2020-09-04 NOTE — Progress Notes (Signed)
Patient scheduled for outpatient Remdesivir infusions at 12:30 on Monday, 9/20 and 12:30 on Tuesday 9/21. Please inform the patient to park at [address] [place], [place], as staff will be escorting the patient through the east entrance of the hospital. Appointments take approximately 45 minutes.    There is a wave flag banner located near the entrance on [address]. Turn into this entrance and immediately turn left and park in 1 of the 5 designated Covid Infusion Parking spots. There is a phone number on the sign, please call and let the staff know what spot you are in and we will come out and get you. For questions call [phone].  Thanks.

## 2020-09-04 NOTE — ED Notes (Signed)
Dr. Kerry Hough at bedside to speak with patient at this time.

## 2020-09-04 NOTE — Discharge Summary (Signed)
Physician Discharge Summary  Zachary Mccann EXB:[id number] DOB: Aug 24, 1971 DOA: 09/02/2020  PCP: [name], NP  Admit date: 09/02/2020 Discharge date: 09/04/2020  Admitted From: home Disposition:  home  Recommendations for Outpatient Follow-up:  1. Follow up with PCP in 1-2 weeks 2. Please obtain BMP/CBC in one week 3. Patient has been set up for outpatient remdesivir clinic on 9/20 and 9/21 4. He will need to quarantine for total of 21 days   Discharge Condition: Stable CODE STATUS: Full code Diet recommendation: Heart healthy  Brief/Interim Summary: Zachary Mccann a49 y.o.male,with past medical history of hypertension, CVA, CHF, EF 35%, CKD stage III, morbid obesity, patient with recent hospitalization in April of this year with acute CVA with residual right-sided weakness, patient lives at home with his girlfriend [name], patient is awake alert and appropriate, but he is with significant dysarthria, so history was obtained with assistance with his sister,patient presents to ED secondary to episode of unresponsiveness, patient with poor appetite for last day or so, he presents with cough, reports mild dyspnea as well, he does report fever and chills, sister reports patient likely contracted Covid from girlfriend son as he came home sick before couple days, patient is unvaccinated against Covid.  -In ED work-up was significant for positive COVID-19, creatinine elevated from 1.5 baseline to 2.96 on admission, LFTs within normal limit, chest x-ray significant with multifocal opacity, patient initially started on sepsis protocol received some fluid bolus, but lactic acid came within normal limit, CRP elevated at 14.9, patient is requiring 2 L nasal cannula,TRiadhospitalist consulted to admit for Covid.  Discharge Diagnoses:  Active Problems:   History of stroke   Uncontrolled hypertension   Morbid obesity (HCC)   Sleep disturbance   Dyslipidemia   Hyperglycemia   Dysarthria  as late effect of cerebrovascular accident (CVA)   Pneumonia due to COVID-19 virus  Pneumonia secondary to COVID-19 infection. -Patient is not vaccinated -Remdesivir day 3 of 5, remaining 2 doses set up as an outpatient -Steroids day 3, transition to oral steroids to complete course -CRP 14.9>14.8 -D dimer 0.82>1.09 -Currently on room air -Continue supportive measures  Acute kidney injury on chronic kidney disease stage IIIa -Baseline creatinine 1.6 -Creatinine on admission 2.9 -Improved with IV fluids to 1.8   History of CVA with residual right-sided weakness and dysarthria -Continue Plavix, statin -Patient is wheelchair dependent and feels as though he is back to baseline  Hypertension -Blood pressure improved with IV fluids -Resume Coreg and hydralazine on discharge -Discontinue Norvasc in light of low EF  Nonischemic cardiomyopathy, ejection fraction of 35 to 45% -Holding off on diuretics due to acute kidney injury and dehydration -Resume beta-blockers on discharge  Morbid obesity -BMI 41 -High risk for morbidity/mortality in the setting of COVID-19  Discharge Instructions  Discharge Instructions    Diet - low sodium heart healthy   Complete by: As directed    Increase activity slowly   Complete by: As directed      Allergies as of 09/04/2020      Reactions   Aspirin Swelling      Medication List    STOP taking these medications   amLODipine 10 MG tablet Commonly known as: NORVASC   traMADol 50 MG tablet Commonly known as: ULTRAM     TAKE these medications   acetaminophen 325 MG tablet Commonly known as: TYLENOL Take 2 tablets (650 mg total) by mouth every 6 (six) hours as needed for mild pain (or Fever >/= 101).   albuterol  108 (90 Base) MCG/ACT inhaler Commonly known as: VENTOLIN HFA Inhale 2 puffs into the lungs every 6 (six) hours as needed for wheezing or shortness of breath.   atorvastatin 40 MG tablet Commonly known as: LIPITOR TAKE  1 TABLET BY MOUTH ONCE DAILY AT  6  PM What changed:   how much to take  how to take this  when to take this  additional instructions   carvedilol 6.25 MG tablet Commonly known as: COREG TAKE 1 TABLET BY MOUTH TWICE DAILY WITH MEALS   clopidogrel 75 MG tablet Commonly known as: PLAVIX Take 1 tablet (75 mg total) by mouth daily.   guaiFENesin 600 MG 12 hr tablet Commonly known as: Mucinex Take 1 tablet (600 mg total) by mouth 2 (two) times daily.   hydrALAZINE 25 MG tablet Commonly known as: APRESOLINE TAKE 1 TABLET BY MOUTH THREE TIMES DAILY   Melatonin 10 MG Tabs Take 10 mg by mouth at bedtime.   mirtazapine 7.5 MG tablet Commonly known as: REMERON Take 1 tablet (7.5 mg total) by mouth at bedtime.   predniSONE 50 MG tablet Commonly known as: DELTASONE Take 1 tablet (50 mg total) by mouth daily with breakfast.   senna-docusate 8.6-50 MG tablet Commonly known as: Senokot-S Take 2 tablets by mouth at bedtime.       Allergies  Allergen Reactions  . Aspirin Swelling    Consultations:     Procedures/Studies: DG Chest Portable 1 View  Result Date: 09/02/2020 CLINICAL DATA:  Fever EXAM: PORTABLE CHEST 1 VIEW COMPARISON:  Radiograph 07/28/2019 FINDINGS: Mixed patchy and interstitial opacities are present in the lungs most focally in the bilateral lung bases and left basilar periphery as well as the right mid lung. Slight vascular congestion is noted as well with stable cardiomegaly from comparison exams. Partial obscuration of left hemidiaphragm may reflect small effusion. No right effusion or pneumothorax. Telemetry leads overlie the chest. No acute osseous or soft tissue abnormality. Degenerative changes are present in the imaged spine and shoulders. IMPRESSION: Findings concerning for a multifocal infectious process including potential atypical infection such as COVID-19. Some additional vascular congestion in the setting of cardiomegaly with trace left effusion  could reflect some superimposed edema/features of CHF. Electronically Signed   By: Kreg Shropshire M.D.   On: 09/02/2020 16:08       Subjective: Feeling better today.  No shortness of breath.  Wants to go home.  Discharge Exam: Vitals:   09/04/20 1013 09/04/20 1015 09/04/20 1311 09/04/20 1604  BP:  (!) 118/103 (!) 131/92 (!) 139/102  Pulse:  76 77 82  Resp:   16 19  Temp: 98.5 F (36.9 C)   98 F (36.7 C)  TempSrc: Oral   Axillary  SpO2:  93% 97% 97%  Weight:        General: Pt is alert, awake, not in acute distress Cardiovascular: RRR, S1/S2 +, no rubs, no gallops Respiratory: CTA bilaterally, no wheezing, no rhonchi Abdominal: Soft, NT, ND, bowel sounds + Extremities: no edema, no cyanosis    The results of significant diagnostics from this hospitalization (including imaging, microbiology, ancillary and laboratory) are listed below for reference.     Microbiology: Recent Results (from the past 240 hour(s))  Culture, blood (x 2)     Status: None (Preliminary result)   Collection Time: 09/02/20  2:39 PM   Specimen: BLOOD LEFT HAND  Result Value Ref Range Status   Specimen Description BLOOD LEFT HAND  Final   Special Requests  Final    BOTTLES DRAWN AEROBIC AND ANAEROBIC Blood Culture adequate volume   Culture   Final    NO GROWTH 2 DAYS Performed at [facility], [address], [place], [place] [address]    Report Status PENDING  Incomplete  SARS Coronavirus 2 by RT PCR (hospital order, performed in [facility] hospital lab) Nasopharyngeal Nasopharyngeal Swab     Status: Abnormal   Collection Time: 09/02/20  2:42 PM   Specimen: Nasopharyngeal Swab  Result Value Ref Range Status   SARS Coronavirus 2 POSITIVE (A) NEGATIVE Final    Comment: RESULT CALLED TO, READ BACK BY AND VERIFIED WITH: OAKLEY @ 1708 ON [id number] BY HENDERSON L (NOTE) SARS-CoV-2 target nucleic acids are DETECTED  SARS-CoV-2 RNA is generally detectable in upper respiratory specimens  during the  acute phase of infection.  Positive results are indicative  of the presence of the identified virus, but do not rule out bacterial infection or co-infection with other pathogens not detected by the test.  Clinical correlation with patient history and  other diagnostic information is necessary to determine patient infection status.  The expected result is negative.  Fact Sheet for Patients:   BoilerBrush.com.cy   Fact Sheet for Healthcare Providers:   https://pope.com/    This test is not yet approved or cleared by the [place] FDA and  has been authorized for detection and/or diagnosis of SARS-CoV-2 by FDA under an Emergency Use Authorization (EUA).  This EUA will remain in effect (meaning thi s test can be used) for the duration of  the COVID-19 declaration under Section 564(b)(1) of the Act, 21 U.S.C. section 360-bbb-3(b)(1), unless the authorization is terminated or revoked sooner.  Performed at [facility], [address], [place], [place] [address]   Culture, blood (x 2)     Status: None (Preliminary result)   Collection Time: 09/02/20  2:56 PM   Specimen: BLOOD LEFT HAND  Result Value Ref Range Status   Specimen Description BLOOD LEFT HAND  Final   Special Requests   Final    BOTTLES DRAWN AEROBIC AND ANAEROBIC Blood Culture results may not be optimal due to an inadequate volume of blood received in culture bottles   Culture   Final    NO GROWTH 2 DAYS Performed at [facility], [address] [place], [place] [address]    Report Status PENDING  Incomplete     Labs: BNP (last 3 results) No results for input(s): BNP in the last 8760 hours. Basic Metabolic Panel: Recent Labs  Lab 09/02/20 1439 09/03/20 0904 09/04/20 0645  NA 138 137 136  K 3.9 4.4 4.6  CL 103 106 106  CO2 22 19* 17*  GLUCOSE 115* 167* 247*  BUN 36* 31* 33*  CREATININE 2.96* 2.07* 1.84*  CALCIUM 8.8* 8.7* 8.5*   Liver Function Tests: Recent  Labs  Lab 09/02/20 1439 09/03/20 0904 09/04/20 0645  AST 18 16 17   ALT 12 13 11   ALKPHOS 72 68 63  BILITOT 0.8 0.7 0.6  PROT 9.0* 8.6* 7.8  ALBUMIN 3.6 3.3* 3.0*   No results for input(s): LIPASE, AMYLASE in the last 168 hours. No results for input(s): AMMONIA in the last 168 hours. CBC: Recent Labs  Lab 09/02/20 1439 09/03/20 0904 09/04/20 0645  WBC 5.8 3.6* 6.4  NEUTROABS 4.7 3.0 5.3  HGB 13.7 13.8 12.7*  HCT 43.5 44.2 39.8  MCV 94.0 95.9 96.1  PLT 202 172 172   Cardiac Enzymes: No results for input(s):  CKTOTAL, CKMB, CKMBINDEX, TROPONINI in the last 168 hours. BNP: Invalid input(s): POCBNP CBG: No results for input(s): GLUCAP in the last 168 hours. D-Dimer Recent Labs    09/03/20 0904 09/04/20 0645  DDIMER 0.82* 0.48   Hgb A1c No results for input(s): HGBA1C in the last 72 hours. Lipid Profile No results for input(s): CHOL, HDL, LDLCALC, TRIG, CHOLHDL, LDLDIRECT in the last 72 hours. Thyroid function studies No results for input(s): TSH, T4TOTAL, T3FREE, THYROIDAB in the last 72 hours.  Invalid input(s): FREET3 Anemia work up No results for input(s): VITAMINB12, FOLATE, FERRITIN, TIBC, IRON, RETICCTPCT in the last 72 hours. Urinalysis    Component Value Date/Time   COLORURINE YELLOW 09/02/2020 1740   APPEARANCEUR CLEAR 09/02/2020 1740   LABSPEC 1.014 09/02/2020 1740   PHURINE 5.0 09/02/2020 1740   GLUCOSEU NEGATIVE 09/02/2020 1740   HGBUR SMALL (A) 09/02/2020 1740   BILIRUBINUR NEGATIVE 09/02/2020 1740   KETONESUR NEGATIVE 09/02/2020 1740   PROTEINUR 100 (A) 09/02/2020 1740   NITRITE NEGATIVE 09/02/2020 1740   LEUKOCYTESUR NEGATIVE 09/02/2020 1740   Sepsis Labs Invalid input(s): PROCALCITONIN,  WBC,  LACTICIDVEN Microbiology Recent Results (from the past 240 hour(s))  Culture, blood (x 2)     Status: None (Preliminary result)   Collection Time: 09/02/20  2:39 PM   Specimen: BLOOD LEFT HAND  Result Value Ref Range Status   Specimen  Description BLOOD LEFT HAND  Final   Special Requests   Final    BOTTLES DRAWN AEROBIC AND ANAEROBIC Blood Culture adequate volume   Culture   Final    NO GROWTH 2 DAYS Performed at [facility], [address], [place], [place] [address]    Report Status PENDING  Incomplete  SARS Coronavirus 2 by RT PCR (hospital order, performed in [facility] hospital lab) Nasopharyngeal Nasopharyngeal Swab     Status: Abnormal   Collection Time: 09/02/20  2:42 PM   Specimen: Nasopharyngeal Swab  Result Value Ref Range Status   SARS Coronavirus 2 POSITIVE (A) NEGATIVE Final    Comment: RESULT CALLED TO, READ BACK BY AND VERIFIED WITH: OAKLEY @ 1708 ON [id number] BY HENDERSON L (NOTE) SARS-CoV-2 target nucleic acids are DETECTED  SARS-CoV-2 RNA is generally detectable in upper respiratory specimens  during the acute phase of infection.  Positive results are indicative  of the presence of the identified virus, but do not rule out bacterial infection or co-infection with other pathogens not detected by the test.  Clinical correlation with patient history and  other diagnostic information is necessary to determine patient infection status.  The expected result is negative.  Fact Sheet for Patients:   BoilerBrush.com.cy   Fact Sheet for Healthcare Providers:   https://pope.com/    This test is not yet approved or cleared by the [place] FDA and  has been authorized for detection and/or diagnosis of SARS-CoV-2 by FDA under an Emergency Use Authorization (EUA).  This EUA will remain in effect (meaning thi s test can be used) for the duration of  the COVID-19 declaration under Section 564(b)(1) of the Act, 21 U.S.C. section 360-bbb-3(b)(1), unless the authorization is terminated or revoked sooner.  Performed at [facility], [address], [place], [place] [address]   Culture, blood (x 2)     Status: None (Preliminary result)   Collection Time:  09/02/20  2:56 PM   Specimen: BLOOD LEFT HAND  Result Value Ref Range Status   Specimen Description BLOOD LEFT HAND  Final   Special Requests  Final    BOTTLES DRAWN AEROBIC AND ANAEROBIC Blood Culture results may not be optimal due to an inadequate volume of blood received in culture bottles   Culture   Final    NO GROWTH 2 DAYS Performed at [facility], [address] [place], [place] [address]    Report Status PENDING  Incomplete     Time coordinating discharge:  SIGNED:   Erick Blinks, MD  [facility] 09/04/2020, 8:58 PM   If 7PM-7AM, please contact night-coverage [email or web address]

## 2020-09-04 NOTE — ED Notes (Signed)
Pt provided with orange juice per request.

## 2020-09-04 NOTE — TOC Transition Note (Signed)
Transition of Care [facility]) - CM/SW Discharge Note   Patient Details  Name: Zachary Mccann MRN: [id number] Date of Birth: 1971-10-10  Transition of Care [facility] CM/SW Contact:  Karn Cassis, LCSW Phone Number: 09/04/2020, 3:41 PM   Clinical Narrative:  Pt d/c today. MD reported pt needed transportation to Remdesivir appointment on Monday. LCSW contacted transportation services and was told to call back tomorrow to arrange. MD aware. Pt's family now planning to provide transportation for pt. Remdesivir appointments have been scheduled per chart. No other needs reported.      Final next level of care: Home/Self Care Barriers to Discharge: Barriers Resolved   Patient Goals and CMS Choice Patient states their goals for this hospitalization and ongoing recovery are:: return home      Discharge Placement                    Patient and family notified of of transfer: 09/04/20  Discharge Plan and Services                                     Social Determinants of Health (SDOH) Interventions     Readmission Risk Interventions No flowsheet data found.

## 2020-09-04 NOTE — ED Notes (Addendum)
Dr. Kerry Hough paged and made aware that patient is requesting to be discharged at this time. Awaiting a page back.   1:53 PM Per Dr. Kerry Hough, on way to round on patient at this time. This RN verbalized understanding and is in agreement.

## 2020-09-05 ENCOUNTER — Ambulatory Visit (HOSPITAL_COMMUNITY)
Admit: 2020-09-05 | Discharge: 2020-09-05 | Disposition: A | Discharge status: Home / Self Care | Payer: Medicaid Other | Source: Ambulatory Visit | Attending: Pulmonary Disease | Admitting: Pulmonary Disease

## 2020-09-05 DIAGNOSIS — U071 COVID-19: Secondary | ICD-10-CM | POA: Diagnosis not present

## 2020-09-05 DIAGNOSIS — J1282 Pneumonia due to coronavirus disease 2019: Secondary | ICD-10-CM | POA: Diagnosis not present

## 2020-09-05 MED ORDER — FAMOTIDINE IN NACL 20-0.9 MG/50ML-% IV SOLN: 20.0000 mg | Freq: Once | INTRAVENOUS | Status: DC | PRN

## 2020-09-05 MED ORDER — EPINEPHRINE 0.3 MG/0.3ML IJ SOAJ: 0.3000 mg | Freq: Once | INTRAMUSCULAR | Status: DC | PRN

## 2020-09-05 MED ORDER — SODIUM CHLORIDE 0.9 % IV SOLN: INTRAVENOUS | Status: DC | PRN

## 2020-09-05 MED ORDER — ALBUTEROL SULFATE HFA 108 (90 BASE) MCG/ACT IN AERS: 2.0000 | INHALATION_SPRAY | Freq: Once | RESPIRATORY_TRACT | Status: DC | PRN

## 2020-09-05 MED ORDER — DIPHENHYDRAMINE HCL 50 MG/ML IJ SOLN: 50.0000 mg | Freq: Once | INTRAMUSCULAR | Status: DC | PRN

## 2020-09-05 MED ORDER — SODIUM CHLORIDE 0.9 % IV SOLN
100.0000 mg | Freq: Once | INTRAVENOUS | Status: AC
  Administered 2020-09-05: 100 mg via INTRAVENOUS
  Filled 2020-09-05: qty 20

## 2020-09-05 MED ORDER — METHYLPREDNISOLONE SODIUM SUCC 125 MG IJ SOLR: 125.0000 mg | Freq: Once | INTRAMUSCULAR | Status: DC | PRN

## 2020-09-05 NOTE — Discharge Instructions (Signed)
10 Things You Can Do to Manage Your COVID-19 Symptoms at Home If you have possible or confirmed COVID-19: 1. Stay home from work and school. And stay away from other public places. If you must go out, avoid using any kind of public transportation, ridesharing, or taxis. 2. Monitor your symptoms carefully. If your symptoms get worse, call your healthcare provider immediately. 3. Get rest and stay hydrated. 4. If you have a medical appointment, call the healthcare provider ahead of time and tell them that you have or may have COVID-19. 5. For medical emergencies, call 911 and notify the dispatch personnel that you have or may have COVID-19. 6. Cover your cough and sneezes with a tissue or use the inside of your elbow. 7. Wash your hands often with soap and water for at least 20 seconds or clean your hands with an alcohol-based hand sanitizer that contains at least 60% alcohol. 8. As much as possible, stay in a specific room and away from other people in your home. Also, you should use a separate bathroom, if available. If you need to be around other people in or outside of the home, wear a mask. 9. Avoid sharing personal items with other people in your household, like dishes, towels, and bedding. 10. Clean all surfaces that are touched often, like counters, tabletops, and doorknobs. Use household cleaning sprays or wipes according to the label instructions. cdc.gov/coronavirus 06/17/2019 This information is not intended to replace advice given to you by your health care provider. Make sure you discuss any questions you have with your health care provider. Document Revised: 11/19/2019 Document Reviewed: 11/19/2019 Elsevier Patient Education  2020 Elsevier Inc.  

## 2020-09-05 NOTE — Progress Notes (Signed)
  Diagnosis: COVID-19  Physician: Dr. Delford Field  Procedure: [facility] Med: remdesivir infusion - Provided patient with remdesivir fact sheet for patients, parents and caregivers prior to infusion.  Complications: No immediate complications noted.  Discharge: Discharged home   Lorana Maffeo Mliss Sax 09/05/2020

## 2020-09-06 ENCOUNTER — Ambulatory Visit (HOSPITAL_COMMUNITY)
Admit: 2020-09-06 | Discharge: 2020-09-06 | Disposition: A | Discharge status: Home / Self Care | Payer: Medicaid Other | Source: Ambulatory Visit | Attending: Pulmonary Disease | Admitting: Pulmonary Disease

## 2020-09-06 DIAGNOSIS — J1282 Pneumonia due to coronavirus disease 2019: Secondary | ICD-10-CM | POA: Diagnosis not present

## 2020-09-06 DIAGNOSIS — U071 COVID-19: Secondary | ICD-10-CM | POA: Diagnosis not present

## 2020-09-06 MED ORDER — SODIUM CHLORIDE 0.9 % IV SOLN
100.0000 mg | Freq: Once | INTRAVENOUS | Status: AC
  Administered 2020-09-06: 100 mg via INTRAVENOUS
  Filled 2020-09-06: qty 20

## 2020-09-06 MED ORDER — FAMOTIDINE IN NACL 20-0.9 MG/50ML-% IV SOLN: 20.0000 mg | Freq: Once | INTRAVENOUS | Status: DC | PRN

## 2020-09-06 MED ORDER — SODIUM CHLORIDE 0.9 % IV SOLN: INTRAVENOUS | Status: DC | PRN

## 2020-09-06 MED ORDER — EPINEPHRINE 0.3 MG/0.3ML IJ SOAJ: 0.3000 mg | Freq: Once | INTRAMUSCULAR | Status: DC | PRN

## 2020-09-06 MED ORDER — METHYLPREDNISOLONE SODIUM SUCC 125 MG IJ SOLR: 125.0000 mg | Freq: Once | INTRAMUSCULAR | Status: DC | PRN

## 2020-09-06 MED ORDER — ALBUTEROL SULFATE HFA 108 (90 BASE) MCG/ACT IN AERS: 2.0000 | INHALATION_SPRAY | Freq: Once | RESPIRATORY_TRACT | Status: DC | PRN

## 2020-09-06 MED ORDER — DIPHENHYDRAMINE HCL 50 MG/ML IJ SOLN: 50.0000 mg | Freq: Once | INTRAMUSCULAR | Status: DC | PRN

## 2020-09-06 NOTE — Progress Notes (Signed)
  Diagnosis: COVID-19  Physician: Dr. Shan Levans  Procedure: [facility] Infusion Clinic Med: remdesivir infusion - Provided patient with remdesivir fact sheet for patients, parents and caregivers prior to infusion.  Complications: No immediate complications noted.  Discharge: Discharged home   Cherlynn Perches 09/06/2020

## 2020-09-06 NOTE — Discharge Instructions (Signed)
10 Things You Can Do to Manage Your COVID-19 Symptoms at Home If you have possible or confirmed COVID-19: 1. Stay home from work and school. And stay away from other public places. If you must go out, avoid using any kind of public transportation, ridesharing, or taxis. 2. Monitor your symptoms carefully. If your symptoms get worse, call your healthcare provider immediately. 3. Get rest and stay hydrated. 4. If you have a medical appointment, call the healthcare provider ahead of time and tell them that you have or may have COVID-19. 5. For medical emergencies, call 911 and notify the dispatch personnel that you have or may have COVID-19. 6. Cover your cough and sneezes with a tissue or use the inside of your elbow. 7. Wash your hands often with soap and water for at least 20 seconds or clean your hands with an alcohol-based hand sanitizer that contains at least 60% alcohol. 8. As much as possible, stay in a specific room and away from other people in your home. Also, you should use a separate bathroom, if available. If you need to be around other people in or outside of the home, wear a mask. 9. Avoid sharing personal items with other people in your household, like dishes, towels, and bedding. 10. Clean all surfaces that are touched often, like counters, tabletops, and doorknobs. Use household cleaning sprays or wipes according to the label instructions. cdc.gov/coronavirus 06/17/2019 This information is not intended to replace advice given to you by your health care provider. Make sure you discuss any questions you have with your health care provider. Document Revised: 11/19/2019 Document Reviewed: 11/19/2019 Elsevier Patient Education  2020 Elsevier Inc.  

## 2020-09-07 LAB — CULTURE, BLOOD (ROUTINE X 2)
Culture: NO GROWTH
Culture: NO GROWTH
Special Requests: ADEQUATE

## 2020-09-16 DIAGNOSIS — Z419 Encounter for procedure for purposes other than remedying health state, unspecified: Secondary | ICD-10-CM | POA: Diagnosis not present

## 2020-09-28 ENCOUNTER — Other Ambulatory Visit (INDEPENDENT_AMBULATORY_CARE_PROVIDER_SITE_OTHER): Payer: Self-pay | Admitting: Internal Medicine

## 2020-09-28 DIAGNOSIS — I10 Essential (primary) hypertension: Secondary | ICD-10-CM

## 2020-09-28 DIAGNOSIS — K5900 Constipation, unspecified: Secondary | ICD-10-CM

## 2020-09-28 DIAGNOSIS — G479 Sleep disorder, unspecified: Secondary | ICD-10-CM

## 2020-09-28 DIAGNOSIS — I63512 Cerebral infarction due to unspecified occlusion or stenosis of left middle cerebral artery: Secondary | ICD-10-CM

## 2020-09-29 NOTE — Telephone Encounter (Signed)
Please call this patient and see if you can get him scheduled for a follow-up appointment within the next month.  It has been over a year since we have seen him in the office, so I will not refill medications any further.  I have sent a 30-day supply to his pharmacy for this medication so that he does not run out before he is seen in the office.

## 2020-10-12 ENCOUNTER — Other Ambulatory Visit (INDEPENDENT_AMBULATORY_CARE_PROVIDER_SITE_OTHER): Payer: Self-pay | Admitting: Internal Medicine

## 2020-10-12 DIAGNOSIS — K5900 Constipation, unspecified: Secondary | ICD-10-CM

## 2020-10-12 DIAGNOSIS — I63512 Cerebral infarction due to unspecified occlusion or stenosis of left middle cerebral artery: Secondary | ICD-10-CM

## 2020-10-12 DIAGNOSIS — G479 Sleep disorder, unspecified: Secondary | ICD-10-CM

## 2020-10-12 DIAGNOSIS — I10 Essential (primary) hypertension: Secondary | ICD-10-CM

## 2020-10-17 ENCOUNTER — Other Ambulatory Visit: Payer: Self-pay

## 2020-10-17 ENCOUNTER — Encounter (INDEPENDENT_AMBULATORY_CARE_PROVIDER_SITE_OTHER): Payer: Self-pay | Admitting: Nurse Practitioner

## 2020-10-17 ENCOUNTER — Ambulatory Visit (INDEPENDENT_AMBULATORY_CARE_PROVIDER_SITE_OTHER): Payer: Medicaid Other | Admitting: Nurse Practitioner

## 2020-10-17 VITALS — BP 130/82 | HR 97 | Temp 98.1°F

## 2020-10-17 DIAGNOSIS — Z131 Encounter for screening for diabetes mellitus: Secondary | ICD-10-CM

## 2020-10-17 DIAGNOSIS — I10 Essential (primary) hypertension: Secondary | ICD-10-CM

## 2020-10-17 DIAGNOSIS — E559 Vitamin D deficiency, unspecified: Secondary | ICD-10-CM

## 2020-10-17 DIAGNOSIS — N1831 Chronic kidney disease, stage 3a: Secondary | ICD-10-CM

## 2020-10-17 DIAGNOSIS — R5383 Other fatigue: Secondary | ICD-10-CM | POA: Diagnosis not present

## 2020-10-17 DIAGNOSIS — Z1329 Encounter for screening for other suspected endocrine disorder: Secondary | ICD-10-CM

## 2020-10-17 DIAGNOSIS — Z419 Encounter for procedure for purposes other than remedying health state, unspecified: Secondary | ICD-10-CM | POA: Diagnosis not present

## 2020-10-17 DIAGNOSIS — Z8673 Personal history of transient ischemic attack (TIA), and cerebral infarction without residual deficits: Secondary | ICD-10-CM

## 2020-10-17 DIAGNOSIS — Z23 Encounter for immunization: Secondary | ICD-10-CM

## 2020-10-17 DIAGNOSIS — N529 Male erectile dysfunction, unspecified: Secondary | ICD-10-CM | POA: Diagnosis not present

## 2020-10-17 MED ORDER — SILDENAFIL CITRATE 100 MG PO TABS: 50.0000 mg | ORAL_TABLET | Freq: Every day | ORAL | 3 refills | Status: AC | PRN

## 2020-10-17 NOTE — Progress Notes (Signed)
Subjective:  Patient ID: Zachary Mccann, male    DOB: [birth year]  Age: 49 y.o. MRN: [id number]  CC:  Chief Complaint  Patient presents with  . Follow-up    Doing okay  . Hypertension  . Hyperlipidemia  . Other    History of vitamin D Deficiency, erectile dysfunction      HPI  This patient arrives today for the above.  Hypertension/hyperlipidemia: He has a history of hypertension hyperlipidemia as well as CVA.  He continues on hydralazine 3 times daily, Coreg, atorvastatin, and clopidogrel.  He is tolerating his medications well.  Last lipid panel was collected approximately 1 year ago and showed LDL of 60.  He is due to have this collected again today.  History of vitamin D deficiency: He has a history of severe vitamin D deficiency, per current medical record system review his serum vitamin D level is been as low as 5.5.  His been well over a year since this has been collected in our system so he is due for this to be rechecked today.  He is not currently on any vitamin D supplementation.  Erectile dysfunction: He also mentions to me that he does have concerns with erectile dysfunction.  He is not on any nitrates at this time.  He tells me he has taken Viagra in the past and would like a refill on this if possible.  Past Medical History:  Diagnosis Date  . CAD (coronary artery disease)    a. s/p NSTEMI in 04/2018 with DES to LCx.   [name] Hypertension   . Myocardial infarction ([name])   . Pneumonia   . Stroke [facility])       Family History  Problem Relation Age of Onset  . Hypertension Mother   . Stroke Mother   . Dementia Mother   . Hypertension Father   . Stroke Father   . Cancer Father   . Alcoholism Father   . 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 Lives with girlfriend.   Social History   Tobacco Use  . Smoking status: Former Smoker     Types: Cigarettes  . Smokeless tobacco: Never Used  . Tobacco comment: smoked for 10-20 years at 1/2 ppd as of 2019  Substance Use Topics  . Alcohol use: Not Currently     Current Meds  Medication Sig  . acetaminophen (TYLENOL) 325 MG tablet Take 2 tablets (650 mg total) by mouth every 6 (six) hours as needed for mild pain (or Fever >/= 101).  [name] albuterol (VENTOLIN HFA) 108 (90 Base) MCG/ACT inhaler Inhale 2 puffs into the lungs every 6 (six) hours as needed for wheezing or shortness of breath.  [name] atorvastatin (LIPITOR) 40 MG tablet TAKE 1 TABLET BY MOUTH ONCE DAILY AT  6  PM (Patient taking differently: Take 40 mg by mouth daily. )  . carvedilol (COREG) 6.25 MG tablet TAKE 1 TABLET BY MOUTH TWICE DAILY WITH MEALS  . clopidogrel (PLAVIX) 75 MG tablet Take 1 tablet (75 mg total) by mouth daily.  [name] guaiFENesin (MUCINEX) 600 MG 12 hr tablet Take 1 tablet (600 mg total) by mouth 2 (two) times daily.  . hydrALAZINE (APRESOLINE) 25 MG tablet Take 1 tablet (25 mg total) by mouth 3 (three) times daily.  . Melatonin 10 MG TABS Take 10 mg by mouth at bedtime.  . predniSONE (  DELTASONE) 50 MG tablet Take 1 tablet (50 mg total) by mouth daily with breakfast.  . senna-docusate (SENOKOT-S) 8.6-50 MG tablet Take 2 tablets by mouth at bedtime.    ROS:  Review of Systems  Constitutional: Positive for malaise/fatigue. Negative for chills.  Eyes: Negative.   Respiratory: Negative.   Cardiovascular: Negative.   Neurological: Negative.      Objective:   Today's Vitals: BP 130/82   Pulse 97   Temp 98.1 F (36.7 C) (Temporal)   SpO2 95%  Vitals with BMI 10/17/2020 09/06/2020 09/06/2020  Height (No Data) - -  Weight (No Data) - -  BMI - - -  Systolic 707 867 544  Diastolic 82 98 84  Pulse 97 94 100     Physical Exam Vitals reviewed.  Constitutional:      Appearance: Normal appearance.  HENT:     Head: Normocephalic and atraumatic.  Cardiovascular:     Rate and Rhythm: Normal rate and  regular rhythm.  Pulmonary:     Effort: Pulmonary effort is normal.     Breath sounds: Normal breath sounds.  Musculoskeletal:     Cervical back: Neck supple.  Skin:    General: Skin is warm and dry.  Neurological:     Mental Status: He is alert and oriented to person, place, and time.     Motor: Weakness present.     Gait: Gait abnormal (wheelchair bound).  Psychiatric:        Mood and Affect: Mood normal.        Behavior: Behavior normal.        Thought Content: Thought content normal.        Judgment: Judgment normal.         Assessment and Plan   1. Essential hypertension   2. Stage 3a chronic kidney disease ([place])   3. History of stroke   4. Severe Vitamin D deficiency   5. Screening for diabetes mellitus   6. Screening for thyroid disorder   7. Fatigue, unspecified type   8. Needs flu shot   9. Erectile dysfunction, unspecified erectile dysfunction type      Plan: 1.-7.  We will collect blood work for further evaluation today.  He will continue on his current chronic medications as prescribed. 8. Will administer flu shot today. 9.  I will prescribe Viagra that he can take as needed daily for treatment of erectile dysfunction.  He was told if he starts to experience chest pain and ends up in the emergency department that he needs to notify providers of this medication if he is taking it within 24 hours of going to the ER.  He was also reminded not to take this medication in addition to any as needed medication such as nitroglycerin for treatment of chest pain.  He tells me he understands.   Tests ordered Orders Placed This Encounter  Procedures  . Flu Vaccine QUAD 6+ mos PF IM (Fluarix Quad PF)  . CMP with eGFR([facility])  . CBC with Differential/Platelets  . Lipid Panel  . Hemoglobin A1c  . TSH  . Vitamin D, 25-hydroxy  . T3, Free  . T4, Free      Meds ordered this encounter  Medications  . sildenafil (VIAGRA) 100 MG tablet    Sig: Take 0.5-1 tablets  (50-100 mg total) by mouth daily as needed for erectile dysfunction.    Dispense:  5 tablet    Refill:  3    Order Specific Question:  Supervising Provider    Answer:   Doree Albee [[id number]]    Patient to follow-up in 3 months or sooner as neded  Ailene Ards, NP

## 2020-10-18 ENCOUNTER — Other Ambulatory Visit (INDEPENDENT_AMBULATORY_CARE_PROVIDER_SITE_OTHER): Payer: Self-pay | Admitting: Nurse Practitioner

## 2020-10-18 DIAGNOSIS — N182 Chronic kidney disease, stage 2 (mild): Secondary | ICD-10-CM

## 2020-10-18 LAB — CBC WITH DIFFERENTIAL/PLATELET
Absolute Monocytes: 392 cells/uL (ref 200–950)
Basophils Absolute: 37 cells/uL (ref 0–200)
Basophils Relative: 0.5 %
Eosinophils Absolute: 222 cells/uL (ref 15–500)
Eosinophils Relative: 3 %
HCT: 37.6 % — ABNORMAL LOW (ref 38.5–50.0)
Hemoglobin: 12.8 g/dL — ABNORMAL LOW (ref 13.2–17.1)
Lymphs Abs: 1628 cells/uL (ref 850–3900)
MCH: 30.5 pg (ref 27.0–33.0)
MCHC: 34 g/dL (ref 32.0–36.0)
MCV: 89.5 fL (ref 80.0–100.0)
MPV: 11.9 fL (ref 7.5–12.5)
Monocytes Relative: 5.3 %
Neutro Abs: 5121 cells/uL (ref 1500–7800)
Neutrophils Relative %: 69.2 %
Platelets: 306 10*3/uL (ref 140–400)
RBC: 4.2 10*6/uL (ref 4.20–5.80)
RDW: 12.8 % (ref 11.0–15.0)
Total Lymphocyte: 22 %
WBC: 7.4 10*3/uL (ref 3.8–10.8)

## 2020-10-18 LAB — COMPLETE METABOLIC PANEL WITH GFR
AG Ratio: 1 (calc) (ref 1.0–2.5)
ALT: 10 U/L (ref 9–46)
AST: 10 U/L (ref 10–40)
Albumin: 4 g/dL (ref 3.6–5.1)
Alkaline phosphatase (APISO): 97 U/L (ref 36–130)
BUN/Creatinine Ratio: 13 (calc) (ref 6–22)
BUN: 19 mg/dL (ref 7–25)
CO2: 26 mmol/L (ref 20–32)
Calcium: 9.8 mg/dL (ref 8.6–10.3)
Chloride: 103 mmol/L (ref 98–110)
Creat: 1.41 mg/dL — ABNORMAL HIGH (ref 0.60–1.35)
GFR, Est African American: 67 mL/min/{1.73_m2} (ref >=60)
GFR, Est Non African American: 58 mL/min/{1.73_m2} — ABNORMAL LOW (ref >=60)
Globulin: 4.1 g/dL (calc) — ABNORMAL HIGH (ref 1.9–3.7)
Glucose, Bld: 113 mg/dL — ABNORMAL HIGH (ref 65–99)
Potassium: 4.2 mmol/L (ref 3.5–5.3)
Sodium: 139 mmol/L (ref 135–146)
Total Bilirubin: 0.6 mg/dL (ref 0.2–1.2)
Total Protein: 8.1 g/dL (ref 6.1–8.1)

## 2020-10-18 LAB — LIPID PANEL
Cholesterol: 140 mg/dL (ref <200)
HDL: 38 mg/dL — ABNORMAL LOW (ref >=40)
LDL Cholesterol (Calc): 84 mg/dL (calc)
Non-HDL Cholesterol (Calc): 102 mg/dL (calc) (ref <130)
Total CHOL/HDL Ratio: 3.7 (calc) (ref <5.0)
Triglycerides: 90 mg/dL (ref <150)

## 2020-10-18 LAB — VITAMIN D 25 HYDROXY (VIT D DEFICIENCY, FRACTURES): Vit D, 25-Hydroxy: 18 ng/mL — ABNORMAL LOW (ref 30–100)

## 2020-10-18 LAB — TSH: TSH: 3.24 mIU/L (ref 0.40–4.50)

## 2020-10-18 LAB — HEMOGLOBIN A1C
Hgb A1c MFr Bld: 6.2 % of total Hgb — ABNORMAL HIGH (ref <5.7)
Mean Plasma Glucose: 131 (calc)
eAG (mmol/L): 7.3 (calc)

## 2020-10-18 LAB — T3, FREE: T3, Free: 2.8 pg/mL (ref 2.3–4.2)

## 2020-10-18 LAB — T4, FREE: Free T4: 1.1 ng/dL (ref 0.8–1.8)

## 2020-10-18 MED ORDER — VITAMIN D-3 125 MCG (5000 UT) PO TABS: 1.0000 | ORAL_TABLET | Freq: Every day | ORAL | 3 refills | Status: DC

## 2020-10-18 NOTE — Progress Notes (Signed)
Order for referral to nephrology for chronic kidney disease

## 2020-10-27 ENCOUNTER — Ambulatory Visit (INDEPENDENT_AMBULATORY_CARE_PROVIDER_SITE_OTHER): Payer: Medicaid Other | Admitting: Nurse Practitioner

## 2020-10-27 ENCOUNTER — Encounter (INDEPENDENT_AMBULATORY_CARE_PROVIDER_SITE_OTHER): Payer: Self-pay | Admitting: Nurse Practitioner

## 2020-10-27 ENCOUNTER — Other Ambulatory Visit: Payer: Self-pay

## 2020-10-27 VITALS — BP 124/76 | HR 91 | Temp 97.3°F

## 2020-10-27 DIAGNOSIS — R8290 Unspecified abnormal findings in urine: Secondary | ICD-10-CM | POA: Diagnosis not present

## 2020-10-27 DIAGNOSIS — R21 Rash and other nonspecific skin eruption: Secondary | ICD-10-CM

## 2020-10-27 MED ORDER — NYSTATIN 100000 UNIT/GM EX POWD: 1.0000 "application " | Freq: Three times a day (TID) | CUTANEOUS | 2 refills | Status: AC

## 2020-10-27 NOTE — Progress Notes (Signed)
Subjective:  Patient ID: Zachary Mccann, male    DOB: October 24, 1971  Age: 49 y.o. MRN: [id number]  CC:  Chief Complaint  Patient presents with  . Rash    right side groin and underarm area  . Other    Cloudy urine      HPI  This patient arrives today for an acute visit for the above.  He has a rash located under the skin fold of his right chest, and in the groin on the right side. He tells me its not itching however his fiance tells me that the skin is quite irritated and she will sometimes notice blood.  She also tells me that he has been experiencing some cloudy urine for the last week. He denies any dysuria, flank pain, nausea, vomiting, fever, hematuria.   Past Medical History:  Diagnosis Date  . CAD (coronary artery disease)    a. s/p NSTEMI in 04/2018 with DES to LCx.   [name] Hypertension   . Myocardial infarction ([facility])   . Pneumonia   . Stroke [facility])       Family History  Problem Relation Age of Onset  . Hypertension Mother   . Stroke Mother   . Dementia Mother   . Hypertension Father   . Stroke Father   . Cancer Father   . Alcoholism Father   . 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 Lives with girlfriend.   Social History   Tobacco Use  . Smoking status: Former Smoker    Types: Cigarettes  . Smokeless tobacco: Never Used  . Tobacco comment: smoked for 10-20 years at 1/2 ppd as of 2019  Substance Use Topics  . Alcohol use: Not Currently     Current Meds  Medication Sig  . acetaminophen (TYLENOL) 325 MG tablet Take 2 tablets (650 mg total) by mouth every 6 (six) hours as needed for mild pain (or Fever >/= 101).  [name] albuterol (VENTOLIN HFA) 108 (90 Base) MCG/ACT inhaler Inhale 2 puffs into the lungs every 6 (six) hours as needed for wheezing or shortness of breath.  [name] atorvastatin (LIPITOR) 40 MG tablet TAKE 1 TABLET BY MOUTH  ONCE DAILY AT  6  PM (Patient taking differently: Take 40 mg by mouth daily. )  . carvedilol (COREG) 6.25 MG tablet TAKE 1 TABLET BY MOUTH TWICE DAILY WITH MEALS  . Cholecalciferol (VITAMIN D-3) 125 MCG (5000 UT) TABS Take 1 tablet by mouth daily.  . clopidogrel (PLAVIX) 75 MG tablet Take 1 tablet (75 mg total) by mouth daily.  [name] guaiFENesin (MUCINEX) 600 MG 12 hr tablet Take 1 tablet (600 mg total) by mouth 2 (two) times daily.  . hydrALAZINE (APRESOLINE) 25 MG tablet Take 1 tablet (25 mg total) by mouth 3 (three) times daily.  . Melatonin 10 MG TABS Take 10 mg by mouth at bedtime.  . senna-docusate (SENOKOT-S) 8.6-50 MG tablet Take 2 tablets by mouth at bedtime.  . sildenafil (VIAGRA) 100 MG tablet Take 0.5-1 tablets (50-100 mg total) by mouth daily as needed for erectile dysfunction.    ROS:  See HPI  Objective:   Today's Vitals: BP 124/76   Pulse 91   Temp (!) 97.3 F (36.3 C) (Temporal)   SpO2 95%  Vitals with BMI 10/27/2020 10/17/2020 09/06/2020  Height (No Data) (No Data) -  Weight (No Data) (No Data) -  BMI - - -  Systolic 124 130 342  Diastolic 76 82 98  Pulse 91 97 94     Physical Exam Vitals reviewed.  Constitutional:      Appearance: Normal appearance.  HENT:     Head: Normocephalic and atraumatic.  Musculoskeletal:     Cervical back: Neck supple.  Skin:    General: Skin is warm and dry.       Neurological:     Mental Status: He is alert and oriented to person, place, and time.  Psychiatric:        Mood and Affect: Mood normal.        Behavior: Behavior normal.        Thought Content: Thought content normal.        Judgment: Judgment normal.          Assessment and Plan   1. Cloudy urine   2. Rash      Plan: 1.  Patient is asymptomatic for urinary tract infection except for having some cloudy urine.  Will send urine off for microscopic evaluation and will send for culture.  Further recommendations will be made based upon those results. 2.   Rash appears consistent with fungal infection will trial course of nystatin powder.  I encouraged family member to place cotton fabric such as a pillowcase under patient's skin folds to help wick away moisture going forward.  I encouraged him to let me know if symptoms persist or do not improve over the next 2 weeks and if so will consider adjustment to treatment plan.   Tests ordered Orders Placed This Encounter  Procedures  . Culture, Urine  . Urine Microscopic Only      Meds ordered this encounter  Medications  . nystatin (MYCOSTATIN/NYSTOP) powder    Sig: Apply 1 application topically 3 (three) times daily.    Dispense:  15 g    Refill:  2    Order Specific Question:   Supervising Provider    Answer:   Wilson Singer [[id number]]    Patient to follow-up as scheduled in 2 months, or sooner as needed.  [name], NP

## 2020-10-27 NOTE — Patient Instructions (Signed)
Will await urine results before determining next steps. I will call you once these results come back. I have sent powder to pharmacy that you can place on rash three times a day. If rash is no better or worse in 2 weeks call the office and we will try a different medication.

## 2020-10-28 ENCOUNTER — Telehealth (INDEPENDENT_AMBULATORY_CARE_PROVIDER_SITE_OTHER): Payer: Self-pay | Admitting: Nurse Practitioner

## 2020-10-28 ENCOUNTER — Other Ambulatory Visit (INDEPENDENT_AMBULATORY_CARE_PROVIDER_SITE_OTHER): Payer: Self-pay | Admitting: Nurse Practitioner

## 2020-10-28 DIAGNOSIS — N390 Urinary tract infection, site not specified: Secondary | ICD-10-CM

## 2020-10-28 MED ORDER — CIPROFLOXACIN HCL 500 MG PO TABS: 500.0000 mg | ORAL_TABLET | Freq: Two times a day (BID) | ORAL | 0 refills | Status: AC

## 2020-10-28 NOTE — Telephone Encounter (Signed)
I was able to contact this patient's fiance and let her know that the patient's urine did show signs of UTI. I recommended that he start on an antibiotic. Prescribed ciprofloxacin 500mg  BID for 7 days. I told her urine culture was still pending and that I would notify her of the results once they have resulted. We did discuss black box warning of tendonitis and that if he were to seem unwell on the medication to notify the office on Monday or sometime next week. She tells me she understands.

## 2020-10-28 NOTE — Progress Notes (Signed)
Rx for UTI

## 2020-10-29 LAB — URINALYSIS, MICROSCOPIC ONLY
Hyaline Cast: NONE SEEN /LPF
Squamous Epithelial / HPF: NONE SEEN /HPF (ref <=5)

## 2020-10-29 LAB — URINE CULTURE

## 2020-10-31 ENCOUNTER — Other Ambulatory Visit (INDEPENDENT_AMBULATORY_CARE_PROVIDER_SITE_OTHER): Payer: Self-pay | Admitting: Nurse Practitioner

## 2020-10-31 DIAGNOSIS — N390 Urinary tract infection, site not specified: Secondary | ICD-10-CM

## 2020-10-31 NOTE — Progress Notes (Signed)
Orders for repeat u/a with reflex to culture.

## 2020-11-09 ENCOUNTER — Other Ambulatory Visit (INDEPENDENT_AMBULATORY_CARE_PROVIDER_SITE_OTHER): Payer: Self-pay | Admitting: Nurse Practitioner

## 2020-11-09 DIAGNOSIS — G479 Sleep disorder, unspecified: Secondary | ICD-10-CM

## 2020-11-09 DIAGNOSIS — I10 Essential (primary) hypertension: Secondary | ICD-10-CM

## 2020-11-09 DIAGNOSIS — I63512 Cerebral infarction due to unspecified occlusion or stenosis of left middle cerebral artery: Secondary | ICD-10-CM

## 2020-11-09 DIAGNOSIS — K5900 Constipation, unspecified: Secondary | ICD-10-CM

## 2020-11-16 DIAGNOSIS — Z419 Encounter for procedure for purposes other than remedying health state, unspecified: Secondary | ICD-10-CM | POA: Diagnosis not present

## 2020-11-21 DIAGNOSIS — N390 Urinary tract infection, site not specified: Secondary | ICD-10-CM | POA: Diagnosis not present

## 2020-11-22 LAB — URINALYSIS W MICROSCOPIC + REFLEX CULTURE
Bacteria, UA: NONE SEEN /HPF
Bilirubin Urine: NEGATIVE
Glucose, UA: NEGATIVE
Hgb urine dipstick: NEGATIVE
Hyaline Cast: NONE SEEN /LPF
Ketones, ur: NEGATIVE
Leukocyte Esterase: NEGATIVE
Nitrites, Initial: NEGATIVE
Protein, ur: NEGATIVE
RBC / HPF: NONE SEEN /HPF (ref 0–2)
Specific Gravity, Urine: 1.013 (ref 1.001–1.03)
pH: <=5 (ref 5.0–8.0)

## 2020-11-22 LAB — NO CULTURE INDICATED

## 2020-11-24 ENCOUNTER — Other Ambulatory Visit (INDEPENDENT_AMBULATORY_CARE_PROVIDER_SITE_OTHER): Payer: Self-pay | Admitting: Internal Medicine

## 2020-11-24 ENCOUNTER — Other Ambulatory Visit: Payer: Self-pay | Admitting: Nephrology

## 2020-11-24 ENCOUNTER — Other Ambulatory Visit (HOSPITAL_COMMUNITY): Payer: Self-pay | Admitting: Nephrology

## 2020-11-24 DIAGNOSIS — I10 Essential (primary) hypertension: Secondary | ICD-10-CM

## 2020-11-24 DIAGNOSIS — G479 Sleep disorder, unspecified: Secondary | ICD-10-CM

## 2020-11-24 DIAGNOSIS — K5900 Constipation, unspecified: Secondary | ICD-10-CM

## 2020-11-24 DIAGNOSIS — N1831 Chronic kidney disease, stage 3a: Secondary | ICD-10-CM

## 2020-11-24 DIAGNOSIS — I63512 Cerebral infarction due to unspecified occlusion or stenosis of left middle cerebral artery: Secondary | ICD-10-CM

## 2020-11-24 DIAGNOSIS — D649 Anemia, unspecified: Secondary | ICD-10-CM

## 2020-11-24 DIAGNOSIS — E559 Vitamin D deficiency, unspecified: Secondary | ICD-10-CM

## 2020-12-01 ENCOUNTER — Ambulatory Visit (HOSPITAL_COMMUNITY)
Admission: RE | Admit: 2020-12-01 | Discharge: 2020-12-01 | Disposition: A | Discharge status: Home / Self Care | Payer: Medicaid Other | Source: Ambulatory Visit | Attending: Nephrology | Admitting: Nephrology

## 2020-12-01 ENCOUNTER — Other Ambulatory Visit: Payer: Self-pay

## 2020-12-01 DIAGNOSIS — N1831 Chronic kidney disease, stage 3a: Secondary | ICD-10-CM | POA: Insufficient documentation

## 2020-12-01 DIAGNOSIS — I10 Essential (primary) hypertension: Secondary | ICD-10-CM | POA: Insufficient documentation

## 2020-12-01 DIAGNOSIS — E559 Vitamin D deficiency, unspecified: Secondary | ICD-10-CM | POA: Diagnosis not present

## 2020-12-01 DIAGNOSIS — D649 Anemia, unspecified: Secondary | ICD-10-CM

## 2020-12-01 DIAGNOSIS — N189 Chronic kidney disease, unspecified: Secondary | ICD-10-CM | POA: Diagnosis not present

## 2020-12-10 IMAGING — MR MRI HEAD WITHOUT CONTRAST
9 of 11 series · 29 of 48 positions shown · non-contrast
Comparison: CT head without contrast 07/10/2019. CTA of the head
and neck I[REDACTED]. MRI brain and MRA head 09/07/2018.

CLINICAL DATA: Altered mental status. Left facial droop.
Right-sided weakness. Symptoms for 2 days.

EXAM:
MRI HEAD WITHOUT CONTRAST
MRA HEAD WITHOUT CONTRAST
TECHNIQUE: Multiplanar, multiecho pulse sequences of the brain and surrounding
structures were obtained without intravenous contrast. Angiographic
images of the head were obtained using MRA technique without
contrast.

[Series 3: DWI · axial · 3.0mm · 0.94mm/px · z∈[-137,+22]mm · 6 of 108 slices shown (1 of 2)]
[im 1/108]
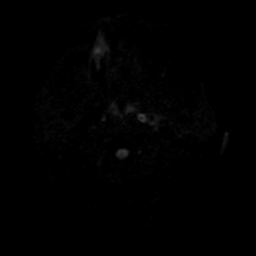
[im 22/108]
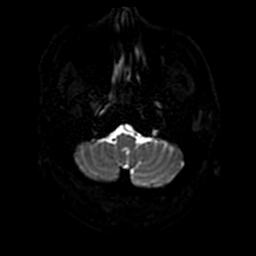
[im 43/108]
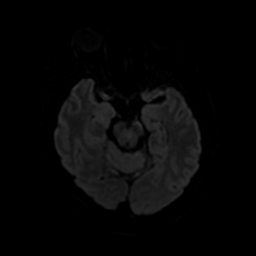
[im 65/108]
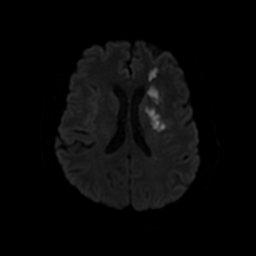
[im 86/108]
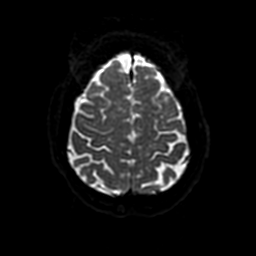
[im 108/108]
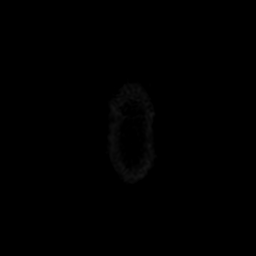

[Series 4: DWI · coronal · 4.0mm · 0.94mm/px · 5 of 74 slices shown (2 of 2)]
[im 1/74]
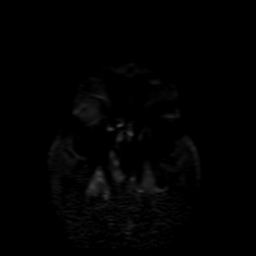
[im 19/74]
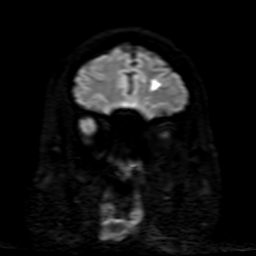
[im 37/74]
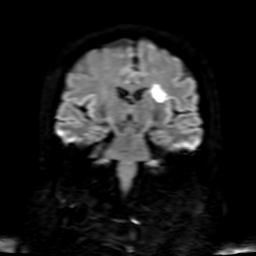
[im 55/74]
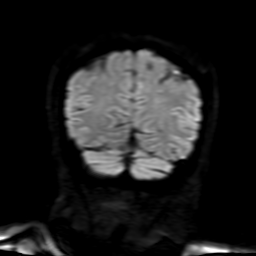
[im 74/74]
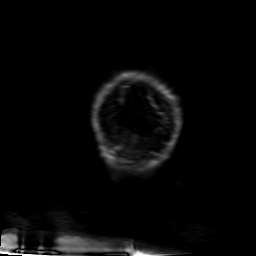

[Series 5: FLAIR · sagittal · 5.0mm · 0.51mm/px · 2 of 22 slices shown (1 of 2)]
[im 1/22]
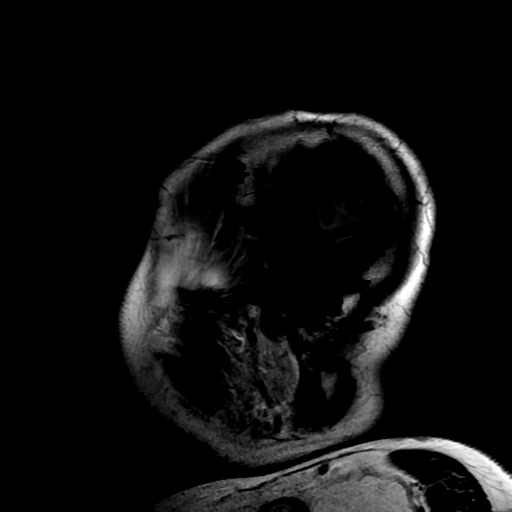
[im 22/22]
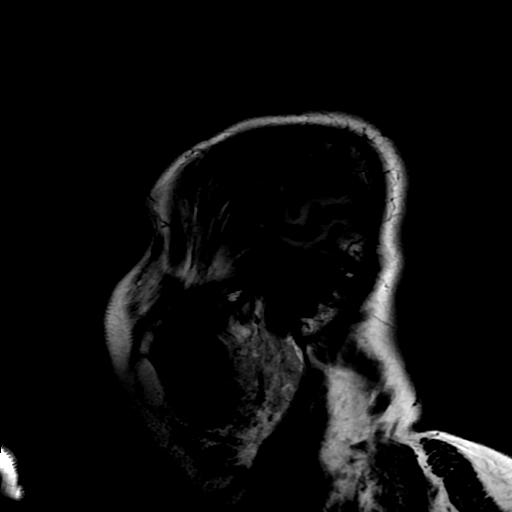

[Series 6: SWI · axial · 3.0mm · 0.47mm/px · z∈[-133,-86]mm · 3 of 96 slices shown]
[im 1/96]
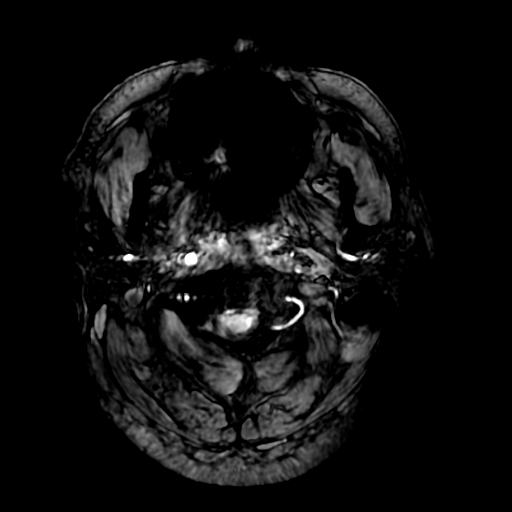
[im 16/96]
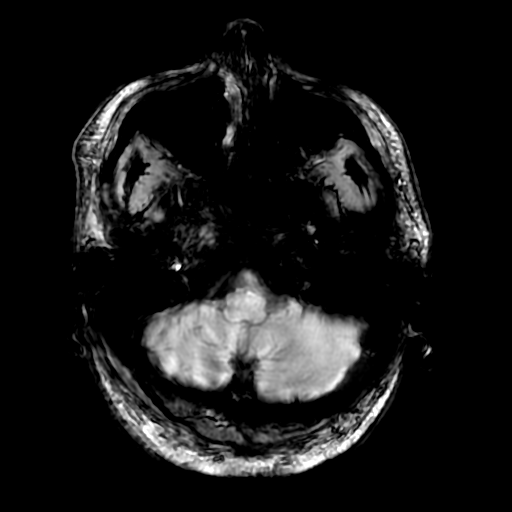
[im 32/96]
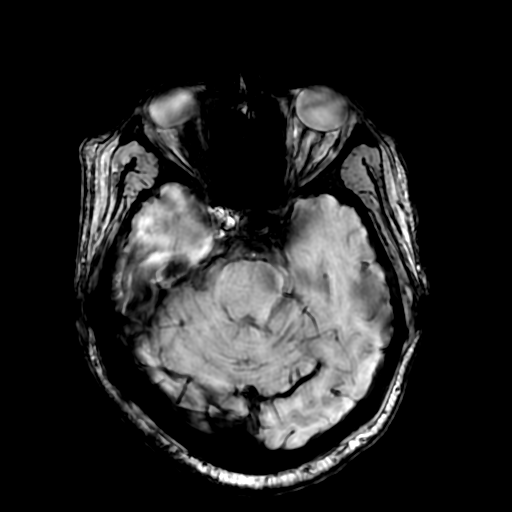

[Series 7: T2 · axial · 5.0mm · 0.47mm/px · z∈[-134,+10]mm · 2 of 25 slices shown (1 of 2)]
[im 1/25]
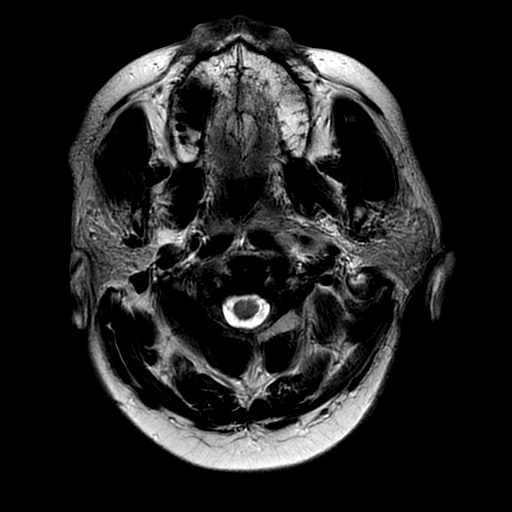
[im 25/25]
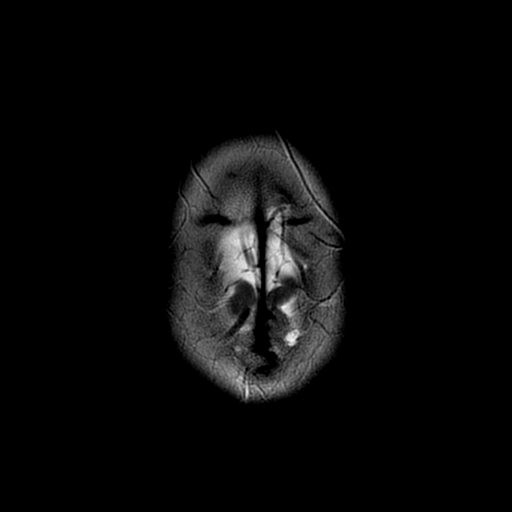

[Series 8: FLAIR · axial · 3.0mm · 0.47mm/px · z∈[-134,+10]mm · 2 of 25 slices shown (2 of 2)]
[im 1/25]
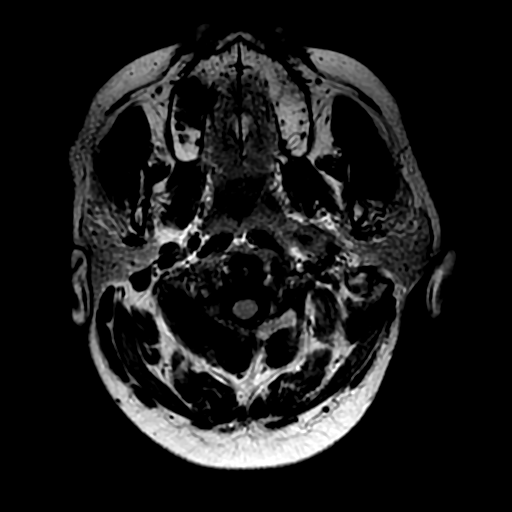
[im 25/25]
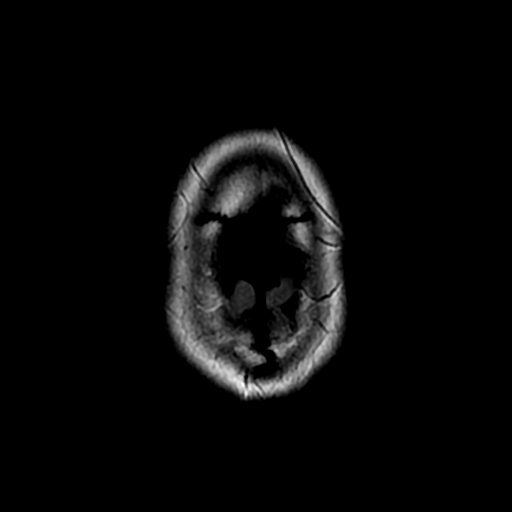

[Series 10: T2 · coronal · 5.0mm · 0.39mm/px · 2 of 27 slices shown (2 of 2)]
[im 1/27]
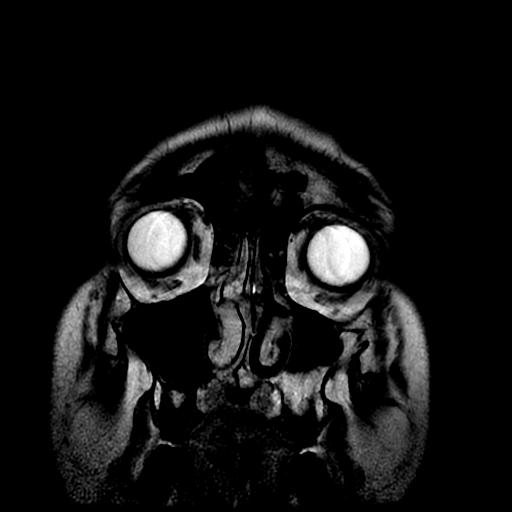
[im 27/27]
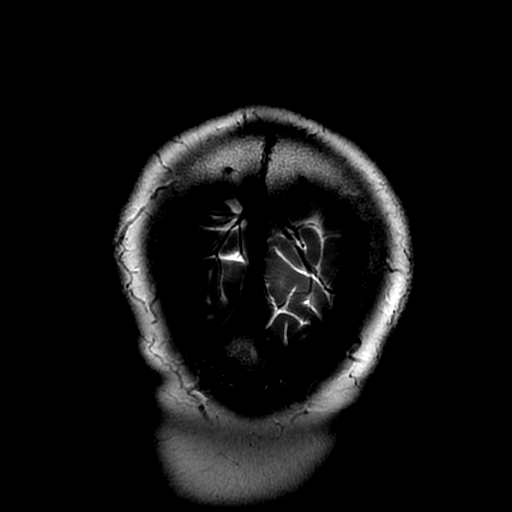

[Series 350: ADC · axial · 3.0mm · 0.94mm/px · z∈[-137,+22]mm · 4 of 54 slices shown (1 of 2)]
[im 1/54]
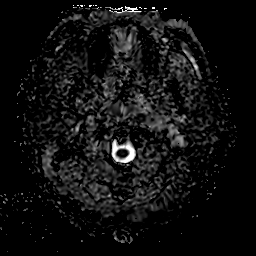
[im 18/54]
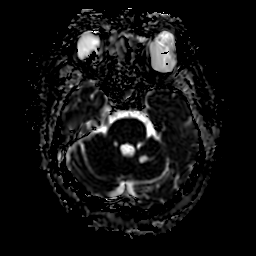
[im 36/54]
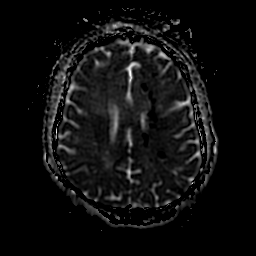
[im 54/54]
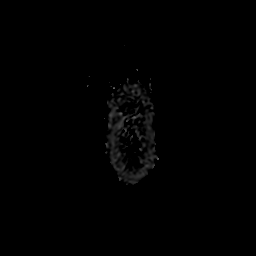

[Series 450: ADC · coronal · 4.0mm · 0.94mm/px · 3 of 37 slices shown (2 of 2)]
[im 1/37]
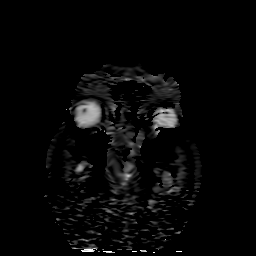
[im 19/37]
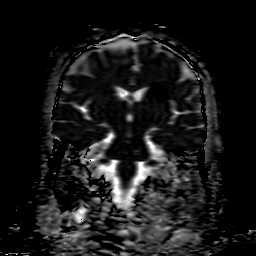
[im 37/37]
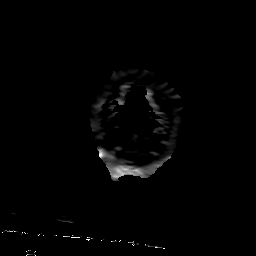

[29 of 48 positions shown; findings below may reference images not displayed]

FINDINGS: MRI HEAD FINDINGS

Brain: The diffusion-weighted images demonstrate scattered areas of
acute nonhemorrhagic infarct within the right frontal and parietal
white matter. There is involvement of the left centrum semi ovale
and corona radiata extending anteriorly into border zone regions.
Additional punctate cortical areas are present in the left parietal
lobe and along the cingulate gyrus. There is no significant
involvement of the right hemisphere.

There is T2 signal changes associated with the areas of
acute/subacute infarction. Remote lacunar infarct of the right
centrum semi ovale is noted. Periventricular white matter changes
are present on the right as well. The brainstem is normal. Remote
left cerebellar infarct is again seen. Cerebellum is otherwise
within normal limits. The internal auditory canals are within normal
limits.

Vascular: Abnormal signal is present within the vertebral arteries
at the foramen magnum bilaterally. There is flow in the distal
basilar artery. There is no flow in the left internal carotid
artery.

Skull and upper cervical spine: Mild degenerative changes are
present the upper cervical spine. Skull base is normal. Midline
structures are otherwise within normal limits.

Sinuses/Orbits: The paranasal sinuses and mastoid air cells are
clear. The globes and orbits are within normal limits.

MRA HEAD FINDINGS

The right internal carotid artery is within normal limits from the
high cervical segments through the ICA termini. A prominent
posterior communicating artery is patent. The left internal carotid
artery is occluded proximally. Flow is reconstituted in the distal
left ICA at the level of the posterior communicating artery. A1 and
M1 segments are normal. There is reduced size of left MCA branch
vessels compared to the right without a significant focal stenosis
or occlusion.

There is signal loss in the V4 segments of the vertebral arteries
bilaterally, more proximally on the left. There is no flow signal
within the proximal basilar artery. There is flow in the distal
basilar artery. A prominent right posterior communicating artery is
present. Moderate narrowing is present in the left P2 segment. There
is mild irregularity of distal PCA branch vessels without a focal
stenosis or occlusion.
IMPRESSION: 1. Occluded left internal carotid artery with reconstitution of the
level of the left posterior communicating artery. Of note, there is
narrowing within the left posterior communicating artery.
2. No significant stenosis or occlusion of the right internal
carotid artery with a prominent right posterior communicating artery
that is feeding the posterior circulation.
3. High-grade stenosis or occlusion of distal vertebral arteries
bilaterally and of the proximal basilar artery. Flow is
reconstituted in the distal basilar artery, presumably through the
right posterior communicating artery.
4. Asymmetric attenuation of left MCA branch vessels without a focal
stenosis or protrusion. This likely to represents the proximal
occlusion decreased perfusion pressure due.
5. Extensive left hemisphere acute/subacute white matter infarcts
involving the left centrum semi ovale and corona radiata as well as
border zone areas. This corresponds with the proximal left ICA
occlusion.
6. There are scattered punctate foci of cortical infarct involving
the anterior left frontal lobe and left parietal lobe. There is also
some involvement of the cingulate gyrus.

## 2020-12-15 ENCOUNTER — Ambulatory Visit: Payer: Medicaid Other | Admitting: Adult Health

## 2020-12-17 DIAGNOSIS — Z419 Encounter for procedure for purposes other than remedying health state, unspecified: Secondary | ICD-10-CM | POA: Diagnosis not present

## 2020-12-21 ENCOUNTER — Other Ambulatory Visit: Payer: Self-pay | Admitting: Cardiology

## 2020-12-31 ENCOUNTER — Other Ambulatory Visit (INDEPENDENT_AMBULATORY_CARE_PROVIDER_SITE_OTHER): Payer: Self-pay | Admitting: Internal Medicine

## 2020-12-31 DIAGNOSIS — G479 Sleep disorder, unspecified: Secondary | ICD-10-CM

## 2020-12-31 DIAGNOSIS — K5900 Constipation, unspecified: Secondary | ICD-10-CM

## 2020-12-31 DIAGNOSIS — I63512 Cerebral infarction due to unspecified occlusion or stenosis of left middle cerebral artery: Secondary | ICD-10-CM

## 2020-12-31 DIAGNOSIS — I10 Essential (primary) hypertension: Secondary | ICD-10-CM

## 2020-12-31 MED ORDER — ATORVASTATIN CALCIUM 40 MG PO TABS: ORAL_TABLET | ORAL | 0 refills | Status: DC

## 2021-01-03 DIAGNOSIS — N1831 Chronic kidney disease, stage 3a: Secondary | ICD-10-CM | POA: Diagnosis not present

## 2021-01-03 DIAGNOSIS — I129 Hypertensive chronic kidney disease with stage 1 through stage 4 chronic kidney disease, or unspecified chronic kidney disease: Secondary | ICD-10-CM | POA: Diagnosis not present

## 2021-01-03 DIAGNOSIS — D631 Anemia in chronic kidney disease: Secondary | ICD-10-CM | POA: Diagnosis not present

## 2021-01-03 DIAGNOSIS — E559 Vitamin D deficiency, unspecified: Secondary | ICD-10-CM | POA: Diagnosis not present

## 2021-01-03 DIAGNOSIS — Z79899 Other long term (current) drug therapy: Secondary | ICD-10-CM | POA: Diagnosis not present

## 2021-01-03 DIAGNOSIS — R809 Proteinuria, unspecified: Secondary | ICD-10-CM | POA: Diagnosis not present

## 2021-01-04 ENCOUNTER — Telehealth (INDEPENDENT_AMBULATORY_CARE_PROVIDER_SITE_OTHER): Payer: Self-pay

## 2021-01-04 DIAGNOSIS — I10 Essential (primary) hypertension: Secondary | ICD-10-CM

## 2021-01-04 NOTE — Telephone Encounter (Signed)
Patients girlfriend came in today and wanted to know why his Amlodipine 10 mg is not being filled?   I do not see this current medication on his medication list. Girlfriend stated that he is still taking this medication and now she is not sure if he is to continue this or just stop this medication.  Please advise.

## 2021-01-05 MED ORDER — AMLODIPINE BESYLATE 10 MG PO TABS: 10.0000 mg | ORAL_TABLET | Freq: Every day | ORAL | 0 refills | Status: DC

## 2021-01-05 NOTE — Telephone Encounter (Signed)
Patient is taking the Amlodipine 10 mg, this is the bottle that his girlfriend brought in yesterday and confirmed.  Please let me know once sent to pharmacy and I will call to let them know this has been filled. Thank you!

## 2021-01-05 NOTE — Addendum Note (Signed)
Addended by: [name] on: 01/05/2021 10:32 AM   Modules accepted: Orders

## 2021-01-05 NOTE — Telephone Encounter (Signed)
Megan, we will recommend that he continue taking his amlodipine.  I believe he is taking 10 mg by mouth daily.  Please call the patient and verify that that is the dose he is taking, if that is the correct dose that he is taking I will send a refill to the [facility] pharmacy.  Thank you.

## 2021-01-05 NOTE — Telephone Encounter (Signed)
Called patients girlfriend and let her know that the prescription has been filled and sent to [facility] pharmacy. Girlfriend verbalized an understanding.

## 2021-01-05 NOTE — Telephone Encounter (Signed)
Megan, Per chart review it appears that his amlodipine 10 mg by mouth daily was stopped when he was in the emergency department in September.  I think this was because he had a syncopal episode and blood pressure was probably low at that time.  If he has been taking his amlodipine since he has been discharged and continues to tolerate it, I think he can continue taking it.  Per chart review blood pressures have not been especially hypotensive the last few times he has been seen in our office. Thus,  I would recommend that he continue on the amlodipine and if the patient needs a refill please let me know and I will order this to his pharmacy. Let me know his pharmacy preference as well please.  Dr. Karilyn Cota, I would like for you to double check and let me know if you are agreeable with this plan as well.  Thank you.

## 2021-01-05 NOTE — Telephone Encounter (Signed)
If he has been taking amlodipine and his blood pressure is reasonable and not low, he should continue with it.

## 2021-01-05 NOTE — Addendum Note (Signed)
Addended by: Jiles Prows E on: 01/05/2021 11:28 AM   Modules accepted: Orders

## 2021-01-05 NOTE — Telephone Encounter (Signed)
Amlodipine 10mg  tablets, take 1 tablet by mouth daily. Has been prescribed and sent to the [facility] pharmacy in [place].

## 2021-01-10 NOTE — Progress Notes (Signed)
[facility] [address] [place]. [place] [address] [phone]       STROKE FOLLOW UP NOTE  [name] Date of Birth:  [birth year] Medical Record Number:  [id number]   Reason for Referral: stroke follow up    CHIEF COMPLAINT:  Chief Complaint  Patient presents with  . Follow-up    Zachary Mccann 14 with finance (evette) Pt is well, slow process due to having covid as far as moving around.    HPI:  Today, 01/10/2021, Zachary Mccann returns for stroke follow-up accompanied by his fiance.  Unfortunately, shortly after prior visit he was diagnosed with pneumonia secondary to COVID-19 infection on 09/02/2020 requiring a 2-day admission.  He has a reports limited activity and exercise for roughly 1 month around that time and has since had greater difficulty standing and transfers only via wheelchair due to post stroke right hemiparesis.  Dysarthria has been stable without worsening.  Denies new stroke/TIA symptoms.  He has remained on Plavix and atorvastatin for secondary stroke prevention of side effects.  Blood pressure today 137/91.  No further concerns at this time   History provided for reference purposes only Update 08/15/2020 Zachary Mccann: Zachary Mccann is being seen for stroke follow-up accompanied by his girlfriend History of left MCA and ACA scattered infarcts secondary to left ICA occlusion 07/10/2019 with residual right hemiparesis and expressive aphasia History of left cerebellar stroke 08/2018 with residual left-sided deficits Evaluated by Zachary Mccann on 05/11/2020 for progressive deep watershed ischemia and development of left-sided Wallerian degeneration on 03/11/2020 with residual dysarthria and increased right hemiparesis discharged from OT on 07/05/2020 due to meeting rehab potential Discharge from SLP on 05/25/2020 due to limited visits and wanting to focus more on strengthening and ambulation It was recommended to continue working with PT but per epic review, he has not  had consistent follow-up Residual deficits of right hemiparesis, severe dysarthria and verbal apraxia -he does report ongoing improvement Is able to transfer himself from bed to chair. Able to stand to put clothes on - his gf assists him further Right leg tremors upon standing which has been persistent and denies worsening Speech improving - more understandable per gf Denies new or worsening stroke/TIA symptoms Remains on clopidogrel and atorvastatin for secondary stroke prevention without side effects Blood pressure today 132/88 Continues to follow closely with PCP for HTN and HLD management Concerned regarding bilateral lower extremity edema No further concerns at this time  Update 05/11/2020 Zachary Mccann: He was readmitted as a new stroke on 16 March 2020 he presented with severe dysarthria profound right-sided weakness, as a code stroke on the 26 of 2021 head CT was obtained and complaint compared to the previous 12 it showed no acute infarct at first.  There was a positive finding of a progressive deep watershed ischemia and development of left-sided wallerian degeneration since July of the previous year 2020.  Chronic multifocal vessel occlusion and small vessel disease as well.  The CT redemonstrated findings from previously a brain MRI was ordered to make sure that there was no acute change.   The MRI showed no acute infarct but deep watershed ischemia this was a progressive finding the extent of watershed ischemia in comparison to July 2020, small vessel disease.  The patient also had mild aphasia, he did not have headaches, no longer has diplopia his dysarthria has made it difficult for him to speak but he has no problems comprehending speech.  This time his right leg is the most  weak. All pointing to the left middle cerebral artery.    Hospital follow-up 11/26/2019 Zachary Mccann: Zachary Mccann is a 50 year old male who is being seen today for hospital follow-up.  Residual deficits from most recent  stroke include right hemiparesis, and severe expressive aphasia without improvement.  He denies residual dysphagia concerns.  He also continues to have residual left hemiparesis from prior stroke.  He is primarily nonverbal but is able to yes and no appropriately.  Difficulty obtaining large amount of information due to communication barrier but able to ask yes/no questions with appropriate responses.  After inpatient rehab discharge, he was recommended for patient to receive home health therapies but he denies receiving any type of therapies at this time.  He is currently living in his own home with his girlfriend who helps assist with daily needs.  He is nonambulatory and transfers via wheelchair.  He has continued on Plavix without bleeding or bruising.  Has continued on atorvastatin 40 mg daily without myalgias.  Blood pressure today 138/74.  He continues to follow with cardiology regularly.  No further concerns at this time.  Stroke admission summary [name] is a 50 y.o. male with history of ongoing tobacco use, CAD w stent / Mi, hypertension, previous left cerebellar stroke with residual left-sided deficits 08/2018 and non compliant with medications after losing his insurance coverage, who was found to be much more dysarthric with right facial droop  and right-sided weakness.  Stroke work-up revealed left MCA and ACA scattered infarcts as evidenced on MRI due to left ICA occlusion (large vessel disease).  He did not receive IV t-PA due to late presentation (>4.5 hours from time of onset).  MRI showed extensive left hemisphere acute/subacute white matter infarcts involving the left centrum semiovale and corona radiata as well as border zone areas.  MRA showed left ICA occlusion, bilateral VA distal occlusion, BA proximal occlusion with distal retrograde recon from PCOMs.  Carotid Doppler showed left ICA/CCA occlusion.  2D echo showed an EF of 40%.  LDL 90.  A1c 5.6.  UDS positive for THC.   Recommended initiation of Plavix as he has a history of aspirin allergy.  Prior stroke 08/2018 left superior cerebellar infarct as well as evidence of prior strokes.  Hypercoagulable work-up at that time negative.  TEE unremarkable.  30 cardiac event monitor outpatient negative for A. fib.  History of CAD with cardiomyopathy non-STEMI post stent 04/2018 and medication noncompliance due to financial difficulty.  Current tobacco use of smoking cessation counseling provided.  HTN stable and recommended long-term BP goal 130-150 due to left ICA occlusion.  Initiated atorvastatin 40 mg daily.  Other stroke risk factors include intracranial stenosis, previous EtOH use and prior drug use, THC use, obesity, family history of stroke and medication noncompliance.  Residual deficits of dysphagia, expressive aphasia and right hemiparesis and discharged to CIR for ongoing therapies.  He was discharged home with recommendation of ongoing therapies on 08/12/2019 with residual left hemiparesis, right hemiparesis, dysphagia and aphasia.      ROS:   14 system review of systems performed and negative with exception of speech difficulty, gait impairment, weakness  PMH:  Past Medical History:  Diagnosis Date  . CAD (coronary artery disease)    a. s/p NSTEMI in 04/2018 with DES to LCx.   [name] Hypertension   . Myocardial infarction (HCC)   . Pneumonia   . Stroke [facility])     PSH:  Past Surgical History:  Procedure Laterality Date  .  CORONARY STENT INTERVENTION N/A 05/05/2018   Procedure: CORONARY STENT INTERVENTION;  Surgeon: Marykay LexHarding, David W, MD;  [facility];  Service: Cardiovascular;  Laterality: N/A;  . LEFT HEART CATH AND CORONARY ANGIOGRAPHY N/A 05/05/2018   Procedure: LEFT HEART CATH AND CORONARY ANGIOGRAPHY;  Surgeon: Marykay LexHarding, David W, MD;  [facility];  Service: Cardiovascular;  Laterality: N/A;  . TEE WITHOUT CARDIOVERSION N/A 09/08/2018   Procedure: TRANSESOPHAGEAL  ECHOCARDIOGRAM (TEE);  Surgeon: Laurey MoraleMcLean, Dalton S, MD;  [facility];  Service: Cardiovascular;  Laterality: N/A;    Social History:  Social History   Socioeconomic History  . Marital status: Single    Spouse name: Not on file  . Number of children: Not on file  . Years of education: Not on file  . Highest education level: Not on file  Occupational History  . Not on file  Tobacco Use  . Smoking status: Former Smoker    Types: Cigarettes  . Smokeless tobacco: Never Used  . Tobacco comment: smoked for 10-20 years at 1/2 ppd as of 2019  Vaping Use  . Vaping Use: Never used  Substance and Sexual Activity  . Alcohol use: Not Currently  . Drug use: Not Currently  . Sexual activity: Yes  Other Topics Concern  . 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 Has girlfriend. Lives with girlfriend.   Social Determinants of Health   Financial Resource Strain: Not on file  Food Insecurity: Not on file  Transportation Needs: Not on file  Physical Activity: Not on file  Stress: Not on file  Social Connections: Not on file  Intimate Partner Violence: Not on file    Family History:  Family History  Problem Relation Age of Onset  . Hypertension Mother   . Stroke Mother   . Dementia Mother   . Hypertension Father   . Stroke Father   . Cancer Father   . Alcoholism Father   . Cancer Sister     Medications:   Current Outpatient Medications on File Prior to Visit  Medication Sig Dispense Refill  . acetaminophen (TYLENOL) 325 MG tablet Take 2 tablets (650 mg total) by mouth every 6 (six) hours as needed for mild pain (or Fever >/= 101).    [name]. albuterol (VENTOLIN HFA) 108 (90 Base) MCG/ACT inhaler Inhale 2 puffs into the lungs every 6 (six) hours as needed for wheezing or shortness of breath. 8 g 2  . amLODipine (NORVASC) 10 MG tablet Take 1 tablet (10 mg total) by mouth daily. 90 tablet 0  .  atorvastatin (LIPITOR) 40 MG tablet TAKE 1 TABLET BY MOUTH ONCE DAILY AT  6  PM 90 tablet 0  . carvedilol (COREG) 6.25 MG tablet TAKE 1 TABLET BY MOUTH TWICE DAILY WITH MEALS 60 tablet 3  . Cholecalciferol (VITAMIN D-3) 125 MCG (5000 UT) TABS Take 1 tablet by mouth daily. 30 tablet 3  . ciprofloxacin (CIPRO) 500 MG tablet Take 1 tablet (500 mg total) by mouth 2 (two) times daily. For 7 days 14 tablet 0  . clopidogrel (PLAVIX) 75 MG tablet Take 1 tablet by mouth once daily 30 tablet 0  . guaiFENesin (MUCINEX) 600 MG 12 hr tablet Take 1 tablet (600 mg total) by mouth 2 (two) times daily. 60 tablet 2  . hydrALAZINE (APRESOLINE) 25 MG tablet TAKE 1 TABLET BY MOUTH THREE TIMES DAILY 90 tablet 3  . Melatonin  10 MG TABS Take 10 mg by mouth at bedtime. 30 tablet 0  . nystatin (MYCOSTATIN/NYSTOP) powder Apply 1 application topically 3 (three) times daily. 15 g 2  . senna-docusate (SENOKOT-S) 8.6-50 MG tablet Take 2 tablets by mouth at bedtime.    . sildenafil (VIAGRA) 100 MG tablet Take 0.5-1 tablets (50-100 mg total) by mouth daily as needed for erectile dysfunction. 5 tablet 3   No current facility-administered medications on file prior to visit.    Allergies:   Allergies  Allergen Reactions  . Aspirin Swelling     Physical Exam  Vitals:   01/11/21 0945  BP: (!) 137/91  Pulse: 83  Height: 6\' 4"  (1.93 m)   Body mass index is 33.81 kg/m. No exam data present  General: Pleasant morbidly obese middle-aged African-American male, seated, in no evident distress Head: head normocephalic and atraumatic.   Neck: supple with no carotid or supraclavicular bruits Cardiovascular: regular rate and rhythm, no murmurs Musculoskeletal: no deformity Skin:  no rash/petichiae Vascular:  Normal pulses all extremities   Neurologic Exam Mental Status: Awake and fully alert.  Severe dysarthria.  Attempts at speaking in short sentences with great difficulty understanding.  Able to follow commands without  difficulty.  Oriented to place and time with choices and nodding yes/no appropriately to questions.  Unable to fully assess cognition due to communication deficit but appears intact.  Mood and affect appropriate.  Cranial Nerves: Pupils equal, briskly reactive to light. Extraocular movements full without nystagmus. Visual fields full to confrontation. Hearing intact. Facial sensation intact.  Right lower facial droop. Motor: Normal bulk and tone.  RUE: 4/5 with slightly decreased grip strengths and increased tone RLE: 3/5 hip flexor; 4/5 knee extension and flexion; ankle contraction with limited movement LUE: 5/5 LLE: 4/5 hip flexor and ankle dorsiflexion Sensory.: intact to touch , pinprick , position and vibratory sensation.  Coordination: Rapid alternating movements diminished on right. Finger-to-nose performed accurately LUE and heel-to-shin unable to perform adequately due to weakness Gait and Station: Deferred  Reflexes: 1+ and symmetric. Toes downgoing.       ASSESSMENT: DAXTEN CARDONI is a 50 y.o. year old male presented with worsening dysarthria and right facial droop on 07/10/2019 with stroke work-up showing left MCA and ACA scattered infarcts secondary to large vessel disease with left ICA occlusion.  Presented with worsening right-sided weakness with work-up positive for progressive deep watershed ischemia and development of left-sided Wallerian degeneration.  Vascular risk factors include tobacco use, CAD with cardiomyopathy and MI post stent, prior stroke with residual left hemiparesis, HTN, HLD and medication noncompliance due to loss of insurance.  Of note, diagnosed with COVID-19 09/02/2020   PLAN:  1. Left MCA/ACA infarcts:  a. Residual deficits: Right spastic hemiparesis and severe dysarthria.  Likely deconditioning from lack of exercise and physical activity while he was recovering from COVID-19.  Referral placed to restart PT and home rehab.  Will likely benefit from AFO  brace for RLE with HEP deferred to PT.  He declines interest in OT or SLP at this time b. Continue clopidogrel 75 mg daily  and atorvastatin for secondary stroke prevention.  c. Discussed secondary stroke prevention measures and importance of close PCP follow-up for aggressive stroke risk factor management 2. HTN: BP goal<130/90.  Well-controlled monitor by PCP 3. HLD: LDL goal<70.  Recent lipid panel showed LDL 84.  Remains on atorvastatin 40 mg daily per PCP 4. Tobacco use: Endorses cessation and highly encouraged continuation 5. CAD: Continue to  follow with cardiology for ongoing monitoring and management 6. Suspected sleep apnea: needs to schedule sleep study. Will reach out to Zachary Mccann in regards to pursuing sleep study    Follow up in 4 months or call earlier if needed   CC:  [facility] provider: Dr. Peyton Najjar, Nimish C, MD    I spent 30 minutes of face-to-face and non-face-to-face time with patient and wife.  This included previsit chart review, lab review, study review, order entry, electronic health record documentation, patient education regarding history of prior stroke with residual deficits, importance of managing stroke risk factors, and answered all questions to patient satisfaction   Ihor Austin, [facility]  [facility] [address] [place], [place] [address]  Phone [phone] Fax [phone] Note: This document was prepared with digital dictation and possible smart phrase technology. Any transcriptional errors that result from this process are unintentional.

## 2021-01-11 ENCOUNTER — Encounter: Payer: Self-pay | Admitting: Adult Health

## 2021-01-11 ENCOUNTER — Ambulatory Visit: Payer: Medicaid Other | Admitting: Adult Health

## 2021-01-11 VITALS — BP 137/91 | HR 83 | Ht 76.0 in

## 2021-01-11 DIAGNOSIS — I69398 Other sequelae of cerebral infarction: Secondary | ICD-10-CM

## 2021-01-11 DIAGNOSIS — I639 Cerebral infarction, unspecified: Secondary | ICD-10-CM

## 2021-01-11 DIAGNOSIS — R269 Unspecified abnormalities of gait and mobility: Secondary | ICD-10-CM | POA: Diagnosis not present

## 2021-01-11 DIAGNOSIS — G8111 Spastic hemiplegia affecting right dominant side: Secondary | ICD-10-CM | POA: Diagnosis not present

## 2021-01-11 NOTE — Patient Instructions (Signed)
Will look further into doing sleep apnea testing - you will be called to either schedule a f/u visit with Dr. Vickey Huger or if we can proceed with sleep study  Restart physical therapy - you will be called to schedule  Continue clopidogrel 75 mg daily  and atorvastatin  for secondary stroke prevention  Continue to follow up with PCP regarding cholesterol and blood pressure management  Maintain strict control of hypertension with blood pressure goal below 130/90, diabetes with hemoglobin A1c goal below 7% and cholesterol with LDL cholesterol (bad cholesterol) goal below 70 mg/dL.       Followup in the future with me in 6 months or call earlier if needed       Thank you for coming to see [place] at [facility]. I hope we have been able to provide you high quality care today.  You may receive a patient satisfaction survey over the next few weeks. We would appreciate your feedback and comments so that we may continue to improve ourselves and the health of our patients.

## 2021-01-12 ENCOUNTER — Other Ambulatory Visit: Payer: Self-pay | Admitting: Neurology

## 2021-01-12 ENCOUNTER — Encounter: Payer: Self-pay | Admitting: Adult Health

## 2021-01-12 DIAGNOSIS — I639 Cerebral infarction, unspecified: Secondary | ICD-10-CM

## 2021-01-12 DIAGNOSIS — I69398 Other sequelae of cerebral infarction: Secondary | ICD-10-CM

## 2021-01-12 DIAGNOSIS — G479 Sleep disorder, unspecified: Secondary | ICD-10-CM

## 2021-01-12 DIAGNOSIS — I10 Essential (primary) hypertension: Secondary | ICD-10-CM

## 2021-01-12 DIAGNOSIS — R269 Unspecified abnormalities of gait and mobility: Secondary | ICD-10-CM

## 2021-01-17 DIAGNOSIS — Z419 Encounter for procedure for purposes other than remedying health state, unspecified: Secondary | ICD-10-CM | POA: Diagnosis not present

## 2021-01-19 ENCOUNTER — Ambulatory Visit (INDEPENDENT_AMBULATORY_CARE_PROVIDER_SITE_OTHER): Payer: Medicaid Other | Admitting: Internal Medicine

## 2021-01-31 ENCOUNTER — Telehealth: Payer: Self-pay

## 2021-01-31 NOTE — Telephone Encounter (Signed)
Called patient twice and left voicemail to call back and give [place] his Insurance ID number. Card was not scanned and need to get authorization for sleep study.

## 2021-02-04 ENCOUNTER — Other Ambulatory Visit: Payer: Self-pay | Admitting: Cardiology

## 2021-02-14 DIAGNOSIS — Z419 Encounter for procedure for purposes other than remedying health state, unspecified: Secondary | ICD-10-CM | POA: Diagnosis not present

## 2021-03-04 ENCOUNTER — Other Ambulatory Visit: Payer: Self-pay | Admitting: Cardiology

## 2021-03-17 ENCOUNTER — Other Ambulatory Visit: Payer: Self-pay | Admitting: Cardiology

## 2021-03-17 ENCOUNTER — Other Ambulatory Visit: Payer: Self-pay

## 2021-03-17 DIAGNOSIS — Z419 Encounter for procedure for purposes other than remedying health state, unspecified: Secondary | ICD-10-CM | POA: Diagnosis not present

## 2021-03-17 MED ORDER — CLOPIDOGREL BISULFATE 75 MG PO TABS: 75.0000 mg | ORAL_TABLET | Freq: Every day | ORAL | 0 refills | Status: DC

## 2021-03-17 NOTE — Telephone Encounter (Signed)
Patient made apt for 03/28/21. 30 day supply plavix e-scribed

## 2021-03-20 ENCOUNTER — Ambulatory Visit: Payer: Medicaid Other | Attending: Adult Health | Admitting: Physical Therapy

## 2021-03-20 ENCOUNTER — Other Ambulatory Visit: Payer: Self-pay

## 2021-03-20 DIAGNOSIS — R2681 Unsteadiness on feet: Secondary | ICD-10-CM | POA: Diagnosis not present

## 2021-03-20 DIAGNOSIS — I69353 Hemiplegia and hemiparesis following cerebral infarction affecting right non-dominant side: Secondary | ICD-10-CM

## 2021-03-20 DIAGNOSIS — M6281 Muscle weakness (generalized): Secondary | ICD-10-CM | POA: Diagnosis not present

## 2021-03-20 DIAGNOSIS — R2689 Other abnormalities of gait and mobility: Secondary | ICD-10-CM | POA: Diagnosis not present

## 2021-03-20 NOTE — Therapy (Signed)
[facility] [address] [place], [place], [address] Phone: [phone]   Fax:  [phone]  Physical Therapy Evaluation  Patient Details  Name: Zachary Mccann MRN: [id number] Date of Birth: 07/03/1971 Referring Provider (PT): Ihor Austin, NP   Encounter Date: 03/20/2021   PT End of Session - 03/20/21 2201    Visit Number 1    Number of Visits 17    Date for PT Re-Evaluation 05/19/21    Authorization Type Medicaid    PT Start Time 0931    PT Stop Time 1016    PT Time Calculation (min) 45 min    Equipment Utilized During Treatment Gait belt    Activity Tolerance Patient tolerated treatment well    Behavior During Therapy [facility] for tasks assessed/performed           Past Medical History:  Diagnosis Date  . CAD (coronary artery disease)    a. s/p NSTEMI in 04/2018 with DES to LCx.   [name] Hypertension   . Myocardial infarction (HCC)   . Pneumonia   . Stroke [facility])     Past Surgical History:  Procedure Laterality Date  . CORONARY STENT INTERVENTION N/A 05/05/2018   Procedure: CORONARY STENT INTERVENTION;  Surgeon: Marykay Lex, MD;  Location: [facility] INVASIVE CV LAB;  Service: Cardiovascular;  Laterality: N/A;  . LEFT HEART CATH AND CORONARY ANGIOGRAPHY N/A 05/05/2018   Procedure: LEFT HEART CATH AND CORONARY ANGIOGRAPHY;  Surgeon: Marykay Lex, MD;  Location: [facility] INVASIVE CV LAB;  Service: Cardiovascular;  Laterality: N/A;  . TEE WITHOUT CARDIOVERSION N/A 09/08/2018   Procedure: TRANSESOPHAGEAL ECHOCARDIOGRAM (TEE);  Surgeon: Laurey Morale, MD;  Location: [facility] ENDOSCOPY;  Service: Cardiovascular;  Laterality: N/A;    There were no vitals filed for this visit.    Subjective Assessment - 03/20/21 0939    Subjective Pt presents for PT eval accompanied by his girlfriend; she provides history due to pt having dysarthria:  she states pt had Covid in Sept. 2021 and this resulted in deconditioning - pt was standing on  his own before he had Covid and now he is unable to do so - she states his Rt leg gets shaky and she is afraid he will fall; she also reports his Rt ankle will turn -she asks if he needs a brace for his right leg    Patient is accompained by: --   [name] - girlfriend   Pertinent History history of hypertension, tobacco abuse, CAD with non-STEMI 04/2018 as well as left ICA occlusion July 2020 with associated CVA causing right side weakness and slurred speech. New watershed 3/21; Covid 9/21 - hospitalized 9-17- -09-04-20    Patient Stated Goals increase strength, be able to stand and walk as he was able to before Covid    Currently in Pain? No/denies              [facility] PT Assessment - 03/20/21 0942      Assessment   Medical Diagnosis Post CVA with Rt spastic hemiparesis    Referring Provider (PT) Ihor Austin, NP    Onset Date/Surgical Date 03/11/20   Covid 09-02-20   Hand Dominance Left    Prior Therapy Pt was in inpatient rehab with PT, OT, ST in March - April 2021; pt had PT at this facility from March - July 2021      Precautions   Precautions Fall      Restrictions   Weight Bearing Restrictions No  Balance Screen   Has the patient fallen in the past 6 months No    Has the patient had a decrease in activity level because of a fear of falling?  No    Is the patient reluctant to leave their home because of a fear of falling?  No      Home Environment   Living Environment Private residence    Living Arrangements Spouse/significant other    Type of Home House    Home Access Ramped entrance    Home Layout Two level;Full bath on main level;Able to live on main level with bedroom/bathroom      Prior Function   Level of Independence Needs assistance with ADLs;Needs assistance with transfers;Needs assistance with gait      Strength   Overall Strength Deficits    Strength Assessment Site Hip;Knee;Ankle    Right Hip Flexion 3-/5    Right Knee Flexion 2+/5    Right Knee  Extension 4-/5    Left Ankle Dorsiflexion 3-/5    Left Ankle Plantar Flexion 3-/5      Transfers   Transfers Sit to Stand;Stand to Sit;Squat Pivot Transfers    Sit to Stand 2: Max assist    Sit to Stand Details (indicate cue type and reason) from mat to RW with max assist    Stand to Sit 3: Mod assist   2nd person for safety   Squat Pivot Transfers 3: Mod assist      Ambulation/Gait   Ambulation/Gait Yes    Ambulation/Gait Assistance 3: Mod assist    Ambulation Distance (Feet) 15 Feet   20' inside // bars; 15' back to wheelchair   Assistive device Rolling walker    Gait Pattern Step-through pattern;Step-to pattern;Decreased stance time - right;Decreased dorsiflexion - right;Decreased hip/knee flexion - right;Trunk rotated posteriorly on right    Ambulation Surface Level;Indoor      Balance   Balance Assessed Yes      Static Standing Balance   Static Standing - Balance Support Bilateral upper extremity supported    Static Standing - Level of Assistance 4: Min assist    Static Standing - Comment/# of Minutes 1   standing at sink in OT gym area                     Objective measurements completed on examination: See above findings.                 PT Short Term Goals - 03/20/21 2229      PT SHORT TERM GOAL #1   Title Pt will be independent with initial HEP for strengthening and stretching.    Baseline HEP to be established    Time 4    Period Weeks    Status New    Target Date 04/21/21      PT SHORT TERM GOAL #2   Title Pt will perform squat pivot transfers to level surface with min assist.    Baseline mod assist for squat pivot transfers    Time 4    Period Weeks    Status New    Target Date 04/21/20      PT SHORT TERM GOAL #3   Title Pt will be able to perform sit to stand transfer from wheelchair to RW with min assist +1.    Baseline mod assist +2 from mat to RW    Time 4    Period Weeks    Status New  Target Date 04/21/20      PT  SHORT TERM GOAL #4   Title Pt will ambulate 81' with RW with +1 mod assist for improved household mobility.    Baseline 20' with RW with mod assist (2nd person present for safety)    Time 4    Period Weeks    Status New    Target Date 04/21/21      PT SHORT TERM GOAL #5   Title Assess need for Rt AFO and obtain orthotic consult if needed.    Baseline Assessment for AFO to be completed    Time 4    Period Weeks    Status New    Target Date 04/21/21             PT Long Term Goals - 03/20/21 2247      PT LONG TERM GOAL #1   Title Pt will perform sit to stand transfers with +1 min assist.    Baseline max assist    Time 8    Period Weeks    Status New    Target Date 05/19/21      PT LONG TERM GOAL #2   Title Pt will amb. with RW 100' with +1 min assist for household mobility.    Baseline 20' with mod assist with RW    Time 8    Period Weeks    Status New    Target Date 05/19/21      PT LONG TERM GOAL #3   Title Pt will be able to ambulate >100' CGA with RW for further improvement in household mobility.    Baseline 20' mod assist with RW    Time 8    Period Weeks    Status New    Target Date 05/19/21      PT LONG TERM GOAL #4   Title Pt will be able to maintain standing x 5 min with supervision with light UE support on walker or counter for improved balance and participation with ADLs.    Baseline Bil. UE support on counter for 1" with min assist    Time 8    Period Weeks    Status New    Target Date 05/19/21      PT LONG TERM GOAL #5   Title Caregiver will assist pt in performing updated HEP to assist with functional strengthening and stretching RLE.    Baseline Dependent    Time 8    Period Weeks    Status New    Target Date 05/19/21                  Plan - 03/20/21 2205    Clinical Impression Statement Pt is a 50 yr old gentleman with h/o CVA with Rt spastic hemiparesis in March 2021.  Pt had Covid in Sept. 2021 which resulted in deconditioning.   Pt presents with dependencies in transfers and gait with pt requiring max assist for sit to stand transfer and pt amb. with mod assist with RW approx. 15' prior to fatigue.  Pt is unable to stand unsupported; he needs bil. UE support to stand for approx. 1" prior to fatigue.  Pt will benefit from PT to address balance and gait deficits, RLE weakness, and decreased endurance/activity tolerance.    Personal Factors and Comorbidities Comorbidity 3+;Fitness;Time since onset of injury/illness/exacerbation    Comorbidities hypertension, tobacco abuse, CAD with non-STEMI 04/2018 as well as left ICA occlusion July 2020    Examination-Activity  Limitations Bathing;Hygiene/Grooming;Stairs;Bed Mobility;Locomotion Level;Stand;Toileting;Transfers    Examination-Participation Restrictions Community Activity;Cleaning;Meal Prep;Driving;Shop;Laundry    Stability/Clinical Decision Making Evolving/Moderate complexity    Clinical Decision Making Moderate    Rehab Potential Good    PT Frequency 2x / week    PT Duration 8 weeks   10 weeks plus eval   PT Treatment/Interventions ADLs/Self Care Home Management;Moist Heat;Electrical Stimulation;Gait training;DME Instruction;Functional mobility training;Stair training;Neuromuscular re-education;Balance training;Therapeutic exercise;Therapeutic activities;Patient/family education;Orthotic Fit/Training;Vestibular;Passive range of motion;Manual techniques;Wheelchair mobility training    PT Next Visit Plan Issue HEP - RLE strengthening, standing at sink; gait train, transfer training    Recommended Other Services orthotic consult?    Consulted and Agree with Plan of Care Patient;Other (Comment)   girlfriend - [name]          Patient will benefit from skilled therapeutic intervention in order to improve the following deficits and impairments:  Abnormal gait,Decreased activity tolerance,Decreased balance,Decreased endurance,Decreased mobility,Decreased knowledge of use of  DME,Decreased range of motion,Decreased strength,Impaired UE functional use,Impaired tone  Visit Diagnosis: Hemiplegia and hemiparesis following cerebral infarction affecting right non-dominant side (HCC) - Plan: PT plan of care cert/re-cert  Muscle weakness (generalized) - Plan: PT plan of care cert/re-cert  Unsteadiness on feet - Plan: PT plan of care cert/re-cert  Other abnormalities of gait and mobility - Plan: PT plan of care cert/re-cert     Problem List Patient Active Problem List   Diagnosis Date Noted  . CKD (chronic kidney disease) stage 2, GFR 60-89 ml/min 10/18/2020  . COVID-19   . Acute renal failure superimposed on stage 3a chronic kidney disease (HCC)   . Pneumonia due to COVID-19 virus 09/02/2020  . Acute ischemic left middle cerebral artery (MCA) stroke (HCC) 05/11/2020  . Cerebral infarction, watershed distribution, unilateral, acute (HCC) 05/11/2020  . Dysarthria as late effect of cerebrovascular accident (CVA) 05/11/2020  . Visual field constriction, bilateral 05/11/2020  . Hemiparesis affecting right side as late effect of cerebrovascular accident (HCC) 05/11/2020  . Dysarthria, post-stroke 04/07/2020  . 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  . Hyperglycemia   . Anemia   . Acute pain of right knee   . Primary osteoarthritis of right knee   . Expressive language impairment   . Benign essential HTN   . Acute bilat watershed infarction [facility](HCC) 03/16/2020  . Cerebral infarction, watershed distribution, unilateral, chronic 03/16/2020  . Acute renal failure (HCC)   . History of CVA in adulthood   . Dyslipidemia   . CVA (cerebral vascular accident) (HCC) 03/11/2020  . Leukocytosis   . Hemiparesis affecting dominant side as late effect of stroke (HCC)   . Labile blood pressure   . Sleep disturbance   . Benign hypertensive heart and kidney disease with diastolic CHF, NYHA class II and CKD stage III (HCC)   . Chronic systolic congestive heart failure (HCC)   .  Morbid obesity (HCC)   . Uncontrolled hypertension   . Dysphagia, post-stroke   . Cardiomegaly   . Acute systolic congestive heart failure (HCC)   . 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 Cerebral embolism with cerebral infarction 07/11/2019  . Acute CVA (cerebrovascular accident) (HCC) 07/11/2019  . Slurred speech 07/10/2019  . Cardiomyopathy (HCC) 11/20/2018  . History of stroke 09/06/2018  . Tobacco use 09/06/2018  . History of non-ST elevation myocardial infarction (NSTEMI) 05/05/2018  . Severe Vitamin D deficiency 04/02/2018  . Community acquired pneumonia of left lower lobe of lung 04/02/2018  . CKD (chronic kidney disease), stage III (HCC) = Secondary  to Hypertension 04/02/2018  . Pneumonia 03/30/2018  . Metabolic acidosis 03/30/2018  . AKI (acute kidney injury) (HCC) 03/30/2018  . N&V (nausea and vomiting) 03/30/2018  . Essential hypertension 03/30/2018    Kary Kos, PT 03/20/2021, 11:08 PM  [facility] [address] [place], [place], [address] Phone: [phone]   Fax:  [phone]  Name: Zachary Mccann MRN: [id number] Date of Birth: 13-Aug-1971

## 2021-03-22 ENCOUNTER — Other Ambulatory Visit (INDEPENDENT_AMBULATORY_CARE_PROVIDER_SITE_OTHER): Payer: Self-pay

## 2021-03-22 DIAGNOSIS — I10 Essential (primary) hypertension: Secondary | ICD-10-CM

## 2021-03-22 DIAGNOSIS — K5900 Constipation, unspecified: Secondary | ICD-10-CM

## 2021-03-22 DIAGNOSIS — G479 Sleep disorder, unspecified: Secondary | ICD-10-CM

## 2021-03-22 DIAGNOSIS — I63512 Cerebral infarction due to unspecified occlusion or stenosis of left middle cerebral artery: Secondary | ICD-10-CM

## 2021-03-22 MED ORDER — CARVEDILOL 6.25 MG PO TABS: 6.2500 mg | ORAL_TABLET | Freq: Two times a day (BID) | ORAL | 3 refills | Status: DC

## 2021-03-23 ENCOUNTER — Other Ambulatory Visit: Payer: Self-pay | Admitting: *Deleted

## 2021-03-23 ENCOUNTER — Other Ambulatory Visit: Payer: Self-pay

## 2021-03-23 NOTE — Patient Outreach (Addendum)
Medicaid Managed Care   Nurse Care Manager Note  03/23/2021 Name:  CANTON YEARBY MRN:  [id number] DOB:  May 05, 1971  [name] is an 50 y.o. year old male who is a primary patient of Wilson Singer, MD.  The Medicaid Managed Care Coordination team was consulted for assistance with:    CVA  Mr. Viscomi was given information about Medicaid Managed Care Coordination team services today. [name] agreed to services and verbal consent obtained.  Engaged with patient by telephone for initial visit in response to provider referral for case management and/or care coordination services.   Assessments/Interventions:  Review of past medical history, allergies, medications, health status, including review of consultants reports, laboratory and other test data, was performed as part of comprehensive evaluation and provision of chronic care management services.  SDOH (Social Determinants of Health) assessments and interventions performed:   Care Plan  Allergies  Allergen Reactions  . Aspirin Swelling    Medications Reviewed Today    Reviewed by Heidi Dach, RN (Registered Nurse) on 03/23/21 at [phone]  Med List Status: <None>  Medication Order Taking? Sig Documenting Provider Last Dose Status Informant  acetaminophen (TYLENOL) 325 MG tablet [id number] Yes Take 2 tablets (650 mg total) by mouth every 6 (six) hours as needed for mild pain (or Fever >/= 101). Charlton Amor, PA-C Taking Active Spouse/Significant Other           Med Note Iran Ouch, AMBER C   Fri Sep 02, 2020  4:51 PM)    albuterol (VENTOLIN HFA) 108 (90 Base) MCG/ACT inhaler [id number] No Inhale 2 puffs into the lungs every 6 (six) hours as needed for wheezing or shortness of breath.  Patient not taking: Reported on 03/23/2021   Erick Blinks, MD Not Taking Active            Med Note (Zannie Runkle A   Thu Mar 23, 2021  9:16 AM) Patient was unaware  amLODipine (NORVASC) 10 MG tablet [id number] Yes Take 1 tablet  (10 mg total) by mouth daily. [name], NP Taking Active   atorvastatin (LIPITOR) 40 MG tablet [id number] Yes TAKE 1 TABLET BY MOUTH ONCE DAILY AT  6  PM Gosrani, Nimish C, MD Taking Active   carvedilol (COREG) 6.25 MG tablet [id number] Yes Take 1 tablet (6.25 mg total) by mouth 2 (two) times daily with a meal. Gosrani, Nimish C, MD Taking Active   Cholecalciferol (VITAMIN D-3) 125 MCG (5000 UT) TABS [id number] Yes Take 1 tablet by mouth daily. [name], NP Taking Active   ciprofloxacin (CIPRO) 500 MG tablet [id number] No Take 1 tablet (500 mg total) by mouth 2 (two) times daily. For 7 days  Patient not taking: Reported on 03/23/2021   [name], NP Not Taking Active            Med Note (Dorthea Maina A   Thu Mar 23, 2021  9:19 AM) completed  clopidogrel (PLAVIX) 75 MG tablet [id number] Yes Take 1 tablet (75 mg total) by mouth daily. Antoine Poche, MD Taking Active   guaiFENesin (MUCINEX) 600 MG 12 hr tablet [id number] No Take 1 tablet (600 mg total) by mouth 2 (two) times daily.  Patient not taking: Reported on 03/23/2021   Erick Blinks, MD Not Taking Active   hydrALAZINE (APRESOLINE) 25 MG tablet [id number] Yes TAKE 1 TABLET BY MOUTH THREE TIMES DAILY Wilson Singer, MD Taking Active   Melatonin 10 MG TABS [id number] Yes Take  10 mg by mouth at bedtime. Charlton Amor, PA-C Taking Active Spouse/Significant Other           Med Note Iran Ouch, AMBER C   Fri Sep 02, 2020  4:51 PM)    nystatin (MYCOSTATIN/NYSTOP) powder [id number] Yes Apply 1 application topically 3 (three) times daily. [name], NP Taking Active   senna-docusate (SENOKOT-S) 8.6-50 MG tablet [id number] No Take 2 tablets by mouth at bedtime.  Patient not taking: Reported on 03/23/2021   [name], NP Not Taking Active Spouse/Significant Other           Med Note Iran Ouch, AMBER C   Fri Sep 02, 2020  4:52 PM)    sildenafil (VIAGRA) 100 MG tablet [id number] No Take 0.5-1 tablets (50-100 mg total) by mouth daily as  needed for erectile dysfunction.  Patient not taking: Reported on 03/23/2021   [name], NP Not Taking Active           Patient Active Problem List   Diagnosis Date Noted  . CKD (chronic kidney disease) stage 2, GFR 60-89 ml/min 10/18/2020  . COVID-19   . Acute renal failure superimposed on stage 3a chronic kidney disease (HCC)   . Pneumonia due to COVID-19 virus 09/02/2020  . Acute ischemic left middle cerebral artery (MCA) stroke (HCC) 05/11/2020  . Cerebral infarction, watershed distribution, unilateral, acute (HCC) 05/11/2020  . Dysarthria as late effect of cerebrovascular accident (CVA) 05/11/2020  . Visual field constriction, bilateral 05/11/2020  . Hemiparesis affecting right side as late effect of cerebrovascular accident (HCC) 05/11/2020  . Dysarthria, post-stroke 04/07/2020  . 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  . Hyperglycemia   . Anemia   . Acute pain of right knee   . Primary osteoarthritis of right knee   . Expressive language impairment   . Benign essential HTN   . Acute bilat watershed infarction [facility]) 03/16/2020  . Cerebral infarction, watershed distribution, unilateral, chronic 03/16/2020  . Acute renal failure (HCC)   . History of CVA in adulthood   . Dyslipidemia   . CVA (cerebral vascular accident) (HCC) 03/11/2020  . Leukocytosis   . Hemiparesis affecting dominant side as late effect of stroke (HCC)   . Labile blood pressure   . Sleep disturbance   . Benign hypertensive heart and kidney disease with diastolic CHF, NYHA class II and CKD stage III (HCC)   . Chronic systolic congestive heart failure (HCC)   . Morbid obesity (HCC)   . Uncontrolled hypertension   . Dysphagia, post-stroke   . Cardiomegaly   . Acute systolic congestive heart failure (HCC)   . 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 Cerebral embolism with cerebral infarction 07/11/2019  . Acute CVA (cerebrovascular accident) (HCC) 07/11/2019  . Slurred speech 07/10/2019   . Cardiomyopathy (HCC) 11/20/2018  . History of stroke 09/06/2018  . Tobacco use 09/06/2018  . History of non-ST elevation myocardial infarction (NSTEMI) 05/05/2018  . Severe Vitamin D deficiency 04/02/2018  . Community acquired pneumonia of left lower lobe of lung 04/02/2018  . CKD (chronic kidney disease), stage III (HCC) = Secondary to Hypertension 04/02/2018  . Pneumonia 03/30/2018  . Metabolic acidosis 03/30/2018  . AKI (acute kidney injury) (HCC) 03/30/2018  . N&V (nausea and vomiting) 03/30/2018  . Essential hypertension 03/30/2018    Conditions to be addressed/monitored per PCP order:  CVA  Care Plan : Stroke (Adult)  Updates made by Heidi Dach, RN since 03/23/2021 12:00 AM    Problem:  Recurrence (Stroke)     Long-Range Goal: Stroke Recurrence Prevented or Minimized   Start Date: 03/23/2021  Expected End Date: 06/22/2021  This Visit's Progress: On track  Priority: High  Note:   Current Barriers:   Ineffective Self Health Maintenance-Mr. Hinnant has a history of 2 strokes (2019 and 2021). He is managing his health with assistance of [name], his significant other. He recently restarted PT and has been referred for a sleep study. His next PCP appointment is 04/12/21. He has a wheelchair that is in need of repair(missing leg rest, screws and the seat is falling apart). He is trying to improve his health and avoid another stroke.  Unable to perform ADLs independently  Unable to perform IADLs independently  Currently UNABLE TO independently self manage needs related to chronic health conditions.   Knowledge Deficits related to short term plan for care coordination needs and long term plans for chronic disease management needs Nurse Case Manager Clinical Goal(s):   patient will work with care management team to address care coordination and chronic disease management needs related to Disease Management  Care Coordination   Interventions:   Evaluation of current  treatment plan related to stroke history and patient's adherence to plan as established by provider.  Provided education to patient re: heart healthy diet  Reviewed medications with patient and discussed the benefits of consult with [facility] pharmacist  Collaborated with DME supplier ([employer] [phone]) regarding wheelchair needs-Spoke with Star at [employer], an order was placed for a technician to evaluate the wheelchair for repair vs replacement. They will call on 03/24/21 to arrange evaluation  Discussed plans with patient for ongoing care management follow up and provided patient with direct contact information for care management team  Reviewed scheduled/upcoming provider appointments including: upcoming PT appointments, Cardiac 4/12, and PCP on 4/27  Pharmacy referral for medication management  Provided patient with the contact information for medical transportation provided by [facility] [phone] Self Care Activities:  . Patient will self administer medications as prescribed . Patient will attend all scheduled provider appointments . Patient will call pharmacy for medication refills . Patient will call provider office for new concerns or questions Patient Goals: -- attend 90 percent of physical therapy appointments - eat healthy to increase strength - increase activity or exercise time a little every week  - call to cancel if needed - keep a calendar with prescription refill dates - keep a calendar with appointment dates  - call [phone], 2-3 days before appointment for medical transportation provided by [facility] - follow up with sleep study referral Follow Up Plan: Telephone follow up appointment with care management team member scheduled for:05/02/21 @ 9am       Follow Up:  Patient agrees to Care Plan and Follow-up.  Plan: The Managed Medicaid care management team will reach out to the patient again over the next 30 days.  Date/time of next scheduled RN care  management/care coordination outreach: 05/02/21 @ 9am  [name] RN, BSN [place]  [employer]

## 2021-03-23 NOTE — Patient Instructions (Addendum)
Visit Information  [name] was given information about Medicaid Managed Care team care coordination services as a part of their [facility] Medicaid benefit. Zachary Mccann verbally consented to engagement with the [facility] Managed Care team.   For questions related to your [facility] health plan, please call: [phone]  If you would like to schedule transportation through your [facility] plan, please call the following number at least 2 days in advance of your appointment: [phone]   Call the [facility] Crisis Line at [phone], at any time, 24 hours a day, 7 days a week. If you are in danger or need immediate medical attention call 911.  Zachary Mccann - following are the goals we discussed in your visit today:  Goals Addressed            This Visit's Progress   . Keep or Improve My Strength-Stroke       Timeframe:  Long-Range Goal Priority:  High Start Date:   03/23/21                          Expected End Date:   06/22/21                    Follow Up Date 05/02/21   - attend 90 percent of physical therapy appointments - eat healthy to increase strength - increase activity or exercise time a little every week    Why is this important?    Before the stroke you probably did not think much about being safe when you are up and about.   Now, it may be harder for you to get around.   It may also be easier for you to trip or fall.   It is common to have muscle weakness after a stroke. You may also feel like you cannot control an arm or leg.   It will be helpful to work with a physical therapist to get your strength and muscle control back.   It is good to stay as active as you can. Walking and stretching help you stay strong and flexible.   The physical therapist will develop an exercise program just for you.         . Make and Keep All Appointments       Timeframe:  Long-Range Goal Priority:  High Start Date:   03/23/21                           Expected End Date: 06/22/21                      Follow Up Date 05/02/21    - call to cancel if needed - keep a calendar with prescription refill dates - keep a calendar with appointment dates  - call [phone], 2-3 days before appointment for medical transportation provided by [facility] - follow up with sleep study referral   Why is this important?    Part of staying healthy is seeing the doctor for follow-up care.   If you forget your appointments, there are some things you can do to stay on track.           Please see education materials related to heart healthy diet provided by MyChart link. and as Financial risk analyst.     Heart-Healthy Eating Plan Heart-healthy meal planning includes:  Eating less unhealthy fats.  Eating more healthy fats.  Making  other changes in your diet. Talk with your doctor or a diet specialist (dietitian) to create an eating plan that is right for you. What are tips for following this plan? Cooking Avoid frying your food. Try to bake, boil, grill, or broil it instead. You can also reduce fat by:  Removing the skin from poultry.  Removing all visible fats from meats.  Steaming vegetables in water or broth. Meal planning  At meals, divide your plate into four equal parts: ? Fill one-half of your plate with vegetables and green salads. ? Fill one-fourth of your plate with whole grains. ? Fill one-fourth of your plate with lean protein foods.  Eat 4-5 servings of vegetables per day. A serving of vegetables is: ? 1 cup of raw or cooked vegetables. ? 2 cups of raw leafy greens.  Eat 4-5 servings of fruit per day. A serving of fruit is: ? 1 medium whole fruit. ?  cup of dried fruit. ?  cup of fresh, frozen, or canned fruit. ?  cup of 100% fruit juice.  Eat more foods that have soluble fiber. These are apples, broccoli, carrots, beans, peas, and barley. Try to get 20-30 g of fiber per day.  Eat 4-5 servings of nuts, legumes, and  seeds per week: ? 1 serving of dried beans or legumes equals  cup after being cooked. ? 1 serving of nuts is  cup. ? 1 serving of seeds equals 1 tablespoon.   General information  Eat more home-cooked food. Eat less restaurant, buffet, and fast food.  Limit or avoid alcohol.  Limit foods that are high in starch and sugar.  Avoid fried foods.  Lose weight if you are overweight.  Keep track of how much salt (sodium) you eat. This is important if you have high blood pressure. Ask your doctor to tell you more about this.  Try to add vegetarian meals each week. Fats  Choose healthy fats. These include olive oil and canola oil, flaxseeds, walnuts, almonds, and seeds.  Eat more omega-3 fats. These include salmon, mackerel, sardines, tuna, flaxseed oil, and ground flaxseeds. Try to eat fish at least 2 times each week.  Check food labels. Avoid foods with trans fats or high amounts of saturated fat.  Limit saturated fats. ? These are often found in animal products, such as meats, butter, and cream. ? These are also found in plant foods, such as palm oil, palm kernel oil, and coconut oil.  Avoid foods with partially hydrogenated oils in them. These have trans fats. Examples are stick margarine, some tub margarines, cookies, crackers, and other baked goods. What foods can I eat? Fruits All fresh, canned (in natural juice), or frozen fruits. Vegetables Fresh or frozen vegetables (raw, steamed, roasted, or grilled). Green salads. Grains Most grains. Choose whole wheat and whole grains most of the time. Rice and pasta, including brown rice and pastas made with whole wheat. Meats and other proteins Lean, well-trimmed beef, veal, pork, and lamb. Chicken and [place] without skin. All fish and shellfish. Wild duck, rabbit, pheasant, and venison. Egg whites or low-cholesterol egg substitutes. Dried beans, peas, lentils, and tofu. Seeds and most nuts. Dairy Low-fat or nonfat cheeses, including  ricotta and mozzarella. Skim or 1% milk that is liquid, powdered, or evaporated. Buttermilk that is made with low-fat milk. Nonfat or low-fat yogurt. Fats and oils Non-hydrogenated (trans-free) margarines. Vegetable oils, including soybean, sesame, sunflower, olive, peanut, safflower, corn, canola, and cottonseed. Salad dressings or mayonnaise made with a vegetable oil.  Beverages Mineral water. Coffee and tea. Diet carbonated beverages. Sweets and desserts Sherbet, gelatin, and fruit ice. Small amounts of dark chocolate. Limit all sweets and desserts. Seasonings and condiments All seasonings and condiments. The items listed above may not be a complete list of foods and drinks you can eat. Contact a dietitian for more options. What foods should I avoid? Fruits Canned fruit in heavy syrup. Fruit in cream or butter sauce. Fried fruit. Limit coconut. Vegetables Vegetables cooked in cheese, cream, or butter sauce. Fried vegetables. Grains Breads that are made with saturated or trans fats, oils, or whole milk. Croissants. Sweet rolls. Donuts. High-fat crackers, such as cheese crackers. Meats and other proteins Fatty meats, such as hot dogs, ribs, sausage, bacon, rib-eye roast or steak. High-fat deli meats, such as salami and bologna. Caviar. Domestic duck and goose. Organ meats, such as liver. Dairy Cream, sour cream, cream cheese, and creamed cottage cheese. Whole-milk cheeses. Whole or 2% milk that is liquid, evaporated, or condensed. Whole buttermilk. Cream sauce or high-fat cheese sauce. Yogurt that is made from whole milk. Fats and oils Meat fat, or shortening. Cocoa butter, hydrogenated oils, palm oil, coconut oil, palm kernel oil. Solid fats and shortenings, including bacon fat, salt pork, lard, and butter. Nondairy cream substitutes. Salad dressings with cheese or sour cream. Beverages Regular sodas and juice drinks with added sugar. Sweets and desserts Frosting. Pudding. Cookies. Cakes.  Pies. Milk chocolate or white chocolate. Buttered syrups. Full-fat ice cream or ice cream drinks. The items listed above may not be a complete list of foods and drinks to avoid. Contact a dietitian for more information. Summary  Heart-healthy meal planning includes eating less unhealthy fats, eating more healthy fats, and making other changes in your diet.  Eat a balanced diet. This includes fruits and vegetables, low-fat or nonfat dairy, lean protein, nuts and legumes, whole grains, and heart-healthy oils and fats. This information is not intended to replace advice given to you by your health care provider. Make sure you discuss any questions you have with your health care provider. Document Revised: 02/06/2018 Document Reviewed: 01/10/2018 Elsevier Patient Education  2021 [employer].   The patient verbalized understanding of instructions provided today and agreed to receive a mailed copy of patient instruction and/or educational materials.  Telephone follow up appointment with Managed Medicaid care management team member scheduled for:05/02/21 @ 9am  [name] RN, BSN [place]  [facility] RN Care Coordinator   Following is a copy of your plan of care:  Patient Care Plan: Stroke (Adult)    Problem Identified: Recurrence (Stroke)     Long-Range Goal: Stroke Recurrence Prevented or Minimized   Start Date: 03/23/2021  Expected End Date: 06/22/2021  This Visit's Progress: On track  Priority: High  Note:   Current Barriers:   Ineffective Self Health Maintenance-Zachary Mccann has a history of 2 strokes (2019 and 2021). He is managing his health with assistance of [name], his significant other. He recently restarted PT and has been referred for a sleep study. His next PCP appointment is 04/12/21. He has a wheelchair that is in need of repair(missing leg rest, screws and the seat is falling apart). He is trying to improve his health and avoid another stroke.  Unable to  perform ADLs independently  Unable to perform IADLs independently  Currently UNABLE TO independently self manage needs related to chronic health conditions.   Knowledge Deficits related to short term plan for care coordination needs and long term plans  for chronic disease management needs Nurse Case Manager Clinical Goal(s):   patient will work with care management team to address care coordination and chronic disease management needs related to Disease Management  Care Coordination   Interventions:   Evaluation of current treatment plan related to stroke history and patient's adherence to plan as established by provider.  Provided education to patient re: heart healthy diet  Reviewed medications with patient and discussed the benefits of consult with MM pharmacist  Collaborated with DME supplier ([employer] [phone]) regarding wheelchair needs-Spoke with Star at [employer], an order was placed for a technician to evaluate the wheelchair for repair vs replacement. They will call on 03/24/21 to arrange evaluation  Discussed plans with patient for ongoing care management follow up and provided patient with direct contact information for care management team  Reviewed scheduled/upcoming provider appointments including: upcoming PT appointments, Cardiac 4/12, and PCP on 4/27  Pharmacy referral for medication management  Provided patient with the contact information for medical transportation provided by [facility] [phone] Self Care Activities:  . Patient will self administer medications as prescribed . Patient will attend all scheduled provider appointments . Patient will call pharmacy for medication refills . Patient will call provider office for new concerns or questions Patient Goals: -- attend 90 percent of physical therapy appointments - eat healthy to increase strength - increase activity or exercise time a little every week  - call to cancel if needed - keep a calendar with  prescription refill dates - keep a calendar with appointment dates  - call [phone], 2-3 days before appointment for medical transportation provided by [facility] - follow up with sleep study referral Follow Up Plan: Telephone follow up appointment with care management team member scheduled for:05/02/21 @ 9am

## 2021-03-24 ENCOUNTER — Telehealth: Payer: Self-pay | Admitting: Internal Medicine

## 2021-03-24 ENCOUNTER — Ambulatory Visit: Payer: Medicaid Other

## 2021-03-24 ENCOUNTER — Other Ambulatory Visit: Payer: Self-pay

## 2021-03-24 DIAGNOSIS — R2689 Other abnormalities of gait and mobility: Secondary | ICD-10-CM

## 2021-03-24 DIAGNOSIS — M6281 Muscle weakness (generalized): Secondary | ICD-10-CM | POA: Diagnosis not present

## 2021-03-24 DIAGNOSIS — R2681 Unsteadiness on feet: Secondary | ICD-10-CM | POA: Diagnosis not present

## 2021-03-24 DIAGNOSIS — I69353 Hemiplegia and hemiparesis following cerebral infarction affecting right non-dominant side: Secondary | ICD-10-CM | POA: Diagnosis not present

## 2021-03-24 NOTE — Telephone Encounter (Signed)
I called Mr.Neuharth to get him scheduled with the Managed Medicaid Pharmacist and I had to leave him a message. I left my contact info on his VM.

## 2021-03-24 NOTE — Therapy (Signed)
[facility] [address] [place], [place], [address] Phone: [phone]   Fax:  [phone]  Physical Therapy Treatment  Patient Details  Name: Zachary Mccann MRN: [id number] Date of Birth: 01/18/1971 Referring Provider (PT): Ihor Austin, NP   Encounter Date: 03/24/2021   PT End of Session - 03/24/21 1023    Visit Number 2    Number of Visits 17    Date for PT Re-Evaluation 05/19/21    Authorization Type Medicaid 4/8-6/20 16 visits    Authorization - Visit Number 1    Authorization - Number of Visits 16    PT Start Time 1019    PT Stop Time 1057    PT Time Calculation (min) 38 min    Equipment Utilized During Treatment Gait belt    Activity Tolerance Patient tolerated treatment well    Behavior During Therapy WFL for tasks assessed/performed           Past Medical History:  Diagnosis Date  . CAD (coronary artery disease)    a. s/p NSTEMI in 04/2018 with DES to LCx.   [name] Hypertension   . Myocardial infarction (HCC)   . Pneumonia   . Stroke [facility])     Past Surgical History:  Procedure Laterality Date  . CORONARY STENT INTERVENTION N/A 05/05/2018   Procedure: CORONARY STENT INTERVENTION;  Surgeon: Marykay Lex, MD;  Location: [facility] INVASIVE CV LAB;  Service: Cardiovascular;  Laterality: N/A;  . LEFT HEART CATH AND CORONARY ANGIOGRAPHY N/A 05/05/2018   Procedure: LEFT HEART CATH AND CORONARY ANGIOGRAPHY;  Surgeon: Marykay Lex, MD;  Location: [facility] INVASIVE CV LAB;  Service: Cardiovascular;  Laterality: N/A;  . TEE WITHOUT CARDIOVERSION N/A 09/08/2018   Procedure: TRANSESOPHAGEAL ECHOCARDIOGRAM (TEE);  Surgeon: Laurey Morale, MD;  Location: [facility] ENDOSCOPY;  Service: Cardiovascular;  Laterality: N/A;    There were no vitals filed for this visit.   Subjective Assessment - 03/24/21 1023    Subjective Pt's girlfriend reports that an [employer] employee is supposed to be coming by today about the w/c.    Patient is  accompained by: --   Stasia Cavalier - girlfriend   Pertinent History history of hypertension, tobacco abuse, CAD with non-STEMI 04/2018 as well as left ICA occlusion July 2020 with associated CVA causing right side weakness and slurred speech. New watershed 3/21; Covid 9/21 - hospitalized 9-17- -09-04-20    Patient Stated Goals increase strength, be able to stand and walk as he was able to before Covid    Currently in Pain? No/denies                             [facility] Adult PT Treatment/Exercise - 03/24/21 1026      Transfers   Transfers Sit to Stand;Stand to [employer] Transfers    Sit to Stand 2: Max assist   +2 assist from w/c with pillow in it. CGA/min assist from elevated mat   Sit to Stand Details Verbal cues for technique    Sit to Stand Details (indicate cue type and reason) Pt was cued to push with both hands from chair and lean forward versus only with left and turning to that side with rising to try to increase right weight shift some. Easier time rising the 2nd time when did this more mod assist +2.    Stand to Sit 3: Mod assist   2nd person for safety   Stand to  Sit Details (indicate cue type and reason) Verbal cues for technique    Stand Pivot Transfers 2: Max assist   +2   Stand Pivot Transfer Details (indicate cue type and reason) w/c to mat using walker. Max assist to rise then min assist to turn with walker.    Comments Sit to stand from 23" mat x 5 with verbal cues to push from mat with both hands and lean foward CGA/min assist. Then repeated from 22" mat. Pt was also cued to try to reach back with right arm as well to prevent twisting when going to sit. HR=74 after.      Ambulation/Gait   Ambulation/Gait Yes    Ambulation/Gait Assistance 4: Min assist;4: Min guard   2nd person just for safety with wife following with w/c   Ambulation/Gait Assistance Details Verbal cues to stay up tall in walker and try to stay up tall through RLE    Ambulation Distance (Feet)  15 Feet   x 2   Assistive device Rolling walker   bariatric   Gait Pattern Step-to pattern;Step-through pattern;Decreased step length - right;Decreased step length - left;Decreased hip/knee flexion - right;Decreased stance time - right    Ambulation Surface Level;Indoor                    PT Short Term Goals - 03/20/21 2229      PT SHORT TERM GOAL #1   Title Pt will be independent with initial HEP for strengthening and stretching.    Baseline HEP to be established    Time 4    Period Weeks    Status New    Target Date 04/21/21      PT SHORT TERM GOAL #2   Title Pt will perform squat pivot transfers to level surface with min assist.    Baseline mod assist for squat pivot transfers    Time 4    Period Weeks    Status New    Target Date 04/21/20      PT SHORT TERM GOAL #3   Title Pt will be able to perform sit to stand transfer from wheelchair to RW with min assist +1.    Baseline mod assist +2 from mat to RW    Time 4    Period Weeks    Status New    Target Date 04/21/20      PT SHORT TERM GOAL #4   Title Pt will ambulate 31' with RW with +1 mod assist for improved household mobility.    Baseline 20' with RW with mod assist (2nd person present for safety)    Time 4    Period Weeks    Status New    Target Date 04/21/21      PT SHORT TERM GOAL #5   Title Assess need for Rt AFO and obtain orthotic consult if needed.    Baseline Assessment for AFO to be completed    Time 4    Period Weeks    Status New    Target Date 04/21/21             PT Long Term Goals - 03/20/21 2247      PT LONG TERM GOAL #1   Title Pt will perform sit to stand transfers with +1 min assist.    Baseline max assist    Time 8    Period Weeks    Status New    Target Date 05/19/21  PT LONG TERM GOAL #2   Title Pt will amb. with RW 100' with +1 min assist for household mobility.    Baseline 20' with mod assist with RW    Time 8    Period Weeks    Status New    Target Date  05/19/21      PT LONG TERM GOAL #3   Title Pt will be able to ambulate >100' CGA with RW for further improvement in household mobility.    Baseline 20' mod assist with RW    Time 8    Period Weeks    Status New    Target Date 05/19/21      PT LONG TERM GOAL #4   Title Pt will be able to maintain standing x 5 min with supervision with light UE support on walker or counter for improved balance and participation with ADLs.    Baseline Bil. UE support on counter for 1" with min assist    Time 8    Period Weeks    Status New    Target Date 05/19/21      PT LONG TERM GOAL #5   Title Caregiver will assist pt in performing updated HEP to assist with functional strengthening and stretching RLE.    Baseline Dependent    Time 8    Period Weeks    Status New    Target Date 05/19/21                 Plan - 03/24/21 2003    Clinical Impression Statement PT focused on sit to stand transfers from different heights. Did well from elevated mat and was able to improve performance when pushed with both hands from seate and leaned forward more. Pt able to clear right foot fairly well but has decreased hip/knee flexion. Will continue to assess if has AFO need.    Personal Factors and Comorbidities Comorbidity 3+;Fitness;Time since onset of injury/illness/exacerbation    Comorbidities hypertension, tobacco abuse, CAD with non-STEMI 04/2018 as well as left ICA occlusion July 2020    Examination-Activity Limitations Bathing;Hygiene/Grooming;Stairs;Bed Mobility;Locomotion Level;Stand;Toileting;Transfers    Examination-Participation Restrictions Community Activity;Cleaning;Meal Prep;Driving;Shop;Laundry    Stability/Clinical Decision Making Evolving/Moderate complexity    Rehab Potential Good    PT Frequency 2x / week    PT Duration 8 weeks   10 weeks plus eval   PT Treatment/Interventions ADLs/Self Care Home Management;Moist Heat;Electrical Stimulation;Gait training;DME Instruction;Functional  mobility training;Stair training;Neuromuscular re-education;Balance training;Therapeutic exercise;Therapeutic activities;Patient/family education;Orthotic Fit/Training;Vestibular;Passive range of motion;Manual techniques;Wheelchair mobility training    PT Next Visit Plan What is story with w/c? Issue HEP - RLE strengthening, standing at sink; gait train, transfer training    Consulted and Agree with Plan of Care Patient;Other (Comment)   girlfriend - [name]          Patient will benefit from skilled therapeutic intervention in order to improve the following deficits and impairments:  Abnormal gait,Decreased activity tolerance,Decreased balance,Decreased endurance,Decreased mobility,Decreased knowledge of use of DME,Decreased range of motion,Decreased strength,Impaired UE functional use,Impaired tone  Visit Diagnosis: Other abnormalities of gait and mobility  Muscle weakness (generalized)     Problem List Patient Active Problem List   Diagnosis Date Noted  . CKD (chronic kidney disease) stage 2, GFR 60-89 ml/min 10/18/2020  . COVID-19   . Acute renal failure superimposed on stage 3a chronic kidney disease (HCC)   . Pneumonia due to COVID-19 virus 09/02/2020  . Acute ischemic left middle cerebral artery (MCA) stroke (HCC) 05/11/2020  . Cerebral  infarction, watershed distribution, unilateral, acute (HCC) 05/11/2020  . Dysarthria as late effect of cerebrovascular accident (CVA) 05/11/2020  . Visual field constriction, bilateral 05/11/2020  . Hemiparesis affecting right side as late effect of cerebrovascular accident (HCC) 05/11/2020  . Dysarthria, post-stroke 04/07/2020  . 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  . Hyperglycemia   . Anemia   . Acute pain of right knee   . Primary osteoarthritis of right knee   . Expressive language impairment   . Benign essential HTN   . Acute bilat watershed infarction [facility]) 03/16/2020  . Cerebral infarction, watershed distribution, unilateral,  chronic 03/16/2020  . Acute renal failure (HCC)   . History of CVA in adulthood   . Dyslipidemia   . CVA (cerebral vascular accident) (HCC) 03/11/2020  . Leukocytosis   . Hemiparesis affecting dominant side as late effect of stroke (HCC)   . Labile blood pressure   . Sleep disturbance   . Benign hypertensive heart and kidney disease with diastolic CHF, NYHA class II and CKD stage III (HCC)   . Chronic systolic congestive heart failure (HCC)   . Morbid obesity (HCC)   . Uncontrolled hypertension   . Dysphagia, post-stroke   . Cardiomegaly   . Acute systolic congestive heart failure (HCC)   . 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 Cerebral embolism with cerebral infarction 07/11/2019  . Acute CVA (cerebrovascular accident) (HCC) 07/11/2019  . Slurred speech 07/10/2019  . Cardiomyopathy (HCC) 11/20/2018  . History of stroke 09/06/2018  . Tobacco use 09/06/2018  . History of non-ST elevation myocardial infarction (NSTEMI) 05/05/2018  . Severe Vitamin D deficiency 04/02/2018  . Community acquired pneumonia of left lower lobe of lung 04/02/2018  . CKD (chronic kidney disease), stage III (HCC) = Secondary to Hypertension 04/02/2018  . Pneumonia 03/30/2018  . Metabolic acidosis 03/30/2018  . AKI (acute kidney injury) (HCC) 03/30/2018  . N&V (nausea and vomiting) 03/30/2018  . Essential hypertension 03/30/2018    Ronn Melena, PT, DPT, NCS 03/24/2021, 8:06 PM  [facility] [address] [place], [place], [address] Phone: [phone]   Fax:  [phone]  Name: RUMEAL CULLIPHER MRN: [id number] Date of Birth: [birth year]

## 2021-03-27 ENCOUNTER — Ambulatory Visit: Payer: Medicaid Other

## 2021-03-27 NOTE — Progress Notes (Signed)
Cardiology Office Note  Date: 03/28/2021   ID: Zachary Mccann, DOB 04/24/1971, MRN [id number]  PCP:  Wilson Singer, MD  Cardiologist:  Dina Rich, MD Electrophysiologist:  None   Chief Complaint: Cardiac follow-up  History of Present Illness: Zachary Mccann is a 50 y.o. male with a history of  CAD / NSTEMI 2019 DES to LCx, HTN, CVA, CKD III, Tobacco use, morbid obesity, HLD.  Last see by Dr Wyline Mood 11/02/2019 : Had NSTEMI 2019 with occluded LCx. DES to LCx. EF 35-45% Echo 2019 EF 50-55% , No WMA's , G1DD. Had been on Brilinta alone due to ASA allergy. Coreg stopped due to fatigue and ED. No ACEI / ARB / ARNI / Aldactone due to decreased renal function. Previous CVA 2019 on Plavix. Echo 08/2018 EF 35-40%. Echo 2020 EF increased to 40-45%. Was compliant with BP meds. Compliant with statin LDL 81. CKD3. Followed by nephrology.  CVA 2019 (left cerebellar CVA). 30 day monitor was arranged.  No arrhythmias detected.  Had a subsequent CVA July 2020 with left MCA and ACA scattered infarct with Left ICA occlusion. HF treatment limited by renal disease. Plavix was re-started. Continuing statin and anti-hypertensive therapy.  He is here for follow-up for history of CAD, HTN.  Blood pressures well controlled at 126/80.  He denies any anginal or exertional symptoms.  He has a history of significant CVA with right-sided hemiparesis and expressive aphasia.  He attempts to communicate verbally but speech is unintelligible for the most part.  He does move his left arm.  He denies any further strokelike symptoms, palpitations or arrhythmias.  Orthostatic symptoms, PND, orthopnea, bleeding.  No claudication-like symptoms, DVT or PE-like symptoms.  He is undergoing rehab at neuro rehab center in [place].  He states he is compliant with all of his medications.  He continues on amlodipine 10 mg daily, atorvastatin 40 mg daily, carvedilol 6.25 mg p.o. twice daily.  Plavix 75 mg daily, hydralazine 25 mg  p.o. 3 times daily.  Recent renal labs showed a creatinine of 1.41 and GFR of 67 on 10/17/2020.  He has actually lost some weight about 20 pounds since last year and April.   Past Medical History:  Diagnosis Date  . CAD (coronary artery disease)    a. s/p NSTEMI in 04/2018 with DES to LCx.   [name] Hypertension   . Myocardial infarction (HCC)   . Pneumonia   . Stroke [facility])     Past Surgical History:  Procedure Laterality Date  . CORONARY STENT INTERVENTION N/A 05/05/2018   Procedure: CORONARY STENT INTERVENTION;  Surgeon: Marykay Lex, MD;  Location: [facility] INVASIVE CV LAB;  Service: Cardiovascular;  Laterality: N/A;  . LEFT HEART CATH AND CORONARY ANGIOGRAPHY N/A 05/05/2018   Procedure: LEFT HEART CATH AND CORONARY ANGIOGRAPHY;  Surgeon: Marykay Lex, MD;  Location: [facility] INVASIVE CV LAB;  Service: Cardiovascular;  Laterality: N/A;  . TEE WITHOUT CARDIOVERSION N/A 09/08/2018   Procedure: TRANSESOPHAGEAL ECHOCARDIOGRAM (TEE);  Surgeon: Laurey Morale, MD;  Location: [facility] ENDOSCOPY;  Service: Cardiovascular;  Laterality: N/A;    Current Outpatient Medications  Medication Sig Dispense Refill  . acetaminophen (TYLENOL) 325 MG tablet Take 2 tablets (650 mg total) by mouth every 6 (six) hours as needed for mild pain (or Fever >/= 101).    [name] albuterol (VENTOLIN HFA) 108 (90 Base) MCG/ACT inhaler Inhale 2 puffs into the lungs every 6 (six) hours as needed for wheezing or shortness of breath. 8 g 2  .  amLODipine (NORVASC) 10 MG tablet Take 1 tablet (10 mg total) by mouth daily. 90 tablet 0  . atorvastatin (LIPITOR) 40 MG tablet TAKE 1 TABLET BY MOUTH ONCE DAILY AT  6  PM 90 tablet 0  . carvedilol (COREG) 6.25 MG tablet Take 1 tablet (6.25 mg total) by mouth 2 (two) times daily with a meal. 60 tablet 3  . Cholecalciferol (VITAMIN D-3) 125 MCG (5000 UT) TABS Take 1 tablet by mouth daily. 30 tablet 3  . ciprofloxacin (CIPRO) 500 MG tablet Take 1 tablet (500 mg total) by mouth 2 (two) times daily. For 7  days 14 tablet 0  . guaiFENesin (MUCINEX) 600 MG 12 hr tablet Take 1 tablet (600 mg total) by mouth 2 (two) times daily. 60 tablet 2  . hydrALAZINE (APRESOLINE) 25 MG tablet TAKE 1 TABLET BY MOUTH THREE TIMES DAILY 90 tablet 3  . Melatonin 10 MG TABS Take 10 mg by mouth at bedtime. 30 tablet 0  . nystatin (MYCOSTATIN/NYSTOP) powder Apply 1 application topically 3 (three) times daily. 15 g 2  . senna-docusate (SENOKOT-S) 8.6-50 MG tablet Take 2 tablets by mouth at bedtime.    . sildenafil (VIAGRA) 100 MG tablet Take 0.5-1 tablets (50-100 mg total) by mouth daily as needed for erectile dysfunction. 5 tablet 3  . clopidogrel (PLAVIX) 75 MG tablet Take 1 tablet (75 mg total) by mouth daily. 90 tablet 3   No current facility-administered medications for this visit.   Allergies:  Aspirin   Social History: The patient  reports that he has quit smoking. His smoking use included cigarettes. He has never used smokeless tobacco. He reports previous alcohol use. He reports previous drug use.   Family History: The patient's family history includes Alcoholism in his father; Cancer in his father and sister; Dementia in his mother; Hypertension in his father and mother; Stroke in his father and mother.   ROS:  Please see the history of present illness. Otherwise, complete review of systems is positive for none.  All other systems are reviewed and negative.   Physical Exam: VS:  BP 126/80   Pulse 64   Ht 6' (1.829 m)   Wt 300 lb (136.1 kg)   SpO2 96%   BMI 40.69 kg/m , BMI Body mass index is 40.69 kg/m.  Wt Readings from Last 3 Encounters:  03/28/21 300 lb (136.1 kg)  09/02/20 277 lb 12.5 oz (126 kg)  04/07/20 (!) 321 lb 12.8 oz (146 kg)    General: Patient appears comfortable at rest. Neck: Supple, no elevated JVP or carotid bruits, no thyromegaly. Lungs: Clear to auscultation, nonlabored breathing at rest. Cardiac: Regular rate and rhythm, no S3 or significant systolic murmur, no pericardial  rub. Extremities: No pitting edema, distal pulses 2+. Skin: Warm and dry. Musculoskeletal: No kyphosis. Neuropsychiatric: Alert and oriented x3, affect grossly appropriate.  ECG:  Last EKG 09/02/2020 sinus tachycardia with a rate of 104.  Right atrial enlargement, nonspecific T wave abnormality.  Recent Labwork: 10/17/2020: ALT 10; AST 10; BUN 19; Creat 1.41; Hemoglobin 12.8; Platelets 306; Potassium 4.2; Sodium 139; TSH 3.24     Component Value Date/Time   CHOL 140 10/17/2020 1405   TRIG 90 10/17/2020 1405   HDL 38 (L) 10/17/2020 1405   CHOLHDL 3.7 10/17/2020 1405   VLDL 23 07/11/2019 0732   LDLCALC 84 10/17/2020 1405    Other Studies Reviewed Today:   04/2018 cath  Mid Cx to Dist Cx lesion is 100% stenosed.  A drug-eluting  stent was successfully placed using a STENT SYNERGY DES 3X24. -Postdilated to 3.3 mm  Post intervention, there is a 0% residual stenosis.  LAV Groove lesion is 65% stenosed - In a small branch beyond the stent.  There is moderate left ventricular systolic dysfunction. The left ventricular ejection fraction is 35-45% by visual estimate.  Severe single-vessel disease with occlusion of the distal Cx treated with single DES stent. Moderately reduced LVEF with moderately elevated LVEDP.   POST CATH PLAN Plan: Admit to CCU. Continue IV nitroglycerin overnight.  With aspirin allergy, have changed from Plavix to Brilinta for antiplatelet agent.  Elevated LVEDP, but CKD-3, will hydrate and give 1 dose of IV Lasix. -Consider diuretic for part of antihypertensive therapy (consider chlorthalidone).  Aggressive blood pressure control, have converted from metoprolol to carvedilol along with amlodipine.-Hold off on ARB/ACE inhibitor until renal function stable post cath  High-dose statin   Check 2D echocardiogram to better assess EF   04/2018 echo Study Conclusions  - Left ventricle: The cavity size was normal. There was moderate concentric  hypertrophy. Systolic function was normal. The estimated ejection fraction was in the range of 50% to 55%. Wall motion was normal; there were no regional wall motion abnormalities. Doppler parameters are consistent with abnormal left ventricular relaxation (grade 1 diastolic dysfunction). - Pericardium, extracardiac: A trivial pericardial effusion was identified.   02/2019 echo IMPRESSIONS   1. Stage 1: Stage: Mid and apical inferior wall, basal and mid inferolateral wall, mid anterolateral segment, and apex are abnormal. 2. The cavity size was mildly dilated. There is mild posterior wall and moderate septal left ventricular hypertrophy. LVEF 40-45%. 3. The aortic root is normal in size and structure. 4. The aortic valve was not well visualized. 5. The right ventricle has normal systolic function. The cavity was normal. There is no increase in right ventricular wall thickness. 6. The mitral valve is grossly normal.   06/2019 echo 1. The left ventricle has a visually estimated ejection fraction of 40%. The cavity size was normal. There is mildly increased left ventricular wall thickness. Left ventricular diastolic Doppler parameters are consistent with impaired relaxation. No  obvious, formed LV thrombus seen with Definity contrast. 2. There is akinesis of the left ventricular, basal-mid inferolateral wall and inferior wall. 3. The right ventricle has normal systolic function. The cavity was normal. There is no increase in right ventricular wall thickness. 4. The mitral valve is grossly normal. 5. The tricuspid valve is grossly normal. 6. The aortic valve was not well visualized. 7. The aorta is normal in size and structure. 8. The interatrial septum was not well visualized.  Assessment and Plan:  1. CAD in native artery   2. Chronic systolic heart failure (HCC)   3. Mixed hyperlipidemia   4. Essential hypertension    1. CAD in native artery Denies  any anginal symptoms., dyspnea, continue to Plavix 75 mg daily.  Continue carvedilol 6.25 mg p.o. twice daily.  2. Chronic systolic heart failure (HCC) Current weight is 300 pounds.  Denies any shortness of breath, weight gain.  No evidence of lower extremity edema.  Continue carvedilol 6.25 mg p.o. twice daily.  Continue hydralazine 25 mg p.o. 3 times daily.  3. Mixed hyperlipidemia Continue atorvastatin 40 mg p.o. daily.  Lipid panel 10/17/2020: TC 140, HDL 38, TG 90, LDL 84.  4. Essential hypertension Blood pressure well controlled on current therapy with BP 126/80.  Continue amlodipine 10 mg daily.  Carvedilol 6.25 mg p.o. twice daily.  Continue hydralazine 25 mg p.o. 3 times daily.  Medication Adjustments/Labs and Tests Ordered: Current medicines are reviewed at length with the patient today.  Concerns regarding medicines are outlined above.   Disposition: Follow-up with Branch or APP 6 months  Signed, Rennis Harding, NP 03/28/2021 3:05 PM    [facility] [address] [place], [place], [place] [address] Phone: [phone]; Fax: [phone]

## 2021-03-28 ENCOUNTER — Ambulatory Visit (INDEPENDENT_AMBULATORY_CARE_PROVIDER_SITE_OTHER): Payer: Medicaid Other | Admitting: Family Medicine

## 2021-03-28 ENCOUNTER — Encounter: Payer: Self-pay | Admitting: Family Medicine

## 2021-03-28 ENCOUNTER — Telehealth: Payer: Self-pay | Admitting: Internal Medicine

## 2021-03-28 VITALS — BP 126/80 | HR 64 | Ht 72.0 in | Wt 300.0 lb

## 2021-03-28 DIAGNOSIS — I2510 Atherosclerotic heart disease of native coronary artery without angina pectoris: Secondary | ICD-10-CM | POA: Diagnosis not present

## 2021-03-28 DIAGNOSIS — E782 Mixed hyperlipidemia: Secondary | ICD-10-CM

## 2021-03-28 DIAGNOSIS — I10 Essential (primary) hypertension: Secondary | ICD-10-CM | POA: Diagnosis not present

## 2021-03-28 DIAGNOSIS — I5022 Chronic systolic (congestive) heart failure: Secondary | ICD-10-CM | POA: Diagnosis not present

## 2021-03-28 MED ORDER — CLOPIDOGREL BISULFATE 75 MG PO TABS: 75.0000 mg | ORAL_TABLET | Freq: Every day | ORAL | 3 refills | Status: DC

## 2021-03-28 NOTE — Telephone Encounter (Signed)
Left message for patient to call me back. Patient called as I was entering this note. Patient is scheduled with the [employer] pharmacist on 04/05/21.

## 2021-03-28 NOTE — Patient Instructions (Signed)
Your physician recommends that you schedule a follow-up appointment in: 6 MONTHS WITH DR BRANCH  Your physician recommends that you continue on your current medications as directed. Please refer to the Current Medication list given to you today.  Thank you for choosing [facility]!!    

## 2021-03-29 ENCOUNTER — Other Ambulatory Visit: Payer: Self-pay

## 2021-03-29 ENCOUNTER — Ambulatory Visit: Payer: Medicaid Other

## 2021-03-29 DIAGNOSIS — R2689 Other abnormalities of gait and mobility: Secondary | ICD-10-CM | POA: Diagnosis not present

## 2021-03-29 DIAGNOSIS — I69353 Hemiplegia and hemiparesis following cerebral infarction affecting right non-dominant side: Secondary | ICD-10-CM | POA: Diagnosis not present

## 2021-03-29 DIAGNOSIS — M6281 Muscle weakness (generalized): Secondary | ICD-10-CM | POA: Diagnosis not present

## 2021-03-29 DIAGNOSIS — R2681 Unsteadiness on feet: Secondary | ICD-10-CM | POA: Diagnosis not present

## 2021-03-29 NOTE — Patient Instructions (Signed)
Access Code: [email or web address] URL: [email or web address] Date: 03/29/2021 Prepared by: Elmer Bales  Exercises Seated Long Arc Quad - 2 x daily - 5 x weekly - 2 sets - 10 reps Seated Heel Slide - 2 x daily - 5 x weekly - 2 sets - 10 reps Standing March with Counter Support - 2 x daily - 5 x weekly - 1 sets - 10 reps Sit to Stand with Armchair - 2 x daily - 5 x weekly - 1 sets - 5 reps

## 2021-03-29 NOTE — Therapy (Signed)
[facility] [address] [place], [place], [address] Phone: [phone]   Fax:  [phone]  Physical Therapy Treatment  Patient Details  Name: Zachary Mccann MRN: [id number] Date of Birth: 1971-05-09 Referring Provider (PT): Ihor Austin, NP   Encounter Date: 03/29/2021   PT End of Session - 03/29/21 1107    Visit Number 3    Number of Visits 17    Date for PT Re-Evaluation 05/19/21    Authorization Type Medicaid 4/8-6/20 16 visits    Authorization - Visit Number 2    Authorization - Number of Visits 16    PT Start Time 1103    PT Stop Time 1145    PT Time Calculation (min) 42 min    Equipment Utilized During Treatment Gait belt    Activity Tolerance Patient tolerated treatment well    Behavior During Therapy WFL for tasks assessed/performed           Past Medical History:  Diagnosis Date  . CAD (coronary artery disease)    a. s/p NSTEMI in 04/2018 with DES to LCx.   [name] Hypertension   . Myocardial infarction (HCC)   . Pneumonia   . Stroke [facility])     Past Surgical History:  Procedure Laterality Date  . CORONARY STENT INTERVENTION N/A 05/05/2018   Procedure: CORONARY STENT INTERVENTION;  Surgeon: Marykay Lex, MD;  Location: [facility] INVASIVE CV LAB;  Service: Cardiovascular;  Laterality: N/A;  . LEFT HEART CATH AND CORONARY ANGIOGRAPHY N/A 05/05/2018   Procedure: LEFT HEART CATH AND CORONARY ANGIOGRAPHY;  Surgeon: Marykay Lex, MD;  Location: [facility] INVASIVE CV LAB;  Service: Cardiovascular;  Laterality: N/A;  . TEE WITHOUT CARDIOVERSION N/A 09/08/2018   Procedure: TRANSESOPHAGEAL ECHOCARDIOGRAM (TEE);  Surgeon: Laurey Morale, MD;  Location: [facility] ENDOSCOPY;  Service: Cardiovascular;  Laterality: N/A;    There were no vitals filed for this visit.   Subjective Assessment - 03/29/21 1107    Subjective Pt did receive the new w/c and he likes it much more. Finds it easier to get up.    Patient is accompained  by: --   Stasia Cavalier - girlfriend   Pertinent History history of hypertension, tobacco abuse, CAD with non-STEMI 04/2018 as well as left ICA occlusion July 2020 with associated CVA causing right side weakness and slurred speech. New watershed 3/21; Covid 9/21 - hospitalized 9-17- -09-04-20    Patient Stated Goals increase strength, be able to stand and walk as he was able to before Covid    Currently in Pain? Yes    Pain Score 4     Pain Location Back    Pain Orientation Left    Pain Descriptors / Indicators Aching    Pain Type Chronic pain    Pain Onset More than a month ago    Pain Frequency Intermittent    Aggravating Factors  certain movements                             [facility] Adult PT Treatment/Exercise - 03/29/21 1109      Transfers   Transfers Sit to Stand;Stand to Sit    Sit to Stand 3: Mod assist;4: Min assist;4: Min guard    Sit to Stand Details Verbal cues for technique;Tactile cues for weight shifting    Sit to Stand Details (indicate cue type and reason) From new w/c pt was cued to push from chair and lean forward  to prevent twisting to left to increase right weight shift. Mod assist from w/c with CGA of 2nd person. From mat min assist/CGA at 21"    Stand to Sit 4: Min assist;4: Min guard    Stand to Sit Details (indicate cue type and reason) Verbal cues for technique    Comments Pt performed sit to stand from elevated mat at 21" today x 5.      Ambulation/Gait   Ambulation/Gait Yes    Ambulation/Gait Assistance 4: Min assist    Ambulation/Gait Assistance Details Verbal cues to shift weight on to right to slow down left step.    Ambulation Distance (Feet) 25 Feet    Assistive device Rolling walker   bariatric with w/c follow   Gait Pattern Step-to pattern;Step-through pattern;Decreased step length - right;Decreased stance time - right;Decreased hip/knee flexion - right    Ambulation Surface Level;Indoor      Neuro Re-ed    Neuro Re-ed Details  Standing at  walker: tapping 2" step with LLE 5 x 2 with PT assisting to block posterior pelvic rotation on right and weight shift more to right. Verbal cues to squeeze bottom. Standing without UE support at walker reaching with LUE x 30 sec CGA.      Exercises   Exercises Other Exercises    Other Exercises  Seated edge of mat: right LAQ x 10 with verbal cues to sit up tall and control lowering, left heel slides x 10. Standing at walker right hip flexion x 10 through partial range.                  PT Education - 03/29/21 1206    Education Details Started on new HEP    Person(s) Educated Patient    Methods Explanation;Demonstration;Handout    Comprehension Verbalized understanding;Returned demonstration            PT Short Term Goals - 03/20/21 2229      PT SHORT TERM GOAL #1   Title Pt will be independent with initial HEP for strengthening and stretching.    Baseline HEP to be established    Time 4    Period Weeks    Status New    Target Date 04/21/21      PT SHORT TERM GOAL #2   Title Pt will perform squat pivot transfers to level surface with min assist.    Baseline mod assist for squat pivot transfers    Time 4    Period Weeks    Status New    Target Date 04/21/20      PT SHORT TERM GOAL #3   Title Pt will be able to perform sit to stand transfer from wheelchair to RW with min assist +1.    Baseline mod assist +2 from mat to RW    Time 4    Period Weeks    Status New    Target Date 04/21/20      PT SHORT TERM GOAL #4   Title Pt will ambulate 69' with RW with +1 mod assist for improved household mobility.    Baseline 20' with RW with mod assist (2nd person present for safety)    Time 4    Period Weeks    Status New    Target Date 04/21/21      PT SHORT TERM GOAL #5   Title Assess need for Rt AFO and obtain orthotic consult if needed.    Baseline Assessment for AFO to be completed  Time 4    Period Weeks    Status New    Target Date 04/21/21              PT Long Term Goals - 03/20/21 2247      PT LONG TERM GOAL #1   Title Pt will perform sit to stand transfers with +1 min assist.    Baseline max assist    Time 8    Period Weeks    Status New    Target Date 05/19/21      PT LONG TERM GOAL #2   Title Pt will amb. with RW 100' with +1 min assist for household mobility.    Baseline 20' with mod assist with RW    Time 8    Period Weeks    Status New    Target Date 05/19/21      PT LONG TERM GOAL #3   Title Pt will be able to ambulate >100' CGA with RW for further improvement in household mobility.    Baseline 20' mod assist with RW    Time 8    Period Weeks    Status New    Target Date 05/19/21      PT LONG TERM GOAL #4   Title Pt will be able to maintain standing x 5 min with supervision with light UE support on walker or counter for improved balance and participation with ADLs.    Baseline Bil. UE support on counter for 1" with min assist    Time 8    Period Weeks    Status New    Target Date 05/19/21      PT LONG TERM GOAL #5   Title Caregiver will assist pt in performing updated HEP to assist with functional strengthening and stretching RLE.    Baseline Dependent    Time 8    Period Weeks    Status New    Target Date 05/19/21                 Plan - 03/29/21 1207    Clinical Impression Statement Pt received new w/c and was able to perform sit to stand with less assist today. Continued to focus on sit to stand training from mat lowering to 21" today. With verbal cues to push with both hands and lean forward pt was able to rise easier with less twisting and better right weight shift. Pt does continue to need frequent seated rest breaks as fatigues. Back felt better at end of session.    Personal Factors and Comorbidities Comorbidity 3+;Fitness;Time since onset of injury/illness/exacerbation    Comorbidities hypertension, tobacco abuse, CAD with non-STEMI 04/2018 as well as left ICA occlusion July 2020     Examination-Activity Limitations Bathing;Hygiene/Grooming;Stairs;Bed Mobility;Locomotion Level;Stand;Toileting;Transfers    Examination-Participation Restrictions Community Activity;Cleaning;Meal Prep;Driving;Shop;Laundry    Stability/Clinical Decision Making Evolving/Moderate complexity    Rehab Potential Good    PT Frequency 2x / week    PT Duration 8 weeks   10 weeks plus eval   PT Treatment/Interventions ADLs/Self Care Home Management;Moist Heat;Electrical Stimulation;Gait training;DME Instruction;Functional mobility training;Stair training;Neuromuscular re-education;Balance training;Therapeutic exercise;Therapeutic activities;Patient/family education;Orthotic Fit/Training;Vestibular;Passive range of motion;Manual techniques;Wheelchair mobility training    PT Next Visit Plan How is initial HEP going? Contine RLE strengthening, standing balance, transfers and gait training with bariatric RW.    Consulted and Agree with Plan of Care Patient;Other (Comment)   girlfriend - [name]          Patient will benefit from skilled  therapeutic intervention in order to improve the following deficits and impairments:  Abnormal gait,Decreased activity tolerance,Decreased balance,Decreased endurance,Decreased mobility,Decreased knowledge of use of DME,Decreased range of motion,Decreased strength,Impaired UE functional use,Impaired tone  Visit Diagnosis: Other abnormalities of gait and mobility  Muscle weakness (generalized)  Hemiplegia and hemiparesis following cerebral infarction affecting right non-dominant side [facility])     Problem List Patient Active Problem List   Diagnosis Date Noted  . CKD (chronic kidney disease) stage 2, GFR 60-89 ml/min 10/18/2020  . COVID-19   . Acute renal failure superimposed on stage 3a chronic kidney disease (HCC)   . Pneumonia due to COVID-19 virus 09/02/2020  . Acute ischemic left middle cerebral artery (MCA) stroke (HCC) 05/11/2020  . Cerebral infarction, watershed  distribution, unilateral, acute (HCC) 05/11/2020  . Dysarthria as late effect of cerebrovascular accident (CVA) 05/11/2020  . Visual field constriction, bilateral 05/11/2020  . Hemiparesis affecting right side as late effect of cerebrovascular accident (HCC) 05/11/2020  . Dysarthria, post-stroke 04/07/2020  . 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  . Hyperglycemia   . Anemia   . Acute pain of right knee   . Primary osteoarthritis of right knee   . Expressive language impairment   . Benign essential HTN   . Acute bilat watershed infarction [facility]) 03/16/2020  . Cerebral infarction, watershed distribution, unilateral, chronic 03/16/2020  . Acute renal failure (HCC)   . History of CVA in adulthood   . Dyslipidemia   . CVA (cerebral vascular accident) (HCC) 03/11/2020  . Leukocytosis   . Hemiparesis affecting dominant side as late effect of stroke (HCC)   . Labile blood pressure   . Sleep disturbance   . Benign hypertensive heart and kidney disease with diastolic CHF, NYHA class II and CKD stage III (HCC)   . Chronic systolic congestive heart failure (HCC)   . Morbid obesity (HCC)   . Uncontrolled hypertension   . Dysphagia, post-stroke   . Cardiomegaly   . Acute systolic congestive heart failure (HCC)   . 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 Cerebral embolism with cerebral infarction 07/11/2019  . Acute CVA (cerebrovascular accident) (HCC) 07/11/2019  . Slurred speech 07/10/2019  . Cardiomyopathy (HCC) 11/20/2018  . History of stroke 09/06/2018  . Tobacco use 09/06/2018  . History of non-ST elevation myocardial infarction (NSTEMI) 05/05/2018  . Severe Vitamin D deficiency 04/02/2018  . Community acquired pneumonia of left lower lobe of lung 04/02/2018  . CKD (chronic kidney disease), stage III (HCC) = Secondary to Hypertension 04/02/2018  . Pneumonia 03/30/2018  . Metabolic acidosis 03/30/2018  . AKI (acute kidney injury) (HCC) 03/30/2018  . N&V (nausea and  vomiting) 03/30/2018  . Essential hypertension 03/30/2018    Ronn Melena, PT, DPT, NCS 03/29/2021, 12:13 PM  [facility] [address] [place], [place], [address] Phone: [phone]   Fax:  [phone]  Name: Zachary Mccann MRN: [id number] Date of Birth: 17-Apr-1971

## 2021-04-03 ENCOUNTER — Ambulatory Visit: Payer: Medicaid Other | Admitting: Physical Therapy

## 2021-04-03 ENCOUNTER — Encounter: Payer: Self-pay | Admitting: Physical Therapy

## 2021-04-03 ENCOUNTER — Other Ambulatory Visit: Payer: Self-pay

## 2021-04-03 DIAGNOSIS — M6281 Muscle weakness (generalized): Secondary | ICD-10-CM

## 2021-04-03 DIAGNOSIS — R2681 Unsteadiness on feet: Secondary | ICD-10-CM | POA: Diagnosis not present

## 2021-04-03 DIAGNOSIS — R2689 Other abnormalities of gait and mobility: Secondary | ICD-10-CM

## 2021-04-03 DIAGNOSIS — I69353 Hemiplegia and hemiparesis following cerebral infarction affecting right non-dominant side: Secondary | ICD-10-CM

## 2021-04-04 NOTE — Therapy (Signed)
[facility] [address] [place], [place], [address] Phone: [phone]   Fax:  [phone]  Physical Therapy Treatment  Patient Details  Name: Zachary Mccann MRN: [id number] Date of Birth: 16-Apr-1971 Referring Provider (PT): Ihor Austin, NP   Encounter Date: 04/03/2021   PT End of Session - 04/04/21 1711    Visit Number 4    Number of Visits 17    Date for PT Re-Evaluation 05/19/21    Authorization Type Medicaid 4/8-6/20 16 visits    Authorization - Visit Number 3    Authorization - Number of Visits 16    PT Start Time 1005   started session 10" early   PT Stop Time 1100    PT Time Calculation (min) 55 min    Equipment Utilized During Treatment Gait belt    Activity Tolerance Patient tolerated treatment well    Behavior During Therapy WFL for tasks assessed/performed           Past Medical History:  Diagnosis Date  . CAD (coronary artery disease)    a. s/p NSTEMI in 04/2018 with DES to LCx.   [name] Hypertension   . Myocardial infarction (HCC)   . Pneumonia   . Stroke [facility])     Past Surgical History:  Procedure Laterality Date  . CORONARY STENT INTERVENTION N/A 05/05/2018   Procedure: CORONARY STENT INTERVENTION;  Surgeon: Marykay Lex, MD;  Location: [facility] INVASIVE CV LAB;  Service: Cardiovascular;  Laterality: N/A;  . LEFT HEART CATH AND CORONARY ANGIOGRAPHY N/A 05/05/2018   Procedure: LEFT HEART CATH AND CORONARY ANGIOGRAPHY;  Surgeon: Marykay Lex, MD;  Location: [facility] INVASIVE CV LAB;  Service: Cardiovascular;  Laterality: N/A;  . TEE WITHOUT CARDIOVERSION N/A 09/08/2018   Procedure: TRANSESOPHAGEAL ECHOCARDIOGRAM (TEE);  Surgeon: Laurey Morale, MD;  Location: [facility] ENDOSCOPY;  Service: Cardiovascular;  Laterality: N/A;    There were no vitals filed for this visit.   Subjective Assessment - 04/03/21 0959    Subjective Pt reports he is standing "a little bit" at home - reports he stands some every  day with assistance    Patient is accompained by: --   [name] - girlfriend   Pertinent History history of hypertension, tobacco abuse, CAD with non-STEMI 04/2018 as well as left ICA occlusion July 2020 with associated CVA causing right side weakness and slurred speech. New watershed 3/21; Covid 9/21 - hospitalized 9-17- -09-04-20    Patient Stated Goals increase strength, be able to stand and walk as he was able to before Covid    Currently in Pain? Yes    Pain Score 5     Pain Location Hip    Pain Orientation Left    Pain Descriptors / Indicators Aching;Sharp    Pain Type Acute pain    Pain Onset Today    Pain Frequency Intermittent    Aggravating Factors  unknown    Pain Relieving Factors unknown                             [facility] Adult PT Treatment/Exercise - 04/04/21 0001      Transfers   Transfers Sit to Stand    Sit to Stand 4: Min assist    Sit to Stand Details Verbal cues for technique;Tactile cues for weight shifting    Stand to Sit 4: Min assist    Stand to Sit Details (indicate cue type and reason) Verbal cues  for technique      Ambulation/Gait   Ambulation/Gait Yes    Ambulation/Gait Assistance 4: Min assist    Ambulation/Gait Assistance Details Blue rocker AFO was trialed on RLE but pt stated it was too uncomfortable - pt took approx. 5 steps and requested AFO to be removed; gait training was completed without AFO    Ambulation Distance (Feet) 45.11 Feet   54' 2nd rep   Assistive device Rolling walker   bariatric RW with wheelchair following   Gait Pattern Step-to pattern;Step-through pattern;Decreased step length - right;Decreased stance time - right;Decreased hip/knee flexion - right    Ambulation Surface Level;Indoor      Exercises   Exercises Knee/Hip      Knee/Hip Exercises: Aerobic   Recumbent Bike SciFit level 2.5 x 5" from wheelchair - with UE's and LE's      Knee/Hip Exercises: Supine   Heel Slides AROM;Right;1 set;10 reps    Bridges  AROM;1 set;10 reps    Straight Leg Raises AROM;Right;1 set;10 reps    Other Supine Knee/Hip Exercises Rt hip abduction/adduction in hooklying position 10 reps with manual resistance each direction 10 reps                    PT Short Term Goals - 04/04/21 1717      PT SHORT TERM GOAL #1   Title Pt will be independent with initial HEP for strengthening and stretching.    Baseline HEP to be established    Time 4    Period Weeks    Status New    Target Date 04/21/21      PT SHORT TERM GOAL #2   Title Pt will perform squat pivot transfers to level surface with min assist.    Baseline mod assist for squat pivot transfers    Time 4    Period Weeks    Status New    Target Date 04/21/20      PT SHORT TERM GOAL #3   Title Pt will be able to perform sit to stand transfer from wheelchair to RW with min assist +1.    Baseline mod assist +2 from mat to RW    Time 4    Period Weeks    Status New    Target Date 04/21/20      PT SHORT TERM GOAL #4   Title Pt will ambulate 41' with RW with +1 mod assist for improved household mobility.    Baseline 20' with RW with mod assist (2nd person present for safety)    Time 4    Period Weeks    Status New    Target Date 04/21/21      PT SHORT TERM GOAL #5   Title Assess need for Rt AFO and obtain orthotic consult if needed.    Baseline Assessment for AFO to be completed    Time 4    Period Weeks    Status New    Target Date 04/21/21             PT Long Term Goals - 04/04/21 1717      PT LONG TERM GOAL #1   Title Pt will perform sit to stand transfers with +1 min assist.    Baseline max assist    Time 8    Period Weeks    Status New      PT LONG TERM GOAL #2   Title Pt will amb. with RW 100' with +1 min assist  for household mobility.    Baseline 20' with mod assist with RW    Time 8    Period Weeks    Status New      PT LONG TERM GOAL #3   Title Pt will be able to ambulate >100' CGA with RW for further improvement  in household mobility.    Baseline 20' mod assist with RW    Time 8    Period Weeks    Status New      PT LONG TERM GOAL #4   Title Pt will be able to maintain standing x 5 min with supervision with light UE support on walker or counter for improved balance and participation with ADLs.    Baseline Bil. UE support on counter for 1" with min assist    Time 8    Period Weeks    Status New      PT LONG TERM GOAL #5   Title Caregiver will assist pt in performing updated HEP to assist with functional strengthening and stretching RLE.    Baseline Dependent    Time 8    Period Weeks    Status New                 Plan - 04/04/21 1714    Clinical Impression Statement Pt demonstrating improvement in gait and activity tolerance as amb. distance increased from 25' to 72' and 54' in today's session.  Pt unable to tolerate Blue rocker AFO on RLE, stating it was too uncomfortable.  Pt is progressing well towards goals.    Personal Factors and Comorbidities Comorbidity 3+;Fitness;Time since onset of injury/illness/exacerbation    Comorbidities hypertension, tobacco abuse, CAD with non-STEMI 04/2018 as well as left ICA occlusion July 2020    Examination-Activity Limitations Bathing;Hygiene/Grooming;Stairs;Bed Mobility;Locomotion Level;Stand;Toileting;Transfers    Examination-Participation Restrictions Community Activity;Cleaning;Meal Prep;Driving;Shop;Laundry    Stability/Clinical Decision Making Evolving/Moderate complexity    Rehab Potential Good    PT Frequency 2x / week    PT Duration 8 weeks   10 weeks plus eval   PT Treatment/Interventions ADLs/Self Care Home Management;Moist Heat;Electrical Stimulation;Gait training;DME Instruction;Functional mobility training;Stair training;Neuromuscular re-education;Balance training;Therapeutic exercise;Therapeutic activities;Patient/family education;Orthotic Fit/Training;Vestibular;Passive range of motion;Manual techniques;Wheelchair mobility training     PT Next Visit Plan [place] RLE strengthening, standing balance, transfers and gait training with bariatric RW.    Consulted and Agree with Plan of Care Patient;Other (Comment)   girlfriend - [name]          Patient will benefit from skilled therapeutic intervention in order to improve the following deficits and impairments:  Abnormal gait,Decreased activity tolerance,Decreased balance,Decreased endurance,Decreased mobility,Decreased knowledge of use of DME,Decreased range of motion,Decreased strength,Impaired UE functional use,Impaired tone  Visit Diagnosis: Other abnormalities of gait and mobility  Muscle weakness (generalized)  Hemiplegia and hemiparesis following cerebral infarction affecting right non-dominant side [facility])     Problem List Patient Active Problem List   Diagnosis Date Noted  . CKD (chronic kidney disease) stage 2, GFR 60-89 ml/min 10/18/2020  . COVID-19   . Acute renal failure superimposed on stage 3a chronic kidney disease (HCC)   . Pneumonia due to COVID-19 virus 09/02/2020  . Acute ischemic left middle cerebral artery (MCA) stroke (HCC) 05/11/2020  . Cerebral infarction, watershed distribution, unilateral, acute (HCC) 05/11/2020  . Dysarthria as late effect of cerebrovascular accident (CVA) 05/11/2020  . Visual field constriction, bilateral 05/11/2020  . Hemiparesis affecting right side as late effect of cerebrovascular accident (HCC) 05/11/2020  . Dysarthria, post-stroke 04/07/2020  .  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  . Hyperglycemia   . Anemia   . Acute pain of right knee   . Primary osteoarthritis of right knee   . Expressive language impairment   . Benign essential HTN   . Acute bilat watershed infarction [facility]) 03/16/2020  . Cerebral infarction, watershed distribution, unilateral, chronic 03/16/2020  . Acute renal failure (HCC)   . History of CVA in adulthood   . Dyslipidemia   . CVA (cerebral vascular accident) (HCC) 03/11/2020  .  Leukocytosis   . Hemiparesis affecting dominant side as late effect of stroke (HCC)   . Labile blood pressure   . Sleep disturbance   . Benign hypertensive heart and kidney disease with diastolic CHF, NYHA class II and CKD stage III (HCC)   . Chronic systolic congestive heart failure (HCC)   . Morbid obesity (HCC)   . Uncontrolled hypertension   . Dysphagia, post-stroke   . Cardiomegaly   . Acute systolic congestive heart failure (HCC)   . 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 Cerebral embolism with cerebral infarction 07/11/2019  . Acute CVA (cerebrovascular accident) (HCC) 07/11/2019  . Slurred speech 07/10/2019  . Cardiomyopathy (HCC) 11/20/2018  . History of stroke 09/06/2018  . Tobacco use 09/06/2018  . History of non-ST elevation myocardial infarction (NSTEMI) 05/05/2018  . Severe Vitamin D deficiency 04/02/2018  . Community acquired pneumonia of left lower lobe of lung 04/02/2018  . CKD (chronic kidney disease), stage III (HCC) = Secondary to Hypertension 04/02/2018  . Pneumonia 03/30/2018  . Metabolic acidosis 03/30/2018  . AKI (acute kidney injury) (HCC) 03/30/2018  . N&V (nausea and vomiting) 03/30/2018  . Essential hypertension 03/30/2018    Kary Kos, PT 04/04/2021, 5:19 PM  [facility] [address] [place], [place], [address] Phone: [phone]   Fax:  [phone]  Name: Zachary Mccann MRN: [id number] Date of Birth: [birth year]

## 2021-04-05 ENCOUNTER — Other Ambulatory Visit: Payer: Self-pay

## 2021-04-05 ENCOUNTER — Ambulatory Visit: Payer: Medicaid Other

## 2021-04-05 ENCOUNTER — Other Ambulatory Visit (INDEPENDENT_AMBULATORY_CARE_PROVIDER_SITE_OTHER): Payer: Self-pay | Admitting: Internal Medicine

## 2021-04-05 DIAGNOSIS — G479 Sleep disorder, unspecified: Secondary | ICD-10-CM

## 2021-04-05 DIAGNOSIS — I63512 Cerebral infarction due to unspecified occlusion or stenosis of left middle cerebral artery: Secondary | ICD-10-CM

## 2021-04-05 DIAGNOSIS — K5900 Constipation, unspecified: Secondary | ICD-10-CM

## 2021-04-05 DIAGNOSIS — I10 Essential (primary) hypertension: Secondary | ICD-10-CM

## 2021-04-05 MED ORDER — HYDRALAZINE HCL 25 MG PO TABS: 25.0000 mg | ORAL_TABLET | Freq: Three times a day (TID) | ORAL | 1 refills | Status: AC

## 2021-04-05 MED ORDER — CLOPIDOGREL BISULFATE 75 MG PO TABS: 75.0000 mg | ORAL_TABLET | Freq: Every day | ORAL | 1 refills | Status: AC

## 2021-04-05 MED ORDER — VITAMIN D-3 125 MCG (5000 UT) PO TABS: 1.0000 | ORAL_TABLET | Freq: Every day | ORAL | 1 refills | Status: AC

## 2021-04-05 MED ORDER — CARVEDILOL 6.25 MG PO TABS: 6.2500 mg | ORAL_TABLET | Freq: Two times a day (BID) | ORAL | 1 refills | Status: AC

## 2021-04-05 MED ORDER — AMLODIPINE BESYLATE 10 MG PO TABS: 10.0000 mg | ORAL_TABLET | Freq: Every day | ORAL | 1 refills | Status: AC

## 2021-04-05 MED ORDER — ATORVASTATIN CALCIUM 40 MG PO TABS: ORAL_TABLET | ORAL | 1 refills | Status: AC

## 2021-04-05 NOTE — Patient Outreach (Signed)
Medicaid Managed Care    Pharmacy Note  04/05/2021 Name: Zachary Mccann MRN: [id number] DOB: 1971-05-06  Zachary Mccann is a 50 y.o. year old male who is a primary care patient of Wilson Singer, MD. The Medicaid Managed Care Coordination team was consulted for assistance with disease management and care coordination needs.    Engaged with patient Engaged with patient by telephone for initial visit in response to referral for case management and/or care coordination services.  Mr. Arcidiacono was given information about Managed Medicaid Care Coordination team services today. Zachary Mccann agreed to services and verbal consent obtained.   Objective:  Lab Results  Component Value Date   CREATININE 1.41 (H) 10/17/2020   CREATININE 1.84 (H) 09/04/2020   CREATININE 2.07 (H) 09/03/2020    Lab Results  Component Value Date   HGBA1C 6.2 (H) 10/17/2020       Component Value Date/Time   CHOL 140 10/17/2020 1405   TRIG 90 10/17/2020 1405   HDL 38 (L) 10/17/2020 1405   CHOLHDL 3.7 10/17/2020 1405   VLDL 23 07/11/2019 0732   LDLCALC 84 10/17/2020 1405    Other: (TSH, CBC, Vit D, etc.)  Clinical ASCVD: Yes  The ASCVD Risk score Denman George DC Jr., et al., 2013) failed to calculate for the following reasons:   The patient has a prior MI or stroke diagnosis    Other: (CHADS2VASc if Afib, PHQ9 if depression, MMRC or CAT for COPD, ACT, DEXA)  BP Readings from Last 3 Encounters:  03/28/21 126/80  01/11/21 (!) 137/91  10/27/20 124/76    Assessment/Interventions: Review of patient past medical history, allergies, medications, health status, including review of consultants reports, laboratory and other test data, was performed as part of comprehensive evaluation and provision of chronic care management services.   Lipids: Atorvastatin 40mg  Plan: At goal,  patient stable/ symptoms controlled  Stroke Hydroxyzine 25mg  TID Carvedilol 6.25 BID Amlodipine 10mg  Clopidogrel 75mg  Plan:  At goal,  patient stable/ symptoms controlled  Meds: Patient and family are having difficulty getting meds and issues with compliance. Plan: Verbal consent obtained for [facility] enhanced pharmacy services (medication synchronization, adherence packaging, delivery coordination). A medication sync plan was created to allow patient to get all medications delivered once every 30 to 90 days per patient preference. Patient understands they have freedom to choose pharmacy and clinical pharmacist will coordinate care between all prescribers and [facility].    SDOH (Social Determinants of Health) assessments and interventions performed:    Care Plan  Allergies  Allergen Reactions  . Aspirin Swelling    Medications Reviewed Today    Reviewed by , PT (Physical Therapist) on 04/03/21 at [phone]  Med List Status: <None>  Medication Order Taking? Sig Documenting Provider Last Dose Status Informant  acetaminophen (TYLENOL) 325 MG tablet No Take 2 tablets (650 mg total) by mouth every 6 (six) hours as needed for mild pain (or Fever >/= 101). Kary Kos, PA-C Taking Active Spouse/Significant Other           Med Note 04/05/21, AMBER C   Fri Sep 02, 2020  4:51 PM)    albuterol (VENTOLIN HFA) 108 (90 Base) MCG/ACT inhaler Charlton Amor No Inhale 2 puffs into the lungs every 6 (six) hours as needed for wheezing or shortness of breath. Iran Ouch, MD Taking Active            Med Note Sun, MELANIE A   Thu Mar 23, 2021  9:16  AM) Patient was unaware  amLODipine (NORVASC) 10 MG tablet [id number] No Take 1 tablet (10 mg total) by mouth daily. [name], NP Taking Active   atorvastatin (LIPITOR) 40 MG tablet [id number] No TAKE 1 TABLET BY MOUTH ONCE DAILY AT  6  PM Gosrani, Nimish C, MD Taking Active   carvedilol (COREG) 6.25 MG tablet [id number] No Take 1 tablet (6.25 mg total) by mouth 2 (two) times daily with a meal. Gosrani, Nimish C, MD Taking Active    Cholecalciferol (VITAMIN D-3) 125 MCG (5000 UT) TABS [id number] No Take 1 tablet by mouth daily. [name], NP Taking Active   ciprofloxacin (CIPRO) 500 MG tablet [id number] No Take 1 tablet (500 mg total) by mouth 2 (two) times daily. For 7 days [name], NP Taking Active            Med Note (ROBB, MELANIE A   Thu Mar 23, 2021  9:19 AM) completed  clopidogrel (PLAVIX) 75 MG tablet [id number]  Take 1 tablet (75 mg total) by mouth daily. Netta Neat., NP  Active   guaiFENesin (MUCINEX) 600 MG 12 hr tablet [id number] No Take 1 tablet (600 mg total) by mouth 2 (two) times daily. Erick Blinks, MD Taking Active   hydrALAZINE (APRESOLINE) 25 MG tablet [id number] No TAKE 1 TABLET BY MOUTH THREE TIMES DAILY Gosrani, Nimish C, MD Taking Active   Melatonin 10 MG TABS [id number] No Take 10 mg by mouth at bedtime. Charlton Amor, PA-C Taking Active Spouse/Significant Other           Med Note Iran Ouch, AMBER C   Fri Sep 02, 2020  4:51 PM)    nystatin (MYCOSTATIN/NYSTOP) powder [id number] No Apply 1 application topically 3 (three) times daily. [name], NP Taking Active   senna-docusate (SENOKOT-S) 8.6-50 MG tablet [id number] No Take 2 tablets by mouth at bedtime. [name], NP Taking Active            Med Note Iran Ouch, AMBER C   Fri Sep 02, 2020  4:52 PM)    sildenafil (VIAGRA) 100 MG tablet [id number] No Take 0.5-1 tablets (50-100 mg total) by mouth daily as needed for erectile dysfunction. [name], NP Taking Active           Patient Active Problem List   Diagnosis Date Noted  . CKD (chronic kidney disease) stage 2, GFR 60-89 ml/min 10/18/2020  . COVID-19   . Acute renal failure superimposed on stage 3a chronic kidney disease (HCC)   . Pneumonia due to COVID-19 virus 09/02/2020  . Acute ischemic left middle cerebral artery (MCA) stroke (HCC) 05/11/2020  . Cerebral infarction, watershed distribution, unilateral, acute (HCC) 05/11/2020  . Dysarthria as late effect of  cerebrovascular accident (CVA) 05/11/2020  . Visual field constriction, bilateral 05/11/2020  . Hemiparesis affecting right side as late effect of cerebrovascular accident (HCC) 05/11/2020  . Dysarthria, post-stroke 04/07/2020  . 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  . Hyperglycemia   . Anemia   . Acute pain of right knee   . Primary osteoarthritis of right knee   . Expressive language impairment   . Benign essential HTN   . Acute bilat watershed infarction [facility]) 03/16/2020  . Cerebral infarction, watershed distribution, unilateral, chronic 03/16/2020  . Acute renal failure (HCC)   . History of CVA in adulthood   . Dyslipidemia   . CVA (cerebral vascular accident) (HCC) 03/11/2020  . Leukocytosis   . Hemiparesis affecting dominant side as  late effect of stroke (HCC)   . Labile blood pressure   . Sleep disturbance   . Benign hypertensive heart and kidney disease with diastolic CHF, NYHA class II and CKD stage III (HCC)   . Chronic systolic congestive heart failure (HCC)   . Morbid obesity (HCC)   . Uncontrolled hypertension   . Dysphagia, post-stroke   . Cardiomegaly   . Acute systolic congestive heart failure (HCC)   . 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 Cerebral embolism with cerebral infarction 07/11/2019  . Acute CVA (cerebrovascular accident) (HCC) 07/11/2019  . Slurred speech 07/10/2019  . Cardiomyopathy (HCC) 11/20/2018  . History of stroke 09/06/2018  . Tobacco use 09/06/2018  . History of non-ST elevation myocardial infarction (NSTEMI) 05/05/2018  . Severe Vitamin D deficiency 04/02/2018  . Community acquired pneumonia of left lower lobe of lung 04/02/2018  . CKD (chronic kidney disease), stage III (HCC) = Secondary to Hypertension 04/02/2018  . Pneumonia 03/30/2018  . Metabolic acidosis 03/30/2018  . AKI (acute kidney injury) (HCC) 03/30/2018  . N&V (nausea and vomiting) 03/30/2018  . Essential hypertension 03/30/2018    Conditions to be  addressed/monitored: HTN and Hypertriglyceridemia  Care Plan : Medication Management  Updates made by Zettie Pho, RPH since 04/05/2021 12:00 AM    Problem: Health Promotion or Disease Self-Management (General Plan of Care)     Goal: Medication Management   Note:   Current Barriers:  . Unable to self administer medications as prescribed . Does not maintain contact with provider office . Does not contact provider office for questions/concerns .   Pharmacist Clinical Goal(s):  [name] Over the next 30 days, patient will achieve adherence to monitoring guidelines and medication adherence to achieve therapeutic efficacy . contact provider office for questions/concerns as evidenced notation of same in electronic health record through collaboration with PharmD and provider.  .   Interventions: . Inter-disciplinary care team collaboration (see longitudinal plan of care) . Comprehensive medication review performed; medication list updated in electronic medical record  @RXCPHYPERTENSION @ @RXCPHYPERLIPIDEMIA @  Patient Goals/Self-Care Activities . Over the next 30 days, patient will:  - take medications as prescribed collaborate with provider on medication access solutions  Follow Up Plan: The care management team will reach out to the patient again over the next 30 days.     Task: Mutually Develop and Achievement of Patient Goals   Note:   Care Management Activities:    - verbalization of feelings encouraged    Notes:      Medication Assistance: None required. Patient affirms current coverage meets needs.   Follow up: Agree   Plan: The care management team will reach out to the patient again over the next 30 days.   , Pharm.D., Managed Medicaid Pharmacist - [phone]

## 2021-04-05 NOTE — Patient Instructions (Addendum)
Visit Information  Mr. Wandell was given information about Medicaid Managed Care team care coordination services as a part of their [facility] Medicaid benefit. COMPTON BRIGANCE verbally consented to engagement with the [facility] Managed Care team.   For questions related to your [facility] health plan, please call: [phone]  If you would like to schedule transportation through your [facility] plan, please call the following number at least 2 days in advance of your appointment: [phone]   Call the [facility] Crisis Line at [phone], at any time, 24 hours a day, 7 days a week. If you are in danger or need immediate medical attention call 911.  Mr. Burmester - following are the goals we discussed in your visit today:  Goals Addressed            This Visit's Progress   . Manage My Medicine         Please see education materials related to HTn provided as print materials.   Patient verbalizes understanding of instructions provided today.   The Managed Medicaid care management team will reach out to the patient again over the next 30 days.   Artelia Laroche, Pharm.D., Managed Medicaid Pharmacist [phone]   Following is a copy of your plan of care:  Patient Care Plan: Stroke (Adult)    Problem Identified: Recurrence (Stroke)     Long-Range Goal: Stroke Recurrence Prevented or Minimized   Start Date: 03/23/2021  Expected End Date: 06/22/2021  This Visit's Progress: On track  Priority: High  Note:   Current Barriers:   Ineffective Self Health Maintenance-Mr. Messing has a history of 2 strokes (2019 and 2021). He is managing his health with assistance of [name], his significant other. He recently restarted PT and has been referred for a sleep study. His next PCP appointment is 04/12/21. He has a wheelchair that is in need of repair(missing leg rest, screws and the seat is falling apart). He is trying to improve his health and avoid another  stroke.  Unable to perform ADLs independently  Unable to perform IADLs independently  Currently UNABLE TO independently self manage needs related to chronic health conditions.   Knowledge Deficits related to short term plan for care coordination needs and long term plans for chronic disease management needs Nurse Case Manager Clinical Goal(s):   patient will work with care management team to address care coordination and chronic disease management needs related to Disease Management  Care Coordination   Interventions:   Evaluation of current treatment plan related to stroke history and patient's adherence to plan as established by provider.  Provided education to patient re: heart healthy diet  Reviewed medications with patient and discussed the benefits of consult with [facility] pharmacist  Collaborated with DME supplier ([employer] [phone]) regarding wheelchair needs-Spoke with Star at [employer], an order was placed for a technician to evaluate the wheelchair for repair vs replacement. They will call on 03/24/21 to arrange evaluation  Discussed plans with patient for ongoing care management follow up and provided patient with direct contact information for care management team  Reviewed scheduled/upcoming provider appointments including: upcoming PT appointments, Cardiac 4/12, and PCP on 4/27  Pharmacy referral for medication management  Provided patient with the contact information for medical transportation provided by [facility] [phone] Self Care Activities:  . Patient will self administer medications as prescribed . Patient will attend all scheduled provider appointments . Patient will call pharmacy for medication refills . Patient will call provider office for new concerns or questions  Patient Goals: -- attend 90 percent of physical therapy appointments - eat healthy to increase strength - increase activity or exercise time a little every week  - call to cancel if needed -  keep a calendar with prescription refill dates - keep a calendar with appointment dates  - call [phone], 2-3 days before appointment for medical transportation provided by [facility] - follow up with sleep study referral Follow Up Plan: Telephone follow up appointment with care management team member scheduled for:05/02/21 @ 9am     Patient Care Plan: Medication Management    Problem Identified: Health Promotion or Disease Self-Management (General Plan of Care)     Goal: Medication Management   Note:   Current Barriers:  . Unable to self administer medications as prescribed . Does not maintain contact with provider office . Does not contact provider office for questions/concerns .   Pharmacist Clinical Goal(s):  [name] Over the next 30 days, patient will achieve adherence to monitoring guidelines and medication adherence to achieve therapeutic efficacy . contact provider office for questions/concerns as evidenced notation of same in electronic health record through collaboration with PharmD and provider.  .   Interventions: . Inter-disciplinary care team collaboration (see longitudinal plan of care) . Comprehensive medication review performed; medication list updated in electronic medical record  @RXCPHYPERTENSION @ @RXCPHYPERLIPIDEMIA @  Patient Goals/Self-Care Activities . Over the next 30 days, patient will:  - take medications as prescribed collaborate with provider on medication access solutions  Follow Up Plan: The care management team will reach out to the patient again over the next 30 days.     Task: Mutually Develop and Achievement of Patient Goals   Note:   Care Management Activities:    - verbalization of feelings encouraged    Notes:      Hypertension, Adult High blood pressure (hypertension) is when the force of blood pumping through the arteries is too strong. The arteries are the blood vessels that carry blood from the heart throughout the body.  Hypertension forces the heart to work harder to pump blood and may cause arteries to become narrow or stiff. Untreated or uncontrolled hypertension can cause a heart attack, heart failure, a stroke, kidney disease, and other problems. A blood pressure reading consists of a higher number over a lower number. Ideally, your blood pressure should be below 120/80. The first ("top") number is called the systolic pressure. It is a measure of the pressure in your arteries as your heart beats. The second ("bottom") number is called the diastolic pressure. It is a measure of the pressure in your arteries as the heart relaxes. What are the causes? The exact cause of this condition is not known. There are some conditions that result in or are related to high blood pressure. What increases the risk? Some risk factors for high blood pressure are under your control. The following factors may make you more likely to develop this condition:  Smoking.  Having type 2 diabetes mellitus, high cholesterol, or both.  Not getting enough exercise or physical activity.  Being overweight.  Having too much fat, sugar, calories, or salt (sodium) in your diet.  Drinking too much alcohol. Some risk factors for high blood pressure may be difficult or impossible to change. Some of these factors include:  Having chronic kidney disease.  Having a family history of high blood pressure.  Age. Risk increases with age.  Race. You may be at higher risk if you are African American.  Gender. Men are at  higher risk than women before age 46. After age 95, women are at higher risk than men.  Having obstructive sleep apnea.  Stress. What are the signs or symptoms? High blood pressure may not cause symptoms. Very high blood pressure (hypertensive crisis) may cause:  Headache.  Anxiety.  Shortness of breath.  Nosebleed.  Nausea and vomiting.  Vision changes.  Severe chest pain.  Seizures. How is this  diagnosed? This condition is diagnosed by measuring your blood pressure while you are seated, with your arm resting on a flat surface, your legs uncrossed, and your feet flat on the floor. The cuff of the blood pressure monitor will be placed directly against the skin of your upper arm at the level of your heart. It should be measured at least twice using the same arm. Certain conditions can cause a difference in blood pressure between your right and left arms. Certain factors can cause blood pressure readings to be lower or higher than normal for a short period of time:  When your blood pressure is higher when you are in a health care provider's office than when you are at home, this is called white coat hypertension. Most people with this condition do not need medicines.  When your blood pressure is higher at home than when you are in a health care provider's office, this is called masked hypertension. Most people with this condition may need medicines to control blood pressure. If you have a high blood pressure reading during one visit or you have normal blood pressure with other risk factors, you may be asked to:  Return on a different day to have your blood pressure checked again.  Monitor your blood pressure at home for 1 week or longer. If you are diagnosed with hypertension, you may have other blood or imaging tests to help your health care provider understand your overall risk for other conditions. How is this treated? This condition is treated by making healthy lifestyle changes, such as eating healthy foods, exercising more, and reducing your alcohol intake. Your health care provider may prescribe medicine if lifestyle changes are not enough to get your blood pressure under control, and if:  Your systolic blood pressure is above 130.  Your diastolic blood pressure is above 80. Your personal target blood pressure may vary depending on your medical conditions, your age, and other  factors. Follow these instructions at home: Eating and drinking  Eat a diet that is high in fiber and potassium, and low in sodium, added sugar, and fat. An example eating plan is called the DASH (Dietary Approaches to Stop Hypertension) diet. To eat this way: ? Eat plenty of fresh fruits and vegetables. Try to fill one half of your plate at each meal with fruits and vegetables. ? Eat whole grains, such as whole-wheat pasta, brown rice, or whole-grain bread. Fill about one fourth of your plate with whole grains. ? Eat or drink low-fat dairy products, such as skim milk or low-fat yogurt. ? Avoid fatty cuts of meat, processed or cured meats, and poultry with skin. Fill about one fourth of your plate with lean proteins, such as fish, chicken without skin, beans, eggs, or tofu. ? Avoid pre-made and processed foods. These tend to be higher in sodium, added sugar, and fat.  Reduce your daily sodium intake. Most people with hypertension should eat less than 1,500 mg of sodium a day.  Do not drink alcohol if: ? Your health care provider tells you not to drink. ? You  are pregnant, may be pregnant, or are planning to become pregnant.  If you drink alcohol: ? Limit how much you use to:  0-1 drink a day for women.  0-2 drinks a day for men. ? Be aware of how much alcohol is in your drink. In the [place] one drink equals one 12 oz bottle of beer (355 mL), one 5 oz glass of wine (148 mL), or one 1 oz glass of hard liquor (44 mL).   Lifestyle  Work with your health care provider to maintain a healthy body weight or to lose weight. Ask what an ideal weight is for you.  Get at least 30 minutes of exercise most days of the week. Activities may include walking, swimming, or biking.  Include exercise to strengthen your muscles (resistance exercise), such as Pilates or lifting weights, as part of your weekly exercise routine. Try to do these types of exercises for 30 minutes at least 3 days a week.  Do not  use any products that contain nicotine or tobacco, such as cigarettes, e-cigarettes, and chewing tobacco. If you need help quitting, ask your health care provider.  Monitor your blood pressure at home as told by your health care provider.  Keep all follow-up visits as told by your health care provider. This is important.   Medicines  Take over-the-counter and prescription medicines only as told by your health care provider. Follow directions carefully. Blood pressure medicines must be taken as prescribed.  Do not skip doses of blood pressure medicine. Doing this puts you at risk for problems and can make the medicine less effective.  Ask your health care provider about side effects or reactions to medicines that you should watch for. Contact a health care provider if you:  Think you are having a reaction to a medicine you are taking.  Have headaches that keep coming back (recurring).  Feel dizzy.  Have swelling in your ankles.  Have trouble with your vision. Get help right away if you:  Develop a severe headache or confusion.  Have unusual weakness or numbness.  Feel faint.  Have severe pain in your chest or abdomen.  Vomit repeatedly.  Have trouble breathing. Summary  Hypertension is when the force of blood pumping through your arteries is too strong. If this condition is not controlled, it may put you at risk for serious complications.  Your personal target blood pressure may vary depending on your medical conditions, your age, and other factors. For most people, a normal blood pressure is less than 120/80.  Hypertension is treated with lifestyle changes, medicines, or a combination of both. Lifestyle changes include losing weight, eating a healthy, low-sodium diet, exercising more, and limiting alcohol. This information is not intended to replace advice given to you by your health care provider. Make sure you discuss any questions you have with your health care  provider. Document Revised: 08/13/2018 Document Reviewed: 08/13/2018 Elsevier Patient Education  2021 [employer].

## 2021-04-10 ENCOUNTER — Ambulatory Visit: Payer: Medicaid Other | Admitting: Physical Therapy

## 2021-04-10 ENCOUNTER — Other Ambulatory Visit: Payer: Self-pay

## 2021-04-10 DIAGNOSIS — R2681 Unsteadiness on feet: Secondary | ICD-10-CM

## 2021-04-10 DIAGNOSIS — I69353 Hemiplegia and hemiparesis following cerebral infarction affecting right non-dominant side: Secondary | ICD-10-CM

## 2021-04-10 DIAGNOSIS — R2689 Other abnormalities of gait and mobility: Secondary | ICD-10-CM | POA: Diagnosis not present

## 2021-04-10 DIAGNOSIS — M6281 Muscle weakness (generalized): Secondary | ICD-10-CM | POA: Diagnosis not present

## 2021-04-10 NOTE — Therapy (Signed)
[facility] [address] [place], [place], [address] Phone: [phone]   Fax:  [phone]  Physical Therapy Treatment  Patient Details  Name: Zachary Mccann MRN: [id number] Date of Birth: July 11, 1971 Referring Provider (PT): Ihor Austin, NP   Encounter Date: 04/10/2021   PT End of Session - 04/10/21 1826    Visit Number 5    Number of Visits 17    Date for PT Re-Evaluation 05/19/21    Authorization Type Medicaid 4/8-6/20 16 visits    Authorization - Visit Number 4    Authorization - Number of Visits 16    PT Start Time 1020    PT Stop Time 1100    PT Time Calculation (min) 40 min    Equipment Utilized During Treatment Gait belt    Activity Tolerance Patient tolerated treatment well    Behavior During Therapy [facility] for tasks assessed/performed           Past Medical History:  Diagnosis Date  . CAD (coronary artery disease)    a. s/p NSTEMI in 04/2018 with DES to LCx.   [name] Hypertension   . Myocardial infarction (HCC)   . Pneumonia   . Stroke [facility])     Past Surgical History:  Procedure Laterality Date  . CORONARY STENT INTERVENTION N/A 05/05/2018   Procedure: CORONARY STENT INTERVENTION;  Surgeon: Marykay Lex, MD;  Location: [facility] INVASIVE CV LAB;  Service: Cardiovascular;  Laterality: N/A;  . LEFT HEART CATH AND CORONARY ANGIOGRAPHY N/A 05/05/2018   Procedure: LEFT HEART CATH AND CORONARY ANGIOGRAPHY;  Surgeon: Marykay Lex, MD;  Location: [facility] INVASIVE CV LAB;  Service: Cardiovascular;  Laterality: N/A;  . TEE WITHOUT CARDIOVERSION N/A 09/08/2018   Procedure: TRANSESOPHAGEAL ECHOCARDIOGRAM (TEE);  Surgeon: Laurey Morale, MD;  Location: [facility] ENDOSCOPY;  Service: Cardiovascular;  Laterality: N/A;    There were no vitals filed for this visit.   Subjective Assessment - 04/10/21 1818    Subjective Pt reports he is standing some at home, and also walking a little with assistance    Patient is accompained  by: --   [name] - girlfriend   Pertinent History history of hypertension, tobacco abuse, CAD with non-STEMI 04/2018 as well as left ICA occlusion July 2020 with associated CVA causing right side weakness and slurred speech. New watershed 3/21; Covid 9/21 - hospitalized 9-17- -09-04-20    Patient Stated Goals increase strength, be able to stand and walk as he was able to before Covid    Currently in Pain? Yes    Pain Score 5     Pain Location Hip    Pain Orientation Left    Pain Descriptors / Indicators Aching    Pain Type Acute pain    Pain Onset 1 to 4 weeks ago                             [facility] Adult PT Treatment/Exercise - 04/10/21 1041      Transfers   Transfers Sit to Stand    Sit to Stand 3: Mod assist;4: Min assist   from wheelchair on initial rep   Sit to Stand Details Verbal cues for technique;Tactile cues for weight shifting    Stand to Sit 4: Min assist    Stand to Sit Details (indicate cue type and reason) Verbal cues for technique    Comments pt able to transfer sit to stand from wheelchair to //  bars (pulling up with bars) with CGA to min assist      Ambulation/Gait   Ambulation/Gait Yes    Ambulation/Gait Assistance 4: Min assist    Ambulation Distance (Feet) 115 Feet   23.5', 30'   Assistive device Rolling walker   bariatric RW   Gait Pattern Step-to pattern;Step-through pattern;Decreased step length - right;Decreased stance time - right;Decreased hip/knee flexion - right    Ambulation Surface Level;Indoor      Neuro Re-ed    Neuro Re-ed Details  Pt performed standing balance activities inside // bars with CGA - without UE support on // bars; pt performed reaching from Rt side to left side for 4 cones (held in various locations) and handing to technician standing on left side of // bars;      Knee/Hip Exercises: Standing   Other Standing Knee Exercises Pt performed tap ups to 4" step with LLE for increased weight shifting onto RLE and also for closed  chain strengthening - 10 reps; pt required seated rest period due to fatigue; then performed tap ups to 4" step with RLE with UE support on // bars 10 reps for Rt hip & knee flexor strengthening                    PT Short Term Goals - 04/10/21 1833      PT SHORT TERM GOAL #1   Title Pt will be independent with initial HEP for strengthening and stretching.    Baseline HEP to be established    Time 4    Period Weeks    Status New    Target Date 04/21/21      PT SHORT TERM GOAL #2   Title Pt will perform squat pivot transfers to level surface with min assist.    Baseline mod assist for squat pivot transfers    Time 4    Period Weeks    Status New    Target Date 04/21/20      PT SHORT TERM GOAL #3   Title Pt will be able to perform sit to stand transfer from wheelchair to RW with min assist +1.    Baseline mod assist +2 from mat to RW    Time 4    Period Weeks    Status New    Target Date 04/21/20      PT SHORT TERM GOAL #4   Title Pt will ambulate 78' with RW with +1 mod assist for improved household mobility.    Baseline 20' with RW with mod assist (2nd person present for safety)    Time 4    Period Weeks    Status New    Target Date 04/21/21      PT SHORT TERM GOAL #5   Title Assess need for Rt AFO and obtain orthotic consult if needed.    Baseline Assessment for AFO to be completed    Time 4    Period Weeks    Status New    Target Date 04/21/21             PT Long Term Goals - 04/10/21 1833      PT LONG TERM GOAL #1   Title Pt will perform sit to stand transfers with +1 min assist.    Baseline max assist    Time 8    Period Weeks    Status New      PT LONG TERM GOAL #2   Title Pt will amb.  with RW 100' with +1 min assist for household mobility.    Baseline 20' with mod assist with RW    Time 8    Period Weeks    Status New      PT LONG TERM GOAL #3   Title Pt will be able to ambulate >100' CGA with RW for further improvement in household  mobility.    Baseline 20' mod assist with RW    Time 8    Period Weeks    Status New      PT LONG TERM GOAL #4   Title Pt will be able to maintain standing x 5 min with supervision with light UE support on walker or counter for improved balance and participation with ADLs.    Baseline Bil. UE support on counter for 1" with min assist    Time 8    Period Weeks    Status New      PT LONG TERM GOAL #5   Title Caregiver will assist pt in performing updated HEP to assist with functional strengthening and stretching RLE.    Baseline Dependent    Time 8    Period Weeks    Status New                 Plan - 04/10/21 1828    Clinical Impression Statement Pt demonstrating improvement in gait and endurance as amb. distance increased from 38' in previous session on 04-06-21 to 115' in today's session (initial rep prior to fatigue).  Pt does fatigue quickly and requires frequent short seated rest periods.  Pt also improved today with sit to stand transfers with less assistance required with repetition.  Cont with POC.    Personal Factors and Comorbidities Comorbidity 3+;Fitness;Time since onset of injury/illness/exacerbation    Comorbidities hypertension, tobacco abuse, CAD with non-STEMI 04/2018 as well as left ICA occlusion July 2020    Examination-Activity Limitations Bathing;Hygiene/Grooming;Stairs;Bed Mobility;Locomotion Level;Stand;Toileting;Transfers    Examination-Participation Restrictions Community Activity;Cleaning;Meal Prep;Driving;Shop;Laundry    Stability/Clinical Decision Making Evolving/Moderate complexity    Rehab Potential Good    PT Frequency 2x / week    PT Duration 8 weeks   10 weeks plus eval   PT Treatment/Interventions ADLs/Self Care Home Management;Moist Heat;Electrical Stimulation;Gait training;DME Instruction;Functional mobility training;Stair training;Neuromuscular re-education;Balance training;Therapeutic exercise;Therapeutic activities;Patient/family  education;Orthotic Fit/Training;Vestibular;Passive range of motion;Manual techniques;Wheelchair mobility training    PT Next Visit Plan Contine RLE strengthening, standing balance, transfers and gait training with bariatric RW.    Consulted and Agree with Plan of Care Patient;Other (Comment)   girlfriend - [name]          Patient will benefit from skilled therapeutic intervention in order to improve the following deficits and impairments:  Abnormal gait,Decreased activity tolerance,Decreased balance,Decreased endurance,Decreased mobility,Decreased knowledge of use of DME,Decreased range of motion,Decreased strength,Impaired UE functional use,Impaired tone  Visit Diagnosis: Other abnormalities of gait and mobility  Hemiplegia and hemiparesis following cerebral infarction affecting right non-dominant side (HCC)  Unsteadiness on feet     Problem List Patient Active Problem List   Diagnosis Date Noted  . CKD (chronic kidney disease) stage 2, GFR 60-89 ml/min 10/18/2020  . COVID-19   . Acute renal failure superimposed on stage 3a chronic kidney disease (HCC)   . Pneumonia due to COVID-19 virus 09/02/2020  . Acute ischemic left middle cerebral artery (MCA) stroke (HCC) 05/11/2020  . Cerebral infarction, watershed distribution, unilateral, acute (HCC) 05/11/2020  . Dysarthria as late effect of cerebrovascular accident (CVA) 05/11/2020  . Visual  field constriction, bilateral 05/11/2020  . Hemiparesis affecting right side as late effect of cerebrovascular accident (HCC) 05/11/2020  . Dysarthria, post-stroke 04/07/2020  . 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  . Hyperglycemia   . Anemia   . Acute pain of right knee   . Primary osteoarthritis of right knee   . Expressive language impairment   . Benign essential HTN   . Acute bilat watershed infarction [facility]) 03/16/2020  . Cerebral infarction, watershed distribution, unilateral, chronic 03/16/2020  . Acute renal failure (HCC)   . History  of CVA in adulthood   . Dyslipidemia   . CVA (cerebral vascular accident) (HCC) 03/11/2020  . Leukocytosis   . Hemiparesis affecting dominant side as late effect of stroke (HCC)   . Labile blood pressure   . Sleep disturbance   . Benign hypertensive heart and kidney disease with diastolic CHF, NYHA class II and CKD stage III (HCC)   . Chronic systolic congestive heart failure (HCC)   . Morbid obesity (HCC)   . Uncontrolled hypertension   . Dysphagia, post-stroke   . Cardiomegaly   . Acute systolic congestive heart failure (HCC)   . 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 Cerebral embolism with cerebral infarction 07/11/2019  . Acute CVA (cerebrovascular accident) (HCC) 07/11/2019  . Slurred speech 07/10/2019  . Cardiomyopathy (HCC) 11/20/2018  . History of stroke 09/06/2018  . Tobacco use 09/06/2018  . History of non-ST elevation myocardial infarction (NSTEMI) 05/05/2018  . Severe Vitamin D deficiency 04/02/2018  . Community acquired pneumonia of left lower lobe of lung 04/02/2018  . CKD (chronic kidney disease), stage III (HCC) = Secondary to Hypertension 04/02/2018  . Pneumonia 03/30/2018  . Metabolic acidosis 03/30/2018  . AKI (acute kidney injury) (HCC) 03/30/2018  . N&V (nausea and vomiting) 03/30/2018  . Essential hypertension 03/30/2018    DildayDonavan Burnet, PT 04/10/2021, 6:38 PM  [facility] [address] [place], [place], [address] Phone: [phone]   Fax:  [phone]  Name: Zachary Mccann MRN: [id number] Date of Birth: 07/28/1971

## 2021-04-12 ENCOUNTER — Encounter (INDEPENDENT_AMBULATORY_CARE_PROVIDER_SITE_OTHER): Payer: Self-pay | Admitting: Nurse Practitioner

## 2021-04-12 ENCOUNTER — Ambulatory Visit (INDEPENDENT_AMBULATORY_CARE_PROVIDER_SITE_OTHER): Payer: Medicaid Other | Admitting: Nurse Practitioner

## 2021-04-13 ENCOUNTER — Ambulatory Visit: Payer: Medicaid Other

## 2021-04-16 DIAGNOSIS — Z419 Encounter for procedure for purposes other than remedying health state, unspecified: Secondary | ICD-10-CM | POA: Diagnosis not present

## 2021-04-17 ENCOUNTER — Other Ambulatory Visit: Payer: Self-pay

## 2021-04-17 ENCOUNTER — Ambulatory Visit: Payer: Medicaid Other | Attending: Adult Health

## 2021-04-17 DIAGNOSIS — R2689 Other abnormalities of gait and mobility: Secondary | ICD-10-CM | POA: Insufficient documentation

## 2021-04-17 DIAGNOSIS — M6281 Muscle weakness (generalized): Secondary | ICD-10-CM | POA: Diagnosis not present

## 2021-04-17 DIAGNOSIS — I69353 Hemiplegia and hemiparesis following cerebral infarction affecting right non-dominant side: Secondary | ICD-10-CM | POA: Diagnosis not present

## 2021-04-17 DIAGNOSIS — R2681 Unsteadiness on feet: Secondary | ICD-10-CM | POA: Diagnosis not present

## 2021-04-17 NOTE — Therapy (Signed)
[facility] [address] [place], [place], [address] Phone: [phone]   Fax:  [phone]  Physical Therapy Treatment  Patient Details  Name: Zachary Mccann MRN: [id number] Date of Birth: [birth year] Referring Provider (PT): Ihor Austin, NP   Encounter Date: 04/17/2021   PT End of Session - 04/17/21 1020    Visit Number 6    Number of Visits 17    Date for PT Re-Evaluation 05/19/21    Authorization Type Medicaid 4/8-6/20 16 visits    Authorization - Visit Number 5    Authorization - Number of Visits 16    PT Start Time 1018    PT Stop Time 1102    PT Time Calculation (min) 44 min    Equipment Utilized During Treatment Gait belt    Activity Tolerance Patient tolerated treatment well    Behavior During Therapy [facility] for tasks assessed/performed           Past Medical History:  Diagnosis Date  . CAD (coronary artery disease)    a. s/p NSTEMI in 04/2018 with DES to LCx.   [name] Hypertension   . Myocardial infarction (HCC)   . Pneumonia   . Stroke [facility])     Past Surgical History:  Procedure Laterality Date  . CORONARY STENT INTERVENTION N/A 05/05/2018   Procedure: CORONARY STENT INTERVENTION;  Surgeon: Marykay Lex, MD;  Location: [facility] INVASIVE CV LAB;  Service: Cardiovascular;  Laterality: N/A;  . LEFT HEART CATH AND CORONARY ANGIOGRAPHY N/A 05/05/2018   Procedure: LEFT HEART CATH AND CORONARY ANGIOGRAPHY;  Surgeon: Marykay Lex, MD;  Location: [facility] INVASIVE CV LAB;  Service: Cardiovascular;  Laterality: N/A;  . TEE WITHOUT CARDIOVERSION N/A 09/08/2018   Procedure: TRANSESOPHAGEAL ECHOCARDIOGRAM (TEE);  Surgeon: Laurey Morale, MD;  Location: [facility] ENDOSCOPY;  Service: Cardiovascular;  Laterality: N/A;    There were no vitals filed for this visit.   Subjective Assessment - 04/17/21 1021    Subjective Pt reports that left low back is bothering him some when sitting longer periods. Standing or laying help.     Patient is accompained by: --   Stasia Cavalier - girlfriend   Pertinent History history of hypertension, tobacco abuse, CAD with non-STEMI 04/2018 as well as left ICA occlusion July 2020 with associated CVA causing right side weakness and slurred speech. New watershed 3/21; Covid 9/21 - hospitalized 9-17- -09-04-20    Patient Stated Goals increase strength, be able to stand and walk as he was able to before Covid    Currently in Pain? Yes    Pain Score 6     Pain Location Back    Pain Orientation Left;Lower    Pain Descriptors / Indicators Aching    Pain Type Acute pain    Pain Onset 1 to 4 weeks ago    Pain Frequency Intermittent    Aggravating Factors  prolonged sitting    Pain Relieving Factors standing. laying                             [facility] Adult PT Treatment/Exercise - 04/17/21 1022      Transfers   Transfers Sit to Stand;Stand to Sit    Sit to Stand 3: Mod assist;4: Min assist    Sit to Stand Details Verbal cues for technique;Tactile cues for weight shifting    Sit to Stand Details (indicate cue type and reason) PT assisted to get RLE back some  prior to standing. Verbal cues to lean forward pushing from chair. Pt was min assist from mat and mod assist from w/c with pillow in it.    Stand to Sit 4: Min assist    Stand to Sit Details (indicate cue type and reason) Verbal cues for technique    Stand to Sit Details Pt was cued to reach back to control descent. Some decreased control the last bit on descent.    Comments Pt performed sit to stand from 21" mat x 5 with verbal cues to lean forward. Pt did well when he leaned forward more almost done to CGA.      Ambulation/Gait   Ambulation/Gait Yes    Ambulation/Gait Assistance 4: Min guard;4: Min assist    Ambulation/Gait Assistance Details Pt was cued to increase left step length which he did well with increasing right stance time. Verbal cues to stay up tall throughout. PT also provided some tactile cues at right pelvis to  anteriorly rotate with swing phase. HR=116 after and BP=125/91 after 5 min rest on BP    Ambulation Distance (Feet) 120 Feet   30' x 1   Assistive device Rolling walker   bariatric   Gait Pattern Step-through pattern;Decreased stance time - right;Decreased step length - right    Ambulation Surface Level;Indoor      Neuro Re-ed    Neuro Re-ed Details  Standing without UE support x 30 sec then with adding in reaching in various directions x 30 sec. Pt was given verbal cues for erect posture. Standing at walker with light UE support weight shifting to right and back with cue to try to bump right hip in to therapist's hip with tactile cues for erect posture and to keep right pelvis forward.                  PT Education - 04/17/21 1118    Education Details PT discussed with pt and girlfriend trying to walk some down hallway at home with child following behind with w/c and girfriend on right side ~20' to try to work on building up stamina more at home.    Person(s) Educated Patient;Other (comment)   girlfriend   Methods Explanation;Demonstration    Comprehension Verbalized understanding            PT Short Term Goals - 04/17/21 1119      PT SHORT TERM GOAL #1   Title Pt will be independent with initial HEP for strengthening and stretching.    Baseline HEP to be established    Time 4    Period Weeks    Status New    Target Date 04/21/21      PT SHORT TERM GOAL #2   Title Pt will perform squat pivot transfers to level surface with min assist.    Baseline mod assist for squat pivot transfers    Time 4    Period Weeks    Status New    Target Date 04/21/20      PT SHORT TERM GOAL #3   Title Pt will be able to perform sit to stand transfer from wheelchair to RW with min assist +1.    Baseline mod assist +2 from mat to RW    Time 4    Period Weeks    Status New    Target Date 04/21/20      PT SHORT TERM GOAL #4   Title Pt will ambulate 69' with RW with +1 mod assist for  improved household mobility.    Baseline 20' with RW with mod assist (2nd person present for safety). 04/17/21 min assist up to 115' varying throughout treatment on distance as fatigues.    Time 4    Period Weeks    Status Achieved    Target Date 04/21/21      PT SHORT TERM GOAL #5   Title Assess need for Rt AFO and obtain orthotic consult if needed.    Baseline Assessment for AFO to be completed    Time 4    Period Weeks    Status New    Target Date 04/21/21             PT Long Term Goals - 04/10/21 1833      PT LONG TERM GOAL #1   Title Pt will perform sit to stand transfers with +1 min assist.    Baseline max assist    Time 8    Period Weeks    Status New      PT LONG TERM GOAL #2   Title Pt will amb. with RW 100' with +1 min assist for household mobility.    Baseline 20' with mod assist with RW    Time 8    Period Weeks    Status New      PT LONG TERM GOAL #3   Title Pt will be able to ambulate >100' CGA with RW for further improvement in household mobility.    Baseline 20' mod assist with RW    Time 8    Period Weeks    Status New      PT LONG TERM GOAL #4   Title Pt will be able to maintain standing x 5 min with supervision with light UE support on walker or counter for improved balance and participation with ADLs.    Baseline Bil. UE support on counter for 1" with min assist    Time 8    Period Weeks    Status New      PT LONG TERM GOAL #5   Title Caregiver will assist pt in performing updated HEP to assist with functional strengthening and stretching RLE.    Baseline Dependent    Time 8    Period Weeks    Status New                 Plan - 04/17/21 1120    Clinical Impression Statement Pt again able to increase gait distance with increased right stance time taking longer left step. Does fatigue throughout session. Requiring less assistance with sit to stand transfers from w/c with 1 person assist today. Back felt better as session went on.     Personal Factors and Comorbidities Comorbidity 3+;Fitness;Time since onset of injury/illness/exacerbation    Comorbidities hypertension, tobacco abuse, CAD with non-STEMI 04/2018 as well as left ICA occlusion July 2020    Examination-Activity Limitations Bathing;Hygiene/Grooming;Stairs;Bed Mobility;Locomotion Level;Stand;Toileting;Transfers    Examination-Participation Restrictions Community Activity;Cleaning;Meal Prep;Driving;Shop;Laundry    Stability/Clinical Decision Making Evolving/Moderate complexity    Rehab Potential Good    PT Frequency 2x / week    PT Duration 8 weeks   10 weeks plus eval   PT Treatment/Interventions ADLs/Self Care Home Management;Moist Heat;Electrical Stimulation;Gait training;DME Instruction;Functional mobility training;Stair training;Neuromuscular re-education;Balance training;Therapeutic exercise;Therapeutic activities;Patient/family education;Orthotic Fit/Training;Vestibular;Passive range of motion;Manual techniques;Wheelchair mobility training    PT Next Visit Plan Check remaining STGs next visit. Did they get to try walking at home? If so, how did it go? Contine RLE  strengthening, standing balance, transfers and gait training with bariatric RW.    Consulted and Agree with Plan of Care Patient;Other (Comment)   girlfriend - [name]          Patient will benefit from skilled therapeutic intervention in order to improve the following deficits and impairments:  Abnormal gait,Decreased activity tolerance,Decreased balance,Decreased endurance,Decreased mobility,Decreased knowledge of use of DME,Decreased range of motion,Decreased strength,Impaired UE functional use,Impaired tone  Visit Diagnosis: Other abnormalities of gait and mobility  Muscle weakness (generalized)  Hemiplegia and hemiparesis following cerebral infarction affecting right non-dominant side [facility])     Problem List Patient Active Problem List   Diagnosis Date Noted  . CKD (chronic kidney disease)  stage 2, GFR 60-89 ml/min 10/18/2020  . COVID-19   . Acute renal failure superimposed on stage 3a chronic kidney disease (HCC)   . Pneumonia due to COVID-19 virus 09/02/2020  . Acute ischemic left middle cerebral artery (MCA) stroke (HCC) 05/11/2020  . Cerebral infarction, watershed distribution, unilateral, acute (HCC) 05/11/2020  . Dysarthria as late effect of cerebrovascular accident (CVA) 05/11/2020  . Visual field constriction, bilateral 05/11/2020  . Hemiparesis affecting right side as late effect of cerebrovascular accident (HCC) 05/11/2020  . Dysarthria, post-stroke 04/07/2020  . 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  . Hyperglycemia   . Anemia   . Acute pain of right knee   . Primary osteoarthritis of right knee   . Expressive language impairment   . Benign essential HTN   . Acute bilat watershed infarction [facility]) 03/16/2020  . Cerebral infarction, watershed distribution, unilateral, chronic 03/16/2020  . Acute renal failure (HCC)   . History of CVA in adulthood   . Dyslipidemia   . CVA (cerebral vascular accident) (HCC) 03/11/2020  . Leukocytosis   . Hemiparesis affecting dominant side as late effect of stroke (HCC)   . Labile blood pressure   . Sleep disturbance   . Benign hypertensive heart and kidney disease with diastolic CHF, NYHA class II and CKD stage III (HCC)   . Chronic systolic congestive heart failure (HCC)   . Morbid obesity (HCC)   . Uncontrolled hypertension   . Dysphagia, post-stroke   . Cardiomegaly   . Acute systolic congestive heart failure (HCC)   . 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 Cerebral embolism with cerebral infarction 07/11/2019  . Acute CVA (cerebrovascular accident) (HCC) 07/11/2019  . Slurred speech 07/10/2019  . Cardiomyopathy (HCC) 11/20/2018  . History of stroke 09/06/2018  . Tobacco use 09/06/2018  . History of non-ST elevation myocardial infarction (NSTEMI) 05/05/2018  . Severe Vitamin D deficiency 04/02/2018  .  Community acquired pneumonia of left lower lobe of lung 04/02/2018  . CKD (chronic kidney disease), stage III (HCC) = Secondary to Hypertension 04/02/2018  . Pneumonia 03/30/2018  . Metabolic acidosis 03/30/2018  . AKI (acute kidney injury) (HCC) 03/30/2018  . N&V (nausea and vomiting) 03/30/2018  . Essential hypertension 03/30/2018    Ronn Melena, PT, DPT, NCS 04/17/2021, 11:24 AM  [facility] [address] [place], [place], [address] Phone: [phone]   Fax:  [phone]  Name: Zachary Mccann MRN: [id number] Date of Birth: 03-20-1971

## 2021-04-20 ENCOUNTER — Encounter: Payer: Self-pay | Admitting: Physical Therapy

## 2021-04-20 ENCOUNTER — Other Ambulatory Visit: Payer: Self-pay

## 2021-04-20 ENCOUNTER — Ambulatory Visit: Payer: Medicaid Other | Admitting: Physical Therapy

## 2021-04-20 DIAGNOSIS — R2689 Other abnormalities of gait and mobility: Secondary | ICD-10-CM

## 2021-04-20 DIAGNOSIS — M6281 Muscle weakness (generalized): Secondary | ICD-10-CM

## 2021-04-20 DIAGNOSIS — R2681 Unsteadiness on feet: Secondary | ICD-10-CM | POA: Diagnosis not present

## 2021-04-20 DIAGNOSIS — I69353 Hemiplegia and hemiparesis following cerebral infarction affecting right non-dominant side: Secondary | ICD-10-CM | POA: Diagnosis not present

## 2021-04-20 NOTE — Therapy (Signed)
[facility] [address] [place], [place], [address] Phone: [phone]   Fax:  [phone]  Physical Therapy Treatment  Patient Details  Name: Zachary Mccann MRN: [id number] Date of Birth: [birth year] Referring Provider (PT): Ihor Austin, NP   Encounter Date: 04/20/2021   PT End of Session - 04/20/21 2021    Visit Number 7    Number of Visits 17    Date for PT Re-Evaluation 05/19/21    Authorization Type Medicaid 4/8-6/20 16 visits    Authorization - Visit Number 6    Authorization - Number of Visits 16    PT Start Time 1020    PT Stop Time 1103    PT Time Calculation (min) 43 min    Equipment Utilized During Treatment Gait belt    Activity Tolerance Patient tolerated treatment well    Behavior During Therapy [facility] for tasks assessed/performed           Past Medical History:  Diagnosis Date  . CAD (coronary artery disease)    a. s/p NSTEMI in 04/2018 with DES to LCx.   [name] Hypertension   . Myocardial infarction (HCC)   . Pneumonia   . Stroke [facility])     Past Surgical History:  Procedure Laterality Date  . CORONARY STENT INTERVENTION N/A 05/05/2018   Procedure: CORONARY STENT INTERVENTION;  Surgeon: Marykay Lex, MD;  Location: [facility] INVASIVE CV LAB;  Service: Cardiovascular;  Laterality: N/A;  . LEFT HEART CATH AND CORONARY ANGIOGRAPHY N/A 05/05/2018   Procedure: LEFT HEART CATH AND CORONARY ANGIOGRAPHY;  Surgeon: Marykay Lex, MD;  Location: [facility] INVASIVE CV LAB;  Service: Cardiovascular;  Laterality: N/A;  . TEE WITHOUT CARDIOVERSION N/A 09/08/2018   Procedure: TRANSESOPHAGEAL ECHOCARDIOGRAM (TEE);  Surgeon: Laurey Morale, MD;  Location: [facility] ENDOSCOPY;  Service: Cardiovascular;  Laterality: N/A;    There were no vitals filed for this visit.   Subjective Assessment - 04/20/21 1014    Subjective Girlfriend states pt has been trying to pull himself up to stand at home - he is able to do this by himself  but may take a little bit of tme    Patient is accompained by: --   [name] - girlfriend   Pertinent History history of hypertension, tobacco abuse, CAD with non-STEMI 04/2018 as well as left ICA occlusion July 2020 with associated CVA causing right side weakness and slurred speech. New watershed 3/21; Covid 9/21 - hospitalized 9-17- -09-04-20    Patient Stated Goals increase strength, be able to stand and walk as he was able to before Covid    Currently in Pain? Yes    Pain Score 7     Pain Location Leg    Pain Orientation Right    Pain Descriptors / Indicators Aching;Tightness;Discomfort    Pain Type Neuropathic pain    Pain Onset More than a month ago    Pain Frequency Intermittent                             [facility] Adult PT Treatment/Exercise - 04/20/21 1031      Transfers   Transfers Sit to Stand;Stand to Sit    Sit to Stand 3: Mod assist   +1 person only   Sit to Stand Details Verbal cues for technique;Tactile cues for weight shifting;Tactile cues for placement    Sit to Stand Details (indicate cue type and reason) assisted to move RLE back  for increased weight bearing with sit to stand    Stand to Sit 4: Min assist      Ambulation/Gait   Ambulation/Gait Yes    Ambulation/Gait Assistance 4: Min assist    Ambulation/Gait Assistance Details Foot up brace used on RLE after initial gait training rep (65') ; this orthosis beneficial in increasing dorsiflexion in swing phase of gait    Ambulation Distance (Feet) 65 Feet   50, 55, 50   Assistive device Rolling walker   bariatric   Gait Pattern Step-through pattern;Decreased stance time - right;Decreased step length - right    Ambulation Surface Level;Indoor      Knee/Hip Exercises: Aerobic   Recumbent Bike SciFit Level 2.0 with UE's and LE's (arms at #10); performed from wheelchair                  PT Education - 04/20/21 2021    Education Details gave pt and girlfriend info from [place] for ordering Foot up  brace    Person(s) Educated Patient;Caregiver(s)    Methods Explanation;Handout    Comprehension Verbalized understanding;Returned demonstration            PT Short Term Goals - 04/20/21 2027      PT SHORT TERM GOAL #1   Title Pt will be independent with initial HEP for strengthening and stretching.    Baseline HEP to be established    Time 4    Period Weeks    Status New    Target Date 04/21/21      PT SHORT TERM GOAL #2   Title Pt will perform squat pivot transfers to level surface with min assist.    Baseline mod assist for squat pivot transfers    Time 4    Period Weeks    Status New    Target Date 04/21/20      PT SHORT TERM GOAL #3   Title Pt will be able to perform sit to stand transfer from wheelchair to RW with min assist +1.    Baseline mod assist +2 from mat to RW    Time 4    Period Weeks    Status New    Target Date 04/21/20      PT SHORT TERM GOAL #4   Title Pt will ambulate 23' with RW with +1 mod assist for improved household mobility.    Baseline 20' with RW with mod assist (2nd person present for safety). 04/17/21 min assist up to 115' varying throughout treatment on distance as fatigues.    Time 4    Period Weeks    Status Achieved    Target Date 04/21/21      PT SHORT TERM GOAL #5   Title Assess need for Rt AFO and obtain orthotic consult if needed.    Baseline Assessment for AFO to be completed; AFO not needed - Foot up brace on RLE beneficial - 04-20-21    Time 4    Period Weeks    Status Achieved    Target Date 04/21/21             PT Long Term Goals - 04/20/21 2028      PT LONG TERM GOAL #1   Title Pt will perform sit to stand transfers with +1 min assist.    Baseline max assist    Time 8    Period Weeks    Status New      PT LONG TERM GOAL #2   Title  Pt will amb. with RW 100' with +1 min assist for household mobility.    Baseline 20' with mod assist with RW    Time 8    Period Weeks    Status New      PT LONG TERM GOAL #3    Title Pt will be able to ambulate >100' CGA with RW for further improvement in household mobility.    Baseline 20' mod assist with RW    Time 8    Period Weeks    Status New      PT LONG TERM GOAL #4   Title Pt will be able to maintain standing x 5 min with supervision with light UE support on walker or counter for improved balance and participation with ADLs.    Baseline Bil. UE support on counter for 1" with min assist    Time 8    Period Weeks    Status New      PT LONG TERM GOAL #5   Title Caregiver will assist pt in performing updated HEP to assist with functional strengthening and stretching RLE.    Baseline Dependent    Time 8    Period Weeks    Status New                 Plan - 04/20/21 2023    Clinical Impression Statement Pt demonstrates slow steady improvement in endurance with pt amb. 4 reps of approx. 50'-65' in today's session; pt needed seated rest break after this distance.  Pt reported it was hard to breathe with the mask on.  The foot up brace on his RLE was very beneficial in lifting toes to provide increased clearance of Rt foot in swing phase of gait.  Info on this orthosis was given to pt's girlfriend for ordering online.  Cont with POC.    Personal Factors and Comorbidities Comorbidity 3+;Fitness;Time since onset of injury/illness/exacerbation    Comorbidities hypertension, tobacco abuse, CAD with non-STEMI 04/2018 as well as left ICA occlusion July 2020    Examination-Activity Limitations Bathing;Hygiene/Grooming;Stairs;Bed Mobility;Locomotion Level;Stand;Toileting;Transfers    Examination-Participation Restrictions Community Activity;Cleaning;Meal Prep;Driving;Shop;Laundry    Stability/Clinical Decision Making Evolving/Moderate complexity    Rehab Potential Good    PT Frequency 2x / week    PT Duration 8 weeks   10 weeks plus eval   PT Treatment/Interventions ADLs/Self Care Home Management;Moist Heat;Electrical Stimulation;Gait training;DME  Instruction;Functional mobility training;Stair training;Neuromuscular re-education;Balance training;Therapeutic exercise;Therapeutic activities;Patient/family education;Orthotic Fit/Training;Vestibular;Passive range of motion;Manual techniques;Wheelchair mobility training    PT Next Visit Plan Contine RLE strengthening, standing balance, transfers and gait training with bariatric RW.    Consulted and Agree with Plan of Care Patient;Other (Comment)   girlfriend - [name]          Patient will benefit from skilled therapeutic intervention in order to improve the following deficits and impairments:  Abnormal gait,Decreased activity tolerance,Decreased balance,Decreased endurance,Decreased mobility,Decreased knowledge of use of DME,Decreased range of motion,Decreased strength,Impaired UE functional use,Impaired tone  Visit Diagnosis: Other abnormalities of gait and mobility  Muscle weakness (generalized)     Problem List Patient Active Problem List   Diagnosis Date Noted  . CKD (chronic kidney disease) stage 2, GFR 60-89 ml/min 10/18/2020  . COVID-19   . Acute renal failure superimposed on stage 3a chronic kidney disease (HCC)   . Pneumonia due to COVID-19 virus 09/02/2020  . Acute ischemic left middle cerebral artery (MCA) stroke (HCC) 05/11/2020  . Cerebral infarction, watershed distribution, unilateral, acute (HCC) 05/11/2020  .  Dysarthria as late effect of cerebrovascular accident (CVA) 05/11/2020  . Visual field constriction, bilateral 05/11/2020  . Hemiparesis affecting right side as late effect of cerebrovascular accident (HCC) 05/11/2020  . Dysarthria, post-stroke 04/07/2020  . 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  . Hyperglycemia   . Anemia   . Acute pain of right knee   . Primary osteoarthritis of right knee   . Expressive language impairment   . Benign essential HTN   . Acute bilat watershed infarction [facility]) 03/16/2020  . Cerebral infarction, watershed distribution,  unilateral, chronic 03/16/2020  . Acute renal failure (HCC)   . History of CVA in adulthood   . Dyslipidemia   . CVA (cerebral vascular accident) (HCC) 03/11/2020  . Leukocytosis   . Hemiparesis affecting dominant side as late effect of stroke (HCC)   . Labile blood pressure   . Sleep disturbance   . Benign hypertensive heart and kidney disease with diastolic CHF, NYHA class II and CKD stage III (HCC)   . Chronic systolic congestive heart failure (HCC)   . Morbid obesity (HCC)   . Uncontrolled hypertension   . Dysphagia, post-stroke   . Cardiomegaly   . Acute systolic congestive heart failure (HCC)   . 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 Cerebral embolism with cerebral infarction 07/11/2019  . Acute CVA (cerebrovascular accident) (HCC) 07/11/2019  . Slurred speech 07/10/2019  . Cardiomyopathy (HCC) 11/20/2018  . History of stroke 09/06/2018  . Tobacco use 09/06/2018  . History of non-ST elevation myocardial infarction (NSTEMI) 05/05/2018  . Severe Vitamin D deficiency 04/02/2018  . Community acquired pneumonia of left lower lobe of lung 04/02/2018  . CKD (chronic kidney disease), stage III (HCC) = Secondary to Hypertension 04/02/2018  . Pneumonia 03/30/2018  . Metabolic acidosis 03/30/2018  . AKI (acute kidney injury) (HCC) 03/30/2018  . N&V (nausea and vomiting) 03/30/2018  . Essential hypertension 03/30/2018    DildayDonavan Burnet, PT 04/20/2021, 8:32 PM  [facility] [address] [place], [place], [address] Phone: [phone]   Fax:  [phone]  Name: Zachary Mccann MRN: [id number] Date of Birth: 1971-10-04

## 2021-04-24 ENCOUNTER — Other Ambulatory Visit: Payer: Self-pay

## 2021-04-24 ENCOUNTER — Encounter: Payer: Self-pay | Admitting: Physical Therapy

## 2021-04-24 ENCOUNTER — Ambulatory Visit: Payer: Medicaid Other | Admitting: Physical Therapy

## 2021-04-24 DIAGNOSIS — I69353 Hemiplegia and hemiparesis following cerebral infarction affecting right non-dominant side: Secondary | ICD-10-CM | POA: Diagnosis not present

## 2021-04-24 DIAGNOSIS — R2689 Other abnormalities of gait and mobility: Secondary | ICD-10-CM | POA: Diagnosis not present

## 2021-04-24 DIAGNOSIS — R2681 Unsteadiness on feet: Secondary | ICD-10-CM

## 2021-04-24 DIAGNOSIS — M6281 Muscle weakness (generalized): Secondary | ICD-10-CM

## 2021-04-24 NOTE — Therapy (Signed)
[facility] [address] [place], [place], [address] Phone: [phone]   Fax:  [phone]  Physical Therapy Treatment  Patient Details  Name: Zachary Mccann MRN: [id number] Date of Birth: 1971-11-05 Referring Provider (PT): Frann Rider, NP   Encounter Date: 04/24/2021   PT End of Session - 04/24/21 1851    Visit Number 8    Number of Visits 17    Date for PT Re-Evaluation 05/19/21    Authorization Type Medicaid 4/8-6/20 16 visits    Authorization - Visit Number 7    Authorization - Number of Visits 16    PT Start Time 4818    PT Stop Time 1102    PT Time Calculation (min) 41 min    Equipment Utilized During Treatment Gait belt    Activity Tolerance Patient tolerated treatment well    Behavior During Therapy [facility] for tasks assessed/performed           Past Medical History:  Diagnosis Date  . CAD (coronary artery disease)    a. s/p NSTEMI in 04/2018 with DES to LCx.   [name] Hypertension   . Myocardial infarction ([name])   . Pneumonia   . Stroke [facility])     Past Surgical History:  Procedure Laterality Date  . CORONARY STENT INTERVENTION N/A 05/05/2018   Procedure: CORONARY STENT INTERVENTION;  Surgeon: Leonie Man, MD;  Location: [place] CV LAB;  Service: Cardiovascular;  Laterality: N/A;  . LEFT HEART CATH AND CORONARY ANGIOGRAPHY N/A 05/05/2018   Procedure: LEFT HEART CATH AND CORONARY ANGIOGRAPHY;  Surgeon: Leonie Man, MD;  Location: [place] CV LAB;  Service: Cardiovascular;  Laterality: N/A;  . TEE WITHOUT CARDIOVERSION N/A 09/08/2018   Procedure: TRANSESOPHAGEAL ECHOCARDIOGRAM (TEE);  Surgeon: Larey Dresser, MD;  Location: [facility] ENDOSCOPY;  Service: Cardiovascular;  Laterality: N/A;    There were no vitals filed for this visit.   Subjective Assessment - 04/24/21 1830    Subjective Pt wearing Foot up brace on RLE - [name] - pt answers "yes" when  asked if he thought it helped (this was beneficial when we trialed in clinic last Thursday)    Patient is accompained by: --   [name] - girlfriend   Pertinent History history of hypertension, tobacco abuse, CAD with non-STEMI 04/2018 as well as left ICA occlusion July 2020 with associated CVA causing right side weakness and slurred speech. New watershed 3/21; Covid 9/21 - hospitalized 9-17- -09-04-20    Patient Stated Goals increase strength, be able to stand and walk as he was able to before Covid    Currently in Pain? Yes    Pain Score 5     Pain Location Leg    Pain Orientation Right    Pain Descriptors / Indicators Aching;Tightness;Discomfort    Pain Type Neuropathic pain    Pain Onset More than a month ago    Pain Frequency Intermittent                             [facility] Adult PT Treatment/Exercise - 04/24/21 1028      Transfers   Transfers Sit to Stand;Stand to Sit    Sit to Stand 3: Mod assist   from assist from wheelchair to RW:  CGA from w'c to // bars x 3 reps   Sit to Stand Details Verbal cues for technique;Tactile cues for weight shifting;Tactile cues  for placement    Stand to Sit 4: Min assist      Ambulation/Gait   Ambulation/Gait Yes    Ambulation/Gait Assistance 4: Min assist    Ambulation/Gait Assistance Details pt wearing his Foot up brace on RLE    Ambulation Distance (Feet) 115 Feet    Assistive device Rolling walker   bariatric   Gait Pattern Step-through pattern;Decreased stance time - right;Decreased step length - right    Ambulation Surface Level;Indoor      Neuro Re-ed    Neuro Re-ed Details  Pt stood in // bars without UE support with CGA tossing/catching ball to PT technician, Thurmond Butts - 5 reps x 2 sets; pt c/o fatigue after 5 reps and required seated rest period      Knee/Hip Exercises: Aerobic   Recumbent Bike SciFit Level 2.0x 3.5" with UE's and LE's (arms at #10); performed from wheelchair      Knee/Hip Exercises: Standing   Other  Standing Knee Exercises Pt performed tap ups to 4" 5 reps each LE with min assist with UE support on // bars; pt then performed tap ups to 6" step 5 reps each foot with bil. UE support on // bars    Other Standing Knee Exercises Pt performed stepping up onto 4" step (LLE leading) x 1 rep with bil. UE support; then stepped up onto 6" step with LLE leading and then down to floor x 1 rep with bil. UE support with min assist                    PT Short Term Goals - 04/24/21 1851      PT SHORT TERM GOAL #1   Title Pt will be independent with initial HEP for strengthening and stretching.    Baseline HEP to be established    Time 4    Period Weeks    Status Achieved    Target Date 04/21/21      PT SHORT TERM GOAL #2   Title Pt will perform squat pivot transfers to level surface with min assist.    Baseline mod assist for squat pivot transfers; met 04-24-21    Time 4    Period Weeks    Status Achieved    Target Date 04/21/20      PT SHORT TERM GOAL #3   Title Pt will be able to perform sit to stand transfer from wheelchair to RW with min assist +1.    Baseline mod assist +2 from mat to RW; min to mod assist +1 on 04-24-21    Time 4    Period Weeks    Status Partially Met    Target Date 04/21/20      PT SHORT TERM GOAL #4   Title Pt will ambulate 25' with RW with +1 mod assist for improved household mobility.    Baseline 20' with RW with mod assist (2nd person present for safety). 04/17/21 min assist up to 115' varying throughout treatment on distance as fatigues.    Time 4    Period Weeks    Status Achieved    Target Date 04/21/21      PT SHORT TERM GOAL #5   Title Assess need for Rt AFO and obtain orthotic consult if needed.    Baseline Assessment for AFO to be completed; AFO not needed - Foot up brace on RLE beneficial - 04-20-21    Time 4    Period Weeks    Status Achieved  Target Date 04/21/21             PT Long Term Goals - 04/24/21 1856      PT LONG TERM GOAL  #1   Title Pt will perform sit to stand transfers with +1 min assist.    Baseline max assist    Time 8    Period Weeks    Status New      PT LONG TERM GOAL #2   Title Pt will amb. with RW 100' with +1 min assist for household mobility.    Baseline 20' with mod assist with RW    Time 8    Period Weeks    Status New      PT LONG TERM GOAL #3   Title Pt will be able to ambulate >100' CGA with RW for further improvement in household mobility.    Baseline 20' mod assist with RW    Time 8    Period Weeks    Status New      PT LONG TERM GOAL #4   Title Pt will be able to maintain standing x 5 min with supervision with light UE support on walker or counter for improved balance and participation with ADLs.    Baseline Bil. UE support on counter for 1" with min assist    Time 8    Period Weeks    Status New      PT LONG TERM GOAL #5   Title Caregiver will assist pt in performing updated HEP to assist with functional strengthening and stretching RLE.    Baseline Dependent    Time 8    Period Weeks    Status New                 Plan - 04/24/21 1854    Clinical Impression Statement Pt has met STG's # 1-2, 4-5 and STG #3 partially met as transfers are improving but not consistent at needing only min assist, with performance varying depending on fatigue.  Pt is progressing well with improving mobility and endurance.  Cont with POC.    Personal Factors and Comorbidities Comorbidity 3+;Fitness;Time since onset of injury/illness/exacerbation    Comorbidities hypertension, tobacco abuse, CAD with non-STEMI 04/2018 as well as left ICA occlusion July 2020    Examination-Activity Limitations Bathing;Hygiene/Grooming;Stairs;Bed Mobility;Locomotion Level;Stand;Toileting;Transfers    Examination-Participation Restrictions Community Activity;Cleaning;Meal Prep;Driving;Shop;Laundry    Stability/Clinical Decision Making Evolving/Moderate complexity    Rehab Potential Good    PT Frequency 2x /  week    PT Duration 8 weeks   10 weeks plus eval   PT Treatment/Interventions ADLs/Self Care Home Management;Moist Heat;Electrical Stimulation;Gait training;DME Instruction;Functional mobility training;Stair training;Neuromuscular re-education;Balance training;Therapeutic exercise;Therapeutic activities;Patient/family education;Orthotic Fit/Training;Vestibular;Passive range of motion;Manual techniques;Wheelchair mobility training    PT Next Visit Plan [place] RLE strengthening, standing balance, transfers and gait training with bariatric RW.    Consulted and Agree with Plan of Care Patient;Other (Comment)   girlfriend - [name]          Patient will benefit from skilled therapeutic intervention in order to improve the following deficits and impairments:  Abnormal gait,Decreased activity tolerance,Decreased balance,Decreased endurance,Decreased mobility,Decreased knowledge of use of DME,Decreased range of motion,Decreased strength,Impaired UE functional use,Impaired tone  Visit Diagnosis: Other abnormalities of gait and mobility  Muscle weakness (generalized)  Unsteadiness on feet     Problem List Patient Active Problem List   Diagnosis Date Noted  . CKD (chronic kidney disease) stage 2, GFR 60-89 ml/min 10/18/2020  . COVID-19   .  Acute renal failure superimposed on stage 3a chronic kidney disease ([place])   . Pneumonia due to COVID-19 virus 09/02/2020  . Acute ischemic left middle cerebral artery (MCA) stroke ([place]) 05/11/2020  . Cerebral infarction, watershed distribution, unilateral, acute ([place]) 05/11/2020  . Dysarthria as late effect of cerebrovascular accident (CVA) 05/11/2020  . Visual field constriction, bilateral 05/11/2020  . Hemiparesis affecting right side as late effect of cerebrovascular accident ([place]) 05/11/2020  . Dysarthria, post-stroke 04/07/2020  . 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  . Hyperglycemia   . Anemia   . Acute pain of right knee   . Primary osteoarthritis  of right knee   . Expressive language impairment   . Benign essential HTN   . Acute bilat watershed infarction [facility]) 03/16/2020  . Cerebral infarction, watershed distribution, unilateral, chronic 03/16/2020  . Acute renal failure ([place])   . History of CVA in adulthood   . Dyslipidemia   . CVA (cerebral vascular accident) ([place]) 03/11/2020  . Leukocytosis   . Hemiparesis affecting dominant side as late effect of stroke ([place])   . Labile blood pressure   . Sleep disturbance   . Benign hypertensive heart and kidney disease with diastolic CHF, NYHA class II and CKD stage III ([place])   . Chronic systolic congestive heart failure ([place])   . Morbid obesity ([place])   . Uncontrolled hypertension   . Dysphagia, post-stroke   . Cardiomegaly   . Acute systolic congestive heart failure ([place])   . Left middle cerebral artery stroke ([place]) 07/17/2019  . Cerebral embolism with cerebral infarction 07/11/2019  . Acute CVA (cerebrovascular accident) ([place]) 07/11/2019  . Slurred speech 07/10/2019  . Cardiomyopathy ([place]) 11/20/2018  . History of stroke 09/06/2018  . Tobacco use 09/06/2018  . History of non-ST elevation myocardial infarction (NSTEMI) 05/05/2018  . Severe Vitamin D deficiency 04/02/2018  . Community acquired pneumonia of left lower lobe of lung 04/02/2018  . CKD (chronic kidney disease), stage III ([place]) = Secondary to Hypertension 04/02/2018  . Pneumonia 03/30/2018  . Metabolic acidosis 37/16/9678  . AKI (acute kidney injury) ([place]) 03/30/2018  . N&V (nausea and vomiting) 03/30/2018  . Essential hypertension 03/30/2018    DildayJenness Corner, PT 04/24/2021, 6:58 PM  [facility] [address] [place], [place], [address] Phone: [phone]   Fax:  [phone]  Name: Zachary Mccann MRN: [id number] Date of Birth: [birth year]

## 2021-04-26 ENCOUNTER — Other Ambulatory Visit: Payer: Self-pay

## 2021-04-26 NOTE — Patient Outreach (Signed)
Packaging Man[facility]'s first round of meds to be delivered 05/03/21   Medication BB B L EM BT  Hydralazine 25 mg   1.5  1.5   Atorvastatin 40 mg     1   Amlodipine 10 mg   1     Carvedilol 6.25 mg  1  1   Clopidogrel 75 mg   1     Iron Pill  1     Cholecalciferol 125 mcg    1       Requested refills of OTC's from PCP. F/U 1 Month

## 2021-04-26 NOTE — Progress Notes (Signed)
Unfortunately, we have not seen this patient since November 2021 in our office.  He was a no-show when he had an appointment in April with my nurse practitioner, Jiles Prows.  Therefore, my policy is that I will not refill any medications, even over-the-counter, until the patient is seen in the office for an appointment.  Please inform the patient he needs to make a follow-up appointment.  Thank you.

## 2021-04-27 ENCOUNTER — Ambulatory Visit: Payer: Medicaid Other

## 2021-04-27 ENCOUNTER — Other Ambulatory Visit: Payer: Self-pay

## 2021-04-27 DIAGNOSIS — R2681 Unsteadiness on feet: Secondary | ICD-10-CM | POA: Diagnosis not present

## 2021-04-27 DIAGNOSIS — M6281 Muscle weakness (generalized): Secondary | ICD-10-CM | POA: Diagnosis not present

## 2021-04-27 DIAGNOSIS — R2689 Other abnormalities of gait and mobility: Secondary | ICD-10-CM | POA: Diagnosis not present

## 2021-04-27 DIAGNOSIS — I69353 Hemiplegia and hemiparesis following cerebral infarction affecting right non-dominant side: Secondary | ICD-10-CM | POA: Diagnosis not present

## 2021-04-27 NOTE — Therapy (Signed)
[facility] [address] [place], [place], [address] Phone: [phone]   Fax:  [phone]  Physical Therapy Treatment  Patient Details  Name: Zachary Mccann MRN: [id number] Date of Birth: May 30, 1971 Referring Provider (PT): Frann Rider, NP   Encounter Date: 04/27/2021   PT End of Session - 04/27/21 1019    Visit Number 9    Number of Visits 17    Date for PT Re-Evaluation 05/19/21    Authorization Type Medicaid 4/8-6/20 16 visits    Authorization - Visit Number 8    Authorization - Number of Visits 16    PT Start Time 1016    PT Stop Time 1100    PT Time Calculation (min) 44 min    Equipment Utilized During Treatment Gait belt    Activity Tolerance Patient tolerated treatment well    Behavior During Therapy [facility] for tasks assessed/performed           Past Medical History:  Diagnosis Date  . CAD (coronary artery disease)    a. s/p NSTEMI in 04/2018 with DES to LCx.   [name] Hypertension   . Myocardial infarction ([place])   . Pneumonia   . Stroke [facility])     Past Surgical History:  Procedure Laterality Date  . CORONARY STENT INTERVENTION N/A 05/05/2018   Procedure: CORONARY STENT INTERVENTION;  Surgeon: Leonie Man, MD;  Location: [facility];  Service: Cardiovascular;  Laterality: N/A;  . LEFT HEART CATH AND CORONARY ANGIOGRAPHY N/A 05/05/2018   Procedure: LEFT HEART CATH AND CORONARY ANGIOGRAPHY;  Surgeon: Leonie Man, MD;  Location: [facility];  Service: Cardiovascular;  Laterality: N/A;  . TEE WITHOUT CARDIOVERSION N/A 09/08/2018   Procedure: TRANSESOPHAGEAL ECHOCARDIOGRAM (TEE);  Surgeon: Larey Dresser, MD;  Location: [facility] ENDOSCOPY;  Service: Cardiovascular;  Laterality: N/A;    There were no vitals filed for this visit.   Subjective Assessment - 04/27/21 1020    Subjective Pt wearing Foot up brace on RLE. Has just been walking with transfers at home so far.    Patient is  accompained by: --   [name] - girlfriend   Pertinent History history of hypertension, tobacco abuse, CAD with non-STEMI 04/2018 as well as left ICA occlusion July 2020 with associated CVA causing right side weakness and slurred speech. New watershed 3/21; Covid 9/21 - hospitalized 9-17- -09-04-20    Patient Stated Goals increase strength, be able to stand and walk as he was able to before Covid    Pain Onset More than a month ago                             [facility] Adult PT Treatment/Exercise - 04/27/21 1020      Transfers   Transfers Sit to Stand;Stand to Sit    Sit to Stand 4: Min assist;3: Mod assist;4: Min guard    Sit to Stand Details Verbal cues for technique    Sit to Stand Details (indicate cue type and reason) Verbal cues to lean forward when rising. Made sure right foot was back enough prior to rising. Min/mod assist from w/c at [facility]. CGA pulling on // bars from chair.    Stand to Sit 4: Min guard;4: Min assist      Ambulation/Gait   Ambulation/Gait Yes    Ambulation/Gait Assistance 4: Min guard;4: Min assist    Ambulation/Gait Assistance Details Verbal cues to try to increase  right foot clearance picking up more from hip. HR=110 and RPE=5/10    Ambulation Distance (Feet) 115 Feet    Assistive device Rolling walker   bariatric, right foot up brace   Gait Pattern Step-through pattern;Decreased step length - right;Decreased stance time - right;Decreased hip/knee flexion - right    Ambulation Surface Level;Indoor    Gait velocity 50.51 sec=0.59ms      Neuro Re-ed    Neuro Re-ed Details  In // bars: marching left leg x 5 with tactile cues at right knee. Tapping 6" step with LLE 10 x 2 with seated breaks in between with tactile cues to tighten right quad and min assist to keep right pelvis from rotating backward.      Knee/Hip Exercises: Aerobic   Recumbent Bike SciFit Level 2.0x 4" with UE's and LE's (arms at #10); performed from wheelchair. Performed for  strengthening and improving activity tolerance. HR=84 after                    PT Short Term Goals - 04/24/21 1851      PT SHORT TERM GOAL #1   Title Pt will be independent with initial HEP for strengthening and stretching.    Baseline HEP to be established    Time 4    Period Weeks    Status Achieved    Target Date 04/21/21      PT SHORT TERM GOAL #2   Title Pt will perform squat pivot transfers to level surface with min assist.    Baseline mod assist for squat pivot transfers; met 04-24-21    Time 4    Period Weeks    Status Achieved    Target Date 04/21/20      PT SHORT TERM GOAL #3   Title Pt will be able to perform sit to stand transfer from wheelchair to RW with min assist +1.    Baseline mod assist +2 from mat to RW; min to mod assist +1 on 04-24-21    Time 4    Period Weeks    Status Partially Met    Target Date 04/21/20      PT SHORT TERM GOAL #4   Title Pt will ambulate 550 with RW with +1 mod assist for improved household mobility.    Baseline 20' with RW with mod assist (2nd person present for safety). 04/17/21 min assist up to 115' varying throughout treatment on distance as fatigues.    Time 4    Period Weeks    Status Achieved    Target Date 04/21/21      PT SHORT TERM GOAL #5   Title Assess need for Rt AFO and obtain orthotic consult if needed.    Baseline Assessment for AFO to be completed; AFO not needed - Foot up brace on RLE beneficial - 04-20-21    Time 4    Period Weeks    Status Achieved    Target Date 04/21/21             PT Long Term Goals - 04/24/21 1856      PT LONG TERM GOAL #1   Title Pt will perform sit to stand transfers with +1 min assist.    Baseline max assist    Time 8    Period Weeks    Status New      PT LONG TERM GOAL #2   Title Pt will amb. with RW 100' with +1 min assist for household mobility.  Baseline 20' with mod assist with RW    Time 8    Period Weeks    Status New      PT LONG TERM GOAL #3   Title  Pt will be able to ambulate >100' CGA with RW for further improvement in household mobility.    Baseline 20' mod assist with RW    Time 8    Period Weeks    Status New      PT LONG TERM GOAL #4   Title Pt will be able to maintain standing x 5 min with supervision with light UE support on walker or counter for improved balance and participation with ADLs.    Baseline Bil. UE support on counter for 1" with min assist    Time 8    Period Weeks    Status New      PT LONG TERM GOAL #5   Title Caregiver will assist pt in performing updated HEP to assist with functional strengthening and stretching RLE.    Baseline Dependent    Time 8    Period Weeks    Status New                 Plan - 04/27/21 1721    Clinical Impression Statement Pt able to demonstrate more reciprocal pattern with gait. Does continue to fatigue quickly with standing activities. He is showing improved weight shift on right.    Personal Factors and Comorbidities Comorbidity 3+;Fitness;Time since onset of injury/illness/exacerbation    Comorbidities hypertension, tobacco abuse, CAD with non-STEMI 04/2018 as well as left ICA occlusion July 2020    Examination-Activity Limitations Bathing;Hygiene/Grooming;Stairs;Bed Mobility;Locomotion Level;Stand;Toileting;Transfers    Examination-Participation Restrictions Community Activity;Cleaning;Meal Prep;Driving;Shop;Laundry    Stability/Clinical Decision Making Evolving/Moderate complexity    Rehab Potential Good    PT Frequency 2x / week    PT Duration 8 weeks   10 weeks plus eval   PT Treatment/Interventions ADLs/Self Care Home Management;Moist Heat;Electrical Stimulation;Gait training;DME Instruction;Functional mobility training;Stair training;Neuromuscular re-education;Balance training;Therapeutic exercise;Therapeutic activities;Patient/family education;Orthotic Fit/Training;Vestibular;Passive range of motion;Manual techniques;Wheelchair mobility training    PT Next Visit  Plan [place] RLE strengthening, standing balance, transfers and gait training with bariatric RW.    Consulted and Agree with Plan of Care Patient;Other (Comment)   girlfriend - [name]          Patient will benefit from skilled therapeutic intervention in order to improve the following deficits and impairments:  Abnormal gait,Decreased activity tolerance,Decreased balance,Decreased endurance,Decreased mobility,Decreased knowledge of use of DME,Decreased range of motion,Decreased strength,Impaired UE functional use,Impaired tone  Visit Diagnosis: Other abnormalities of gait and mobility  Muscle weakness (generalized)     Problem List Patient Active Problem List   Diagnosis Date Noted  . CKD (chronic kidney disease) stage 2, GFR 60-89 ml/min 10/18/2020  . COVID-19   . Acute renal failure superimposed on stage 3a chronic kidney disease ([place])   . Pneumonia due to COVID-19 virus 09/02/2020  . Acute ischemic left middle cerebral artery (MCA) stroke ([place]) 05/11/2020  . Cerebral infarction, watershed distribution, unilateral, acute ([place]) 05/11/2020  . Dysarthria as late effect of cerebrovascular accident (CVA) 05/11/2020  . Visual field constriction, bilateral 05/11/2020  . Hemiparesis affecting right side as late effect of cerebrovascular accident ([place]) 05/11/2020  . Dysarthria, post-stroke 04/07/2020  . 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  . Hyperglycemia   . Anemia   . Acute pain of right knee   . Primary osteoarthritis of right knee   . Expressive language impairment   .  Benign essential HTN   . Acute bilat watershed infarction [facility]) 03/16/2020  . Cerebral infarction, watershed distribution, unilateral, chronic 03/16/2020  . Acute renal failure ([place])   . History of CVA in adulthood   . Dyslipidemia   . CVA (cerebral vascular accident) ([place]) 03/11/2020  . Leukocytosis   . Hemiparesis affecting dominant side as late effect of stroke ([place])   . Labile blood pressure   . Sleep  disturbance   . Benign hypertensive heart and kidney disease with diastolic CHF, NYHA class II and CKD stage III ([place])   . Chronic systolic congestive heart failure ([place])   . Morbid obesity ([place])   . Uncontrolled hypertension   . Dysphagia, post-stroke   . Cardiomegaly   . Acute systolic congestive heart failure ([place])   . Left middle cerebral artery stroke ([place]) 07/17/2019  . Cerebral embolism with cerebral infarction 07/11/2019  . Acute CVA (cerebrovascular accident) ([place]) 07/11/2019  . Slurred speech 07/10/2019  . Cardiomyopathy ([place]) 11/20/2018  . History of stroke 09/06/2018  . Tobacco use 09/06/2018  . History of non-ST elevation myocardial infarction (NSTEMI) 05/05/2018  . Severe Vitamin D deficiency 04/02/2018  . Community acquired pneumonia of left lower lobe of lung 04/02/2018  . CKD (chronic kidney disease), stage III ([place]) = Secondary to Hypertension 04/02/2018  . Pneumonia 03/30/2018  . Metabolic acidosis 21/02/1280  . AKI (acute kidney injury) ([place]) 03/30/2018  . N&V (nausea and vomiting) 03/30/2018  . Essential hypertension 03/30/2018    Electa Sniff, PT, DPT, NCS 04/27/2021, 5:25 PM  [facility] [address] [place], [place], [address] Phone: [phone]   Fax:  [phone]  Name: ARAD BURSTON MRN: [id number] Date of Birth: 1971/04/09

## 2021-05-01 ENCOUNTER — Other Ambulatory Visit: Payer: Self-pay

## 2021-05-01 ENCOUNTER — Ambulatory Visit: Payer: Medicaid Other | Admitting: Physical Therapy

## 2021-05-01 DIAGNOSIS — I69353 Hemiplegia and hemiparesis following cerebral infarction affecting right non-dominant side: Secondary | ICD-10-CM | POA: Diagnosis not present

## 2021-05-01 DIAGNOSIS — R2689 Other abnormalities of gait and mobility: Secondary | ICD-10-CM

## 2021-05-01 DIAGNOSIS — M6281 Muscle weakness (generalized): Secondary | ICD-10-CM

## 2021-05-01 DIAGNOSIS — R2681 Unsteadiness on feet: Secondary | ICD-10-CM | POA: Diagnosis not present

## 2021-05-02 ENCOUNTER — Other Ambulatory Visit: Payer: Self-pay | Admitting: *Deleted

## 2021-05-02 NOTE — Patient Outreach (Signed)
Medicaid Managed Care   Nurse Care Manager Note  05/02/2021 Name:  Zachary Mccann MRN:  [id number] DOB:  [birth year]  Zachary Mccann is an 50 y.o. year old male who is a primary patient of Wilson Singer, MD.  The Medicaid Managed Care Coordination team was consulted for assistance with:    HTN Hx stroke  Zachary Mccann was given information about Medicaid Managed Care Coordination team services today. Zachary Mccann agreed to services and verbal consent obtained.  Engaged with patient by telephone for follow up visit in response to provider referral for case management and/or care coordination services.   Assessments/Interventions:  Review of past medical history, allergies, medications, health status, including review of consultants reports, laboratory and other test data, was performed as part of comprehensive evaluation and provision of chronic care management services.  SDOH (Social Determinants of Health) assessments and interventions performed:   Care Plan  Allergies  Allergen Reactions  . Aspirin Swelling    Medications Reviewed Today    Reviewed by Ronn Melena, PT (Physical Therapist) on 04/27/21 at 1019  Med List Status: <None>  Medication Order Taking? Sig Documenting Provider Last Dose Status Informant  acetaminophen (TYLENOL) 325 MG tablet [id number] No Take 2 tablets (650 mg total) by mouth every 6 (six) hours as needed for mild pain (or Fever >/= 101). Charlton Amor, PA-C Taking Active Spouse/Significant Other           Med Note [place] Ouch, AMBER C   Fri Sep 02, 2020  4:51 PM)    albuterol (VENTOLIN HFA) 108 (90 Base) MCG/ACT inhaler [id number] No Inhale 2 puffs into the lungs every 6 (six) hours as needed for wheezing or shortness of breath. Erick Blinks, MD Taking Active            Med Note (Daren Yeagle A   Thu Mar 23, 2021  9:16 AM) Patient was unaware  amLODipine (NORVASC) 10 MG tablet [id number]  Take 1 tablet (10 mg total) by mouth daily. Wilson Singer, MD  Active   atorvastatin (LIPITOR) 40 MG tablet [id number]  TAKE 1 TABLET BY MOUTH ONCE DAILY AT  6  PM Gosrani, Nimish C, MD  Active   carvedilol (COREG) 6.25 MG tablet [id number]  Take 1 tablet (6.25 mg total) by mouth 2 (two) times daily with a meal. Gosrani, Nimish C, MD  Active   Cholecalciferol (VITAMIN D-3) 125 MCG (5000 UT) TABS [id number]  Take 1 tablet by mouth daily. Wilson Singer, MD  Active   ciprofloxacin (CIPRO) 500 MG tablet [id number] No Take 1 tablet (500 mg total) by mouth 2 (two) times daily. For 7 days  Patient not taking: Reported on 04/05/2021   [name], NP Not Taking Active            Med Note (Briasia Flinders A   Thu Mar 23, 2021  9:19 AM) completed  clopidogrel (PLAVIX) 75 MG tablet [id number]  Take 1 tablet (75 mg total) by mouth daily. Wilson Singer, MD  Active   guaiFENesin (MUCINEX) 600 MG 12 hr tablet [id number] No Take 1 tablet (600 mg total) by mouth 2 (two) times daily. Erick Blinks, MD Taking Active   hydrALAZINE (APRESOLINE) 25 MG tablet [id number]  Take 1 tablet (25 mg total) by mouth 3 (three) times daily. Wilson Singer, MD  Active   Melatonin 10 MG TABS [id number] No Take 10 mg by mouth at bedtime. Charlton Amor, PA-C Taking Active Spouse/Significant  Other           Med Note [place] Ouch, AMBER C   Fri Sep 02, 2020  4:51 PM)    nystatin (MYCOSTATIN/NYSTOP) powder [id number] No Apply 1 application topically 3 (three) times daily. [name], NP Taking Active   senna-docusate (SENOKOT-S) 8.6-50 MG tablet [id number] No Take 2 tablets by mouth at bedtime. [name], NP Taking Active            Med Note [place] Ouch, AMBER C   Fri Sep 02, 2020  4:52 PM)    sildenafil (VIAGRA) 100 MG tablet [id number] No Take 0.5-1 tablets (50-100 mg total) by mouth daily as needed for erectile dysfunction. [name], NP Taking Active           Patient Active Problem List   Diagnosis Date Noted  . CKD (chronic kidney disease) stage 2, GFR 60-89  ml/min 10/18/2020  . COVID-19   . Acute renal failure superimposed on stage 3a chronic kidney disease ([facility])   . Pneumonia due to COVID-19 virus 09/02/2020  . Acute ischemic left middle cerebral artery (MCA) stroke ([facility]) 05/11/2020  . Cerebral infarction, watershed distribution, unilateral, acute (H[facility]) 05/11/2020  . Dysarthria as late effect of cerebrovascular accident (CVA) 05/11/2020  . Visual field constriction, bilateral 05/11/2020  . Hemiparesis affecting right side as late effect of cerebrovascular accident ([facility]) 05/11/2020  . Dysarthria, post-stroke 04/07/2020  . 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  . Hyperglycemia   . Anemia   . Acute pain of right knee   . Primary osteoarthritis of right knee   . Expressive language impairment   . Benign essential HTN   . Acute bilat watershed infarction [facility]) 03/16/2020  . Cerebral infarction, watershed distribution, unilateral, chronic 03/16/2020  . Acute renal failure (H[facility])   . History of CVA in adulthood   . Dyslipidemia   . CVA (cerebral vascular accident) ([facility]) 03/11/2020  . Leukocytosis   . Hemiparesis affecting dominant side as late effect of stroke (H[facility])   . Labile blood pressure   . Sleep disturbance   . Benign hypertensive heart and kidney disease with diastolic CHF, NYHA class II and CKD stage III ([facility])   . Chronic systolic congestive heart failure ([facility])   . Morbid obesity (H[facility])   . Uncontrolled hypertension   . Dysphagia, post-stroke   . Cardiomegaly   . Acute systolic congestive heart failure ([facility])   . 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 Cerebral embolism with cerebral infarction 07/11/2019  . Acute CVA (cerebrovascular accident) ([facility]) 07/11/2019  . Slurred speech 07/10/2019  . Cardiomyopathy ([facility]) 11/20/2018  . History of stroke 09/06/2018  . Tobacco use 09/06/2018  . History of non-ST elevation myocardial infarction (NSTEMI) 05/05/2018  . Severe Vitamin D deficiency 04/02/2018  . Community acquired  pneumonia of left lower lobe of lung 04/02/2018  . CKD (chronic kidney disease), stage III ([facility]) = Secondary to Hypertension 04/02/2018  . Pneumonia 03/30/2018  . Metabolic acidosis 03/30/2018  . AKI (acute kidney injury) ([facility]) 03/30/2018  . N&V (nausea and vomiting) 03/30/2018  . Essential hypertension 03/30/2018    Conditions to be addressed/monitored per PCP order:  HTN and stroke history  Care Plan : Stroke (Adult)  Updates made by Heidi Dach, RN since 05/02/2021 12:00 AM    Problem: Recurrence (Stroke)     Long-Range Goal: Stroke Recurrence Prevented or Minimized   Start Date: 03/23/2021  Expected End Date: 07/03/2021  Recent Progress: On track  Priority: High  Note:   Current Barriers:   Ineffective Self Health Maintenance-Zachary Mccann has a history of 2 strokes (2019 and 2021). He is managing his health with assistance of [name], his significant other. He recently restarted PT and has been referred for a sleep study. His next PCP appointment is 04/12/21. He has a wheelchair that is in need of repair(missing leg rest, screws and the seat is falling apart). He is trying to improve his health and avoid another stroke.-Update-Zachary Mccann missed his PCP appointment due to family illness. He was able to receive a new wheelchair, which works much better than his old chair.  Unable to perform ADLs independently  Unable to perform IADLs independently  Currently UNABLE TO independently self manage needs related to chronic health conditions.   Knowledge Deficits related to short term plan for care coordination needs and long term plans for chronic disease management needs Nurse Case Manager Clinical Goal(s):   patient will work with care management team to address care coordination and chronic disease management needs related to Disease Management  Care Coordination   Interventions:   Evaluation of current treatment plan related to stroke history and patient's adherence to plan  as established by provider.  Provided education to patient re: heart healthy diet-will resend by mail  Reviewed medications with patient and discussed the benefits of consult with [facility] pharmacist  Collaborated with DME supplier ([employer] [phone]) regarding wheelchair needs-Spoke with Star at [employer], an order was placed for a technician to evaluate the wheelchair for repair vs replacement. They will call on 03/24/21 to arrange evaluation-Update-Zachary Mccann received a new wheelchair  Discussed plans with patient for ongoing care management follow up and provided patient with direct contact information for care management team  Reviewed scheduled/upcoming provider appointments including: calling to reschedule missed PCP appointment [phone], 6/8 with Nephrology  Provided patient with the contact information for medical transportation provided by [facility] [phone] Self Care Activities:  . Patient will self administer medications as prescribed . Patient will attend all scheduled provider appointments . Patient will call pharmacy for medication refills . Patient will call provider office for new concerns or questions Patient Goals: -- attend 90 percent of physical therapy appointments - eat healthy to increase strength - increase activity or exercise time a little every week  - call to reschedule missed appointment with PCP [phone] - call to cancel if needed - keep a calendar with prescription refill dates - keep a calendar with appointment dates  - call [phone], 2-3 days before appointment for medical transportation provided by [facility] - follow up with sleep study referral-discuss with Dr. Karilyn Cota at next appointment Follow Up Plan: Telephone follow up appointment with care management team member scheduled for:07/03/21 @ 1pm       Follow Up:  Patient agrees to Care Plan and Follow-up.  Plan: The Managed Medicaid care management team will reach out to the patient  again over the next 30 days.  Date/time of next scheduled RN care management/care coordination outreach:  07/03/21 @ 1pm  [name] RN, BSN [place]  [employer]

## 2021-05-02 NOTE — Therapy (Signed)
[facility] [address] [place], [place], [address] Phone: [phone]   Fax:  [phone]  Physical Therapy Treatment  Patient Details  Name: Zachary Mccann MRN: [id number] Date of Birth: 08-12-1971 Referring Provider (PT): Frann Rider, NP   Encounter Date: 05/01/2021   PT End of Session - 05/02/21 1808    Visit Number 10    Number of Visits 17    Date for PT Re-Evaluation 05/19/21    Authorization Type Medicaid 4/8-6/20 16 visits    Authorization - Visit Number 9    Authorization - Number of Visits 16    PT Start Time 1020    PT Stop Time 1055   pt requested to end session early due to dyspnea with having to wear mask and not feeling well past 2 days   PT Time Calculation (min) 35 min    Equipment Utilized During Treatment Gait belt    Activity Tolerance Patient tolerated treatment well    Behavior During Therapy [facility] for tasks assessed/performed           Past Medical History:  Diagnosis Date  . CAD (coronary artery disease)    a. s/p NSTEMI in 04/2018 with DES to LCx.   [name] Hypertension   . Myocardial infarction ([place])   . Pneumonia   . Stroke [facility])     Past Surgical History:  Procedure Laterality Date  . CORONARY STENT INTERVENTION N/A 05/05/2018   Procedure: CORONARY STENT INTERVENTION;  Surgeon: Leonie Man, MD;  Location: [facility];  Service: Cardiovascular;  Laterality: N/A;  . LEFT HEART CATH AND CORONARY ANGIOGRAPHY N/A 05/05/2018   Procedure: LEFT HEART CATH AND CORONARY ANGIOGRAPHY;  Surgeon: Leonie Man, MD;  Location: [facility];  Service: Cardiovascular;  Laterality: N/A;  . TEE WITHOUT CARDIOVERSION N/A 09/08/2018   Procedure: TRANSESOPHAGEAL ECHOCARDIOGRAM (TEE);  Surgeon: Larey Dresser, MD;  Location: [facility] ENDOSCOPY;  Service: Cardiovascular;  Laterality: N/A;    There were no vitals filed for this visit.   Subjective Assessment - 05/02/21 1800    Subjective  Pt's girlfriend has had trouble breathing at night (has been congested) and has not been able to sleep well - pt not feeling as well today as he did at previous PT session last Thursday    Patient is accompained by: --   [name] - girlfriend   Pertinent History history of hypertension, tobacco abuse, CAD with non-STEMI 04/2018 as well as left ICA occlusion July 2020 with associated CVA causing right side weakness and slurred speech. New watershed 3/21; Covid 9/21 - hospitalized 9-17- -09-04-20    Patient Stated Goals increase strength, be able to stand and walk as he was able to before Covid    Currently in Pain? Yes    Pain Score 5     Pain Location Hip    Pain Orientation Right    Pain Descriptors / Indicators Aching;Discomfort;Dull    Pain Type Neuropathic pain    Pain Onset More than a month ago    Pain Frequency Intermittent                             [facility] Adult PT Treatment/Exercise - 05/02/21 0001      Transfers   Transfers Sit to Stand;Stand to Sit    Sit to Stand 3: Mod assist    Sit to Stand Details Verbal cues for technique;Tactile cues for  weight shifting;Tactile cues for placement    Sit to Stand Details (indicate cue type and reason) from 19" mat surface (lower mat table); 2nd person needed on 3rd rep due to increased difficulty    Stand to Sit 3: Mod assist    Stand to Sit Details cues to reach back to control descent    Number of Reps Other reps (comment)   3 reps performed     Ambulation/Gait   Ambulation/Gait Yes    Ambulation/Gait Assistance 4: Min assist    Ambulation/Gait Assistance Details foot up brace on RLE    Ambulation Distance (Feet) 38 Feet   35' on 2nd rep - pt declined additional rep due to SOB and difficulty with mask on   Assistive device Rolling walker   bariatric, right foot up brace   Gait Pattern Step-through pattern;Decreased step length - right;Decreased stance time - right;Decreased hip/knee flexion - right    Ambulation  Surface Level;Indoor      Exercises   Exercises Knee/Hip      Knee/Hip Exercises: Aerobic   Recumbent Bike SciFit Level 4.0x 5" with UE's and LE's (arms at #10); performed from wheelchair                    PT Short Term Goals - 05/02/21 1813      PT SHORT TERM GOAL #1   Title Pt will be independent with initial HEP for strengthening and stretching.    Baseline HEP to be established    Time 4    Period Weeks    Status Achieved    Target Date 04/21/21      PT SHORT TERM GOAL #2   Title Pt will perform squat pivot transfers to level surface with min assist.    Baseline mod assist for squat pivot transfers; met 04-24-21    Time 4    Period Weeks    Status Achieved    Target Date 04/21/20      PT SHORT TERM GOAL #3   Title Pt will be able to perform sit to stand transfer from wheelchair to RW with min assist +1.    Baseline mod assist +2 from mat to RW; min to mod assist +1 on 04-24-21    Time 4    Period Weeks    Status Partially Met    Target Date 04/21/20      PT SHORT TERM GOAL #4   Title Pt will ambulate 65' with RW with +1 mod assist for improved household mobility.    Baseline 20' with RW with mod assist (2nd person present for safety). 04/17/21 min assist up to 115' varying throughout treatment on distance as fatigues.    Time 4    Period Weeks    Status Achieved    Target Date 04/21/21      PT SHORT TERM GOAL #5   Title Assess need for Rt AFO and obtain orthotic consult if needed.    Baseline Assessment for AFO to be completed; AFO not needed - Foot up brace on RLE beneficial - 04-20-21    Time 4    Period Weeks    Status Achieved    Target Date 04/21/21             PT Long Term Goals - 05/02/21 1814      PT LONG TERM GOAL #1   Title Pt will perform sit to stand transfers with +1 min assist.    Baseline max assist  Time 8    Period Weeks    Status New    Target Date 05/19/21      PT LONG TERM GOAL #2   Title Pt will amb. with RW 100' with  +1 min assist for household mobility.    Baseline 20' with mod assist with RW    Time 8    Period Weeks    Status New      PT LONG TERM GOAL #3   Title Pt will be able to ambulate >100' CGA with RW for further improvement in household mobility.    Baseline 20' mod assist with RW    Time 8    Period Weeks    Status New      PT LONG TERM GOAL #4   Title Pt will be able to maintain standing x 5 min with supervision with light UE support on walker or counter for improved balance and participation with ADLs.    Baseline Bil. UE support on counter for 1" with min assist    Time 8    Period Weeks    Status New      PT LONG TERM GOAL #5   Title Caregiver will assist pt in performing updated HEP to assist with functional strengthening and stretching RLE.    Baseline Dependent    Time 8    Period Weeks    Status New                 Plan - 05/02/21 1810    Clinical Impression Statement Pt had decreased endurance in today's session with c/o dyspnea with mask; girlfriend reported pt did not sleep well last night due to difficulty breathing and having congestion which started last Friday.  Pt only tolerated 2 reps gait training with distances 58' & 35';  pt declined 3rd attempt, reporting fatigue and dyspnea.  Cont with POC.    Personal Factors and Comorbidities Comorbidity 3+;Fitness;Time since onset of injury/illness/exacerbation    Comorbidities hypertension, tobacco abuse, CAD with non-STEMI 04/2018 as well as left ICA occlusion July 2020    Examination-Activity Limitations Bathing;Hygiene/Grooming;Stairs;Bed Mobility;Locomotion Level;Stand;Toileting;Transfers    Examination-Participation Restrictions Community Activity;Cleaning;Meal Prep;Driving;Shop;Laundry    Stability/Clinical Decision Making Evolving/Moderate complexity    Rehab Potential Good    PT Frequency 2x / week    PT Duration 8 weeks   10 weeks plus eval   PT Treatment/Interventions ADLs/Self Care Home Management;Moist  Heat;Electrical Stimulation;Gait training;DME Instruction;Functional mobility training;Stair training;Neuromuscular re-education;Balance training;Therapeutic exercise;Therapeutic activities;Patient/family education;Orthotic Fit/Training;Vestibular;Passive range of motion;Manual techniques;Wheelchair mobility training    PT Next Visit Plan [place] RLE strengthening, standing balance, transfers and gait training with bariatric RW.    Consulted and Agree with Plan of Care Patient;Other (Comment)   girlfriend - [name]          Patient will benefit from skilled therapeutic intervention in order to improve the following deficits and impairments:  Abnormal gait,Decreased activity tolerance,Decreased balance,Decreased endurance,Decreased mobility,Decreased knowledge of use of DME,Decreased range of motion,Decreased strength,Impaired UE functional use,Impaired tone  Visit Diagnosis: Other abnormalities of gait and mobility  Muscle weakness (generalized)     Problem List Patient Active Problem List   Diagnosis Date Noted  . CKD (chronic kidney disease) stage 2, GFR 60-89 ml/min 10/18/2020  . COVID-19   . Acute renal failure superimposed on stage 3a chronic kidney disease ([place])   . Pneumonia due to COVID-19 virus 09/02/2020  . Acute ischemic left middle cerebral artery (MCA) stroke ([place]) 05/11/2020  . Cerebral infarction,  watershed distribution, unilateral, acute ([place]) 05/11/2020  . Dysarthria as late effect of cerebrovascular accident (CVA) 05/11/2020  . Visual field constriction, bilateral 05/11/2020  . Hemiparesis affecting right side as late effect of cerebrovascular accident ([place]) 05/11/2020  . Dysarthria, post-stroke 04/07/2020  . 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  . Hyperglycemia   . Anemia   . Acute pain of right knee   . Primary osteoarthritis of right knee   . Expressive language impairment   . Benign essential HTN   . Acute bilat watershed infarction [facility]) 03/16/2020  .  Cerebral infarction, watershed distribution, unilateral, chronic 03/16/2020  . Acute renal failure ([place])   . History of CVA in adulthood   . Dyslipidemia   . CVA (cerebral vascular accident) ([place]) 03/11/2020  . Leukocytosis   . Hemiparesis affecting dominant side as late effect of stroke ([place])   . Labile blood pressure   . Sleep disturbance   . Benign hypertensive heart and kidney disease with diastolic CHF, NYHA class II and CKD stage III ([place])   . Chronic systolic congestive heart failure ([place])   . Morbid obesity ([place])   . Uncontrolled hypertension   . Dysphagia, post-stroke   . Cardiomegaly   . Acute systolic congestive heart failure ([place])   . Left middle cerebral artery stroke ([place]) 07/17/2019  . Cerebral embolism with cerebral infarction 07/11/2019  . Acute CVA (cerebrovascular accident) ([place]) 07/11/2019  . Slurred speech 07/10/2019  . Cardiomyopathy ([place]) 11/20/2018  . History of stroke 09/06/2018  . Tobacco use 09/06/2018  . History of non-ST elevation myocardial infarction (NSTEMI) 05/05/2018  . Severe Vitamin D deficiency 04/02/2018  . Community acquired pneumonia of left lower lobe of lung 04/02/2018  . CKD (chronic kidney disease), stage III ([place]) = Secondary to Hypertension 04/02/2018  . Pneumonia 03/30/2018  . Metabolic acidosis 93/57/0177  . AKI (acute kidney injury) ([place]) 03/30/2018  . N&V (nausea and vomiting) 03/30/2018  . Essential hypertension 03/30/2018    [place], PT 05/02/2021, 6:16 PM  [facility] [address] [place], [place], [address] Phone: [phone]   Fax:  [phone]  Name: Zachary Mccann MRN: [id number] Date of Birth: December 06, 1971

## 2021-05-02 NOTE — Patient Instructions (Signed)
Visit Information  [name] was given information about Medicaid Managed Care team care coordination services as a part of [facility]. Zachary Mccann verbally consented to engagement with the [facility]Medicaid Managed Care team.   For questions related to your [facility] health [facility], please call: [phone]  If you would like to schedule transportation through your [facility] plan, please call the following number at least 2 days in advance of your appointment: [phone].  Call the Behavioral Health Crisis Line at [phone], at any time, 24 hours a day, 7 days a week. If you are in danger or need immediate medical attention call 911.  [name] - following are the goals we discussed in your visit today:  Goals Addressed            This Visit's Progress   . Keep or Improve My Strength-Stroke       Timeframe:  Long-Range Goal Priority:  High Start Date:   03/23/21                          Expected End Date:   07/03/21                    Follow Up Date 07/03/21   - attend 90 percent of physical therapy appointments - eat healthy to increase strength - increase activity or exercise time a little every week    Why is this important?    Before the stroke you probably did not think much about being safe when you are up and about.   Now, it may be harder for you to get around.   It may also be easier for you to trip or fall.   It is common to have muscle weakness after a stroke. You may also feel like you cannot control an arm or leg.   It will be helpful to work with a physical therapist to get your strength and muscle control back.   It is good to stay as active as you can. Walking and stretching help you stay strong and flexible.   The physical therapist will develop an exercise program just for you.         . Make and Keep All Appointments       Timeframe:  Long-Range Goal Priority:  High Start Date:   03/23/21                           Expected End Date: 07/03/21                      Follow Up Date 07/03/21    - call to reschedule missed appointment with PCP [phone] - call to cancel if needed - keep a calendar with prescription refill dates - keep a calendar with appointment dates  - call [phone], 2-3 days before appointment for medical transportation [facility] by [facility] - follow up with sleep study referral-discuss with Dr. Karilyn Mccann at next appointment   Why is this important?    Part of staying healthy is seeing the doctor for follow-up care.   If you forget your appointments, there are some things you can do to stay on track.           Please see education materials related to heart healthy diet provided as print materials.     https://www.mata.com/https://www.nhlbi.nih.gov/files/docs/public/heart/dash_brief.pdf">  DASH Eating Plan DASH stands for Dietary  Approaches to Stop Hypertension. The DASH eating plan is a healthy eating plan that has been shown to:  Reduce high blood pressure (hypertension).  Reduce your risk for type 2 diabetes, heart disease, and stroke.  Help with weight loss. What are tips for following this plan? Reading food labels  Check food labels for the amount of salt (sodium) per serving. Choose foods with less than 5 percent of the Daily Value of sodium. Generally, foods with less than 300 milligrams (mg) of sodium per serving fit into this eating plan.  To find whole grains, look for the word "whole" as the first word in the ingredient list. Shopping  Buy products labeled as "low-sodium" or "no salt added."  Buy fresh foods. Avoid canned foods and pre-made or frozen meals. Cooking  Avoid adding salt when cooking. Use salt-free seasonings or herbs instead of table salt or sea salt. Check with your health care provider or pharmacist before using salt substitutes.  Do not fry foods. Cook foods using healthy methods such as baking, boiling, grilling, roasting, and broiling  instead.  Cook with heart-healthy oils, such as olive, canola, avocado, soybean, or sunflower oil. Meal planning  Eat a balanced diet that includes: ? 4 or more servings of fruits and 4 or more servings of vegetables each day. Try to fill one-half of your plate with fruits and vegetables. ? 6-8 servings of whole grains each day. ? Less than 6 oz (170 g) of lean meat, poultry, or fish each day. A 3-oz (85-g) serving of meat is about the same size as a deck of cards. One egg equals 1 oz (28 g). ? 2-3 servings of low-fat dairy each day. One serving is 1 cup (237 mL). ? 1 serving of nuts, seeds, or beans 5 times each week. ? 2-3 servings of heart-healthy fats. Healthy fats called omega-3 fatty acids are found in foods such as walnuts, flaxseeds, fortified milks, and eggs. These fats are also found in cold-water fish, such as sardines, salmon, and mackerel.  Limit how much you eat of: ? Canned or prepackaged foods. ? Food that is high in trans fat, such as some fried foods. ? Food that is high in saturated fat, such as fatty meat. ? Desserts and other sweets, sugary drinks, and other foods with added sugar. ? Full-fat dairy products.  Do not salt foods before eating.  Do not eat more than 4 egg yolks a week.  Try to eat at least 2 vegetarian meals a week.  Eat more home-cooked food and less restaurant, buffet, and fast food.   Lifestyle  When eating at a restaurant, ask that your food be prepared with less salt or no salt, if possible.  If you drink alcohol: ? Limit how much you use to:  0-1 drink a day for women who are not pregnant.  0-2 drinks a day for men. ? Be aware of how much alcohol is in your drink. In the [place] one drink equals one 12 oz bottle of beer (355 mL), one 5 oz glass of wine (148 mL), or one 1 oz glass of hard liquor (44 mL). General information  Avoid eating more than 2,300 mg of salt a day. If you have hypertension, you may need to reduce your sodium intake  to 1,500 mg a day.  Work with your health care provider to maintain a healthy body weight or to lose weight. Ask what an ideal weight is for you.  Get at least 30 minutes of  exercise that causes your heart to beat faster (aerobic exercise) most days of the week. Activities may include walking, swimming, or biking.  Work with your health care provider or dietitian to adjust your eating plan to your individual calorie needs. What foods should I eat? Fruits All fresh, dried, or frozen fruit. Canned fruit in natural juice (without added sugar). Vegetables Fresh or frozen vegetables (raw, steamed, roasted, or grilled). Low-sodium or reduced-sodium tomato and vegetable juice. Low-sodium or reduced-sodium tomato sauce and tomato paste. Low-sodium or reduced-sodium canned vegetables. Grains Whole-grain or whole-wheat bread. Whole-grain or whole-wheat pasta. Brown rice. Zachary Mccann. Bulgur. Whole-grain and low-sodium cereals. Pita bread. Low-fat, low-sodium crackers. Whole-wheat flour tortillas. Meats and other proteins Skinless chicken or [place]. Ground chicken or [place]. Pork with fat trimmed off. Fish and seafood. Egg whites. Dried beans, peas, or lentils. Unsalted nuts, nut butters, and seeds. Unsalted canned beans. Lean cuts of beef with fat trimmed off. Low-sodium, lean precooked or cured meat, such as sausages or meat loaves. Dairy Low-fat (1%) or fat-free (skim) milk. Reduced-fat, low-fat, or fat-free cheeses. Nonfat, low-sodium ricotta or cottage cheese. Low-fat or nonfat yogurt. Low-fat, low-sodium cheese. Fats and oils Soft margarine without trans fats. Vegetable oil. Reduced-fat, low-fat, or light mayonnaise and salad dressings (reduced-sodium). Canola, safflower, olive, avocado, soybean, and sunflower oils. Avocado. Seasonings and condiments Herbs. Spices. Seasoning mixes without salt. Other foods Unsalted popcorn and pretzels. Fat-free sweets. The items listed above may not be a  complete list of foods and beverages you can eat. Contact a dietitian for more information. What foods should I avoid? Fruits Canned fruit in a light or heavy syrup. Fried fruit. Fruit in cream or butter sauce. Vegetables Creamed or fried vegetables. Vegetables in a cheese sauce. Regular canned vegetables (not low-sodium or reduced-sodium). Regular canned tomato sauce and paste (not low-sodium or reduced-sodium). Regular tomato and vegetable juice (not low-sodium or reduced-sodium). Zachary Mccann. Olives. Grains Baked goods made with fat, such as croissants, muffins, or some breads. Dry pasta or rice meal packs. Meats and other proteins Fatty cuts of meat. Ribs. Fried meat. Zachary Mccann. Bologna, salami, and other precooked or cured meats, such as sausages or meat loaves. Fat from the back of a pig (fatback). Bratwurst. Salted nuts and seeds. Canned beans with added salt. Canned or smoked fish. Whole eggs or egg yolks. Chicken or [place] with skin. Dairy Whole or 2% milk, cream, and half-and-half. Whole or full-fat cream cheese. Whole-fat or sweetened yogurt. Full-fat cheese. Nondairy creamers. Whipped toppings. Processed cheese and cheese spreads. Fats and oils Butter. Stick margarine. Lard. Shortening. Ghee. Bacon fat. Tropical oils, such as coconut, palm kernel, or palm oil. Seasonings and condiments Onion salt, garlic salt, seasoned salt, table salt, and sea salt. Worcestershire sauce. Tartar sauce. Barbecue sauce. Teriyaki sauce. Soy sauce, including reduced-sodium. Steak sauce. Canned and packaged gravies. Fish sauce. Oyster sauce. Cocktail sauce. Store-bought horseradish. Ketchup. Mustard. Meat flavorings and tenderizers. Bouillon cubes. Hot sauces. Pre-made or packaged marinades. Pre-made or packaged taco seasonings. Relishes. Regular salad dressings. Other foods Salted popcorn and pretzels. The items listed above may not be a complete list of foods and beverages you should avoid. Contact a dietitian for  more information. Where to find more information  National Heart, Lung, and Blood Institute: PopSteam.is  American Heart Association: www.heart.org  Academy of Nutrition and Dietetics: www.eatright.org  National Kidney Foundation: www.kidney.org Summary  The DASH eating plan is a healthy eating plan that has been shown to reduce high blood pressure (hypertension). It may also  reduce your risk for type 2 diabetes, heart disease, and stroke.  When on the DASH eating plan, aim to eat more fresh fruits and vegetables, whole grains, lean proteins, low-fat dairy, and heart-healthy fats.  With the DASH eating plan, you should limit salt (sodium) intake to 2,300 mg a day. If you have hypertension, you may need to reduce your sodium intake to 1,500 mg a day.  Work with your health care provider or dietitian to adjust your eating plan to your individual calorie needs. This information is not intended to replace advice given to you by your health care provider. Make sure you discuss any questions you have with your health care provider. Document Revised: 11/06/2019 Document Reviewed: 11/06/2019 Elsevier Patient Education  2021 [employer].   The patient verbalized understanding of instructions provided today and agreed to receive a mailed copy of patient instruction and/or educational materials.  Telephone follow up appointment with Managed Medicaid care management team member scheduled for:07/03/21 @ 1pm  [name] RN, BSN [place]  [facility] RN Care Coordinator   Following is a copy of your plan of care:  Patient Care Plan: Stroke (Adult)    Problem Identified: Recurrence (Stroke)     Long-Range Goal: Stroke Recurrence Prevented or Minimized   Start Date: 03/23/2021  Expected End Date: 07/03/2021  Recent Progress: On track  Priority: High  Note:   Current Barriers:   Ineffective Self Health Maintenance-Mr. Mcaulay has a history of 2 strokes (2019 and  2021). He is managing his health with assistance of [name], his significant other. He recently restarted PT and has been referred for a sleep study. His next PCP appointment is 04/12/21. He has a wheelchair that is in need of repair(missing leg rest, screws and the seat is falling apart). He is trying to improve his health and avoid another stroke.-Update-[name] missed his PCP appointment due to family illness. He was able to receive a new wheelchair, which works much better than his old chair.  Unable to perform ADLs independently  Unable to perform IADLs independently  Currently UNABLE TO independently self manage needs related to chronic health conditions.   Knowledge Deficits related to short term plan for care coordination needs and long term plans for chronic disease management needs Nurse Case Manager Clinical Goal(s):   patient will work with care management team to address care coordination and chronic disease management needs related to Disease Management  Care Coordination   Interventions:   Evaluation of current treatment plan related to stroke history and patient's adherence to plan as established by provider.  Provided education to patient re: heart healthy diet-will resend by mail  Reviewed medications with patient and discussed the benefits of consult with [facility] pharmacist  Collaborated with DME supplier ([employer] [phone]) regarding wheelchair needs-Spoke with Star at [employer], an order was placed for a technician to evaluate the wheelchair for repair vs replacement. They will call on 03/24/21 to arrange evaluation-Update-[name] received a new wheelchair  Discussed plans with patient for ongoing care management follow up and provided patient with direct contact information for care management team  Reviewed scheduled/upcoming provider appointments including: calling to reschedule missed PCP appointment [phone], 6/8 with Nephrology  Provided patient with the  contact information for medical transportation provided by [facility] [phone] Self Care Activities:  . Patient will self administer medications as prescribed . Patient will attend all scheduled provider appointments . Patient will call pharmacy for medication refills . Patient will call provider office for  new concerns or questions Patient Goals: -- attend 90 percent of physical therapy appointments - eat healthy to increase strength - increase activity or exercise time a little every week  - call to reschedule missed appointment with PCP [phone] - call to cancel if needed - keep a calendar with prescription refill dates - keep a calendar with appointment dates  - call [phone], 2-3 days before appointment for medical transportation provided by [facility] - follow up with sleep study referral-discuss with Dr. Karilyn Cota at next appointment Follow Up Plan: Telephone follow up appointment with care management team member scheduled for:07/03/21 @ 1pm     Patient Care Plan: Medication Management    Problem Identified: Health Promotion or Disease Self-Management (General Plan of Care)     Goal: Medication Management   Note:   Current Barriers:  . Unable to self administer medications as prescribed . Does not maintain contact with provider office . Does not contact provider office for questions/concerns .   Pharmacist Clinical Goal(s):  [name] Over the next 30 days, patient will achieve adherence to monitoring guidelines and medication adherence to achieve therapeutic efficacy . contact provider office for questions/concerns as evidenced notation of same in electronic health record through collaboration with PharmD and provider.  .   Interventions: . Inter-disciplinary care team collaboration (see longitudinal plan of care) . Comprehensive medication review performed; medication list updated in electronic medical record  @RXCPHYPERTENSION @ @RXCPHYPERLIPIDEMIA @  Patient  Goals/Self-Care Activities . Over the next 30 days, patient will:  - take medications as prescribed collaborate with provider on medication access solutions  Follow Up Plan: The care management team will reach out to the patient again over the next 30 days.

## 2021-05-08 ENCOUNTER — Ambulatory Visit: Payer: Medicaid Other

## 2021-05-08 ENCOUNTER — Other Ambulatory Visit: Payer: Self-pay

## 2021-05-08 VITALS — HR 84

## 2021-05-08 DIAGNOSIS — R2689 Other abnormalities of gait and mobility: Secondary | ICD-10-CM | POA: Diagnosis not present

## 2021-05-08 DIAGNOSIS — R2681 Unsteadiness on feet: Secondary | ICD-10-CM | POA: Diagnosis not present

## 2021-05-08 DIAGNOSIS — I69353 Hemiplegia and hemiparesis following cerebral infarction affecting right non-dominant side: Secondary | ICD-10-CM

## 2021-05-08 DIAGNOSIS — M6281 Muscle weakness (generalized): Secondary | ICD-10-CM | POA: Diagnosis not present

## 2021-05-08 NOTE — Therapy (Signed)
[facility] [address] [place], [place], [address] Phone: [phone]   Fax:  [phone]  Physical Therapy Treatment  Patient Details  Name: Zachary Mccann MRN: [id number] Date of Birth: 21-Jul-1971 Referring Provider (PT): Frann Rider, NP   Encounter Date: 05/08/2021   PT End of Session - 05/08/21 1018    Visit Number 11    Number of Visits 17    Date for PT Re-Evaluation 05/19/21    Authorization Type Medicaid 4/8-6/20 16 visits    Authorization - Visit Number 10    Authorization - Number of Visits 16    PT Start Time 1016    PT Stop Time 1100    PT Time Calculation (min) 44 min    Equipment Utilized During Treatment Gait belt    Activity Tolerance Patient tolerated treatment well    Behavior During Therapy [facility] for tasks assessed/performed           Past Medical History:  Diagnosis Date  . CAD (coronary artery disease)    a. s/p NSTEMI in 04/2018 with DES to LCx.   [name] Hypertension   . Myocardial infarction ([place])   . Pneumonia   . Stroke [facility])     Past Surgical History:  Procedure Laterality Date  . CORONARY STENT INTERVENTION N/A 05/05/2018   Procedure: CORONARY STENT INTERVENTION;  Surgeon: Leonie Man, MD;  Location: [facility];  Service: Cardiovascular;  Laterality: N/A;  . LEFT HEART CATH AND CORONARY ANGIOGRAPHY N/A 05/05/2018   Procedure: LEFT HEART CATH AND CORONARY ANGIOGRAPHY;  Surgeon: Leonie Man, MD;  Location: [facility];  Service: Cardiovascular;  Laterality: N/A;  . TEE WITHOUT CARDIOVERSION N/A 09/08/2018   Procedure: TRANSESOPHAGEAL ECHOCARDIOGRAM (TEE);  Surgeon: Larey Dresser, MD;  Location: [facility] ENDOSCOPY;  Service: Cardiovascular;  Laterality: N/A;    Vitals:   05/08/21 1021  Pulse: 84  SpO2: 94%     Subjective Assessment - 05/08/21 1018    Subjective Pt's girlfriend reports he is still not feeling the best. He is still having congestion and was  coughing quite a bit. Did get some clear stuff up yesterday. Also reports that left leg was pretty swollen yesterday.    Patient is accompained by: --   [name] - girlfriend   Pertinent History history of hypertension, tobacco abuse, CAD with non-STEMI 04/2018 as well as left ICA occlusion July 2020 with associated CVA causing right side weakness and slurred speech. New watershed 3/21; Covid 9/21 - hospitalized 9-17- -09-04-20    Patient Stated Goals increase strength, be able to stand and walk as he was able to before Covid    Currently in Pain? No/denies    Pain Onset More than a month ago                             [facility] Adult PT Treatment/Exercise - 05/08/21 1026      Transfers   Transfers Sit to Stand;Stand to Sit    Sit to Stand 3: Mod assist    Sit to Stand Details Verbal cues for technique;Tactile cues for weight shifting    Sit to Stand Details (indicate cue type and reason) from w/c. Performed x 3 throughout session. Verbal cues to bring right foot back prior to rising.    Stand to Sit 4: Min assist      Ambulation/Gait   Ambulation/Gait Yes    Ambulation/Gait Assistance  4: Min guard    Ambulation/Gait Assistance Details O2 sat=97%, HR=98, RPE=5/10. During 2nd bout, PT provided tactile cues at right posterior pelvis to try to initiate more right hip extension during stance. RPE after 2nd bout 6/10.    Ambulation Distance (Feet) 115 Feet   55' x 1   Assistive device Rolling walker   bariatric RW and right foot up brace   Gait Pattern Step-through pattern;Decreased step length - right    Ambulation Surface Level;Indoor      Therapeutic Activites    Therapeutic Activities Other Therapeutic Activities    Other Therapeutic Activities PT listened to lung sounds which were clear except for one episode of wheezing in right lower lobe.      Neuro Re-ed    Neuro Re-ed Details  Pt stood x 20 sec without UE support                  PT Education - 05/08/21  1745    Education Details Pt was instructed to try to elevate legs when sitting to help with swelling throughout the day.    Person(s) Educated Patient    Methods Explanation    Comprehension Verbalized understanding            PT Short Term Goals - 05/02/21 1813      PT SHORT TERM GOAL #1   Title Pt will be independent with initial HEP for strengthening and stretching.    Baseline HEP to be established    Time 4    Period Weeks    Status Achieved    Target Date 04/21/21      PT SHORT TERM GOAL #2   Title Pt will perform squat pivot transfers to level surface with min assist.    Baseline mod assist for squat pivot transfers; met 04-24-21    Time 4    Period Weeks    Status Achieved    Target Date 04/21/20      PT SHORT TERM GOAL #3   Title Pt will be able to perform sit to stand transfer from wheelchair to RW with min assist +1.    Baseline mod assist +2 from mat to RW; min to mod assist +1 on 04-24-21    Time 4    Period Weeks    Status Partially Met    Target Date 04/21/20      PT SHORT TERM GOAL #4   Title Pt will ambulate 75' with RW with +1 mod assist for improved household mobility.    Baseline 20' with RW with mod assist (2nd person present for safety). 04/17/21 min assist up to 115' varying throughout treatment on distance as fatigues.    Time 4    Period Weeks    Status Achieved    Target Date 04/21/21      PT SHORT TERM GOAL #5   Title Assess need for Rt AFO and obtain orthotic consult if needed.    Baseline Assessment for AFO to be completed; AFO not needed - Foot up brace on RLE beneficial - 04-20-21    Time 4    Period Weeks    Status Achieved    Target Date 04/21/21             PT Long Term Goals - 05/02/21 1814      PT LONG TERM GOAL #1   Title Pt will perform sit to stand transfers with +1 min assist.    Baseline max assist  Time 8    Period Weeks    Status New    Target Date 05/19/21      PT LONG TERM GOAL #2   Title Pt will amb. with RW  100' with +1 min assist for household mobility.    Baseline 20' with mod assist with RW    Time 8    Period Weeks    Status New      PT LONG TERM GOAL #3   Title Pt will be able to ambulate >100' CGA with RW for further improvement in household mobility.    Baseline 20' mod assist with RW    Time 8    Period Weeks    Status New      PT LONG TERM GOAL #4   Title Pt will be able to maintain standing x 5 min with supervision with light UE support on walker or counter for improved balance and participation with ADLs.    Baseline Bil. UE support on counter for 1" with min assist    Time 8    Period Weeks    Status New      PT LONG TERM GOAL #5   Title Caregiver will assist pt in performing updated HEP to assist with functional strengthening and stretching RLE.    Baseline Dependent    Time 8    Period Weeks    Status New                 Plan - 05/08/21 1747    Clinical Impression Statement Pt was able to progress gait compared to last visit. O2 sats remained in mid to upper 90s throughout. Lungs were clear when assessed,    Personal Factors and Comorbidities Comorbidity 3+;Fitness;Time since onset of injury/illness/exacerbation    Comorbidities hypertension, tobacco abuse, CAD with non-STEMI 04/2018 as well as left ICA occlusion July 2020    Examination-Activity Limitations Bathing;Hygiene/Grooming;Stairs;Bed Mobility;Locomotion Level;Stand;Toileting;Transfers    Examination-Participation Restrictions Community Activity;Cleaning;Meal Prep;Driving;Shop;Laundry    Stability/Clinical Decision Making Evolving/Moderate complexity    Rehab Potential Good    PT Frequency 2x / week    PT Duration 8 weeks   10 weeks plus eval   PT Treatment/Interventions ADLs/Self Care Home Management;Moist Heat;Electrical Stimulation;Gait training;DME Instruction;Functional mobility training;Stair training;Neuromuscular re-education;Balance training;Therapeutic exercise;Therapeutic  activities;Patient/family education;Orthotic Fit/Training;Vestibular;Passive range of motion;Manual techniques;Wheelchair mobility training    PT Next Visit Plan [place] RLE strengthening, standing balance, transfers and gait training with bariatric RW.    Consulted and Agree with Plan of Care Patient;Other (Comment)   girlfriend - [name]          Patient will benefit from skilled therapeutic intervention in order to improve the following deficits and impairments:  Abnormal gait,Decreased activity tolerance,Decreased balance,Decreased endurance,Decreased mobility,Decreased knowledge of use of DME,Decreased range of motion,Decreased strength,Impaired UE functional use,Impaired tone  Visit Diagnosis: Other abnormalities of gait and mobility  Muscle weakness (generalized)  Hemiplegia and hemiparesis following cerebral infarction affecting right non-dominant side [facility])     Problem List Patient Active Problem List   Diagnosis Date Noted  . CKD (chronic kidney disease) stage 2, GFR 60-89 ml/min 10/18/2020  . COVID-19   . Acute renal failure superimposed on stage 3a chronic kidney disease ([place])   . Pneumonia due to COVID-19 virus 09/02/2020  . Acute ischemic left middle cerebral artery (MCA) stroke ([place]) 05/11/2020  . Cerebral infarction, watershed distribution, unilateral, acute ([place]) 05/11/2020  . Dysarthria as late effect of cerebrovascular accident (CVA) 05/11/2020  . Visual field constriction,  bilateral 05/11/2020  . Hemiparesis affecting right side as late effect of cerebrovascular accident ([place]) 05/11/2020  . Dysarthria, post-stroke 04/07/2020  . 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  . Hyperglycemia   . Anemia   . Acute pain of right knee   . Primary osteoarthritis of right knee   . Expressive language impairment   . Benign essential HTN   . Acute bilat watershed infarction [facility]) 03/16/2020  . Cerebral infarction, watershed distribution, unilateral, chronic 03/16/2020  .  Acute renal failure ([place])   . History of CVA in adulthood   . Dyslipidemia   . CVA (cerebral vascular accident) ([place]) 03/11/2020  . Leukocytosis   . Hemiparesis affecting dominant side as late effect of stroke ([place])   . Labile blood pressure   . Sleep disturbance   . Benign hypertensive heart and kidney disease with diastolic CHF, NYHA class II and CKD stage III ([place])   . Chronic systolic congestive heart failure ([place])   . Morbid obesity ([place])   . Uncontrolled hypertension   . Dysphagia, post-stroke   . Cardiomegaly   . Acute systolic congestive heart failure ([place])   . Left middle cerebral artery stroke ([place]) 07/17/2019  . Cerebral embolism with cerebral infarction 07/11/2019  . Acute CVA (cerebrovascular accident) ([place]) 07/11/2019  . Slurred speech 07/10/2019  . Cardiomyopathy ([place]) 11/20/2018  . History of stroke 09/06/2018  . Tobacco use 09/06/2018  . History of non-ST elevation myocardial infarction (NSTEMI) 05/05/2018  . Severe Vitamin D deficiency 04/02/2018  . Community acquired pneumonia of left lower lobe of lung 04/02/2018  . CKD (chronic kidney disease), stage III ([place]) = Secondary to Hypertension 04/02/2018  . Pneumonia 03/30/2018  . Metabolic acidosis 67/20/9470  . AKI (acute kidney injury) ([place]) 03/30/2018  . N&V (nausea and vomiting) 03/30/2018  . Essential hypertension 03/30/2018    Electa Sniff, PT, DPT, NCS 05/08/2021, 5:49 PM  [facility] [address] [place], [place], [address] Phone: [phone]   Fax:  [phone]  Name: Zachary Mccann MRN: [id number] Date of Birth: March 20, 1971

## 2021-05-11 ENCOUNTER — Other Ambulatory Visit: Payer: Self-pay

## 2021-05-11 ENCOUNTER — Ambulatory Visit: Payer: Medicaid Other

## 2021-05-11 DIAGNOSIS — M6281 Muscle weakness (generalized): Secondary | ICD-10-CM

## 2021-05-11 DIAGNOSIS — I69353 Hemiplegia and hemiparesis following cerebral infarction affecting right non-dominant side: Secondary | ICD-10-CM | POA: Diagnosis not present

## 2021-05-11 DIAGNOSIS — R2681 Unsteadiness on feet: Secondary | ICD-10-CM | POA: Diagnosis not present

## 2021-05-11 DIAGNOSIS — R2689 Other abnormalities of gait and mobility: Secondary | ICD-10-CM

## 2021-05-11 NOTE — Therapy (Signed)
[name] [address] [place], [place], [address] Phone: [phone]   Fax:  [phone]  Physical Therapy Treatment  Patient Details  Name: Zachary Mccann MRN: [id number] Date of Birth: July 26, 1971 Referring Provider (PT): Frann Rider, NP   Encounter Date: 05/11/2021   PT End of Session - 05/11/21 1103    Visit Number 12    Number of Visits 17    Date for PT Re-Evaluation 05/19/21    Authorization Type Medicaid 4/8-6/20 16 visits    Authorization - Visit Number 11    Authorization - Number of Visits 16    PT Start Time 1101    PT Stop Time 1144    PT Time Calculation (min) 43 min    Equipment Utilized During Treatment Gait belt    Activity Tolerance Patient tolerated treatment well    Behavior During Therapy [facility] for tasks assessed/performed           Past Medical History:  Diagnosis Date  . CAD (coronary artery disease)    a. s/p NSTEMI in 04/2018 with DES to LCx.   [name] Hypertension   . Myocardial infarction ([place])   . Pneumonia   . Stroke [facility])     Past Surgical History:  Procedure Laterality Date  . CORONARY STENT INTERVENTION N/A 05/05/2018   Procedure: CORONARY STENT INTERVENTION;  Surgeon: Leonie Man, MD;  Location: [facility];  Service: Cardiovascular;  Laterality: N/A;  . LEFT HEART CATH AND CORONARY ANGIOGRAPHY N/A 05/05/2018   Procedure: LEFT HEART CATH AND CORONARY ANGIOGRAPHY;  Surgeon: Leonie Man, MD;  Location: [facility];  Service: Cardiovascular;  Laterality: N/A;  . TEE WITHOUT CARDIOVERSION N/A 09/08/2018   Procedure: TRANSESOPHAGEAL ECHOCARDIOGRAM (TEE);  Surgeon: Larey Dresser, MD;  Location: [facility] ENDOSCOPY;  Service: Cardiovascular;  Laterality: N/A;    There were no vitals filed for this visit.   Subjective Assessment - 05/11/21 1103    Subjective Pt reports that congestion has been better. Right leg was tired after last session but he felt good.     Patient is accompained by: --   Zachary Mccann - girlfriend   Pertinent History history of hypertension, tobacco abuse, CAD with non-STEMI 04/2018 as well as left ICA occlusion July 2020 with associated CVA causing right side weakness and slurred speech. New watershed 3/21; Covid 9/21 - hospitalized 9-17- -09-04-20    Patient Stated Goals increase strength, be able to stand and walk as he was able to before Covid    Currently in Pain? No/denies    Pain Onset More than a month ago                             [facility] Adult PT Treatment/Exercise - 05/11/21 1104      Transfers   Transfers Sit to Stand;Stand to Sit    Sit to Stand 3: Mod assist;4: Min guard    Sit to Stand Details (indicate cue type and reason) Pt performed from w/c mod assist. Sit to stand from elevated mat x 5 CGA. Pt was given verbal cues to push from both hands from mat and lean forward to prevent twisting and to increase right weight shift.    Stand to Sit 4: Min guard;4: Min assist    Stand to Sit Details (indicate cue type and reason) Verbal cues for technique      Ambulation/Gait   Ambulation/Gait Yes  Ambulation/Gait Assistance 4: Min guard    Ambulation/Gait Assistance Details Pt was given verbal cues to increase right foot clearance and increase left step length with staying up tall in walker.    Ambulation Distance (Feet) 115 Feet   rest break half way   Assistive device Rolling walker   right foot up brace   Gait Pattern Step-through pattern;Decreased hip/knee flexion - right    Ambulation Surface Level;Indoor      Neuro Re-ed    Neuro Re-ed Details  Standing at walker: weight shifting to right x 5 with verbal and tactile cues for erect posture. Standing without UE support x 30 sec, reaching for targets without UE support x 30 sec, tossing bean bags to basket x 14 maintaining balance without UE support CGA,                  PT Education - 05/11/21 1301    Education Details PT discussed pt  walking more at home to help with stamina.    Person(s) Educated Patient;Other (comment)   girlfriend   Methods Explanation    Comprehension Verbalized understanding            PT Short Term Goals - 05/02/21 1813      PT SHORT TERM GOAL #1   Title Pt will be independent with initial HEP for strengthening and stretching.    Baseline HEP to be established    Time 4    Period Weeks    Status Achieved    Target Date 04/21/21      PT SHORT TERM GOAL #2   Title Pt will perform squat pivot transfers to level surface with min assist.    Baseline mod assist for squat pivot transfers; met 04-24-21    Time 4    Period Weeks    Status Achieved    Target Date 04/21/20      PT SHORT TERM GOAL #3   Title Pt will be able to perform sit to stand transfer from wheelchair to RW with min assist +1.    Baseline mod assist +2 from mat to RW; min to mod assist +1 on 04-24-21    Time 4    Period Weeks    Status Partially Met    Target Date 04/21/20      PT SHORT TERM GOAL #4   Title Pt will ambulate 60' with RW with +1 mod assist for improved household mobility.    Baseline 20' with RW with mod assist (2nd person present for safety). 04/17/21 min assist up to 115' varying throughout treatment on distance as fatigues.    Time 4    Period Weeks    Status Achieved    Target Date 04/21/21      PT SHORT TERM GOAL #5   Title Assess need for Rt AFO and obtain orthotic consult if needed.    Baseline Assessment for AFO to be completed; AFO not needed - Foot up brace on RLE beneficial - 04-20-21    Time 4    Period Weeks    Status Achieved    Target Date 04/21/21             PT Long Term Goals - 05/02/21 1814      PT LONG TERM GOAL #1   Title Pt will perform sit to stand transfers with +1 min assist.    Baseline max assist    Time 8    Period Weeks    Status New  Target Date 05/19/21      PT LONG TERM GOAL #2   Title Pt will amb. with RW 100' with +1 min assist for household mobility.     Baseline 20' with mod assist with RW    Time 8    Period Weeks    Status New      PT LONG TERM GOAL #3   Title Pt will be able to ambulate >100' CGA with RW for further improvement in household mobility.    Baseline 20' mod assist with RW    Time 8    Period Weeks    Status New      PT LONG TERM GOAL #4   Title Pt will be able to maintain standing x 5 min with supervision with light UE support on walker or counter for improved balance and participation with ADLs.    Baseline Bil. UE support on counter for 1" with min assist    Time 8    Period Weeks    Status New      PT LONG TERM GOAL #5   Title Caregiver will assist pt in performing updated HEP to assist with functional strengthening and stretching RLE.    Baseline Dependent    Time 8    Period Weeks    Status New                 Plan - 05/11/21 1303    Clinical Impression Statement PT started with more functional strengthening and balance activities today. Pt had improved stability without UE support. Did fatigue quicker after with gait.    Personal Factors and Comorbidities Comorbidity 3+;Fitness;Time since onset of injury/illness/exacerbation    Comorbidities hypertension, tobacco abuse, CAD with non-STEMI 04/2018 as well as left ICA occlusion July 2020    Examination-Activity Limitations Bathing;Hygiene/Grooming;Stairs;Bed Mobility;Locomotion Level;Stand;Toileting;Transfers    Examination-Participation Restrictions Community Activity;Cleaning;Meal Prep;Driving;Shop;Laundry    Stability/Clinical Decision Making Evolving/Moderate complexity    Rehab Potential Good    PT Frequency 2x / week    PT Duration 8 weeks   10 weeks plus eval   PT Treatment/Interventions ADLs/Self Care Home Management;Moist Heat;Electrical Stimulation;Gait training;DME Instruction;Functional mobility training;Stair training;Neuromuscular re-education;Balance training;Therapeutic exercise;Therapeutic activities;Patient/family education;Orthotic  Fit/Training;Vestibular;Passive range of motion;Manual techniques;Wheelchair mobility training    PT Next Visit Plan Next week will be week 8. Check goals to decide course but will most likely discharge for now. Start with gait and time how long he goes for.    Consulted and Agree with Plan of Care Patient;Other (Comment)   girlfriend - [name]          Patient will benefit from skilled therapeutic intervention in order to improve the following deficits and impairments:  Abnormal gait,Decreased activity tolerance,Decreased balance,Decreased endurance,Decreased mobility,Decreased knowledge of use of DME,Decreased range of motion,Decreased strength,Impaired UE functional use,Impaired tone  Visit Diagnosis: Other abnormalities of gait and mobility  Muscle weakness (generalized)     Problem List Patient Active Problem List   Diagnosis Date Noted  . CKD (chronic kidney disease) stage 2, GFR 60-89 ml/min 10/18/2020  . COVID-19   . Acute renal failure superimposed on stage 3a chronic kidney disease ([place])   . Pneumonia due to COVID-19 virus 09/02/2020  . Acute ischemic left middle cerebral artery (MCA) stroke ([place]) 05/11/2020  . Cerebral infarction, watershed distribution, unilateral, acute ([place]) 05/11/2020  . Dysarthria as late effect of cerebrovascular accident (CVA) 05/11/2020  . Visual field constriction, bilateral 05/11/2020  . Hemiparesis affecting right side as late effect of  cerebrovascular accident ([place]) 05/11/2020  . Dysarthria, post-stroke 04/07/2020  . 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  . Hyperglycemia   . Anemia   . Acute pain of right knee   . Primary osteoarthritis of right knee   . Expressive language impairment   . Benign essential HTN   . Acute bilat watershed infarction [facility]) 03/16/2020  . Cerebral infarction, watershed distribution, unilateral, chronic 03/16/2020  . Acute renal failure ([place])   . History of CVA in adulthood   . Dyslipidemia   . CVA (cerebral  vascular accident) ([place]) 03/11/2020  . Leukocytosis   . Hemiparesis affecting dominant side as late effect of stroke ([employer])   . Labile blood pressure   . Sleep disturbance   . Benign hypertensive heart and kidney disease with diastolic CHF, NYHA class II and CKD stage III ([place])   . Chronic systolic congestive heart failure ([place])   . Morbid obesity ([place])   . Uncontrolled hypertension   . Dysphagia, post-stroke   . Cardiomegaly   . Acute systolic congestive heart failure ([place])   . Left middle cerebral artery stroke ([place]) 07/17/2019  . Cerebral embolism with cerebral infarction 07/11/2019  . Acute CVA (cerebrovascular accident) ([place]) 07/11/2019  . Slurred speech 07/10/2019  . Cardiomyopathy ([place]) 11/20/2018  . History of stroke 09/06/2018  . Tobacco use 09/06/2018  . History of non-ST elevation myocardial infarction (NSTEMI) 05/05/2018  . Severe Vitamin D deficiency 04/02/2018  . Community acquired pneumonia of left lower lobe of lung 04/02/2018  . CKD (chronic kidney disease), stage III ([place]) = Secondary to Hypertension 04/02/2018  . Pneumonia 03/30/2018  . Metabolic acidosis 54/65/6812  . AKI (acute kidney injury) ([place]) 03/30/2018  . N&V (nausea and vomiting) 03/30/2018  . Essential hypertension 03/30/2018    Electa Sniff, PT, DPT, NCS 05/11/2021, 1:06 PM  [facility] [address] [place], [place], [address] Phone: [phone]   Fax:  [phone]  Name: YARETH MACDONNELL MRN: [id number] Date of Birth: 04/05/1971

## 2021-05-12 DIAGNOSIS — Z23 Encounter for immunization: Secondary | ICD-10-CM | POA: Diagnosis not present

## 2021-05-17 ENCOUNTER — Ambulatory Visit: Payer: Medicaid Other

## 2021-05-17 DIAGNOSIS — Z419 Encounter for procedure for purposes other than remedying health state, unspecified: Secondary | ICD-10-CM | POA: Diagnosis not present

## 2021-05-18 DIAGNOSIS — I5042 Chronic combined systolic (congestive) and diastolic (congestive) heart failure: Secondary | ICD-10-CM | POA: Diagnosis not present

## 2021-05-18 DIAGNOSIS — R768 Other specified abnormal immunological findings in serum: Secondary | ICD-10-CM | POA: Diagnosis not present

## 2021-05-18 DIAGNOSIS — I129 Hypertensive chronic kidney disease with stage 1 through stage 4 chronic kidney disease, or unspecified chronic kidney disease: Secondary | ICD-10-CM | POA: Diagnosis not present

## 2021-05-18 DIAGNOSIS — D508 Other iron deficiency anemias: Secondary | ICD-10-CM | POA: Diagnosis not present

## 2021-05-18 DIAGNOSIS — D638 Anemia in other chronic diseases classified elsewhere: Secondary | ICD-10-CM | POA: Diagnosis not present

## 2021-05-18 DIAGNOSIS — N1831 Chronic kidney disease, stage 3a: Secondary | ICD-10-CM | POA: Diagnosis not present

## 2021-05-19 ENCOUNTER — Ambulatory Visit: Payer: Medicaid Other

## 2021-05-24 ENCOUNTER — Ambulatory Visit: Payer: Medicaid Other

## 2021-05-25 ENCOUNTER — Other Ambulatory Visit: Payer: Medicaid Other

## 2021-05-26 ENCOUNTER — Ambulatory Visit: Payer: Medicaid Other

## 2021-05-26 NOTE — Patient Outreach (Signed)
Due for meds on 06/01/21  Hydralazine  25 mg   1.5  1.5   Ferrous Sulfate  324 mg   1     Vitamin D3   125 mcg    1   Carvedilol  6.25 mg  1  1   Amlodipine  10 mg   1     Clopidogrel  75 mg   1     Atorvastatin  40 mg     1     F/U 1 month. Told them patient needs f/u with PCP before they'll send in new scripts of OTC refills. They'll schedule next week

## 2021-05-31 ENCOUNTER — Ambulatory Visit: Payer: Medicaid Other | Attending: Adult Health

## 2021-05-31 ENCOUNTER — Other Ambulatory Visit: Payer: Self-pay

## 2021-05-31 DIAGNOSIS — R2689 Other abnormalities of gait and mobility: Secondary | ICD-10-CM | POA: Insufficient documentation

## 2021-05-31 DIAGNOSIS — R278 Other lack of coordination: Secondary | ICD-10-CM | POA: Insufficient documentation

## 2021-05-31 DIAGNOSIS — R2681 Unsteadiness on feet: Secondary | ICD-10-CM | POA: Diagnosis not present

## 2021-05-31 DIAGNOSIS — I69353 Hemiplegia and hemiparesis following cerebral infarction affecting right non-dominant side: Secondary | ICD-10-CM | POA: Diagnosis not present

## 2021-05-31 DIAGNOSIS — M6281 Muscle weakness (generalized): Secondary | ICD-10-CM | POA: Diagnosis not present

## 2021-05-31 NOTE — Therapy (Signed)
[facility] [address] [place], [place], [address] Phone: [phone]   Fax:  [phone]  Physical Therapy Treatment  Patient Details  Name: Zachary Mccann MRN: [id number] Date of Birth: Mar 14, 1971 Referring Provider (PT): Frann Rider, NP   Encounter Date: 05/31/2021   PT End of Session - 05/31/21 1019     Visit Number 13    Number of Visits 17    Date for PT Re-Evaluation 05/19/21    Authorization Type Medicaid 4/8-6/20 16 visits    Authorization - Visit Number 11    Authorization - Number of Visits 16    PT Start Time 4627    PT Stop Time 1100    PT Time Calculation (min) 45 min    Equipment Utilized During Treatment Gait belt    Activity Tolerance Patient tolerated treatment well    Behavior During Therapy [facility] for tasks assessed/performed             Past Medical History:  Diagnosis Date   CAD (coronary artery disease)    a. s/p NSTEMI in 04/2018 with DES to LCx.    Hypertension    Myocardial infarction ([place])    Pneumonia    Stroke [facility])     Past Surgical History:  Procedure Laterality Date   CORONARY STENT INTERVENTION N/A 05/05/2018   Procedure: CORONARY STENT INTERVENTION;  Surgeon: Leonie Man, MD;  Location: [facility];  Service: Cardiovascular;  Laterality: N/A;   LEFT HEART CATH AND CORONARY ANGIOGRAPHY N/A 05/05/2018   Procedure: LEFT HEART CATH AND CORONARY ANGIOGRAPHY;  Surgeon: Leonie Man, MD;  Location: [facility];  Service: Cardiovascular;  Laterality: N/A;   TEE WITHOUT CARDIOVERSION N/A 09/08/2018   Procedure: TRANSESOPHAGEAL ECHOCARDIOGRAM (TEE);  Surgeon: Larey Dresser, MD;  Location: [facility] ENDOSCOPY;  Service: Cardiovascular;  Laterality: N/A;    There were no vitals filed for this visit.   Subjective Assessment - 05/31/21 1019     Subjective Congestion better, rated as slight today, humid air tires him out    Patient is accompained by: [name] - girlfriend   Pertinent History history of hypertension, tobacco abuse, CAD with non-STEMI 04/2018 as well as left ICA occlusion July 2020 with associated CVA causing right side weakness and slurred speech. New [place] 3/21; Covid 9/21 - hospitalized 9-17- -09-04-20    Patient Stated Goals increase strength, be able to stand and walk as he was able to before Covid    Currently in Pain? No/denies    Pain Onset More than a month ago                               [facility] Adult PT Treatment/Exercise - 05/31/21 0001       Transfers   Transfers Sit to Stand    Sit to Stand 4: Min assist    Sit to Stand Details (indicate cue type and reason) from [facility]    Stand to Sit 4: Min guard    Stand to Sit Details (indicate cue type and reason) Verbal cues for technique    Comments able to stand with walker support for 90s      Ambulation/Gait   Ambulation/Gait Yes    Ambulation/Gait Assistance 4: Min guard    Ambulation/Gait Assistance Details O2 sats 93% pre, 97% post    Ambulation Distance (Feet) 60 Feet    Assistive device  Rolling walker    Gait Pattern Step-through pattern;Decreased hip/knee flexion - right    Ambulation Surface Level;Indoor    Gait Comments VCs for weight shifting and postural correction      Knee/Hip Exercises: Seated   Long Arc Quad Both;1 set;10 reps    Long Arc Quad Limitations assist at R knee    Heel Slides Strengthening;Both;1 set;10 reps    Heel Slides Limitations No assist needed    Marching Both;1 set;10 reps;Limitations    Marching Limitations CGA needed at R LE    Abduction/Adduction  Both;1 set;10 reps                      PT Short Term Goals - 05/31/21 1022       PT SHORT TERM GOAL #1   Title Pt will be independent with initial HEP for strengthening and stretching.    Baseline HEP to be established    Time 4    Period Weeks    Status Achieved    Target Date 04/21/21      PT SHORT TERM GOAL #2   Title Pt will  perform squat pivot transfers to level surface with min assist.    Baseline mod assist for squat pivot transfers; met 04-24-21    Time 4    Period Weeks    Status Achieved    Target Date 04/21/20      PT SHORT TERM GOAL #3   Title Pt will be able to perform sit to stand transfer from wheelchair to RW with min assist +1.    Baseline mod assist +2 from mat to RW; min to mod assist +1 on 04-24-21; 05/31/21 patient able to stand from [facility] with CGA afetr several attempts begining with MinA    Time 4    Period Weeks    Status Partially Met    Target Date 04/21/20      PT SHORT TERM GOAL #4   Title Pt will ambulate 36' with RW with +1 mod assist for improved household mobility.    Baseline 20' with RW with mod assist (2nd person present for safety). 04/17/21 min assist up to 115' varying throughout treatment on distance as fatigues.    Time 4    Period Weeks    Status Achieved    Target Date 04/21/21      PT SHORT TERM GOAL #5   Title Assess need for Rt AFO and obtain orthotic consult if needed.    Baseline Assessment for AFO to be completed; AFO not needed - Foot up brace on RLE beneficial - 04-20-21    Time 4    Period Weeks    Status Achieved    Target Date 04/21/21               PT Long Term Goals - 05/31/21 1027       PT LONG TERM GOAL #1   Title Pt will perform sit to stand transfers with +1 min assist.    Baseline max assist; 05/31/21 MinA to CGA pending fatigue    Time 8    Period Weeks    Status Partially Met      PT LONG TERM GOAL #2   Title Pt will amb. with RW 100' with +1 min assist for household mobility.    Baseline 20' with mod assist with RW 05/31/21 Ambulation distance 11fwit RW and CGA x2 for safety    Time 8    Period  Weeks    Status Partially Met      PT LONG TERM GOAL #3   Title Pt will be able to ambulate >100' CGA with RW for further improvement in household mobility.    Baseline 20' mod assist with RW;05/31/21 Ambulation distance 61fwit RW and CGA x2 for  safety    Time 8    Period Weeks    Status New      PT LONG TERM GOAL #4   Title Pt will be able to maintain standing x 5 min with supervision with light UE support on walker or counter for improved balance and participation with ADLs.    Baseline Bil. UE support on counter for 1" with min assist; 05/31/21 90s tolerance with SBA    Time 8    Period Weeks    Status New      PT LONG TERM GOAL #5   Title Caregiver will assist pt in performing updated HEP to assist with functional strengthening and stretching RLE.    Baseline Dependent; 05/31/21 No CG present today    Time 8    Period Weeks    Status New                   Plan - 05/31/21 1055     Clinical Impression Statement [facility] session [facility] gait [facility].  Patient faigued easily and required rest breaks, O2 sats monitored throughout session.  Goal progress assessed as noted    Personal Factors and Comorbidities Comorbidity 3+;Fitness;Time since onset of injury/illness/exacerbation    Comorbidities hypertension, tobacco abuse, CAD with non-STEMI 04/2018 as well as left ICA occlusion July 2020    Examination-Activity Limitations Bathing;Hygiene/Grooming;Stairs;Bed Mobility;Locomotion Level;Stand;Toileting;Transfers    Examination-Participation Restrictions Community Activity;Cleaning;Meal Prep;Driving;Shop;Laundry    Stability/Clinical Decision Making Evolving/Moderate complexity    Rehab Potential Good    PT Frequency 2x / week    PT Duration 8 weeks   10 weeks plus eval   PT Treatment/Interventions ADLs/Self Care Home Management;Moist Heat;Electrical Stimulation;Gait training;DME Instruction;Functional mobility training;Stair training;Neuromuscular re-education;Balance training;Therapeutic exercise;Therapeutic activities;Patient/family education;Orthotic Fit/Training;Vestibular;Passive range of motion;Manual techniques;Wheelchair mobility training    PT Next Visit Plan  Check goals to decide course but will most likely discharge for now. Start with gait and time how long he goes for.  Continue transfer and endurance training to address functional deficits, cert expires 69/37/16   Consulted and Agree with Plan of Care Patient;Other (Comment)   girlfriend - [name]            Patient will benefit from skilled therapeutic intervention in order to improve the following deficits and impairments:  Abnormal gait, Decreased activity tolerance, Decreased balance, Decreased endurance, Decreased mobility, Decreased knowledge of use of DME, Decreased range of motion, Decreased strength, Impaired UE functional use, Impaired tone  Visit Diagnosis: Unsteadiness on feet  Other abnormalities of gait and mobility  Muscle weakness (generalized)  Hemiplegia and hemiparesis following cerebral infarction affecting right non-dominant side ([facility]     Problem List Patient Active Problem List   Diagnosis Date Noted   CKD (chronic kidney disease) stage 2, GFR 60-89 ml/min 10/18/2020   COVID-19    Acute renal failure superimposed on stage 3a chronic kidney disease ([place]    Pneumonia due to COVID-19 virus 09/02/2020   Acute ischemic left middle cerebral artery (MCA) stroke ([place] 05/11/2020   Cerebral infarction, watershed distribution, unilateral, acute ([place] 05/11/2020   Dysarthria as late effect of cerebrovascular accident (  CVA) 05/11/2020   Visual field constriction, bilateral 05/11/2020   Hemiparesis affecting right side as late effect of cerebrovascular accident ([place]) 05/11/2020   Dysarthria, post-stroke 04/07/2020   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   Hyperglycemia    Anemia    Acute pain of right knee    Primary osteoarthritis of right knee    Expressive language impairment    Benign essential HTN    Acute bilat watershed infarction [facility]) 03/16/2020   Cerebral infarction, watershed distribution, unilateral, chronic 03/16/2020   Acute renal failure (HCC)     History of CVA in adulthood    Dyslipidemia    CVA (cerebral vascular accident) ([place]) 03/11/2020   Leukocytosis    Hemiparesis affecting dominant side as late effect of stroke ([place])    Labile blood pressure    Sleep disturbance    Benign hypertensive heart and kidney disease with diastolic CHF, NYHA class II and CKD stage III (HCC)    Chronic systolic congestive heart failure ([place])    Morbid obesity ([place])    Uncontrolled hypertension    Dysphagia, post-stroke    Cardiomegaly    Acute systolic congestive heart failure (HCC)    Left middle cerebral artery stroke ([place]) 07/17/2019   Cerebral embolism with cerebral infarction 07/11/2019   Acute CVA (cerebrovascular accident) ([place]) 07/11/2019   Slurred speech 07/10/2019   Cardiomyopathy ([place]) 11/20/2018   History of stroke 09/06/2018   Tobacco use 09/06/2018   History of non-ST elevation myocardial infarction (NSTEMI) 05/05/2018   Severe Vitamin D deficiency 04/02/2018   Community acquired pneumonia of left lower lobe of lung 04/02/2018   CKD (chronic kidney disease), stage III ([place]) = Secondary to Hypertension 04/02/2018   Pneumonia 03/52/4818   Metabolic acidosis 59/08/3111   AKI (acute kidney injury) ([place]) 03/30/2018   N&V (nausea and vomiting) 03/30/2018   Essential hypertension 03/30/2018    Lanice Shirts PT 05/31/2021, 1:41 PM  [facility] [address] [place], [place], [address] Phone: [phone]   Fax:  [phone]  Name: Zachary Mccann MRN: [id number] Date of Birth: [birth year]

## 2021-06-02 ENCOUNTER — Other Ambulatory Visit: Payer: Self-pay

## 2021-06-02 ENCOUNTER — Ambulatory Visit: Payer: Medicaid Other

## 2021-06-02 DIAGNOSIS — R278 Other lack of coordination: Secondary | ICD-10-CM | POA: Diagnosis not present

## 2021-06-02 DIAGNOSIS — M6281 Muscle weakness (generalized): Secondary | ICD-10-CM | POA: Diagnosis not present

## 2021-06-02 DIAGNOSIS — R2681 Unsteadiness on feet: Secondary | ICD-10-CM | POA: Diagnosis not present

## 2021-06-02 DIAGNOSIS — I69353 Hemiplegia and hemiparesis following cerebral infarction affecting right non-dominant side: Secondary | ICD-10-CM | POA: Diagnosis not present

## 2021-06-02 DIAGNOSIS — R2689 Other abnormalities of gait and mobility: Secondary | ICD-10-CM

## 2021-06-02 DIAGNOSIS — Z23 Encounter for immunization: Secondary | ICD-10-CM | POA: Diagnosis not present

## 2021-06-02 NOTE — Therapy (Signed)
[facility] [address] [place], [place], [address] Phone: [phone]   Fax:  [phone]  Physical Therapy Re-certification  Patient Details  Name: Zachary Mccann MRN: [id number] Date of Birth: 1971-06-01 Referring Provider (PT): Frann Rider, NP   Encounter Date: 06/02/2021   PT End of Session - 06/02/21 1027     Visit Number 14    Number of Visits 17    Date for PT Re-Evaluation 08/25/21    Authorization Type Medicaid 4/8-6/20 16 visits; Recert on 04/12/05 for 1x/week for 12 more sessions (06/02/21 to 08/25/21)    Authorization - Visit Number 12    Authorization - Number of Visits 16    PT Start Time 1020    PT Stop Time 1100    PT Time Calculation (min) 40 min    Equipment Utilized During Treatment Gait belt    Activity Tolerance Patient tolerated treatment well    Behavior During Therapy WFL for tasks assessed/performed             Past Medical History:  Diagnosis Date   CAD (coronary artery disease)    a. s/p NSTEMI in 04/2018 with DES to LCx.    Hypertension    Myocardial infarction ([place])    Pneumonia    Stroke [facility])     Past Surgical History:  Procedure Laterality Date   CORONARY STENT INTERVENTION N/A 05/05/2018   Procedure: CORONARY STENT INTERVENTION;  Surgeon: Leonie Man, MD;  Location: [facility];  Service: Cardiovascular;  Laterality: N/A;   LEFT HEART CATH AND CORONARY ANGIOGRAPHY N/A 05/05/2018   Procedure: LEFT HEART CATH AND CORONARY ANGIOGRAPHY;  Surgeon: Leonie Man, MD;  Location: [place];  Service: Cardiovascular;  Laterality: N/A;   TEE WITHOUT CARDIOVERSION N/A 09/08/2018   Procedure: TRANSESOPHAGEAL ECHOCARDIOGRAM (TEE);  Surgeon: Larey Dresser, MD;  Location: [facility] ENDOSCOPY;  Service: Cardiovascular;  Laterality: N/A;    There were no vitals filed for this visit.   Subjective Assessment - 06/02/21 1024     Subjective No new falls.    Patient is  accompained by: --   Zachary Mccann - girlfriend   Pertinent History history of hypertension, tobacco abuse, CAD with non-STEMI 04/2018 as well as left ICA occlusion July 2020 with associated CVA causing right side weakness and slurred speech. New [place] 3/21; Covid 9/21 - hospitalized 9-17- -09-04-20    Patient Stated Goals increase strength, be able to stand and walk as he was able to before Covid    Currently in Pain? No/denies    Pain Onset More than a month ago                   Sit to stand: 2 x 5 in parallel bars with bil HHA, cues for getting up quickly  Gait training: in parallel bars: 5 x 6 feet walking fwd and bwd, cues for picking up R LE when walking bwd Walking sideways in parallel bars: bil HHA2 x 6 feet R and L                      PT Short Term Goals - 05/31/21 1022       PT SHORT TERM GOAL #1   Title Pt will be independent with initial HEP for strengthening and stretching.    Baseline HEP to be established    Time 4    Period Weeks    Status Achieved  Target Date 04/21/21      PT SHORT TERM GOAL #2   Title Pt will perform squat pivot transfers to level surface with min assist.    Baseline mod assist for squat pivot transfers; met 04-24-21    Time 4    Period Weeks    Status Achieved    Target Date 04/21/20      PT SHORT TERM GOAL #3   Title Pt will be able to perform sit to stand transfer from wheelchair to RW with min assist +1.    Baseline mod assist +2 from mat to RW; min to mod assist +1 on 04-24-21; 05/31/21 patient able to stand from [facility] with CGA afetr several attempts begining with MinA    Time 4    Period Weeks    Status Partially Met    Target Date 04/21/20      PT SHORT TERM GOAL #4   Title Pt will ambulate 42' with RW with +1 mod assist for improved household mobility.    Baseline 20' with RW with mod assist (2nd person present for safety). 04/17/21 min assist up to 115' varying throughout treatment on distance as fatigues.     Time 4    Period Weeks    Status Achieved    Target Date 04/21/21      PT SHORT TERM GOAL #5   Title Assess need for Rt AFO and obtain orthotic consult if needed.    Baseline Assessment for AFO to be completed; AFO not needed - Foot up brace on RLE beneficial - 04-20-21    Time 4    Period Weeks    Status Achieved    Target Date 04/21/21               PT Long Term Goals - 06/02/21 1025       PT LONG TERM GOAL #1   Title Pt will perform sit to stand transfers with +1 min assist.    Baseline max assist; 05/31/21 MinA to CGA pending fatigue    Time 8    Period Weeks    Status Partially Met      PT LONG TERM GOAL #2   Title Pt will amb. with RW 100' with +1 min assist for household mobility.    Baseline 20' with mod assist with RW 05/31/21 Ambulation distance 81fwit RW and CGA x2 for safety    Time 8    Period Weeks    Status Partially Met      PT LONG TERM GOAL #3   Title Pt will be able to ambulate >100' CGA with RW for further improvement in household mobility.    Baseline 20' mod assist with RW;05/31/21 Ambulation distance 67fit RW and CGA x2 for safety    Time 8    Period Weeks    Status New      PT LONG TERM GOAL #4   Title Pt will be able to maintain standing x 5 min with supervision with light UE support on walker or counter for improved balance and participation with ADLs.    Baseline Bil. UE support on counter for 1" with min assist; 05/31/21 90s tolerance with SBA    Time 8    Period Weeks    Status On-going      PT LONG TERM GOAL #5   Title Caregiver will assist pt in performing updated HEP to assist with functional strengthening and stretching RLE.    Baseline Dependent; 05/31/21  No CG present today    Time 8    Period Weeks    Status On-going                   Plan - 06/02/21 1031     Clinical Impression Statement Patient has been seen in physical therapy for muscle weakness and gait and mobility disorder due to CVA. Patient has met 4/5  long term goals and making slow but gradual progress towards his long term goals. Patient requires min A with stand pivot trasnfers and min A x 1 for ambulation for safety with RW. Patient can only walk about 60 feet max with min A at this time. Patient will continue to benefit from skilled PT to improve his functional transfers and ambulation with lesser assistance and to improve independence with functional trasnfers and ambulation.    Personal Factors and Comorbidities Comorbidity 3+;Fitness;Time since onset of injury/illness/exacerbation    Comorbidities hypertension, tobacco abuse, CAD with non-STEMI 04/2018 as well as left ICA occlusion July 2020    Examination-Activity Limitations Bathing;Hygiene/Grooming;Stairs;Bed Mobility;Locomotion Level;Stand;Toileting;Transfers    Examination-Participation Restrictions Community Activity;Cleaning;Meal Prep;Driving;Shop;Laundry    Stability/Clinical Decision Making Stable/Uncomplicated    Clinical Decision Making Moderate    Rehab Potential Good    PT Frequency 1x / week    PT Duration 12 weeks    PT Treatment/Interventions ADLs/Self Care Home Management;Moist Heat;Electrical Stimulation;Gait training;DME Instruction;Functional mobility training;Stair training;Neuromuscular re-education;Balance training;Therapeutic exercise;Therapeutic activities;Patient/family education;Orthotic Fit/Training;Vestibular;Passive range of motion;Manual techniques;Wheelchair mobility training;Joint Manipulations    PT Next Visit Plan Recert performed 0/93/23- discussed with patient to drop frequency to 1x/week for 12 weeks    Consulted and Agree with Plan of Care Patient;Other (Comment)             Patient will benefit from skilled therapeutic intervention in order to improve the following deficits and impairments:  Abnormal gait, Decreased activity tolerance, Decreased balance, Decreased endurance, Decreased mobility, Decreased knowledge of use of DME, Decreased range of  motion, Decreased strength, Impaired UE functional use, Impaired tone  Visit Diagnosis: Unsteadiness on feet  Other abnormalities of gait and mobility  Muscle weakness (generalized)  Hemiplegia and hemiparesis following cerebral infarction affecting right non-dominant side [facility])     Problem List Patient Active Problem List   Diagnosis Date Noted   CKD (chronic kidney disease) stage 2, GFR 60-89 ml/min 10/18/2020   COVID-19    Acute renal failure superimposed on stage 3a chronic kidney disease ([place])    Pneumonia due to COVID-19 virus 09/02/2020   Acute ischemic left middle cerebral artery (MCA) stroke ([place]) 05/11/2020   Cerebral infarction, watershed distribution, unilateral, acute ([place]) 05/11/2020   Dysarthria as late effect of cerebrovascular accident (CVA) 05/11/2020   Visual field constriction, bilateral 05/11/2020   Hemiparesis affecting right side as late effect of cerebrovascular accident ([place]) 05/11/2020   Dysarthria, post-stroke 04/07/2020   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   Hyperglycemia    Anemia    Acute pain of right knee    Primary osteoarthritis of right knee    Expressive language impairment    Benign essential HTN    Acute bilat watershed infarction ([place]) 03/16/2020   Cerebral infarction, watershed distribution, unilateral, chronic 03/16/2020   Acute renal failure (HCC)    History of CVA in adulthood    Dyslipidemia    CVA (cerebral vascular accident) ([place]) 03/11/2020   Leukocytosis    Hemiparesis affecting dominant side as late effect of stroke ([place])    Labile blood pressure  Sleep disturbance    Benign hypertensive heart and kidney disease with diastolic CHF, NYHA class II and CKD stage III (HCC)    Chronic systolic congestive heart failure (HCC)    Morbid obesity ([place])    Uncontrolled hypertension    Dysphagia, post-stroke    Cardiomegaly    Acute systolic congestive heart failure (HCC)    Left middle cerebral artery stroke ([place]) 07/17/2019    Cerebral embolism with cerebral infarction 07/11/2019   Acute CVA (cerebrovascular accident) ([place]) 07/11/2019   Slurred speech 07/10/2019   Cardiomyopathy ([place]) 11/20/2018   History of stroke 09/06/2018   Tobacco use 09/06/2018   History of non-ST elevation myocardial infarction (NSTEMI) 05/05/2018   Severe Vitamin D deficiency 04/02/2018   Community acquired pneumonia of left lower lobe of lung 04/02/2018   CKD (chronic kidney disease), stage III ([place]) = Secondary to Hypertension 04/02/2018   Pneumonia 99/83/3825   Metabolic acidosis 05/39/7673   AKI (acute kidney injury) ([place]) 03/30/2018   N&V (nausea and vomiting) 03/30/2018   Essential hypertension 03/30/2018    Kerrie Pleasure, PT 06/02/2021, 10:45 AM  [facility] [address] [place], [place], [address] Phone: [phone]   Fax:  [phone]  Name: Zachary Mccann MRN: [id number] Date of Birth: 08/26/1971

## 2021-06-05 ENCOUNTER — Ambulatory Visit: Payer: Medicaid Other | Admitting: Physical Therapy

## 2021-06-05 ENCOUNTER — Other Ambulatory Visit: Payer: Self-pay

## 2021-06-05 DIAGNOSIS — I69353 Hemiplegia and hemiparesis following cerebral infarction affecting right non-dominant side: Secondary | ICD-10-CM | POA: Diagnosis not present

## 2021-06-05 DIAGNOSIS — R2689 Other abnormalities of gait and mobility: Secondary | ICD-10-CM

## 2021-06-05 DIAGNOSIS — R2681 Unsteadiness on feet: Secondary | ICD-10-CM | POA: Diagnosis not present

## 2021-06-05 DIAGNOSIS — R278 Other lack of coordination: Secondary | ICD-10-CM

## 2021-06-05 DIAGNOSIS — M6281 Muscle weakness (generalized): Secondary | ICD-10-CM | POA: Diagnosis not present

## 2021-06-05 NOTE — Therapy (Signed)
[facility] [address] [place], [place], [address] Phone: [phone]   Fax:  [phone]  Physical Therapy Treatment  Patient Details  Name: Zachary Mccann MRN: [id number] Date of Birth: 07-11-1971 Referring Provider (PT): Frann Rider, NP   Encounter Date: 06/05/2021   PT End of Session - 06/05/21 1020     Visit Number 15    Number of Visits 29    Date for PT Re-Evaluation 08/25/21    Authorization Type Medicaid 4/8-6/20 16 visits; Recert on 6/96/29 for 1x/week for 12 more sessions (06/02/21 to 08/25/21)    Authorization - Visit Number 13    Authorization - Number of Visits 16    PT Start Time 1020    PT Stop Time 1100    PT Time Calculation (min) 40 min    Equipment Utilized During Treatment Gait belt    Activity Tolerance Patient tolerated treatment well    Behavior During Therapy WFL for tasks assessed/performed             Past Medical History:  Diagnosis Date   CAD (coronary artery disease)    a. s/p NSTEMI in 04/2018 with DES to LCx.    Hypertension    Myocardial infarction ([place])    Pneumonia    Stroke [facility])     Past Surgical History:  Procedure Laterality Date   CORONARY STENT INTERVENTION N/A 05/05/2018   Procedure: CORONARY STENT INTERVENTION;  Surgeon: Leonie Man, MD;  Location: [place] [facility];  Service: Cardiovascular;  Laterality: N/A;   LEFT HEART CATH AND CORONARY ANGIOGRAPHY N/A 05/05/2018   Procedure: LEFT HEART CATH AND CORONARY ANGIOGRAPHY;  Surgeon: Leonie Man, MD;  Location: [place] [facility];  Service: Cardiovascular;  Laterality: N/A;   TEE WITHOUT CARDIOVERSION N/A 09/08/2018   Procedure: TRANSESOPHAGEAL ECHOCARDIOGRAM (TEE);  Surgeon: Larey Dresser, MD;  Location: [facility] ENDOSCOPY;  Service: Cardiovascular;  Laterality: N/A;    There were no vitals filed for this visit.   Subjective Assessment - 06/05/21 1024     Subjective Reports nothing new or different.  No falls noted.    Patient is accompained by: --   [name] - girlfriend   Pertinent History history of hypertension, tobacco abuse, CAD with non-STEMI 04/2018 as well as left ICA occlusion July 2020 with associated CVA causing right side weakness and slurred speech. New watershed 3/21; Covid 9/21 - hospitalized 9-17- -09-04-20    Patient Stated Goals increase strength, be able to stand and walk as he was able to before Covid    Currently in Pain? No/denies    Pain Onset More than a month ago                               [facility] Adult PT Treatment/Exercise - 06/05/21 0001       Transfers   Transfers Sit to Stand    Sit to Stand 4: Min assist    Sit to Stand Details Tactile cues for weight shifting    Sit to Stand Details (indicate cue type and reason) from w/c and from plinth    Stand to Sit 4: Min guard    Stand to Sit Details (indicate cue type and reason) Verbal cues for technique      Ambulation/Gait   Ambulation/Gait Assistance 4: Min guard    Ambulation/Gait Assistance Details Stable O2sat >95% throughout    Ambulation Distance (Feet) 115 Feet  broken into 2 segments with seated rest in between   Assistive device Rolling walker    Gait Pattern Step-through pattern;Decreased hip/knee flexion - right;Decreased dorsiflexion - right    Ambulation Surface Level;Indoor    Gait Comments VCs for weight shifting and postural correction      Knee/Hip Exercises: Standing   Other Standing Knee Exercises Weight shift L<>R 2x10; R LE stance, L LE heel slide x10; L LE stance, R LE heel slide x10      Knee/Hip Exercises: Seated   Long Arc Quad Both;10 reps;2 sets    Heel Slides Strengthening;Both;10 reps;2 sets    Other Seated Knee/Hip Exercises Partial sit<>stand x10    Marching Both;10 reps;Limitations;2 sets    Marching Limitations CGA needed at R LE                    PT Education - 06/05/21 1104     Education Details Discussed HEP and increasing time in  standing and walking at home to improve endurance.    Person(s) Educated Patient    Methods Explanation    Comprehension Verbalized understanding              PT Short Term Goals - 05/31/21 1022       PT SHORT TERM GOAL #1   Title Pt will be independent with initial HEP for strengthening and stretching.    Baseline HEP to be established    Time 4    Period Weeks    Status Achieved    Target Date 04/21/21      PT SHORT TERM GOAL #2   Title Pt will perform squat pivot transfers to level surface with min assist.    Baseline mod assist for squat pivot transfers; met 04-24-21    Time 4    Period Weeks    Status Achieved    Target Date 04/21/20      PT SHORT TERM GOAL #3   Title Pt will be able to perform sit to stand transfer from wheelchair to RW with min assist +1.    Baseline mod assist +2 from mat to RW; min to mod assist +1 on 04-24-21; 05/31/21 patient able to stand from [facility] with CGA afetr several attempts begining with MinA    Time 4    Period Weeks    Status Partially Met    Target Date 04/21/20      PT SHORT TERM GOAL #4   Title Pt will ambulate 23' with RW with +1 mod assist for improved household mobility.    Baseline 20' with RW with mod assist (2nd person present for safety). 04/17/21 min assist up to 115' varying throughout treatment on distance as fatigues.    Time 4    Period Weeks    Status Achieved    Target Date 04/21/21      PT SHORT TERM GOAL #5   Title Assess need for Rt AFO and obtain orthotic consult if needed.    Baseline Assessment for AFO to be completed; AFO not needed - Foot up brace on RLE beneficial - 04-20-21    Time 4    Period Weeks    Status Achieved    Target Date 04/21/21               PT Long Term Goals - 06/02/21 1025       PT LONG TERM GOAL #1   Title Pt will perform sit to stand transfers with +1 min  assist.    Baseline max assist; 05/31/21 MinA to CGA pending fatigue    Time 8    Period Weeks    Status Partially Met       PT LONG TERM GOAL #2   Title Pt will amb. with RW 100' with +1 min assist for household mobility.    Baseline 20' with mod assist with RW 05/31/21 Ambulation distance 68fwit RW and CGA x2 for safety    Time 8    Period Weeks    Status Partially Met      PT LONG TERM GOAL #3   Title Pt will be able to ambulate >100' CGA with RW for further improvement in household mobility.    Baseline 20' mod assist with RW;05/31/21 Ambulation distance 677fit RW and CGA x2 for safety    Time 8    Period Weeks    Status New      PT LONG TERM GOAL #4   Title Pt will be able to maintain standing x 5 min with supervision with light UE support on walker or counter for improved balance and participation with ADLs.    Baseline Bil. UE support on counter for 1" with min assist; 05/31/21 90s tolerance with SBA    Time 8    Period Weeks    Status On-going      PT LONG TERM GOAL #5   Title Caregiver will assist pt in performing updated HEP to assist with functional strengthening and stretching RLE.    Baseline Dependent; 05/31/21 No CG present today    Time 8    Period Weeks    Status On-going                   Plan - 06/05/21 1105     Clinical Impression Statement Treatment focused on continuing to improve R LE strength, increase standing tolerance/ambulation, and improve overall transfers. Pt is SBA for scooting transfers; requires min guard/min A for safety with all standing tasks. Consider sci-fit for pt to work on improving his endurance.    Personal Factors and Comorbidities Comorbidity 3+;Fitness;Time since onset of injury/illness/exacerbation    Comorbidities hypertension, tobacco abuse, CAD with non-STEMI 04/2018 as well as left ICA occlusion July 2020    Examination-Activity Limitations Bathing;Hygiene/Grooming;Stairs;Bed Mobility;Locomotion Level;Stand;Toileting;Transfers    Examination-Participation Restrictions Community Activity;Cleaning;Meal Prep;Driving;Shop;Laundry     Stability/Clinical Decision Making Stable/Uncomplicated    Rehab Potential Good    PT Frequency 1x / week    PT Duration 12 weeks    PT Treatment/Interventions ADLs/Self Care Home Management;Moist Heat;Electrical Stimulation;Gait training;DME Instruction;Functional mobility training;Stair training;Neuromuscular re-education;Balance training;Therapeutic exercise;Therapeutic activities;Patient/family education;Orthotic Fit/Training;Vestibular;Passive range of motion;Manual techniques;Wheelchair mobility training;Joint Manipulations    PT Next Visit Plan Consider sci-fit to work on endurance. Continue gait training, transfers, and improving standing tolerance/stability.    PT Home Exercise Plan Seated marching, heel slide, LAQ. Standing weight shift L<>R.    Consulted and Agree with Plan of Care Patient;Other (Comment)             Patient will benefit from skilled therapeutic intervention in order to improve the following deficits and impairments:  Abnormal gait, Decreased activity tolerance, Decreased balance, Decreased endurance, Decreased mobility, Decreased knowledge of use of DME, Decreased range of motion, Decreased strength, Impaired UE functional use, Impaired tone  Visit Diagnosis: Unsteadiness on feet  Other abnormalities of gait and mobility  Muscle weakness (generalized)  Hemiplegia and hemiparesis following cerebral infarction affecting right non-dominant side (HCC)  Other lack  of coordination     Problem List Patient Active Problem List   Diagnosis Date Noted   CKD (chronic kidney disease) stage 2, GFR 60-89 ml/min 10/18/2020   COVID-19    Acute renal failure superimposed on stage 3a chronic kidney disease ([place])    Pneumonia due to COVID-19 virus 09/02/2020   Acute ischemic left middle cerebral artery (MCA) stroke ([place]) 05/11/2020   Cerebral infarction, watershed distribution, unilateral, acute ([place]) 05/11/2020   Dysarthria as late effect of cerebrovascular accident  (CVA) 05/11/2020   Visual field constriction, bilateral 05/11/2020   Hemiparesis affecting right side as late effect of cerebrovascular accident ([place]) 05/11/2020   Dysarthria, post-stroke 04/07/2020   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   Hyperglycemia    Anemia    Acute pain of right knee    Primary osteoarthritis of right knee    Expressive language impairment    Benign essential HTN    Acute bilat watershed infarction [facility]) 03/16/2020   Cerebral infarction, watershed distribution, unilateral, chronic 03/16/2020   Acute renal failure (HCC)    History of CVA in adulthood    Dyslipidemia    CVA (cerebral vascular accident) ([place]) 03/11/2020   Leukocytosis    Hemiparesis affecting dominant side as late effect of stroke ([place])    Labile blood pressure    Sleep disturbance    Benign hypertensive heart and kidney disease with diastolic CHF, NYHA class II and CKD stage III (HCC)    Chronic systolic congestive heart failure (HCC)    Morbid obesity ([place])    Uncontrolled hypertension    Dysphagia, post-stroke    Cardiomegaly    Acute systolic congestive heart failure (HCC)    Left middle cerebral artery stroke ([place]) 07/17/2019   Cerebral embolism with cerebral infarction 07/11/2019   Acute CVA (cerebrovascular accident) ([place]) 07/11/2019   Slurred speech 07/10/2019   Cardiomyopathy ([place]) 11/20/2018   History of stroke 09/06/2018   Tobacco use 09/06/2018   History of non-ST elevation myocardial infarction (NSTEMI) 05/05/2018   Severe Vitamin D deficiency 04/02/2018   Community acquired pneumonia of left lower lobe of lung 04/02/2018   CKD (chronic kidney disease), stage III ([place]) = Secondary to Hypertension 04/02/2018   Pneumonia 16/09/9603   Metabolic acidosis 54/08/8118   AKI (acute kidney injury) ([place]) 03/30/2018   N&V (nausea and vomiting) 03/30/2018   Essential hypertension 03/30/2018    Casson Catena April Ma L Carolene Gitto PT, DPT 06/05/2021, 11:10 AM  [facility] [address] [place], [place], [address] Phone: [phone]   Fax:  [phone]  Name: MAHLON GABRIELLE MRN: [id number] Date of Birth: [birth year]

## 2021-06-06 ENCOUNTER — Ambulatory Visit: Payer: Medicaid Other

## 2021-06-13 ENCOUNTER — Ambulatory Visit: Payer: Medicaid Other | Admitting: Physical Therapy

## 2021-06-16 DIAGNOSIS — Z419 Encounter for procedure for purposes other than remedying health state, unspecified: Secondary | ICD-10-CM | POA: Diagnosis not present

## 2021-06-22 ENCOUNTER — Other Ambulatory Visit: Payer: Self-pay

## 2021-06-22 ENCOUNTER — Encounter: Payer: Self-pay | Admitting: Physical Therapy

## 2021-06-22 ENCOUNTER — Ambulatory Visit: Payer: Medicaid Other | Attending: Adult Health | Admitting: Physical Therapy

## 2021-06-22 DIAGNOSIS — R2689 Other abnormalities of gait and mobility: Secondary | ICD-10-CM | POA: Insufficient documentation

## 2021-06-22 NOTE — Therapy (Signed)
[facility] [address] [place], [place], [address] Phone: [phone]   Fax:  [phone]  Physical Therapy Note - Pt arrived/ NO CHARGE  Patient Details  Name: Zachary Mccann MRN: [id number] Date of Birth: 04-19-1971 Referring Provider (PT): Frann Rider, NP   Encounter Date: 06/22/2021   PT End of Session - 06/22/21 0950     Visit Number 15   No charge for today's session per pt request to save remaining visits   Number of Visits 29    Date for PT Re-Evaluation 08/25/21    Authorization Type Medicaid 4/8-6/20 16 visits; Recert on 2/54/98 for 1x/week for 12 more sessions (06/02/21 to 08/25/21)    Authorization - Visit Number 13    Authorization - Number of Visits 16    PT Start Time 1020    PT Stop Time 1045    PT Time Calculation (min) 25 min    Equipment Utilized During Treatment Gait belt    Activity Tolerance Patient tolerated treatment well    Behavior During Therapy WFL for tasks assessed/performed             Past Medical History:  Diagnosis Date   CAD (coronary artery disease)    a. s/p NSTEMI in 04/2018 with DES to LCx.    Hypertension    Myocardial infarction ([place])    Pneumonia    Stroke [facility])     Past Surgical History:  Procedure Laterality Date   CORONARY STENT INTERVENTION N/A 05/05/2018   Procedure: CORONARY STENT INTERVENTION;  Surgeon: Leonie Man, MD;  Location: [place] [facility];  Service: Cardiovascular;  Laterality: N/A;   LEFT HEART CATH AND CORONARY ANGIOGRAPHY N/A 05/05/2018   Procedure: LEFT HEART CATH AND CORONARY ANGIOGRAPHY;  Surgeon: Leonie Man, MD;  Location: [place];  Service: Cardiovascular;  Laterality: N/A;   TEE WITHOUT CARDIOVERSION N/A 09/08/2018   Procedure: TRANSESOPHAGEAL ECHOCARDIOGRAM (TEE);  Surgeon: Larey Dresser, MD;  Location: [facility] ENDOSCOPY;  Service: Cardiovascular;  Laterality: N/A;    There were no vitals filed for this visit.    Subjective Assessment - 06/22/21 0947     Subjective Pt accompanied to PT by his girlfriend; pt c/o stomach pain and gas today; pt and girlfriend informed of authorization for only 6 visits until 12-16-21 -- pt states he wants to save these visits to be used later in the year -- pt declines PT today due to limited visits and c/o stomach pain    Patient is accompained by: [name] - girlfriend   Pertinent History history of hypertension, tobacco abuse, CAD with non-STEMI 04/2018 as well as left ICA occlusion July 2020 with associated CVA causing right side weakness and slurred speech. New watershed 3/21; Covid 9/21 - hospitalized 9-17- -09-04-20    Patient Stated Goals increase strength, be able to stand and walk as he was able to before Covid    Currently in Pain? Other (Comment)   stomach discomfort and gas   Pain Onset More than a month ago                                         PT Short Term Goals - 06/22/21 1009       PT SHORT TERM GOAL #1   Title Pt will be independent with initial HEP for strengthening and stretching.  Baseline HEP to be established    Time 4    Period Weeks    Status Achieved    Target Date 04/21/21      PT SHORT TERM GOAL #2   Title Pt will perform squat pivot transfers to level surface with min assist.    Baseline mod assist for squat pivot transfers; met 04-24-21    Time 4    Period Weeks    Status Achieved    Target Date 04/21/20      PT SHORT TERM GOAL #3   Title Pt will be able to perform sit to stand transfer from wheelchair to RW with min assist +1.    Baseline mod assist +2 from mat to RW; min to mod assist +1 on 04-24-21; 05/31/21 patient able to stand from [facility] with CGA afetr several attempts begining with MinA    Time 4    Period Weeks    Status Partially Met    Target Date 04/21/20      PT SHORT TERM GOAL #4   Title Pt will ambulate 18' with RW with +1 mod assist for improved household mobility.    Baseline 20'  with RW with mod assist (2nd person present for safety). 04/17/21 min assist up to 115' varying throughout treatment on distance as fatigues.    Time 4    Period Weeks    Status Achieved    Target Date 04/21/21      PT SHORT TERM GOAL #5   Title Assess need for Rt AFO and obtain orthotic consult if needed.    Baseline Assessment for AFO to be completed; AFO not needed - Foot up brace on RLE beneficial - 04-20-21    Time 4    Period Weeks    Status Achieved    Target Date 04/21/21               PT Long Term Goals - 06/22/21 1009       PT LONG TERM GOAL #1   Title Pt will perform sit to stand transfers with +1 min assist.    Baseline max assist; 05/31/21 MinA to CGA pending fatigue    Time 8    Period Weeks    Status Partially Met      PT LONG TERM GOAL #2   Title Pt will amb. with RW 100' with +1 min assist for household mobility.    Baseline 20' with mod assist with RW 05/31/21 Ambulation distance 79fwit RW and CGA x2 for safety    Time 8    Period Weeks    Status Partially Met      PT LONG TERM GOAL #3   Title Pt will be able to ambulate >100' CGA with RW for further improvement in household mobility.    Baseline 20' mod assist with RW;05/31/21 Ambulation distance 633fit RW and CGA x2 for safety    Time 8    Period Weeks    Status New      PT LONG TERM GOAL #4   Title Pt will be able to maintain standing x 5 min with supervision with light UE support on walker or counter for improved balance and participation with ADLs.    Baseline Bil. UE support on counter for 1" with min assist; 05/31/21 90s tolerance with SBA    Time 8    Period Weeks    Status On-going      PT LONG TERM GOAL #5  Title Caregiver will assist pt in performing updated HEP to assist with functional strengthening and stretching RLE.    Baseline Dependent; 05/31/21 No CG present today    Time 8    Period Weeks    Status On-going                   Plan - 06/22/21 1002     Clinical  Impression Statement Pt requests to hold remaining 6 authorized visits at this time, to be used later in the year ( Oct - Nov.) as girlfriend reports he has more difficulty with mobility in the cold weather.  Girlfriend was given information on speech eval to evaluate for augmentative communication device as she states he has difficulty spelling words and speech is slurred at times, making it difficult to understand.  Pt has only received 1 treatment session since LTG's were assessed on 06-02-21 and updated. No new LTG's achieved due to only 1 session attended.  D/C at this time per pt's request.    Personal Factors and Comorbidities Comorbidity 3+;Fitness;Time since onset of injury/illness/exacerbation    Comorbidities hypertension, tobacco abuse, CAD with non-STEMI 04/2018 as well as left ICA occlusion July 2020    Examination-Activity Limitations Bathing;Hygiene/Grooming;Stairs;Bed Mobility;Locomotion Level;Stand;Toileting;Transfers    Examination-Participation Restrictions Community Activity;Cleaning;Meal Prep;Driving;Shop;Laundry    Stability/Clinical Decision Making Stable/Uncomplicated    Rehab Potential Good    PT Frequency 1x / week    PT Duration 12 weeks    PT Treatment/Interventions ADLs/Self Care Home Management;Moist Heat;Electrical Stimulation;Gait training;DME Instruction;Functional mobility training;Stair training;Neuromuscular re-education;Balance training;Therapeutic exercise;Therapeutic activities;Patient/family education;Orthotic Fit/Training;Vestibular;Passive range of motion;Manual techniques;Wheelchair mobility training;Joint Manipulations    PT Next Visit Plan D/C per pt's request due to limited visits    PT Home Exercise Plan Seated marching, heel slide, LAQ. Standing weight shift L<>R.    Consulted and Agree with Plan of Care Patient;Other (Comment)             Patient will benefit from skilled therapeutic intervention in order to improve the following deficits and  impairments:  Abnormal gait, Decreased activity tolerance, Decreased balance, Decreased endurance, Decreased mobility, Decreased knowledge of use of DME, Decreased range of motion, Decreased strength, Impaired UE functional use, Impaired tone  Visit Diagnosis: Other abnormalities of gait and mobility     Problem List Patient Active Problem List   Diagnosis Date Noted   CKD (chronic kidney disease) stage 2, GFR 60-89 ml/min 10/18/2020   COVID-19    Acute renal failure superimposed on stage 3a chronic kidney disease ([place])    Pneumonia due to COVID-19 virus 09/02/2020   Acute ischemic left middle cerebral artery (MCA) stroke (HCC) 05/11/2020   Cerebral infarction, watershed distribution, unilateral, acute ([place]) 05/11/2020   Dysarthria as late effect of cerebrovascular accident (CVA) 05/11/2020   Visual field constriction, bilateral 05/11/2020   Hemiparesis affecting right side as late effect of cerebrovascular accident ([place]) 05/11/2020   Dysarthria, post-stroke 04/07/2020   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   Hyperglycemia    Anemia    Acute pain of right knee    Primary osteoarthritis of right knee    Expressive language impairment    Benign essential HTN    Acute bilat watershed infarction [facility]) 03/16/2020   Cerebral infarction, watershed distribution, unilateral, chronic 03/16/2020   Acute renal failure (HCC)    History of CVA in adulthood    Dyslipidemia    CVA (cerebral vascular accident) ([place]) 03/11/2020   Leukocytosis    Hemiparesis affecting dominant side as late  effect of stroke ([place])    Labile blood pressure    Sleep disturbance    Benign hypertensive heart and kidney disease with diastolic CHF, NYHA class II and CKD stage III (HCC)    Chronic systolic congestive heart failure (HCC)    Morbid obesity (HCC)    Uncontrolled hypertension    Dysphagia, post-stroke    Cardiomegaly    Acute systolic congestive heart failure (HCC)    Left middle cerebral artery stroke  ([place]) 07/17/2019   Cerebral embolism with cerebral infarction 07/11/2019   Acute CVA (cerebrovascular accident) ([place]) 07/11/2019   Slurred speech 07/10/2019   Cardiomyopathy ([place]) 11/20/2018   History of stroke 09/06/2018   Tobacco use 09/06/2018   History of non-ST elevation myocardial infarction (NSTEMI) 05/05/2018   Severe Vitamin D deficiency 04/02/2018   Community acquired pneumonia of left lower lobe of lung 04/02/2018   CKD (chronic kidney disease), stage III ([place]) = Secondary to Hypertension 04/02/2018   Pneumonia 02/54/8628   Metabolic acidosis 24/17/5301   AKI (acute kidney injury) ([place]) 03/30/2018   N&V (nausea and vomiting) 03/30/2018   Essential hypertension 03/30/2018    PHYSICAL THERAPY DISCHARGE SUMMARY  Visits from Start of Care:  15  Current functional level related to goals / functional outcomes: See above for progress/status towards goals   Remaining deficits: Continued Rt hemiparesis due to Lt CVA:  continued dependency with gait Continued decreased standing balance   Education / Equipment: Pt has been instructed in a HEP for RLE AROM and strengthening.  Patient agrees to discharge. Patient goals were not met. Patient is being discharged due to the patient's request.   Pt requests to hold PT at this time due to only 6 authorized visits remaining.    Alda Lea, PT 06/22/2021, 12:13 PM  [facility] [address] [place], [place], [address] Phone: [phone]   Fax:  [phone]  Name: Zachary Mccann MRN: [id number] Date of Birth: 1971/09/05

## 2021-06-26 ENCOUNTER — Other Ambulatory Visit: Payer: Self-pay

## 2021-06-26 ENCOUNTER — Ambulatory Visit: Payer: Medicaid Other | Admitting: Physical Therapy

## 2021-06-26 NOTE — Patient Outreach (Signed)
Meds due on 07/03/21  Ferrous Sulfate  324 mg  1    Vitamin D3   125 mcg    1  Hydralazine   25 mg  1.5  1.5  Carvedilol   6.25 mg  1  1  Amlodipine   10 mg  1    Clopidogrel   75 mg  1    Atorvastatin   40 mg    1    Spoke with niece who confirmed delivery. F/U 1 month, no changes from last month

## 2021-07-03 ENCOUNTER — Other Ambulatory Visit: Payer: Self-pay

## 2021-07-03 ENCOUNTER — Other Ambulatory Visit: Payer: Self-pay | Admitting: *Deleted

## 2021-07-03 ENCOUNTER — Ambulatory Visit: Payer: Medicaid Other | Admitting: Physical Therapy

## 2021-07-03 NOTE — Patient Instructions (Signed)
Visit Information  Zachary Mccann was given information about Medicaid Managed Care team care coordination services as a part of their [facility] Medicaid benefit. [name]  to engagement with the [facility] Managed Care team.   For questions related to your [facility] health plan, please call: [phone] or go here:[email or web address]  If you would like to schedule transportation through your [facility] plan, please call the following number at least 2 days in advance of your appointment: [phone].  Call the [facility] Crisis Line at [phone], at any time, 24 hours a day, 7 days a week. If you are in danger or need immediate medical attention call 911.  If you would like help to quit smoking, call 1-800-QUIT-NOW (629-124-0389) OR Espaol: 1-855-Djelo-Ya (6-712-458-0998) o para ms informacin haga clic aqu or Text READY to 338-250 to register via text  Zachary Mccann - following are the goals we discussed in your visit today:   Goals Addressed             This Visit's Progress    Keep or Improve My Strength-Stroke       Timeframe:  Long-Range Goal Priority:  High Start Date:   03/23/21                          Expected End Date:   07/03/21                    Follow Up Date 09/04/21   - attend 90 percent of physical therapy appointments - eat healthy to increase strength - increase activity or exercise time a little every week    Why is this important?   Before the stroke you probably did not think much about being safe when you are up and about.  Now, it may be harder for you to get around.  It may also be easier for you to trip or fall.  It is common to have muscle weakness after a stroke. You may also feel like you cannot control an arm or leg.  It will be helpful to work with a physical therapist to get your strength and muscle control back.  It is good to stay as active as you can. Walking and stretching help you stay strong and  flexible.  The physical therapist will develop an exercise program just for you.          Make and Keep All Appointments       Timeframe:  Long-Range Goal Priority:  High Start Date:   03/23/21                          Expected End Date: 07/03/21                      Follow Up Date 09/04/21    - call to reschedule missed appointment with PCP [phone] - call to reschedule [facility] appointment [phone] - call to cancel if needed - keep a calendar with prescription refill dates - keep a calendar with appointment dates  - call [phone], 2-3 days before appointment for medical transportation provided by [facility] - follow up with sleep study referral-discuss with Dr. Karilyn Cota at next appointment   Why is this important?   Part of staying healthy is seeing the doctor for follow-up care.  If you forget your appointments, there are some things you can do to stay on track.  Please see education materials related to iron rich foods provided by MyChart link. and as Financial risk analyst.   Patient verbalizes understanding of instructions provided today.   Telephone follow up appointment with Managed Medicaid care management team member scheduled for:09/04/21 @ 1pm  [name] RN, BSN [place]  [facility] RN Care Coordinator   Following is a copy of your plan of care:  Patient Care Plan: Stroke (Adult)     Problem Identified: Recurrence (Stroke)      Long-Range Goal: Stroke Recurrence Prevented or Minimized   Start Date: 03/23/2021  Expected End Date: 09/04/2021  Recent Progress: On track  Priority: High  Note:   Current Barriers:  Ineffective Self Health Maintenance-Zachary Mccann has a history of 2 strokes (2019 and 2021). He is managing his health with assistance of [name], his significant other. He recently restarted PT and has been referred for a sleep study. His next PCP appointment is 04/12/21. He has a wheelchair that  is in need of repair(missing leg rest, screws and the seat is falling apart). He is trying to improve his health and avoid another stroke. Zachary Mccann missed his PCP appointment due to family illness. 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 Town is visiting Yvette's family in [place]. [name] reports that they have everything and have a plan for getting needed medications. They will return to [facility] mid August.  Unable to perform ADLs independently Unable to perform IADLs independently Currently UNABLE TO independently self manage needs related to chronic health conditions.  Knowledge Deficits related to short term plan for care coordination needs and long term plans for chronic disease management needs Nurse Case Manager Clinical Goal(s):  patient will work with care management team to address care coordination and chronic disease management needs related to Disease Management Care Coordination   Interventions:  Evaluation of current treatment plan related to stroke history and patient's adherence to plan as established by provider. Provided education to patient re: iron rich foods-will send by mail Provided examples of iron rich foods Discussed plans with patient for ongoing care management follow up and provided patient with direct contact information for care management team Reviewed scheduled/upcoming provider appointments including: calling to reschedule missed PCP appointment [phone], and rescheduling [facility] [phone] Self Care Activities:  Patient will self administer medications as prescribed Patient will attend all scheduled provider appointments Patient will call pharmacy for medication refills Patient will call provider office for new concerns or questions Patient Goals: -- attend 90 percent of physical therapy appointments - eat healthy to increase strength - increase activity or exercise time a little  every week  - call to reschedule missed appointment with PCP [phone] - call to cancel if needed - keep a calendar with prescription refill dates - keep a calendar with appointment dates  - call [phone], 2-3 days before appointment for medical transportation provided by [facility] - follow up with sleep study referral-discuss with Dr. Karilyn Cota at next appointment Follow Up Plan: Telephone follow up appointment with care management team member scheduled for:09/04/21 @ 1pm      Patient Care Plan: Medication Management     Problem Identified: Health Promotion or Disease Self-Management (General Plan of Care)      Goal: Medication Management   Note:   Current Barriers:  Unable to self administer medications as prescribed Does not maintain contact with provider office Does not contact provider office for questions/concerns   Pharmacist Clinical Goal(s):  Over the next 30 days, patient will achieve adherence to monitoring guidelines and medication adherence to achieve therapeutic efficacy contact provider office for questions/concerns as evidenced notation of same in electronic health record through collaboration with PharmD and provider.    Interventions: Inter-disciplinary care team collaboration (see longitudinal plan of care) Comprehensive medication review performed; medication list updated in electronic medical record  @RXCPHYPERTENSION @ @RXCPHYPERLIPIDEMIA @  Patient Goals/Self-Care Activities Over the next 30 days, patient will:  - take medications as prescribed collaborate with provider on medication access solutions  Follow Up Plan: The care management team will reach out to the patient again over the next 30 days.

## 2021-07-03 NOTE — Patient Outreach (Signed)
Medicaid Managed Care   Nurse Care Manager Note  07/03/2021 Name:  Zachary Mccann MRN:  [id number] DOB:  02/23/1971  Zachary Mccann is an 50 y.o. year old male who is Mccann primary patient of Zachary Singer, MD.  The Medicaid Managed Care Coordination team was consulted for assistance with:    HTN Hx stroke  Mr. Mccann was given information about Medicaid Managed Care Coordination team services today. Zachary Mccann agreed to services and verbal consent obtained.  Engaged with patient by telephone for follow up visit in response to provider referral for case management and/or care coordination services.   Assessments/Interventions:  Review of past medical history, allergies, medications, health status, including review of consultants reports, laboratory and other test data, was performed as part of comprehensive evaluation and provision of chronic care management services.  SDOH (Social Determinants of Health) assessments and interventions performed: SDOH Interventions    Flowsheet Row Most Recent Value  SDOH Interventions   Food Insecurity Interventions Intervention Not Indicated  Housing Interventions Intervention Not Indicated  Transportation Interventions Intervention Not Indicated       Care Plan  Allergies  Allergen Reactions   Aspirin Swelling    Medications Reviewed Today     Reviewed by Zachary Dach, RN (Registered Nurse) on 07/03/21 at 1356  Med List Status: <None>   Medication Order Taking? Sig Documenting Provider Last Dose Status Informant  acetaminophen (TYLENOL) 325 MG tablet [id number] Yes Take 2 tablets (650 mg total) by mouth every 6 (six) hours as needed for mild pain (or Fever >/= 101). Zachary Amor, PA-Mccann Taking Active Spouse/Significant Other           Med Note Zachary Mccann, Zachary Mccann   Fri Sep 02, 2020  4:51 PM)    albuterol (VENTOLIN HFA) 108 (90 Base) MCG/ACT inhaler [id number] No Inhale 2 puffs into the lungs every 6 (six) hours as needed for  wheezing or shortness of breath.  Patient not taking: Reported on 07/03/2021   Zachary Blinks, MD Not Taking Active            Med Note (Alilah Mcmeans Mccann   Thu Mar 23, 2021  9:16 AM) Patient was unaware  amLODipine (NORVASC) 10 MG tablet [id number] Yes Take 1 tablet (10 mg total) by mouth daily. Zachary Singer, MD Taking Active   atorvastatin (LIPITOR) 40 MG tablet [id number] Yes TAKE 1 TABLET BY MOUTH ONCE DAILY AT  6  PM Zachary Mccann, Zachary C, MD Taking Active   carvedilol (COREG) 6.25 MG tablet [id number] Yes Take 1 tablet (6.25 mg total) by mouth 2 (two) times daily with Mccann meal. Zachary Mccann, Zachary C, MD Taking Active   Cholecalciferol (VITAMIN D-3) 125 MCG (5000 UT) TABS [id number] Yes Take 1 tablet by mouth daily. Zachary Singer, MD Taking Active   ciprofloxacin (CIPRO) 500 MG tablet [id number] No Take 1 tablet (500 mg total) by mouth 2 (two) times daily. For 7 days  Patient not taking: No sig reported   Zachary Paddy, NP Not Taking Active            Med Note (Zachary Mccann   Thu Mar 23, 2021  9:19 AM) completed  clopidogrel (PLAVIX) 75 MG tablet [id number] Yes Take 1 tablet (75 mg total) by mouth daily. Zachary Singer, MD Taking Active   guaiFENesin (MUCINEX) 600 MG 12 hr tablet [id number] No Take 1 tablet (600 mg total) by mouth 2 (two) times daily.  Patient not taking: Reported  on 07/03/2021   Zachary Blinks, MD Not Taking Active   hydrALAZINE (APRESOLINE) 25 MG tablet [id number] Yes Take 1 tablet (25 mg total) by mouth 3 (three) times daily. Zachary Singer, MD Taking Active   Melatonin 10 MG TABS [id number] Yes Take 10 mg by mouth at bedtime. Zachary Amor, PA-Mccann Taking Active Spouse/Significant Other           Med Note Zachary Mccann, Zachary Mccann   Fri Sep 02, 2020  4:51 PM)    nystatin (MYCOSTATIN/NYSTOP) powder [id number] Yes Apply 1 application topically 3 (three) times daily. Zachary Paddy, NP Taking Active   senna-docusate (SENOKOT-S) 8.6-50 MG tablet [id number] Yes Take 2 tablets by mouth  at bedtime. Zachary Paddy, NP Taking Active            Med Note Zachary Mccann, Zachary Mccann   Fri Sep 02, 2020  4:52 PM)    sildenafil (VIAGRA) 100 MG tablet [id number] No Take 0.5-1 tablets (50-100 mg total) by mouth daily as needed for erectile dysfunction.  Patient not taking: Reported on 07/03/2021   Zachary Paddy, NP Not Taking Active             Patient Active Problem List   Diagnosis Date Noted   CKD (chronic kidney disease) stage 2, GFR 60-89 ml/min 10/18/2020   COVID-19    Acute renal failure superimposed on stage 3a chronic kidney disease (HCC)    Pneumonia due to COVID-19 virus 09/02/2020   Acute ischemic left middle cerebral artery (MCA) stroke (HCC) 05/11/2020   Cerebral infarction, watershed distribution, unilateral, acute (HCC) 05/11/2020   Dysarthria as late effect of cerebrovascular accident (CVA) 05/11/2020   Visual field constriction, bilateral 05/11/2020   Hemiparesis affecting right side as late effect of cerebrovascular accident (HCC) 05/11/2020   Dysarthria, post-stroke 04/07/2020   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   Hyperglycemia    Anemia    Acute pain of right knee    Primary osteoarthritis of right knee    Expressive language impairment    Benign essential HTN    Acute bilat watershed infarction [facility]) 03/16/2020   Cerebral infarction, watershed distribution, unilateral, chronic 03/16/2020   Acute renal failure (HCC)    History of CVA in adulthood    Dyslipidemia    CVA (cerebral vascular accident) (HCC) 03/11/2020   Leukocytosis    Hemiparesis affecting dominant side as late effect of stroke (HCC)    Labile blood pressure    Sleep disturbance    Benign hypertensive heart and kidney disease with diastolic CHF, NYHA class II and CKD stage III (HCC)    Chronic systolic congestive heart failure (HCC)    Morbid obesity (HCC)    Uncontrolled hypertension    Dysphagia, post-stroke    Cardiomegaly    Acute systolic congestive heart failure (HCC)    Left  middle cerebral artery stroke (HCC) 07/17/2019   Cerebral embolism with cerebral infarction 07/11/2019   Acute CVA (cerebrovascular accident) (HCC) 07/11/2019   Slurred speech 07/10/2019   Cardiomyopathy (HCC) 11/20/2018   History of stroke 09/06/2018   Tobacco use 09/06/2018   History of non-ST elevation myocardial infarction (NSTEMI) 05/05/2018   Severe Vitamin D deficiency 04/02/2018   Community acquired pneumonia of left lower lobe of lung 04/02/2018   CKD (chronic kidney disease), stage III (HCC) = Secondary to Hypertension 04/02/2018   Pneumonia 03/30/2018   Metabolic acidosis 03/30/2018   AKI (acute kidney injury) (HCC) 03/30/2018   N&V (nausea and vomiting) 03/30/2018  Essential hypertension 03/30/2018    Conditions to be addressed/monitored per PCP order:  HTN and hx stroke  Care Plan : Stroke (Adult)  Updates made by Zachary Dach, RN since 07/03/2021 12:00 AM     Problem: Recurrence (Stroke)      Long-Range Goal: Stroke Recurrence Prevented or Minimized   Start Date: 03/23/2021  Expected End Date: 09/04/2021  Recent Progress: On track  Priority: High  Note:   Current Barriers:  Ineffective Self Health Maintenance-Mr. Shaul has Mccann history of 2 strokes (2019 and 2021). He is managing his health with assistance of [name], his significant other. He recently restarted PT and has been referred for Mccann sleep study. His next PCP appointment is 04/12/21. He has Mccann wheelchair that is in need of repair(missing leg rest, screws and the seat is falling apart). He is trying to improve his health and avoid another stroke. Mr. Candee missed his PCP appointment due to family illness. 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 Haroon is visiting Yvette's family in [place]. [name] reports that they have everything and have Mccann plan for getting needed medications. They will return to [facility] mid August.  Unable to perform ADLs  independently Unable to perform IADLs independently Currently UNABLE TO independently self manage needs related to chronic health conditions.  Knowledge Deficits related to short term plan for care coordination needs and long term plans for chronic disease management needs Nurse Case Manager Clinical Goal(s):  patient will work with care management team to address care coordination and chronic disease management needs related to Disease Management Care Coordination   Interventions:  Evaluation of current treatment plan related to stroke history and patient's adherence to plan as established by provider. Provided education to patient re: iron rich foods-will send by mail Provided examples of iron rich foods Discussed plans with patient for ongoing care management follow up and provided patient with direct contact information for care management team Reviewed scheduled/upcoming provider appointments including: calling to reschedule missed PCP appointment [phone], and rescheduling [facility] [phone] Self Care Activities:  Patient will self administer medications as prescribed Patient will attend all scheduled provider appointments Patient will call pharmacy for medication refills Patient will call provider office for new concerns or questions Patient Goals: -- attend 90 percent of physical therapy appointments - eat healthy to increase strength - increase activity or exercise time Mccann little every week  - call to reschedule missed appointment with PCP [phone] - call to cancel if needed - keep Mccann calendar with prescription refill dates - keep Mccann calendar with appointment dates  - call [phone], 2-3 days before appointment for medical transportation provided by [facility] - follow up with sleep study referral-discuss with Dr. Karilyn Cota at next appointment Follow Up Plan: Telephone follow up appointment with care management team member scheduled for:09/04/21 @ 1pm        Follow Up:  Patient agrees to Care Plan and Follow-up.  Plan: The Managed Medicaid care management team will reach out to the patient again over the next 60 days.  Date/time of next scheduled RN care management/care coordination outreach:  09/04/21 @ 1pm  [name] RN, BSN [place]  [facility]

## 2021-07-10 ENCOUNTER — Ambulatory Visit: Payer: Medicaid Other

## 2021-07-12 ENCOUNTER — Ambulatory Visit (INDEPENDENT_AMBULATORY_CARE_PROVIDER_SITE_OTHER): Payer: Medicaid Other | Admitting: Adult Health

## 2021-07-12 ENCOUNTER — Encounter: Payer: Self-pay | Admitting: Adult Health

## 2021-07-12 NOTE — Patient Instructions (Signed)
Ensure you continue to do exercises at home as advised by therapy and plan on restarting later this year -if you need assistance with restarting, please let me know  Continue clopidogrel 75 mg daily  and atorvastatin for secondary stroke prevention  Continue to follow up with PCP regarding cholesterol and blood pressure management  Maintain strict control of hypertension with blood pressure goal below 130/90 and cholesterol with LDL cholesterol (bad cholesterol) goal below 70 mg/dL.       Followup in the future with me in 6 months or call earlier if needed       Thank you for coming to see [place] at [facility]. I hope we have been able to provide you high quality care today.  You may receive a patient satisfaction survey over the next few weeks. We would appreciate your feedback and comments so that we may continue to improve ourselves and the health of our patients.

## 2021-07-12 NOTE — Progress Notes (Deleted)
[facility] [address] [place]. [place] [address] [phone]       STROKE FOLLOW UP NOTE  [name] Date of Birth:  September 28, 1971 Medical Record Number:  [id number]   Reason for Referral: stroke follow up    CHIEF COMPLAINT:  No chief complaint on file.   HPI:  Today, 07/12/2021, [name] returns for 29-month stroke follow-up.  He has been stable from stroke standpoint without new stroke/TIA symptoms.  Reports residual right hemiparesis and dysarthria.  Last PT visit 7/7 with placing remaining visits on hold until later in the year as he has more difficulty with mobility in the colder weather.  Reports continued exercises at home.  Reports compliance on Plavix and atorvastatin without associated side effects.  Blood pressure today ***.       History provided for reference purposes only Update 01/10/2021 JM: [name] returns for stroke follow-up accompanied by his fiance.  Unfortunately, shortly after prior visit he was diagnosed with pneumonia secondary to COVID-19 infection on 09/02/2020 requiring a 2-day admission.  He has a reports limited activity and exercise for roughly 1 month around that time and has since had greater difficulty standing and transfers only via wheelchair due to post stroke right hemiparesis.  Dysarthria has been stable without worsening.  Denies new stroke/TIA symptoms.  He has remained on Plavix and atorvastatin for secondary stroke prevention of side effects.  Blood pressure today 137/91.  No further concerns at this time  Update 08/15/2020 JM: [name] is being seen for stroke follow-up accompanied by his girlfriend History of left MCA and ACA scattered infarcts secondary to left ICA occlusion 07/10/2019 with residual right hemiparesis and expressive aphasia History of left cerebellar stroke 08/2018 with residual left-sided deficits Evaluated by Dr. Vickey Huger on 05/11/2020 for progressive deep watershed ischemia and  development of left-sided Wallerian degeneration on 03/11/2020 with residual dysarthria and increased right hemiparesis discharged from OT on 07/05/2020 due to meeting rehab potential Discharge from SLP on 05/25/2020 due to limited visits and wanting to focus more on strengthening and ambulation It was recommended to continue working with PT but per epic review, he has not had consistent follow-up Residual deficits of right hemiparesis, severe dysarthria and verbal apraxia -he does report ongoing improvement Is able to transfer himself from bed to chair. Able to stand to put clothes on - his gf assists him further Right leg tremors upon standing which has been persistent and denies worsening Speech improving - more understandable per gf Denies new or worsening stroke/TIA symptoms Remains on clopidogrel and atorvastatin for secondary stroke prevention without side effects Blood pressure today 132/88 Continues to follow closely with PCP for HTN and HLD management Concerned regarding bilateral lower extremity edema No further concerns at this time  Update 05/11/2020 Dr. Vickey Huger: He was readmitted as a new stroke on 16 March 2020 he presented with severe dysarthria profound right-sided weakness, as a code stroke on the 26 of 2021 head CT was obtained and complaint compared to the previous 12 it showed no acute infarct at first.  There was a positive finding of a progressive deep watershed ischemia and development of left-sided wallerian degeneration since July of the previous year 2020.  Chronic multifocal vessel occlusion and small vessel disease as well.  The CT redemonstrated findings from previously a brain MRI was ordered to make sure that there was no acute change.   The MRI showed no acute infarct but deep watershed ischemia this was a progressive  finding the extent of watershed ischemia in comparison to July 2020, small vessel disease.  The patient also had mild aphasia, he did not have headaches, no  longer has diplopia his dysarthria has made it difficult for him to speak but he has no problems comprehending speech.  This time his right leg is the most weak. All pointing to the left middle cerebral artery.    Hospital follow-up 11/26/2019 JM: [name] is a 50 year old male who is being seen today for hospital follow-up.  Residual deficits from most recent stroke include right hemiparesis, and severe expressive aphasia without improvement.  He denies residual dysphagia concerns.  He also continues to have residual left hemiparesis from prior stroke.  He is primarily nonverbal but is able to yes and no appropriately.  Difficulty obtaining large amount of information due to communication barrier but able to ask yes/no questions with appropriate responses.  After inpatient rehab discharge, he was recommended for patient to receive home health therapies but he denies receiving any type of therapies at this time.  He is currently living in his own home with his girlfriend who helps assist with daily needs.  He is nonambulatory and transfers via wheelchair.  He has continued on Plavix without bleeding or bruising.  Has continued on atorvastatin 40 mg daily without myalgias.  Blood pressure today 138/74.  He continues to follow with cardiology regularly.  No further concerns at this time.  Stroke admission summary [name] is a 50 y.o. male with history of ongoing tobacco use, CAD w stent / Mi, hypertension, previous left cerebellar stroke with residual left-sided deficits 08/2018 and non compliant with medications after losing his insurance coverage, who was found to be much more dysarthric with right facial droop  and right-sided weakness.  Stroke work-up revealed left MCA and ACA scattered infarcts as evidenced on MRI due to left ICA occlusion (large vessel disease).  He did not receive IV t-PA due to late presentation (>4.5 hours from time of onset).  MRI showed extensive left hemisphere  acute/subacute white matter infarcts involving the left centrum semiovale and corona radiata as well as border zone areas.  MRA showed left ICA occlusion, bilateral VA distal occlusion, BA proximal occlusion with distal retrograde recon from PCOMs.  Carotid Doppler showed left ICA/CCA occlusion.  2D echo showed an EF of 40%.  LDL 90.  A1c 5.6.  UDS positive for THC.  Recommended initiation of Plavix as he has a history of aspirin allergy.  Prior stroke 08/2018 left superior cerebellar infarct as well as evidence of prior strokes.  Hypercoagulable work-up at that time negative.  TEE unremarkable.  30 cardiac event monitor outpatient negative for A. fib.  History of CAD with cardiomyopathy non-STEMI post stent 04/2018 and medication noncompliance due to financial difficulty.  Current tobacco use of smoking cessation counseling provided.  HTN stable and recommended long-term BP goal 130-150 due to left ICA occlusion.  Initiated atorvastatin 40 mg daily.  Other stroke risk factors include intracranial stenosis, previous EtOH use and prior drug use, THC use, obesity, family history of stroke and medication noncompliance.  Residual deficits of dysphagia, expressive aphasia and right hemiparesis and discharged to CIR for ongoing therapies.  He was discharged home with recommendation of ongoing therapies on 08/12/2019 with residual left hemiparesis, right hemiparesis, dysphagia and aphasia.       ROS:   14 system review of systems performed and negative with exception of speech difficulty, gait impairment, weakness  PMH:  Past Medical History:  Diagnosis Date   CAD (coronary artery disease)    a. s/p NSTEMI in 04/2018 with DES to LCx.    Hypertension    Myocardial infarction (HCC)    Pneumonia    Stroke (HCC)     PSH:  Past Surgical History:  Procedure Laterality Date   CORONARY STENT INTERVENTION N/A 05/05/2018   Procedure: CORONARY STENT INTERVENTION;  Surgeon: Marykay LexHarding, David W, MD;  [facility]VAS[facility] CV LAB;  Service: Cardiovascular;  Laterality: N/A;   LEFT HEART CATH AND CORONARY ANGIOGRAPHY N/A 05/05/2018   Procedure: LEFT HEART CATH AND CORONARY ANGIOGRAPHY;  Surgeon: Marykay LexHarding, David W, MD;  [facility];  Service: Cardiovascular;  Laterality: N/A;   TEE WITHOUT CARDIOVERSION N/A 09/08/2018   Procedure: TRANSESOPHAGEAL ECHOCARDIOGRAM (TEE);  Surgeon: Laurey MoraleMcLean, Dalton S, MD;  [facility];  Service: Cardiovascular;  Laterality: N/A;    Social History:  Social History   Socioeconomic History   Marital status: Single    Spouse name: Not on file   Number of children: Not on file   Years of education: Not on file   Highest education level: Not on file  Occupational History   Not on file  Tobacco Use   Smoking status: Former    Types: Cigarettes   Smokeless tobacco: Never   Tobacco comments:    smoked for 10-20 years at 1/2 ppd as of 2019  Vaping Use   Vaping Use: Never used  Substance and Sexual Activity   Alcohol use: Not Currently   Drug use: Not Currently   Sexual activity: Yes  Other Topics Concern   Not on file  Social History Narrative   12th grade.On short-term disability 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 Has girlfriend. Lives with girlfriend.   Social Determinants of Health   Financial Resource Strain: Not on file  Food Insecurity: No Food Insecurity   Worried About Programme researcher, broadcasting/film/videounning Out of Food in the Last Year: Never true   Ran Out of Food in the Last Year: Never true  Transportation Needs: No Transportation Needs   Lack of Transportation (Medical): No   Lack of Transportation (Non-Medical): No  Physical Activity: Not on file  Stress: Not on file  Social Connections: Not on file  Intimate Partner Violence: Not on file    Family History:  Family History  Problem Relation Age of Onset   Hypertension Mother    Stroke Mother    Dementia Mother    Hypertension Father    Stroke Father    Cancer Father    Alcoholism  Father    Cancer Sister     Medications:   Current Outpatient Medications on File Prior to Visit  Medication Sig Dispense Refill   acetaminophen (TYLENOL) 325 MG tablet Take 2 tablets (650 mg total) by mouth every 6 (six) hours as needed for mild pain (or Fever >/= 101).     albuterol (VENTOLIN HFA) 108 (90 Base) MCG/ACT inhaler Inhale 2 puffs into the lungs every 6 (six) hours as needed for wheezing or shortness of breath. (Patient not taking: Reported on 07/03/2021) 8 g 2   amLODipine (NORVASC) 10 MG tablet Take 1 tablet (10 mg total) by mouth daily. 90 tablet 1   atorvastatin (LIPITOR) 40 MG tablet TAKE 1 TABLET BY MOUTH ONCE DAILY AT  6  PM 90 tablet 1   carvedilol (COREG) 6.25 MG tablet Take 1 tablet (6.25 mg total) by mouth 2 (two) times daily with  a meal. 180 tablet 1   Cholecalciferol (VITAMIN D-3) 125 MCG (5000 UT) TABS Take 1 tablet by mouth daily. 90 tablet 1   ciprofloxacin (CIPRO) 500 MG tablet Take 1 tablet (500 mg total) by mouth 2 (two) times daily. For 7 days (Patient not taking: No sig reported) 14 tablet 0   clopidogrel (PLAVIX) 75 MG tablet Take 1 tablet (75 mg total) by mouth daily. 90 tablet 1   guaiFENesin (MUCINEX) 600 MG 12 hr tablet Take 1 tablet (600 mg total) by mouth 2 (two) times daily. (Patient not taking: Reported on 07/03/2021) 60 tablet 2   hydrALAZINE (APRESOLINE) 25 MG tablet Take 1 tablet (25 mg total) by mouth 3 (three) times daily. 270 tablet 1   Melatonin 10 MG TABS Take 10 mg by mouth at bedtime. 30 tablet 0   nystatin (MYCOSTATIN/NYSTOP) powder Apply 1 application topically 3 (three) times daily. 15 g 2   senna-docusate (SENOKOT-S) 8.6-50 MG tablet Take 2 tablets by mouth at bedtime.     sildenafil (VIAGRA) 100 MG tablet Take 0.5-1 tablets (50-100 mg total) by mouth daily as needed for erectile dysfunction. (Patient not taking: Reported on 07/03/2021) 5 tablet 3   No current facility-administered medications on file prior to visit.    Allergies:    Allergies  Allergen Reactions   Aspirin Swelling     Physical Exam  There were no vitals filed for this visit.  There is no height or weight on file to calculate BMI. No results found.  General: Pleasant morbidly obese middle-aged African-American male, seated, in no evident distress Head: head normocephalic and atraumatic.   Neck: supple with no carotid or supraclavicular bruits Cardiovascular: regular rate and rhythm, no murmurs Musculoskeletal: no deformity Skin:  no rash/petichiae Vascular:  Normal pulses all extremities   Neurologic Exam Mental Status: Awake and fully alert.  Severe dysarthria.  Attempts at speaking in short sentences with great difficulty understanding.  Able to follow commands without difficulty.  Oriented to place and time with choices and nodding yes/no appropriately to questions.  Unable to fully assess cognition due to communication deficit but appears intact.  Mood and affect appropriate.  Cranial Nerves: Pupils equal, briskly reactive to light. Extraocular movements full without nystagmus. Visual fields full to confrontation. Hearing intact. Facial sensation intact.  Right lower facial droop. Motor: Normal bulk and tone.  RUE: 4/5 with slightly decreased grip strengths and increased tone RLE: 3/5 hip flexor; 4/5 knee extension and flexion; ankle contraction with limited movement LUE: 5/5 LLE: 4/5 hip flexor and ankle dorsiflexion Sensory.: intact to touch , pinprick , position and vibratory sensation.  Coordination: Rapid alternating movements diminished on right. Finger-to-nose performed accurately LUE and heel-to-shin unable to perform adequately due to weakness Gait and Station: Deferred  Reflexes: 1+ and symmetric. Toes downgoing.       ASSESSMENT: Zachary Mccann is a 50 y.o. year old male with left MCA and ACA scattered infarcts on 07/10/2019 secondary to large vessel disease with left ICA occlusion as well as progressive deep watershed  ischemia and development of Dr. Lucia Gaskins degeneration on 03/11/2020. Vascular risk factors include tobacco use, CAD with cardiomyopathy and MI post stent, prior stroke 2019 with residual left hemiparesis, HTN, HLD and medication noncompliance due to loss of insurance.  Of note, diagnosed with COVID-19 09/02/2020   PLAN:  Left MCA/ACA infarcts:  Residual deficits: Right spastic hemiparesis and severe dysarthria.  Likely deconditioning from lack of exercise and physical activity while he was recovering  from COVID-19.  Referral placed to restart PT and home rehab.  Will likely benefit from AFO brace for RLE with HEP deferred to PT.  He declines interest in OT or SLP at this time Continue clopidogrel 75 mg daily  and atorvastatin for secondary stroke prevention.  Discussed secondary stroke prevention measures and importance of close PCP follow-up for aggressive stroke risk factor management HTN: BP goal<130/90.  Well-controlled monitor by PCP HLD: LDL goal<70.  LDL 84 (10/2020)  Remains on atorvastatin 40 mg daily per PCP Tobacco use: Endorses cessation and highly encouraged continuation CAD: Continue to follow with cardiology for ongoing monitoring and management Suspected sleep apnea: needs to schedule sleep study. Will reach out to Dr. Vickey Huger in regards to pursuing sleep study    Follow up in 4 months or call earlier if needed   CC:  [facility] provider: Dr. Peyton Najjar, Nimish C, MD    I spent 30 minutes of face-to-face and non-face-to-face time with patient and wife.  This included previsit chart review, lab review, study review, order entry, electronic health record documentation, patient education regarding history of prior stroke with residual deficits, importance of managing stroke risk factors, and answered all questions to patient satisfaction   Ihor Austin, [facility]  [facility] [address] [place], [place] [address]  Phone [phone] Fax  [phone] Note: This document was prepared with digital dictation and possible smart phrase technology. Any transcriptional errors that result from this process are unintentional.

## 2021-07-17 ENCOUNTER — Ambulatory Visit: Payer: Medicaid Other

## 2021-07-17 DIAGNOSIS — Z419 Encounter for procedure for purposes other than remedying health state, unspecified: Secondary | ICD-10-CM | POA: Diagnosis not present

## 2021-07-24 ENCOUNTER — Ambulatory Visit: Payer: Medicaid Other

## 2021-07-31 ENCOUNTER — Ambulatory Visit: Payer: Medicaid Other

## 2021-08-07 ENCOUNTER — Ambulatory Visit: Payer: Medicaid Other

## 2021-08-10 DIAGNOSIS — I129 Hypertensive chronic kidney disease with stage 1 through stage 4 chronic kidney disease, or unspecified chronic kidney disease: Secondary | ICD-10-CM | POA: Diagnosis not present

## 2021-08-10 DIAGNOSIS — D638 Anemia in other chronic diseases classified elsewhere: Secondary | ICD-10-CM | POA: Diagnosis not present

## 2021-08-10 DIAGNOSIS — R809 Proteinuria, unspecified: Secondary | ICD-10-CM | POA: Diagnosis not present

## 2021-08-10 DIAGNOSIS — N1831 Chronic kidney disease, stage 3a: Secondary | ICD-10-CM | POA: Diagnosis not present

## 2021-08-10 DIAGNOSIS — I5042 Chronic combined systolic (congestive) and diastolic (congestive) heart failure: Secondary | ICD-10-CM | POA: Diagnosis not present

## 2021-08-14 ENCOUNTER — Ambulatory Visit: Payer: Medicaid Other

## 2021-08-14 DIAGNOSIS — Z0271 Encounter for disability determination: Secondary | ICD-10-CM

## 2021-08-17 DIAGNOSIS — Z419 Encounter for procedure for purposes other than remedying health state, unspecified: Secondary | ICD-10-CM | POA: Diagnosis not present

## 2021-08-22 ENCOUNTER — Ambulatory Visit: Payer: Medicaid Other

## 2021-09-04 ENCOUNTER — Other Ambulatory Visit: Payer: Self-pay | Admitting: *Deleted

## 2021-09-04 NOTE — Patient Outreach (Signed)
Care Coordination  09/04/2021  Zachary Mccann [birth year] [id number]   Medicaid Managed Care   Unsuccessful Outreach Note  09/04/2021 Name: Zachary Mccann MRN: [id number] DOB: 1971-10-23  Referred by: No primary care provider on file. Reason for referral : High Risk Managed Medicaid (Unsuccessful RNCM follow up outreach)   An unsuccessful telephone outreach was attempted today. The patient was referred to the case management team for assistance with care management and care coordination.   Follow Up Plan: A HIPAA compliant phone message was left for the patient providing contact information and requesting a return call.   [name] RN, BSN [place]  [employer]

## 2021-09-04 NOTE — Patient Instructions (Signed)
Visit Information  [name]  - as a part of your Medicaid benefit, you are eligible for care management and care coordination services at no cost or copay. I was unable to reach you by phone today but would be happy to help you with your health related needs. Please feel free to call me @ [phone].   A member of the Managed Medicaid care management team will reach out to you again over the next 14 days.   Zachary Emms RN, BSN [place]  [employer]

## 2021-09-13 ENCOUNTER — Other Ambulatory Visit: Payer: Self-pay

## 2021-09-13 ENCOUNTER — Emergency Department (HOSPITAL_COMMUNITY)
Admission: EM | Admit: 2021-09-13 | Discharge: 2021-09-13 | Disposition: A | Discharge status: Home / Self Care | Payer: Medicaid Other | Attending: Emergency Medicine | Admitting: Emergency Medicine

## 2021-09-13 ENCOUNTER — Encounter (HOSPITAL_COMMUNITY): Payer: Self-pay | Admitting: Emergency Medicine

## 2021-09-13 ENCOUNTER — Emergency Department (HOSPITAL_COMMUNITY): Payer: Medicaid Other

## 2021-09-13 DIAGNOSIS — I130 Hypertensive heart and chronic kidney disease with heart failure and stage 1 through stage 4 chronic kidney disease, or unspecified chronic kidney disease: Secondary | ICD-10-CM | POA: Diagnosis not present

## 2021-09-13 DIAGNOSIS — I5043 Acute on chronic combined systolic (congestive) and diastolic (congestive) heart failure: Secondary | ICD-10-CM | POA: Insufficient documentation

## 2021-09-13 DIAGNOSIS — B954 Other streptococcus as the cause of diseases classified elsewhere: Secondary | ICD-10-CM | POA: Diagnosis not present

## 2021-09-13 DIAGNOSIS — Z79899 Other long term (current) drug therapy: Secondary | ICD-10-CM | POA: Insufficient documentation

## 2021-09-13 DIAGNOSIS — N1831 Chronic kidney disease, stage 3a: Secondary | ICD-10-CM | POA: Insufficient documentation

## 2021-09-13 DIAGNOSIS — R310 Gross hematuria: Secondary | ICD-10-CM

## 2021-09-13 DIAGNOSIS — I700 Atherosclerosis of aorta: Secondary | ICD-10-CM | POA: Diagnosis not present

## 2021-09-13 DIAGNOSIS — Z87891 Personal history of nicotine dependence: Secondary | ICD-10-CM | POA: Diagnosis not present

## 2021-09-13 DIAGNOSIS — I2510 Atherosclerotic heart disease of native coronary artery without angina pectoris: Secondary | ICD-10-CM | POA: Diagnosis not present

## 2021-09-13 DIAGNOSIS — K5900 Constipation, unspecified: Secondary | ICD-10-CM | POA: Insufficient documentation

## 2021-09-13 DIAGNOSIS — R059 Cough, unspecified: Secondary | ICD-10-CM | POA: Diagnosis not present

## 2021-09-13 DIAGNOSIS — N390 Urinary tract infection, site not specified: Secondary | ICD-10-CM | POA: Insufficient documentation

## 2021-09-13 DIAGNOSIS — I517 Cardiomegaly: Secondary | ICD-10-CM | POA: Diagnosis not present

## 2021-09-13 DIAGNOSIS — R1030 Lower abdominal pain, unspecified: Secondary | ICD-10-CM | POA: Diagnosis not present

## 2021-09-13 DIAGNOSIS — E669 Obesity, unspecified: Secondary | ICD-10-CM | POA: Insufficient documentation

## 2021-09-13 DIAGNOSIS — Z7902 Long term (current) use of antithrombotics/antiplatelets: Secondary | ICD-10-CM | POA: Insufficient documentation

## 2021-09-13 DIAGNOSIS — R319 Hematuria, unspecified: Secondary | ICD-10-CM | POA: Diagnosis not present

## 2021-09-13 LAB — CBC WITH DIFFERENTIAL/PLATELET
Abs Immature Granulocytes: 0.02 10*3/uL (ref 0.00–0.07)
Basophils Absolute: 0 10*3/uL (ref 0.0–0.1)
Basophils Relative: 1 %
Eosinophils Absolute: 0.3 10*3/uL (ref 0.0–0.5)
Eosinophils Relative: 3 %
HCT: 40.3 % (ref 39.0–52.0)
Hemoglobin: 12.9 g/dL — ABNORMAL LOW (ref 13.0–17.0)
Immature Granulocytes: 0 %
Lymphocytes Relative: 20 %
Lymphs Abs: 1.7 10*3/uL (ref 0.7–4.0)
MCH: 30.8 pg (ref 26.0–34.0)
MCHC: 32 g/dL (ref 30.0–36.0)
MCV: 96.2 fL (ref 80.0–100.0)
Monocytes Absolute: 0.6 10*3/uL (ref 0.1–1.0)
Monocytes Relative: 8 %
Neutro Abs: 5.9 10*3/uL (ref 1.7–7.7)
Neutrophils Relative %: 68 %
Platelets: 281 10*3/uL (ref 150–400)
RBC: 4.19 MIL/uL — ABNORMAL LOW (ref 4.22–5.81)
RDW: 13.1 % (ref 11.5–15.5)
WBC: 8.5 10*3/uL (ref 4.0–10.5)
nRBC: 0 % (ref 0.0–0.2)

## 2021-09-13 LAB — BASIC METABOLIC PANEL
Anion gap: 8 (ref 5–15)
BUN: 22 mg/dL — ABNORMAL HIGH (ref 6–20)
CO2: 25 mmol/L (ref 22–32)
Calcium: 9 mg/dL (ref 8.9–10.3)
Chloride: 104 mmol/L (ref 98–111)
Creatinine, Ser: 1.65 mg/dL — ABNORMAL HIGH (ref 0.61–1.24)
GFR, Estimated: 50 mL/min — ABNORMAL LOW (ref >60)
Glucose, Bld: 110 mg/dL — ABNORMAL HIGH (ref 70–99)
Potassium: 4 mmol/L (ref 3.5–5.1)
Sodium: 137 mmol/L (ref 135–145)

## 2021-09-13 LAB — URINALYSIS, ROUTINE W REFLEX MICROSCOPIC
Bilirubin Urine: NEGATIVE
Glucose, UA: NEGATIVE mg/dL
Ketones, ur: NEGATIVE mg/dL
Nitrite: NEGATIVE
Protein, ur: 30 mg/dL — AB
RBC / HPF: >50 RBC/hpf — ABNORMAL HIGH (ref 0–5)
Specific Gravity, Urine: 1.014 (ref 1.005–1.030)
WBC, UA: >50 WBC/hpf — ABNORMAL HIGH (ref 0–5)
pH: 5 (ref 5.0–8.0)

## 2021-09-13 MED ORDER — SULFAMETHOXAZOLE-TRIMETHOPRIM 800-160 MG PO TABS: 1.0000 | ORAL_TABLET | Freq: Two times a day (BID) | ORAL | 0 refills | Status: AC

## 2021-09-13 NOTE — ED Provider Notes (Signed)
[facility] EMERGENCY DEPARTMENT Provider Note   CSN: [id number] Arrival date & time: 09/13/21  3267     History Chief Complaint  Patient presents with   Hematuria    Zachary Mccann is a 50 y.o. male.  He has a past medical history of stroke which leaves him with some slurred speech and right-sided motor deficits.  History provided by patient and his significant other.  He is usually continent of urine although has difficulty getting to the bathroom and so wife will assist with using a urinal.  Today she noticed some blood in his diaper and when he urinated he passed some clots.  He is complaining of dysuria.  He says this is happened before although wife denies any knowledge of that.  He denies any headache chest pain shortness of breath abdominal pain vomiting diarrhea.  Has some chronic constipation.  Has not seen any bright red blood per rectum.  Has had a cough with some productive sputum although cannot tell me the color.  Wife said he has had a chronic cough since his strokes.  No testicular pain  The history is provided by the patient and the spouse.  Hematuria This is a new problem. The current episode started 1 to 2 hours ago. The problem has not changed since onset.Pertinent negatives include no chest pain, no abdominal pain, no headaches and no shortness of breath. Nothing aggravates the symptoms. Nothing relieves the symptoms. He has tried nothing for the symptoms. The treatment provided no relief.      Past Medical History:  Diagnosis Date   CAD (coronary artery disease)    a. s/p NSTEMI in 04/2018 with DES to LCx.    Hypertension    Myocardial infarction (HCC)    Pneumonia    Stroke [facility])     Patient Active Problem List   Diagnosis Date Noted   CKD (chronic kidney disease) stage 2, GFR 60-89 ml/min 10/18/2020   COVID-19    Acute renal failure superimposed on stage 3a chronic kidney disease (HCC)    Pneumonia due to COVID-19 virus 09/02/2020   Acute ischemic left  middle cerebral artery (MCA) stroke (HCC) 05/11/2020   Cerebral infarction, watershed distribution, unilateral, acute (HCC) 05/11/2020   Dysarthria as late effect of cerebrovascular accident (CVA) 05/11/2020   Visual field constriction, bilateral 05/11/2020   Hemiparesis affecting right side as late effect of cerebrovascular accident (HCC) 05/11/2020   Dysarthria, post-stroke 04/07/2020   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   Hyperglycemia    Anemia    Acute pain of right knee    Primary osteoarthritis of right knee    Expressive language impairment    Benign essential HTN    Acute bilat watershed infarction [facility]) 03/16/2020   Cerebral infarction, watershed distribution, unilateral, chronic 03/16/2020   Acute renal failure (HCC)    History of CVA in adulthood    Dyslipidemia    CVA (cerebral vascular accident) (HCC) 03/11/2020   Leukocytosis    Hemiparesis affecting dominant side as late effect of stroke (HCC)    Labile blood pressure    Sleep disturbance    Benign hypertensive heart and kidney disease with diastolic CHF, NYHA class II and CKD stage III (HCC)    Chronic systolic congestive heart failure (HCC)    Morbid obesity (HCC)    Uncontrolled hypertension    Dysphagia, post-stroke    Cardiomegaly    Acute systolic congestive heart failure (HCC)    Left middle cerebral artery stroke (HCC) 07/17/2019  Cerebral embolism with cerebral infarction 07/11/2019   Acute CVA (cerebrovascular accident) (HCC) 07/11/2019   Slurred speech 07/10/2019   Cardiomyopathy (HCC) 11/20/2018   History of stroke 09/06/2018   Tobacco use 09/06/2018   History of non-ST elevation myocardial infarction (NSTEMI) 05/05/2018   Severe Vitamin D deficiency 04/02/2018   Community acquired pneumonia of left lower lobe of lung 04/02/2018   CKD (chronic kidney disease), stage III (HCC) = Secondary to Hypertension 04/02/2018   Pneumonia 03/30/2018   Metabolic acidosis 03/30/2018   AKI (acute kidney  injury) (HCC) 03/30/2018   N&V (nausea and vomiting) 03/30/2018   Essential hypertension 03/30/2018    Past Surgical History:  Procedure Laterality Date   CORONARY STENT INTERVENTION N/A 05/05/2018   Procedure: CORONARY STENT INTERVENTION;  Surgeon: Marykay Lex, MD;  Location: [facility] INVASIVE CV LAB;  Service: Cardiovascular;  Laterality: N/A;   LEFT HEART CATH AND CORONARY ANGIOGRAPHY N/A 05/05/2018   Procedure: LEFT HEART CATH AND CORONARY ANGIOGRAPHY;  Surgeon: Marykay Lex, MD;  Location: [facility] INVASIVE CV LAB;  Service: Cardiovascular;  Laterality: N/A;   TEE WITHOUT CARDIOVERSION N/A 09/08/2018   Procedure: TRANSESOPHAGEAL ECHOCARDIOGRAM (TEE);  Surgeon: Laurey Morale, MD;  Location: [facility] ENDOSCOPY;  Service: Cardiovascular;  Laterality: N/A;       Family History  Problem Relation Age of Onset   Hypertension Mother    Stroke Mother    Dementia Mother    Hypertension Father    Stroke Father    Cancer Father    Alcoholism Father    Cancer Sister     Social History   Tobacco Use   Smoking status: Former    Types: Cigarettes   Smokeless tobacco: Never   Tobacco comments:    smoked for 10-20 years at 1/2 ppd as of 2019  Vaping Use   Vaping Use: Never used  Substance Use Topics   Alcohol use: Not Currently   Drug use: Yes    Types: Marijuana    Home Medications Prior to Admission medications   Medication Sig Start Date End Date Taking? Authorizing Provider  acetaminophen (TYLENOL) 325 MG tablet Take 2 tablets (650 mg total) by mouth every 6 (six) hours as needed for mild pain (or Fever >/= 101). 03/31/20   Angiulli, Mcarthur Rossetti, PA-C  albuterol (VENTOLIN HFA) 108 (90 Base) MCG/ACT inhaler Inhale 2 puffs into the lungs every 6 (six) hours as needed for wheezing or shortness of breath. Patient not taking: Reported on 07/03/2021 09/04/20   Erick Blinks, MD  amLODipine (NORVASC) 10 MG tablet Take 1 tablet (10 mg total) by mouth daily. 04/05/21   Lilly Cove C, MD   atorvastatin (LIPITOR) 40 MG tablet TAKE 1 TABLET BY MOUTH ONCE DAILY AT  6  PM 04/05/21   Lilly Cove C, MD  carvedilol (COREG) 6.25 MG tablet Take 1 tablet (6.25 mg total) by mouth 2 (two) times daily with a meal. 04/05/21 07/03/21  Lilly Cove C, MD  Cholecalciferol (VITAMIN D-3) 125 MCG (5000 UT) TABS Take 1 tablet by mouth daily. 04/05/21   Wilson Singer, MD  ciprofloxacin (CIPRO) 500 MG tablet Take 1 tablet (500 mg total) by mouth 2 (two) times daily. For 7 days Patient not taking: No sig reported 10/28/20   [name], NP  clopidogrel (PLAVIX) 75 MG tablet Take 1 tablet (75 mg total) by mouth daily. 04/05/21   Wilson Singer, MD  hydrALAZINE (APRESOLINE) 25 MG tablet Take 1 tablet (25 mg total) by mouth  3 (three) times daily. 04/05/21   Wilson Singer, MD  Melatonin 10 MG TABS Take 10 mg by mouth at bedtime. 03/31/20   Angiulli, Mcarthur Rossetti, PA-C  nystatin (MYCOSTATIN/NYSTOP) powder Apply 1 application topically 3 (three) times daily. 10/27/20   [name], NP  senna-docusate (SENOKOT-S) 8.6-50 MG tablet Take 2 tablets by mouth at bedtime. 10/12/19   [name], NP  sildenafil (VIAGRA) 100 MG tablet Take 0.5-1 tablets (50-100 mg total) by mouth daily as needed for erectile dysfunction. Patient not taking: Reported on 07/03/2021 10/17/20   [name], NP    Allergies    Aspirin  Review of Systems   Review of Systems  Constitutional:  Negative for fever.  HENT:  Negative for sore throat.   Eyes:  Negative for visual disturbance.  Respiratory:  Positive for cough. Negative for shortness of breath.   Cardiovascular:  Negative for chest pain.  Gastrointestinal:  Negative for abdominal pain.  Genitourinary:  Positive for hematuria. Negative for dysuria and testicular pain.  Musculoskeletal:  Negative for neck pain.  Skin:  Negative for rash.  Neurological:  Negative for headaches.   Physical Exam Updated Vital Signs BP 133/86   Pulse 77   Temp 98.2 F (36.8 C)  (Oral)   Ht 6' (1.829 m)   Wt 136.1 kg   SpO2 99%   BMI 40.69 kg/m   Physical Exam Vitals and nursing note reviewed.  Constitutional:      Appearance: Normal appearance. He is well-developed. He is obese.  HENT:     Head: Normocephalic and atraumatic.  Eyes:     Conjunctiva/sclera: Conjunctivae normal.  Cardiovascular:     Rate and Rhythm: Normal rate and regular rhythm.     Heart sounds: No murmur heard. Pulmonary:     Effort: Pulmonary effort is normal. No respiratory distress.     Breath sounds: Normal breath sounds.  Abdominal:     Palpations: Abdomen is soft.     Tenderness: There is no abdominal tenderness.  Genitourinary:    Comments: Circumcised penis.  No obvious lesions.  Testes descended and nontender.  No masses appreciated. Musculoskeletal:        General: No deformity or signs of injury. Normal range of motion.     Cervical back: Neck supple.  Skin:    General: Skin is warm and dry.  Neurological:     Mental Status: He is alert. Mental status is at baseline.     Comments: He has some baseline dysarthria along with minimal use of right arm and right leg.  He is usually not ambulatory due to unsafe gait.    ED Results / Procedures / Treatments   Labs (all labs ordered are listed, but only abnormal results are displayed) Labs Reviewed  BASIC METABOLIC PANEL - Abnormal; Notable for the following components:      Result Value   Glucose, Bld 110 (*)    BUN 22 (*)    Creatinine, Ser 1.65 (*)    GFR, Estimated 50 (*)    All other components within normal limits  CBC WITH DIFFERENTIAL/PLATELET - Abnormal; Notable for the following components:   RBC 4.19 (*)    Hemoglobin 12.9 (*)    All other components within normal limits  URINALYSIS, ROUTINE W REFLEX MICROSCOPIC - Abnormal; Notable for the following components:   APPearance CLOUDY (*)    Hgb urine dipstick MODERATE (*)    Protein, ur 30 (*)    Leukocytes,Ua LARGE (*)  RBC / HPF >50 (*)    WBC, UA >50  (*)    Bacteria, UA RARE (*)    All other components within normal limits  URINE CULTURE    EKG None  Radiology DG Chest Port 1 View  Result Date: 09/13/2021 CLINICAL DATA:  Cough EXAM: PORTABLE CHEST 1 VIEW COMPARISON:  09/02/2020 FINDINGS: Better aeration than before, though still low volume chest. There is no edema, consolidation, effusion, or pneumothorax. Chronic cardiomegaly. Saber shape of the trachea may reflect COPD. IMPRESSION: No acute finding. Chronic cardiomegaly Electronically Signed   By: Tiburcio Pea M.D.   On: 09/13/2021 10:11   CT Renal Stone Study  Result Date: 09/13/2021 CLINICAL DATA:  Hematuria, lower abdominal pain EXAM: CT ABDOMEN AND PELVIS WITHOUT CONTRAST TECHNIQUE: Multidetector CT imaging of the abdomen and pelvis was performed following the standard protocol without IV contrast. COMPARISON:  None. FINDINGS: Lower chest: No acute abnormality. Hepatobiliary: No focal hepatic abnormality. Gallbladder unremarkable. Pancreas: No focal abnormality or ductal dilatation. Spleen: No focal abnormality.  Normal size. Adrenals/Urinary Tract: No adrenal abnormality. No focal renal abnormality. No stones or hydronephrosis. Urinary bladder is unremarkable. Stomach/Bowel: Normal appendix. Few scattered colonic diverticula. Stomach and small bowel decompressed. No bowel obstruction. Vascular/Lymphatic: Scattered aortic calcifications. No evidence of aneurysm or adenopathy. Reproductive: No visible focal abnormality. Other: No free fluid or free air. Musculoskeletal: No acute bony abnormality. IMPRESSION: No renal or ureteral stones.  No hydronephrosis. No acute findings in the abdomen or pelvis. Electronically Signed   By: Charlett Nose M.D.   On: 09/13/2021 10:47    Procedures Procedures   Medications Ordered in ED Medications - No data to display  ED Course  I have reviewed the triage vital signs and the nursing notes.  Pertinent labs & imaging results that were available  during my care of the patient were reviewed by me and considered in my medical decision making (see chart for details).  Clinical Course as of 09/13/21 1729  Wed Sep 13, 2021  1017 Chest x-ray interpreted by me as cardiomegaly no acute infiltrates.  Awaiting radiology reading. [MB]  1100 Last patient's urine culture was ESBL sensitive to Bactrim. [MB]    Clinical Course User Index [MB] Terrilee Files, MD   MDM Rules/Calculators/A&P                          This patient complains of dysuria and hematuria; this involves an extensive number of treatment Options and is a complaint that carries with it a high risk of complications and Morbidity. The differential includes UTI, renal stone, obstruction, renal failure  I ordered, reviewed and interpreted labs, which included CBC with normal white count, hemoglobin low stable from priors, renal function with moderate elevation similar or better than priors  I ordered imaging studies which included chest x-ray and CT renal and I independently    visualized and interpreted imaging which showed no acute findings Additional history obtained from patient significant other Previous records obtained and reviewed in epic no recent admissions  After the interventions stated above, I reevaluated the patient and found patient to be hemodynamically stable and appropriate for discharge.  He was noted when sleeping to have desaturations.  Counseled that he needs to talk to his primary care doctor about sleep study consideration for CPAP.   Final Clinical Impression(s) / ED Diagnoses Final diagnoses:  Lower urinary tract infectious disease  Gross hematuria    Rx / DC  Orders ED Discharge Orders          Ordered    sulfamethoxazole-trimethoprim (BACTRIM DS) 800-160 MG tablet  2 times daily        09/13/21 1101             Terrilee Files, MD 09/13/21 1730

## 2021-09-13 NOTE — Discharge Instructions (Addendum)
You were seen in the emergency department for evaluation of blood in your urine and painful urination.  Your urinalysis showed you to have a urinary tract infection.  You had a CAT scan that did not show any kidney stones.  Please finish your antibiotics.  Drink plenty of fluids.  Follow-up with your primary care doctor.  Return to the emergency department if any worsening or concerning symptoms.

## 2021-09-13 NOTE — ED Notes (Signed)
Patient transported to CT 

## 2021-09-13 NOTE — ED Notes (Addendum)
Patient transported to CT 

## 2021-09-13 NOTE — ED Triage Notes (Signed)
Pts girlfriend states this morning pt had blood clots in his urine. Pt c/o of pain w/ urination.

## 2021-09-14 ENCOUNTER — Telehealth: Payer: Self-pay

## 2021-09-14 LAB — URINE CULTURE: Culture: 20000 — AB

## 2021-09-14 NOTE — Telephone Encounter (Signed)
Transition Care Management Unsuccessful Follow-up Telephone Call  Date of discharge and from where:  09/13/2021-[place]  Attempts:  1st Attempt  Reason for unsuccessful TCM follow-up call:  Left voice message

## 2021-09-15 ENCOUNTER — Telehealth: Payer: Self-pay | Admitting: *Deleted

## 2021-09-15 NOTE — Progress Notes (Signed)
ED Antimicrobial Stewardship Positive Culture Follow Up   Zachary Mccann is an 50 y.o. male who presented to [facility] on 09/13/2021 with a chief complaint of  Chief Complaint  Patient presents with   Hematuria    Recent Results (from the past 720 hour(s))  Urine Culture     Status: Abnormal   Collection Time: 09/13/21  9:31 AM   Specimen: Urine, Clean Catch  Result Value Ref Range Status   Specimen Description   Final    URINE, CLEAN CATCH Performed at [facility], [address] [place], [place] [address]    Special Requests   Final    NONE Performed at [facility] [address], [place], [place] [address]    Culture (A)  Final    20,000 COLONIES/mL GROUP B STREP(S.AGALACTIAE)ISOLATED TESTING AGAINST S. AGALACTIAE NOT ROUTINELY PERFORMED DUE TO PREDICTABILITY OF AMP/PEN/VAN SUSCEPTIBILITY. Performed at [facility], [address] [place], [place] [address]    Report Status 09/14/2021 FINAL  Final    50 yo male presented with dysuria, UA not significant for UTI and discharged with 7 day supply of Bactrim DS BID. Urine culture was not significant for UTI.  Plan to stop Bactrim.  ED Provider: Dr. Gloris Manchester, MD   [name] 09/15/2021, 9:21 AM Clinical Pharmacist Monday - Friday phone -  [phone] Saturday - Sunday phone - [phone]

## 2021-09-15 NOTE — Telephone Encounter (Signed)
Post ED Visit - Positive Culture Follow-up  Culture report reviewed by antimicrobial stewardship pharmacist: [facility] []  , Pharm.D. []  Enzo Bi, Pharm.D., BCPS AQ-ID []  , Pharm.D., BCPS []  Celedonio Miyamoto, Pharm.D., BCPS []  [place], Garvin Fila.D., BCPS, AAHIVP []  , Pharm.D., BCPS, AAHIVP []  Georgina Pillion, PharmD, BCPS []  , PharmD, BCPS []  [place], PharmD, BCPS []  [address], PharmD []  , PharmD, BCPS []  Estella Husk, PharmD  Pharmacy Team []  Lysle Pearl, PharmD []  , PharmD []  Phillips Climes, PharmD []  , Rph []  Agapito Games) , PharmD []  Verlan Friends, PharmD []  , PharmD []  Mervyn Gay, PharmD []  , PharmD []  Vinnie Level, PharmD []  [facility], PharmD []  , PharmD []  Len Childs, PharmD   Positive urine culture Treated with Sulfamthhoxazole-TRimethoprim, organism sensitive to the same and no further patient follow-up is required at this time. , PA  Greer Pickerel 09/15/2021, 11:49 AM

## 2021-09-16 DIAGNOSIS — Z419 Encounter for procedure for purposes other than remedying health state, unspecified: Secondary | ICD-10-CM | POA: Diagnosis not present

## 2021-09-18 NOTE — Telephone Encounter (Signed)
Transition Care Management Unsuccessful Follow-up Telephone Call  Date of discharge and from where:  09/13/[facility]  Attempts:  2nd Attempt  Reason for unsuccessful TCM follow-up call:  Left voice message

## 2021-09-19 NOTE — Telephone Encounter (Signed)
Transition Care Management Follow-up Telephone Call Date of discharge and from where: 09/13/2021 from [facility] How have you been since you were released from the hospital? Pt stated that he is feeling better and he nor his care giver had any questions at this time.  Any questions or concerns? No  Items Reviewed: Did the pt receive and understand the discharge instructions provided? Yes  Medications obtained and verified? Yes  Other? No  Any new allergies since your discharge? No  Dietary orders reviewed? No Do you have support at home? Yes   Functional Questionnaire: (I = Independent and D = Dependent) ADLs: I  Bathing/Dressing- I  Meal Prep- I  Eating- I  Maintaining continence- I  Transferring/Ambulation- I  Managing Meds- I   Follow up appointments reviewed:  PCP Hospital f/u appt confirmed? No   Specialist Hospital f/u appt confirmed? No   Are transportation arrangements needed? No  If their condition worsens, is the pt aware to call PCP or go to the Emergency Dept.? Yes Was the patient provided with contact information for the PCP's office or ED? Yes Was to pt encouraged to call back with questions or concerns? Yes

## 2021-10-17 DIAGNOSIS — Z419 Encounter for procedure for purposes other than remedying health state, unspecified: Secondary | ICD-10-CM | POA: Diagnosis not present

## 2021-10-19 DIAGNOSIS — D649 Anemia, unspecified: Secondary | ICD-10-CM | POA: Diagnosis not present

## 2021-10-19 DIAGNOSIS — I10 Essential (primary) hypertension: Secondary | ICD-10-CM | POA: Diagnosis not present

## 2021-10-19 DIAGNOSIS — G8191 Hemiplegia, unspecified affecting right dominant side: Secondary | ICD-10-CM | POA: Diagnosis not present

## 2021-10-19 DIAGNOSIS — I509 Heart failure, unspecified: Secondary | ICD-10-CM | POA: Diagnosis not present

## 2021-10-27 DIAGNOSIS — R809 Proteinuria, unspecified: Secondary | ICD-10-CM | POA: Diagnosis not present

## 2021-10-27 DIAGNOSIS — I10 Essential (primary) hypertension: Secondary | ICD-10-CM | POA: Diagnosis not present

## 2021-10-27 DIAGNOSIS — I5042 Chronic combined systolic (congestive) and diastolic (congestive) heart failure: Secondary | ICD-10-CM | POA: Diagnosis not present

## 2021-10-27 DIAGNOSIS — Z0001 Encounter for general adult medical examination with abnormal findings: Secondary | ICD-10-CM | POA: Diagnosis not present

## 2021-10-27 DIAGNOSIS — N1831 Chronic kidney disease, stage 3a: Secondary | ICD-10-CM | POA: Diagnosis not present

## 2021-10-27 DIAGNOSIS — I129 Hypertensive chronic kidney disease with stage 1 through stage 4 chronic kidney disease, or unspecified chronic kidney disease: Secondary | ICD-10-CM | POA: Diagnosis not present

## 2021-10-27 DIAGNOSIS — D638 Anemia in other chronic diseases classified elsewhere: Secondary | ICD-10-CM | POA: Diagnosis not present

## 2021-10-27 DIAGNOSIS — R768 Other specified abnormal immunological findings in serum: Secondary | ICD-10-CM | POA: Diagnosis not present

## 2021-11-02 DIAGNOSIS — I129 Hypertensive chronic kidney disease with stage 1 through stage 4 chronic kidney disease, or unspecified chronic kidney disease: Secondary | ICD-10-CM | POA: Diagnosis not present

## 2021-11-02 DIAGNOSIS — N1831 Chronic kidney disease, stage 3a: Secondary | ICD-10-CM | POA: Diagnosis not present

## 2021-11-02 DIAGNOSIS — I5042 Chronic combined systolic (congestive) and diastolic (congestive) heart failure: Secondary | ICD-10-CM | POA: Diagnosis not present

## 2021-11-02 DIAGNOSIS — R809 Proteinuria, unspecified: Secondary | ICD-10-CM | POA: Diagnosis not present

## 2021-11-02 DIAGNOSIS — R768 Other specified abnormal immunological findings in serum: Secondary | ICD-10-CM | POA: Diagnosis not present

## 2021-11-02 DIAGNOSIS — D638 Anemia in other chronic diseases classified elsewhere: Secondary | ICD-10-CM | POA: Diagnosis not present

## 2021-11-16 DIAGNOSIS — Z419 Encounter for procedure for purposes other than remedying health state, unspecified: Secondary | ICD-10-CM | POA: Diagnosis not present

## 2021-11-21 DIAGNOSIS — I509 Heart failure, unspecified: Secondary | ICD-10-CM | POA: Diagnosis not present

## 2021-11-21 DIAGNOSIS — G8191 Hemiplegia, unspecified affecting right dominant side: Secondary | ICD-10-CM | POA: Diagnosis not present

## 2021-11-21 DIAGNOSIS — I10 Essential (primary) hypertension: Secondary | ICD-10-CM | POA: Diagnosis not present

## 2021-11-21 DIAGNOSIS — Z0001 Encounter for general adult medical examination with abnormal findings: Secondary | ICD-10-CM | POA: Diagnosis not present

## 2021-11-28 ENCOUNTER — Encounter: Payer: Self-pay | Admitting: Internal Medicine

## 2021-12-17 DIAGNOSIS — Z419 Encounter for procedure for purposes other than remedying health state, unspecified: Secondary | ICD-10-CM | POA: Diagnosis not present

## 2022-01-04 ENCOUNTER — Ambulatory Visit: Payer: Medicaid Other | Admitting: Internal Medicine

## 2022-01-17 DIAGNOSIS — Z419 Encounter for procedure for purposes other than remedying health state, unspecified: Secondary | ICD-10-CM | POA: Diagnosis not present

## 2022-01-24 ENCOUNTER — Ambulatory Visit: Payer: Medicaid Other | Admitting: Internal Medicine

## 2022-02-14 DIAGNOSIS — Z419 Encounter for procedure for purposes other than remedying health state, unspecified: Secondary | ICD-10-CM | POA: Diagnosis not present

## 2022-03-06 DIAGNOSIS — Z7902 Long term (current) use of antithrombotics/antiplatelets: Secondary | ICD-10-CM | POA: Diagnosis not present

## 2022-03-06 DIAGNOSIS — G459 Transient cerebral ischemic attack, unspecified: Secondary | ICD-10-CM | POA: Diagnosis not present

## 2022-03-06 DIAGNOSIS — Z20822 Contact with and (suspected) exposure to covid-19: Secondary | ICD-10-CM | POA: Diagnosis not present

## 2022-03-06 DIAGNOSIS — Z6841 Body Mass Index (BMI) 40.0 and over, adult: Secondary | ICD-10-CM | POA: Diagnosis not present

## 2022-03-06 DIAGNOSIS — E785 Hyperlipidemia, unspecified: Secondary | ICD-10-CM | POA: Diagnosis not present

## 2022-03-06 DIAGNOSIS — I63232 Cerebral infarction due to unspecified occlusion or stenosis of left carotid arteries: Secondary | ICD-10-CM | POA: Diagnosis not present

## 2022-03-06 DIAGNOSIS — Z886 Allergy status to analgesic agent status: Secondary | ICD-10-CM | POA: Diagnosis not present

## 2022-03-06 DIAGNOSIS — I130 Hypertensive heart and chronic kidney disease with heart failure and stage 1 through stage 4 chronic kidney disease, or unspecified chronic kidney disease: Secondary | ICD-10-CM | POA: Diagnosis not present

## 2022-03-06 DIAGNOSIS — I69320 Aphasia following cerebral infarction: Secondary | ICD-10-CM | POA: Diagnosis not present

## 2022-03-06 DIAGNOSIS — G8191 Hemiplegia, unspecified affecting right dominant side: Secondary | ICD-10-CM | POA: Diagnosis not present

## 2022-03-06 DIAGNOSIS — I509 Heart failure, unspecified: Secondary | ICD-10-CM | POA: Diagnosis not present

## 2022-03-06 DIAGNOSIS — N189 Chronic kidney disease, unspecified: Secondary | ICD-10-CM | POA: Diagnosis not present

## 2022-03-17 DIAGNOSIS — Z419 Encounter for procedure for purposes other than remedying health state, unspecified: Secondary | ICD-10-CM | POA: Diagnosis not present

## 2022-04-16 DIAGNOSIS — Z419 Encounter for procedure for purposes other than remedying health state, unspecified: Secondary | ICD-10-CM | POA: Diagnosis not present

## 2022-05-17 DIAGNOSIS — Z419 Encounter for procedure for purposes other than remedying health state, unspecified: Secondary | ICD-10-CM | POA: Diagnosis not present

## 2022-06-16 DIAGNOSIS — Z419 Encounter for procedure for purposes other than remedying health state, unspecified: Secondary | ICD-10-CM | POA: Diagnosis not present

## 2022-07-17 DIAGNOSIS — Z419 Encounter for procedure for purposes other than remedying health state, unspecified: Secondary | ICD-10-CM | POA: Diagnosis not present

## 2022-08-17 DIAGNOSIS — Z419 Encounter for procedure for purposes other than remedying health state, unspecified: Secondary | ICD-10-CM | POA: Diagnosis not present

## 2022-09-16 DIAGNOSIS — Z419 Encounter for procedure for purposes other than remedying health state, unspecified: Secondary | ICD-10-CM | POA: Diagnosis not present

## 2022-10-17 DIAGNOSIS — Z419 Encounter for procedure for purposes other than remedying health state, unspecified: Secondary | ICD-10-CM | POA: Diagnosis not present

## 2022-11-16 DIAGNOSIS — Z419 Encounter for procedure for purposes other than remedying health state, unspecified: Secondary | ICD-10-CM | POA: Diagnosis not present

## 2022-12-17 DIAGNOSIS — Z419 Encounter for procedure for purposes other than remedying health state, unspecified: Secondary | ICD-10-CM | POA: Diagnosis not present

## 2023-01-17 DIAGNOSIS — Z419 Encounter for procedure for purposes other than remedying health state, unspecified: Secondary | ICD-10-CM | POA: Diagnosis not present

## 2023-02-15 DIAGNOSIS — Z419 Encounter for procedure for purposes other than remedying health state, unspecified: Secondary | ICD-10-CM | POA: Diagnosis not present

## 2023-03-18 DIAGNOSIS — Z419 Encounter for procedure for purposes other than remedying health state, unspecified: Secondary | ICD-10-CM | POA: Diagnosis not present

## 2023-04-17 DIAGNOSIS — Z419 Encounter for procedure for purposes other than remedying health state, unspecified: Secondary | ICD-10-CM | POA: Diagnosis not present

## 2024-02-26 ENCOUNTER — Telehealth: Payer: Self-pay | Admitting: *Deleted

## 2024-02-26 NOTE — Patient Outreach (Signed)
  Care Management   Note  02/26/2024 Name: Zachary Mccann MRN: [id number] DOB: 22-Sep-1971  [name] is enrolled in a Managed Medicaid plan: Yes. Outreach attempt today was unsuccessful.   The care management team will reach out to the patient again over the next 7 days.   [name] RN, BSN [place]  [facility] RN Care Manager [phone]

## 2024-02-27 ENCOUNTER — Telehealth: Payer: Self-pay | Admitting: *Deleted

## 2024-02-27 NOTE — Patient Outreach (Signed)
  Care Management   Note  02/27/2024 Name: Zachary Mccann MRN: [id number] DOB: [birth year]  Zachary Mccann is enrolled in a Managed Medicaid plan: Yes. Outreach attempt today was unsuccessful.   The care management team will reach out to the patient again over the next 7 days.   [name] RN, BSN [place]  [facility] Health RN Care Manager [phone]

## 2024-03-02 ENCOUNTER — Telehealth: Payer: Self-pay | Admitting: *Deleted

## 2024-03-02 NOTE — Patient Outreach (Signed)
  Medicaid Managed Care   Unsuccessful Outreach Note  03/02/2024 Name: Zachary Mccann MRN: [id number] DOB: 1971/09/23  Referred by: Benetta Spar, MD Reason for referral : High Risk Managed Medicaid (Unsuccessful RNCM telephone outreach)   Third unsuccessful telephone outreach was attempted today. The patient was referred to the case management team for assistance with care management and care coordination. The patient's primary care provider has been notified of our unsuccessful attempts to make or maintain contact with the patient. The care management team is pleased to engage with this patient at any time in the future should he/she be interested in assistance from the care management team.   Follow Up Plan: We have been unable to make contact with the patient for follow up. The care management team is available to follow up with the patient after provider conversation with the patient regarding recommendation for care management engagement and subsequent re-referral to the care management team.   [name] RN, BSN [place]  [facility] Health RN Care Manager [phone]

## 2024-03-04 ENCOUNTER — Encounter: Payer: Self-pay | Admitting: Cardiology
